# Patient Record
Sex: Female | Born: 1945
Health system: Southern US, Community
[De-identification: ages and names within clinical notes are randomized; demographics above are authoritative.]

## PROBLEM LIST (undated history)

## (undated) DIAGNOSIS — M4800 Spinal stenosis, site unspecified: Secondary | ICD-10-CM

## (undated) DIAGNOSIS — J349 Unspecified disorder of nose and nasal sinuses: Secondary | ICD-10-CM

## (undated) DIAGNOSIS — F419 Anxiety disorder, unspecified: Secondary | ICD-10-CM

## (undated) DIAGNOSIS — E119 Type 2 diabetes mellitus without complications: Secondary | ICD-10-CM

## (undated) DIAGNOSIS — R079 Chest pain, unspecified: Secondary | ICD-10-CM

## (undated) DIAGNOSIS — M797 Fibromyalgia: Secondary | ICD-10-CM

## (undated) DIAGNOSIS — F32A Depression, unspecified: Secondary | ICD-10-CM

## (undated) DIAGNOSIS — J449 Chronic obstructive pulmonary disease, unspecified: Secondary | ICD-10-CM

## (undated) DIAGNOSIS — I509 Heart failure, unspecified: Secondary | ICD-10-CM

## (undated) DIAGNOSIS — E0500 Thyrotoxicosis with diffuse goiter without thyrotoxic crisis or storm: Secondary | ICD-10-CM

## (undated) DIAGNOSIS — I10 Essential (primary) hypertension: Secondary | ICD-10-CM

## (undated) DIAGNOSIS — F4310 Post-traumatic stress disorder, unspecified: Secondary | ICD-10-CM

## (undated) DIAGNOSIS — K589 Irritable bowel syndrome without diarrhea: Secondary | ICD-10-CM

## (undated) DIAGNOSIS — F329 Major depressive disorder, single episode, unspecified: Secondary | ICD-10-CM

## (undated) HISTORY — DX: Major depressive disorder, single episode, unspecified: F32.9

## (undated) HISTORY — DX: Chest pain, unspecified: R07.9

## (undated) HISTORY — DX: Essential (primary) hypertension: I10

## (undated) HISTORY — DX: Spinal stenosis, site unspecified: M48.00

## (undated) HISTORY — DX: Heart failure, unspecified: I50.9

## (undated) HISTORY — DX: Post-traumatic stress disorder, unspecified: F43.10

## (undated) HISTORY — PX: TONSILLECTOMY AND ADENOIDECTOMY: SHX28

## (undated) HISTORY — DX: Depression, unspecified: F32.A

## (undated) HISTORY — DX: Irritable bowel syndrome, unspecified: K58.9

## (undated) HISTORY — DX: Anxiety disorder, unspecified: F41.9

## (undated) HISTORY — PX: ABDOMINAL HYSTERECTOMY: SHX81

## (undated) HISTORY — PX: RECTAL SURGERY: SHX760

## (undated) HISTORY — DX: Type 2 diabetes mellitus without complications: E11.9

## (undated) HISTORY — DX: Fibromyalgia: M79.7

## (undated) HISTORY — DX: Chronic obstructive pulmonary disease, unspecified: J44.9

## (undated) HISTORY — DX: Thyrotoxicosis with diffuse goiter without thyrotoxic crisis or storm: E05.00

## (undated) HISTORY — DX: Unspecified disorder of nose and nasal sinuses: J34.9

## (undated) HISTORY — PX: OTHER SURGICAL HISTORY: SHX169

---

## 2004-11-04 ENCOUNTER — Ambulatory Visit: Payer: Self-pay

## 2004-12-16 ENCOUNTER — Ambulatory Visit: Payer: Self-pay

## 2004-12-19 ENCOUNTER — Ambulatory Visit: Payer: Self-pay | Admitting: Gastroenterology

## 2004-12-21 ENCOUNTER — Emergency Department: Payer: Self-pay | Admitting: Emergency Medicine

## 2005-02-15 ENCOUNTER — Ambulatory Visit: Payer: Self-pay | Admitting: Internal Medicine

## 2005-04-28 ENCOUNTER — Ambulatory Visit: Payer: Self-pay | Admitting: Pain Medicine

## 2005-05-01 ENCOUNTER — Ambulatory Visit: Payer: Self-pay | Admitting: Pain Medicine

## 2005-05-04 ENCOUNTER — Ambulatory Visit: Payer: Self-pay | Admitting: Pain Medicine

## 2005-05-06 ENCOUNTER — Ambulatory Visit: Payer: Self-pay | Admitting: Pain Medicine

## 2005-05-25 ENCOUNTER — Ambulatory Visit: Payer: Self-pay | Admitting: Pain Medicine

## 2005-06-09 ENCOUNTER — Ambulatory Visit: Payer: Self-pay | Admitting: Internal Medicine

## 2005-06-23 ENCOUNTER — Ambulatory Visit: Payer: Self-pay | Admitting: Pain Medicine

## 2005-07-06 ENCOUNTER — Ambulatory Visit: Payer: Self-pay | Admitting: Pain Medicine

## 2005-07-21 ENCOUNTER — Ambulatory Visit: Payer: Self-pay | Admitting: Pain Medicine

## 2005-07-22 ENCOUNTER — Ambulatory Visit: Payer: Self-pay | Admitting: Pain Medicine

## 2005-08-18 ENCOUNTER — Ambulatory Visit: Payer: Self-pay | Admitting: Pain Medicine

## 2005-08-26 ENCOUNTER — Ambulatory Visit: Payer: Self-pay | Admitting: Pain Medicine

## 2005-09-03 ENCOUNTER — Encounter: Payer: Self-pay | Admitting: Internal Medicine

## 2005-09-15 ENCOUNTER — Encounter: Payer: Self-pay | Admitting: Internal Medicine

## 2005-09-17 ENCOUNTER — Ambulatory Visit: Payer: Self-pay | Admitting: Pain Medicine

## 2005-09-23 ENCOUNTER — Ambulatory Visit: Payer: Self-pay | Admitting: Pain Medicine

## 2005-10-13 ENCOUNTER — Ambulatory Visit: Payer: Self-pay | Admitting: Pain Medicine

## 2005-10-16 ENCOUNTER — Encounter: Payer: Self-pay | Admitting: Internal Medicine

## 2005-10-21 ENCOUNTER — Ambulatory Visit: Payer: Self-pay | Admitting: Pain Medicine

## 2005-10-27 ENCOUNTER — Ambulatory Visit: Payer: Self-pay

## 2005-10-28 ENCOUNTER — Ambulatory Visit: Payer: Self-pay | Admitting: Internal Medicine

## 2005-11-04 ENCOUNTER — Ambulatory Visit: Payer: Self-pay | Admitting: Internal Medicine

## 2005-11-10 ENCOUNTER — Ambulatory Visit: Payer: Self-pay | Admitting: Pain Medicine

## 2005-11-25 ENCOUNTER — Ambulatory Visit: Payer: Self-pay | Admitting: Pain Medicine

## 2005-12-03 ENCOUNTER — Ambulatory Visit: Payer: Self-pay

## 2005-12-16 ENCOUNTER — Ambulatory Visit: Payer: Self-pay

## 2005-12-29 ENCOUNTER — Ambulatory Visit: Payer: Self-pay | Admitting: Pain Medicine

## 2005-12-31 ENCOUNTER — Ambulatory Visit: Payer: Self-pay | Admitting: Internal Medicine

## 2006-01-11 ENCOUNTER — Ambulatory Visit: Payer: Self-pay | Admitting: Pain Medicine

## 2006-01-26 ENCOUNTER — Ambulatory Visit: Payer: Self-pay | Admitting: Pain Medicine

## 2006-02-03 ENCOUNTER — Ambulatory Visit: Payer: Self-pay | Admitting: Pain Medicine

## 2006-02-25 ENCOUNTER — Ambulatory Visit: Payer: Self-pay | Admitting: Pain Medicine

## 2006-03-08 ENCOUNTER — Ambulatory Visit: Payer: Self-pay | Admitting: Pain Medicine

## 2006-03-30 ENCOUNTER — Ambulatory Visit: Payer: Self-pay | Admitting: Pain Medicine

## 2006-04-12 ENCOUNTER — Ambulatory Visit: Payer: Self-pay | Admitting: Pain Medicine

## 2006-04-13 ENCOUNTER — Ambulatory Visit: Payer: Self-pay | Admitting: Gastroenterology

## 2006-04-27 ENCOUNTER — Ambulatory Visit: Payer: Self-pay | Admitting: Pain Medicine

## 2006-05-05 ENCOUNTER — Ambulatory Visit: Payer: Self-pay | Admitting: Pain Medicine

## 2006-05-27 ENCOUNTER — Ambulatory Visit: Payer: Self-pay | Admitting: Pain Medicine

## 2006-06-02 ENCOUNTER — Ambulatory Visit: Payer: Self-pay | Admitting: Pain Medicine

## 2006-06-24 ENCOUNTER — Ambulatory Visit: Payer: Self-pay | Admitting: Pain Medicine

## 2006-07-05 ENCOUNTER — Ambulatory Visit: Payer: Self-pay | Admitting: Pain Medicine

## 2006-07-13 ENCOUNTER — Ambulatory Visit: Payer: Self-pay | Admitting: Internal Medicine

## 2006-07-22 ENCOUNTER — Ambulatory Visit: Payer: Self-pay | Admitting: Pain Medicine

## 2006-08-02 ENCOUNTER — Ambulatory Visit: Payer: Self-pay | Admitting: Pain Medicine

## 2006-08-26 ENCOUNTER — Ambulatory Visit: Payer: Self-pay | Admitting: Pain Medicine

## 2006-09-01 ENCOUNTER — Ambulatory Visit: Payer: Self-pay | Admitting: Pain Medicine

## 2006-09-23 ENCOUNTER — Ambulatory Visit: Payer: Self-pay | Admitting: Pain Medicine

## 2006-10-06 ENCOUNTER — Emergency Department: Payer: Self-pay | Admitting: Emergency Medicine

## 2006-10-06 ENCOUNTER — Ambulatory Visit: Payer: Self-pay | Admitting: Pain Medicine

## 2006-10-06 ENCOUNTER — Other Ambulatory Visit: Payer: Self-pay

## 2006-10-20 ENCOUNTER — Ambulatory Visit: Payer: Self-pay | Admitting: Pain Medicine

## 2006-10-28 ENCOUNTER — Inpatient Hospital Stay: Payer: Self-pay | Admitting: Internal Medicine

## 2006-11-18 ENCOUNTER — Ambulatory Visit: Payer: Self-pay | Admitting: Pain Medicine

## 2006-11-22 ENCOUNTER — Ambulatory Visit: Payer: Self-pay | Admitting: Pain Medicine

## 2006-11-25 ENCOUNTER — Other Ambulatory Visit: Payer: Self-pay

## 2006-11-25 ENCOUNTER — Inpatient Hospital Stay: Payer: Self-pay | Admitting: Internal Medicine

## 2006-12-21 ENCOUNTER — Ambulatory Visit: Payer: Self-pay | Admitting: Pain Medicine

## 2006-12-29 ENCOUNTER — Ambulatory Visit: Payer: Self-pay | Admitting: Pain Medicine

## 2007-01-19 ENCOUNTER — Ambulatory Visit: Payer: Self-pay | Admitting: Pain Medicine

## 2007-02-14 ENCOUNTER — Ambulatory Visit: Payer: Self-pay | Admitting: Pain Medicine

## 2007-06-09 ENCOUNTER — Ambulatory Visit: Payer: Self-pay | Admitting: Pain Medicine

## 2007-06-20 ENCOUNTER — Ambulatory Visit: Payer: Self-pay | Admitting: Pain Medicine

## 2007-07-26 ENCOUNTER — Ambulatory Visit: Payer: Self-pay | Admitting: Pain Medicine

## 2007-08-01 ENCOUNTER — Ambulatory Visit: Payer: Self-pay | Admitting: Pain Medicine

## 2007-08-24 ENCOUNTER — Other Ambulatory Visit: Payer: Self-pay

## 2007-08-24 ENCOUNTER — Inpatient Hospital Stay: Payer: Self-pay | Admitting: Internal Medicine

## 2007-10-04 ENCOUNTER — Ambulatory Visit: Payer: Self-pay | Admitting: Pain Medicine

## 2007-10-17 ENCOUNTER — Ambulatory Visit: Payer: Self-pay | Admitting: Pain Medicine

## 2007-10-26 ENCOUNTER — Ambulatory Visit: Payer: Self-pay | Admitting: Pain Medicine

## 2007-11-02 ENCOUNTER — Ambulatory Visit: Payer: Self-pay | Admitting: Pain Medicine

## 2007-12-06 ENCOUNTER — Ambulatory Visit: Payer: Self-pay | Admitting: Pain Medicine

## 2007-12-29 ENCOUNTER — Ambulatory Visit: Payer: Self-pay | Admitting: Pain Medicine

## 2008-01-04 ENCOUNTER — Ambulatory Visit: Payer: Self-pay | Admitting: Pain Medicine

## 2008-01-31 ENCOUNTER — Ambulatory Visit: Payer: Self-pay | Admitting: Pain Medicine

## 2008-02-08 ENCOUNTER — Ambulatory Visit: Payer: Self-pay | Admitting: Pain Medicine

## 2008-03-01 ENCOUNTER — Ambulatory Visit: Payer: Self-pay | Admitting: Pain Medicine

## 2008-03-21 ENCOUNTER — Ambulatory Visit: Payer: Self-pay | Admitting: Pain Medicine

## 2008-04-03 ENCOUNTER — Emergency Department: Payer: Self-pay | Admitting: Emergency Medicine

## 2008-04-19 ENCOUNTER — Ambulatory Visit: Payer: Self-pay | Admitting: Pain Medicine

## 2008-04-25 ENCOUNTER — Ambulatory Visit: Payer: Self-pay | Admitting: Pain Medicine

## 2008-05-24 ENCOUNTER — Ambulatory Visit: Payer: Self-pay | Admitting: Pain Medicine

## 2008-05-30 ENCOUNTER — Ambulatory Visit: Payer: Self-pay | Admitting: Pain Medicine

## 2008-06-19 ENCOUNTER — Ambulatory Visit: Payer: Self-pay | Admitting: Pain Medicine

## 2008-07-04 ENCOUNTER — Ambulatory Visit: Payer: Self-pay | Admitting: Pain Medicine

## 2008-07-16 ENCOUNTER — Ambulatory Visit: Payer: Self-pay | Admitting: Pain Medicine

## 2008-08-22 ENCOUNTER — Ambulatory Visit: Payer: Self-pay | Admitting: Pain Medicine

## 2008-09-18 ENCOUNTER — Ambulatory Visit: Payer: Self-pay | Admitting: Pain Medicine

## 2008-09-24 ENCOUNTER — Ambulatory Visit: Payer: Self-pay | Admitting: Pain Medicine

## 2008-10-16 ENCOUNTER — Ambulatory Visit: Payer: Self-pay | Admitting: Pain Medicine

## 2008-11-15 ENCOUNTER — Ambulatory Visit: Payer: Self-pay | Admitting: Pain Medicine

## 2008-11-23 ENCOUNTER — Ambulatory Visit: Payer: Self-pay | Admitting: Internal Medicine

## 2008-12-11 ENCOUNTER — Ambulatory Visit: Payer: Self-pay | Admitting: Pain Medicine

## 2009-01-10 ENCOUNTER — Ambulatory Visit: Payer: Self-pay | Admitting: Pain Medicine

## 2009-01-14 ENCOUNTER — Ambulatory Visit: Payer: Self-pay | Admitting: Pain Medicine

## 2009-02-05 ENCOUNTER — Ambulatory Visit: Payer: Self-pay | Admitting: Pain Medicine

## 2009-02-08 ENCOUNTER — Emergency Department: Payer: Self-pay | Admitting: Emergency Medicine

## 2009-03-18 ENCOUNTER — Ambulatory Visit: Payer: Self-pay | Admitting: Pain Medicine

## 2009-03-27 ENCOUNTER — Ambulatory Visit: Payer: Self-pay | Admitting: Pain Medicine

## 2009-04-18 ENCOUNTER — Ambulatory Visit: Payer: Self-pay | Admitting: Pain Medicine

## 2009-05-16 ENCOUNTER — Ambulatory Visit: Payer: Self-pay | Admitting: Pain Medicine

## 2009-06-05 ENCOUNTER — Ambulatory Visit: Payer: Self-pay | Admitting: Pain Medicine

## 2009-06-17 ENCOUNTER — Ambulatory Visit: Payer: Self-pay | Admitting: Pain Medicine

## 2009-07-18 ENCOUNTER — Ambulatory Visit: Payer: Self-pay | Admitting: Pain Medicine

## 2009-08-15 ENCOUNTER — Ambulatory Visit: Payer: Self-pay | Admitting: Pain Medicine

## 2009-08-26 ENCOUNTER — Ambulatory Visit: Payer: Self-pay | Admitting: Pain Medicine

## 2009-09-12 ENCOUNTER — Ambulatory Visit: Payer: Self-pay | Admitting: Pain Medicine

## 2009-09-18 ENCOUNTER — Ambulatory Visit: Payer: Self-pay | Admitting: Pain Medicine

## 2009-10-10 ENCOUNTER — Ambulatory Visit: Payer: Self-pay | Admitting: Pain Medicine

## 2009-11-12 ENCOUNTER — Ambulatory Visit: Payer: Self-pay | Admitting: Pain Medicine

## 2009-11-25 ENCOUNTER — Ambulatory Visit: Payer: Self-pay | Admitting: Pain Medicine

## 2009-12-10 ENCOUNTER — Ambulatory Visit: Payer: Self-pay | Admitting: Pain Medicine

## 2010-01-09 ENCOUNTER — Ambulatory Visit: Payer: Self-pay | Admitting: Pain Medicine

## 2010-02-06 ENCOUNTER — Ambulatory Visit: Payer: Self-pay | Admitting: Pain Medicine

## 2010-02-12 ENCOUNTER — Ambulatory Visit: Payer: Self-pay | Admitting: Pain Medicine

## 2010-02-17 ENCOUNTER — Ambulatory Visit: Payer: Self-pay | Admitting: Pain Medicine

## 2010-03-06 ENCOUNTER — Ambulatory Visit: Payer: Self-pay | Admitting: Pain Medicine

## 2010-03-12 ENCOUNTER — Ambulatory Visit: Payer: Self-pay | Admitting: Pain Medicine

## 2010-04-08 ENCOUNTER — Ambulatory Visit: Payer: Self-pay | Admitting: Pain Medicine

## 2010-05-06 ENCOUNTER — Ambulatory Visit: Payer: Self-pay | Admitting: Pain Medicine

## 2010-06-05 ENCOUNTER — Ambulatory Visit: Payer: Self-pay | Admitting: Pain Medicine

## 2010-07-08 ENCOUNTER — Ambulatory Visit: Payer: Self-pay | Admitting: Pain Medicine

## 2010-08-05 ENCOUNTER — Ambulatory Visit: Payer: Self-pay | Admitting: Pain Medicine

## 2010-08-27 ENCOUNTER — Ambulatory Visit: Payer: Self-pay | Admitting: Pain Medicine

## 2010-09-04 ENCOUNTER — Ambulatory Visit: Payer: Self-pay | Admitting: Pain Medicine

## 2010-09-19 ENCOUNTER — Ambulatory Visit: Payer: Self-pay | Admitting: Internal Medicine

## 2010-10-06 ENCOUNTER — Ambulatory Visit: Payer: Self-pay | Admitting: Pain Medicine

## 2010-11-04 ENCOUNTER — Ambulatory Visit: Payer: Self-pay | Admitting: Pain Medicine

## 2010-12-03 ENCOUNTER — Ambulatory Visit: Payer: Self-pay | Admitting: Pain Medicine

## 2011-01-01 ENCOUNTER — Ambulatory Visit: Payer: Self-pay | Admitting: Pain Medicine

## 2011-02-03 ENCOUNTER — Ambulatory Visit: Payer: Self-pay | Admitting: Pain Medicine

## 2011-03-05 ENCOUNTER — Ambulatory Visit: Payer: Self-pay | Admitting: Pain Medicine

## 2011-03-16 ENCOUNTER — Ambulatory Visit: Payer: Self-pay | Admitting: Pain Medicine

## 2011-04-02 ENCOUNTER — Ambulatory Visit: Payer: Self-pay | Admitting: Pain Medicine

## 2011-04-23 ENCOUNTER — Ambulatory Visit: Payer: Self-pay | Admitting: Pain Medicine

## 2011-06-02 ENCOUNTER — Ambulatory Visit: Payer: Self-pay | Admitting: Pain Medicine

## 2011-06-08 ENCOUNTER — Ambulatory Visit: Payer: Self-pay | Admitting: Pain Medicine

## 2011-06-30 ENCOUNTER — Ambulatory Visit: Payer: Self-pay | Admitting: Pain Medicine

## 2011-07-30 ENCOUNTER — Ambulatory Visit: Payer: Self-pay | Admitting: Pain Medicine

## 2011-09-01 ENCOUNTER — Ambulatory Visit: Payer: Self-pay | Admitting: Pain Medicine

## 2011-09-02 ENCOUNTER — Ambulatory Visit: Payer: Self-pay | Admitting: Internal Medicine

## 2011-10-01 ENCOUNTER — Ambulatory Visit: Payer: Self-pay | Admitting: Pain Medicine

## 2011-10-14 ENCOUNTER — Ambulatory Visit: Payer: Self-pay | Admitting: Pain Medicine

## 2011-11-03 ENCOUNTER — Ambulatory Visit: Payer: Self-pay | Admitting: Pain Medicine

## 2011-12-03 ENCOUNTER — Ambulatory Visit: Payer: Self-pay | Admitting: Pain Medicine

## 2011-12-31 ENCOUNTER — Ambulatory Visit: Payer: Self-pay | Admitting: Pain Medicine

## 2012-01-28 ENCOUNTER — Ambulatory Visit: Payer: Self-pay | Admitting: Pain Medicine

## 2012-02-17 ENCOUNTER — Ambulatory Visit: Payer: Self-pay | Admitting: Pain Medicine

## 2012-03-01 ENCOUNTER — Ambulatory Visit: Payer: Self-pay | Admitting: Pain Medicine

## 2012-03-14 ENCOUNTER — Ambulatory Visit: Payer: Self-pay | Admitting: Pain Medicine

## 2012-03-31 ENCOUNTER — Ambulatory Visit: Payer: Self-pay | Admitting: Pain Medicine

## 2012-05-03 ENCOUNTER — Ambulatory Visit: Payer: Self-pay | Admitting: Pain Medicine

## 2012-05-12 LAB — COMPREHENSIVE METABOLIC PANEL
Albumin: 3.5 g/dL (ref 3.4–5.0)
Anion Gap: 5 — ABNORMAL LOW (ref 7–16)
Bilirubin,Total: 0.1 mg/dL — ABNORMAL LOW (ref 0.2–1.0)
Calcium, Total: 8.4 mg/dL — ABNORMAL LOW (ref 8.5–10.1)
Creatinine: 0.91 mg/dL (ref 0.60–1.30)
EGFR (African American): >60
Glucose: 94 mg/dL (ref 65–99)
Osmolality: 276 (ref 275–301)
SGOT(AST): 25 U/L (ref 15–37)

## 2012-05-12 LAB — CBC
MCH: 31.6 pg (ref 26.0–34.0)
MCV: 96 fL (ref 80–100)
Platelet: 119 10*3/uL — ABNORMAL LOW (ref 150–440)
RDW: 14 % (ref 11.5–14.5)
WBC: 4.7 10*3/uL (ref 3.6–11.0)

## 2012-05-13 ENCOUNTER — Inpatient Hospital Stay: Payer: Self-pay | Admitting: Internal Medicine

## 2012-05-13 LAB — URINALYSIS, COMPLETE
Bilirubin,UR: NEGATIVE
Nitrite: NEGATIVE
Ph: 5 (ref 4.5–8.0)
Protein: NEGATIVE
RBC,UR: 1 /HPF (ref 0–5)
Specific Gravity: 1.024 (ref 1.003–1.030)

## 2012-05-14 LAB — CBC WITH DIFFERENTIAL/PLATELET
Basophil %: 0.1 %
Eosinophil %: 0 %
Lymphocyte #: 0.6 10*3/uL — ABNORMAL LOW (ref 1.0–3.6)
MCH: 34.1 pg — ABNORMAL HIGH (ref 26.0–34.0)
MCV: 94 fL (ref 80–100)
Monocyte #: 0.3 x10 3/mm (ref 0.2–0.9)
Monocyte %: 6 %
Neutrophil #: 3.7 10*3/uL (ref 1.4–6.5)
Platelet: 119 10*3/uL — ABNORMAL LOW (ref 150–440)
WBC: 4.6 10*3/uL (ref 3.6–11.0)

## 2012-05-14 LAB — BASIC METABOLIC PANEL
Anion Gap: 8 (ref 7–16)
Calcium, Total: 9 mg/dL (ref 8.5–10.1)
Chloride: 104 mmol/L (ref 98–107)
Co2: 25 mmol/L (ref 21–32)
EGFR (African American): >60

## 2012-05-17 LAB — BASIC METABOLIC PANEL
Calcium, Total: 8.9 mg/dL (ref 8.5–10.1)
Chloride: 104 mmol/L (ref 98–107)
Co2: 25 mmol/L (ref 21–32)
Creatinine: 0.98 mg/dL (ref 0.60–1.30)
Osmolality: 279 (ref 275–301)
Potassium: 3.5 mmol/L (ref 3.5–5.1)
Sodium: 137 mmol/L (ref 136–145)

## 2012-05-18 LAB — PLATELET COUNT: Platelet: 182 10*3/uL (ref 150–440)

## 2012-06-07 ENCOUNTER — Ambulatory Visit: Payer: Self-pay | Admitting: Pain Medicine

## 2012-07-07 ENCOUNTER — Ambulatory Visit: Payer: Self-pay | Admitting: Pain Medicine

## 2012-07-20 ENCOUNTER — Ambulatory Visit: Payer: Self-pay | Admitting: Pain Medicine

## 2012-08-04 ENCOUNTER — Ambulatory Visit: Payer: Self-pay | Admitting: Pain Medicine

## 2012-08-30 ENCOUNTER — Ambulatory Visit: Payer: Self-pay | Admitting: Pain Medicine

## 2012-09-29 ENCOUNTER — Ambulatory Visit: Payer: Self-pay | Admitting: Pain Medicine

## 2012-11-01 ENCOUNTER — Ambulatory Visit: Payer: Self-pay | Admitting: Pain Medicine

## 2012-11-29 ENCOUNTER — Ambulatory Visit: Payer: Self-pay | Admitting: Pain Medicine

## 2012-12-27 ENCOUNTER — Ambulatory Visit: Payer: Self-pay | Admitting: Pain Medicine

## 2013-01-31 ENCOUNTER — Ambulatory Visit: Payer: Self-pay | Admitting: Pain Medicine

## 2013-03-02 ENCOUNTER — Ambulatory Visit: Payer: Self-pay | Admitting: Pain Medicine

## 2013-03-30 ENCOUNTER — Ambulatory Visit: Payer: Self-pay | Admitting: Pain Medicine

## 2013-04-26 ENCOUNTER — Ambulatory Visit: Payer: Self-pay | Admitting: Pain Medicine

## 2013-04-26 ENCOUNTER — Ambulatory Visit: Payer: Self-pay | Admitting: Podiatry

## 2013-04-27 ENCOUNTER — Encounter: Payer: Self-pay | Admitting: Podiatry

## 2013-05-01 ENCOUNTER — Encounter: Payer: Self-pay | Admitting: Podiatry

## 2013-05-01 ENCOUNTER — Ambulatory Visit: Payer: Medicare Other | Admitting: Podiatry

## 2013-05-01 ENCOUNTER — Ambulatory Visit (INDEPENDENT_AMBULATORY_CARE_PROVIDER_SITE_OTHER): Payer: Medicare Other

## 2013-05-01 VITALS — BP 120/77 | HR 65 | Resp 16 | Ht 66.0 in | Wt 136.0 lb

## 2013-05-01 DIAGNOSIS — M79609 Pain in unspecified limb: Secondary | ICD-10-CM

## 2013-05-01 DIAGNOSIS — M722 Plantar fascial fibromatosis: Secondary | ICD-10-CM

## 2013-05-01 DIAGNOSIS — M79672 Pain in left foot: Secondary | ICD-10-CM

## 2013-05-01 NOTE — Progress Notes (Signed)
   Subjective:    Patient ID: [name], female    DOB: 12/24/1945, 67 y.o.   MRN: [id number]  HPI Comments: Took a fall two weeks ago and mainly its the left plantar heel   Foot Injury       Review of Systems     Objective:   Physical Exam: Pulses are palpable. Graphic evaluation does not demonstrate any type of osseous abnormalities. It does demonstrate a soft tissue increase in density at the plantar fascial calcaneal insertion site indicative of plantar fasciitis. She has pain on palpation medial continued tubercle right heel.        Assessment & Plan:  Assessment: Plantar fasciitis right.  Plan: Injection point of maximal tenderness today with Kenalog and local anesthetic to the right heel. Followup with her as needed.

## 2013-05-30 ENCOUNTER — Ambulatory Visit: Payer: Self-pay | Admitting: Pain Medicine

## 2013-06-07 ENCOUNTER — Ambulatory Visit: Payer: Self-pay | Admitting: Pain Medicine

## 2013-06-29 ENCOUNTER — Ambulatory Visit: Payer: Self-pay | Admitting: Pain Medicine

## 2013-07-27 ENCOUNTER — Ambulatory Visit: Payer: Self-pay | Admitting: Pain Medicine

## 2013-08-24 ENCOUNTER — Ambulatory Visit: Payer: Self-pay | Admitting: Pain Medicine

## 2013-09-21 ENCOUNTER — Ambulatory Visit: Payer: Self-pay | Admitting: Pain Medicine

## 2013-10-25 ENCOUNTER — Ambulatory Visit: Payer: Self-pay | Admitting: Pain Medicine

## 2013-11-15 ENCOUNTER — Ambulatory Visit: Payer: Self-pay | Admitting: Pain Medicine

## 2013-11-23 ENCOUNTER — Ambulatory Visit: Payer: Self-pay | Admitting: Pain Medicine

## 2013-12-07 ENCOUNTER — Ambulatory Visit: Payer: Self-pay | Admitting: Gastroenterology

## 2013-12-08 LAB — PATHOLOGY REPORT

## 2013-12-13 ENCOUNTER — Ambulatory Visit: Payer: Self-pay | Admitting: Pain Medicine

## 2013-12-26 ENCOUNTER — Ambulatory Visit: Payer: Self-pay | Admitting: Pain Medicine

## 2013-12-27 ENCOUNTER — Emergency Department: Payer: Self-pay | Admitting: Emergency Medicine

## 2013-12-27 LAB — COMPREHENSIVE METABOLIC PANEL
ALK PHOS: 96 U/L
AST: 11 U/L — AB (ref 15–37)
Albumin: 3.4 g/dL (ref 3.4–5.0)
Anion Gap: 11 (ref 7–16)
BUN: 12 mg/dL (ref 7–18)
Bilirubin,Total: 0.1 mg/dL — ABNORMAL LOW (ref 0.2–1.0)
Calcium, Total: 8.8 mg/dL (ref 8.5–10.1)
Chloride: 103 mmol/L (ref 98–107)
Co2: 24 mmol/L (ref 21–32)
Creatinine: 0.93 mg/dL (ref 0.60–1.30)
Glucose: 109 mg/dL — ABNORMAL HIGH (ref 65–99)
Osmolality: 276 (ref 275–301)
Potassium: 3.5 mmol/L (ref 3.5–5.1)
SGPT (ALT): 10 U/L — ABNORMAL LOW
SODIUM: 138 mmol/L (ref 136–145)
TOTAL PROTEIN: 6.5 g/dL (ref 6.4–8.2)

## 2013-12-27 LAB — URINALYSIS, COMPLETE
BLOOD: NEGATIVE
Bacteria: NONE SEEN
Bilirubin,UR: NEGATIVE
Glucose,UR: NEGATIVE mg/dL (ref 0–75)
Ketone: NEGATIVE
Nitrite: NEGATIVE
PROTEIN: NEGATIVE
Ph: 5 (ref 4.5–8.0)
RBC,UR: <1 /HPF (ref 0–5)
SPECIFIC GRAVITY: 1.009 (ref 1.003–1.030)
Squamous Epithelial: 11
WBC UR: 4 /HPF (ref 0–5)

## 2013-12-27 LAB — CBC
HCT: 40.5 % (ref 35.0–47.0)
HGB: 13 g/dL (ref 12.0–16.0)
MCH: 32.6 pg (ref 26.0–34.0)
MCHC: 32.1 g/dL (ref 32.0–36.0)
MCV: 102 fL — ABNORMAL HIGH (ref 80–100)
Platelet: 239 10*3/uL (ref 150–440)
RBC: 3.98 10*6/uL (ref 3.80–5.20)
RDW: 14 % (ref 11.5–14.5)
WBC: 11.2 10*3/uL — AB (ref 3.6–11.0)

## 2014-01-25 ENCOUNTER — Ambulatory Visit: Payer: Self-pay | Admitting: Pain Medicine

## 2014-02-12 ENCOUNTER — Ambulatory Visit: Payer: Self-pay | Admitting: Pain Medicine

## 2014-02-22 ENCOUNTER — Ambulatory Visit: Payer: Self-pay | Admitting: Pain Medicine

## 2014-03-22 ENCOUNTER — Ambulatory Visit: Payer: Self-pay | Admitting: Pain Medicine

## 2014-04-24 ENCOUNTER — Ambulatory Visit: Payer: Self-pay | Admitting: Pain Medicine

## 2014-05-22 ENCOUNTER — Ambulatory Visit: Payer: Self-pay | Admitting: Pain Medicine

## 2014-06-13 ENCOUNTER — Ambulatory Visit: Payer: Self-pay | Admitting: Pain Medicine

## 2014-07-12 ENCOUNTER — Ambulatory Visit: Payer: Self-pay | Admitting: Pain Medicine

## 2014-08-14 ENCOUNTER — Ambulatory Visit: Admit: 2014-08-14 | Disposition: A | Discharge status: Home / Self Care | Payer: Self-pay | Admitting: Pain Medicine

## 2014-08-21 DIAGNOSIS — F4310 Post-traumatic stress disorder, unspecified: Secondary | ICD-10-CM | POA: Insufficient documentation

## 2014-08-21 DIAGNOSIS — M4800 Spinal stenosis, site unspecified: Secondary | ICD-10-CM | POA: Insufficient documentation

## 2014-09-04 NOTE — H&P (Signed)
PATIENT NAME:  [name], [name] MR#:  [id number] DATE OF BIRTH:  1945-11-08  DATE OF ADMISSION:  05/13/2012  PRIMARY CARE PHYSICIAN: Corky Downs, MD  REFERRING PHYSICIAN: Bayard Males, MD  CHIEF COMPLAINT: Shortness of breath and wheezing.   HISTORY OF PRESENT ILLNESS: This is a 69 year old female who presents with complaint of shortness of breath, cough, nonproductive sputum and wheezing. The patient is known to have history of COPD, on home oxygen 2 liters nasal cannula. The patient reports over the last two days she has been complaining of significant shortness of breath and wheezing and feels like she is having cough, but it is nonproductive.  She feels she is congested, but cannot produce any sputum. Chest x-ray does not show any infiltrate. The patient had significant wheezing upon presentation that did not improve with Solu-Medrol or multiple nebulizer treatments so the hospitalist service was requested to admit the patient for further management of her COPD. The patient complains of fever and feeling febrile at home, but she was afebrile here in the ED. Currently she is requiring 4 liters nasal cannula. She denies any chest pain, palpitations, syncope, nausea, vomiting, coffee-ground emesis, diarrhea, constipation or abdominal pain.   PAST MEDICAL HISTORY: 1. COPD and chronic respiratory failure, on 2 liters nasal cannula at home.  2. Hypertension.  3. Diabetes mellitus.  4. Hyperlipidemia.  5. Graves' disease status post radiation, currently hypothyroid.  6. Hypothyroidism.  7. Diabetes mellitus.  8.   history of  chronic back pain.   FAMILY HISTORY: No family history of early onset cardiovascular disease.   SOCIAL HISTORY: Denies any alcohol. Did quit smoking before 4 months.   ALLERGIES: No known drug allergies.   HOME MEDICATIONS: 1. Topamax 25 mg 3 to 5 tablets daily.  2. Synthroid 100 mcg oral daily. 3. Phenergan 1 tablet oral daily as needed.  4. Omega-3 one capsule  daily.  5. Sublingual nitroglycerin 0.4 mg as needed for chest pain.  6. Metformin 500 mg oral daily.  7. Lidoderm 0.5% patch apply 1 to 2 patches to effected area, 12 hours on and 12 hours off. 8. Potassium 20 milliequivalents oral daily. 9. Cymbalta 120 mg oral daily. 10. Crestor 10 mg at bedtime. 11. Clonazepam 1 mg 3 times a day. 12. Atenolol 50 mg 2 times a day. 13. Aspirin 81 mg daily.  14. Antivert 25 mg as needed. 15. Ambien 10 mg at night. 16. Advair 500/50 two puffs 2 times a day. 17. Tylenol with oxycodone 325/7.5 mg 3 to 5 tablets daily as needed.   REVIEW OF SYSTEMS:  CONSTITUTIONAL: The patient complains of fever and chills. As well complains of fatigue and weakness.   EYES: Denies blurry vision, double vision or pain.   ENT: Denies tinnitus, ear pain or hearing loss.   RESPIRATORY: Complains of cough, wheezing and dyspnea. Denies any hemoptysis or asthma.   CARDIOVASCULAR: Denies chest pain, orthopnea, edema, arrhythmia, palpitations or syncope.   GASTROINTESTINAL: Denies nausea, vomiting, diarrhea, abdominal pain, hematemesis or melena.   GENITOURINARY: Denies dysuria, hematuria or renal colic.   ENDOCRINE: Denies polyuria, polydipsia or heat or cold intolerance. Had initially Grave's disease status post radiation, currently hypothyroid.   HEMATOLOGY: Denies any anemia, easy bruising or bleeding diathesis.   INTEGUMENT: Denies acne, rash or skin lesions.   MUSCULOSKELETAL: Complains of chronic lower back pain. Denies any cramps, gout or swelling.   NEUROLOGICAL: Denies dysarthria, epilepsy, tremors, vertigo or ataxia.   PSYCHIATRIC: Has complaints of insomnia, anxiety, nervousness and  depression. No history of substance or alcohol abuse.   PHYSICAL EXAMINATION:  VITAL SIGNS: Temperature 98.9, pulse 84, respiratory rate 18, blood pressure 149/91 and saturating 98% on 4 liters nasal cannula.   GENERAL: Elderly female who looks comfortable and in no  apparent distress.   HEENT: Head atraumatic, normocephalic. Pupils equal and reactive to light. Pink conjunctivae. Anicteric sclerae. Moist oral mucosa.   NECK: Supple. No thyromegaly. No JVD.   CHEST: Decreased air entry bilaterally with diffuse wheezing and rales.   CARDIOVASCULAR: S1 and S2 heard. No rubs, murmurs or gallops.   ABDOMEN: Soft, nontender and nondistended. Bowel sounds present.   EXTREMITIES: No edema. No clubbing. No cyanosis.   PSYCHIATRIC: Appropriate affect. Awake and alert x3. Intact judgment and insight.  NEUROLOGIC: Cranial nerves II through XII grossly intact. Motor 5 out of 5.   SKIN: Normal skin turgor. Warm and dry.   PERTINENT LABS: Glucose 94, BUN 10, creatinine 0.91, sodium 139, potassium 3, chloride 105 and CO2 29. White blood cells 4.7, hemoglobin 13.1, hematocrit 39.8 and platelets 119.  Urinalysis is negative.   RADIOLOGIC STUDIES: Chest x-ray does not show any infiltrate.   ASSESSMENT AND PLAN: 1. COPD examination. The patient is known to have history of COPD and is on 2 liters nasal cannula currently requiring 4 liters nasal cannula and complaining of cough, nonproductive. The patient will be started on IV Solu-Medrol 80 every 8 hours. We will give PPI and large dose of steroids. As well we will give DuoNebs every 4 hours and albuterol every 2 hours as needed. We will continue her on her home Advair two puffs 2 times a day and we will taper her Solu-Medrol gradually as tolerated. Given the fact she is feeling congested with cough, we will start her on IV Levaquin as well.  2. Hypokalemia. We will replace.  3. Hypothyroidism. We will continue the patient on Synthroid.  4. Diabetes mellitus. We will hold metformin. We will continue insulin sliding scale.  5. Hypertension. Acceptable. We will continue home medication. 6. Hyperlipidemia. We will continue with Crestor. 7. Chronic back pain. We will resume home medication.  8. DVT prophylaxis.  Subcutaneous heparin.  9. GI prophylaxis. Protonix.  CODE STATUS: FULL CODE.   TOTAL TIME SPENT ON ADMISSION AND PATIENT CARE: 55 minutes.  ____________________________ Starleen Armsawood S. Doriana Mazurkiewicz, MD dse:sb D: 05/13/2012 06:53:01 ET     T: 05/13/2012 08:24:25 ET       JOB#: [id number] cc: Starleen Armsawood S. Antoine Vandermeulen, MD, <Dictator> Crystalee Ventress Teena IraniS Charde Macfarlane MD ELECTRONICALLY SIGNED 05/14/2012 0:41

## 2014-09-07 NOTE — Discharge Summary (Signed)
PATIENT NAME:  [name], [name]#:  [id number] DATE OF BIRTH:  July 21, 1945  DATE OF ADMISSION:  05/13/2012 DATE OF DISCHARGE:  05/19/2012  PRESENTING COMPLAINT: Shortness of breath.   DISCHARGE DIAGNOSES:  1. Acute on chronic obstructive pulmonary disease exacerbation. Respiratory failure due to chronic obstructive pulmonary disease exacerbation.  2. Chronic oxygen use.  3. Fibromyalgia.  4. Hypothyroidism.   CODE STATUS: Full code.   DISCHARGE MEDICATIONS: 1. Aspirin 81 mg daily.  2. Atenolol 50 mg b.i.d.  3. Crestor 10 mg daily.  4. Metformin 500 mg every other day.  5. Omega-3 fatty acid 1 tablet p.o. daily.  6. Advair 500/50, two puffs b.i.d.  7. Phenergan 25 mg as needed.  8. Nitroglycerin 0.4 mg sublingual p.r.n.  9. Cymbalta 60 mg extended release 2 tablets daily.  10 phenergan 25 mg 1 tablet daily as needed.  11. Synthroid 100 mcg p.o. daily.  12. Ambien 10 mg at bedtime.  13. Acetaminophen/oxycodone 325/7.5 mg one to two tablets q.4 to 6 p.r.n.  14. Lidoderm patch 5% patch, apply to skin for 12 hours and remove for 12 hours as needed.  15. Clonazepam 1 mg 3 times a day.  16. Prednisone 10 mg, start at 50 mg taper x 10 until complete.  17. Levaquin 500 mg p.o. daily  18. Topamax 100 mg p.o. daily.  19. Spiriva 18 mcg inhalation once a day.  20. Home oxygen 2 liters nasal cannula as before.   FOLLOWUP:  1. Follow up with Dr. Juel BurrowMasoud  in 1 to 2 weeks.  2. Home health R.N.   LABORATORY DATA: At discharge, glucose is 169. The rest of the chemistry within normal limits. Echo Doppler: There is moderate asymmetric left ventricular hypertrophy, diastolic dysfunction is noted, right ventricular dysfunction, right ventricular systolic function is normal. Right ventricular wall thickness is normal. The mitral valve leaflets appear thickened, but open well. White count is 4.6, H and H are 12.8 and 35.6. Platelet count is 182. Urine negative for UTI. Chest x-ray: No acute  cardiopulmonary abnormality. Findings consistent with COPD.  HISTORY OF PRESENT ILLNESS: The patient is a 69 year old Caucasian female with history of chronic COPD on home oxygen, comes in with:  1. Acute COPD exacerbation with acute bronchitis. The patient was started on IV Solu-Medrol, had a slow recovery, changed to p.o. prednisone. Her DuoNebs and Levaquin were continued. The patient was also on Advair and Spiriva. She was continued on her oxygen as before. A chest x-ray did not show any acute changes other than changes consistent with COPD. Echo was essentially normal.  2. Hypokalemia: Replaced p.o. potassium.  3. Hypothyroidism: After treatment for Graves' disease in the past, the patient was on Synthroid, this discontinued.  4. Chronic pain with fibromyalgia: Continued Cymbalta, Lidoderm patch, and Norco p.r.n.  5. History of migraines: The patient is on Topamax.  6. Type 2 diabetes: Sliding scale and metformin were continued.  7. Generalized weakness: The patient walked with PT yesterday, recommended outpatient vestibular services given symptoms of dizziness.   Hospital stay otherwise remained stable. The patient remained a full code.   TIME SPENT: 40 minutes.     ____________________________ Wylie HailSona A. Allena KatzPatel, MD sap:es D: 05/20/2012 07:07:49 ET T: 05/20/2012 08:25:09 ET JOB#: [id number]  cc: Cecila Satcher A. Allena KatzPatel, MD, <Dictator> Corky DownsJaved Masoud, MD Willow OraSONA A Dorie Ohms MD ELECTRONICALLY SIGNED 05/22/2012 11:37

## 2014-09-18 ENCOUNTER — Encounter: Payer: Self-pay | Admitting: Pain Medicine

## 2014-10-08 DIAGNOSIS — F4310 Post-traumatic stress disorder, unspecified: Secondary | ICD-10-CM | POA: Insufficient documentation

## 2014-10-18 ENCOUNTER — Encounter: Payer: Self-pay | Admitting: Pain Medicine

## 2014-10-18 ENCOUNTER — Ambulatory Visit: Payer: Medicare HMO | Attending: Pain Medicine | Admitting: Pain Medicine

## 2014-10-18 ENCOUNTER — Encounter (INDEPENDENT_AMBULATORY_CARE_PROVIDER_SITE_OTHER): Payer: Self-pay

## 2014-10-18 VITALS — BP 110/71 | HR 61 | Temp 98.4°F | Resp 18 | Ht 66.0 in | Wt 133.0 lb

## 2014-10-18 DIAGNOSIS — G43109 Migraine with aura, not intractable, without status migrainosus: Secondary | ICD-10-CM

## 2014-10-18 DIAGNOSIS — M546 Pain in thoracic spine: Secondary | ICD-10-CM | POA: Diagnosis present

## 2014-10-18 DIAGNOSIS — M51369 Other intervertebral disc degeneration, lumbar region without mention of lumbar back pain or lower extremity pain: Secondary | ICD-10-CM

## 2014-10-18 DIAGNOSIS — M47816 Spondylosis without myelopathy or radiculopathy, lumbar region: Secondary | ICD-10-CM

## 2014-10-18 DIAGNOSIS — M5136 Other intervertebral disc degeneration, lumbar region: Secondary | ICD-10-CM | POA: Insufficient documentation

## 2014-10-18 DIAGNOSIS — M5481 Occipital neuralgia: Secondary | ICD-10-CM

## 2014-10-18 DIAGNOSIS — M542 Cervicalgia: Secondary | ICD-10-CM | POA: Diagnosis present

## 2014-10-18 DIAGNOSIS — M7060 Trochanteric bursitis, unspecified hip: Secondary | ICD-10-CM | POA: Insufficient documentation

## 2014-10-18 DIAGNOSIS — M533 Sacrococcygeal disorders, not elsewhere classified: Secondary | ICD-10-CM

## 2014-10-18 DIAGNOSIS — G43909 Migraine, unspecified, not intractable, without status migrainosus: Secondary | ICD-10-CM | POA: Diagnosis not present

## 2014-10-18 DIAGNOSIS — M47812 Spondylosis without myelopathy or radiculopathy, cervical region: Secondary | ICD-10-CM | POA: Insufficient documentation

## 2014-10-18 MED ORDER — TOPIRAMATE 25 MG PO TABS: ORAL_TABLET | ORAL | Status: DC

## 2014-10-18 MED ORDER — OXYCODONE-ACETAMINOPHEN 7.5-325 MG PO TABS: ORAL_TABLET | ORAL | Status: DC

## 2014-10-18 NOTE — Progress Notes (Signed)
   Subjective:    Patient ID: [name], female    DOB: [birth year], 69 y.o.   MRN: [id number]  HPI  Patient is 69 year old female [facility] for further evaluation and treatment of pain involving the neck and entire back upper and lower extremity regions. Patient also with headaches. Patient denies any change in events of daily living the cause change in symptomatology. Patient attributes her symptoms to the damp weather. We will continue medications and avoid interventional treatment at this time. Patient to call [facility] to consider interventional treatment and other modification of treatment regimen should there be change in condition prior to scheduled return appointment. The patient is understanding and agrees with suggested treatment plan.    Review of Systems     Objective:   Physical Exam  There was tenderness of the splenius capitis and occipitalis musculature region of moderate degree.. There were no newly  performed lesions of the head and neck noted. There was unremarkable Spurling's maneuver. Minimal tennis to palpation was noted of the acromioclavicular and glenohumeral joint regions. Appeared to be with bilaterally equal grip strength and Tinel and Phalen's maneuver were without increased pain of any significant degree. Visual thoracic facet thoracic paraspinal musculature region was associated with moderate discomfort to moderate muscle spasms with spasms of the cervical region as well of moderate degree. Palpation over the lumbar paraspinal musculature region lumbar facet region associated with moderate tends to palpation with extension and palpation of the lumbar facets reproducing moderate discomfort. Raising limited to approximately 30 without increase of pain with dorsiflexion noted. There was negative clonus negative Homans. DTRs appear to be trace at knees. No sensory deficit of dermatomal discrete detected. Negative clonus negative  Homans. Abdomen nontender. No costovertebral tenderness noted.      Assessment & Plan:    Degenerative disc disease lumbar spine Degenerative changes lumbar spine L4-L5 with L3-4 level involvement predominantly  Lumbar facet syndrome  Sacroiliac joint dysfunction  Bilateral greater occipital neuralgia  Migraine headaches  Greater trochanteric bursitis    Plan   Continue present medications.  F/U PCP for evaliation of  BP and general medical  condition.  F/U surgical evaluation.  F/U neurological evaluation.  May consider radiofrequency rhizolysis or intraspinal procedures pending response to present treatment and F/U evaluation.  Patient to call [facility] should patient have concerns prior to scheduled return appointment.

## 2014-10-18 NOTE — Progress Notes (Signed)
Patient given scripts as ordered.teachback 3 done. Discharged to home ambulatory.

## 2014-10-18 NOTE — Patient Instructions (Addendum)
Continue present medications. Hydrocodone acetaminophen and Topamax   F/U PCP for evaliation of  BP and general medical  condition.  F/U surgical evaluation.  F/U neurological evaluation.  May consider radiofrequency rhizolysis or intraspinal procedures pending response to present treatment and F/U evaluation.  Patient to call [facility] should patient have concerns prior to scheduled return appointment.Pain Management Discharge Instructions  General Discharge Instructions :  If you need to reach your doctor call: Monday-Friday 8:00 am - 4:00 pm at [phone] or toll free [phone].  After clinic hours [phone] to have operator reach doctor.  Bring all of your medication bottles to all your appointments in the pain clinic.  To cancel or reschedule your appointment with Pain Management please remember to call 24 hours in advance to avoid a fee.  Refer to the educational materials which you have been given on: General Risks, I had my Procedure. Discharge Instructions, Post Sedation.  Post Procedure Instructions:  The drugs you were given will stay in your system until tomorrow, so for the next 24 hours you should not drive, make any legal decisions or drink any alcoholic beverages.  You may eat anything you prefer, but it is better to start with liquids then soups and crackers, and gradually work up to solid foods.  Please notify your doctor immediately if you have any unusual bleeding, trouble breathing or pain that is not related to your normal pain.  Depending on the type of procedure that was done, some parts of your body may feel week and/or numb.  This usually clears up by tonight or the next day.  Walk with the use of an assistive device or accompanied by an adult for the 24 hours.  You may use ice on the affected area for the first 24 hours.  Put ice in a Ziploc bag and cover with a towel and place against area 15 minutes on 15 minutes off.  You may  switch to heat after 24 hours.

## 2014-10-24 ENCOUNTER — Other Ambulatory Visit: Payer: Self-pay | Admitting: Pain Medicine

## 2014-10-25 ENCOUNTER — Other Ambulatory Visit: Payer: Self-pay

## 2014-10-25 DIAGNOSIS — F322 Major depressive disorder, single episode, severe without psychotic features: Secondary | ICD-10-CM

## 2014-10-25 NOTE — Telephone Encounter (Signed)
pt also needs clonazepam 1 mg  take one tablet 3 times daily as needed

## 2014-10-25 NOTE — Telephone Encounter (Signed)
rx refill request sent on pt for zolpidem tartrate 10mg  take one tablet at bedtime.  pt was last seen on 07-24-14 no up coming appt scheduled.

## 2014-10-26 MED ORDER — ZOLPIDEM TARTRATE 10 MG PO TABS: 10.0000 mg | ORAL_TABLET | Freq: Every day | ORAL | Status: DC

## 2014-10-26 MED ORDER — CLONAZEPAM 1 MG PO TABS: 1.0000 mg | ORAL_TABLET | Freq: Three times a day (TID) | ORAL | Status: DC

## 2014-11-07 ENCOUNTER — Other Ambulatory Visit: Payer: Self-pay | Admitting: Nurse Practitioner

## 2014-11-07 DIAGNOSIS — R1310 Dysphagia, unspecified: Secondary | ICD-10-CM

## 2014-11-20 ENCOUNTER — Ambulatory Visit: Payer: Medicare HMO | Attending: Pain Medicine | Admitting: Pain Medicine

## 2014-11-20 ENCOUNTER — Encounter: Payer: Self-pay | Admitting: Pain Medicine

## 2014-11-20 VITALS — BP 145/89 | HR 70 | Temp 96.4°F | Resp 18 | Ht 66.0 in | Wt 132.0 lb

## 2014-11-20 DIAGNOSIS — M5136 Other intervertebral disc degeneration, lumbar region: Secondary | ICD-10-CM | POA: Insufficient documentation

## 2014-11-20 DIAGNOSIS — R51 Headache: Secondary | ICD-10-CM | POA: Diagnosis present

## 2014-11-20 DIAGNOSIS — M5030 Other cervical disc degeneration, unspecified cervical region: Secondary | ICD-10-CM | POA: Insufficient documentation

## 2014-11-20 DIAGNOSIS — M533 Sacrococcygeal disorders, not elsewhere classified: Secondary | ICD-10-CM | POA: Insufficient documentation

## 2014-11-20 DIAGNOSIS — M5481 Occipital neuralgia: Secondary | ICD-10-CM | POA: Insufficient documentation

## 2014-11-20 DIAGNOSIS — G43109 Migraine with aura, not intractable, without status migrainosus: Secondary | ICD-10-CM

## 2014-11-20 DIAGNOSIS — M47812 Spondylosis without myelopathy or radiculopathy, cervical region: Secondary | ICD-10-CM

## 2014-11-20 DIAGNOSIS — M542 Cervicalgia: Secondary | ICD-10-CM | POA: Diagnosis present

## 2014-11-20 DIAGNOSIS — M47816 Spondylosis without myelopathy or radiculopathy, lumbar region: Secondary | ICD-10-CM | POA: Diagnosis not present

## 2014-11-20 DIAGNOSIS — M48061 Spinal stenosis, lumbar region without neurogenic claudication: Secondary | ICD-10-CM

## 2014-11-20 DIAGNOSIS — F4310 Post-traumatic stress disorder, unspecified: Secondary | ICD-10-CM

## 2014-11-20 MED ORDER — OXYCODONE-ACETAMINOPHEN 7.5-325 MG PO TABS: ORAL_TABLET | ORAL | Status: DC

## 2014-11-20 MED ORDER — TOPIRAMATE 25 MG PO TABS: ORAL_TABLET | ORAL | Status: DC

## 2014-11-20 NOTE — Progress Notes (Signed)
   Subjective:    Patient ID: [name], female    DOB: [birth year], 69 y.o.   MRN: [id number]  HPI  Patient is 69 year old female returns to [facility] for follow-up evaluation and treatment of pain involving pain of the neck with headaches as well as pain of the upper mid and lower back and lower extremity regions. At the present time patient will undergo further evaluation of her general medical condition as discussed and we will avoid interventional treatment. Patient is noted some exacerbation of her pain occurring in the cervical region reproducing significant headaches. We will proceed with interventional treatment for pain of consisting of pain in the cervical region and headaches pending further evaluation of patient's general medical condition as discussed on today's visit. The patient agrees with suggested treatment plan.     Review of Systems     Objective:   Physical Exam  Moderate tends to palpation of the splenius capitis Cipro talus region on the right with severe tenderness to palpation on the left was noted. There was unremarkable Spurling's maneuver. There was tenderness to palpation of the acromioclavicular glenohumeral joint region a mild degree. Patient appeared to be with bilaterally equal grip strength. Tinel and Phalen's maneuver were without increase of pain of significant degree. Palpation of the thoracic facet thoracic paraspinal musculature region was a tends to palpation of moderate degree left greater than the right. No crepitus of the thoracic region was noted. Palpation over the lumbar paraspinal muscles lumbar facet region was with moderate tends to palpation with moderate to moderately severe tends to palpation of the PSIS and PII S region. There was increased pain with pressure prior to the ilium with patient in lateral decubitus position. Straight leg raising tolerated possibly 20 without increased pain with dorsiflexion noted. There was negative  clonus negative Homans. No sensory deficit of dermatomal distribution detected. Abdomen was nontender and no costovertebral tenderness was noted.      Assessment & Plan:  Degenerative disc disease lumbar spine L4-5 L3-4 degenerative changes predominantly  Degenerative disc disease of the cervical spine  Cervical facet syndrome  Bilateral occipital neuralgia  Sacroiliac joint dysfunction   Plan  Continue present medications Topamax and oxycodone  F/U PCP for evaliation of  BP and general medical  condition.  F/U surgical evaluation  F/U neurological evaluation  May consider radiofrequency rhizolysis or intraspinal procedures pending response to present treatment and F/U evaluation.  Patient to call [facility] should patient have concerns prior to scheduled return appointment.

## 2014-11-20 NOTE — Progress Notes (Signed)
Safety precautions to be maintained throughout the outpatient stay will include: orient to surroundings, keep bed in low position, maintain call bell within reach at all times, provide assistance with transfer out of bed and ambulation.  

## 2014-11-20 NOTE — Patient Instructions (Addendum)
Continue present medications Topamax and oxycodone  F/U PCP  Tejan-Sie  for evaliation of  BP and general medical  condition.  F/U surgical evaluation  F/U neurological evaluation  May consider radiofrequency rhizolysis or intraspinal procedures pending response to present treatment and F/U evaluation.  Patient to call [facility] should patient have concerns prior to scheduled return appointment.  You were given a script for Percocet A script for  Topamax was sent to your pharmacy

## 2014-11-22 ENCOUNTER — Ambulatory Visit
Admission: RE | Admit: 2014-11-22 | Discharge: 2014-11-22 | Disposition: A | Discharge status: Home / Self Care | Payer: Medicare HMO | Source: Ambulatory Visit | Attending: Nurse Practitioner | Admitting: Nurse Practitioner

## 2014-11-22 DIAGNOSIS — K589 Irritable bowel syndrome without diarrhea: Secondary | ICD-10-CM | POA: Insufficient documentation

## 2014-11-22 DIAGNOSIS — R1310 Dysphagia, unspecified: Secondary | ICD-10-CM | POA: Diagnosis present

## 2014-11-22 DIAGNOSIS — K219 Gastro-esophageal reflux disease without esophagitis: Secondary | ICD-10-CM | POA: Diagnosis not present

## 2014-11-27 ENCOUNTER — Other Ambulatory Visit: Payer: Self-pay

## 2014-11-27 ENCOUNTER — Ambulatory Visit: Payer: Medicare HMO | Admitting: Psychiatry

## 2014-12-17 ENCOUNTER — Encounter: Payer: Self-pay | Admitting: Psychiatry

## 2014-12-17 ENCOUNTER — Ambulatory Visit (INDEPENDENT_AMBULATORY_CARE_PROVIDER_SITE_OTHER): Payer: Medicare HMO | Admitting: Psychiatry

## 2014-12-17 VITALS — BP 124/68 | HR 122 | Temp 98.7°F | Ht 66.0 in | Wt 140.6 lb

## 2014-12-17 DIAGNOSIS — F4310 Post-traumatic stress disorder, unspecified: Secondary | ICD-10-CM | POA: Diagnosis not present

## 2014-12-17 DIAGNOSIS — F322 Major depressive disorder, single episode, severe without psychotic features: Secondary | ICD-10-CM

## 2014-12-17 DIAGNOSIS — F3181 Bipolar II disorder: Secondary | ICD-10-CM | POA: Diagnosis not present

## 2014-12-17 MED ORDER — FLUOXETINE HCL 20 MG PO CAPS: 60.0000 mg | ORAL_CAPSULE | Freq: Every day | ORAL | Status: DC

## 2014-12-17 MED ORDER — ZOLPIDEM TARTRATE 10 MG PO TABS: 10.0000 mg | ORAL_TABLET | Freq: Every day | ORAL | Status: DC

## 2014-12-17 MED ORDER — CLONAZEPAM 1 MG PO TABS: 1.0000 mg | ORAL_TABLET | Freq: Three times a day (TID) | ORAL | Status: DC

## 2014-12-17 NOTE — Progress Notes (Signed)
[facility] MD Progress Note  12/17/2014 6:00 PM [name]  MRN:  [id number] Subjective:  Follow-up for this 69 year old woman with a history of anxiety and mood instability maintained on medication. Patient has no new complaint Principal Problem: @PPROB @ Diagnosis:  PTSD Patient Active Problem List   Diagnosis Date Noted  . Degenerative disc disease, lumbar [M51.36] 10/18/2014  . Sacroiliac joint dysfunction [M53.3] 10/18/2014  . Facet syndrome, lumbar [M54.5] 10/18/2014  . Cervical facet joint syndrome [M53.82] 10/18/2014  . Bilateral occipital neuralgia [M54.81] 10/18/2014  . Migraine [G43.909] 10/18/2014  . Neurosis, posttraumatic [F43.10] 10/08/2014  . Spinal stenosis [M48.00]   . PTSD (post-traumatic stress disorder) [F43.10]    Total Time spent with patient: 30 minutes   Past Medical History:  Past Medical History  Diagnosis Date  . Sinus problem   . Fibromyalgia   . Chest pain   . CHF (congestive heart failure)   . IBS (irritable bowel syndrome)   . Depression   . Anxiety   . Diabetes mellitus without complication   . Spinal stenosis   . PTSD (post-traumatic stress disorder)   . Hypertension   . Graves disease     Past Surgical History  Procedure Laterality Date  . Tonsillectomy and adenoidectomy    . Toenail removal Bilateral     great toes  . Abdominal hysterectomy    . Rectal surgery     Family History:  Family History  Problem Relation Age of Onset  . Alcohol abuse Mother   . Depression Mother   . Varicose Veins Mother   . Alcohol abuse Father   . Depression Father   . Alcohol abuse Sister   . Asthma Sister   . COPD Sister   . Depression Sister   . Heart disease Sister   . Hypertension Sister   . Depression Maternal Grandmother   . Diabetes Paternal Grandmother   . Stroke Paternal Grandmother    Social History:  History  Alcohol Use No     History  Drug Use No    History   Social History  . Marital Status: Widowed    Spouse Name: N/A   . Number of Children: N/A  . Years of Education: N/A   Social History Main Topics  . Smoking status: Former Smoker -- 25 years    Types: Cigarettes    Quit date: 12/17/2011  . Smokeless tobacco: Never Used  . Alcohol Use: No  . Drug Use: No  . Sexual Activity: No   Other Topics Concern  . None   Social History Narrative   Additional History:    Sleep: Good  Appetite:  Fair   Assessment: Functioning very well. Enjoys time with her grandchildren and great-grandchildren. No new health problems. Tolerating medicine well. Feels that the increased dose of Prozac helped control her anxiety. She is not complaining of any symptoms of mania.  Musculoskeletal: Strength & Muscle Tone: within normal limits Gait & Station: normal Patient leans: N/A   Psychiatric Specialty Exam: Physical Exam  Constitutional: She appears well-developed and well-nourished.  HENT:  Head: Normocephalic and atraumatic.  Eyes: Conjunctivae are normal. Pupils are equal, round, and reactive to light.  Neck: Normal range of motion.  Cardiovascular: Normal heart sounds.   Respiratory: Effort normal.  GI: Soft.  Musculoskeletal: Normal range of motion.  Neurological: She is alert.  Skin: Skin is warm and dry.  Psychiatric: She has a normal mood and affect. Her behavior is normal. Judgment and thought content normal.  Review of Systems  Constitutional: Negative.   HENT: Negative.   Eyes: Negative.   Respiratory: Negative.   Cardiovascular: Negative.   Gastrointestinal: Negative.   Musculoskeletal: Negative.   Skin: Negative.   Neurological: Negative.   Psychiatric/Behavioral: Negative.     Blood pressure 124/68, pulse 122, temperature 98.7 F (37.1 C), temperature source Tympanic, height  (1.676 m), weight 140 lb 9.6 oz (63.776 kg), SpO2 94 %.Body mass index is 22.7 kg/(m^2).  General Appearance: Casual  Eye Contact::  Good  Speech:  Clear and Coherent  Volume:  Normal  Mood:  Euthymic   Affect:  Congruent  Thought Process:  Goal Directed  Orientation:  Full (Time, Place, and Person)  Thought Content:  Negative  Suicidal Thoughts:  No  Homicidal Thoughts:  No  Memory:  Immediate;   Good Recent;   Good Remote;   Good  Judgement:  Fair  Insight:  Fair  Psychomotor Activity:  Normal  Concentration:  Good  Recall:  Good  Fund of Knowledge:Good  Language: Good  Akathisia:  No  Handed:  Right  AIMS (if indicated):     Assets:  Communication Skills  ADL's:  Intact  Cognition: WNL  Sleep:        Current Medications: Current Outpatient Prescriptions  Medication Sig Dispense Refill  . ADVAIR DISKUS 250-50 MCG/DOSE AEPB     . atenolol (TENORMIN) 50 MG tablet Take 50 mg by mouth 2 (two) times daily.    . cetirizine (ZYRTEC) 10 MG tablet Take 10 mg by mouth daily.    . clonazePAM (KLONOPIN) 1 MG tablet Take 1 tablet (1 mg total) by mouth 3 (three) times daily. Take 1 tablet 3 times daily as needed 90 tablet 5  . CRESTOR 10 MG tablet     . FLUoxetine (PROZAC) 20 MG capsule Take 3 capsules (60 mg total) by mouth daily. 90 capsule 6  . hydrocortisone (ANUSOL-HC) 25 MG suppository Place 25 mg rectally. prn    . levothyroxine (SYNTHROID, LEVOTHROID) 125 MCG tablet     . lidocaine (LIDODERM) 5 % Remove & Discard patch within 12 hours or as directed by MD    . loperamide (IMODIUM) 2 MG capsule Take by mouth as needed for diarrhea or loose stools. prn    . lubiprostone (AMITIZA) 8 MCG capsule Take 8 mcg by mouth 2 (two) times daily with a meal.    . metFORMIN (GLUCOPHAGE) 500 MG tablet Take by mouth every other day.    . metoprolol tartrate (LOPRESSOR) 25 MG tablet Take by mouth.    . oxyCODONE-acetaminophen (PERCOCET) 7.5-325 MG per tablet Limit 3-5 tabs po/day if tolerated. 150 tablet 0  . polyethylene glycol powder (GLYCOLAX/MIRALAX) powder     . topiramate (TOPAMAX) 25 MG tablet Limit 1-2 tabs by mouth twice a day if tolerated 110 tablet 0  . zolpidem (AMBIEN) 10 MG  tablet Take 1 tablet (10 mg total) by mouth at bedtime. 30 tablet 5  . FLUoxetine (PROZAC) 40 MG capsule Take by mouth daily.      No current facility-administered medications for this visit.    Lab Results: No results found for this or any previous visit (from the past 48 hour(s)).  Physical Findings: AIMS:  , ,  ,  ,    CIWA:    COWS:     Treatment Plan Summary: Medication management and Plan Renewed all medications including the fluoxetine, clonazepam and when necessary Ambien. Reviewed side effects. Reviewed the importance of trying to  keep on a regular sleep schedule. Supportive counseling and review of her social stresses. Patient seems to be doing quite well. We will plan for a follow-up in 6 months.   Medical Decision Making:  Established Problem, Stable/Improving (1), Review of Psycho-Social Stressors (1), Review and summation of old records (2) and Review of Medication Regimen & Side Effects (2)     Elior Robinette 12/17/2014, 6:00 PM

## 2014-12-20 ENCOUNTER — Ambulatory Visit: Payer: Medicare HMO | Attending: Pain Medicine | Admitting: Pain Medicine

## 2014-12-20 VITALS — BP 119/80 | HR 69 | Temp 98.3°F | Resp 16 | Ht 66.0 in | Wt 134.0 lb

## 2014-12-20 DIAGNOSIS — R51 Headache: Secondary | ICD-10-CM | POA: Diagnosis present

## 2014-12-20 DIAGNOSIS — M533 Sacrococcygeal disorders, not elsewhere classified: Secondary | ICD-10-CM | POA: Diagnosis not present

## 2014-12-20 DIAGNOSIS — M51369 Other intervertebral disc degeneration, lumbar region without mention of lumbar back pain or lower extremity pain: Secondary | ICD-10-CM

## 2014-12-20 DIAGNOSIS — M542 Cervicalgia: Secondary | ICD-10-CM | POA: Diagnosis present

## 2014-12-20 DIAGNOSIS — M546 Pain in thoracic spine: Secondary | ICD-10-CM | POA: Diagnosis present

## 2014-12-20 DIAGNOSIS — G43109 Migraine with aura, not intractable, without status migrainosus: Secondary | ICD-10-CM

## 2014-12-20 DIAGNOSIS — M47816 Spondylosis without myelopathy or radiculopathy, lumbar region: Secondary | ICD-10-CM | POA: Diagnosis not present

## 2014-12-20 DIAGNOSIS — M4804 Spinal stenosis, thoracic region: Secondary | ICD-10-CM

## 2014-12-20 DIAGNOSIS — M5030 Other cervical disc degeneration, unspecified cervical region: Secondary | ICD-10-CM | POA: Insufficient documentation

## 2014-12-20 DIAGNOSIS — M47812 Spondylosis without myelopathy or radiculopathy, cervical region: Secondary | ICD-10-CM

## 2014-12-20 DIAGNOSIS — M5136 Other intervertebral disc degeneration, lumbar region: Secondary | ICD-10-CM

## 2014-12-20 DIAGNOSIS — M5481 Occipital neuralgia: Secondary | ICD-10-CM | POA: Diagnosis not present

## 2014-12-20 DIAGNOSIS — F4310 Post-traumatic stress disorder, unspecified: Secondary | ICD-10-CM

## 2014-12-20 MED ORDER — TOPIRAMATE 25 MG PO TABS: ORAL_TABLET | ORAL | Status: DC

## 2014-12-20 MED ORDER — OXYCODONE-ACETAMINOPHEN 7.5-325 MG PO TABS: ORAL_TABLET | ORAL | Status: DC

## 2014-12-20 NOTE — Patient Instructions (Signed)
Continue present medication Topamax and oxycodone acetaminophen  F/U PCP Dr. Ellsworth Lennox for evaliation of  BP and general medical  condition  F/U surgical evaluation  F/U neurological evaluation  May consider radiofrequency rhizolysis or intraspinal procedures pending response to present treatment and F/U evaluation   [facility] to call [facility] should patient have concerns prior to scheduled return appointmen.

## 2014-12-20 NOTE — Progress Notes (Signed)
   Subjective:    Patient ID: [name], female    DOB: 21-May-1945, 69 y.o.   MRN: [id number]  HPI  Patient is 69 year old female [facility] for further evaluation and treatment of pain involving the region of the neck and entire back upper and lower extremity region and headaches. At the present time we will avoid interventional treatment palpation undergoes further evaluation and treatment of her general medical condition. Patient is with multiple arthralgias and myalgias and may benefit from interventional treatment. We will continue Topamax and oxycodone acetaminophen at this time and await patient undergo further evaluation of her general medical condition and will proceed with interventional treatment once patient's other issues have been addressed. The patient was understanding and in agreement status treatment plan.  Review of Systems     Objective:   Physical Exam  There was mild tenderness of the knees And occipitalis musculature region. Palpation over the cervical facet cervical paraspinal muscles reproduced moderate discomfort. There appeared to be unremarkable Spurling's maneuver with questionable decreased grip strength. Tinel and Phalen's maneuver were without increase of pain of significant degree. Palpation over the thoracic facet thoracic paraspinal muscles region was with moderate tends to palpation. No crepitus of the thoracic region was noted. There was moderate to moderately severe tenderness of the lumbar paraspinal musculature region lumbar facet region. Straight leg raising limited to 20 without increased pain dorsiflexion noted with negative clonus negative Homans. DTRs difficult to elicit patient had difficulty relaxing. Mild to moderate tenderness of the greater trochanteric region iliotibial band region. There appeared to be severe tenderness to palpation over the PSIS and PII S regions and lumbar facet regions. There was negative clonus negative  Homans Abdomen was nontender with no costovertebral angle tenderness noted.      Assessment & Plan:    Degenerative disc disease lumbar spine L4-5 L3-4 degenerative changes predominantly  Degenerative disc disease of the cervical spine  Cervical facet syndrome  Bilateral occipital neuralgia  Sacroiliac joint dysfunction   Plan   Continue present medication Topamax and oxycodone  F/U PCP  [facility]  for evaliation of  BP and general medical  condition  F/U surgical evaluation  F/U neurological evaluation  May consider radiofrequency rhizolysis or intraspinal procedures pending response to present treatment and F/U evaluation   Patient to call [facility] should patient have concerns prior to scheduled return appointmen.

## 2014-12-20 NOTE — Progress Notes (Signed)
Safety precautions to be maintained throughout the outpatient stay will include: orient to surroundings, keep bed in low position, maintain call bell within reach at all times, provide assistance with transfer out of bed and ambulation.  

## 2015-01-09 ENCOUNTER — Encounter: Payer: Self-pay | Admitting: *Deleted

## 2015-01-10 ENCOUNTER — Ambulatory Visit
Admission: RE | Admit: 2015-01-10 | Discharge: 2015-01-10 | Disposition: A | Discharge status: Home / Self Care | Payer: Medicare HMO | Source: Ambulatory Visit | Attending: Gastroenterology | Admitting: Gastroenterology

## 2015-01-10 ENCOUNTER — Encounter
Admission: RE | Disposition: A | Discharge status: Home / Self Care | Payer: Self-pay | Source: Ambulatory Visit | Attending: Gastroenterology

## 2015-01-10 DIAGNOSIS — Z888 Allergy status to other drugs, medicaments and biological substances status: Secondary | ICD-10-CM | POA: Diagnosis not present

## 2015-01-10 DIAGNOSIS — K222 Esophageal obstruction: Secondary | ICD-10-CM | POA: Diagnosis not present

## 2015-01-10 DIAGNOSIS — E118 Type 2 diabetes mellitus with unspecified complications: Secondary | ICD-10-CM | POA: Diagnosis not present

## 2015-01-10 DIAGNOSIS — F4310 Post-traumatic stress disorder, unspecified: Secondary | ICD-10-CM | POA: Diagnosis not present

## 2015-01-10 DIAGNOSIS — Z7982 Long term (current) use of aspirin: Secondary | ICD-10-CM | POA: Insufficient documentation

## 2015-01-10 DIAGNOSIS — F419 Anxiety disorder, unspecified: Secondary | ICD-10-CM | POA: Diagnosis not present

## 2015-01-10 DIAGNOSIS — M797 Fibromyalgia: Secondary | ICD-10-CM | POA: Insufficient documentation

## 2015-01-10 DIAGNOSIS — K589 Irritable bowel syndrome without diarrhea: Secondary | ICD-10-CM | POA: Insufficient documentation

## 2015-01-10 DIAGNOSIS — Z79899 Other long term (current) drug therapy: Secondary | ICD-10-CM | POA: Diagnosis not present

## 2015-01-10 DIAGNOSIS — Z87891 Personal history of nicotine dependence: Secondary | ICD-10-CM | POA: Diagnosis not present

## 2015-01-10 DIAGNOSIS — I509 Heart failure, unspecified: Secondary | ICD-10-CM | POA: Insufficient documentation

## 2015-01-10 DIAGNOSIS — I10 Essential (primary) hypertension: Secondary | ICD-10-CM | POA: Insufficient documentation

## 2015-01-10 DIAGNOSIS — F329 Major depressive disorder, single episode, unspecified: Secondary | ICD-10-CM | POA: Diagnosis not present

## 2015-01-10 DIAGNOSIS — E0500 Thyrotoxicosis with diffuse goiter without thyrotoxic crisis or storm: Secondary | ICD-10-CM | POA: Diagnosis not present

## 2015-01-10 DIAGNOSIS — R1310 Dysphagia, unspecified: Secondary | ICD-10-CM | POA: Diagnosis present

## 2015-01-10 HISTORY — PX: ESOPHAGOGASTRODUODENOSCOPY (EGD) WITH PROPOFOL: SHX5813

## 2015-01-10 HISTORY — PX: SAVORY DILATION: SHX5439

## 2015-01-10 LAB — GLUCOSE, CAPILLARY: Glucose-Capillary: 94 mg/dL (ref 65–99)

## 2015-01-10 SURGERY — ESOPHAGOGASTRODUODENOSCOPY (EGD) WITH PROPOFOL
Anesthesia: General

## 2015-01-10 MED ORDER — MIDAZOLAM HCL 5 MG/5ML IJ SOLN
INTRAMUSCULAR | Status: AC
  Filled 2015-01-10: qty 5

## 2015-01-10 MED ORDER — BUTAMBEN-TETRACAINE-BENZOCAINE 2-2-14 % EX AERO
INHALATION_SPRAY | CUTANEOUS | Status: DC | PRN
  Administered 2015-01-10: 1 via TOPICAL

## 2015-01-10 MED ORDER — SODIUM CHLORIDE 0.9 % IV SOLN: INTRAVENOUS | Status: DC

## 2015-01-10 MED ORDER — FENTANYL CITRATE (PF) 100 MCG/2ML IJ SOLN
INTRAMUSCULAR | Status: AC
  Filled 2015-01-10: qty 2

## 2015-01-10 MED ORDER — FENTANYL CITRATE (PF) 100 MCG/2ML IJ SOLN
INTRAMUSCULAR | Status: DC | PRN
  Administered 2015-01-10 (×2): 50 ug via INTRAVENOUS

## 2015-01-10 MED ORDER — MIDAZOLAM HCL 5 MG/5ML IJ SOLN
INTRAMUSCULAR | Status: DC | PRN
  Administered 2015-01-10 (×3): 2 mg via INTRAVENOUS

## 2015-01-10 MED ORDER — SODIUM CHLORIDE 0.9 % IV SOLN
INTRAVENOUS | Status: DC
Start: 2015-01-10 — End: 2015-01-10
  Administered 2015-01-10: 1000 mL via INTRAVENOUS

## 2015-01-10 NOTE — Op Note (Signed)
[facility] Patient Name: [name] Procedure Date: 01/10/2015 8:32 AM MRN: [id number] Account #: [id number] Date of Birth: [birth year] Admit Type: Outpatient Age: 69 Room: [facility] ENDO ROOM 4 Gender: Female Note Status: Finalized Procedure:         Upper GI endoscopy Indications:       Dysphagia Providers:         Ezzard Standing. Bluford Kaufmann, MD Referring MD:      No Local Md, MD (Referring MD) Medicines:         Midazolam 6 mg IV, Fentanyl 100 micrograms IV, Cetacaine                     spray Complications:     No immediate complications. Procedure:         Pre-Anesthesia Assessment:                    - Prior to the procedure, a History and Physical was                     performed, and patient medications, allergies and                     sensitivities were reviewed. The patient's tolerance of                     previous anesthesia was reviewed.                    - The risks and benefits of the procedure and the sedation                     options and risks were discussed with the patient. All                     questions were answered and informed consent was obtained.                    - After reviewing the risks and benefits, the patient was                     deemed in satisfactory condition to undergo the procedure.                    After obtaining informed consent, the endoscope was passed                     under direct vision. Throughout the procedure, the                     patient's blood pressure, pulse, and oxygen saturations                     were monitored continuously. The Endoscope was introduced                     through the mouth, and advanced to the second part of                     duodenum. The upper GI endoscopy was accomplished without                     difficulty. The patient tolerated the procedure well. Findings:      A benign-appearing, intrinsic mild stenosis was found at the  cricopharyngeus. The scope was  withdrawn. Dilation was performed with a       Maloney dilator with mild resistance at 54 Fr.      The exam was otherwise without abnormality.      The entire examined stomach was normal.      The examined duodenum was normal. Impression:        - Benign-appearing esophageal stricture. Dilated.                    - The examination was otherwise normal.                    - Normal stomach.                    - Normal examined duodenum.                    - No specimens collected. Recommendation:    - Discharge patient to home.                    - Observe patient's clinical course.                    - The findings and recommendations were discussed with the                     patient. Procedure Code(s): --- Professional ---                    [phone], Esophagogastroduodenoscopy, flexible, transoral;                     diagnostic, including collection of specimen(s) by                     brushing or washing, when performed (separate procedure)                    43450, Dilation of esophagus, by unguided sound or bougie,                     single or multiple passes Diagnosis Code(s): --- Professional ---                    K22.2, Esophageal obstruction                    R13.10, Dysphagia, unspecified CPT copyright 2014 American Medical Association. All rights reserved. The codes documented in this report are preliminary and upon coder review may  be revised to meet current compliance requirements. Wallace Cullens, MD 01/10/2015 9:14:19 AM This report has been signed electronically. Number of Addenda: 0 Note Initiated On: 01/10/2015 8:32 AM      [facility]

## 2015-01-10 NOTE — H&P (Signed)
Primary Care Physician:  Luna Fuse, MD Primary Gastroenterologist:  Dr. Bluford Kaufmann  Pre-Procedure History & Physical: HPI:  [name] is a 69 y.o. female is here for an EGD with possible dysphagia  Past Medical History  Diagnosis Date  . Sinus problem   . Fibromyalgia   . Chest pain   . CHF (congestive heart failure)   . IBS (irritable bowel syndrome)   . Depression   . Anxiety   . Diabetes mellitus without complication   . Spinal stenosis   . PTSD (post-traumatic stress disorder)   . Hypertension   . Graves disease     Past Surgical History  Procedure Laterality Date  . Tonsillectomy and adenoidectomy    . Toenail removal Bilateral     great toes  . Abdominal hysterectomy    . Rectal surgery      Prior to Admission medications   Medication Sig Start Date End Date Taking? Authorizing Provider  ADVAIR DISKUS 250-50 MCG/DOSE AEPB  10/03/14  Yes Historical Provider, MD  aspirin 81 MG tablet Take 81 mg by mouth daily.   Yes Historical Provider, MD  atenolol (TENORMIN) 50 MG tablet Take 50 mg by mouth 2 (two) times daily.   Yes Historical Provider, MD  cetirizine (ZYRTEC) 10 MG tablet Take 10 mg by mouth daily.   Yes Historical Provider, MD  clonazePAM (KLONOPIN) 1 MG tablet Take 1 tablet (1 mg total) by mouth 3 (three) times daily. Take 1 tablet 3 times daily as needed 12/17/14  Yes Audery Amel, MD  CRESTOR 10 MG tablet  11/08/14  Yes Historical Provider, MD  FLUoxetine (PROZAC) 20 MG capsule Take 3 capsules (60 mg total) by mouth daily. 12/17/14  Yes Audery Amel, MD  FLUoxetine (PROZAC) 40 MG capsule Take by mouth daily.    Yes Historical Provider, MD  hydrocortisone (ANUSOL-HC) 25 MG suppository Place 25 mg rectally. prn   Yes Historical Provider, MD  levothyroxine (SYNTHROID, LEVOTHROID) 125 MCG tablet  10/03/14  Yes Historical Provider, MD  lidocaine (LIDODERM) 5 % Remove & Discard patch within 12 hours or as directed by MD   Yes Historical Provider, MD   loperamide (IMODIUM) 2 MG capsule Take by mouth as needed for diarrhea or loose stools. prn   Yes Historical Provider, MD  lubiprostone (AMITIZA) 8 MCG capsule Take 8 mcg by mouth 2 (two) times daily with a meal.   Yes Historical Provider, MD  metFORMIN (GLUCOPHAGE) 500 MG tablet Take by mouth every other day.   Yes Historical Provider, MD  metoprolol tartrate (LOPRESSOR) 25 MG tablet Take by mouth.   Yes Historical Provider, MD  oxyCODONE-acetaminophen (PERCOCET) 7.5-325 MG per tablet Limit 3-5 tabs po/day if tolerated. 12/20/14  Yes Ewing Schlein, MD  topiramate (TOPAMAX) 25 MG tablet Limit 1-2 tabs by mouth twice a day if tolerated 12/20/14  Yes Ewing Schlein, MD  zolpidem (AMBIEN) 10 MG tablet Take 1 tablet (10 mg total) by mouth at bedtime. 12/17/14  Yes Audery Amel, MD  polyethylene glycol powder (GLYCOLAX/MIRALAX) powder  10/08/14   Historical Provider, MD    Allergies as of 12/11/2014 - Review Complete 11/20/2014  Allergen Reaction Noted  . Celecoxib  08/21/2014  . Pregabalin  08/21/2014    Family History  Problem Relation Age of Onset  . Alcohol abuse Mother   . Depression Mother   . Varicose Veins Mother   . Alcohol abuse Father   . Depression Father   . Alcohol  abuse Sister   . Asthma Sister   . COPD Sister   . Depression Sister   . Heart disease Sister   . Hypertension Sister   . Depression Maternal Grandmother   . Diabetes Paternal Grandmother   . Stroke Paternal Grandmother     Social History   Social History  . Marital Status: Widowed    Spouse Name: N/A  . Number of Children: N/A  . Years of Education: N/A   Occupational History  . Not on file.   Social History Main Topics  . Smoking status: Former Smoker -- 25 years    Types: Cigarettes    Quit date: 12/17/2011  . Smokeless tobacco: Never Used  . Alcohol Use: No  . Drug Use: No  . Sexual Activity: No   Other Topics Concern  . Not on file   Social History Narrative    Review of Systems: See  HPI, otherwise negative ROS  Physical Exam: BP 116/58 mmHg  Pulse 54  Temp(Src) 98 F (36.7 C) (Tympanic)  Resp 16  Ht  (1.676 m)  Wt 64.864 kg (143 lb)  BMI 23.09 kg/m2  SpO2 96% General:   Alert,  pleasant and cooperative in NAD Head:  Normocephalic and atraumatic. Neck:  Supple; no masses or thyromegaly. Lungs:  Clear throughout to auscultation.    Heart:  Regular rate and rhythm. Abdomen:  Soft, nontender and nondistended. Normal bowel sounds, without guarding, and without rebound.   Neurologic:  Alert and  oriented x4;  grossly normal neurologically.  Impression/Plan: [name] is here for an EGD to be performed for dysphagia.  Risks, benefits, limitations, and alternatives regarding  EGD have been reviewed with the patient.  Questions have been answered.  All parties agreeable.   Montavis Schubring, Ezzard Standing, MD  01/10/2015, 8:29 AM

## 2015-01-10 NOTE — Progress Notes (Signed)
One hour post cetacaine spray administration.  NO swallowing difficulties/coughing while drinking.

## 2015-01-11 ENCOUNTER — Encounter: Payer: Self-pay | Admitting: Gastroenterology

## 2015-01-22 ENCOUNTER — Ambulatory Visit: Payer: Medicare HMO | Attending: Pain Medicine | Admitting: Pain Medicine

## 2015-01-22 ENCOUNTER — Encounter: Payer: Self-pay | Admitting: Pain Medicine

## 2015-01-22 VITALS — BP 123/61 | HR 68 | Temp 98.3°F | Resp 18 | Ht 66.0 in | Wt 136.0 lb

## 2015-01-22 DIAGNOSIS — M5136 Other intervertebral disc degeneration, lumbar region: Secondary | ICD-10-CM | POA: Insufficient documentation

## 2015-01-22 DIAGNOSIS — M5030 Other cervical disc degeneration, unspecified cervical region: Secondary | ICD-10-CM | POA: Diagnosis not present

## 2015-01-22 DIAGNOSIS — M47816 Spondylosis without myelopathy or radiculopathy, lumbar region: Secondary | ICD-10-CM | POA: Diagnosis not present

## 2015-01-22 DIAGNOSIS — M533 Sacrococcygeal disorders, not elsewhere classified: Secondary | ICD-10-CM | POA: Diagnosis not present

## 2015-01-22 DIAGNOSIS — M542 Cervicalgia: Secondary | ICD-10-CM | POA: Diagnosis present

## 2015-01-22 DIAGNOSIS — M47812 Spondylosis without myelopathy or radiculopathy, cervical region: Secondary | ICD-10-CM

## 2015-01-22 DIAGNOSIS — G43109 Migraine with aura, not intractable, without status migrainosus: Secondary | ICD-10-CM

## 2015-01-22 DIAGNOSIS — M5481 Occipital neuralgia: Secondary | ICD-10-CM | POA: Insufficient documentation

## 2015-01-22 DIAGNOSIS — R51 Headache: Secondary | ICD-10-CM | POA: Diagnosis present

## 2015-01-22 MED ORDER — OXYCODONE-ACETAMINOPHEN 7.5-325 MG PO TABS: ORAL_TABLET | ORAL | Status: DC

## 2015-01-22 MED ORDER — TOPIRAMATE 25 MG PO TABS: ORAL_TABLET | ORAL | Status: DC

## 2015-01-22 NOTE — Patient Instructions (Addendum)
PLAN   Continue present medication Topamax and oxycodone acetaminophen   Greater occipital nerve block to be performed at time of return appointment   F/U PCP Dr.  Ellsworth Lennox for evaliation of  BP and general medical  condition  F/U surgical evaluation. May consider pending follow-up evaluations  F/U neurological evaluation. May consider pending follow-up evaluations  May consider radiofrequency rhizolysis or intraspinal procedures pending response to present treatment and F/U evaluation   Patient to call [facility] should patient have concerns prior to scheduled return appointment. GENERAL RISKS AND COMPLICATIONS  What are the risk, side effects and possible complications? Generally speaking, most procedures are safe.  However, with any procedure there are risks, side effects, and the possibility of complications.  The risks and complications are dependent upon the sites that are lesioned, or the type of nerve block to be performed.  The closer the procedure is to the spine, the more serious the risks are.  Great care is taken when placing the radio frequency needles, block needles or lesioning probes, but sometimes complications can occur. 1. Infection: Any time there is an injection through the skin, there is a risk of infection.  This is why sterile conditions are used for these blocks.  There are four possible types of infection. 1. Localized skin infection. 2. Central Nervous System Infection-This can be in the form of Meningitis, which can be deadly. 3. Epidural Infections-This can be in the form of an epidural abscess, which can cause pressure inside of the spine, causing compression of the spinal cord with subsequent paralysis. This would require an emergency surgery to decompress, and there are no guarantees that the patient would recover from the paralysis. 4. Discitis-This is an infection of the intervertebral discs.  It occurs in about 1% of discography procedures.  It  is difficult to treat and it may lead to surgery.        2. Pain: the needles have to go through skin and soft tissues, will cause soreness.       3. Damage to internal structures:  The nerves to be lesioned may be near blood vessels or    other nerves which can be potentially damaged.       4. Bleeding: Bleeding is more common if the patient is taking blood thinners such as  aspirin, Coumadin, Ticiid, Plavix, etc., or if he/she have some genetic predisposition  such as hemophilia. Bleeding into the spinal canal can cause compression of the spinal  cord with subsequent paralysis.  This would require an emergency surgery to  decompress and there are no guarantees that the patient would recover from the  paralysis.       5. Pneumothorax:  Puncturing of a lung is a possibility, every time a needle is introduced in  the area of the chest or upper back.  Pneumothorax refers to free air around the  collapsed lung(s), inside of the thoracic cavity (chest cavity).  Another two possible  complications related to a similar event would include: Hemothorax and Chylothorax.   These are variations of the Pneumothorax, where instead of air around the collapsed  lung(s), you may have blood or chyle, respectively.       6. Spinal headaches: They may occur with any procedures in the area of the spine.       7. Persistent CSF (Cerebro-Spinal Fluid) leakage: This is a rare problem, but may occur  with prolonged intrathecal or epidural catheters either due to the formation of a  fistulous  track or a dural tear.       8. Nerve damage: By working so close to the spinal cord, there is always a possibility of  nerve damage, which could be as serious as a permanent spinal cord injury with  paralysis.       9. Death:  Although rare, severe deadly allergic reactions known as "Anaphylactic  reaction" can occur to any of the medications used.      10. Worsening of the symptoms:  We can always make thing worse.  What are the chances  of something like this happening? Chances of any of this occuring are extremely low.  By statistics, you have more of a chance of getting killed in a motor vehicle accident: while driving to the hospital than any of the above occurring .  Nevertheless, you should be aware that they are possibilities.  In general, it is similar to taking a shower.  Everybody knows that you can slip, hit your head and get killed.  Does that mean that you should not shower again?  Nevertheless always keep in mind that statistics do not mean anything if you happen to be on the wrong side of them.  Even if a procedure has a 1 (one) in a 1,000,000 (million) chance of going wrong, it you happen to be that one..Also, keep in mind that by statistics, you have more of a chance of having something go wrong when taking medications.  Who should not have this procedure? If you are on a blood thinning medication (e.g. Coumadin, Plavix, see list of "Blood Thinners"), or if you have an active infection going on, you should not have the procedure.  If you are taking any blood thinners, please inform your physician.  How should I prepare for this procedure?  Do not eat or drink anything at least six hours prior to the procedure.  Bring a driver with you .  It cannot be a taxi.  Come accompanied by an adult that can drive you back, and that is strong enough to help you if your legs get weak or numb from the local anesthetic.  Take all of your medicines the morning of the procedure with just enough water to swallow them.  If you have diabetes, make sure that you are scheduled to have your procedure done first thing in the morning, whenever possible.  If you have diabetes, take only half of your insulin dose and notify our nurse that you have done so as soon as you arrive at the clinic.  If you are diabetic, but only take blood sugar pills (oral hypoglycemic), then do not take them on the morning of your procedure.  You may take them  after you have had the procedure.  Do not take aspirin or any aspirin-containing medications, at least eleven (11) days prior to the procedure.  They may prolong bleeding.  Wear loose fitting clothing that may be easy to take off and that you would not mind if it got stained with Betadine or blood.  Do not wear any jewelry or perfume  Remove any nail coloring.  It will interfere with some of our monitoring equipment.  NOTE: Remember that this is not meant to be interpreted as a complete list of all possible complications.  Unforeseen problems may occur.  BLOOD THINNERS The following drugs contain aspirin or other products, which can cause increased bleeding during surgery and should not be taken for 2 weeks prior to and 1 week  after surgery.  If you should need take something for relief of minor pain, you may take acetaminophen which is found in Tylenol,m Datril, Anacin-3 and Panadol. It is not blood thinner. The products listed below are.  Do not take any of the products listed below in addition to any listed on your instruction sheet.  A.P.C or A.P.C with Codeine Codeine Phosphate Capsules #3 Ibuprofen Ridaura  ABC compound Congesprin Imuran rimadil  Advil Cope Indocin Robaxisal  Alka-Seltzer Effervescent Pain Reliever and Antacid Coricidin or Coricidin-D  Indomethacin Rufen  Alka-Seltzer plus Cold Medicine Cosprin Ketoprofen S-A-C Tablets  Anacin Analgesic Tablets or Capsules Coumadin Korlgesic Salflex  Anacin Extra Strength Analgesic tablets or capsules CP-2 Tablets Lanoril Salicylate  Anaprox Cuprimine Capsules Levenox Salocol  Anexsia-D Dalteparin Magan Salsalate  Anodynos Darvon compound Magnesium Salicylate Sine-off  Ansaid Dasin Capsules Magsal Sodium Salicylate  Anturane Depen Capsules Marnal Soma  APF Arthritis pain formula Dewitt's Pills Measurin Stanback  Argesic Dia-Gesic Meclofenamic Sulfinpyrazone  Arthritis Bayer Timed Release Aspirin Diclofenac Meclomen Sulindac   Arthritis pain formula Anacin Dicumarol Medipren Supac  Analgesic (Safety coated) Arthralgen Diffunasal Mefanamic Suprofen  Arthritis Strength Bufferin Dihydrocodeine Mepro Compound Suprol  Arthropan liquid Dopirydamole Methcarbomol with Aspirin Synalgos  ASA tablets/Enseals Disalcid Micrainin Tagament  Ascriptin Doan's Midol Talwin  Ascriptin A/D Dolene Mobidin Tanderil  Ascriptin Extra Strength Dolobid Moblgesic Ticlid  Ascriptin with Codeine Doloprin or Doloprin with Codeine Momentum Tolectin  Asperbuf Duoprin Mono-gesic Trendar  Aspergum Duradyne Motrin or Motrin IB Triminicin  Aspirin plain, buffered or enteric coated Durasal Myochrisine Trigesic  Aspirin Suppositories Easprin Nalfon Trillsate  Aspirin with Codeine Ecotrin Regular or Extra Strength Naprosyn Uracel  Atromid-S Efficin Naproxen Ursinus  Auranofin Capsules Elmiron Neocylate Vanquish  Axotal Emagrin Norgesic Verin  Azathioprine Empirin or Empirin with Codeine Normiflo Vitamin E  Azolid Emprazil Nuprin Voltaren  Bayer Aspirin plain, buffered or children's or timed BC Tablets or powders Encaprin Orgaran Warfarin Sodium  Buff-a-Comp Enoxaparin Orudis Zorpin  Buff-a-Comp with Codeine Equegesic Os-Cal-Gesic   Buffaprin Excedrin plain, buffered or Extra Strength Oxalid   Bufferin Arthritis Strength Feldene Oxphenbutazone   Bufferin plain or Extra Strength Feldene Capsules Oxycodone with Aspirin   Bufferin with Codeine Fenoprofen Fenoprofen Pabalate or Pabalate-SF   Buffets II Flogesic Panagesic   Buffinol plain or Extra Strength Florinal or Florinal with Codeine Panwarfarin   Buf-Tabs Flurbiprofen Penicillamine   Butalbital Compound Four-way cold tablets Penicillin   Butazolidin Fragmin Pepto-Bismol   Carbenicillin Geminisyn Percodan   Carna Arthritis Reliever Geopen Persantine   Carprofen Gold's salt Persistin   Chloramphenicol Goody's Phenylbutazone   Chloromycetin Haltrain Piroxlcam   Clmetidine heparin Plaquenil    Cllnoril Hyco-pap Ponstel   Clofibrate Hydroxy chloroquine Propoxyphen         Before stopping any of these medications, be sure to consult the physician who ordered them.  Some, such as Coumadin (Warfarin) are ordered to prevent or treat serious conditions such as "deep thrombosis", "pumonary embolisms", and other heart problems.  The amount of time that you may need off of the medication may also vary with the medication and the reason for which you were taking it.  If you are taking any of these medications, please make sure you notify your pain physician before you undergo any procedures.         Occipital Nerve Block Patient Information  Description: The occipital nerves originate in the cervical (neck) spinal cord and travel upward through muscle and tissue to supply sensation to the back of  the head and top of the scalp.  In addition, the nerves control some of the muscles of the scalp.  Occipital neuralgia is an irritation of these nerves which can cause headaches, numbness of the scalp, and neck discomfort.     The occipital nerve block will interrupt nerve transmission through these nerves and can relieve pain and spasm.  The block consists of insertion of a small needle under the skin in the back of the head to deposit local anesthetic (numbing medicine) and/or steroids around the nerve.  The entire block usually lasts less than 5 minutes.  Conditions which may be treated by occipital blocks:   Muscular pain and spasm of the scalp  Nerve irritation, back of the head  Headaches  Upper neck pain  Preparation for the injection:  12. Do not eat any solid food or dairy products within 6 hours of your appointment. 13. You may drink clear liquids up to 2 hours before appointment.  Clear liquids include water, black coffee, juice or soda.  No milk or cream please. 14. You may take your regular medication, including pain medications, with a sip of water before you appointment.   Diabetics should hold regular insulin (if taken separately) and take 1/2 normal NPH dose the morning of the procedure.  Carry some sugar containing items with you to your appointment. 15. A driver must accompany you and be prepared to drive you home after your procedure. 16. Bring all your current medications with you. 17. An IV may be inserted and sedation may be given at the discretion of the physician. 18. A blood pressure cuff, EKG, and other monitors will often be applied during the procedure.  Some patients may need to have extra oxygen administered for a short period. 19. You will be asked to provide medical information, including your allergies and medications, prior to the procedure.  We must know immediately if you are taking blood thinners (like Coumadin/Warfarin) or if you are allergic to IV iodine contrast (dye).  We must know if you could possible be pregnant.  20. Do not wear a high collared shirt or turtleneck.  Tie long hair up in the back if possible.  Possible side-effects:   Bleeding from needle site  Infection (rare, may require surgery)  Nerve injury (rare)  Hair on back of neck can be tinged with iodine scrub (this will wash out)  Light-headedness (temporary)  Pain at injection site (several days)  Decreased blood pressure (rare, temporary)  Seizure (very rare)  Call if you experience:   Hives or difficulty breathing ( go to the emergency room)  Inflammation or drainage at the injection site(s)  Please note:  Although the local anesthetic injected can often make your painful muscles or headache feel good for several hours after the injection, the pain may return.  It takes 3-7 days for steroids to work.  You may not notice any pain relief for at least one week.  If effective, we will often do a series of injections spaced 3-6 weeks apart to maximally decrease your pain.  If you have any questions, please call [phone] [facility]

## 2015-01-22 NOTE — Progress Notes (Signed)
Safety precautions to be maintained throughout the outpatient stay will include: orient to surroundings, keep bed in low position, maintain call bell within reach at all times, provide assistance with transfer out of bed and ambulation.  

## 2015-01-22 NOTE — Progress Notes (Signed)
   Subjective:    Patient ID: [name], female    DOB: 06/21/1945, 69 y.o.   MRN: [id number]  HPI Patient is 69 year old female [facility] for further evaluation and treatment of pain involving the neck with headaches as well as pain involving the upper mid and lower back and lower extremity regions. Patient states that she is no significant spasms occurring in the upper back region neck and has had increase reconsider headaches. Patient wishes to proceed interventional treatment for treatment of pain involving the neck upper back and headaches. We discussed patient's condition and proceed with greater occipital nerve block at time return appointment in attempt to decrease severity of patient's symptoms, minimize progression of patient's symptoms, and avoid need for more involved treatment. Patient will continue Topamax and oxycodone acetaminophen as prescribed at this time. The patient is in agreement with suggested treatment plan. Patient denies trauma change in events of daily living the cause any change in symptomatology.     Review of Systems     Objective:   Physical Exam there was moderate to moderately severe tenderness to palpation of the splenius capitis and occipitalis musculature region with severe muscle spasms noted in the splenius capitis occipitalis musculature region. There was severe muscle spasms of the trapezius levator scapula and rhomboid musculature regions as well. There appeared to be unremarkable Spurling's maneuver. No masses of the head and neck were noted. Patient appeared to be with slightly decreased grip strength. Tinel and Phalen's maneuver without increased pain of significant degree. There was tenderness over the thoracic facet thoracic paraspinal muscles region as well as the lumbar paraspinal muscle lumbar facet region. Lateral bending and rotation associated increased pain tinged palpation over the lumbar facets lumbar paraspinal muscles  region of moderate degree. There was negative clonus negative Homans there was mild tenderness along the greater trochanteric region iliotibial band region. Reevaluation reproduced moderate discomfort of the greater trochanteric region and iliotibial band region. No definite sensory deficit of dermatomal distribution of the lower extremities were noted. There was moderate tenderness of the PSIS and PII S region noted. Abdomen nontender and no costovertebral tenderness noted.      Assessment & Plan:    Degenerative disc disease lumbar spine L4-5 L3-4 degenerative changes predominantly  Degenerative disc disease of the cervical spine  Cervical facet syndrome  Bilateral occipital neuralgia  Sacroiliac joint dysfunction    PLAN   Continue present medication Topamax and oxycodone acetaminophen   Greater occipital nerve block to be performed at time return appointment  F/U PCP Dr.[email or web address]-[email or web address]  for evaliation of  BP and general medical  condition  F/U surgical evaluation. May consider pending follow-up evaluations  F/U neurological evaluation. May consider pending follow-up evaluations  May consider radiofrequency rhizolysis or intraspinal procedures pending response to present treatment and F/U evaluation   Patient to call [facility] should patient have concerns prior to scheduled return appointment.

## 2015-01-30 ENCOUNTER — Ambulatory Visit: Payer: Medicare HMO | Admitting: Pain Medicine

## 2015-02-19 ENCOUNTER — Ambulatory Visit: Payer: Medicare Other | Attending: Pain Medicine | Admitting: Pain Medicine

## 2015-02-19 ENCOUNTER — Encounter: Payer: Self-pay | Admitting: Pain Medicine

## 2015-02-19 VITALS — BP 104/71 | HR 54 | Temp 97.6°F | Resp 16 | Ht 66.0 in | Wt 132.0 lb

## 2015-02-19 DIAGNOSIS — M5136 Other intervertebral disc degeneration, lumbar region: Secondary | ICD-10-CM

## 2015-02-19 DIAGNOSIS — M533 Sacrococcygeal disorders, not elsewhere classified: Secondary | ICD-10-CM | POA: Diagnosis not present

## 2015-02-19 DIAGNOSIS — G43109 Migraine with aura, not intractable, without status migrainosus: Secondary | ICD-10-CM

## 2015-02-19 DIAGNOSIS — M47812 Spondylosis without myelopathy or radiculopathy, cervical region: Secondary | ICD-10-CM

## 2015-02-19 DIAGNOSIS — M48061 Spinal stenosis, lumbar region without neurogenic claudication: Secondary | ICD-10-CM

## 2015-02-19 DIAGNOSIS — M5030 Other cervical disc degeneration, unspecified cervical region: Secondary | ICD-10-CM | POA: Insufficient documentation

## 2015-02-19 DIAGNOSIS — M47816 Spondylosis without myelopathy or radiculopathy, lumbar region: Secondary | ICD-10-CM | POA: Diagnosis not present

## 2015-02-19 DIAGNOSIS — M4804 Spinal stenosis, thoracic region: Secondary | ICD-10-CM

## 2015-02-19 DIAGNOSIS — M5481 Occipital neuralgia: Secondary | ICD-10-CM | POA: Diagnosis not present

## 2015-02-19 DIAGNOSIS — F4310 Post-traumatic stress disorder, unspecified: Secondary | ICD-10-CM

## 2015-02-19 DIAGNOSIS — M542 Cervicalgia: Secondary | ICD-10-CM | POA: Diagnosis present

## 2015-02-19 DIAGNOSIS — R51 Headache: Secondary | ICD-10-CM | POA: Diagnosis present

## 2015-02-19 MED ORDER — TOPIRAMATE 25 MG PO TABS: ORAL_TABLET | ORAL | Status: DC

## 2015-02-19 MED ORDER — OXYCODONE-ACETAMINOPHEN 7.5-325 MG PO TABS: ORAL_TABLET | ORAL | Status: DC

## 2015-02-19 NOTE — Progress Notes (Signed)
   Subjective:    Patient ID: [name], female    DOB: [birth year], 69 y.o.   MRN: [id number]  HPI Patient 69 year old female returns to [facility] for further evaluation and treatment of pain involving the region of the neck headaches as well as the mid and lower back lower extremity regions patient is with severe pain occurring across the back radiating to the buttocks on the left and right lower extremity regions patient denies any trauma change in events of daily living the call significant change in symptomatology. Patient states that the pain radiates to the buttocks becomes more intense with standing and walking. Patient also has difficulty climbing stairs. Patient didn't states that the pain becomes more intense as the day progresses. We will proceed with interventional treatment at time return appointment consisting of block of nerves to the sacroiliac joint and consider for additional treatment pending response to treatment and follow-up evaluation. The patient was understanding and in agreement with suggested treatment plan   Review of Systems     Objective:   Physical Exam  There was tends to palpation of the splenius capitis and occipitalis musculature regions. Palpation of these regions reproduced pain of mild to moderate degree. There was mild to moderate tenderness of the cervical facet cervical paraspinal musculature region and the thoracic facet thoracic paraspinal musculature region. There was unremarkable Spurling's maneuver. Tinel and Phalen's maneuver were without increased pain of significant degree. There was minimal tenderness of the acromioclavicular and glenohumeral joint regions. Palpation over the lumbar paraspinal muscles region lumbar facet region associated with moderate discomfort. There was moderate to moderately severe tends to palpation of the PSIS and PII S region as well as the gluteal and piriformis musculature regions. There was moderate increased  pain to moderately severe increased pain with pressure applied to the ilium with patient in lateral decubitus position. There was mild tinnitus of the greater trochanteric region and iliotibial band region. Straight leg raising tolerates 30 without increased pain with dorsiflexion noted DTRs appear to be trace at the knees. There was negative clonus negative Homans. No sensory deficit of dermatomal distribution detected. There was no abdominal tends to palpation and no costovertebral angle tenderness noted.      Assessment & Plan:  Sacroiliac joint dysfunction  Degenerative disc disease lumbar spine L4-5 L3-4 degenerative changes predominantly  Degenerative disc disease of the cervical spine  Cervical facet syndrome  Bilateral occipital neuralgia     PLAN   Continue present medication Topamax oxycodone acetaminophen  Block of nerves to sacroiliac joint to be performed at time return appointment  F/U PCP for evaliation of  BP and general medical  condition  F/U surgical evaluation. May consider pending follow-up evaluations  F/U neurological evaluation. May consider pending follow-up evaluations  May consider radiofrequency rhizolysis or intraspinal procedures pending response to present treatment and F/U evaluation   Patient to call [facility] should patient have concerns prior to scheduled return appointment.

## 2015-02-19 NOTE — Progress Notes (Signed)
Safety precautions to be maintained throughout the outpatient stay will include: orient to surroundings, keep bed in low position, maintain call bell within reach at all times, provide assistance with transfer out of bed and ambulation.  

## 2015-02-19 NOTE — Patient Instructions (Addendum)
PLAN   Continue present medication Topamax and oxycodone acetaminophen  Block of nerves to the sacroiliac joint to be performed at time of return appointment  F/U PCP  Tejan-Sie for evaliation of  BP and general medical  condition  F/U surgical evaluation. May consider pending follow-up evaluations  F/U neurological evaluation. May consider pending follow-up evaluations  May consider radiofrequency rhizolysis or intraspinal procedures pending response to present treatment and F/U evaluation   Patient to call [facility] should patient have concerns prior to scheduled return appointment. Sacroiliac (SI) Joint Injection Patient Information  Description: The sacroiliac joint connects the scrum (very low back and tailbone) to the ilium (a pelvic bone which also forms half of the hip joint).  Normally this joint experiences very little motion.  When this joint becomes inflamed or unstable low back and or hip and pelvis pain may result.  Injection of this joint with local anesthetics (numbing medicines) and steroids can provide diagnostic information and reduce pain.  This injection is performed with the aid of x-ray guidance into the tailbone area while you are lying on your stomach.   You may experience an electrical sensation down the leg while this is being done.  You may also experience numbness.  We also may ask if we are reproducing your normal pain during the injection.  Conditions which may be treated SI injection:   Low back, buttock, hip or leg pain  Preparation for the Injection:  1. Do not eat any solid food or dairy products within 6 hours of your appointment.  2. You may drink clear liquids up to 2 hours before appointment.  Clear liquids include water, black coffee, juice or soda.  No milk or cream please. 3. You may take your regular medications, including pain medications with a sip of water before your appointment.  Diabetics should hold regular insulin (if take  separately) and take 1/2 normal NPH dose the morning of the procedure.  Carry some sugar containing items with you to your appointment. 4. A driver must accompany you and be prepared to drive you home after your procedure. 5. Bring all of your current medications with you. 6. An IV may be inserted and sedation may be given at the discretion of the physician. 7. A blood pressure cuff, EKG and other monitors will often be applied during the procedure.  Some patients may need to have extra oxygen administered for a short period.  8. You will be asked to provide medical information, including your allergies, prior to the procedure.  We must know immediately if you are taking blood thinners (like Coumadin/Warfarin) or if you are allergic to IV iodine contrast (dye).  We must know if you could possible be pregnant.  Possible side effects:   Bleeding from needle site  Infection (rare, may require surgery)  Nerve injury (rare)  Numbness & tingling (temporary)  A brief convulsion or seizure  Light-headedness (temporary)  Pain at injection site (several days)  Decreased blood pressure (temporary)  Weakness in the leg (temporary)   Call if you experience:   New onset weakness or numbness of an extremity below the injection site that last more than 8 hours.  Hives or difficulty breathing ( go to the emergency room)  Inflammation or drainage at the injection site  Any new symptoms which are concerning to you  Please note:  Although the local anesthetic injected can often make your back/ hip/ buttock/ leg feel good for several hours after the injections,  the pain will likely return.  It takes 3-7 days for steroids to work in the sacroiliac area.  You may not notice any pain relief for at least that one week.  If effective, we will often do a series of three injections spaced 3-6 weeks apart to maximally decrease your pain.  After the initial series, we generally will wait some months  before a repeat injection of the same type.  If you have any questions, please call [phone] [facility]  GENERAL RISKS AND COMPLICATIONS  What are the risk, side effects and possible complications? Generally speaking, most procedures are safe.  However, with any procedure there are risks, side effects, and the possibility of complications.  The risks and complications are dependent upon the sites that are lesioned, or the type of nerve block to be performed.  The closer the procedure is to the spine, the more serious the risks are.  Great care is taken when placing the radio frequency needles, block needles or lesioning probes, but sometimes complications can occur. 1. Infection: Any time there is an injection through the skin, there is a risk of infection.  This is why sterile conditions are used for these blocks.  There are four possible types of infection. 1. Localized skin infection. 2. Central Nervous System Infection-This can be in the form of Meningitis, which can be deadly. 3. Epidural Infections-This can be in the form of an epidural abscess, which can cause pressure inside of the spine, causing compression of the spinal cord with subsequent paralysis. This would require an emergency surgery to decompress, and there are no guarantees that the patient would recover from the paralysis. 4. Discitis-This is an infection of the intervertebral discs.  It occurs in about 1% of discography procedures.  It is difficult to treat and it may lead to surgery.        2. Pain: the needles have to go through skin and soft tissues, will cause soreness.       3. Damage to internal structures:  The nerves to be lesioned may be near blood vessels or    other nerves which can be potentially damaged.       4. Bleeding: Bleeding is more common if the patient is taking blood thinners such as  aspirin, Coumadin, Ticiid, Plavix, etc., or if he/she have some genetic  predisposition  such as hemophilia. Bleeding into the spinal canal can cause compression of the spinal  cord with subsequent paralysis.  This would require an emergency surgery to  decompress and there are no guarantees that the patient would recover from the  paralysis.       5. Pneumothorax:  Puncturing of a lung is a possibility, every time a needle is introduced in  the area of the chest or upper back.  Pneumothorax refers to free air around the  collapsed lung(s), inside of the thoracic cavity (chest cavity).  Another two possible  complications related to a similar event would include: Hemothorax and Chylothorax.   These are variations of the Pneumothorax, where instead of air around the collapsed  lung(s), you may have blood or chyle, respectively.       6. Spinal headaches: They may occur with any procedures in the area of the spine.       7. Persistent CSF (Cerebro-Spinal Fluid) leakage: This is a rare problem, but may occur  with prolonged intrathecal or epidural catheters either due to the formation of a fistulous  track or a dural tear.       8. Nerve damage: By working so close to the spinal cord, there is always a possibility of  nerve damage, which could be as serious as a permanent spinal cord injury with  paralysis.       9. Death:  Although rare, severe deadly allergic reactions known as "Anaphylactic  reaction" can occur to any of the medications used.      10. Worsening of the symptoms:  We can always make thing worse.  What are the chances of something like this happening? Chances of any of this occuring are extremely low.  By statistics, you have more of a chance of getting killed in a motor vehicle accident: while driving to the hospital than any of the above occurring .  Nevertheless, you should be aware that they are possibilities.  In general, it is similar to taking a shower.  Everybody knows that you can slip, hit your head and get killed.  Does that mean that you should not  shower again?  Nevertheless always keep in mind that statistics do not mean anything if you happen to be on the wrong side of them.  Even if a procedure has a 1 (one) in a 1,000,000 (million) chance of going wrong, it you happen to be that one..Also, keep in mind that by statistics, you have more of a chance of having something go wrong when taking medications.  Who should not have this procedure? If you are on a blood thinning medication (e.g. Coumadin, Plavix, see list of "Blood Thinners"), or if you have an active infection going on, you should not have the procedure.  If you are taking any blood thinners, please inform your physician.  How should I prepare for this procedure?  Do not eat or drink anything at least six hours prior to the procedure.  Bring a driver with you .  It cannot be a taxi.  Come accompanied by an adult that can drive you back, and that is strong enough to help you if your legs get weak or numb from the local anesthetic.  Take all of your medicines the morning of the procedure with just enough water to swallow them.  If you have diabetes, make sure that you are scheduled to have your procedure done first thing in the morning, whenever possible.  If you have diabetes, take only half of your insulin dose and notify our nurse that you have done so as soon as you arrive at the clinic.  If you are diabetic, but only take blood sugar pills (oral hypoglycemic), then do not take them on the morning of your procedure.  You may take them after you have had the procedure.  Do not take aspirin or any aspirin-containing medications, at least eleven (11) days prior to the procedure.  They may prolong bleeding.  Wear loose fitting clothing that may be easy to take off and that you would not mind if it got stained with Betadine or blood.  Do not wear any jewelry or perfume  Remove any nail coloring.  It will interfere with some of our monitoring equipment.  NOTE: Remember that  this is not meant to be interpreted as a complete list of all possible complications.  Unforeseen problems may occur.  BLOOD THINNERS The following drugs contain aspirin or other products, which can cause increased bleeding during surgery and should not be taken for 2 weeks prior to and 1 week after surgery.  If you should need take something for relief of minor pain, you may take acetaminophen which is found in Tylenol,m Datril, Anacin-3 and Panadol. It is not blood thinner. The products listed below are.  Do not take any of the products listed below in addition to any listed on your instruction sheet.  A.P.C or A.P.C with Codeine Codeine Phosphate Capsules #3 Ibuprofen Ridaura  ABC compound Congesprin Imuran rimadil  Advil Cope Indocin Robaxisal  Alka-Seltzer Effervescent Pain Reliever and Antacid Coricidin or Coricidin-D  Indomethacin Rufen  Alka-Seltzer plus Cold Medicine Cosprin Ketoprofen S-A-C Tablets  Anacin Analgesic Tablets or Capsules Coumadin Korlgesic Salflex  Anacin Extra Strength Analgesic tablets or capsules CP-2 Tablets Lanoril Salicylate  Anaprox Cuprimine Capsules Levenox Salocol  Anexsia-D Dalteparin Magan Salsalate  Anodynos Darvon compound Magnesium Salicylate Sine-off  Ansaid Dasin Capsules Magsal Sodium Salicylate  Anturane Depen Capsules Marnal Soma  APF Arthritis pain formula Dewitt's Pills Measurin Stanback  Argesic Dia-Gesic Meclofenamic Sulfinpyrazone  Arthritis Bayer Timed Release Aspirin Diclofenac Meclomen Sulindac  Arthritis pain formula Anacin Dicumarol Medipren Supac  Analgesic (Safety coated) Arthralgen Diffunasal Mefanamic Suprofen  Arthritis Strength Bufferin Dihydrocodeine Mepro Compound Suprol  Arthropan liquid Dopirydamole Methcarbomol with Aspirin Synalgos  ASA tablets/Enseals Disalcid Micrainin Tagament  Ascriptin Doan's Midol Talwin  Ascriptin A/D Dolene Mobidin Tanderil  Ascriptin Extra Strength Dolobid Moblgesic Ticlid  Ascriptin with Codeine  Doloprin or Doloprin with Codeine Momentum Tolectin  Asperbuf Duoprin Mono-gesic Trendar  Aspergum Duradyne Motrin or Motrin IB Triminicin  Aspirin plain, buffered or enteric coated Durasal Myochrisine Trigesic  Aspirin Suppositories Easprin Nalfon Trillsate  Aspirin with Codeine Ecotrin Regular or Extra Strength Naprosyn Uracel  Atromid-S Efficin Naproxen Ursinus  Auranofin Capsules Elmiron Neocylate Vanquish  Axotal Emagrin Norgesic Verin  Azathioprine Empirin or Empirin with Codeine Normiflo Vitamin E  Azolid Emprazil Nuprin Voltaren  Bayer Aspirin plain, buffered or children's or timed BC Tablets or powders Encaprin Orgaran Warfarin Sodium  Buff-a-Comp Enoxaparin Orudis Zorpin  Buff-a-Comp with Codeine Equegesic Os-Cal-Gesic   Buffaprin Excedrin plain, buffered or Extra Strength Oxalid   Bufferin Arthritis Strength Feldene Oxphenbutazone   Bufferin plain or Extra Strength Feldene Capsules Oxycodone with Aspirin   Bufferin with Codeine Fenoprofen Fenoprofen Pabalate or Pabalate-SF   Buffets II Flogesic Panagesic   Buffinol plain or Extra Strength Florinal or Florinal with Codeine Panwarfarin   Buf-Tabs Flurbiprofen Penicillamine   Butalbital Compound Four-way cold tablets Penicillin   Butazolidin Fragmin Pepto-Bismol   Carbenicillin Geminisyn Percodan   Carna Arthritis Reliever Geopen Persantine   Carprofen Gold's salt Persistin   Chloramphenicol Goody's Phenylbutazone   Chloromycetin Haltrain Piroxlcam   Clmetidine heparin Plaquenil   Cllnoril Hyco-pap Ponstel   Clofibrate Hydroxy chloroquine Propoxyphen         Before stopping any of these medications, be sure to consult the physician who ordered them.  Some, such as Coumadin (Warfarin) are ordered to prevent or treat serious conditions such as "deep thrombosis", "pumonary embolisms", and other heart problems.  The amount of time that you may need off of the medication may also vary with the medication and the reason for which  you were taking it.  If you are taking any of these medications, please make sure you notify your pain physician before you undergo any procedures.

## 2015-03-06 ENCOUNTER — Encounter: Payer: Self-pay | Admitting: Pain Medicine

## 2015-03-06 ENCOUNTER — Ambulatory Visit: Payer: Medicare Other | Attending: Pain Medicine | Admitting: Pain Medicine

## 2015-03-06 VITALS — BP 123/73 | HR 70 | Temp 98.0°F | Resp 14

## 2015-03-06 DIAGNOSIS — G43109 Migraine with aura, not intractable, without status migrainosus: Secondary | ICD-10-CM

## 2015-03-06 DIAGNOSIS — M79604 Pain in right leg: Secondary | ICD-10-CM | POA: Diagnosis present

## 2015-03-06 DIAGNOSIS — M47816 Spondylosis without myelopathy or radiculopathy, lumbar region: Secondary | ICD-10-CM | POA: Diagnosis not present

## 2015-03-06 DIAGNOSIS — M5136 Other intervertebral disc degeneration, lumbar region: Secondary | ICD-10-CM | POA: Insufficient documentation

## 2015-03-06 DIAGNOSIS — M48061 Spinal stenosis, lumbar region without neurogenic claudication: Secondary | ICD-10-CM

## 2015-03-06 DIAGNOSIS — M4804 Spinal stenosis, thoracic region: Secondary | ICD-10-CM

## 2015-03-06 DIAGNOSIS — M533 Sacrococcygeal disorders, not elsewhere classified: Secondary | ICD-10-CM

## 2015-03-06 DIAGNOSIS — M47812 Spondylosis without myelopathy or radiculopathy, cervical region: Secondary | ICD-10-CM

## 2015-03-06 DIAGNOSIS — M79605 Pain in left leg: Secondary | ICD-10-CM | POA: Diagnosis present

## 2015-03-06 DIAGNOSIS — M5481 Occipital neuralgia: Secondary | ICD-10-CM

## 2015-03-06 DIAGNOSIS — M545 Low back pain: Secondary | ICD-10-CM | POA: Diagnosis present

## 2015-03-06 MED ORDER — ORPHENADRINE CITRATE 30 MG/ML IJ SOLN
INTRAMUSCULAR | Status: AC
  Filled 2015-03-06: qty 2

## 2015-03-06 MED ORDER — BUPIVACAINE HCL (PF) 0.25 % IJ SOLN
30.0000 mL | Freq: Once | INTRAMUSCULAR | Status: AC
  Administered 2015-03-06: 14:00:00

## 2015-03-06 MED ORDER — TRIAMCINOLONE ACETONIDE 40 MG/ML IJ SUSP
40.0000 mg | Freq: Once | INTRAMUSCULAR | Status: AC
  Administered 2015-03-06: 14:00:00

## 2015-03-06 MED ORDER — BUPIVACAINE HCL (PF) 0.25 % IJ SOLN
INTRAMUSCULAR | Status: AC
  Filled 2015-03-06: qty 30

## 2015-03-06 MED ORDER — MIDAZOLAM HCL 5 MG/5ML IJ SOLN
5.0000 mg | Freq: Once | INTRAMUSCULAR | Status: AC
  Administered 2015-03-06: 5 mg via INTRAVENOUS

## 2015-03-06 MED ORDER — LIDOCAINE HCL (PF) 1 % IJ SOLN: 10.0000 mL | Freq: Once | INTRAMUSCULAR | Status: DC

## 2015-03-06 MED ORDER — FENTANYL CITRATE (PF) 100 MCG/2ML IJ SOLN
INTRAMUSCULAR | Status: AC
  Administered 2015-03-06: 100 ug via INTRAVENOUS
  Filled 2015-03-06: qty 2

## 2015-03-06 MED ORDER — LACTATED RINGERS IV SOLN: 1000.0000 mL | INTRAVENOUS | Status: DC

## 2015-03-06 MED ORDER — TRIAMCINOLONE ACETONIDE 40 MG/ML IJ SUSP
INTRAMUSCULAR | Status: AC
  Filled 2015-03-06: qty 1

## 2015-03-06 MED ORDER — MIDAZOLAM HCL 5 MG/5ML IJ SOLN
INTRAMUSCULAR | Status: AC
  Administered 2015-03-06: 5 mg via INTRAVENOUS
  Filled 2015-03-06: qty 5

## 2015-03-06 MED ORDER — FENTANYL CITRATE (PF) 100 MCG/2ML IJ SOLN
100.0000 ug | Freq: Once | INTRAMUSCULAR | Status: AC
  Administered 2015-03-06: 100 ug via INTRAVENOUS

## 2015-03-06 MED ORDER — ORPHENADRINE CITRATE 30 MG/ML IJ SOLN
60.0000 mg | Freq: Once | INTRAMUSCULAR | Status: AC
  Administered 2015-03-06: 14:00:00 via INTRAMUSCULAR

## 2015-03-06 NOTE — Patient Instructions (Addendum)
PLAN   Continue present medication Topamax and oxycodone acetaminophen  F/U PCP Dr.Tejan-Sie  for evaliation of  BP and general medical  condition  F/U surgical evaluation. May consider pending follow-up evaluations  F/U neurological evaluation. May consider pending follow-up evaluations  May consider radiofrequency rhizolysis or intraspinal procedures pending response to present treatment and F/U evaluation   Patient to call Pain Management Center should patient have concerns prior to scheduled return appointment.  Pain Management Discharge Instructions  General Discharge Instructions :  If you need to reach your doctor call: Monday-Friday 8:00 am - 4:00 pm at [phone] or toll free [phone].  After clinic hours [phone] to have operator reach doctor.  Bring all of your medication bottles to all your appointments in the pain clinic.  To cancel or reschedule your appointment with Pain Management please remember to call 24 hours in advance to avoid a fee.  Refer to the educational materials which you have been given on: General Risks, I had my Procedure. Discharge Instructions, Post Sedation.  Post Procedure Instructions:  The drugs you were given will stay in your system until tomorrow, so for the next 24 hours you should not drive, make any legal decisions or drink any alcoholic beverages.  You may eat anything you prefer, but it is better to start with liquids then soups and crackers, and gradually work up to solid foods.  Please notify your doctor immediately if you have any unusual bleeding, trouble breathing or pain that is not related to your normal pain.  Depending on the type of procedure that was done, some parts of your body may feel week and/or numb.  This usually clears up by tonight or the next day.  Walk with the use of an assistive device or accompanied by an adult for the 24 hours.  You may use ice on the affected area for the first 24 hours.  Put  ice in a Ziploc bag and cover with a towel and place against area 15 minutes on 15 minutes off.  You may switch to heat after 24 hours.

## 2015-03-06 NOTE — Progress Notes (Signed)
Subjective:    Patient ID: [name], female    DOB: [birth year], 69 y.o.   MRN: [id number]  HPI  PROCEDURE:  Block of nerves to the sacroiliac joint.   NOTE:  The patient is a 69 y.o. female who returns to the [facility] Management Center for further evaluation and treatment of pain involving the lower back and lower extremity region with pain in the region of the buttocks as well. Prior MRI studies reveal degenerative disc disease lumbar spine with degenerative changes lumbar spine L4-L5 with L3-4 level involvement predominantly. . The patient is reproduction of severe pain with palpation over the PSIS and PII S regions and is with positive Patrick's maneuver as well  There is concern regarding a significant component of the patient's pain being due to sacroiliac joint dysfunction The risks, benefits, expectations of the procedure have been discussed and explained to the patient who is understanding and willing to proceed with interventional treatment in attempt to decrease severity of patient's symptoms, minimize the risk of medication escalation and  hopefully retard the progression of the patient's symptoms. We will proceed with what is felt to be a medically necessary procedure, block of nerves to the sacroiliac joint.   DESCRIPTION OF PROCEDURE:  Block of nerves to the sacroiliac joint.   The patient was taken to the fluoroscopy suite. With the patient in the prone position with EKG, blood pressure, pulse and pulse oximetry monitoring, IV Versed, IV fentanyl conscious sedation, Betadine prep of proposed entry site was performed.   Block of nerves at the L5 vertebral body level.   With the patient in prone position, under fluoroscopic guidance, a 22 -gauge needle was inserted at the L5 vertebral body level on the left side. With 15 degrees oblique orientation a 22 -gauge needle was inserted in the region known as Burton's eye or eye of the Scotty dog. Following documentation of needle placement  in the area of Burton's eye or eye of the Scotty dog under fluoroscopic guidance, needle placement was then accomplished at the sacral ala level on the left side.   Needle placement at the sacral ala.   With the patient in prone position under fluoroscopic guidance with AP view of the lumbosacral spine, a 22 -gauge needle was inserted in the region known as the sacral ala on the left side. Following documentation of needle placement on the left side under fluoroscopic guidance needle placement was then accomplished at the S1 foramen level.   Needle placement at the S1 foramen level.   With the patient in prone position under fluoroscopic guidance with AP view of the lumbosacral spine and cephalad orientation, a 22 -gauge needle was inserted at the superior and lateral border of the S1 foramen on the left side. Following documentation of needle placement at the S1 foramen level on the left side, needle placement was then accomplished at the S2 foramen level on the left side.   Needle placement at the S2 foramen level.   With the patient in prone position with AP view of the lumbosacral spine with cephalad orientation, a 22 - gauge needle was inserted at the superior and lateral border of the S2 foramen under fluoroscopic guidance on the left side. Following needle placement at the L5 vertebral body level, sacral ala, S1 foramen and S2 foramen on the left side, needle placement was verified on lateral view under fluoroscopic guidance.  Following needle placement documentation on lateral view, each needle was injected with 1 mL of  0.25% bupivacaine and Kenalog.   BLOCK OF THE NERVES TO SACROILIAC JOINT ON THE RIGHT SIDE The procedure was performed on the right side at the same levels as was performed on the left side and utilizing the same technique as on the left side and was performed under fluoroscopic guidance as on the left side  Myoneural block injections of the gluteal musculature  region Following Betadine prep of proposed entry site a 22-gauge needle was inserted in the gluteal musculature region and following negative aspiration 2 cc of 0.25% bupivacaine with Norflex was injected for myoneural block injection 4 in the gluteal musculature region   A total of 10mg  of Kenalog was utilized for the procedure.   PLAN:  1. Medications: The patient will continue presently prescribed medications Topamax and oxycodone  2. The patient will be considered for modification of treatment regimen pending response to the procedure performed on today's visit.  3. The patient is to follow-up with primary care physician Dr.Tejan-Sie  for evaluation of blood pressure and general medical condition following the procedure performed on today's visit.  4. Surgical evaluation as discussed. Patient prefers to avoid surgical evaluation at this time. Patient may be considered for surgical evaluation pending response to treatment and follow-up evaluation  5. Neurological evaluation as discussed.May consider [facility] EMG studies and other studies pending response to treatment and follow-up evaluation  6. The patient may be a candidate for radiofrequency procedures, implantation devices and other treatment pending response to treatment performed on today's visit and follow-up evaluation.  7. The patient has been advised to adhere to proper body mechanics and to avoid activities which may exacerbate the patient's symptoms.   Return appointment to Pain Management Center as scheduled.    Review of Systems     Objective:   Physical Exam        Assessment & Plan:

## 2015-03-06 NOTE — Progress Notes (Signed)
Safety precautions to be maintained throughout the outpatient stay will include: orient to surroundings, keep bed in low position, maintain call bell within reach at all times, provide assistance with transfer out of bed and ambulation.  

## 2015-03-07 ENCOUNTER — Telehealth: Payer: Self-pay | Admitting: *Deleted

## 2015-03-07 ENCOUNTER — Other Ambulatory Visit: Payer: Self-pay | Admitting: Pain Medicine

## 2015-03-07 NOTE — Telephone Encounter (Signed)
No problems post procedure. 

## 2015-03-20 ENCOUNTER — Ambulatory Visit: Payer: Medicare Other | Attending: Pain Medicine | Admitting: Pain Medicine

## 2015-03-20 ENCOUNTER — Encounter: Payer: Self-pay | Admitting: Pain Medicine

## 2015-03-20 VITALS — BP 108/68 | HR 66 | Temp 98.3°F | Resp 16 | Ht 66.0 in | Wt 132.0 lb

## 2015-03-20 DIAGNOSIS — F4310 Post-traumatic stress disorder, unspecified: Secondary | ICD-10-CM

## 2015-03-20 DIAGNOSIS — M47812 Spondylosis without myelopathy or radiculopathy, cervical region: Secondary | ICD-10-CM

## 2015-03-20 DIAGNOSIS — M5136 Other intervertebral disc degeneration, lumbar region: Secondary | ICD-10-CM | POA: Insufficient documentation

## 2015-03-20 DIAGNOSIS — M5481 Occipital neuralgia: Secondary | ICD-10-CM | POA: Insufficient documentation

## 2015-03-20 DIAGNOSIS — G43109 Migraine with aura, not intractable, without status migrainosus: Secondary | ICD-10-CM

## 2015-03-20 DIAGNOSIS — M48061 Spinal stenosis, lumbar region without neurogenic claudication: Secondary | ICD-10-CM

## 2015-03-20 DIAGNOSIS — M47816 Spondylosis without myelopathy or radiculopathy, lumbar region: Secondary | ICD-10-CM | POA: Diagnosis not present

## 2015-03-20 DIAGNOSIS — M542 Cervicalgia: Secondary | ICD-10-CM | POA: Diagnosis present

## 2015-03-20 DIAGNOSIS — M533 Sacrococcygeal disorders, not elsewhere classified: Secondary | ICD-10-CM | POA: Diagnosis not present

## 2015-03-20 DIAGNOSIS — M546 Pain in thoracic spine: Secondary | ICD-10-CM | POA: Diagnosis present

## 2015-03-20 DIAGNOSIS — R51 Headache: Secondary | ICD-10-CM | POA: Diagnosis present

## 2015-03-20 DIAGNOSIS — M4804 Spinal stenosis, thoracic region: Secondary | ICD-10-CM

## 2015-03-20 MED ORDER — TOPIRAMATE 25 MG PO TABS: ORAL_TABLET | ORAL | Status: DC

## 2015-03-20 MED ORDER — OXYCODONE-ACETAMINOPHEN 7.5-325 MG PO TABS: ORAL_TABLET | ORAL | Status: DC

## 2015-03-20 NOTE — Progress Notes (Signed)
.    Subjective:    Patient ID: [name], female    DOB: [birth year], 69 y.o.   MRN: [id number]  HPI  The patient is a 69 year old female who [facility] for further evaluation and treatment of pain involving the neck headaches upper mid and lower back and lower extremity regions. Patient says significant improvement of her pain following block of nerves to the sacroiliac joint. Patient states she is doing remarkably well at this time and is without any significant pain interfering with all activities of daily living. We will continue Topamax oxycodone at this time and will remain available to consider modification of treatment regimen should such felt to be necessary. The patient states she is able to sleep without significant pain interfering with ability to obtain restful sleep and she is able to perform activities of daily living without significant pain interfering for activities of daily living. We will continue presently prescribed medications and consider patient for additional modification of treatment pending response to treatment and follow-up evaluation. The patient was understanding and in agreement with suggested treatment plan       Review of Systems     Objective:   Physical Exam  There was tenderness of the splenius capitis and occipitalis musculature regions of minimal degree. There was minimal tenderness of the cervical facet cervical paraspinal musculature region and the thoracic facet and thoracic paraspinal musculature region and crepitus of the thoracic region was noted. Tinel and Phalen's maneuver without increased pain of significant degree and patient appeared to be with slightly decreased grip strength. Patient appeared to be with unremarkable Spurling's maneuver. There was minimal tenderness of the acromioclavicular and glenohumeral joint regions. Palpation over the lumbar paraspinal musculature region lumbar facet region was a tends to palpation  of mild degree. Lateral bending rotation was without increased pain of significant degree. There was mild tends of the PSIS and PII S regions as well as the greater trochanteric region and iliotibial band region. No sensory deficit of dermatomal distribution was detected. There was negative clonus negative Homans. There was minimal tenderness to palpation of the gluteal and piriformis musculature regions. Abdomen was nontender with no costovertebral angle tenderness noted.           Assessment & Plan:     Sacroiliac joint dysfunction  Degenerative disc disease lumbar spine L4-5 L3-4 degenerative changes predominantly  Degenerative disc disease of the cervical spine  Cervical facet syndrome  Bilateral occipital neuralgia     PLAN    Continue present medication Topamax oxycodone acetaminophen  F/U PCP for evaliation of  BP and general medical  condition  F/U surgical evaluation. May consider pending follow-up evaluations  F/U neurological evaluation. May consider pending follow-up evaluations  May consider radiofrequency rhizolysis or intraspinal procedures pending response to present treatment and F/U evaluation   Patient to call [facility] should patient have concerns prior to scheduled return appointment.

## 2015-03-20 NOTE — Patient Instructions (Addendum)
  PLAN   Continue present medication Topamax and oxycodone acetaminophen  F/U PCP  [facility] for evaliation of  BP and general medical  condition and for evaluation as discussed  F/U surgical evaluation. May consider pending follow-up evaluations. We will avoid at this time   F/U neurological evaluation. May consider pending follow-up evaluations  May consider radiofrequency rhizolysis or intraspinal procedures pending response to present treatment and F/U evaluation   Patient to call [facility] should patient have concerns prior to scheduled return appointment.

## 2015-04-18 ENCOUNTER — Encounter: Payer: Self-pay | Admitting: Pain Medicine

## 2015-04-18 ENCOUNTER — Ambulatory Visit: Payer: Medicare Other | Attending: Pain Medicine | Admitting: Pain Medicine

## 2015-04-18 VITALS — BP 95/71 | HR 65 | Temp 97.8°F | Resp 16 | Ht 66.0 in | Wt 133.0 lb

## 2015-04-18 DIAGNOSIS — G43109 Migraine with aura, not intractable, without status migrainosus: Secondary | ICD-10-CM

## 2015-04-18 DIAGNOSIS — R51 Headache: Secondary | ICD-10-CM | POA: Diagnosis present

## 2015-04-18 DIAGNOSIS — M5481 Occipital neuralgia: Secondary | ICD-10-CM | POA: Insufficient documentation

## 2015-04-18 DIAGNOSIS — M5136 Other intervertebral disc degeneration, lumbar region: Secondary | ICD-10-CM | POA: Diagnosis not present

## 2015-04-18 DIAGNOSIS — M545 Low back pain: Secondary | ICD-10-CM | POA: Diagnosis present

## 2015-04-18 DIAGNOSIS — M4804 Spinal stenosis, thoracic region: Secondary | ICD-10-CM

## 2015-04-18 DIAGNOSIS — M533 Sacrococcygeal disorders, not elsewhere classified: Secondary | ICD-10-CM | POA: Insufficient documentation

## 2015-04-18 DIAGNOSIS — M47812 Spondylosis without myelopathy or radiculopathy, cervical region: Secondary | ICD-10-CM

## 2015-04-18 DIAGNOSIS — M542 Cervicalgia: Secondary | ICD-10-CM | POA: Diagnosis present

## 2015-04-18 DIAGNOSIS — M47816 Spondylosis without myelopathy or radiculopathy, lumbar region: Secondary | ICD-10-CM | POA: Diagnosis not present

## 2015-04-18 DIAGNOSIS — M48061 Spinal stenosis, lumbar region without neurogenic claudication: Secondary | ICD-10-CM

## 2015-04-18 MED ORDER — OXYCODONE-ACETAMINOPHEN 7.5-325 MG PO TABS: ORAL_TABLET | ORAL | Status: DC

## 2015-04-18 MED ORDER — TOPIRAMATE 25 MG PO TABS: ORAL_TABLET | ORAL | Status: DC

## 2015-04-18 NOTE — Progress Notes (Signed)
Safety precautions to be maintained throughout the outpatient stay will include: orient to surroundings, keep bed in low position, maintain call bell within reach at all times, provide assistance with transfer out of bed and ambulation.   Patient here for medication refill 

## 2015-04-18 NOTE — Progress Notes (Signed)
   Subjective:    Patient ID: [name], female    DOB: [birth year], 69 y.o.   MRN: [id number]  HPI  Patient is a 69 year old female who returns to [facility] for further evaluation and treatment of pain involving the neck associated with headaches as well as pain involving the mid lower back and lower extremity region. The patient states that she is doing remarkably well at this time. The patient denies any trauma change in events of daily living the call significant change in symptomatology. The patient continues to benefit from block of nerves to the sacroiliac joint. The patient also is exercising and states that her pain is well-controlled since she underwent interventional treatments block of nerves to the sacroiliac joint. The patient will continue Topamax and oxycodone acetaminophen and we will remain available to consider modification treat should they be significant change of patient's condition. The patient was understanding and agreement suggested treatment plan      Review of Systems     Objective:   Physical Exam  There was tends to palpation of paraspinal muscular region cervical and cervical facet region palpation which reproduces minimal discomfort. There was minimal tenderness to palpation of the splenius capitis and occipitalis musculature regions. No masses of the head and neck were noted. There was no excessive tends to palpation over the acromioclavicular and glenohumeral joint regions. There appeared to be unremarkable Spurling's maneuver. Patient appeared to be with bilaterally equal grip strength. Tinel and Phalen's maneuver were without increased pain of significant degree. There was no crepitus of the thoracic region with palpation over the thoracic paraspinal must reason thoracic facet region reproducing minimal discomfort. Palpation over the lumbar paraspinal musculature region lumbar facet region was associated with minimal discomfort. Lateral bending  rotation extension and palpation of the lumbar facets reproduced minimal discomfort. There was tends to palpation of the PSIS and PII S region of minimal degree. Straight leg raising was tolerates approximately 30 without increased pain with dorsiflexion noted. DTRs appeared to be trace at the knees. There was negative clonus negative Homans. There was minimal tense to palpation of greater trochanteric region iliotibial band region. Abdomen nontender with no costovertebral tenderness noted.        Assessment & Plan:    Sacroiliac joint dysfunction  Degenerative disc disease lumbar spine L4-5 L3-4 degenerative changes predominantly  Degenerative disc disease of the cervical spine  Cervical facet syndrome  Bilateral occipital neuralgia     PLAN    Continue present medication Topamax and oxycodone acetaminophen  F/U PCP  [facility] for evaliation of  BP and general medical  condition and for evaluation as discussed  F/U surgical evaluation. May consider pending follow-up evaluations. We will avoid at this time   F/U neurological evaluation. May consider pending follow-up evaluations  May consider radiofrequency rhizolysis or intraspinal procedures pending response to present treatment and F/U evaluation   Patient to call [facility] should patient have concerns prior to scheduled return appointment

## 2015-05-15 ENCOUNTER — Encounter: Payer: Medicare Other | Admitting: Pain Medicine

## 2015-05-16 ENCOUNTER — Encounter: Payer: Self-pay | Admitting: Pain Medicine

## 2015-05-16 ENCOUNTER — Ambulatory Visit: Payer: Medicare Other | Attending: Pain Medicine | Admitting: Pain Medicine

## 2015-05-16 VITALS — BP 154/45 | HR 91 | Temp 98.4°F | Resp 16 | Ht 66.0 in | Wt 133.0 lb

## 2015-05-16 DIAGNOSIS — M48061 Spinal stenosis, lumbar region without neurogenic claudication: Secondary | ICD-10-CM

## 2015-05-16 DIAGNOSIS — M4804 Spinal stenosis, thoracic region: Secondary | ICD-10-CM

## 2015-05-16 DIAGNOSIS — M5030 Other cervical disc degeneration, unspecified cervical region: Secondary | ICD-10-CM | POA: Insufficient documentation

## 2015-05-16 DIAGNOSIS — M47816 Spondylosis without myelopathy or radiculopathy, lumbar region: Secondary | ICD-10-CM | POA: Diagnosis not present

## 2015-05-16 DIAGNOSIS — M5481 Occipital neuralgia: Secondary | ICD-10-CM | POA: Diagnosis not present

## 2015-05-16 DIAGNOSIS — R51 Headache: Secondary | ICD-10-CM | POA: Diagnosis present

## 2015-05-16 DIAGNOSIS — M5136 Other intervertebral disc degeneration, lumbar region: Secondary | ICD-10-CM | POA: Insufficient documentation

## 2015-05-16 DIAGNOSIS — M533 Sacrococcygeal disorders, not elsewhere classified: Secondary | ICD-10-CM | POA: Diagnosis not present

## 2015-05-16 DIAGNOSIS — M47812 Spondylosis without myelopathy or radiculopathy, cervical region: Secondary | ICD-10-CM

## 2015-05-16 DIAGNOSIS — M542 Cervicalgia: Secondary | ICD-10-CM | POA: Diagnosis present

## 2015-05-16 MED ORDER — OXYCODONE-ACETAMINOPHEN 7.5-325 MG PO TABS: ORAL_TABLET | ORAL | Status: DC

## 2015-05-16 MED ORDER — TOPIRAMATE 25 MG PO TABS: ORAL_TABLET | ORAL | Status: DC

## 2015-05-16 NOTE — Progress Notes (Signed)
   Subjective:    Patient ID: [name], female    DOB: February 26, 1946, [age] y.o.   MRN: [id number]  HPI The patient is a 69 year old female who returns to pain management for further evaluation and treatment of pain involving headaches with pain radiating from the neck to the back of the head. Patient also is with pain involving the mid and lower back regions which patient states are of a lesser intensity. The patient is with history of migraine headache with greater occipital neuralgia myofascial pain related headaches as well. We discussed patient's condition and will consider patient for greater occipital nerve blocks at time return appointment in attempt to decrease severity of symptoms, minimize progression of symptoms, and avoid need for more involved treatment. The patient was in agreement suggested treatment plan. The patient will continue Topamax and oxycodone acetaminophen as prescribed at this time. All were in agreement with suggested treatment plan.     Review of Systems     Objective:   Physical Exam  There was tenderness to palpation of the splenius capitis and occipitalis musculature regions a moderate to moderately severe degree. There was tenderness over the cervical facet cervical paraspinal musculature region and thoracic facet thoracic paraspinal musculature region with no crepitus of the thoracic region noted. Palpation of the acromioclavicular and glenohumeral joint regions reproduces minimal discomfort and there appeared to be unremarkable Spurling's maneuver. Patient was with minimal increased pain with Tinel and Phalen's maneuver and appeared to be with slightly decreased grip strength. There was tenderness over the thoracic facet thoracic paraspinal musculature region with moderate tenderness to palpation of the lower thoracic paraspinal musculature region. Palpation over the lumbar paraspinal musculatures and lumbar facet region was with moderate tenderness to palpation  extension and palpation of the lumbar facets reproduced moderate discomfort. Straight leg raise was tolerates approximately 20 without increased pain with dorsiflexion noted. There appeared to be negative clonus negative Homans. No sensory deficit or dermatomal distribution detected. Abdomen was nontender with no costovertebral angle tenderness noted.. Palpation of the splenius capitis and occipitalis musculature regions reproduces predominant portion of patient's pain precipitating headaches    Assessment & Plan:   Sacroiliac joint dysfunction  Degenerative disc disease lumbar spine L4-5 L3-4 degenerative changes predominantly  Degenerative disc disease of the cervical spine  Cervical facet syndrome  Bilateral occipital neuralgia     appointment.       PLAN   Continue present medication Topamax and oxycodone acetaminophen  Greater occipital nerve block to be performed at time return appointment  F/U PCP  [facility] for evaliation of  BP and general medical  condition and for evaluation as discussed  F/U surgical evaluation. May consider pending follow-up evaluations. We will avoid at this time   F/U neurological evaluation. May consider pending follow-up evaluations  May consider radiofrequency rhizolysis or intraspinal procedures pending response to present treatment and F/U evaluation . We will avoid considering these procedures at this time  Patient to call [facility] should patient have concerns prior to scheduled return appointment

## 2015-05-16 NOTE — Patient Instructions (Addendum)
PLAN   Continue present medication Topamax and oxycodone acetaminophen  Greater occipital nerve block to be performed at time return appointment  F/U PCP  [facility] for evaliation of  BP and general medical  condition and for evaluation as discussed  F/U surgical evaluation. May consider pending follow-up evaluations. We will avoid at this time   F/U neurological evaluation. May consider pending follow-up evaluations  May consider radiofrequency rhizolysis or intraspinal procedures pending response to present treatment and F/U evaluation . We will avoid considering these procedures at this time  Patient to call [facility] should patient have concerns prior to scheduled return appointment

## 2015-05-16 NOTE — Progress Notes (Signed)
Safety precautions to be maintained throughout the outpatient stay will include: orient to surroundings, keep bed in low position, maintain call bell within reach at all times, provide assistance with transfer out of bed and ambulation.   Patient here for medication refills.

## 2015-05-27 ENCOUNTER — Encounter: Payer: Medicare Other | Admitting: Pain Medicine

## 2015-05-29 ENCOUNTER — Encounter: Payer: Medicare Other | Admitting: Pain Medicine

## 2015-06-13 ENCOUNTER — Inpatient Hospital Stay
Admission: EM | Admit: 2015-06-13 | Discharge: 2015-06-14 | DRG: 690 | Disposition: A | Discharge status: Home Health | Payer: Medicare Other | Attending: Internal Medicine | Admitting: Internal Medicine

## 2015-06-13 ENCOUNTER — Other Ambulatory Visit: Payer: Self-pay

## 2015-06-13 ENCOUNTER — Emergency Department: Payer: Medicare Other

## 2015-06-13 ENCOUNTER — Ambulatory Visit: Payer: Medicare Other | Admitting: Pain Medicine

## 2015-06-13 ENCOUNTER — Encounter: Payer: Self-pay | Admitting: Pain Medicine

## 2015-06-13 ENCOUNTER — Encounter: Payer: Self-pay | Admitting: *Deleted

## 2015-06-13 VITALS — BP 140/87 | HR 125 | Temp 98.2°F | Resp 16 | Ht 66.0 in | Wt 132.0 lb

## 2015-06-13 DIAGNOSIS — M797 Fibromyalgia: Secondary | ICD-10-CM | POA: Diagnosis present

## 2015-06-13 DIAGNOSIS — M47816 Spondylosis without myelopathy or radiculopathy, lumbar region: Secondary | ICD-10-CM

## 2015-06-13 DIAGNOSIS — M4804 Spinal stenosis, thoracic region: Secondary | ICD-10-CM

## 2015-06-13 DIAGNOSIS — E876 Hypokalemia: Secondary | ICD-10-CM | POA: Diagnosis present

## 2015-06-13 DIAGNOSIS — Z79891 Long term (current) use of opiate analgesic: Secondary | ICD-10-CM

## 2015-06-13 DIAGNOSIS — M533 Sacrococcygeal disorders, not elsewhere classified: Secondary | ICD-10-CM

## 2015-06-13 DIAGNOSIS — M5481 Occipital neuralgia: Secondary | ICD-10-CM

## 2015-06-13 DIAGNOSIS — R000 Tachycardia, unspecified: Secondary | ICD-10-CM | POA: Diagnosis not present

## 2015-06-13 DIAGNOSIS — G43109 Migraine with aura, not intractable, without status migrainosus: Secondary | ICD-10-CM

## 2015-06-13 DIAGNOSIS — M47812 Spondylosis without myelopathy or radiculopathy, cervical region: Secondary | ICD-10-CM

## 2015-06-13 DIAGNOSIS — F4310 Post-traumatic stress disorder, unspecified: Secondary | ICD-10-CM | POA: Diagnosis present

## 2015-06-13 DIAGNOSIS — I200 Unstable angina: Secondary | ICD-10-CM | POA: Diagnosis present

## 2015-06-13 DIAGNOSIS — Z7951 Long term (current) use of inhaled steroids: Secondary | ICD-10-CM

## 2015-06-13 DIAGNOSIS — Z8249 Family history of ischemic heart disease and other diseases of the circulatory system: Secondary | ICD-10-CM

## 2015-06-13 DIAGNOSIS — M4806 Spinal stenosis, lumbar region: Secondary | ICD-10-CM | POA: Diagnosis present

## 2015-06-13 DIAGNOSIS — Z87891 Personal history of nicotine dependence: Secondary | ICD-10-CM | POA: Diagnosis not present

## 2015-06-13 DIAGNOSIS — K589 Irritable bowel syndrome without diarrhea: Secondary | ICD-10-CM | POA: Diagnosis present

## 2015-06-13 DIAGNOSIS — J209 Acute bronchitis, unspecified: Secondary | ICD-10-CM | POA: Diagnosis present

## 2015-06-13 DIAGNOSIS — M5136 Other intervertebral disc degeneration, lumbar region: Secondary | ICD-10-CM | POA: Diagnosis present

## 2015-06-13 DIAGNOSIS — F322 Major depressive disorder, single episode, severe without psychotic features: Secondary | ICD-10-CM | POA: Diagnosis present

## 2015-06-13 DIAGNOSIS — N390 Urinary tract infection, site not specified: Secondary | ICD-10-CM

## 2015-06-13 DIAGNOSIS — Z833 Family history of diabetes mellitus: Secondary | ICD-10-CM

## 2015-06-13 DIAGNOSIS — I10 Essential (primary) hypertension: Secondary | ICD-10-CM | POA: Diagnosis present

## 2015-06-13 DIAGNOSIS — N3001 Acute cystitis with hematuria: Secondary | ICD-10-CM | POA: Diagnosis not present

## 2015-06-13 DIAGNOSIS — I209 Angina pectoris, unspecified: Secondary | ICD-10-CM | POA: Diagnosis present

## 2015-06-13 DIAGNOSIS — M5382 Other specified dorsopathies, cervical region: Secondary | ICD-10-CM | POA: Diagnosis present

## 2015-06-13 DIAGNOSIS — Z888 Allergy status to other drugs, medicaments and biological substances status: Secondary | ICD-10-CM

## 2015-06-13 DIAGNOSIS — E119 Type 2 diabetes mellitus without complications: Secondary | ICD-10-CM | POA: Diagnosis present

## 2015-06-13 DIAGNOSIS — Z7982 Long term (current) use of aspirin: Secondary | ICD-10-CM

## 2015-06-13 DIAGNOSIS — M48061 Spinal stenosis, lumbar region without neurogenic claudication: Secondary | ICD-10-CM

## 2015-06-13 DIAGNOSIS — Z818 Family history of other mental and behavioral disorders: Secondary | ICD-10-CM

## 2015-06-13 DIAGNOSIS — E039 Hypothyroidism, unspecified: Secondary | ICD-10-CM | POA: Diagnosis present

## 2015-06-13 DIAGNOSIS — M51369 Other intervertebral disc degeneration, lumbar region without mention of lumbar back pain or lower extremity pain: Secondary | ICD-10-CM

## 2015-06-13 DIAGNOSIS — R42 Dizziness and giddiness: Secondary | ICD-10-CM

## 2015-06-13 DIAGNOSIS — A419 Sepsis, unspecified organism: Secondary | ICD-10-CM

## 2015-06-13 DIAGNOSIS — Z7984 Long term (current) use of oral hypoglycemic drugs: Secondary | ICD-10-CM

## 2015-06-13 LAB — CBC
HCT: 43.9 % (ref 35.0–47.0)
Hemoglobin: 14.2 g/dL (ref 12.0–16.0)
MCH: 31 pg (ref 26.0–34.0)
MCHC: 32.4 g/dL (ref 32.0–36.0)
MCV: 95.6 fL (ref 80.0–100.0)
PLATELETS: 418 10*3/uL (ref 150–440)
RBC: 4.59 MIL/uL (ref 3.80–5.20)
RDW: 13.8 % (ref 11.5–14.5)
WBC: 12.3 10*3/uL — ABNORMAL HIGH (ref 3.6–11.0)

## 2015-06-13 LAB — BASIC METABOLIC PANEL
Anion gap: 10 (ref 5–15)
BUN: 8 mg/dL (ref 6–20)
CALCIUM: 9.3 mg/dL (ref 8.9–10.3)
CO2: 33 mmol/L — AB (ref 22–32)
CREATININE: 0.76 mg/dL (ref 0.44–1.00)
Chloride: 91 mmol/L — ABNORMAL LOW (ref 101–111)
GFR calc Af Amer: >60 mL/min (ref >60)
GFR calc non Af Amer: >60 mL/min (ref >60)
GLUCOSE: 185 mg/dL — AB (ref 65–99)
Potassium: 2.9 mmol/L — CL (ref 3.5–5.1)
Sodium: 134 mmol/L — ABNORMAL LOW (ref 135–145)

## 2015-06-13 LAB — URINALYSIS COMPLETE WITH MICROSCOPIC (ARMC ONLY)
BILIRUBIN URINE: NEGATIVE
Glucose, UA: NEGATIVE mg/dL
Ketones, ur: NEGATIVE mg/dL
Nitrite: NEGATIVE
PH: 6 (ref 5.0–8.0)
Protein, ur: NEGATIVE mg/dL
Specific Gravity, Urine: 1.008 (ref 1.005–1.030)

## 2015-06-13 LAB — GLUCOSE, CAPILLARY: Glucose-Capillary: 134 mg/dL — ABNORMAL HIGH (ref 65–99)

## 2015-06-13 LAB — T4, FREE: Free T4: 1.85 ng/dL — ABNORMAL HIGH (ref 0.61–1.12)

## 2015-06-13 LAB — MAGNESIUM: Magnesium: 1.7 mg/dL (ref 1.7–2.4)

## 2015-06-13 LAB — TSH: TSH: 0.118 u[IU]/mL — ABNORMAL LOW (ref 0.350–4.500)

## 2015-06-13 LAB — TROPONIN I

## 2015-06-13 MED ORDER — OXYCODONE-ACETAMINOPHEN 7.5-325 MG PO TABS: ORAL_TABLET | ORAL | Status: DC

## 2015-06-13 MED ORDER — LORATADINE 10 MG PO TABS
10.0000 mg | ORAL_TABLET | Freq: Every day | ORAL | Status: DC
  Administered 2015-06-14: 10 mg via ORAL
  Filled 2015-06-13: qty 1

## 2015-06-13 MED ORDER — METOPROLOL TARTRATE 25 MG PO TABS
25.0000 mg | ORAL_TABLET | Freq: Once | ORAL | Status: DC
  Filled 2015-06-13: qty 1

## 2015-06-13 MED ORDER — MAGNESIUM SULFATE 2 GM/50ML IV SOLN
2.0000 g | Freq: Once | INTRAVENOUS | Status: AC
  Administered 2015-06-13: 2 g via INTRAVENOUS
  Filled 2015-06-13: qty 50

## 2015-06-13 MED ORDER — POTASSIUM CHLORIDE 10 MEQ/100ML IV SOLN
10.0000 meq | INTRAVENOUS | Status: AC
  Administered 2015-06-13 (×4): 10 meq via INTRAVENOUS
  Filled 2015-06-13 (×5): qty 100

## 2015-06-13 MED ORDER — ACETAMINOPHEN 325 MG PO TABS: 650.0000 mg | ORAL_TABLET | Freq: Four times a day (QID) | ORAL | Status: DC | PRN

## 2015-06-13 MED ORDER — ALUM & MAG HYDROXIDE-SIMETH 200-200-20 MG/5ML PO SUSP: 30.0000 mL | Freq: Four times a day (QID) | ORAL | Status: DC | PRN

## 2015-06-13 MED ORDER — ONDANSETRON HCL 4 MG PO TABS: 4.0000 mg | ORAL_TABLET | Freq: Four times a day (QID) | ORAL | Status: DC | PRN

## 2015-06-13 MED ORDER — HYDROCODONE-ACETAMINOPHEN 5-325 MG PO TABS
1.0000 | ORAL_TABLET | ORAL | Status: DC | PRN
  Administered 2015-06-13: 22:00:00 1 via ORAL
  Administered 2015-06-14: 15:00:00 2 via ORAL
  Administered 2015-06-14: 1 via ORAL
  Filled 2015-06-13: qty 2
  Filled 2015-06-13 (×2): qty 1

## 2015-06-13 MED ORDER — FLUOXETINE HCL 20 MG PO CAPS
60.0000 mg | ORAL_CAPSULE | Freq: Every day | ORAL | Status: DC
  Administered 2015-06-13: 60 mg via ORAL
  Filled 2015-06-13: qty 3

## 2015-06-13 MED ORDER — TOPIRAMATE 25 MG PO TABS: ORAL_TABLET | ORAL | Status: DC

## 2015-06-13 MED ORDER — ENOXAPARIN SODIUM 40 MG/0.4ML ~~LOC~~ SOLN: 40.0000 mg | SUBCUTANEOUS | Status: DC

## 2015-06-13 MED ORDER — HYDROCORTISONE 2.5 % RE CREA
1.0000 "application " | TOPICAL_CREAM | Freq: Three times a day (TID) | RECTAL | Status: DC | PRN
  Filled 2015-06-13: qty 28.35

## 2015-06-13 MED ORDER — NITROGLYCERIN 0.4 MG SL SUBL: 0.4000 mg | SUBLINGUAL_TABLET | SUBLINGUAL | Status: DC | PRN

## 2015-06-13 MED ORDER — ACETAMINOPHEN 650 MG RE SUPP: 650.0000 mg | Freq: Four times a day (QID) | RECTAL | Status: DC | PRN

## 2015-06-13 MED ORDER — MOMETASONE FURO-FORMOTEROL FUM 100-5 MCG/ACT IN AERO
2.0000 | INHALATION_SPRAY | Freq: Two times a day (BID) | RESPIRATORY_TRACT | Status: DC
  Administered 2015-06-13 – 2015-06-14 (×2): 2 via RESPIRATORY_TRACT
  Filled 2015-06-13: qty 8.8

## 2015-06-13 MED ORDER — SENNOSIDES-DOCUSATE SODIUM 8.6-50 MG PO TABS: 1.0000 | ORAL_TABLET | Freq: Every evening | ORAL | Status: DC | PRN

## 2015-06-13 MED ORDER — TOPIRAMATE 25 MG PO TABS
25.0000 mg | ORAL_TABLET | Freq: Every day | ORAL | Status: DC
  Filled 2015-06-13: qty 1

## 2015-06-13 MED ORDER — POTASSIUM CHLORIDE CRYS ER 20 MEQ PO TBCR
40.0000 meq | EXTENDED_RELEASE_TABLET | Freq: Once | ORAL | Status: AC
  Administered 2015-06-13: 40 meq via ORAL
  Filled 2015-06-13: qty 2

## 2015-06-13 MED ORDER — SODIUM CHLORIDE 0.9 % IV BOLUS (SEPSIS)
500.0000 mL | Freq: Once | INTRAVENOUS | Status: AC
  Administered 2015-06-13: 500 mL via INTRAVENOUS

## 2015-06-13 MED ORDER — ONDANSETRON HCL 4 MG/2ML IJ SOLN: 4.0000 mg | Freq: Four times a day (QID) | INTRAMUSCULAR | Status: DC | PRN

## 2015-06-13 MED ORDER — INSULIN ASPART 100 UNIT/ML ~~LOC~~ SOLN
0.0000 [IU] | Freq: Three times a day (TID) | SUBCUTANEOUS | Status: DC
  Administered 2015-06-14: 12:00:00 1 [IU] via SUBCUTANEOUS
  Filled 2015-06-13: qty 1

## 2015-06-13 MED ORDER — DEXTROSE 5 % IV SOLN
1.0000 g | INTRAVENOUS | Status: DC
  Filled 2015-06-13: qty 10

## 2015-06-13 MED ORDER — METOPROLOL TARTRATE 25 MG PO TABS
25.0000 mg | ORAL_TABLET | Freq: Two times a day (BID) | ORAL | Status: DC
  Administered 2015-06-13 – 2015-06-14 (×2): 25 mg via ORAL
  Filled 2015-06-13 (×2): qty 1

## 2015-06-13 MED ORDER — LEVOTHYROXINE SODIUM 125 MCG PO TABS
125.0000 ug | ORAL_TABLET | Freq: Every day | ORAL | Status: DC
  Administered 2015-06-14: 125 ug via ORAL
  Filled 2015-06-13: qty 1

## 2015-06-13 MED ORDER — TIOTROPIUM BROMIDE MONOHYDRATE 18 MCG IN CAPS
18.0000 ug | ORAL_CAPSULE | Freq: Every day | RESPIRATORY_TRACT | Status: DC
  Administered 2015-06-14: 09:00:00 18 ug via RESPIRATORY_TRACT
  Filled 2015-06-13: qty 5

## 2015-06-13 MED ORDER — INSULIN ASPART 100 UNIT/ML ~~LOC~~ SOLN: 0.0000 [IU] | Freq: Every day | SUBCUTANEOUS | Status: DC

## 2015-06-13 MED ORDER — CLONAZEPAM 1 MG PO TABS
1.0000 mg | ORAL_TABLET | Freq: Three times a day (TID) | ORAL | Status: DC
  Administered 2015-06-13 – 2015-06-14 (×2): 1 mg via ORAL
  Filled 2015-06-13 (×2): qty 1

## 2015-06-13 MED ORDER — SODIUM CHLORIDE 0.9% FLUSH
3.0000 mL | Freq: Two times a day (BID) | INTRAVENOUS | Status: DC
  Administered 2015-06-13 – 2015-06-14 (×2): 3 mL via INTRAVENOUS

## 2015-06-13 MED ORDER — CEFTRIAXONE SODIUM 1 G IJ SOLR
1.0000 g | Freq: Once | INTRAMUSCULAR | Status: AC
  Administered 2015-06-13: 1 g via INTRAVENOUS
  Filled 2015-06-13: qty 10

## 2015-06-13 NOTE — ED Notes (Signed)
Pt arrives from PCP, was at a regualr office visit and was sent to ER for tachycardia and abnormal blood pressure, upon arrival pt deneis any CP or SOB, really unable to state why she was sent to ER besides "my Blood pressure", pt HR 130 upon arrival, awake and alert in no distress

## 2015-06-13 NOTE — H&P (Signed)
[facility] [place] at [facility]   PATIENT NAME: [name]    MR#:  [id number]  DATE OF BIRTH:  01-20-1946  DATE OF ADMISSION:  06/13/2015  PRIMARY CARE PHYSICIAN: Luna Fuse, MD   REQUESTING/REFERRING PHYSICIAN:   CHIEF COMPLAINT:  Tachycardia syndrome from PCP office HISTORY OF PRESENT ILLNESS:  [name]  is a 70 y.o. female with a known history of diabetes and irritable bowel syndrome who presented to her primary care physician's office for follow-up and was found to have tachycardia and UTI symptoms. Due to her tachycardia she was asked to come to the emergency room for further evaluation. Emergency room her heart rate was up to 130s. She is found to have a urinary tract infection. She does state that she has dysuria, frequency and urgency.  Of note patient's daughter on the side told me she is very concerned about her mother's lack of involvement in her medical condition and noncompliance with medications. PAST MEDICAL HISTORY:   Past Medical History  Diagnosis Date  . Sinus problem   . Fibromyalgia   . Chest pain   . CHF (congestive heart failure) (HCC)   . IBS (irritable bowel syndrome)   . Depression   . Anxiety   . Diabetes mellitus without complication (HCC)   . Spinal stenosis   . PTSD (post-traumatic stress disorder)   . Hypertension   . Graves disease     PAST SURGICAL HISTORY:   Past Surgical History  Procedure Laterality Date  . Tonsillectomy and adenoidectomy    . Toenail removal Bilateral     great toes  . Abdominal hysterectomy    . Rectal surgery    . Esophagogastroduodenoscopy (egd) with propofol N/A 01/10/2015    Procedure: ESOPHAGOGASTRODUODENOSCOPY (EGD) WITH PROPOFOL;  Surgeon: Wallace Cullens, MD;  Location: [facility] ENDOSCOPY;  Service: Gastroenterology;  Laterality: N/A;  . Savory dilation N/A 01/10/2015    Procedure: SAVORY DILATION;  Surgeon: Wallace Cullens, MD;  Location: [facility] ENDOSCOPY;  Service:  Gastroenterology;  Laterality: N/A;    SOCIAL HISTORY:   Social History  Substance Use Topics  . Smoking status: Former Smoker -- 25 years    Types: Cigarettes    Quit date: 12/17/2011  . Smokeless tobacco: Never Used  . Alcohol Use: No    FAMILY HISTORY:   Family History  Problem Relation Age of Onset  . Alcohol abuse Mother   . Depression Mother   . Varicose Veins Mother   . Alcohol abuse Father   . Depression Father   . Alcohol abuse Sister   . Asthma Sister   . COPD Sister   . Depression Sister   . Heart disease Sister   . Hypertension Sister   . Depression Maternal Grandmother   . Diabetes Paternal Grandmother   . Stroke Paternal Grandmother     DRUG ALLERGIES:   Allergies  Allergen Reactions  . Celecoxib     Other reaction(s): Other (qualifier value) Patient is allergic to [facility] and found that it caused heart failure. CHF  . Pregabalin     Other reaction(s): Localized superficial swelling of skin     REVIEW OF SYSTEMS:  CONSTITUTIONAL: No fever, fatigue ++ generalized weakness.  EYES: No blurred or double vision.  EARS, NOSE, AND THROAT: No tinnitus or ear pain.  RESPIRATORY: No cough, shortness of breath, wheezing or hemoptysis.  CARDIOVASCULAR: No chest pain, orthopnea, edema.  GASTROINTESTINAL: No nausea, vomiting, diarrhea or abdominal pain.  GENITOURINARY: Positive  for frequency, urgency and dysuria, no hematuria.  ENDOCRINE: No polyuria, nocturia,  HEMATOLOGY: No anemia, easy bruising or bleeding SKIN: No rash or lesion. MUSCULOSKELETAL: No joint pain or arthritis.   NEUROLOGIC: No tingling, numbness, weakness.  PSYCHIATRY: No anxiety or depression.   MEDICATIONS AT HOME:   Prior to Admission medications   Medication Sig Start Date End Date Taking? Authorizing Provider  ADVAIR DISKUS 250-50 MCG/DOSE AEPB Inhale 1 puff into the lungs 2 (two) times daily.  10/03/14  Yes Historical Provider, MD  aspirin 81 MG tablet Take 81 mg by mouth  daily.   Yes Historical Provider, MD  cetirizine (ZYRTEC) 10 MG tablet Take 10 mg by mouth daily.   Yes Historical Provider, MD  clonazePAM (KLONOPIN) 1 MG tablet Take 1 tablet (1 mg total) by mouth 3 (three) times daily. Take 1 tablet 3 times daily as needed 12/17/14  Yes Audery Amel, MD  CRESTOR 10 MG tablet Take 10 mg by mouth daily.  11/08/14  Yes Historical Provider, MD  FLUoxetine (PROZAC) 20 MG capsule Take 3 capsules (60 mg total) by mouth daily. 12/17/14  Yes Audery Amel, MD  fluticasone (FLONASE) 50 MCG/ACT nasal spray Place into both nostrils daily.   Yes Historical Provider, MD  hydrocortisone (ANUSOL-HC) 2.5 % rectal cream Place 1 application rectally 3 (three) times daily as needed for hemorrhoids or itching.   Yes Historical Provider, MD  levothyroxine (SYNTHROID, LEVOTHROID) 125 MCG tablet Take 125 mcg by mouth daily before breakfast.  10/03/14  Yes Historical Provider, MD  loperamide (IMODIUM) 2 MG capsule Take by mouth as needed for diarrhea or loose stools. prn   Yes Historical Provider, MD  metFORMIN (GLUCOPHAGE) 500 MG tablet Take 500 mg by mouth daily with breakfast.    Yes Historical Provider, MD  metoprolol tartrate (LOPRESSOR) 25 MG tablet Take 25 mg by mouth 2 (two) times daily.    Yes Historical Provider, MD  oxyCODONE-acetaminophen (PERCOCET) 7.5-325 MG tablet Limit 3-5 tabs po/day if tolerated. 06/13/15  Yes Ewing Schlein, MD  polyethylene glycol powder [facility]) powder as needed.  10/08/14  Yes Historical Provider, MD  tiotropium (SPIRIVA) 18 MCG inhalation capsule Place 18 mcg into inhaler and inhale daily.   Yes Historical Provider, MD  topiramate (TOPAMAX) 25 MG tablet Limit 1-2 tabs by mouth twice a day if tolerated 06/13/15  Yes Ewing Schlein, MD  zolpidem (AMBIEN) 10 MG tablet Take 1 tablet (10 mg total) by mouth at bedtime. 12/17/14  Yes Audery Amel, MD      VITAL SIGNS:  Blood pressure 139/75, pulse 80, temperature 98.3 F (36.8 C), temperature source  Oral, resp. rate 19, height  (1.676 m), weight 55.792 kg (123 lb), SpO2 94 %.  PHYSICAL EXAMINATION:  GENERAL:  70 y.o.-year-old patient lying in the bed with no acute distress.  EYES: Pupils equal, round, reactive to light and accommodation. No scleral icterus. Extraocular muscles intact.  HEENT: Head atraumatic, normocephalic. Oropharynx and nasopharynx clear.  NECK:  Supple, no jugular venous distention. No thyroid enlargement, no tenderness.  LUNGS: Normal breath sounds bilaterally, no wheezing, rales,rhonchi or crepitation. No use of accessory muscles of respiration.  CARDIOVASCULAR: S1, S2 normal. No murmurs, rubs, or gallops.  ABDOMEN: Soft, nontender, nondistended. Bowel sounds present. No organomegaly or mass.  EXTREMITIES: No pedal edema, cyanosis, or clubbing.  NEUROLOGIC: Cranial nerves II through XII are grossly intact. No focal deficits. PSYCHIATRIC: The patient is alert and oriented x 3.  SKIN: No obvious rash, lesion,  or ulcer.   LABORATORY PANEL:   CBC  Recent Labs Lab 06/13/15 1448  WBC 12.3*  HGB 14.2  HCT 43.9  PLT 418   ------------------------------------------------------------------------------------------------------------------  Chemistries   Recent Labs Lab 06/13/15 1448  NA 134*  K 2.9*  CL 91*  CO2 33*  GLUCOSE 185*  BUN 8  CREATININE 0.76  CALCIUM 9.3  MG 1.7   ------------------------------------------------------------------------------------------------------------------  Cardiac Enzymes  Recent Labs Lab 06/13/15 1448  TROPONINI <0.03   ------------------------------------------------------------------------------------------------------------------  RADIOLOGY:  Dg Chest 2 View  06/13/2015  CLINICAL DATA:  Six week history of cough. One week history of mid chest pain and nausea. Current history of COPD, hypertension, diabetes and CHF. Current smoker. EXAM: CHEST  2 VIEW COMPARISON:  12/27/2013 and earlier. FINDINGS:  Cardiac silhouette normal in size, unchanged. Thoracic aorta mildly atherosclerotic, unchanged. Hilar and mediastinal contours otherwise unremarkable. Hyperinflation and emphysematous changes throughout both lungs, unchanged. Mildly prominent bronchovascular markings and mild central peribronchial thickening, unchanged. Lungs otherwise clear. No localized airspace consolidation. No pleural effusions. No pneumothorax. Normal pulmonary vascularity. Generalized osseous demineralization. IMPRESSION: COPD/emphysema. Stable mild chronic bronchitis and/or asthma. No acute cardiopulmonary disease. Electronically Signed   By: Hulan Saas M.D.   On: 06/13/2015 15:14    EKG:  Normal sinus rhythm no ST elevation or depression  IMPRESSION AND PLAN:    70 year old female who presented with sinus tachycardia and was complaining of UTI and upper respiratory infection symptoms.  1. Sinus tachycardia: I suspect this is due to urinary tract infection. Her heart rate has improved with IV fluids. I will continue IV fluids and patient will be admitted with telemetry.  2. Acute cystitis with mild hematuria on urine analysis: Continue Rocephin and follow up on blood and urine culture.  3. Hypokalemia: Potassium will be repleted and magnesium will be checked. BMP for a.m.  4 Diabetes: Continue outpatient medications and sliding scale insulin with ADA diet.  5. Hypothyroid: Continue Synthroid and check TSH.  6. Acute bronchitis: Continue Rocephin.  Case management physical therapy consultation will be requested.    All the records are reviewed and case discussed with ED provider. Management plans discussed with the patient and she is in agreement. Discussed with daughter at bedside CODE STATUS: FULL  TOTAL TIME TAKING CARE OF THIS PATIENT: 50 minutes.    Kourtnee Lahey M.D on 06/13/2015 at 7:01 PM  Between 7am to 6pm - Pager - [phone] After 6pm go to [email or web address] - password [email or web address] [facility]  [place]  [facility]  Office  [phone]  CC: Primary care physician; Luna Fuse, MD

## 2015-06-13 NOTE — ED Provider Notes (Signed)
[facility] Emergency Department Provider Note  ____________________________________________  Time seen: Approximately 3:06 PM  I have reviewed the triage vital signs and the nursing notes.   HISTORY  Chief Complaint Tachycardia    HPI [name] is a 70 y.o. female with history of Graves, CHF, IBS, diabetes, hypertension, fibromyalgia who presents for evaluation of lightheadedness and tachycardia, gradual onset today, constant, no modifying factors, currently mild. The patient reports that she awoke earlier this morning in her usual state of health and went to see her pain specialist for a routine follow-up appointment. During that appointment, her heart rate was noted to be 130 bpm she was sent to the ER for further evaluation. She feels mildly lightheaded but denies any chest pain or difficulty breathing. She did not take her metoprolol this morning because she was running late for her appointment and forgot to take her medicines. She denies any fevers or chills, no vomiting or diarrhea, no dysuria. She has chronic diarrhea which is nonbloody and related to her irritable bowel syndrome.   Past Medical History  Diagnosis Date  . Sinus problem   . Fibromyalgia   . Chest pain   . CHF (congestive heart failure) (HCC)   . IBS (irritable bowel syndrome)   . Depression   . Anxiety   . Diabetes mellitus without complication (HCC)   . Spinal stenosis   . PTSD (post-traumatic stress disorder)   . Hypertension   . Graves disease     Patient Active Problem List   Diagnosis Date Noted  . Degenerative disc disease, lumbar 10/18/2014  . Sacroiliac joint dysfunction 10/18/2014  . Facet syndrome, lumbar 10/18/2014  . Cervical facet joint syndrome 10/18/2014  . Bilateral occipital neuralgia 10/18/2014  . Migraine 10/18/2014  . Neurosis, posttraumatic 10/08/2014  . Spinal stenosis   . PTSD (post-traumatic stress disorder)     Past Surgical History  Procedure  Laterality Date  . Tonsillectomy and adenoidectomy    . Toenail removal Bilateral     great toes  . Abdominal hysterectomy    . Rectal surgery    . Esophagogastroduodenoscopy (egd) with propofol N/A 01/10/2015    Procedure: ESOPHAGOGASTRODUODENOSCOPY (EGD) WITH PROPOFOL;  Surgeon: Wallace Cullens, MD;  Location: [facility] ENDOSCOPY;  Service: Gastroenterology;  Laterality: N/A;  . Savory dilation N/A 01/10/2015    Procedure: SAVORY DILATION;  Surgeon: Wallace Cullens, MD;  Location: [facility] ENDOSCOPY;  Service: Gastroenterology;  Laterality: N/A;    Current Outpatient Rx  Name  Route  Sig  Dispense  Refill  . ADVAIR DISKUS 250-50 MCG/DOSE AEPB   Inhalation   Inhale 1 puff into the lungs 2 (two) times daily.            Dispense as written.   Marland Kitchen aspirin 81 MG tablet   Oral   Take 81 mg by mouth daily.         . cetirizine (ZYRTEC) 10 MG tablet   Oral   Take 10 mg by mouth daily.         . clonazePAM (KLONOPIN) 1 MG tablet   Oral   Take 1 tablet (1 mg total) by mouth 3 (three) times daily. Take 1 tablet 3 times daily as needed   90 tablet   5   . CRESTOR 10 MG tablet   Oral   Take 10 mg by mouth daily.            Dispense as written.   Marland Kitchen FLUoxetine (PROZAC) 20  MG capsule   Oral   Take 3 capsules (60 mg total) by mouth daily.   90 capsule   6   . fluticasone (FLONASE) 50 MCG/ACT nasal spray   Each Nare   Place into both nostrils daily.         . hydrocortisone (ANUSOL-HC) 2.5 % rectal cream   Rectal   Place 1 application rectally 3 (three) times daily as needed for hemorrhoids or itching.         . levothyroxine (SYNTHROID, LEVOTHROID) 125 MCG tablet   Oral   Take 125 mcg by mouth daily before breakfast.          . loperamide (IMODIUM) 2 MG capsule   Oral   Take by mouth as needed for diarrhea or loose stools. prn         . metFORMIN (GLUCOPHAGE) 500 MG tablet   Oral   Take 500 mg by mouth daily with breakfast.          . metoprolol tartrate (LOPRESSOR) 25 MG  tablet   Oral   Take 25 mg by mouth 2 (two) times daily.          Marland Kitchen oxyCODONE-acetaminophen (PERCOCET) 7.5-325 MG tablet      Limit 3-5 tabs po/day if tolerated.   150 tablet   0   . polyethylene glycol powder (GLYCOLAX/MIRALAX) powder      as needed.          . tiotropium (SPIRIVA) 18 MCG inhalation capsule   Inhalation   Place 18 mcg into inhaler and inhale daily.         Marland Kitchen topiramate (TOPAMAX) 25 MG tablet      Limit 1-2 tabs by mouth twice a day if tolerated   110 tablet   0   . zolpidem (AMBIEN) 10 MG tablet   Oral   Take 1 tablet (10 mg total) by mouth at bedtime.   30 tablet   5     Allergies Celecoxib and Pregabalin  Family History  Problem Relation Age of Onset  . Alcohol abuse Mother   . Depression Mother   . Varicose Veins Mother   . Alcohol abuse Father   . Depression Father   . Alcohol abuse Sister   . Asthma Sister   . COPD Sister   . Depression Sister   . Heart disease Sister   . Hypertension Sister   . Depression Maternal Grandmother   . Diabetes Paternal Grandmother   . Stroke Paternal Grandmother     Social History Social History  Substance Use Topics  . Smoking status: Former Smoker -- 25 years    Types: Cigarettes    Quit date: 12/17/2011  . Smokeless tobacco: Never Used  . Alcohol Use: No    Review of Systems Constitutional: No fever/chills Eyes: No visual changes. ENT: No sore throat. Cardiovascular: Denies chest pain. Respiratory: Denies shortness of breath. Gastrointestinal: No abdominal pain.  No nausea, no vomiting.  No diarrhea.  No constipation. Genitourinary: Negative for dysuria. Musculoskeletal: Negative for back pain. Skin: Negative for rash. Neurological: Negative for headaches, focal weakness or numbness.  10-point ROS otherwise negative.  ____________________________________________   PHYSICAL EXAM:  VITAL SIGNS: ED Triage Vitals  Enc Vitals Group     BP 06/13/15 1441 118/63 mmHg     Pulse  Rate 06/13/15 1441 130     Resp 06/13/15 1441 18     Temp 06/13/15 1441 98.3 F (36.8 C)     Temp  Source 06/13/15 1441 Oral     SpO2 06/13/15 1441 93 %     Weight 06/13/15 1441 123 lb (55.792 kg)     Height 06/13/15 1441 5\' 6"  (1.676 m)     Head Cir --      Peak Flow --      Pain Score 06/13/15 1442 6     Pain Loc --      Pain Edu? --      Excl. in GC? --     Constitutional: Alert and oriented. Well appearing and in no acute distress. Talkative, smiling, pleasant. Eyes: Conjunctivae are normal. PERRL. EOMI. Head: Atraumatic. Nose: No congestion/rhinnorhea. Mouth/Throat: Mucous membranes are moist.  Oropharynx non-erythematous. Neck: No stridor.   Cardiovascular: tachycardic rate, regular rhythm. Grossly normal heart sounds.  Good peripheral circulation. Respiratory: Normal respiratory effort.  No retractions. Lungs CTAB. Gastrointestinal: Soft and nontender. No distention.  No CVA tenderness. Genitourinary: deferred Musculoskeletal: No lower extremity tenderness nor edema.  No joint effusions. Neurologic:  Normal speech and language. No gross focal neurologic deficits are appreciated.  Skin:  Skin is warm, dry and intact. No rash noted. Psychiatric: Mood and affect are normal. Speech and behavior are normal.  ____________________________________________   LABS (all labs ordered are listed, but only abnormal results are displayed)  Labs Reviewed  BASIC METABOLIC PANEL - Abnormal; Notable for the following:    Sodium 134 (*)    Potassium 2.9 (*)    Chloride 91 (*)    CO2 33 (*)    Glucose, Bld 185 (*)    All other components within normal limits  CBC - Abnormal; Notable for the following:    WBC 12.3 (*)    All other components within normal limits  TSH - Abnormal; Notable for the following:    TSH 0.118 (*)    All other components within normal limits  T4, FREE - Abnormal; Notable for the following:    Free T4 1.85 (*)    All other components within normal limits   URINALYSIS COMPLETEWITH MICROSCOPIC ([facility] ONLY) - Abnormal; Notable for the following:    Color, Urine AMBER (*)    APPearance CLOUDY (*)    Hgb urine dipstick 1+ (*)    Leukocytes, UA 3+ (*)    Bacteria, UA RARE (*)    Squamous Epithelial / LPF 6-30 (*)    All other components within normal limits  CULTURE, BLOOD (ROUTINE X 2)  CULTURE, BLOOD (ROUTINE X 2)  URINE CULTURE  TROPONIN I  MAGNESIUM   ____________________________________________  EKG  ED ECG REPORT I, Gayla Doss, the attending physician, personally viewed and interpreted this ECG.   Date: 06/13/2015  EKG Time: 14:37  Rate: 131  Rhythm: sinus tachycardia  Axis: normal  Intervals: QTC is prolonged at 569 ms.  ST&T Change: No acute ST elevation.  ____________________________________________  RADIOLOGY  CXR IMPRESSION: COPD/emphysema. Stable mild chronic bronchitis and/or asthma. No acute cardiopulmonary disease. ____________________________________________   PROCEDURES  Procedure(s) performed: None  Critical Care performed: Yes. Total clinical time spent 30 minutes.  ____________________________________________   INITIAL IMPRESSION / ASSESSMENT AND PLAN / ED COURSE  Pertinent labs & imaging results that were available during my care of the patient were reviewed by me and considered in my medical decision making (see chart for details).  AURORE REDINGER is a 70 y.o. female with history of raised disease, CHF, IBS, diabetes, hypertension, fibromyalgia who presents for evaluation of lightheadedness and tachycardia. On exam, she is well-appearing  and in no acute distress. She is tachycardic with heart rate of 120 however this appears to be sinus tachycardia on her EKG. The remainder of her vital signs are stable, she is afebrile. She has a benign physical examination. EKG shows QTC prolongation at 569 ms, will check magnesium level and give supplemental magnesium. We'll check basic labs as well as  thyroid studies, will give light IV fluids as well as her home dose of metoprolol since she did not take that this morning. Reassess for disposition.  ----------------------------------------- 6:31 PM on 06/13/2015 ----------------------------------------- HR imprpved to 108 bpm. Labs reviewed and are notable for leukocytosis with white blood cell count 12.3. BMP is notable for hypokalemia. We'll replete. Negative troponin. Urinalysis is concerning for urinary tract infection and the patient reports that her urine has had a foul smell today. She is meeting 2 out of 4 Sirs criteria and given urinary tract infection, concern is for possibly early sepsis secondary to urinary tract infection. IV fluids given however will not give full 30/kg bolus of the setting of known CHF. IV ceftriaxone ordered. Case discussed with hospitalist, Dr. Juliene Pina, for admission.  ____________________________________________   FINAL CLINICAL IMPRESSION(S) / ED DIAGNOSES  Final diagnoses:  Tachycardia  Lightheadedness  UTI (lower urinary tract infection)  Sepsis secondary to UTI (HCC)      Gayla Doss, MD 06/13/15 [id number]

## 2015-06-13 NOTE — ED Notes (Signed)
Patient transported to x-ray. ?

## 2015-06-13 NOTE — Progress Notes (Signed)
MEDICATION RELATED CONSULT NOTE - INITIAL   Pharmacy Consult for Electrolyte replacement  Indication: hypokalemia  Allergies  Allergen Reactions  . Celecoxib     Other reaction(s): Other (qualifier value) Patient is allergic to [facility] and found that it caused heart failure. CHF  . Pregabalin     Other reaction(s): Localized superficial swelling of skin    Patient Measurements: Height:  (167.6 cm) Weight: 123 lb (55.792 kg) IBW/kg (Calculated) : 59.3  Vital Signs: Temp: 98.3 F (36.8 C) (01/26 1441) Temp Source: Oral (01/26 1441) BP: 125/83 mmHg (01/26 1904) Pulse Rate: 103 (01/26 1904) Intake/Output from previous day:   Intake/Output from this shift:    Labs:  Recent Labs  06/13/15 1448  WBC 12.3*  HGB 14.2  HCT 43.9  PLT 418  CREATININE 0.76  MG 1.7   Estimated Creatinine Clearance: 57.6 mL/min (by C-G formula based on Cr of 0.76).   Microbiology: No results found for this or any previous visit (from the past 720 hour(s)).  Medical History: Past Medical History  Diagnosis Date  . Sinus problem   . Fibromyalgia   . Chest pain   . CHF (congestive heart failure) (HCC)   . IBS (irritable bowel syndrome)   . Depression   . Anxiety   . Diabetes mellitus without complication (HCC)   . Spinal stenosis   . PTSD (post-traumatic stress disorder)   . Hypertension   . Graves disease     Assessment: 70 yo female here with sinus tachycardia here with hypokalemia s/p KCl 40 mEq  PO x1 in the ED at 1607.  Mg 1.7 s/p 2 g IV mag in the ED  Goal of Therapy:  K 3.5-5.0 Mag ~2  Plan:  Will order KCl 10 mEq IV x4 runs (total 40 mEq) BMET at 0200 Mg level in AM (ordered by MD)  Pharmacy will continue to follow.    Marty Heck 06/13/2015,7:34 PM

## 2015-06-13 NOTE — ED Notes (Signed)
Lab called and notified of add on labs. States they will run them. 

## 2015-06-13 NOTE — Progress Notes (Signed)
Safety precautions to be maintained throughout the outpatient stay will include: orient to surroundings, keep bed in low position, maintain call bell within reach at all times, provide assistance with transfer out of bed and ambulation.  

## 2015-06-13 NOTE — Progress Notes (Signed)
Subjective:    Patient ID: [name], female    DOB: September 14, 1945, 70 y.o.   MRN: [id number]  HPI  The patient is a 70 year old female who returns to pain management for further evaluation and treatment of pain involving the neck headaches as well as the entire back upper and lower extremity regions. On today's visit the patient was with complaint of generalized fatigue. Patient admits to some URI symptoms as well. The patient was noted to be with tachycardia which greater resolved. The patient admitted to feeling tired with some weakness. We discussed patient's condition and decision was made to have patient undergo evaluation by primary care physician Dr. Ellsworth Lennox . Pending follow-up evaluation of patient we will consider modifications of treatment regimen. The patient denied any trauma change in events of daily living the call significant change in symptomatology. The patient who is willing to follow-up with her primary care physician as recommended and we we'll remain available to consider modifications of treatment pending follow-up evaluation. All agreed with suggested treatment plan. At the present time patient stated that the pain is fairly well controlled. We informed patient to call pain management prior to scheduled return appointment to discuss her condition as well as to update [place] regarding her evaluation by Dr. Ellsworth Lennox The patient was with understanding and agreement suggested treatment plan  Review of Systems     Objective:   Physical Exam  There was tenderness to palpation of paraspinal misreading the cervical region cervical facet region of mild to moderate degree. No new masses of the head and neck were noted. There was no excessive tends to palpation of the temporal regions and no bounding pulsations of the temporal region. Palpation of the splenius capitis and occipitalis musculature regions were attends to palpation of mild to moderate degree. Palpation of the thoracic facet  thoracic paraspinal must reason was attends to palpation of mild to moderate degree. Palpation of the acromioclavicular and glenohumeral joint regions reproduces minimal discomfort. There was tenderness to palpation of the of the thoracic facet thoracic paraspinal musculature region of moderate degree without crepitus of the thoracic region noted. Palpation over the lumbar paraspinal must reason lumbar facet region was with moderate tenderness to palpation with lateral bending rotation extension and palpation of the lumbar facets reproducing moderate discomfort. There was tenderness of the PSIS and PII S region a moderate degree with moderate tenderness of the greater trochanteric region and iliotibial band region. No sensory deficit or dermatomal distribution detected. There was negative clonus negative Homans. DTRs were difficult to this patient had difficulty relaxing. No definite sensory deficit or dermatomal distribution detected. EHL strength appeared to be slightly decreased. Knees were attends to palpation with crepitus of the knees with negative anterior and posterior drawer signs. Straight leg raising was tolerated to 30 without increased pain with dorsiflexion noted. There was negative clonus negative Homans. Abdomen nontender with no costovertebral tenderness noted.      Assessment & Plan:    Sacroiliac joint dysfunction  Degenerative disc disease lumbar spine L4-5 L3-4 degenerative changes predominantly  Degenerative disc disease of the cervical spine  Cervical facet syndrome  Bilateral occipital neuralgia  Fibromyalgia      PLAN   Continue present medication Topamax and oxycodone acetaminophen  F/U PCP  [facility] for evaliation of  BP , tachycardia, URI symptoms, malaise, orthostatic changes, and general medical condition today or by tomorrow 06/14/2015   F/U surgical evaluation. May consider pending follow-up evaluations. We will avoid at this time  Consider cardiac  evaluation as discussed and pending evaluation by Dr. Ellsworth Lennox   F/U neurological evaluation. May consider pending follow-up evaluations  May consider radiofrequency rhizolysis or intraspinal procedures pending response to present treatment and F/U evaluation . We will avoid considering these procedures at this time  Patient to call Pain Management Center should patient have concerns prior to scheduled return appointment

## 2015-06-13 NOTE — Patient Instructions (Addendum)
PLAN   Continue present medication Topamax and oxycodone acetaminophen  F/U PCP  [facility] for evaliation of  BP , tachycardia, URI symptoms, malaise, orthostatic changes, and general medical condition today or by tomorrow 06/14/2015   F/U surgical evaluation. May consider pending follow-up evaluations. We will avoid at this time  Consider cardiac evaluation as discussed and pending evaluation by Dr. Ellsworth Lennox   F/U neurological evaluation. May consider pending follow-up evaluations  May consider radiofrequency rhizolysis or intraspinal procedures pending response to present treatment and F/U evaluation . We will avoid considering these procedures at this time  Patient to call Pain Management Center should patient have concerns prior to scheduled return appointment

## 2015-06-13 NOTE — ED Notes (Addendum)
Pharmacy called and notified of need of potassium chloride. States they will send it. 

## 2015-06-14 DIAGNOSIS — R000 Tachycardia, unspecified: Secondary | ICD-10-CM | POA: Diagnosis not present

## 2015-06-14 DIAGNOSIS — N3001 Acute cystitis with hematuria: Secondary | ICD-10-CM | POA: Diagnosis not present

## 2015-06-14 LAB — BASIC METABOLIC PANEL
Anion gap: 5 (ref 5–15)
BUN: 7 mg/dL (ref 6–20)
CALCIUM: 8.3 mg/dL — AB (ref 8.9–10.3)
CHLORIDE: 96 mmol/L — AB (ref 101–111)
CO2: 31 mmol/L (ref 22–32)
CREATININE: 0.66 mg/dL (ref 0.44–1.00)
GFR calc Af Amer: >60 mL/min (ref >60)
GFR calc non Af Amer: >60 mL/min (ref >60)
Glucose, Bld: 112 mg/dL — ABNORMAL HIGH (ref 65–99)
Potassium: 3.7 mmol/L (ref 3.5–5.1)
SODIUM: 132 mmol/L — AB (ref 135–145)

## 2015-06-14 LAB — GLUCOSE, CAPILLARY
Glucose-Capillary: 113 mg/dL — ABNORMAL HIGH (ref 65–99)
Glucose-Capillary: 131 mg/dL — ABNORMAL HIGH (ref 65–99)
Glucose-Capillary: 139 mg/dL — ABNORMAL HIGH (ref 65–99)

## 2015-06-14 LAB — TSH: TSH: 0.125 u[IU]/mL — ABNORMAL LOW (ref 0.350–4.500)

## 2015-06-14 LAB — MAGNESIUM: Magnesium: 1.9 mg/dL (ref 1.7–2.4)

## 2015-06-14 MED ORDER — LEVOTHYROXINE SODIUM 100 MCG PO TABS: 100.0000 ug | ORAL_TABLET | Freq: Every day | ORAL | Status: DC

## 2015-06-14 MED ORDER — CEFUROXIME AXETIL 500 MG PO TABS: 500.0000 mg | ORAL_TABLET | Freq: Two times a day (BID) | ORAL | Status: DC

## 2015-06-14 NOTE — Progress Notes (Signed)
Several attempts to visit patient but patient resting in sleep. Follow up will take place.

## 2015-06-14 NOTE — Progress Notes (Signed)
   06/14/15 1030  Clinical Encounter Type  Visited With Patient  Visit Type Initial  Referral From Chaplain  Consult/Referral To Chaplain  Spiritual Encounters  Spiritual Needs Prayer  Stress Factors  Patient Stress Factors Health changes  Met w/patient. Provided pastoral care & prayer. Chap. Seana Underwood G. [place]

## 2015-06-14 NOTE — Evaluation (Signed)
Physical Therapy Evaluation Patient Details Name: [name] MRN: [id number] DOB: 03/10/1946 Today's Date: 06/14/2015   History of Present Illness  [name] is a 70 y.o. female with a known history of diabetes and irritable bowel syndrome who presented to her primary care physician's office for follow-up and was found to have tachycardia and UTI symptoms. Due to her tachycardia she was asked to come to the emergency room for further evaluation. Emergency room her heart rate was up to 130s. She is found to have a urinary tract infection. She does state that she has dysuria, frequency and urgency. Pt denies falls in the last 12 months. AOx4 at time of evaluation.  Clinical Impression  Pt demonstrates good speed and stability with transfers and ambulation. No evidence for instability throughout session with the exception of high level balance deficits in tandem and single leg stance. Positive Rhomberg test but pt denies LE numbness/tingling. Pt could benefit from OP PT to work on her balance and decrease her risk for falls however she refuses at this time. She can obtain order from PCP if she desires balance therapy at some time in the future. Pt instructed in some limited balance exercises during evaluation that she can perform at home. Pt agrees to receive PT services while admitted to [facility]. She will be safe to discharge home when medically appropriate. Pt will benefit from skilled PT services to address deficits in strength, balance, and mobility in order to return to full function at home.     Follow Up Recommendations Outpatient PT (For balance, pt refuses)    Equipment Recommendations  None recommended by PT    Recommendations for Other Services       Precautions / Restrictions Precautions Precautions: Fall Restrictions Weight Bearing Restrictions: No      Mobility  Bed Mobility Overal bed mobility: Independent             General bed mobility comments: Good  speed/sequencing  Transfers Overall transfer level: Independent Equipment used: None             General transfer comment: Good LE strength and stability noted. No abnormalities noted. 5TSTS: 8.8 seconds  Ambulation/Gait Ambulation/Gait assistance: Supervision Ambulation Distance (Feet): 80 Feet Assistive device: None Gait Pattern/deviations: WFL(Within Functional Limits)   Gait velocity interpretation: at or above normal speed for age/gender General Gait Details: Good speed and step length noted. Able to perform horizontal and vertical head turns without lateral gait deviations. 180 degree turns in <3 seconds. No evidence for instability noted during ambulation  Stairs            Wheelchair Mobility    Modified Rankin (Stroke Patients Only)       Balance Overall balance assessment: Needs assistance Sitting-balance support: No upper extremity supported Sitting balance-Leahy Scale: Normal Sitting balance - Comments: Able to tolerate challenge by external perturbations   Standing balance support: No upper extremity supported Standing balance-Leahy Scale: Good Standing balance comment: Positive Rhomberg within 5 seconds. Single leg balance 3 seconds/leg. Cannot perform tandem stance Single Leg Stance - Right Leg: 3 Single Leg Stance - Left Leg: 3     Rhomberg - Eyes Opened: 30 Rhomberg - Eyes Closed: 5                 Pertinent Vitals/Pain Pain Assessment: 0-10 Pain Score: 7  Pain Location: bilateral shoudler pain, chronic Pain Intervention(s): Monitored during session    Home Living Family/patient expects to be discharged to:: Private residence Living  Arrangements: Alone Available Help at Discharge: Family Type of Home: House Home Access: Stairs to enter Entrance Stairs-Rails: Can reach both Entrance Stairs-Number of Steps: 3 Home Layout: One level Home Equipment: Shower seat (no rolling walker or cane)      Prior Function Level of  Independence: Independent         Comments: Independent with ADLs, assist with IADLs     Hand Dominance   Dominant Hand: Right    Extremity/Trunk Assessment   Upper Extremity Assessment: Overall WFL for tasks assessed           Lower Extremity Assessment: Overall WFL for tasks assessed         Communication   Communication: No difficulties  Cognition Arousal/Alertness: Awake/alert Behavior During Therapy: WFL for tasks assessed/performed Overall Cognitive Status: Within Functional Limits for tasks assessed                      General Comments      Exercises        Assessment/Plan    PT Assessment Patient needs continued PT services  PT Diagnosis Difficulty walking   PT Problem List Decreased balance;Decreased mobility;Decreased safety awareness  PT Treatment Interventions Gait training;Stair training;Therapeutic exercise;Therapeutic activities;Balance training;Neuromuscular re-education   PT Goals (Current goals can be found in the Care Plan section) Acute Rehab PT Goals Patient Stated Goal: I want to stay strong while I'm here PT Goal Formulation: With patient Time For Goal Achievement: 06/28/15 Potential to Achieve Goals: Good    Frequency Min 2X/week   Barriers to discharge Decreased caregiver support Lives alone    Co-evaluation               End of Session Equipment Utilized During Treatment: Gait belt Activity Tolerance: Patient tolerated treatment well Patient left: in bed;with call bell/phone within reach;with bed alarm set Nurse Communication: Other (comment) (Cleared by RN prior to PT session)    Functional Assessment Tool Used: clinical judgement, SLS, 5TSTS Functional Limitation: Mobility: Walking and moving around Mobility: Walking and Moving Around Current Status (Z6109): At least 1 percent but less than 20 percent impaired, limited or restricted Mobility: Walking and Moving Around Goal Status [phone]): At least 1  percent but less than 20 percent impaired, limited or restricted    Time: 1022-1039 PT Time Calculation (min) (ACUTE ONLY): 17 min   Charges:   PT Evaluation $PT Eval Low Complexity: 1 Procedure     PT G Codes:   PT G-Codes **NOT FOR INPATIENT CLASS** Functional Assessment Tool Used: clinical judgement, SLS, 5TSTS Functional Limitation: Mobility: Walking and moving around Mobility: Walking and Moving Around Current Status (U9811): At least 1 percent but less than 20 percent impaired, limited or restricted Mobility: Walking and Moving Around Goal Status [phone]): At least 1 percent but less than 20 percent impaired, limited or restricted   Lynnea Maizes PT, DPT   Huprich,Jason 06/14/2015, 10:40 AM

## 2015-06-14 NOTE — Care Management Obs Status (Signed)
MEDICARE OBSERVATION STATUS NOTIFICATION   Patient Details  Name: [name] MRN: [id number] Date of Birth: 05/11/1946   Medicare Observation Status Notification Given:  Yes    Gwenette Greet, RN 06/14/2015, 9:46 AM

## 2015-06-14 NOTE — Care Management (Addendum)
Admitted to [facility] with the diagnosis of unstable angina. Lives alone. Daughter is Marcelino Duster and son-in-law is Bridget Hartshorn [phone]). Last seen Dr. Kinnie Feil about a month ago. Seen Dr. Metta Clines for pain management yesterday. Home Health about a year ago. Doesn't remember name of agency. No skilled facility. No home oxygen. Uses no aids for ambulation. Takes care of all basic activities of daily living herself, doesn't drive. Friends/family helps with errands. No falls. Usually a good appetite.  Friends/family will transport. Gwenette Greet RN MSN CCM Care Management [phone]

## 2015-06-14 NOTE — Care Management (Signed)
Spoke at lengths with daughter, Elon Jester. States that she and her husband have left their home and moved to another home. Mr. Poss is alone in their home. "States the house is a mess. She doesn't get up much during the day." Discussed assisted living facilities. States mother probably wouldn't go to assisted living.  Discussed that her mother has not been deemed incompentent, she can make her decisions.  Recommended talking to her mother's psychiatrist. (With mother's permission). Physical therapy evaluation completed. Recommending outpatient therapy. Will not able to sent Social Worker into the home. Will encourage daughter to call the Department of Social Services, if needed. Gwenette Greet RN MSN CCM Care Management [phone]

## 2015-06-14 NOTE — Discharge Summary (Signed)
[facility] [place] [facility]   PATIENT NAME: [name]    MR#:  [id number]  DATE OF BIRTH:  May 02, 1946  DATE OF ADMISSION:  06/13/2015 ADMITTING PHYSICIAN: Adrian Saran, MD  DATE OF DISCHARGE: 06/14/2015  PRIMARY CARE PHYSICIAN: Luna Fuse, MD    ADMISSION DIAGNOSIS:  Spinal stenosis of thoracic region [M48.04] Lightheadedness [R42] Tachycardia [R00.0] UTI (lower urinary tract infection) [N39.0] Sacroiliac joint dysfunction [M53.3] Spinal stenosis of lumbar region [M48.06] Cervical facet joint syndrome [M53.82] Migraine with aura and without status migrainosus, not intractable [G43.109] Facet syndrome, lumbar [M54.5] Degenerative disc disease, lumbar [M51.36] Sepsis secondary to UTI (HCC) [A41.9, N39.0] Bilateral occipital neuralgia [M54.81]  DISCHARGE DIAGNOSIS:  Active Problems:   Unstable angina (HCC)   UTI (lower urinary tract infection)   SECONDARY DIAGNOSIS:   Past Medical History  Diagnosis Date  . Sinus problem   . Fibromyalgia   . Chest pain   . CHF (congestive heart failure) (HCC)   . IBS (irritable bowel syndrome)   . Depression   . Anxiety   . Diabetes mellitus without complication (HCC)   . Spinal stenosis   . PTSD (post-traumatic stress disorder)   . Hypertension   . Graves disease     HOSPITAL COURSE:  70 year old female who presented with sinus tachycardia and was complaining of UTI and upper respiratory infection symptoms.  1. Sinus tachycardia: I suspect this is due to urinary tract infection. Her heart rate has improved with IV fluids. I will continue IV fluids and patient will be admitted with telemetry.  2. Acute cystitis with mild hematuria on urine analysis: Continue Rocephin and follow up on blood and urine culture.  3. Hypokalemia: Potassium will be repleted and magnesium will be checked. BMP for a.m.  4 Diabetes: Continue outpatient medications and sliding scale insulin with ADA  diet.  5. Hypothyroid: Continue Synthroid and check TSH.  6. Acute bronchitis: Continue Rocephin.  Case management physical therapy consultation will be requested   DISCHARGE CONDITIONS AND DIET:   Patient is stable for discharge on a heart healthy diet     CONSULTS OBTAINED:     DRUG ALLERGIES:   Allergies  Allergen Reactions  . Celecoxib     Other reaction(s): Other (qualifier value) Patient is allergic to [facility] and found that it caused heart failure. CHF  . Pregabalin     Other reaction(s): Localized superficial swelling of skin    DISCHARGE MEDICATIONS:   Current Discharge Medication List    START taking these medications   Details  cefUROXime (CEFTIN) 500 MG tablet Take 1 tablet (500 mg total) by mouth 2 (two) times daily with a meal. Qty: 12 tablet, Refills: 0      CONTINUE these medications which have CHANGED   Details  levothyroxine (SYNTHROID, LEVOTHROID) 100 MCG tablet Take 1 tablet (100 mcg total) by mouth daily before breakfast. Qty: 30 tablet, Refills: 0      CONTINUE these medications which have NOT CHANGED   Details  ADVAIR DISKUS 250-50 MCG/DOSE AEPB Inhale 1 puff into the lungs 2 (two) times daily.     aspirin 81 MG tablet Take 81 mg by mouth daily.    cetirizine (ZYRTEC) 10 MG tablet Take 10 mg by mouth daily.    clonazePAM (KLONOPIN) 1 MG tablet Take 1 tablet (1 mg total) by mouth 3 (three) times daily. Take 1 tablet 3 times daily as needed Qty: 90 tablet, Refills: 5   Associated Diagnoses: Major depressive disorder,  severe (HCC)    CRESTOR 10 MG tablet Take 10 mg by mouth daily.     FLUoxetine (PROZAC) 20 MG capsule Take 3 capsules (60 mg total) by mouth daily. Qty: 90 capsule, Refills: 6    fluticasone (FLONASE) 50 MCG/ACT nasal spray Place into both nostrils daily.    hydrocortisone (ANUSOL-HC) 2.5 % rectal cream Place 1 application rectally 3 (three) times daily as needed for hemorrhoids or itching.    loperamide (IMODIUM) 2  MG capsule Take by mouth as needed for diarrhea or loose stools. prn    metFORMIN (GLUCOPHAGE) 500 MG tablet Take 500 mg by mouth daily with breakfast.     metoprolol tartrate (LOPRESSOR) 25 MG tablet Take 25 mg by mouth 2 (two) times daily.     polyethylene glycol powder (GLYCOLAX/MIRALAX) powder as needed.     tiotropium (SPIRIVA) 18 MCG inhalation capsule Place 18 mcg into inhaler and inhale daily.    topiramate (TOPAMAX) 25 MG tablet Limit 1-2 tabs by mouth twice a day if tolerated Qty: 110 tablet, Refills: 0    zolpidem (AMBIEN) 10 MG tablet Take 1 tablet (10 mg total) by mouth at bedtime. Qty: 30 tablet, Refills: 5   Associated Diagnoses: Major depressive disorder, severe (HCC)      STOP taking these medications     oxyCODONE-acetaminophen (PERCOCET) 7.5-325 MG tablet               Today   CHIEF COMPLAINT:  Patient is doing well this point. Patient denies palpitations or shortness of breath   VITAL SIGNS:  Blood pressure 133/60, pulse 73, temperature 98.3 F (36.8 C), temperature source Oral, resp. rate 17, height  (1.676 m), weight 60.419 kg (133 lb 3.2 oz), SpO2 94 %.   REVIEW OF SYSTEMS:  Review of Systems  Constitutional: Negative for fever, chills and malaise/fatigue.  HENT: Negative for sore throat.   Eyes: Negative for blurred vision.  Respiratory: Negative for cough, hemoptysis, shortness of breath and wheezing.   Cardiovascular: Negative for chest pain, palpitations and leg swelling.  Gastrointestinal: Negative for nausea, vomiting, abdominal pain, diarrhea and blood in stool.  Genitourinary: Negative for dysuria.  Musculoskeletal: Negative for back pain.  Neurological: Negative for dizziness, tremors and headaches.  Endo/Heme/Allergies: Does not bruise/bleed easily.     PHYSICAL EXAMINATION:  GENERAL:  70 y.o.-year-old patient lying in the bed with no acute distress.  NECK:  Supple, no jugular venous distention. No thyroid enlargement,  no tenderness.  LUNGS: Normal breath sounds bilaterally, no wheezing, rales,rhonchi  No use of accessory muscles of respiration.  CARDIOVASCULAR: S1, S2 normal. No murmurs, rubs, or gallops.  ABDOMEN: Soft, non-tender, non-distended. Bowel sounds present. No organomegaly or mass.  EXTREMITIES: No pedal edema, cyanosis, or clubbing.  PSYCHIATRIC: The patient is alert and oriented x 3.  SKIN: No obvious rash, lesion, or ulcer.   DATA REVIEW:   CBC  Recent Labs Lab 06/13/15 1448  WBC 12.3*  HGB 14.2  HCT 43.9  PLT 418    Chemistries   Recent Labs Lab 06/14/15 0500  NA 132*  K 3.7  CL 96*  CO2 31  GLUCOSE 112*  BUN 7  CREATININE 0.66  CALCIUM 8.3*  MG 1.9    Cardiac Enzymes  Recent Labs Lab 06/13/15 1448  TROPONINI <0.03    Microbiology Results  @  RADIOLOGY:  Dg Chest 2 View  06/13/2015  CLINICAL DATA:  Six week history of cough. One week history of mid chest pain and  nausea. Current history of COPD, hypertension, diabetes and CHF. Current smoker. EXAM: CHEST  2 VIEW COMPARISON:  12/27/2013 and earlier. FINDINGS: Cardiac silhouette normal in size, unchanged. Thoracic aorta mildly atherosclerotic, unchanged. Hilar and mediastinal contours otherwise unremarkable. Hyperinflation and emphysematous changes throughout both lungs, unchanged. Mildly prominent bronchovascular markings and mild central peribronchial thickening, unchanged. Lungs otherwise clear. No localized airspace consolidation. No pleural effusions. No pneumothorax. Normal pulmonary vascularity. Generalized osseous demineralization. IMPRESSION: COPD/emphysema. Stable mild chronic bronchitis and/or asthma. No acute cardiopulmonary disease. Electronically Signed   By: Hulan Saas M.D.   On: 06/13/2015 15:14      Management plans discussed with the patient and she is in agreement. Stable for discharge home  Patient should follow up with PCP 1 week  CODE STATUS:     Code Status Orders         Start     Ordered   06/13/15 2129  Full code   Continuous     06/13/15 2128    Code Status History    Date Active Date Inactive Code Status Order ID Comments User Context   This patient has a current code status but no historical code status.    Advance Directive Documentation        Most Recent Value   Type of Advance Directive  Healthcare Power of Attorney, Living will   Pre-existing out of facility DNR order (yellow form or pink MOST form)     "MOST" Form in Place?        TOTAL TIME TAKING CARE OF THIS PATIENT: 35 minutes.    Note: This dictation was prepared with Dragon dictation along with smaller phrase technology. Any transcriptional errors that result from this process are unintentional.  Diondre Pulis M.D on 06/14/2015 at 11:23 AM  Between 7am to 6pm - Pager - [phone] After 6pm go to [email or web address] - password [email or web address] [facility]  [facility]  Office  [phone]  CC: Primary care physician; Luna Fuse, MD

## 2015-06-14 NOTE — Progress Notes (Signed)
Pt unable to tolerate IV Potassium running at ordered time. Called lab to re-schedule BMP scheduled for 0200AM with rest of labs at 0500AM since potassium is still running.

## 2015-06-14 NOTE — Progress Notes (Signed)
MEDICATION RELATED CONSULT NOTE - INITIAL   Pharmacy Consult for Electrolyte replacement  Indication: hypokalemia  Allergies  Allergen Reactions  . Celecoxib     Other reaction(s): Other (qualifier value) Patient is allergic to [facility] and found that it caused heart failure. CHF  . Pregabalin     Other reaction(s): Localized superficial swelling of skin    Patient Measurements: Height:  (167.6 cm) Weight: 133 lb 3.2 oz (60.419 kg) IBW/kg (Calculated) : 59.3  Vital Signs: Temp: 97.8 F (36.6 C) (01/27 0500) Temp Source: Oral (01/27 0500) BP: 101/53 mmHg (01/27 0500) Pulse Rate: 64 (01/27 0500) Intake/Output from previous day: 01/26 0701 - 01/27 0700 In: -  Out: 251 [Urine:251] Intake/Output from this shift: Total I/O In: -  Out: 251 [Urine:251]  Labs:  Recent Labs  06/13/15 1448 06/14/15 0500  WBC 12.3*  --   HGB 14.2  --   HCT 43.9  --   PLT 418  --   CREATININE 0.76 0.66  MG 1.7 1.9   Estimated Creatinine Clearance: 61.3 mL/min (by C-G formula based on Cr of 0.66).   Microbiology: No results found for this or any previous visit (from the past 720 hour(s)).  Medical History: Past Medical History  Diagnosis Date  . Sinus problem   . Fibromyalgia   . Chest pain   . CHF (congestive heart failure) (HCC)   . IBS (irritable bowel syndrome)   . Depression   . Anxiety   . Diabetes mellitus without complication (HCC)   . Spinal stenosis   . PTSD (post-traumatic stress disorder)   . Hypertension   . Graves disease     Assessment: 70 yo female here with sinus tachycardia here with hypokalemia s/p KCl 40 mEq  PO x1 in the ED at 1607.  Mg 1.7 s/p 2 g IV mag in the ED  Goal of Therapy:  K 3.5-5.0 Mag ~2  Plan:  Will order KCl 10 mEq IV x4 runs (total 40 mEq) BMET at 0200 Mg level in AM (ordered by MD)  Pharmacy will continue to follow.   1/27 AM electrolyte panel. No replacement indicated. F/u BMP in AM.   Laparis Durrett  S 06/14/2015,6:27 AM

## 2015-06-14 NOTE — Progress Notes (Signed)
MD making rounds. Discharge orders received. Appointment Scheduled. Telemetry Removed. IV removed. Prescriptions E-Prescribed to Pharmacy. Discharge paperwork provided, explained, signed and witnessed. Education handouts provided to patient. No unanswered questions. Escorted via wheelchair by nursing staff. All belongings sent with patient and family.    

## 2015-06-18 LAB — CULTURE, BLOOD (ROUTINE X 2)
Culture: NO GROWTH
Culture: NO GROWTH

## 2015-06-20 ENCOUNTER — Encounter: Payer: Self-pay | Admitting: Psychiatry

## 2015-06-20 ENCOUNTER — Ambulatory Visit (INDEPENDENT_AMBULATORY_CARE_PROVIDER_SITE_OTHER): Payer: Medicare Other | Admitting: Psychiatry

## 2015-06-20 VITALS — BP 122/70 | HR 77 | Temp 97.0°F | Ht 66.0 in | Wt 134.4 lb

## 2015-06-20 DIAGNOSIS — F322 Major depressive disorder, single episode, severe without psychotic features: Secondary | ICD-10-CM

## 2015-06-20 DIAGNOSIS — F3181 Bipolar II disorder: Secondary | ICD-10-CM | POA: Diagnosis not present

## 2015-06-20 MED ORDER — FLUOXETINE HCL 20 MG PO CAPS: 60.0000 mg | ORAL_CAPSULE | Freq: Every day | ORAL | Status: DC

## 2015-06-20 MED ORDER — CLONAZEPAM 1 MG PO TABS: 1.0000 mg | ORAL_TABLET | Freq: Three times a day (TID) | ORAL | Status: DC

## 2015-06-20 NOTE — Progress Notes (Signed)
[facility] MD Progress Note  06/20/2015 7:46 PM [name]  MRN:  [id number] Subjective:  Follow-up for this 70 year old woman with a history of bipolar 2. Patient comes in today along with her daughter. The 2 of them want to discuss her future plans. The patient has no new complaints. Says her mood is been good. Denies depression denies major mood swings. Daughter is concerned about wanting the patient to probably move into an assisted living facility. Patient states she is agreeable to this. I am willing to sign the [place] to form. No change to her medicine. We spent some time going over the risks of continued benzodiazepines along with narcotics and detailing the crucial importance of being very cautious with this. Principal Problem: @ Diagnosis:   Patient Active Problem List   Diagnosis Date Noted  . Unstable angina (HCC) [I20.0] 06/13/2015  . UTI (lower urinary tract infection) [N39.0] 06/13/2015  . Degenerative disc disease, lumbar [M51.36] 10/18/2014  . Sacroiliac joint dysfunction [M53.3] 10/18/2014  . Facet syndrome, lumbar [M54.5] 10/18/2014  . Cervical facet joint syndrome [M53.82] 10/18/2014  . Bilateral occipital neuralgia [M54.81] 10/18/2014  . Migraine [G43.909] 10/18/2014  . Neurosis, posttraumatic [F43.10] 10/08/2014  . Spinal stenosis [M48.00]   . PTSD (post-traumatic stress disorder) [F43.10]    Total Time spent with patient: 20 minutes  Past Psychiatric History: Patient has a history of chronic mood instability and moodiness with good control on medication over the last few years.  Past Medical History:  Past Medical History  Diagnosis Date  . Sinus problem   . Fibromyalgia   . Chest pain   . CHF (congestive heart failure) (HCC)   . IBS (irritable bowel syndrome)   . Depression   . Anxiety   . Diabetes mellitus without complication (HCC)   . Spinal stenosis   . PTSD (post-traumatic stress disorder)   . Hypertension   . Graves disease     Past Surgical History   Procedure Laterality Date  . Tonsillectomy and adenoidectomy    . Toenail removal Bilateral     great toes  . Abdominal hysterectomy    . Rectal surgery    . Esophagogastroduodenoscopy (egd) with propofol N/A 01/10/2015    Procedure: ESOPHAGOGASTRODUODENOSCOPY (EGD) WITH PROPOFOL;  Surgeon: Wallace Cullens, MD;  Location: [facility] ENDOSCOPY;  Service: Gastroenterology;  Laterality: N/A;  . Savory dilation N/A 01/10/2015    Procedure: SAVORY DILATION;  Surgeon: Wallace Cullens, MD;  Location: [facility] ENDOSCOPY;  Service: Gastroenterology;  Laterality: N/A;   Family History:  Family History  Problem Relation Age of Onset  . Alcohol abuse Mother   . Depression Mother   . Varicose Veins Mother   . Alcohol abuse Father   . Depression Father   . Alcohol abuse Sister   . Asthma Sister   . COPD Sister   . Depression Sister   . Heart disease Sister   . Hypertension Sister   . Depression Maternal Grandmother   . Diabetes Paternal Grandmother   . Stroke Paternal Grandmother    Family Psychiatric  History: Family history positive for depression and anxiety Social History:  History  Alcohol Use No     History  Drug Use No    Social History   Social History  . Marital Status: Widowed    Spouse Name: N/A  . Number of Children: N/A  . Years of Education: N/A   Social History Main Topics  . Smoking status: Former Smoker -- 25 years  Types: Cigarettes    Quit date: 12/17/2011  . Smokeless tobacco: Never Used  . Alcohol Use: No  . Drug Use: No  . Sexual Activity: No   Other Topics Concern  . None   Social History Narrative   Additional Social History:                         Sleep: Continues to complain of poor sleep but did not find trazodone helpful and prefers to simply go without any medicine. Ambien also counterproductive.  Appetite:  Good  Current Medications: Current Outpatient Prescriptions  Medication Sig Dispense Refill  . ADVAIR DISKUS 250-50 MCG/DOSE AEPB Inhale  1 puff into the lungs 2 (two) times daily.     Marland Kitchen aspirin 81 MG tablet Take 81 mg by mouth daily.    . cefUROXime (CEFTIN) 500 MG tablet Take 1 tablet (500 mg total) by mouth 2 (two) times daily with a meal. 12 tablet 0  . cetirizine (ZYRTEC) 10 MG tablet Take 10 mg by mouth daily.    . clonazePAM (KLONOPIN) 1 MG tablet Take 1 tablet (1 mg total) by mouth 3 (three) times daily. Take 1 tablet 3 times daily as needed 90 tablet 5  . CRESTOR 10 MG tablet Take 10 mg by mouth daily.     . cyclobenzaprine (FLEXERIL) 10 MG tablet     . FLUoxetine (PROZAC) 20 MG capsule Take 3 capsules (60 mg total) by mouth daily. 90 capsule 6  . fluticasone (FLONASE) 50 MCG/ACT nasal spray Place into both nostrils daily.    . hydrocortisone (ANUSOL-HC) 2.5 % rectal cream Place 1 application rectally 3 (three) times daily as needed for hemorrhoids or itching.    . hyoscyamine (LEVSIN SL) 0.125 MG SL tablet TAKE ONE TABLET BY MOUTH EVERY 4 HOURS AS NEEDED FOR CRAMPING    . levothyroxine (SYNTHROID, LEVOTHROID) 125 MCG tablet     . loperamide (IMODIUM) 2 MG capsule Take by mouth as needed for diarrhea or loose stools. prn    . metFORMIN (GLUCOPHAGE) 500 MG tablet Take 500 mg by mouth daily with breakfast.     . metoprolol tartrate (LOPRESSOR) 25 MG tablet Take 25 mg by mouth 2 (two) times daily.     Marland Kitchen oxyCODONE-acetaminophen (PERCOCET) 7.5-325 MG tablet     . pantoprazole (PROTONIX) 40 MG tablet Take by mouth.    . polyethylene glycol powder (GLYCOLAX/MIRALAX) powder as needed.     . tiotropium (SPIRIVA) 18 MCG inhalation capsule Place 18 mcg into inhaler and inhale daily.    Marland Kitchen topiramate (TOPAMAX) 25 MG tablet Limit 1-2 tabs by mouth twice a day if tolerated 110 tablet 0   Current Facility-Administered Medications  Medication Dose Route Frequency Provider Last Rate Last Dose  . lidocaine (PF) (XYLOCAINE) 1 % injection 10 mL  10 mL Subcutaneous Once Ewing Schlein, MD        Lab Results: No results found for this or  any previous visit (from the past 48 hour(s)).  Physical Findings: AIMS:  , ,  ,  ,    CIWA:    COWS:     Musculoskeletal: Strength & Muscle Tone: within normal limits Gait & Station: normal Patient leans: N/A  Psychiatric Specialty Exam: ROS  Blood pressure 122/70, pulse 77, temperature 97 F (36.1 C), temperature source Tympanic, height  (1.676 m), weight 134 lb 6.4 oz (60.963 kg), SpO2 96 %.Body mass index is 21.7 kg/(m^2).  General Appearance: Well  Groomed  Patent attorney::  Good  Speech:  Clear and Coherent  Volume:  Normal  Mood:  Euthymic  Affect:  Congruent  Thought Process:  Goal Directed  Orientation:  Full (Time, Place, and Person)  Thought Content:  Negative  Suicidal Thoughts:  No  Homicidal Thoughts:  No  Memory:  Immediate;   Good  Judgement:  Good  Insight:  Good  Psychomotor Activity:  Normal  Concentration:  Good  Recall:  Fair  Fund of Knowledge:Fair  Language: Fair  Akathisia:  No  Handed:  Right  AIMS (if indicated):     Assets:  Communication Skills Desire for Improvement Financial Resources/Insurance Social Support  ADL's:  Intact  Cognition: WNL  Sleep:      Treatment Plan Summary: Medication management and Plan No change to medication management. Continue Prozac which she finds very helpful for her mood as well as the standing Klonopin. As mentioned above I did a lot of education with her about the risks of continued use of narcotics along with benzodiazepines including the potential risk of death. Patient was educated about the importance of managing her medications in the most careful way possible and educated about the use of pill boxes. Follow-up in 6 months or sooner if needed. I will talk with the daughter when the form is to be filled out about placement.  Mordecai Rasmussen, MD 06/20/2015, 7:46 PM

## 2015-07-02 ENCOUNTER — Telehealth: Payer: Self-pay

## 2015-07-02 NOTE — Telephone Encounter (Signed)
I had not recently been prescribing Ambien to [name] and I am not comfortable restarting it with her already using narcotics and sedatives. I am not going to restart that at this time.

## 2015-07-02 NOTE — Telephone Encounter (Signed)
received a fax requesting a refill on zollpidem tartrate  take one tablet at bedtime #30  pt was last seen on  06-20-15

## 2015-07-03 NOTE — Telephone Encounter (Signed)
pharmacy notified.

## 2015-07-10 ENCOUNTER — Ambulatory Visit: Payer: Medicare Other | Attending: Pain Medicine | Admitting: Pain Medicine

## 2015-07-10 ENCOUNTER — Encounter: Payer: Self-pay | Admitting: Pain Medicine

## 2015-07-10 VITALS — BP 127/76 | HR 62 | Temp 98.3°F | Resp 18 | Ht 66.0 in | Wt 129.0 lb

## 2015-07-10 DIAGNOSIS — M797 Fibromyalgia: Secondary | ICD-10-CM | POA: Insufficient documentation

## 2015-07-10 DIAGNOSIS — M5030 Other cervical disc degeneration, unspecified cervical region: Secondary | ICD-10-CM | POA: Insufficient documentation

## 2015-07-10 DIAGNOSIS — M47812 Spondylosis without myelopathy or radiculopathy, cervical region: Secondary | ICD-10-CM

## 2015-07-10 DIAGNOSIS — M542 Cervicalgia: Secondary | ICD-10-CM | POA: Diagnosis present

## 2015-07-10 DIAGNOSIS — M546 Pain in thoracic spine: Secondary | ICD-10-CM | POA: Diagnosis present

## 2015-07-10 DIAGNOSIS — M5481 Occipital neuralgia: Secondary | ICD-10-CM | POA: Diagnosis not present

## 2015-07-10 DIAGNOSIS — M5136 Other intervertebral disc degeneration, lumbar region: Secondary | ICD-10-CM | POA: Insufficient documentation

## 2015-07-10 DIAGNOSIS — M47816 Spondylosis without myelopathy or radiculopathy, lumbar region: Secondary | ICD-10-CM

## 2015-07-10 DIAGNOSIS — M533 Sacrococcygeal disorders, not elsewhere classified: Secondary | ICD-10-CM

## 2015-07-10 DIAGNOSIS — M51369 Other intervertebral disc degeneration, lumbar region without mention of lumbar back pain or lower extremity pain: Secondary | ICD-10-CM

## 2015-07-10 DIAGNOSIS — M4804 Spinal stenosis, thoracic region: Secondary | ICD-10-CM

## 2015-07-10 DIAGNOSIS — M48061 Spinal stenosis, lumbar region without neurogenic claudication: Secondary | ICD-10-CM

## 2015-07-10 DIAGNOSIS — R51 Headache: Secondary | ICD-10-CM | POA: Diagnosis present

## 2015-07-10 DIAGNOSIS — G43109 Migraine with aura, not intractable, without status migrainosus: Secondary | ICD-10-CM

## 2015-07-10 MED ORDER — TOPIRAMATE 25 MG PO TABS: ORAL_TABLET | ORAL | Status: DC

## 2015-07-10 MED ORDER — OXYCODONE-ACETAMINOPHEN 7.5-325 MG PO TABS: ORAL_TABLET | ORAL | Status: DC

## 2015-07-10 NOTE — Patient Instructions (Addendum)
PLAN   Continue present medication Topamax and oxycodone acetaminophen  Greater occipital nerve block to be performed at time of return appointment  F/U PCP  Tejan-Sie for evaliation of  BP UTI and general medical condition  F/U surgical evaluation. May consider pending follow-up evaluations. We will avoid at this time  Consider cardiac evaluation as discussed  F/U neurological evaluation. May consider pending follow-up evaluations  May consider radiofrequency rhizolysis or intraspinal procedures pending response to present treatment and F/U evaluation . We will avoid considering these procedures at this time  Patient to call [facility] should patient have concerns prior to scheduled return appointmentOccipital Nerve Block Patient Information  Description: The occipital nerves originate in the cervical (neck) spinal cord and travel upward through muscle and tissue to supply sensation to the back of the head and top of the scalp.  In addition, the nerves control some of the muscles of the scalp.  Occipital neuralgia is an irritation of these nerves which can cause headaches, numbness of the scalp, and neck discomfort.     The occipital nerve block will interrupt nerve transmission through these nerves and can relieve pain and spasm.  The block consists of insertion of a small needle under the skin in the back of the head to deposit local anesthetic (numbing medicine) and/or steroids around the nerve.  The entire block usually lasts less than 5 minutes.  Conditions which may be treated by occipital blocks:   Muscular pain and spasm of the scalp  Nerve irritation, back of the head  Headaches  Upper neck pain  Preparation for the injection:  1. Do not eat any solid food or dairy products within 6 hours of your appointment. 2. You may drink clear liquids up to 2 hours before appointment.  Clear liquids include water, black coffee, juice or soda.  No milk or cream  please. 3. You may take your regular medication, including pain medications, with a sip of water before you appointment.  Diabetics should hold regular insulin (if taken separately) and take 1/2 normal NPH dose the morning of the procedure.  Carry some sugar containing items with you to your appointment. 4. A driver must accompany you and be prepared to drive you home after your procedure. 5. Bring all your current medications with you. 6. An IV may be inserted and sedation may be given at the discretion of the physician. 7. A blood pressure cuff, EKG, and other monitors will often be applied during the procedure.  Some patients may need to have extra oxygen administered for a short period. 8. You will be asked to provide medical information, including your allergies and medications, prior to the procedure.  We must know immediately if you are taking blood thinners (like Coumadin/Warfarin) or if you are allergic to IV iodine contrast (dye).  We must know if you could possible be pregnant.  9. Do not wear a high collared shirt or turtleneck.  Tie long hair up in the back if possible.  Possible side-effects:   Bleeding from needle site  Infection (rare, may require surgery)  Nerve injury (rare)  Hair on back of neck can be tinged with iodine scrub (this will wash out)  Light-headedness (temporary)  Pain at injection site (several days)  Decreased blood pressure (rare, temporary)  Seizure (very rare)  Call if you experience:   Hives or difficulty breathing ( go to the emergency room)  Inflammation or drainage at the injection site(s)  Please note:  Although  the local anesthetic injected can often make your painful muscles or headache feel good for several hours after the injection, the pain may return.  It takes 3-7 days for steroids to work.  You may not notice any pain relief for at least one week.  If effective, we will often do a series of injections spaced 3-6 weeks apart to  maximally decrease your pain.  If you have any questions, please call [phone] [facility]

## 2015-07-10 NOTE — Progress Notes (Signed)
Subjective:    Patient ID: [name], female    DOB: Sep 28, 1945, 70 y.o.   MRN: [id number]  HPI   The patient is a 70 year old female who returns to pain management for further evaluation and treatment of pain involving headaches as well as pain of the neck mid back lower back upper and lower extremity regions. The patient states that she has significant pain and spasms occurring in the upper back neck region with pain of the neck causing significant headaches. The patient denies any trauma or change in events of daily living the call significant change in symptomatology. We discussed patient's condition and will consider patient for interventional treatment consisting of greater occipital nerve blocks to be performed at time of return appointment. The patient states that the pain is present throughout the day and increases at times depending upon patient's activity. We will consider further evaluation including neurological evaluation pending response to treatment and follow-up evaluation. The patient continues under the care of her primary care physician Dr. Ellsworth Lennox and will be considered for further studies pending response to treatment and follow-up evaluation. The patient was with understanding and agreed with suggested treatment plan. We will proceed with greater occipital nerve block at time return appointment in attempt to decrease severity of symptoms, minimize progression of symptoms, and avoid the need for more involved treatment. The patient agreed to suggested treatment plan  Review of Systems     Objective:   Physical Exam  There was tenderness of the paraspinal muscular region cervical region cervical facet region palpation which reproduces moderately severe discomfort. There was moderate tenderness of the splenius capitis and occipitalis musculature regions. No masses of the head and neck were noted. No bounding pulsations of the temporal region were noted. There was tenderness  of the cervical facet as well as thoracic facet region with no crepitus of the thoracic region noted. Palpation of the trapezius levator scapula and rhomboid musculature regions reproduce moderate to moderately severe discomfort. Palpation over the lumbar paraspinal musculature region lumbar facet region was attends to palpation or bending and extension and palpation of the lumbar facets reproducing moderate discomfort. There was moderate tenderness over the PSIS PII S region as well as the gluteal and piriformis musculature regions. Straight leg raising was tolerates approximately 30 without increased pain with dorsiflexion noted. There was negative clonus negative Homans. No definite sensory deficit or dermatomal distribution was detected. EHL strength appeared to be slightly decreased. The abdomen was nontender with no costovertebral tenderness noted there was tenderness along the greater trochanteric region and iliotibial band region on the left as well as on the right. The predominant portion of patient's pain was reproduced with palpation of the splenius capitis and occipitalis musculature regions in the paraspinal musculatures of the cervical region      Assessment & Plan:   Bilateral occipital neuralgia   Sacroiliac joint dysfunction  Degenerative disc disease lumbar spine L4-5 L3-4 degenerative changes predominantly  Degenerative disc disease of the cervical spine  Cervical facet syndrome  Fibromyalgia      PLAN   Continue present medication Topamax and oxycodone acetaminophen  Greater occipital nerve block to be performed at time of return appointment  F/U PCP  Tejan-Sie for evaliation of  BP UTI and general medical condition  F/U surgical evaluation. May consider pending follow-up evaluations. We will avoid at this time  Consider cardiac evaluation as discussed  F/U neurological evaluation. May consider pending follow-up evaluations  May consider radiofrequency  rhizolysis  or intraspinal procedures pending response to present treatment and F/U evaluation

## 2015-07-22 ENCOUNTER — Ambulatory Visit: Payer: Medicare Other | Attending: Pain Medicine | Admitting: Pain Medicine

## 2015-07-22 ENCOUNTER — Encounter: Payer: Self-pay | Admitting: Pain Medicine

## 2015-07-22 VITALS — BP 133/84 | HR 72 | Temp 97.8°F | Resp 14 | Ht 66.0 in | Wt 131.0 lb

## 2015-07-22 DIAGNOSIS — R51 Headache: Secondary | ICD-10-CM | POA: Insufficient documentation

## 2015-07-22 DIAGNOSIS — M4804 Spinal stenosis, thoracic region: Secondary | ICD-10-CM

## 2015-07-22 DIAGNOSIS — M533 Sacrococcygeal disorders, not elsewhere classified: Secondary | ICD-10-CM

## 2015-07-22 DIAGNOSIS — M5481 Occipital neuralgia: Secondary | ICD-10-CM

## 2015-07-22 DIAGNOSIS — G43109 Migraine with aura, not intractable, without status migrainosus: Secondary | ICD-10-CM

## 2015-07-22 DIAGNOSIS — M5136 Other intervertebral disc degeneration, lumbar region: Secondary | ICD-10-CM

## 2015-07-22 DIAGNOSIS — M47816 Spondylosis without myelopathy or radiculopathy, lumbar region: Secondary | ICD-10-CM

## 2015-07-22 DIAGNOSIS — M48061 Spinal stenosis, lumbar region without neurogenic claudication: Secondary | ICD-10-CM

## 2015-07-22 DIAGNOSIS — M542 Cervicalgia: Secondary | ICD-10-CM | POA: Insufficient documentation

## 2015-07-22 DIAGNOSIS — M47812 Spondylosis without myelopathy or radiculopathy, cervical region: Secondary | ICD-10-CM

## 2015-07-22 MED ORDER — BUPIVACAINE HCL (PF) 0.25 % IJ SOLN: 30.0000 mL | Freq: Once | INTRAMUSCULAR | Status: DC

## 2015-07-22 MED ORDER — FENTANYL CITRATE (PF) 100 MCG/2ML IJ SOLN: 100.0000 ug | Freq: Once | INTRAMUSCULAR | Status: DC

## 2015-07-22 MED ORDER — TOPIRAMATE 25 MG PO TABS: ORAL_TABLET | ORAL | Status: DC

## 2015-07-22 MED ORDER — ORPHENADRINE CITRATE 30 MG/ML IJ SOLN: 60.0000 mg | Freq: Once | INTRAMUSCULAR | Status: DC

## 2015-07-22 MED ORDER — OXYCODONE-ACETAMINOPHEN 7.5-325 MG PO TABS: ORAL_TABLET | ORAL | Status: DC

## 2015-07-22 MED ORDER — BUPIVACAINE HCL (PF) 0.25 % IJ SOLN
INTRAMUSCULAR | Status: AC
  Administered 2015-07-22: 12:00:00
  Filled 2015-07-22: qty 30

## 2015-07-22 MED ORDER — FENTANYL CITRATE (PF) 100 MCG/2ML IJ SOLN
INTRAMUSCULAR | Status: AC
  Administered 2015-07-22: 100 ug via INTRAVENOUS
  Filled 2015-07-22: qty 2

## 2015-07-22 MED ORDER — TRIAMCINOLONE ACETONIDE 40 MG/ML IJ SUSP
INTRAMUSCULAR | Status: AC
  Administered 2015-07-22: 12:00:00
  Filled 2015-07-22: qty 1

## 2015-07-22 MED ORDER — TRIAMCINOLONE ACETONIDE 40 MG/ML IJ SUSP: 40.0000 mg | Freq: Once | INTRAMUSCULAR | Status: DC

## 2015-07-22 MED ORDER — ORPHENADRINE CITRATE 30 MG/ML IJ SOLN
INTRAMUSCULAR | Status: AC
  Administered 2015-07-22: 12:00:00
  Filled 2015-07-22: qty 2

## 2015-07-22 MED ORDER — MIDAZOLAM HCL 5 MG/5ML IJ SOLN
5.0000 mg | Freq: Once | INTRAMUSCULAR | Status: DC
Start: 2015-07-22 — End: 2017-01-04

## 2015-07-22 MED ORDER — LACTATED RINGERS IV SOLN: 1000.0000 mL | INTRAVENOUS | Status: DC

## 2015-07-22 MED ORDER — MIDAZOLAM HCL 5 MG/5ML IJ SOLN
INTRAMUSCULAR | Status: AC
  Administered 2015-07-22: 4 mg via INTRAVENOUS
  Filled 2015-07-22: qty 5

## 2015-07-22 NOTE — Progress Notes (Signed)
   Subjective:    Patient ID: [name], female    DOB: [birth year], 70 y.o.   MRN: [id number]  HPI  NOTE: The patient is a 70 y.o.-year-old female who [facility] the [facility] for further evaluation and treatment of pain consisting of pain involving the region of the neck and headache.  Patient is with.pain which radiates from the cervical region toward the occipitalis region. The patient states that the pain causes headaches the back of the head radiating toward the front of the head and behind the eyes. There is concern regarding patient's headaches been due to greater occipital neuralgia.   The risks, benefits, and expectations of the procedure have been discussed and explained to patient, who is understanding and wishes to proceed with interventional treatment as discussed and as explained to patient.  Will proceed with greater occipital nerve blocks with myoneural block injections at this time as discussed and as explained to patient.  All are understanding and in agreement with suggested treatment plan.    PROCEDURE:  Greater occipital nerve block on the  left side with IV Versed, IV Fentanyl, conscious sedation, EKG, blood pressure, pulse, pulse oximetry monitoring.  Procedure performed with patient in prone position.  Greater occipital nerve block on the left side.   With patient in prone position, Betadine prep of proposed entry site accomplished.  Following identification of the nuchal ridge, 22 -gauge needle was inserted at the level of the nuchal ridge medial to the occipital artery.  Following negative aspiration, 4cc 0.25% bupivacaine with Kenalog injected for left greater occipital nerve block.  Needle was removed.  Patient tolerated injection well.   Greater occipital nerve block on the rightt side. The greater occipital nerve block on the right side was performed exactly as the left greater occipital nerve block was performed and utilizing the same  technique.  Myoneural block injection of the cervical paraspinal musculature region. Following Betadine prep of proposed entry site a 22-gauge needle was inserted in the cervical musculature region and following negative aspiration 2 cc of 0.25% bupivacaine with Norflex was injected for myoneural block injection of the cervical region 4.  The patient tolerated the procedure well   A total of 10 mg Kenalog was utilized for the entire procedure.  PLAN:    1. Medications: Will continue presently prescribed medications Topamax and oxycodone  at this time. 2. Patient to follow up with primary care physician   Dr. Ellsworth Lennoxejan-Sie for evaluation of blood pressure and general medical condition status post procedure performed on today's visit. 3. Neurological evaluation for further assessment of headaches for further studies as discussed. 4. Surgical evaluation as discussed.  5. Patient may be candidate for Botox injections, radiofrequency procedures, as well as implantation type procedures pending response to treatment rendered on today's visit and pending follow-up evaluation. 6. Patient has been advised to adhere to proper body mechanics and to avoid activities which appear to aggravate condition.cations:  Will continue presently prescribed medications at this time. 7. The patient is understanding and in agreement with the suggested treatment plan.   Review of Systems     Objective:   Physical Exam        Assessment & Plan:

## 2015-07-22 NOTE — Patient Instructions (Addendum)
PLAN   Continue present medication Topamax and oxycodone acetaminophen  F/U PCP  Tejan-Sie for evaliation of BP and general medical condition  F/U surgical evaluation. May consider pending follow-up evaluations. We will avoid at this time  F/U neurological evaluation. May consider pending follow-up evaluations  May consider radiofrequency rhizolysis or intraspinal procedures pending response to present treatment and F/U evaluation . We will avoid considering these procedures at this time  Patient to call Pain Management Center should patient have concerns prior to scheduled return appointmentPain Management Discharge Instructions  General Discharge Instructions :  If you need to reach your doctor call: Monday-Friday 8:00 am - 4:00 pm at [phone] or toll free [phone].  After clinic hours [phone] to have operator reach doctor.  Bring all of your medication bottles to all your appointments in the pain clinic.  To cancel or reschedule your appointment with Pain Management please remember to call 24 hours in advance to avoid a fee.  Refer to the educational materials which you have been given on: General Risks, I had my Procedure. Discharge Instructions, Post Sedation.  Post Procedure Instructions:  The drugs you were given will stay in your system until tomorrow, so for the next 24 hours you should not drive, make any legal decisions or drink any alcoholic beverages.  You may eat anything you prefer, but it is better to start with liquids then soups and crackers, and gradually work up to solid foods.  Please notify your doctor immediately if you have any unusual bleeding, trouble breathing or pain that is not related to your normal pain.  Depending on the type of procedure that was done, some parts of your body may feel week and/or numb.  This usually clears up by tonight or the next day.  Walk with the use of an assistive device or accompanied by an adult for the 24  hours.  You may use ice on the affected area for the first 24 hours.  Put ice in a Ziploc bag and cover with a towel and place against area 15 minutes on 15 minutes off.  You may switch to heat after 24 hours.GENERAL RISKS AND COMPLICATIONS  What are the risk, side effects and possible complications? Generally speaking, most procedures are safe.  However, with any procedure there are risks, side effects, and the possibility of complications.  The risks and complications are dependent upon the sites that are lesioned, or the type of nerve block to be performed.  The closer the procedure is to the spine, the more serious the risks are.  Great care is taken when placing the radio frequency needles, block needles or lesioning probes, but sometimes complications can occur. 1. Infection: Any time there is an injection through the skin, there is a risk of infection.  This is why sterile conditions are used for these blocks.  There are four possible types of infection. 1. Localized skin infection. 2. Central Nervous System Infection-This can be in the form of Meningitis, which can be deadly. 3. Epidural Infections-This can be in the form of an epidural abscess, which can cause pressure inside of the spine, causing compression of the spinal cord with subsequent paralysis. This would require an emergency surgery to decompress, and there are no guarantees that the patient would recover from the paralysis. 4. Discitis-This is an infection of the intervertebral discs.  It occurs in about 1% of discography procedures.  It is difficult to treat and it may lead to surgery.  2. Pain: the needles have to go through skin and soft tissues, will cause soreness.       3. Damage to internal structures:  The nerves to be lesioned may be near blood vessels or    other nerves which can be potentially damaged.       4. Bleeding: Bleeding is more common if the patient is taking blood thinners such as  aspirin, Coumadin,  Ticiid, Plavix, etc., or if he/she have some genetic predisposition  such as hemophilia. Bleeding into the spinal canal can cause compression of the spinal  cord with subsequent paralysis.  This would require an emergency surgery to  decompress and there are no guarantees that the patient would recover from the  paralysis.       5. Pneumothorax:  Puncturing of a lung is a possibility, every time a needle is introduced in  the area of the chest or upper back.  Pneumothorax refers to free air around the  collapsed lung(s), inside of the thoracic cavity (chest cavity).  Another two possible  complications related to a similar event would include: Hemothorax and Chylothorax.   These are variations of the Pneumothorax, where instead of air around the collapsed  lung(s), you may have blood or chyle, respectively.       6. Spinal headaches: They may occur with any procedures in the area of the spine.       7. Persistent CSF (Cerebro-Spinal Fluid) leakage: This is a rare problem, but may occur  with prolonged intrathecal or epidural catheters either due to the formation of a fistulous  track or a dural tear.       8. Nerve damage: By working so close to the spinal cord, there is always a possibility of  nerve damage, which could be as serious as a permanent spinal cord injury with  paralysis.       9. Death:  Although rare, severe deadly allergic reactions known as "Anaphylactic  reaction" can occur to any of the medications used.      10. Worsening of the symptoms:  We can always make thing worse.  What are the chances of something like this happening? Chances of any of this occuring are extremely low.  By statistics, you have more of a chance of getting killed in a motor vehicle accident: while driving to the hospital than any of the above occurring .  Nevertheless, you should be aware that they are possibilities.  In general, it is similar to taking a shower.  Everybody knows that you can slip, hit your head and  get killed.  Does that mean that you should not shower again?  Nevertheless always keep in mind that statistics do not mean anything if you happen to be on the wrong side of them.  Even if a procedure has a 1 (one) in a 1,000,000 (million) chance of going wrong, it you happen to be that one..Also, keep in mind that by statistics, you have more of a chance of having something go wrong when taking medications.  Who should not have this procedure? If you are on a blood thinning medication (e.g. Coumadin, Plavix, see list of "Blood Thinners"), or if you have an active infection going on, you should not have the procedure.  If you are taking any blood thinners, please inform your physician.  How should I prepare for this procedure?  Do not eat or drink anything at least six hours prior to the procedure.  Bring a driver  with you .  It cannot be a taxi.  Come accompanied by an adult that can drive you back, and that is strong enough to help you if your legs get weak or numb from the local anesthetic.  Take all of your medicines the morning of the procedure with just enough water to swallow them.  If you have diabetes, make sure that you are scheduled to have your procedure done first thing in the morning, whenever possible.  If you have diabetes, take only half of your insulin dose and notify our nurse that you have done so as soon as you arrive at the clinic.  If you are diabetic, but only take blood sugar pills (oral hypoglycemic), then do not take them on the morning of your procedure.  You may take them after you have had the procedure.  Do not take aspirin or any aspirin-containing medications, at least eleven (11) days prior to the procedure.  They may prolong bleeding.  Wear loose fitting clothing that may be easy to take off and that you would not mind if it got stained with Betadine or blood.  Do not wear any jewelry or perfume  Remove any nail coloring.  It will interfere with some of  our monitoring equipment.  NOTE: Remember that this is not meant to be interpreted as a complete list of all possible complications.  Unforeseen problems may occur.  BLOOD THINNERS The following drugs contain aspirin or other products, which can cause increased bleeding during surgery and should not be taken for 2 weeks prior to and 1 week after surgery.  If you should need take something for relief of minor pain, you may take acetaminophen which is found in Tylenol,m Datril, Anacin-3 and Panadol. It is not blood thinner. The products listed below are.  Do not take any of the products listed below in addition to any listed on your instruction sheet.  A.P.C or A.P.C with Codeine Codeine Phosphate Capsules #3 Ibuprofen Ridaura  ABC compound Congesprin Imuran rimadil  Advil Cope Indocin Robaxisal  Alka-Seltzer Effervescent Pain Reliever and Antacid Coricidin or Coricidin-D  Indomethacin Rufen  Alka-Seltzer plus Cold Medicine Cosprin Ketoprofen S-A-C Tablets  Anacin Analgesic Tablets or Capsules Coumadin Korlgesic Salflex  Anacin Extra Strength Analgesic tablets or capsules CP-2 Tablets Lanoril Salicylate  Anaprox Cuprimine Capsules Levenox Salocol  Anexsia-D Dalteparin Magan Salsalate  Anodynos Darvon compound Magnesium Salicylate Sine-off  Ansaid Dasin Capsules Magsal Sodium Salicylate  Anturane Depen Capsules Marnal Soma  APF Arthritis pain formula Dewitt's Pills Measurin Stanback  Argesic Dia-Gesic Meclofenamic Sulfinpyrazone  Arthritis Bayer Timed Release Aspirin Diclofenac Meclomen Sulindac  Arthritis pain formula Anacin Dicumarol Medipren Supac  Analgesic (Safety coated) Arthralgen Diffunasal Mefanamic Suprofen  Arthritis Strength Bufferin Dihydrocodeine Mepro Compound Suprol  Arthropan liquid Dopirydamole Methcarbomol with Aspirin Synalgos  ASA tablets/Enseals Disalcid Micrainin Tagament  Ascriptin Doan's Midol Talwin  Ascriptin A/D Dolene Mobidin Tanderil  Ascriptin Extra Strength  Dolobid Moblgesic Ticlid  Ascriptin with Codeine Doloprin or Doloprin with Codeine Momentum Tolectin  Asperbuf Duoprin Mono-gesic Trendar  Aspergum Duradyne Motrin or Motrin IB Triminicin  Aspirin plain, buffered or enteric coated Durasal Myochrisine Trigesic  Aspirin Suppositories Easprin Nalfon Trillsate  Aspirin with Codeine Ecotrin Regular or Extra Strength Naprosyn Uracel  Atromid-S Efficin Naproxen Ursinus  Auranofin Capsules Elmiron Neocylate Vanquish  Axotal Emagrin Norgesic Verin  Azathioprine Empirin or Empirin with Codeine Normiflo Vitamin E  Azolid Emprazil Nuprin Voltaren  Bayer Aspirin plain, buffered or children's or timed BC Tablets or powders  Encaprin Orgaran Warfarin Sodium  Buff-a-Comp Enoxaparin Orudis Zorpin  Buff-a-Comp with Codeine Equegesic Os-Cal-Gesic   Buffaprin Excedrin plain, buffered or Extra Strength Oxalid   Bufferin Arthritis Strength Feldene Oxphenbutazone   Bufferin plain or Extra Strength Feldene Capsules Oxycodone with Aspirin   Bufferin with Codeine Fenoprofen Fenoprofen Pabalate or Pabalate-SF   Buffets II Flogesic Panagesic   Buffinol plain or Extra Strength Florinal or Florinal with Codeine Panwarfarin   Buf-Tabs Flurbiprofen Penicillamine   Butalbital Compound Four-way cold tablets Penicillin   Butazolidin Fragmin Pepto-Bismol   Carbenicillin Geminisyn Percodan   Carna Arthritis Reliever Geopen Persantine   Carprofen Gold's salt Persistin   Chloramphenicol Goody's Phenylbutazone   Chloromycetin Haltrain Piroxlcam   Clmetidine heparin Plaquenil   Cllnoril Hyco-pap Ponstel   Clofibrate Hydroxy chloroquine Propoxyphen         Before stopping any of these medications, be sure to consult the physician who ordered them.  Some, such as Coumadin (Warfarin) are ordered to prevent or treat serious conditions such as "deep thrombosis", "pumonary embolisms", and other heart problems.  The amount of time that you may need off of the medication may also  vary with the medication and the reason for which you were taking it.  If you are taking any of these medications, please make sure you notify your pain physician before you undergo any procedures.  Oxycodone prescription given

## 2015-07-23 ENCOUNTER — Telehealth: Payer: Self-pay | Admitting: *Deleted

## 2015-07-23 NOTE — Telephone Encounter (Signed)
Message left

## 2015-08-07 ENCOUNTER — Ambulatory Visit: Payer: Medicare Other | Attending: Pain Medicine | Admitting: Pain Medicine

## 2015-08-07 ENCOUNTER — Encounter: Payer: Self-pay | Admitting: Pain Medicine

## 2015-08-07 VITALS — BP 110/82 | HR 62 | Temp 97.8°F | Resp 16 | Ht 66.0 in | Wt 130.0 lb

## 2015-08-07 DIAGNOSIS — M5030 Other cervical disc degeneration, unspecified cervical region: Secondary | ICD-10-CM | POA: Diagnosis not present

## 2015-08-07 DIAGNOSIS — M542 Cervicalgia: Secondary | ICD-10-CM | POA: Diagnosis present

## 2015-08-07 DIAGNOSIS — M797 Fibromyalgia: Secondary | ICD-10-CM | POA: Diagnosis not present

## 2015-08-07 DIAGNOSIS — G43909 Migraine, unspecified, not intractable, without status migrainosus: Secondary | ICD-10-CM | POA: Diagnosis not present

## 2015-08-07 DIAGNOSIS — M47816 Spondylosis without myelopathy or radiculopathy, lumbar region: Secondary | ICD-10-CM | POA: Diagnosis not present

## 2015-08-07 DIAGNOSIS — M5136 Other intervertebral disc degeneration, lumbar region: Secondary | ICD-10-CM | POA: Diagnosis not present

## 2015-08-07 DIAGNOSIS — M546 Pain in thoracic spine: Secondary | ICD-10-CM | POA: Diagnosis present

## 2015-08-07 DIAGNOSIS — R51 Headache: Secondary | ICD-10-CM | POA: Diagnosis present

## 2015-08-07 DIAGNOSIS — M4804 Spinal stenosis, thoracic region: Secondary | ICD-10-CM

## 2015-08-07 DIAGNOSIS — M48061 Spinal stenosis, lumbar region without neurogenic claudication: Secondary | ICD-10-CM

## 2015-08-07 DIAGNOSIS — M47812 Spondylosis without myelopathy or radiculopathy, cervical region: Secondary | ICD-10-CM

## 2015-08-07 DIAGNOSIS — M533 Sacrococcygeal disorders, not elsewhere classified: Secondary | ICD-10-CM | POA: Diagnosis not present

## 2015-08-07 DIAGNOSIS — G43109 Migraine with aura, not intractable, without status migrainosus: Secondary | ICD-10-CM

## 2015-08-07 DIAGNOSIS — M5481 Occipital neuralgia: Secondary | ICD-10-CM | POA: Insufficient documentation

## 2015-08-07 MED ORDER — TOPIRAMATE 25 MG PO TABS: ORAL_TABLET | ORAL | Status: DC

## 2015-08-07 MED ORDER — OXYCODONE-ACETAMINOPHEN 7.5-325 MG PO TABS: ORAL_TABLET | ORAL | Status: DC

## 2015-08-07 NOTE — Progress Notes (Signed)
Safety precautions to be maintained throughout the outpatient stay will include: orient to surroundings, keep bed in low position, maintain call bell within reach at all times, provide assistance with transfer out of bed and ambulation.  

## 2015-08-07 NOTE — Progress Notes (Signed)
Subjective:    Patient ID: [name], female    DOB: 1945-10-26, 70 y.o.   MRN: [id number]  HPI  The patient is a 70 year old female who returns to pain management for further evaluation and treatment of pain involving the neck associated with headaches as well as pain involving the upper mid lower back and lower extremity regions. The patient states that her headaches have been eliminated. The patient denies any significant headache since undergoing greater occipital nerve block. The patient has undergone follow-up evaluation with primary care physician Dr. Ellsworth Lennoxejan-Sie stated the patient's weight loss may be related to patient's use of Topamax. The patient also is taking thyroid replacement medicine which also may be contributing to patient's weight loss. She has been made to decrease patient's Topamax as well as thyroid medication. We will remain available to consider additional modifications of patient treatment regimen as discussed. The patient denies any trauma change in events of daily living the call significant change in symptomatology. The patient admits to some mild but back pain and pain radiating to the buttocks and states that overall she is doing quite well at this time and decision was made to observe response to the decrease in Topamax in terms of affect of patient's pain. All agreed to suggested treatment plan  Review of Systems     Objective:   Physical Exam  There was tenderness of the splenius capitis and occipitalis musculature regions of minimal degree. There was minimal tenderness of the cervical facet cervical paraspinal musculature region. There were no masses of the head and neck noted and no bounding pulsations of the temporal region were noted. Palpation over the region of the trapezius levator scapula and rhomboid musculature regions were without increase of pain of any significant degree. The patient appeared to be with bilaterally equal grip strength and Tinel and  Phalen's maneuver were without increase of pain of significant degree. Palpation over the lumbar paraspinal muscular treat and lumbar facet region was attends to palpation of mild to moderate degree with lateral bending rotation extension and palpation of the lumbar facets reproducing mild to moderate discomfort. Straight leg raising was tolerates approximately 30 without increased pain with dorsiflexion noted. There was tends to palpation over the region of the PSIS and PII S region a mild to moderate degree. There was mild to moderate tenderness of the greater trochanteric region and iliotibial band region. No sensory deficit or dermatomal distribution detected. There was negative clonus negative Homans. Abdomen was nontender with no costovertebral tenderness noted.      Assessment & Plan:      Bilateral occipital neuralgia   Migraine headaches  Sacroiliac joint dysfunction  Degenerative disc disease lumbar spine L4-5 L3-4 degenerative changes predominantly  Degenerative disc disease of the cervical spine  Cervical facet syndrome  Fibromyalgia      PLAN   Continue present medication Topamax and oxycodone acetaminophen as discussed and decrease Topamax to one half pill by mouth twice per day. Patient with decreased thyroid medication as per Dr. Ellsworth Lennoxejan-Sie and we will observe her response in terms of weight as discussed  F/U PCP  Tejan-Sie for evaliation of  BP cardiac condition weight and general medical condition  F/U surgical evaluation. May consider pending follow-up evaluations. We will avoid at this time  F/U neurological evaluation. May consider further evaluation of headaches and other studies  May consider radiofrequency rhizolysis or intraspinal procedures pending response to present treatment and F/U evaluation . We will avoid considering these procedures  at this time  Patient to call Pain Management Center should patient have concerns prior to scheduled return  appointment

## 2015-08-07 NOTE — Patient Instructions (Addendum)
PLAN   Continue present medication Topamax and oxycodone acetaminophen as discussed and decrease Topamax to one half pill by mouth twice per day  F/U PCP  Tejan-Sie for evaliation of  BP cardiac condition weight and general medical condition  F/U surgical evaluation. May consider pending follow-up evaluations. We will avoid at this time  F/U neurological evaluation. May consider further evaluation of headaches and other studies  May consider radiofrequency rhizolysis or intraspinal procedures pending response to present treatment and F/U evaluation . We will avoid considering these procedures at this time  Patient to call Pain Management Center should patient have concerns prior to scheduled return appointmentPain Management Discharge Instructions  General Discharge Instructions :  If you need to reach your doctor call: Monday-Friday 8:00 am - 4:00 pm at [phone] or toll free [phone].  After clinic hours [phone] to have operator reach doctor.  Bring all of your medication bottles to all your appointments in the pain clinic.  To cancel or reschedule your appointment with Pain Management please remember to call 24 hours in advance to avoid a fee.  Refer to the educational materials which you have been given on: General Risks, I had my Procedure. Discharge Instructions, Post Sedation.  Post Procedure Instructions:  The drugs you were given will stay in your system until tomorrow, so for the next 24 hours you should not drive, make any legal decisions or drink any alcoholic beverages.  You may eat anything you prefer, but it is better to start with liquids then soups and crackers, and gradually work up to solid foods.  Please notify your doctor immediately if you have any unusual bleeding, trouble breathing or pain that is not related to your normal pain.  Depending on the type of procedure that was done, some parts of your body may feel week and/or numb.  This usually  clears up by tonight or the next day.  Walk with the use of an assistive device or accompanied by an adult for the 24 hours.  You may use ice on the affected area for the first 24 hours.  Put ice in a Ziploc bag and cover with a towel and place against area 15 minutes on 15 minutes off.  You may switch to heat after 24 hours.

## 2015-08-15 ENCOUNTER — Other Ambulatory Visit: Payer: Self-pay | Admitting: Pain Medicine

## 2015-09-05 ENCOUNTER — Ambulatory Visit: Payer: Medicare Other | Attending: Pain Medicine | Admitting: Pain Medicine

## 2015-09-05 ENCOUNTER — Encounter: Payer: Self-pay | Admitting: Pain Medicine

## 2015-09-05 VITALS — BP 107/57 | HR 50 | Temp 98.4°F | Resp 16 | Ht 66.0 in | Wt 128.0 lb

## 2015-09-05 DIAGNOSIS — R51 Headache: Secondary | ICD-10-CM | POA: Diagnosis present

## 2015-09-05 DIAGNOSIS — M797 Fibromyalgia: Secondary | ICD-10-CM | POA: Insufficient documentation

## 2015-09-05 DIAGNOSIS — G43109 Migraine with aura, not intractable, without status migrainosus: Secondary | ICD-10-CM

## 2015-09-05 DIAGNOSIS — G43909 Migraine, unspecified, not intractable, without status migrainosus: Secondary | ICD-10-CM | POA: Diagnosis not present

## 2015-09-05 DIAGNOSIS — M5481 Occipital neuralgia: Secondary | ICD-10-CM | POA: Insufficient documentation

## 2015-09-05 DIAGNOSIS — M47812 Spondylosis without myelopathy or radiculopathy, cervical region: Secondary | ICD-10-CM

## 2015-09-05 DIAGNOSIS — M533 Sacrococcygeal disorders, not elsewhere classified: Secondary | ICD-10-CM | POA: Insufficient documentation

## 2015-09-05 DIAGNOSIS — M5136 Other intervertebral disc degeneration, lumbar region: Secondary | ICD-10-CM | POA: Diagnosis not present

## 2015-09-05 DIAGNOSIS — M545 Low back pain: Secondary | ICD-10-CM | POA: Diagnosis present

## 2015-09-05 DIAGNOSIS — M47816 Spondylosis without myelopathy or radiculopathy, lumbar region: Secondary | ICD-10-CM | POA: Insufficient documentation

## 2015-09-05 DIAGNOSIS — M48061 Spinal stenosis, lumbar region without neurogenic claudication: Secondary | ICD-10-CM

## 2015-09-05 DIAGNOSIS — M5030 Other cervical disc degeneration, unspecified cervical region: Secondary | ICD-10-CM | POA: Diagnosis not present

## 2015-09-05 DIAGNOSIS — F4310 Post-traumatic stress disorder, unspecified: Secondary | ICD-10-CM

## 2015-09-05 DIAGNOSIS — M4804 Spinal stenosis, thoracic region: Secondary | ICD-10-CM

## 2015-09-05 DIAGNOSIS — M542 Cervicalgia: Secondary | ICD-10-CM | POA: Diagnosis present

## 2015-09-05 MED ORDER — OXYCODONE-ACETAMINOPHEN 7.5-325 MG PO TABS: ORAL_TABLET | ORAL | Status: DC

## 2015-09-05 MED ORDER — TOPIRAMATE 25 MG PO TABS: ORAL_TABLET | ORAL | Status: DC

## 2015-09-05 NOTE — Patient Instructions (Addendum)
PLAN   Continue present medication Topamax and oxycodone acetaminophen as discussed   F/U PCP  Tejan-Sie for evaliation of  BP cardiac condition weight and general medical condition  F/U surgical evaluation. May consider pending follow-up evaluations. We will avoid at this time  F/U neurological evaluation. May consider further evaluation of headaches and other studies as previously discussed  May consider radiofrequency rhizolysis or intraspinal procedures pending response to present treatment and F/U evaluation . We will avoid considering these procedures at this time  Patient to call Pain Management Center should patient have concerns prior to scheduled return appointmentPain Management Discharge Instructions  General Discharge Instructions :  If you need to reach your doctor call: Monday-Friday 8:00 am - 4:00 pm at [phone] or toll free [phone].  After clinic hours [phone] to have operator reach doctor.  Bring all of your medication bottles to all your appointments in the pain clinic.  To cancel or reschedule your appointment with Pain Management please remember to call 24 hours in advance to avoid a fee.  Refer to the educational materials which you have been given on: General Risks, I had my Procedure. Discharge Instructions, Post Sedation.  Post Procedure Instructions:  The drugs you were given will stay in your system until tomorrow, so for the next 24 hours you should not drive, make any legal decisions or drink any alcoholic beverages.  You may eat anything you prefer, but it is better to start with liquids then soups and crackers, and gradually work up to solid foods.  Please notify your doctor immediately if you have any unusual bleeding, trouble breathing or pain that is not related to your normal pain.  Depending on the type of procedure that was done, some parts of your body may feel week and/or numb.  This usually clears up by tonight or the next  day.  Walk with the use of an assistive device or accompanied by an adult for the 24 hours.  You may use ice on the affected area for the first 24 hours.  Put ice in a Ziploc bag and cover with a towel and place against area 15 minutes on 15 minutes off.  You may switch to heat after 24 hours.

## 2015-09-05 NOTE — Progress Notes (Signed)
Safety precautions to be maintained throughout the outpatient stay will include: orient to surroundings, keep bed in low position, maintain call bell within reach at all times, provide assistance with transfer out of bed and ambulation.  

## 2015-09-05 NOTE — Progress Notes (Signed)
   Subjective:    Patient ID: [name], female    DOB: [birth year], 70 y.o.   MRN: [id number]  HPI  The patient is a 70 year old female who returns to pain management for further evaluation and treatment of pain involving the region of the neck associated with headaches as well as pain involving the mid lower back lower extremity region. Patient states that she has some exacerbation of her pain which she attributes to moving from one residence to another. The patient denies any other trauma change in events of daily living the call significant change in symptomatology. The patient continues Topamax and oxycodone at this time. We will remain available to consider patient for interventional treatment as well as modify medications pending follow-up evaluation. The patient agreed to suggested treatment plan     Review of Systems     Objective:   Physical Exam  There was tends to palpation of paraspinal misreading the cervical region cervical facet region a mild degree with mild tenderness over the splenius capitis and occipitalis musculature regions. Palpation over the thoracic facet thoracic paraspinal musculature region was attends to palpation of mild to moderate degree in no crepitus of the thoracic region noted. The patient appeared to be with bilaterally equal grip strength and Tinel and Phalen's maneuver were without increased pain of significant degree. There was minimal tenderness of the acromioclavicular and glenohumeral joint region and patient was at unremarkable Spurling's maneuver. Palpation over the lumbar paraspinal musculatures and lumbar facet region was attends to palpation of mild to moderate degree with lateral bending rotation extension and palpation of lumbar facets reproducing moderate discomfort. There was mild to moderate tenderness of the PSIS and PII S region as well as the gluteal and piriformis musculature region. There was moderate increase of pain with pressure applied  to the ileum with patient in lateral decubitus position with palpation of the greater trochanteric region iliotibial band region reproducing moderate discomfort as well. Straight leg raising was tolerated to 30 without increased pain with dorsiflexion noted. No definite sensory deficit or dermatomal dystrophy detected. DTRs appeared to be trace at the knees. EHL strength was decreased the knees were tenderness to palpation with crepitus of the knees with negative anterior and posterior drawer signs without ballottement of the patella. No increased warmth and erythema in the region of the knees were noted. The abdomen was nontender with no costovertebral tenderness noted    Assessment & Plan:      Bilateral occipital neuralgia   Migraine headaches  Sacroiliac joint dysfunction  Degenerative disc disease lumbar spine L4-5 L3-4 degenerative changes predominantly  Degenerative disc disease of the cervical spine  Cervical facet syndrome  Fibromyalgia     PLAN   Continue present medication Topamax and oxycodone acetaminophen as discussed   F/U PCP  [facility] for evaliation of  BP cardiac condition weight and general medical condition  F/U surgical evaluation. May consider pending follow-up evaluations. We will avoid at this time  F/U neurological evaluation. May consider further evaluation of headaches and other studies as previously discussed  May consider radiofrequency rhizolysis or intraspinal procedures pending response to present treatment and F/U evaluation . We will avoid considering these procedures at this time  Patient to call [facility] should patient have concerns prior to scheduled return appointment

## 2015-10-01 ENCOUNTER — Ambulatory Visit: Payer: Medicare Other | Attending: Pain Medicine | Admitting: Pain Medicine

## 2015-10-01 ENCOUNTER — Encounter: Payer: Self-pay | Admitting: Pain Medicine

## 2015-10-01 VITALS — BP 108/59 | HR 59 | Temp 98.6°F | Resp 16 | Ht 66.0 in | Wt 126.0 lb

## 2015-10-01 DIAGNOSIS — M47812 Spondylosis without myelopathy or radiculopathy, cervical region: Secondary | ICD-10-CM

## 2015-10-01 DIAGNOSIS — M5481 Occipital neuralgia: Secondary | ICD-10-CM

## 2015-10-01 DIAGNOSIS — M5136 Other intervertebral disc degeneration, lumbar region: Secondary | ICD-10-CM

## 2015-10-01 DIAGNOSIS — M48061 Spinal stenosis, lumbar region without neurogenic claudication: Secondary | ICD-10-CM

## 2015-10-01 DIAGNOSIS — M47816 Spondylosis without myelopathy or radiculopathy, lumbar region: Secondary | ICD-10-CM

## 2015-10-01 DIAGNOSIS — M4804 Spinal stenosis, thoracic region: Secondary | ICD-10-CM

## 2015-10-01 DIAGNOSIS — M533 Sacrococcygeal disorders, not elsewhere classified: Secondary | ICD-10-CM

## 2015-10-01 DIAGNOSIS — G43109 Migraine with aura, not intractable, without status migrainosus: Secondary | ICD-10-CM

## 2015-10-01 MED ORDER — TOPIRAMATE 25 MG PO TABS: ORAL_TABLET | ORAL | Status: DC

## 2015-10-01 MED ORDER — OXYCODONE-ACETAMINOPHEN 7.5-325 MG PO TABS: ORAL_TABLET | ORAL | Status: DC

## 2015-10-01 NOTE — Progress Notes (Signed)
   Subjective:    Patient ID: [name], female    DOB: 11-24-1945, 70 y.o.   MRN: [id number]  HPI  The patient is a 70 year old female who returns to pain management for further evaluation and treatment of pain involving the neck associated with headaches as well as pain involving the entire back and lower extremity regions. At the present time patient states the pain is well controlled. Is tolerating Topamax and oxycodone without any undesirable side effects. We will continue present treatment regimen and patient will call pain management should there be significant change in condition prior to scheduled return appointment and we will consider modification of treatment regimen as discussed and as explained to patient on today's visit. The patient denies any trauma change in events of daily living the call significant change in symptomatology.  Review of Systems     Objective:   Physical Exam  There was mild tenderness of the splenius capitis and occipitalis musculature region with mild tenderness over the cervical facet cervical paraspinal must reason as well as the thoracic facet thoracic paraspinal musculature region. Palpation of the acromioclavicular and glenohumeral joint regions reproduces minimal discomfort and patient appeared to be with bilaterally equal grip strength with Tinel and Phalen's maneuver reproducing minimal discomfort as well. No crepitus of the thoracic region was noted. Palpation over the lumbar paraspinal musculature region lumbar facet region was attends to palpation of mild to moderate degree with lateral bending rotation extension and palpation over the lumbar facets reproducing mild to moderate discomfort. Straight leg raise was tolerates approximately 30 without increased pain with dorsiflexion noted. There was moderate tenderness to palpation of the greater trochanteric region and iliotibial band region. There was moderate increase of pain with pressure applied to  the ileum with patient in lateral decubitus position. There was no sensory deficit or dermatomal dystrophy detected. DTRs appeared to be trace at the knees. There was negative clonus negative Homans. Abdomen nontender with no costovertebral tenderness noted.      Assessment & Plan:     Bilateral occipital neuralgia   Migraine headaches  Sacroiliac joint dysfunction  Degenerative disc disease lumbar spine L4-5 L3-4 degenerative changes predominantly  Degenerative disc disease of the cervical spine  Cervical facet syndrome  Fibromyalgia       PLAN   Continue present medication Topamax and oxycodone acetaminophen as discussed   F/U PCP  [facility] for evaliation of  BP cardiac condition weight and general medical condition  F/U surgical evaluation. May consider pending follow-up evaluations. We will avoid at this time  F/U neurological evaluation. May consider further evaluation of headaches and other studies as previously discussed  May consider radiofrequency rhizolysis or intraspinal procedures pending response to present treatment and F/U evaluation . We will avoid considering these procedures at this time  Patient to call [facility] Center should patient have concerns prior to scheduled return appointment

## 2015-10-01 NOTE — Patient Instructions (Signed)
PLAN   Continue present medication Topamax and oxycodone acetaminophen as discussed   F/U PCP  [facility] for evaliation of  BP cardiac condition weight and general medical condition  F/U surgical evaluation. May consider pending follow-up evaluations. We will avoid at this time  F/U neurological evaluation. May consider further evaluation of headaches and other studies as previously discussed  May consider radiofrequency rhizolysis or intraspinal procedures pending response to present treatment and F/U evaluation . We will avoid considering these procedures at this time  Patient to call [facility] Center should patient have concerns prior to scheduled return appointment

## 2015-10-31 ENCOUNTER — Encounter: Payer: Medicare Other | Admitting: Pain Medicine

## 2015-11-04 ENCOUNTER — Telehealth: Payer: Self-pay | Admitting: *Deleted

## 2015-11-04 NOTE — Telephone Encounter (Signed)
The nurse call from [phone] stating that the pt oxycodone is out and she has 1 pill left. Please Amber she will be in the office until 5pm...thanks

## 2015-11-05 NOTE — Telephone Encounter (Signed)
Spoke with nurse at assisted living, patient is out of meds. Informed her that patient has appt tomorrow. States will wait until tomorrow for appt

## 2015-11-06 ENCOUNTER — Ambulatory Visit: Payer: Medicare Other | Attending: Pain Medicine | Admitting: Pain Medicine

## 2015-11-06 ENCOUNTER — Encounter: Payer: Self-pay | Admitting: Pain Medicine

## 2015-11-06 VITALS — BP 133/76 | HR 54 | Temp 98.5°F | Resp 16 | Ht 66.0 in | Wt 130.0 lb

## 2015-11-06 DIAGNOSIS — M545 Low back pain: Secondary | ICD-10-CM | POA: Diagnosis present

## 2015-11-06 DIAGNOSIS — M5481 Occipital neuralgia: Secondary | ICD-10-CM | POA: Diagnosis not present

## 2015-11-06 DIAGNOSIS — M47816 Spondylosis without myelopathy or radiculopathy, lumbar region: Secondary | ICD-10-CM | POA: Insufficient documentation

## 2015-11-06 DIAGNOSIS — R51 Headache: Secondary | ICD-10-CM | POA: Diagnosis present

## 2015-11-06 DIAGNOSIS — M797 Fibromyalgia: Secondary | ICD-10-CM | POA: Insufficient documentation

## 2015-11-06 DIAGNOSIS — G43909 Migraine, unspecified, not intractable, without status migrainosus: Secondary | ICD-10-CM | POA: Diagnosis not present

## 2015-11-06 DIAGNOSIS — M5030 Other cervical disc degeneration, unspecified cervical region: Secondary | ICD-10-CM | POA: Diagnosis not present

## 2015-11-06 DIAGNOSIS — M5136 Other intervertebral disc degeneration, lumbar region: Secondary | ICD-10-CM | POA: Insufficient documentation

## 2015-11-06 DIAGNOSIS — M48061 Spinal stenosis, lumbar region without neurogenic claudication: Secondary | ICD-10-CM

## 2015-11-06 DIAGNOSIS — M79606 Pain in leg, unspecified: Secondary | ICD-10-CM | POA: Diagnosis present

## 2015-11-06 DIAGNOSIS — M533 Sacrococcygeal disorders, not elsewhere classified: Secondary | ICD-10-CM | POA: Insufficient documentation

## 2015-11-06 DIAGNOSIS — G43109 Migraine with aura, not intractable, without status migrainosus: Secondary | ICD-10-CM

## 2015-11-06 DIAGNOSIS — M47812 Spondylosis without myelopathy or radiculopathy, cervical region: Secondary | ICD-10-CM

## 2015-11-06 DIAGNOSIS — M4804 Spinal stenosis, thoracic region: Secondary | ICD-10-CM

## 2015-11-06 MED ORDER — TOPIRAMATE 25 MG PO TABS: ORAL_TABLET | ORAL | Status: DC

## 2015-11-06 MED ORDER — OXYCODONE-ACETAMINOPHEN 7.5-325 MG PO TABS: ORAL_TABLET | ORAL | Status: DC

## 2015-11-06 NOTE — Progress Notes (Signed)
Safety precautions to be maintained throughout the outpatient stay will include: orient to surroundings, keep bed in low position, maintain call bell within reach at all times, provide assistance with transfer out of bed and ambulation.  

## 2015-11-06 NOTE — Patient Instructions (Addendum)
PLAN   Continue present medication Topamax and oxycodone acetaminophen as discussed   Block of nerves to the sacroiliac joint to be performed at time of return appointment  F/U PCP  [facility] for evaliation of  BP,cardiac condition, weigh,t and general medical condition  F/U surgical evaluation. May consider pending follow-up evaluations. We will avoid at this time  F/U neurological evaluation. May consider PNCV/EMG studies pending further evaluation as previously discussed  May consider radiofrequency rhizolysis or intraspinal procedures pending response to present treatment and F/U evaluation . We will avoid considering these procedures at this time  Patient to call [facility] Management Center should patient have concerns prior to scheduled return appointmentSacroiliac (SI) Joint Injection Patient Information  Description: The sacroiliac joint connects the scrum (very low back and tailbone) to the ilium (a pelvic bone which also forms half of the hip joint).  Normally this joint experiences very little motion.  When this joint becomes inflamed or unstable low back and or hip and pelvis pain may result.  Injection of this joint with local anesthetics (numbing medicines) and steroids can provide diagnostic information and reduce pain.  This injection is performed with the aid of x-ray guidance into the tailbone area while you are lying on your stomach.   You may experience an electrical sensation down the leg while this is being done.  You may also experience numbness.  We also may ask if we are reproducing your normal pain during the injection.  Conditions which may be treated SI injection:   Low back, buttock, hip or leg pain  Preparation for the Injection:  1. Do not eat any solid food or dairy products within 8 hours of your appointment.  2. You may drink clear liquids up to 3 hours before appointment.  Clear liquids include water, black coffee, juice or soda.  No milk or cream  please. 3. You may take your regular medications, including pain medications with a sip of water before your appointment.  Diabetics should hold regular insulin (if take separately) and take 1/2 normal NPH dose the morning of the procedure.  Carry some sugar containing items with you to your appointment. 4. A driver must accompany you and be prepared to drive you home after your procedure. 5. Bring all of your current medications with you. 6. An IV may be inserted and sedation may be given at the discretion of the physician. 7. A blood pressure cuff, EKG and other monitors will often be applied during the procedure.  Some patients may need to have extra oxygen administered for a short period.  8. You will be asked to provide medical information, including your allergies, prior to the procedure.  We must know immediately if you are taking blood thinners (like Coumadin/Warfarin) or if you are allergic to IV iodine contrast (dye).  We must know if you could possible be pregnant.  Possible side effects:   Bleeding from needle site  Infection (rare, may require surgery)  Nerve injury (rare)  Numbness & tingling (temporary)  A brief convulsion or seizure  Light-headedness (temporary)  Pain at injection site (several days)  Decreased blood pressure (temporary)  Weakness in the leg (temporary)   Call if you experience:   New onset weakness or numbness of an extremity below the injection site that last more than 8 hours.  Hives or difficulty breathing ( go to the emergency room)  Inflammation or drainage at the injection site  Any new symptoms which are concerning to you  Please  note:  Although the local anesthetic injected can often make your back/ hip/ buttock/ leg feel good for several hours after the injections, the pain will likely return.  It takes 3-7 days for steroids to work in the sacroiliac area.  You may not notice any pain relief for at least that one week.  If  effective, we will often do a series of three injections spaced 3-6 weeks apart to maximally decrease your pain.  After the initial series, we generally will wait some months before a repeat injection of the same type.  If you have any questions, please call [phone] [facility]

## 2015-11-06 NOTE — Progress Notes (Signed)
   Subjective:    Patient ID: [name], female    DOB: 04/23/1946, 70 y.o.   MRN: [id number]  HPI  The patient is a 70 year old female who returns to pain management for further evaluation and treatment of pain involving the neck associated with headaches as well as pain involving the lower back and lower extremity regions. At the present time patient states that she has return of significant lower back and lower extremity pain and is in hopes of being able to undergo interventional treatment to decrease severity of symptoms. We discussed patient's condition and patient stated that the pain is aggravated by standing walking and becomes more intense as the day progresses with pain radiating from the lumbar region toward the buttocks on the left as well as on the right. We will proceed with block of nerves to the sacroiliac joint at time return appointment in attempt to decrease severity of symptoms, minimize progression of symptoms, and avoid the need for more involved treatment. The patient agreed to suggested treatment plan. The patient will continue Topamax and hydrocodone acetaminophen as well as this time. All agreed to suggested treatment plan  Review of Systems     Objective:   Physical Exam  There was tends to palpation of the paraspinal muscular treat the cervical region cervical facet region a mild degree with mild tenderness of the splenius capitis and occipitalis region with palpation over the region of the thoracic region thoracic facet region reproducing mild discomfort. No crepitus of the thoracic region was noted. The patient appeared to be with bilaterally equal grip strength with Tinel and Phalen's maneuver reproducing minimal discomfort. There was tenderness over the PSIS and PII S region of severe degree with lateral bending rotation and palpation of the lumbar facets reproducing moderately severe discomfort. Straight leg raising was tolerated to 30 without increased pain with  dorsiflexion noted. EHL strength appeared to be equal and no sensory deficit of dermatomal distribution was detected. There was mild tenderness along the greater trochanteric region iliotibial band region. Abdomen was nontender with no costovertebral angle tenderness noted      Assessment & Plan:    Sacroiliac joint dysfunction  Degenerative disc disease lumbar spine L4-5 L3-4 degenerative changes predominantly  Degenerative disc disease of the cervical spine  Cervical facet syndrome  Fibromyalgia  Bilateral occipital neuralgia   Migraine headaches     PLAN   Continue present medication Topamax and oxycodone acetaminophen as discussed   Block of nerves to the sacroiliac joint to be performed at time of return appointment  F/U PCP  [facility]-Sie for evaliation of  BP,cardiac condition, weight, and general medical condition  F/U surgical evaluation. May consider pending follow-up evaluations. We will avoid at this time  F/U neurological evaluation. May consider PNCV/EMG studies pending further evaluation as previously discussed  May consider radiofrequency rhizolysis or intraspinal procedures pending response to present treatment and F/U evaluation . We will avoid considering these procedures at this time  Patient to call [facility] Center should patient have concerns prior to scheduled return appointment

## 2015-11-21 ENCOUNTER — Emergency Department: Payer: Medicare Other

## 2015-11-21 ENCOUNTER — Emergency Department
Admission: EM | Admit: 2015-11-21 | Discharge: 2015-11-21 | Disposition: A | Discharge status: Home / Self Care | Payer: Medicare Other | Attending: Emergency Medicine | Admitting: Emergency Medicine

## 2015-11-21 ENCOUNTER — Encounter: Payer: Self-pay | Admitting: Emergency Medicine

## 2015-11-21 DIAGNOSIS — Z87891 Personal history of nicotine dependence: Secondary | ICD-10-CM | POA: Diagnosis not present

## 2015-11-21 DIAGNOSIS — R0602 Shortness of breath: Secondary | ICD-10-CM | POA: Diagnosis not present

## 2015-11-21 DIAGNOSIS — I509 Heart failure, unspecified: Secondary | ICD-10-CM | POA: Diagnosis not present

## 2015-11-21 DIAGNOSIS — Z7982 Long term (current) use of aspirin: Secondary | ICD-10-CM | POA: Insufficient documentation

## 2015-11-21 DIAGNOSIS — R0789 Other chest pain: Secondary | ICD-10-CM | POA: Diagnosis present

## 2015-11-21 DIAGNOSIS — F329 Major depressive disorder, single episode, unspecified: Secondary | ICD-10-CM | POA: Diagnosis not present

## 2015-11-21 DIAGNOSIS — E119 Type 2 diabetes mellitus without complications: Secondary | ICD-10-CM | POA: Diagnosis not present

## 2015-11-21 DIAGNOSIS — Z7984 Long term (current) use of oral hypoglycemic drugs: Secondary | ICD-10-CM | POA: Insufficient documentation

## 2015-11-21 DIAGNOSIS — J441 Chronic obstructive pulmonary disease with (acute) exacerbation: Secondary | ICD-10-CM | POA: Insufficient documentation

## 2015-11-21 DIAGNOSIS — I110 Hypertensive heart disease with heart failure: Secondary | ICD-10-CM | POA: Insufficient documentation

## 2015-11-21 DIAGNOSIS — Z79899 Other long term (current) drug therapy: Secondary | ICD-10-CM | POA: Diagnosis not present

## 2015-11-21 LAB — CBC WITH DIFFERENTIAL/PLATELET
BASOS ABS: 0 10*3/uL (ref 0–0.1)
BASOS PCT: 1 %
Eosinophils Absolute: 0.3 10*3/uL (ref 0–0.7)
Eosinophils Relative: 5 %
HEMATOCRIT: 37 % (ref 35.0–47.0)
Hemoglobin: 12.5 g/dL (ref 12.0–16.0)
Lymphocytes Relative: 40 %
Lymphs Abs: 2 10*3/uL (ref 1.0–3.6)
MCH: 31.9 pg (ref 26.0–34.0)
MCHC: 33.8 g/dL (ref 32.0–36.0)
MCV: 94.3 fL (ref 80.0–100.0)
MONO ABS: 0.6 10*3/uL (ref 0.2–0.9)
Monocytes Relative: 12 %
NEUTROS ABS: 2.1 10*3/uL (ref 1.4–6.5)
NEUTROS PCT: 42 %
Platelets: 144 10*3/uL — ABNORMAL LOW (ref 150–440)
RBC: 3.92 MIL/uL (ref 3.80–5.20)
RDW: 14.8 % — AB (ref 11.5–14.5)
WBC: 4.9 10*3/uL (ref 3.6–11.0)

## 2015-11-21 LAB — COMPREHENSIVE METABOLIC PANEL
ALBUMIN: 3.5 g/dL (ref 3.5–5.0)
ALT: 8 U/L — ABNORMAL LOW (ref 14–54)
AST: 16 U/L (ref 15–41)
Alkaline Phosphatase: 51 U/L (ref 38–126)
Anion gap: 4 — ABNORMAL LOW (ref 5–15)
BILIRUBIN TOTAL: 0.4 mg/dL (ref 0.3–1.2)
BUN: 14 mg/dL (ref 6–20)
CHLORIDE: 110 mmol/L (ref 101–111)
CO2: 24 mmol/L (ref 22–32)
Calcium: 8.7 mg/dL — ABNORMAL LOW (ref 8.9–10.3)
Creatinine, Ser: 0.8 mg/dL (ref 0.44–1.00)
GFR calc Af Amer: >60 mL/min (ref >60)
GFR calc non Af Amer: >60 mL/min (ref >60)
GLUCOSE: 85 mg/dL (ref 65–99)
POTASSIUM: 4 mmol/L (ref 3.5–5.1)
SODIUM: 138 mmol/L (ref 135–145)
TOTAL PROTEIN: 5.9 g/dL — AB (ref 6.5–8.1)

## 2015-11-21 LAB — TROPONIN I: Troponin I: <0.03 ng/mL (ref <0.03)

## 2015-11-21 LAB — BRAIN NATRIURETIC PEPTIDE: B Natriuretic Peptide: 286 pg/mL — ABNORMAL HIGH (ref 0.0–100.0)

## 2015-11-21 LAB — TSH: TSH: 0.01 u[IU]/mL — ABNORMAL LOW (ref 0.350–4.500)

## 2015-11-21 MED ORDER — ASPIRIN 81 MG PO CHEW
CHEWABLE_TABLET | ORAL | Status: AC
  Administered 2015-11-21: 243 mg
  Filled 2015-11-21: qty 3

## 2015-11-21 MED ORDER — AZITHROMYCIN 500 MG PO TABS
500.0000 mg | ORAL_TABLET | Freq: Once | ORAL | Status: AC
  Administered 2015-11-21: 500 mg via ORAL
  Filled 2015-11-21: qty 1

## 2015-11-21 MED ORDER — AZITHROMYCIN 250 MG PO TABS: ORAL_TABLET | ORAL | Status: AC

## 2015-11-21 MED ORDER — ALBUTEROL SULFATE (2.5 MG/3ML) 0.083% IN NEBU
2.5000 mg | INHALATION_SOLUTION | Freq: Once | RESPIRATORY_TRACT | Status: AC
  Administered 2015-11-21: 2.5 mg via RESPIRATORY_TRACT
  Filled 2015-11-21: qty 3

## 2015-11-21 MED ORDER — PREDNISONE 20 MG PO TABS: 40.0000 mg | ORAL_TABLET | Freq: Every day | ORAL | Status: AC

## 2015-11-21 MED ORDER — PREDNISONE 20 MG PO TABS
40.0000 mg | ORAL_TABLET | Freq: Every day | ORAL | Status: DC
  Administered 2015-11-21: 40 mg via ORAL

## 2015-11-21 MED ORDER — IPRATROPIUM-ALBUTEROL 0.5-2.5 (3) MG/3ML IN SOLN
3.0000 mL | Freq: Once | RESPIRATORY_TRACT | Status: AC
  Administered 2015-11-21: 3 mL via RESPIRATORY_TRACT
  Filled 2015-11-21: qty 3

## 2015-11-21 MED ORDER — ASPIRIN 325 MG PO TABS
325.0000 mg | ORAL_TABLET | Freq: Once | ORAL | Status: AC
  Administered 2015-11-21: 325 mg via ORAL
  Filled 2015-11-21: qty 1

## 2015-11-21 MED ORDER — PREDNISONE 20 MG PO TABS
ORAL_TABLET | ORAL | Status: AC
  Administered 2015-11-21: 40 mg via ORAL
  Filled 2015-11-21: qty 2

## 2015-11-21 NOTE — ED Provider Notes (Signed)
[facility] Provider Note  ____________________________________________  Time seen: Approximately 10:27 AM  I have reviewed the triage vital signs and the nursing notes.   HISTORY  Chief Complaint Chest Pain   HPI [name] is a 70 y.o. female for history of unstable angina, CHF, diabetes, hypertension, Graves' disease, COPD who presents for evaluation of chest pain. Patient reports she's been having intermittent episodes of chest pain over the course of 2 weeks. She reports that these episodes happen a few times a week. She describes the pain and substernal, pressure, non radiating, associated with SOB, wheezing, N/V, and dizziness. Chest pain resolves within a few minutes with no intervention. She is unable to determine if the pain happens at rest or with exertion. She reports she had a more severe episode earlier today which prompted her visit. She also endorses a cough that she's had for a week and has been intermittently productive of yellow sputum. She denies fever. Currently she endorses that her chest pain is very mild. She denies any numbness or paresthesias or weaknesses of her extremities or pain radiating to her back. Patient reports that she's never tried nitroglycerin for this pain before. She does endorse having a catheterization a few years back however I'm unable to find these results. Pain is nonpleuritic in nature. She denies palpitations. She also denies any personal or fh history of blood clots, swelling or pain of her lower extremities, or recent travel or immobilization.  Past Medical History  Diagnosis Date  . Sinus problem   . Fibromyalgia   . Chest pain   . CHF (congestive heart failure) (HCC)   . IBS (irritable bowel syndrome)   . Depression   . Anxiety   . Diabetes mellitus without complication (HCC)   . Spinal stenosis   . PTSD (post-traumatic stress disorder)   . Hypertension   . Graves disease      Patient Active Problem List   Diagnosis Date Noted  . Unstable angina (HCC) 06/13/2015  . UTI (lower urinary tract infection) 06/13/2015  . Degenerative disc disease, lumbar 10/18/2014  . Sacroiliac joint dysfunction 10/18/2014  . Facet syndrome, lumbar 10/18/2014  . Cervical facet joint syndrome 10/18/2014  . Bilateral occipital neuralgia 10/18/2014  . Migraine 10/18/2014  . Neurosis, posttraumatic 10/08/2014  . Spinal stenosis   . PTSD (post-traumatic stress disorder)     Past Surgical History  Procedure Laterality Date  . Tonsillectomy and adenoidectomy    . Toenail removal Bilateral     great toes  . Abdominal hysterectomy    . Rectal surgery    . Esophagogastroduodenoscopy (egd) with propofol N/A 01/10/2015    Procedure: ESOPHAGOGASTRODUODENOSCOPY (EGD) WITH PROPOFOL;  Surgeon: Wallace Cullens, MD;  Location: [facility] ENDOSCOPY;  Service: Gastroenterology;  Laterality: N/A;  . Savory dilation N/A 01/10/2015    Procedure: SAVORY DILATION;  Surgeon: Wallace Cullens, MD;  Location: [facility] ENDOSCOPY;  Service: Gastroenterology;  Laterality: N/A;    Current Outpatient Rx  Name  Route  Sig  Dispense  Refill  . ADVAIR DISKUS 250-50 MCG/DOSE AEPB   Inhalation   Inhale 1 puff into the lungs 2 (two) times daily. Reported on 08/07/2015           Dispense as written.   Marland Kitchen aspirin 81 MG tablet   Oral   Take 81 mg by mouth daily.         . cetirizine (ZYRTEC) 10 MG tablet   Oral   Take  10 mg by mouth daily.         . clonazePAM (KLONOPIN) 1 MG tablet   Oral   Take 1 tablet (1 mg total) by mouth 3 (three) times daily. Take 1 tablet 3 times daily as needed   90 tablet   5   . CRESTOR 10 MG tablet   Oral   Take 10 mg by mouth daily.            Dispense as written.   Marland Kitchen FLUoxetine (PROZAC) 20 MG capsule   Oral   Take 3 capsules (60 mg total) by mouth daily.   90 capsule   6   . fluticasone (FLONASE) 50 MCG/ACT nasal spray   Each Nare   Place into both nostrils daily.          . hydrocortisone (ANUSOL-HC) 2.5 % rectal cream   Rectal   Place 1 application rectally 3 (three) times daily as needed for hemorrhoids or itching.         . hyoscyamine (LEVSIN SL) 0.125 MG SL tablet      TAKE ONE TABLET BY MOUTH EVERY 4 HOURS AS NEEDED FOR CRAMPING         . levothyroxine (SYNTHROID, LEVOTHROID) 125 MCG tablet   Oral   Take 125 mcg by mouth daily before breakfast.         . loperamide (IMODIUM) 2 MG capsule   Oral   Take by mouth as needed for diarrhea or loose stools. prn         . metFORMIN (GLUCOPHAGE) 500 MG tablet   Oral   Take 500 mg by mouth daily with breakfast.          . metoprolol tartrate (LOPRESSOR) 25 MG tablet   Oral   Take 25 mg by mouth 2 (two) times daily.          Marland Kitchen oxyCODONE-acetaminophen (PERCOCET) 7.5-325 MG tablet      Limit 1 tablet by mouth 4 times per day if tolerated   120 tablet   0   . pantoprazole (PROTONIX) 40 MG tablet   Oral   Take 40 mg by mouth daily.          . polyethylene glycol powder (GLYCOLAX/MIRALAX) powder      as needed. Reported on 11/06/2015         . tiotropium (SPIRIVA) 18 MCG inhalation capsule   Inhalation   Place 18 mcg into inhaler and inhale daily. Reported on 08/07/2015         . topiramate (TOPAMAX) 25 MG tablet      Limit 1 tab  by mouth per day if tolerated   30 tablet   0   . azithromycin (ZITHROMAX Z-PAK) 250 MG tablet      1 tab a day for 4 days   4 each   0   . predniSONE (DELTASONE) 20 MG tablet   Oral   Take 2 tablets (40 mg total) by mouth daily.   8 tablet   0     Allergies Celecoxib and Pregabalin  Family History  Problem Relation Age of Onset  . Alcohol abuse Mother   . Depression Mother   . Varicose Veins Mother   . Alcohol abuse Father   . Depression Father   . Alcohol abuse Sister   . Asthma Sister   . COPD Sister   . Depression Sister   . Heart disease Sister   . Hypertension Sister   .  Depression Maternal Grandmother   .  Diabetes Paternal Grandmother   . Stroke Paternal Grandmother     Social History Social History  Substance Use Topics  . Smoking status: Former Smoker -- 25 years    Types: Cigarettes    Quit date: 12/17/2011  . Smokeless tobacco: Never Used  . Alcohol Use: No    Review of Systems  Constitutional: Negative for fever. Eyes: Negative for visual changes. ENT: Negative for sore throat. Cardiovascular: + chest pain. Respiratory: + shortness of breath, cough, wheezing Gastrointestinal: Negative for abdominal pain, vomiting or diarrhea. Genitourinary: Negative for dysuria. Musculoskeletal: Negative for back pain. Skin: Negative for rash. Neurological: Negative for headaches, weakness or numbness. + Dizziness  ____________________________________________   PHYSICAL EXAM:  VITAL SIGNS: ED Triage Vitals  Enc Vitals Group     BP 11/21/15 1024 155/80 mmHg     Pulse Rate 11/21/15 1024 53     Resp 11/21/15 1024 21     Temp 11/21/15 1024 98 F (36.7 C)     Temp Source 11/21/15 1024 Oral     SpO2 11/21/15 1024 95 %     Weight 11/21/15 1024 118 lb (53.524 kg)     Height 11/21/15 1024 5\' 6"  (1.676 m)     Head Cir --      Peak Flow --      Pain Score 11/21/15 1025 0     Pain Loc --      Pain Edu? --      Excl. in GC? --     Constitutional: Alert and oriented. Well appearing and in no apparent distress. HEENT:      Head: Normocephalic and atraumatic.         Eyes: Conjunctivae are normal. Sclera is non-icteric. EOMI. PERRL      Mouth/Throat: Mucous membranes are moist.       Neck: Supple with no signs of meningismus. Cardiovascular: Regular rate and rhythm. No murmurs, gallops, or rubs. 2+ symmetrical distal pulses are present in all extremities. No JVD. Respiratory: Mildly tachypneic but speaking in full sentences. Lungs are clear to auscultation bilaterally with diminished air movement. Patient has expiratory wheezes on bilateral lungs but no crackles.  Gastrointestinal:  Soft, non tender, and non distended with positive bowel sounds. No rebound or guarding. Musculoskeletal: Nontender with normal range of motion in all extremities. No edema, cyanosis, or erythema of extremities. Neurologic: Normal speech and language. Face is symmetric. Moving all extremities. No gross focal neurologic deficits are appreciated. Skin: Skin is warm, dry and intact. No rash noted. Psychiatric: Mood and affect are normal. Speech and behavior are normal.  ____________________________________________   LABS (all labs ordered are listed, but only abnormal results are displayed)  Labs Reviewed  CBC WITH DIFFERENTIAL/PLATELET - Abnormal; Notable for the following:    RDW 14.8 (*)    Platelets 144 (*)    All other components within normal limits  COMPREHENSIVE METABOLIC PANEL - Abnormal; Notable for the following:    Calcium 8.7 (*)    Total Protein 5.9 (*)    ALT 8 (*)    Anion gap 4 (*)    All other components within normal limits  BRAIN NATRIURETIC PEPTIDE - Abnormal; Notable for the following:    B Natriuretic Peptide 286.0 (*)    All other components within normal limits  TSH - Abnormal; Notable for the following:    TSH 0.010 (*)    All other components within normal limits  TROPONIN I  TROPONIN  I   ____________________________________________  EKG  Sinus rhythm, rate of 54, normal intervals, normal axis, no ST elevations or depressions, T-wave flattening in aVL. Unchanged from prior ____________________________________________  RADIOLOGY  CXR: Hyperinflated lungs, no evidence of CHF or pneumonia.   ____________________________________________   PROCEDURES  Procedure(s) performed: None Critical Care performed:  None ____________________________________________   INITIAL IMPRESSION / ASSESSMENT AND PLAN / ED COURSE  70 y.o. female for history of unstable angina, CHF, diabetes, hypertension, Graves' disease, COPD who presents for evaluation of  intermittent episodes of chest pain over the course of the last 2 weeks with a more severe one today. Episodes  Are associated with coughing, wheezing, nausea, and dizziness. On exam she is well appearing, in no distress, vital signs are within normal limits, patient has diffuse expiratory wheezes throughout with decreased air movement and mildly increased WOB, remainder physical exam is unremarkable. Differential diagnoses including COPD exacerbation versus ACS vs pneumonia. Plan for EKG, labs, chest x-ray, will watch patient on telemetry. We'll give patient aspirin.  ----------------------------------------- ED course: -----------------------------------------  Chest x-ray concerning for COPD exacerbation, troponin is negative, EKG nonischemic. Patient reports resolution of her chest pain after one DuoNeb treatment. Her wheezing has resolved although she still not moving great air. She received a second duoneb treatment and responded really well. Patient remained stable with no further CP or wheezing. She was started on prednisone burst and azithromycin. She was discharged home in stable conditions.  Pertinent labs & imaging results that were available during my care of the patient were reviewed by me and considered in my medical decision making (see chart for details).  _________________________ 6:26 PM on 11/21/2015 -----------------------------------------  Patient left ED without her prescriptions. I spoke to her nurse Annabelle Harmanana at her SNF and prescriptions were sent to patient's pharmacy. Annabelle HarmanDana confirmed that the pharmacy will deliver these for her tonight at the SNF.  ____________________________________________   FINAL CLINICAL IMPRESSION(S) / ED DIAGNOSES  Final diagnoses:  COPD exacerbation (HCC)      NEW MEDICATIONS STARTED DURING THIS VISIT:  Discharge Medication List as of 11/21/2015  3:43 PM       Note:  This document was prepared using Dragon voice recognition software and  may include unintentional dictation errors.    Nita Sicklearolina Shawndell Schillaci, MD 11/21/15 [phone]

## 2015-11-21 NOTE — ED Notes (Signed)
Patient transported to X-ray 

## 2015-11-21 NOTE — Discharge Instructions (Signed)

## 2015-11-21 NOTE — ED Notes (Signed)
EMS states pt complains of left sided chest pain that has been intermittent over last two weeks. Pt complains of nausea and dizziness with pain. Pt has productive cough noted during triage.

## 2015-11-21 NOTE — ED Notes (Signed)
Called the [facility] to find out about transportation for the patient.  Their transportation is occupied and they have asked the patient to be sent back by EMS.  Bishop is there person of contact for [employer] [facility] [employer] [place]. Secretary notified and EMS called for transportation.

## 2015-11-25 ENCOUNTER — Ambulatory Visit: Payer: Medicare Other | Admitting: Pain Medicine

## 2015-11-25 ENCOUNTER — Telehealth: Payer: Self-pay | Admitting: Pain Medicine

## 2015-11-25 NOTE — Telephone Encounter (Signed)
Patient's transportation could not bring her today and she has to reschedule. Her new appt is 12-02-15

## 2015-11-25 NOTE — Telephone Encounter (Signed)
Thank you very much 

## 2015-12-02 ENCOUNTER — Ambulatory Visit: Payer: Medicare Other | Attending: Pain Medicine | Admitting: Pain Medicine

## 2015-12-02 ENCOUNTER — Encounter: Payer: Self-pay | Admitting: Pain Medicine

## 2015-12-02 VITALS — BP 116/58 | HR 55 | Temp 98.6°F | Resp 15 | Ht 66.0 in | Wt 118.0 lb

## 2015-12-02 DIAGNOSIS — M5481 Occipital neuralgia: Secondary | ICD-10-CM

## 2015-12-02 DIAGNOSIS — M5136 Other intervertebral disc degeneration, lumbar region: Secondary | ICD-10-CM | POA: Insufficient documentation

## 2015-12-02 DIAGNOSIS — M79606 Pain in leg, unspecified: Secondary | ICD-10-CM | POA: Diagnosis present

## 2015-12-02 DIAGNOSIS — M791 Myalgia: Secondary | ICD-10-CM | POA: Diagnosis present

## 2015-12-02 DIAGNOSIS — M47816 Spondylosis without myelopathy or radiculopathy, lumbar region: Secondary | ICD-10-CM | POA: Diagnosis not present

## 2015-12-02 DIAGNOSIS — F79 Unspecified intellectual disabilities: Secondary | ICD-10-CM

## 2015-12-02 DIAGNOSIS — M48061 Spinal stenosis, lumbar region without neurogenic claudication: Secondary | ICD-10-CM

## 2015-12-02 DIAGNOSIS — G43109 Migraine with aura, not intractable, without status migrainosus: Secondary | ICD-10-CM

## 2015-12-02 DIAGNOSIS — M545 Low back pain: Secondary | ICD-10-CM | POA: Diagnosis present

## 2015-12-02 DIAGNOSIS — M47812 Spondylosis without myelopathy or radiculopathy, cervical region: Secondary | ICD-10-CM

## 2015-12-02 DIAGNOSIS — M4804 Spinal stenosis, thoracic region: Secondary | ICD-10-CM

## 2015-12-02 DIAGNOSIS — M533 Sacrococcygeal disorders, not elsewhere classified: Secondary | ICD-10-CM

## 2015-12-02 MED ORDER — FENTANYL CITRATE (PF) 100 MCG/2ML IJ SOLN
100.0000 ug | Freq: Once | INTRAMUSCULAR | Status: AC
  Administered 2015-12-02: 100 ug via INTRAVENOUS
  Filled 2015-12-02: qty 2

## 2015-12-02 MED ORDER — TRIAMCINOLONE ACETONIDE 40 MG/ML IJ SUSP
40.0000 mg | Freq: Once | INTRAMUSCULAR | Status: AC
  Administered 2015-12-02: 40 mg
  Filled 2015-12-02: qty 1

## 2015-12-02 MED ORDER — BUPIVACAINE HCL (PF) 0.25 % IJ SOLN
30.0000 mL | Freq: Once | INTRAMUSCULAR | Status: AC
  Administered 2015-12-02: 30 mL
  Filled 2015-12-02: qty 30

## 2015-12-02 MED ORDER — LACTATED RINGERS IV SOLN
1000.0000 mL | INTRAVENOUS | Status: DC
  Administered 2015-12-02: 1000 mL via INTRAVENOUS

## 2015-12-02 MED ORDER — ORPHENADRINE CITRATE 30 MG/ML IJ SOLN
60.0000 mg | Freq: Once | INTRAMUSCULAR | Status: AC
  Administered 2015-12-02: 60 mg via INTRAMUSCULAR
  Filled 2015-12-02: qty 2

## 2015-12-02 MED ORDER — MIDAZOLAM HCL 5 MG/5ML IJ SOLN
5.0000 mg | Freq: Once | INTRAMUSCULAR | Status: AC
  Administered 2015-12-02: 2 mg via INTRAVENOUS
  Filled 2015-12-02: qty 5

## 2015-12-02 MED ORDER — OXYCODONE-ACETAMINOPHEN 7.5-325 MG PO TABS: ORAL_TABLET | ORAL | Status: DC

## 2015-12-02 MED ORDER — TOPIRAMATE 25 MG PO TABS: ORAL_TABLET | ORAL | Status: DC

## 2015-12-02 NOTE — Progress Notes (Signed)
Subjective:    Patient ID: [name], female    DOB: 05-Dec-1945, 70 y.o.   MRN: [id number]  HPI  PROCEDURE:  Block of nerves to the sacroiliac joint.   NOTE:  The patient is a 70 y.o. female who returns to the [facility] for further evaluation and treatment of pain involving the lower back and lower extremity region with pain in the region of the buttocks as well. Prior MRI studies reveal degenerative disc disease lumbar spine L4-5 L3-4 degenerative changes predominantly.   the patient is with reproduction of severe pain with palpation over the PSIS and PII S regions. There is concern regarding a significant component of the patient's pain being due to sacroiliac joint dysfunction The risks, benefits, expectations of the procedure have been discussed and explained to the patient who is understanding and willing to proceed with interventional treatment in attempt to decrease severity of patient's symptoms, minimize the risk of medication escalation and  hopefully retard the progression of the patient's symptoms. We will proceed with what is felt to be a medically necessary procedure, block of nerves to the sacroiliac joint.   DESCRIPTION OF PROCEDURE:  Block of nerves to the sacroiliac joint.   The patient was taken to the fluoroscopy suite. With the patient in the prone position with EKG, blood pressure, pulse, capnography, and pulse oximetry monitoring, IV Versed, IV fentanyl conscious sedation, Betadine prep of proposed entry site was performed.   Block of nerves at the L5 vertebral body level.   With the patient in prone position, under fluoroscopic guidance, a 22 -gauge needle was inserted at the L5 vertebral body level on the Left side. With 15 degrees oblique orientation a 22 -gauge needle was inserted in the region known as Burton's eye or eye of the Scotty dog. Following documentation of needle placement in the area of Burton's eye or eye of the Scotty dog under  fluoroscopic guidance, needle placement was then accomplished at the sacral ala level on the left side.   Needle placement at the sacral ala.   With the patient in prone position under fluoroscopic guidance with AP view of the lumbosacral spine, a 22 -gauge needle was inserted in the region known as the sacral ala on the left side. Following documentation of needle placement on the left side under fluoroscopic guidance needle placement was then accomplished at the S1 foramen level.   Needle placement at the S1 foramen level.   With the patient in prone position under fluoroscopic guidance with AP view of the lumbosacral spine and cephalad orientation, a 22 -gauge needle was inserted at the superior and lateral border of the S1 foramen on the left side. Following documentation of needle placement at the S1 foramen level on the left side, needle placement was then accomplished at the S2 foramen level on the left side.   Needle placement at the S2 foramen level.   With the patient in prone position with AP view of the lumbosacral spine with cephalad orientation, a 22 - gauge needle was inserted at the superior and lateral border of the S2 foramen under fluoroscopic guidance on the left side. Following needle placement at the L5 vertebral body level, sacral ala, S1 foramen and S2 foramen on the left  side, needle placement was verified on lateral view under fluoroscopic guidance.  Following needle placement documentation on lateral view, each needle was injected with 1 mL of 0.25% bupivacaine and Kenalog.   BLOCK OF THE NERVES TO  SACROILIAC JOINT ON THE RIGHT SIDE The procedure was performed on the right side at the same levels as was performed on the left side and utilizing the same technique as on the left side and was performed under fluoroscopic guidance as on the left side   A total of 10mg  of Kenalog was utilized for the procedure.   PLAN:  1. Medications: The patient will continue presently  prescribed medications Topamax and oxycodone  2. The patient will be considered for modification of treatment regimen pending response to the procedure performed on today's visit.  3. The patient is to follow-up with primary care physician  Ellsworth Lennoxejan - Sie for evaluation of blood pressure and general medical condition following the procedure performed on today's visit.  4. Surgical evaluation as discussed.  5. Neurological evaluation as discussed.  6. Appointment with Dr.Clapacs for psych evaluation as discussed  7. The patient may be a candidate for radiofrequency procedures, implantation devices and other treatment pending response to treatment performed on today's visit and follow-up evaluation.  8. The patient has been advised to adhere to proper body mechanics and to avoid activities which may exacerbate the patient's symptoms.   Return appointment to [facility] Management Center as scheduled.    Review of Systems     Objective:   Physical Exam        Assessment & Plan:

## 2015-12-02 NOTE — Patient Instructions (Addendum)
PLAN   Continue present medication Topamax and oxycodone acetaminophen as discussed   F/U PCP  Tejan-Sie for evaliation of  BP,cardiac condition, weight and general medical condition  F/U surgical evaluation. May consider pending follow-up evaluations. We will avoid at this time  F/U Dr Toni Amendlapacs for evaluation  F/U neurological evaluation. May consider PNCV/EMG studies pending further evaluation as previously discussed  May consider radiofrequency rhizolysis or intraspinal procedures pending response to present treatment and F/U evaluation . We will avoid considering these procedures at this time  Patient to call Pain Management Center should patient have concerns prior to scheduled return appointmentPain Management Discharge Instructions  General Discharge Instructions :  If you need to reach your doctor call: Monday-Friday 8:00 am - 4:00 pm at [phone] or toll free [phone].  After clinic hours [phone] to have operator reach doctor.  Bring all of your medication bottles to all your appointments in the pain clinic.  To cancel or reschedule your appointment with Pain Management please remember to call 24 hours in advance to avoid a fee.  Refer to the educational materials which you have been given on: General Risks, I had my Procedure. Discharge Instructions, Post Sedation.  Post Procedure Instructions:  The drugs you were given will stay in your system until tomorrow, so for the next 24 hours you should not drive, make any legal decisions or drink any alcoholic beverages.  You may eat anything you prefer, but it is better to start with liquids then soups and crackers, and gradually work up to solid foods.  Please notify your doctor immediately if you have any unusual bleeding, trouble breathing or pain that is not related to your normal pain.  Depending on the type of procedure that was done, some parts of your body may feel week and/or numb.  This usually clears up  by tonight or the next day.  Walk with the use of an assistive device or accompanied by an adult for the 24 hours.  You may use ice on the affected area for the first 24 hours.  Put ice in a Ziploc bag and cover with a towel and place against area 15 minutes on 15 minutes off.  You may switch to heat after 24 hours.

## 2015-12-02 NOTE — Progress Notes (Signed)
Patient here for procedure d/t lower back pain.  Patient is dressed in her night gown and housecoat and bedroom slippers.  Patient having difficulty verbalizing her thoughts today.  Safety precautions to be maintained throughout the outpatient stay will include: orient to surroundings, keep bed in low position, maintain call bell within reach at all times, provide assistance with transfer out of bed and ambulation.

## 2015-12-03 ENCOUNTER — Telehealth: Payer: Self-pay | Admitting: *Deleted

## 2015-12-03 NOTE — Telephone Encounter (Signed)
Voicemail left with patient to call our office if she has questions or concerns re; procedure on yesterday.  

## 2015-12-05 ENCOUNTER — Other Ambulatory Visit: Payer: Self-pay | Admitting: Pain Medicine

## 2015-12-05 DIAGNOSIS — M5136 Other intervertebral disc degeneration, lumbar region: Secondary | ICD-10-CM

## 2015-12-05 DIAGNOSIS — F4310 Post-traumatic stress disorder, unspecified: Secondary | ICD-10-CM

## 2015-12-05 DIAGNOSIS — G43109 Migraine with aura, not intractable, without status migrainosus: Secondary | ICD-10-CM

## 2015-12-05 DIAGNOSIS — M533 Sacrococcygeal disorders, not elsewhere classified: Secondary | ICD-10-CM

## 2015-12-05 DIAGNOSIS — M5481 Occipital neuralgia: Secondary | ICD-10-CM

## 2015-12-05 DIAGNOSIS — M48061 Spinal stenosis, lumbar region without neurogenic claudication: Secondary | ICD-10-CM

## 2015-12-05 DIAGNOSIS — M4804 Spinal stenosis, thoracic region: Secondary | ICD-10-CM

## 2015-12-05 DIAGNOSIS — M47816 Spondylosis without myelopathy or radiculopathy, lumbar region: Secondary | ICD-10-CM

## 2015-12-05 DIAGNOSIS — F79 Unspecified intellectual disabilities: Secondary | ICD-10-CM

## 2015-12-05 DIAGNOSIS — M47812 Spondylosis without myelopathy or radiculopathy, cervical region: Secondary | ICD-10-CM

## 2015-12-19 ENCOUNTER — Ambulatory Visit (INDEPENDENT_AMBULATORY_CARE_PROVIDER_SITE_OTHER): Payer: Medicare Other | Admitting: Psychiatry

## 2015-12-19 DIAGNOSIS — F322 Major depressive disorder, single episode, severe without psychotic features: Secondary | ICD-10-CM

## 2015-12-19 DIAGNOSIS — F4323 Adjustment disorder with mixed anxiety and depressed mood: Secondary | ICD-10-CM

## 2015-12-19 DIAGNOSIS — F3181 Bipolar II disorder: Secondary | ICD-10-CM | POA: Diagnosis not present

## 2015-12-19 MED ORDER — FLUOXETINE HCL 20 MG PO CAPS: 60.0000 mg | ORAL_CAPSULE | Freq: Every day | ORAL | 6 refills | Status: DC

## 2015-12-19 MED ORDER — ARIPIPRAZOLE 5 MG PO TABS: 5.0000 mg | ORAL_TABLET | Freq: Every day | ORAL | 3 refills | Status: DC

## 2015-12-19 MED ORDER — CLONAZEPAM 1 MG PO TABS: 1.0000 mg | ORAL_TABLET | Freq: Every day | ORAL | 5 refills | Status: DC

## 2015-12-19 NOTE — Progress Notes (Signed)
Follow-up for 70 year old woman with history of depression and mood instability and anxiety. She is having a little harder time adjusting to her new living situation. Over the last month she has been more withdrawn, spending more time in bed, feeling more negative about herself. Has had some passive suicidal thoughts but absolutely denies any intent or plan to harm herself. Does not appear to be having any psychotic symptoms. In response to this it looks like the doctor at the [facility] has made multiple medicine adjustments as of 4 days ago. Her antidepressants were dramatically changed, Klonopin dose was decreased, pain medicine dose was decreased, muscle relaxer dose decreased.  Patient is neatly dressed alert and oriented. Affect flat. Less talkative than before. Denies suicidal intent or plan although feels hopeless and negative about herself. Doesn't report any obvious psychotic symptoms. Patient is showing reasonably good insight and judgment.  We discussed what a difficult time it is for her to transition away from living with family. Strongly support her efforts to get up out of bed stay active do things during the day. I don't think it's a good idea to switch her antidepressant as dramatically as was done. I'm going to discontinue the Zoloft and have her restart Prozac at 60 mg a day. Additionally I would like to add Abilify to boost the antidepressant effect. 2.5 mg a day for 1 week followed by 5 mg a day thereafter. Patient is to return to see me in about a month. She and daughter agree to plan.

## 2015-12-31 ENCOUNTER — Telehealth: Payer: Self-pay | Admitting: Pain Medicine

## 2015-12-31 ENCOUNTER — Ambulatory Visit: Payer: Medicare Other | Admitting: Pain Medicine

## 2015-12-31 NOTE — Telephone Encounter (Signed)
Called patient about missed appt. She is in assisted living and they did not come to take her to appt and she forgot about it. I resched her to Tue 01-07-16 at 10:30 Patient stated she was going to assisted living about it when we got off phone

## 2015-12-31 NOTE — Telephone Encounter (Signed)
Thank you :)

## 2016-01-07 ENCOUNTER — Ambulatory Visit: Payer: Medicare Other | Attending: Pain Medicine | Admitting: Pain Medicine

## 2016-01-07 ENCOUNTER — Encounter: Payer: Self-pay | Admitting: Pain Medicine

## 2016-01-07 VITALS — BP 123/52 | HR 73 | Temp 98.6°F | Resp 15 | Ht 66.0 in | Wt 133.0 lb

## 2016-01-07 DIAGNOSIS — M5136 Other intervertebral disc degeneration, lumbar region: Secondary | ICD-10-CM | POA: Diagnosis not present

## 2016-01-07 DIAGNOSIS — M797 Fibromyalgia: Secondary | ICD-10-CM | POA: Insufficient documentation

## 2016-01-07 DIAGNOSIS — G43909 Migraine, unspecified, not intractable, without status migrainosus: Secondary | ICD-10-CM | POA: Diagnosis not present

## 2016-01-07 DIAGNOSIS — M542 Cervicalgia: Secondary | ICD-10-CM | POA: Diagnosis present

## 2016-01-07 DIAGNOSIS — M47812 Spondylosis without myelopathy or radiculopathy, cervical region: Secondary | ICD-10-CM

## 2016-01-07 DIAGNOSIS — M47816 Spondylosis without myelopathy or radiculopathy, lumbar region: Secondary | ICD-10-CM | POA: Insufficient documentation

## 2016-01-07 DIAGNOSIS — M5030 Other cervical disc degeneration, unspecified cervical region: Secondary | ICD-10-CM | POA: Insufficient documentation

## 2016-01-07 DIAGNOSIS — M533 Sacrococcygeal disorders, not elsewhere classified: Secondary | ICD-10-CM | POA: Diagnosis not present

## 2016-01-07 DIAGNOSIS — M62830 Muscle spasm of back: Secondary | ICD-10-CM | POA: Diagnosis not present

## 2016-01-07 DIAGNOSIS — G43009 Migraine without aura, not intractable, without status migrainosus: Secondary | ICD-10-CM

## 2016-01-07 DIAGNOSIS — M5481 Occipital neuralgia: Secondary | ICD-10-CM | POA: Insufficient documentation

## 2016-01-07 DIAGNOSIS — R51 Headache: Secondary | ICD-10-CM | POA: Diagnosis present

## 2016-01-07 MED ORDER — OXYCODONE-ACETAMINOPHEN 7.5-325 MG PO TABS: ORAL_TABLET | ORAL | 0 refills | Status: DC

## 2016-01-07 MED ORDER — TOPIRAMATE 25 MG PO TABS: ORAL_TABLET | ORAL | 0 refills | Status: DC

## 2016-01-07 NOTE — Patient Instructions (Signed)
PLAN   Continue present medication Topamax and oxycodone acetaminophen as discussed   F/U PCP Dr.Tejan-Sie for evaluation of  BP,cardiac condition, weight, and general medical condition  F/U surgical evaluation. May consider pending follow-up evaluations. We will avoid at this time  F/U neurological evaluation. May consider PNCV/EMG studies pending further evaluation as previously discussed  May consider radiofrequency rhizolysis or intraspinal procedures pending response to present treatment and F/U evaluation . We will avoid considering these procedures at this time  Patient to call [facility] Management Center should patient have concerns prior to scheduled return appointment

## 2016-01-07 NOTE — Progress Notes (Signed)
Pt's feet are swollen

## 2016-01-07 NOTE — Progress Notes (Signed)
Safety precautions to be maintained throughout the outpatient stay will include: orient to surroundings, keep bed in low position, maintain call bell within reach at all times, provide assistance with transfer out of bed and ambulation.  

## 2016-01-07 NOTE — Progress Notes (Signed)
     The patient is a 70 year old female who returns to pain management for further evaluation and treatment of pain involving the neck associated with headaches as well as pain involving the lower back and lower extremity regions. The patient is undergone prior interventional treatment consisting of block of nerves to the sacroiliac joint and is with continued relief of pain. The patient denies any significant headaches as well. The patient is tolerating medication Topamax and oxycodone acetaminophen without undesirable side effects. We discussed patient's condition on today's visit and we will continue presently prescribed medications. Patient is able to perform most activities of daily living without expansion in severely disabling pain. The patient denies any recent trauma change in events of daily living the call significant change in symptomatology. All were understanding and in agreement with suggested treatment plan.      Physical examination  There was tenderness to palpation of the paraspinal muscular treat the cervical region cervical facet region palpation which reproduces pain of minimal degree there was minimal tenderness of the splenius capitis and occipitalis region. Palpation of the thoracic region thoracic facet region was with evidence of mild tenderness to palpation with mild muscle spasms noted with palpation of the trapezius levator scapula and rhomboid musculature regions reproducing mild discomfort with mild muscle spasm. There appeared to be unremarkable Spurling's maneuver and patient was able to perform drop test with minimal difficulty. There was bilateral equal grip strength without significant increase of pain with Tinel and Phalen's maneuver. Palpation over the lumbar paraspinal muscular treat and lumbar facet region was with mild discomfort. Lateral bending rotation extension and palpation of the lumbar facets reproduce mild stump. There was mild tenderness of the PSIS and  PII S region as well as as mild tenderness over the greater trochanteric region iliotibial band region. There was no significant increase of pain with pressure applied to the ileum with patient in lateral decubitus position. A leg raising was tolerated to possibly 30 without increased pain with dorsiflexion noted. DTRs appeared to be trace at the knees. There was mild tenderness to palpation of the knees with mild crepitus of the knees with negative anterior and posterior drawer signs. There was negative clonus negative Homans Abdomen was nontender with no costovertebral tenderness noted.     Assessment   Sacroiliac joint dysfunction  Degenerative disc disease lumbar spine L4-5 L3-4 degenerative changes predominantly  Degenerative disc disease of the cervical spine  Cervical facet syndrome  Fibromyalgia  Bilateral occipital neuralgia   Migraine headaches       PLAN   Continue present medication Topamax and oxycodone acetaminophen as discussed   F/U PCP Dr.Tejan-Sie for evaluation of  BP,cardiac condition, weight, and general medical condition  F/U surgical evaluation. May consider pending follow-up evaluations. We will avoid at this time  F/U neurological evaluation. May consider PNCV/EMG studies pending further evaluation as previously discussed  May consider radiofrequency rhizolysis or intraspinal procedures pending response to present treatment and F/U evaluation . We will avoid considering these procedures at this time  Patient to call [facility] should patient have concerns prior to scheduled return appointment

## 2016-01-16 ENCOUNTER — Encounter: Payer: Self-pay | Admitting: Psychiatry

## 2016-01-16 ENCOUNTER — Ambulatory Visit (INDEPENDENT_AMBULATORY_CARE_PROVIDER_SITE_OTHER): Payer: Medicare Other | Admitting: Psychiatry

## 2016-01-16 VITALS — BP 125/76 | HR 89 | Temp 97.6°F | Ht 66.0 in | Wt 133.4 lb

## 2016-01-16 DIAGNOSIS — F4323 Adjustment disorder with mixed anxiety and depressed mood: Secondary | ICD-10-CM

## 2016-01-16 DIAGNOSIS — F322 Major depressive disorder, single episode, severe without psychotic features: Secondary | ICD-10-CM

## 2016-01-16 DIAGNOSIS — F3181 Bipolar II disorder: Secondary | ICD-10-CM | POA: Diagnosis not present

## 2016-01-16 MED ORDER — SERTRALINE HCL 100 MG PO TABS: 100.0000 mg | ORAL_TABLET | Freq: Every day | ORAL | 5 refills | Status: DC

## 2016-01-16 NOTE — Progress Notes (Signed)
Follow-up 70 year old woman with a history of anxiety and depression. She is starting to get adjusted to her nursing home care. Affect is good. Mood is reported as better. Sleeping is okay. She does have some medical problems and has been getting appropriate care for those. She is frustrated that they have resisted making the changes to medicine I have suggested. She is not getting the Prozac but is back on Zoloft.  Neatly dressed and groomed woman. Calm affect. Good eye contact. Thoughts lucid. No suicidal thinking. No evidence of psychosis.  Patient would like to try increasing her antidepressant. Increase Zoloft to 100 mg. Continue Klonopin at night only. Continue low-dose Abilify 5 mg follow-up 6 weeks.

## 2016-01-17 ENCOUNTER — Emergency Department
Admission: EM | Admit: 2016-01-17 | Discharge: 2016-01-17 | Disposition: A | Discharge status: Home / Self Care | Payer: Medicare Other | Attending: Emergency Medicine | Admitting: Emergency Medicine

## 2016-01-17 ENCOUNTER — Encounter: Payer: Self-pay | Admitting: Emergency Medicine

## 2016-01-17 DIAGNOSIS — Z87891 Personal history of nicotine dependence: Secondary | ICD-10-CM | POA: Insufficient documentation

## 2016-01-17 DIAGNOSIS — I110 Hypertensive heart disease with heart failure: Secondary | ICD-10-CM | POA: Diagnosis not present

## 2016-01-17 DIAGNOSIS — R609 Edema, unspecified: Secondary | ICD-10-CM

## 2016-01-17 DIAGNOSIS — R600 Localized edema: Secondary | ICD-10-CM | POA: Diagnosis not present

## 2016-01-17 DIAGNOSIS — Z79899 Other long term (current) drug therapy: Secondary | ICD-10-CM | POA: Diagnosis not present

## 2016-01-17 DIAGNOSIS — Z7984 Long term (current) use of oral hypoglycemic drugs: Secondary | ICD-10-CM | POA: Diagnosis not present

## 2016-01-17 DIAGNOSIS — E1165 Type 2 diabetes mellitus with hyperglycemia: Secondary | ICD-10-CM | POA: Diagnosis present

## 2016-01-17 DIAGNOSIS — I509 Heart failure, unspecified: Secondary | ICD-10-CM | POA: Insufficient documentation

## 2016-01-17 DIAGNOSIS — Z7982 Long term (current) use of aspirin: Secondary | ICD-10-CM | POA: Insufficient documentation

## 2016-01-17 LAB — URINALYSIS COMPLETE WITH MICROSCOPIC (ARMC ONLY)
BILIRUBIN URINE: NEGATIVE
Glucose, UA: NEGATIVE mg/dL
Ketones, ur: NEGATIVE mg/dL
Leukocytes, UA: NEGATIVE
Nitrite: POSITIVE — AB
PH: 6 (ref 5.0–8.0)
PROTEIN: NEGATIVE mg/dL
Specific Gravity, Urine: 1.013 (ref 1.005–1.030)

## 2016-01-17 LAB — BASIC METABOLIC PANEL
ANION GAP: 5 (ref 5–15)
BUN: 15 mg/dL (ref 6–20)
CALCIUM: 8.9 mg/dL (ref 8.9–10.3)
CO2: 26 mmol/L (ref 22–32)
Chloride: 109 mmol/L (ref 101–111)
Creatinine, Ser: 0.8 mg/dL (ref 0.44–1.00)
GFR calc Af Amer: >60 mL/min (ref >60)
GFR calc non Af Amer: >60 mL/min (ref >60)
GLUCOSE: 81 mg/dL (ref 65–99)
Potassium: 3.9 mmol/L (ref 3.5–5.1)
Sodium: 140 mmol/L (ref 135–145)

## 2016-01-17 LAB — GLUCOSE, CAPILLARY: GLUCOSE-CAPILLARY: 95 mg/dL (ref 65–99)

## 2016-01-17 LAB — CBC
HCT: 36 % (ref 35.0–47.0)
HEMOGLOBIN: 12 g/dL (ref 12.0–16.0)
MCH: 31.2 pg (ref 26.0–34.0)
MCHC: 33.3 g/dL (ref 32.0–36.0)
MCV: 93.6 fL (ref 80.0–100.0)
Platelets: 150 10*3/uL (ref 150–440)
RBC: 3.84 MIL/uL (ref 3.80–5.20)
RDW: 13 % (ref 11.5–14.5)
WBC: 5.7 10*3/uL (ref 3.6–11.0)

## 2016-01-17 LAB — TROPONIN I

## 2016-01-17 MED ORDER — FUROSEMIDE 20 MG PO TABS: 20.0000 mg | ORAL_TABLET | Freq: Every day | ORAL | 0 refills | Status: DC

## 2016-01-17 MED ORDER — OXYCODONE-ACETAMINOPHEN 5-325 MG PO TABS
2.0000 | ORAL_TABLET | Freq: Once | ORAL | Status: AC
  Administered 2016-01-17: 2 via ORAL
  Filled 2016-01-17: qty 2

## 2016-01-17 NOTE — ED Provider Notes (Signed)
[facility] Emergency Department Provider Note  Time seen: 7:24 PM  I have reviewed the triage vital signs and the nursing notes.   HISTORY  Chief Complaint Hyperglycemia    HPI [name] is a 70 y.o. female with a past medical history of anxiety, CHF, diabetes, depression, fibromyalgia, hypertension, who presents the emergency department for lower extremity swelling. According to the patient she called EMS today because she is having some mild discomfort and moderate swelling in her legs. States this is been ongoing for the past several days, she took a fluid pill today when she does not normally take but states the legs have not gone down so she came to the emergency department for evaluation. EMS states upon arrival to get a blood glucose of 545, and the patient states she is not diabetic. Upon arrival to the emergency department repeat blood glucose is 95. Patient denies any urinary frequency. Denies any chest pain or shortness of breath. Patient states she was diagnosed with congestive heart failure last time she had leg swelling but only takes a fluid pill when needed.  Past Medical History:  Diagnosis Date  . Anxiety   . Chest pain   . CHF (congestive heart failure) (HCC)   . Depression   . Diabetes mellitus without complication (HCC)   . Fibromyalgia   . Graves disease   . Hypertension   . IBS (irritable bowel syndrome)   . PTSD (post-traumatic stress disorder)   . Sinus problem   . Spinal stenosis     Patient Active Problem List   Diagnosis Date Noted  . Unstable angina (HCC) 06/13/2015  . UTI (lower urinary tract infection) 06/13/2015  . Degenerative disc disease, lumbar 10/18/2014  . Sacroiliac joint dysfunction 10/18/2014  . Facet syndrome, lumbar 10/18/2014  . Cervical facet joint syndrome 10/18/2014  . Bilateral occipital neuralgia 10/18/2014  . Migraine 10/18/2014  . Neurosis, posttraumatic 10/08/2014  . Spinal stenosis   . PTSD  (post-traumatic stress disorder)     Past Surgical History:  Procedure Laterality Date  . ABDOMINAL HYSTERECTOMY    . ESOPHAGOGASTRODUODENOSCOPY (EGD) WITH PROPOFOL N/A 01/10/2015   Procedure: ESOPHAGOGASTRODUODENOSCOPY (EGD) WITH PROPOFOL;  Surgeon: Wallace Cullens, MD;  Location: [facility] ENDOSCOPY;  Service: Gastroenterology;  Laterality: N/A;  . RECTAL SURGERY    . SAVORY DILATION N/A 01/10/2015   Procedure: SAVORY DILATION;  Surgeon: Wallace Cullens, MD;  Location: [facility] ENDOSCOPY;  Service: Gastroenterology;  Laterality: N/A;  . toenail removal Bilateral    great toes  . TONSILLECTOMY AND ADENOIDECTOMY      Prior to Admission medications   Medication Sig Start Date End Date Taking? Authorizing Provider  ADVAIR DISKUS 250-50 MCG/DOSE AEPB Inhale 1 puff into the lungs 2 (two) times daily. Reported on 08/07/2015 10/03/14   Historical Provider, MD  ARIPiprazole (ABILIFY) 5 MG tablet Take 1 tablet (5 mg total) by mouth daily. Take one half pill per day for the first week, then increase to one pill per day 12/19/15   Audery Amel, MD  aspirin 81 MG tablet Take 81 mg by mouth daily.    Historical Provider, MD  cetirizine (ZYRTEC) 10 MG tablet Take 10 mg by mouth daily.    Historical Provider, MD  clonazePAM (KLONOPIN) 1 MG tablet Take 1 tablet (1 mg total) by mouth at bedtime. Take 1 tablet 3 times daily as needed 12/19/15   Audery Amel, MD  CRESTOR 10 MG tablet Take 10 mg by mouth daily.  Reported on 12/02/2015 11/08/14   Historical Provider, MD  cyclobenzaprine (FLEXERIL) 10 MG tablet Take 10 mg by mouth 2 (two) times daily.    Historical Provider, MD  fluticasone (FLONASE) 50 MCG/ACT nasal spray Place into both nostrils daily.    Historical Provider, MD  hydrocortisone (ANUSOL-HC) 2.5 % rectal cream Place 1 application rectally 3 (three) times daily as needed for hemorrhoids or itching. Reported on 12/02/2015    Historical Provider, MD  hyoscyamine (LEVSIN SL) 0.125 MG SL tablet TAKE ONE TABLET BY MOUTH EVERY 4  HOURS AS NEEDED FOR CRAMPING 02/22/15   Historical Provider, MD  levothyroxine (SYNTHROID, LEVOTHROID) 125 MCG tablet Take 125 mcg by mouth daily before breakfast.    Historical Provider, MD  loperamide (IMODIUM) 2 MG capsule Take by mouth as needed for diarrhea or loose stools. prn    Historical Provider, MD  metFORMIN (GLUCOPHAGE) 500 MG tablet Take 500 mg by mouth daily with breakfast.     Historical Provider, MD  metoprolol tartrate (LOPRESSOR) 25 MG tablet Take 25 mg by mouth 2 (two) times daily.     Historical Provider, MD  oxyCODONE-acetaminophen (PERCOCET) 7.5-325 MG tablet Limit 1 tablet by mouth 4 times per day if tolerated 01/07/16   Ewing SchleinGregory Crisp, MD  pantoprazole (PROTONIX) 40 MG tablet Take 40 mg by mouth daily.  02/01/15 02/01/16  Historical Provider, MD  polyethylene glycol powder (GLYCOLAX/MIRALAX) powder as needed. Reported on 12/02/2015 10/08/14   Historical Provider, MD  sertraline (ZOLOFT) 100 MG tablet Take 1 tablet (100 mg total) by mouth daily. 01/16/16   Audery AmelJohn T Clapacs, MD  tiotropium (SPIRIVA) 18 MCG inhalation capsule Place 18 mcg into inhaler and inhale daily. Reported on 08/07/2015    Historical Provider, MD  topiramate (TOPAMAX) 25 MG tablet Limit 1 tab  by mouth per day if tolerated 01/07/16   Ewing SchleinGregory Crisp, MD    Allergies  Allergen Reactions  . Celecoxib     Other reaction(s): Other (qualifier value) Patient is allergic to [facility] and found that it caused heart failure. CHF  . Pregabalin     Other reaction(s): Localized superficial swelling of skin    Family History  Problem Relation Age of Onset  . Alcohol abuse Mother   . Depression Mother   . Varicose Veins Mother   . Alcohol abuse Father   . Depression Father   . Alcohol abuse Sister   . Asthma Sister   . COPD Sister   . Depression Sister   . Heart disease Sister   . Hypertension Sister   . Depression Maternal Grandmother   . Diabetes Paternal Grandmother   . Stroke Paternal Grandmother     Social  History Social History  Substance Use Topics  . Smoking status: Former Smoker    Years: 25.00    Types: Cigarettes    Quit date: 12/17/2011  . Smokeless tobacco: Never Used  . Alcohol use No    Review of Systems Constitutional: Negative for fever. Cardiovascular: Negative for chest pain. Respiratory: Negative for shortness of breath. Gastrointestinal: Negative for abdominal pain Musculoskeletal: Negative for back pain.Lower extremity edema with mild discomfort. Neurological: Negative for headache 10-point ROS otherwise negative.  ____________________________________________   PHYSICAL EXAM:  VITAL SIGNS: ED Triage Vitals  Enc Vitals Group     BP 01/17/16 1855 128/72     Pulse Rate 01/17/16 1855 63     Resp --      Temp 01/17/16 1855 98 F (36.7 C)     Temp  Source 01/17/16 1855 Oral     SpO2 01/17/16 1855 99 %     Weight 01/17/16 1856 137 lb 12.8 oz (62.5 kg)     Height 01/17/16 1856 5\' 6"  (1.676 m)     Head Circumference --      Peak Flow --      Pain Score 01/17/16 1856 7     Pain Loc --      Pain Edu? --      Excl. in GC? --     Constitutional: Alert and oriented. Well appearing and in no distress. Eyes: Normal exam ENT   Head: Normocephalic and atraumatic.   Mouth/Throat: Mucous membranes are moist. Cardiovascular: Normal rate, regular rhythm. No murmur Respiratory: Normal respiratory effort without tachypnea nor retractions. Breath sounds are clear  Gastrointestinal: Soft and nontender. No distention.  Musculoskeletal: Mild tenderness palpation with mild to moderate lower extremity edema bilaterally. Neurologic:  Normal speech and language. No gross focal neurologic deficits  Skin:  Skin is warm, dry and intact.  Psychiatric: Mood and affect are normal. Speech and behavior are normal.   ____________________________________________    EKG  EKG reviewed and interpreted by myself shows normal sinus rhythm at 61 bpm, narrow QRS, normal axis, normal  intervals, no ST changes. Overall normal EKG  ____________________________________________   INITIAL IMPRESSION / ASSESSMENT AND PLAN / ED COURSE  Pertinent labs & imaging results that were available during my care of the patient were reviewed by me and considered in my medical decision making (see chart for details).  The patient presents the emergency department lower extremity edema, initially with hyperglycemia by EMS, but appears to be euglycemic in the emergency department. Patient overall appears very well, no distress, no recent chest pain or shortness of breath. Patient's only concern is swelling of her lower extremities. We will check labs including troponin, and closely monitor in the emergency department.  Labs are largely within normal limits. We'll have the patient follow up with her cardiologist Dr. Welton Flakes.  We will start the patient on Lasix 20 mg for the next 5 days. Patient states she did normal echocardiogram 3 weeks ago.  ____________________________________________   FINAL CLINICAL IMPRESSION(S) / ED DIAGNOSES  Lower extremity edema    Minna Antis, MD 01/17/16 2137

## 2016-01-17 NOTE — ED Notes (Signed)
Gave pt graham crackers and peanut butter 

## 2016-01-17 NOTE — ED Triage Notes (Signed)
[facility] and foot pain. Per EMS when they checked patients glucose her CGB CBG was 554. Patient does not appear to be in any distress at this time.

## 2016-01-22 ENCOUNTER — Telehealth: Payer: Self-pay | Admitting: *Deleted

## 2016-02-06 ENCOUNTER — Ambulatory Visit: Payer: Medicare Other | Admitting: Pain Medicine

## 2016-03-15 ENCOUNTER — Other Ambulatory Visit: Payer: Self-pay | Admitting: Pain Medicine

## 2016-04-09 ENCOUNTER — Emergency Department: Payer: Medicare Other

## 2016-04-09 ENCOUNTER — Emergency Department
Admission: EM | Admit: 2016-04-09 | Discharge: 2016-04-09 | Disposition: A | Discharge status: Home / Self Care | Payer: Medicare Other | Attending: Emergency Medicine | Admitting: Emergency Medicine

## 2016-04-09 ENCOUNTER — Encounter: Payer: Self-pay | Admitting: Emergency Medicine

## 2016-04-09 DIAGNOSIS — Z79899 Other long term (current) drug therapy: Secondary | ICD-10-CM | POA: Insufficient documentation

## 2016-04-09 DIAGNOSIS — R4182 Altered mental status, unspecified: Secondary | ICD-10-CM | POA: Diagnosis present

## 2016-04-09 DIAGNOSIS — F17210 Nicotine dependence, cigarettes, uncomplicated: Secondary | ICD-10-CM | POA: Diagnosis not present

## 2016-04-09 DIAGNOSIS — I110 Hypertensive heart disease with heart failure: Secondary | ICD-10-CM | POA: Insufficient documentation

## 2016-04-09 DIAGNOSIS — I509 Heart failure, unspecified: Secondary | ICD-10-CM | POA: Diagnosis not present

## 2016-04-09 DIAGNOSIS — Z7984 Long term (current) use of oral hypoglycemic drugs: Secondary | ICD-10-CM | POA: Diagnosis not present

## 2016-04-09 DIAGNOSIS — R404 Transient alteration of awareness: Secondary | ICD-10-CM

## 2016-04-09 DIAGNOSIS — Z7982 Long term (current) use of aspirin: Secondary | ICD-10-CM | POA: Diagnosis not present

## 2016-04-09 LAB — CBC WITH DIFFERENTIAL/PLATELET
BASOS ABS: 0.1 10*3/uL (ref 0–0.1)
BASOS PCT: 1 %
Eosinophils Absolute: 0.2 10*3/uL (ref 0–0.7)
Eosinophils Relative: 2 %
HEMATOCRIT: 37.3 % (ref 35.0–47.0)
HEMOGLOBIN: 12.3 g/dL (ref 12.0–16.0)
LYMPHS PCT: 23 %
Lymphs Abs: 2.5 10*3/uL (ref 1.0–3.6)
MCH: 30.4 pg (ref 26.0–34.0)
MCHC: 33.1 g/dL (ref 32.0–36.0)
MCV: 92.1 fL (ref 80.0–100.0)
Monocytes Absolute: 1.1 10*3/uL — ABNORMAL HIGH (ref 0.2–0.9)
Monocytes Relative: 10 %
NEUTROS ABS: 6.9 10*3/uL — AB (ref 1.4–6.5)
NEUTROS PCT: 64 %
Platelets: 303 10*3/uL (ref 150–440)
RBC: 4.05 MIL/uL (ref 3.80–5.20)
RDW: 12.7 % (ref 11.5–14.5)
WBC: 10.9 10*3/uL (ref 3.6–11.0)

## 2016-04-09 LAB — COMPREHENSIVE METABOLIC PANEL
ALBUMIN: 3.5 g/dL (ref 3.5–5.0)
ALT: 10 U/L — AB (ref 14–54)
AST: 19 U/L (ref 15–41)
Alkaline Phosphatase: 73 U/L (ref 38–126)
Anion gap: 7 (ref 5–15)
BUN: 8 mg/dL (ref 6–20)
CHLORIDE: 102 mmol/L (ref 101–111)
CO2: 27 mmol/L (ref 22–32)
CREATININE: 0.85 mg/dL (ref 0.44–1.00)
Calcium: 9.1 mg/dL (ref 8.9–10.3)
GFR calc non Af Amer: >60 mL/min (ref >60)
GLUCOSE: 113 mg/dL — AB (ref 65–99)
Potassium: 3.6 mmol/L (ref 3.5–5.1)
SODIUM: 136 mmol/L (ref 135–145)
Total Bilirubin: 0.2 mg/dL — ABNORMAL LOW (ref 0.3–1.2)
Total Protein: 7.7 g/dL (ref 6.5–8.1)

## 2016-04-09 LAB — URINALYSIS COMPLETE WITH MICROSCOPIC (ARMC ONLY)
BACTERIA UA: NONE SEEN
Bilirubin Urine: NEGATIVE
Glucose, UA: NEGATIVE mg/dL
Ketones, ur: NEGATIVE mg/dL
Leukocytes, UA: NEGATIVE
NITRITE: NEGATIVE
PROTEIN: NEGATIVE mg/dL
SPECIFIC GRAVITY, URINE: 1.01 (ref 1.005–1.030)
pH: 6 (ref 5.0–8.0)

## 2016-04-09 LAB — AMMONIA: Ammonia: 23 umol/L (ref 9–35)

## 2016-04-09 LAB — TROPONIN I: TROPONIN I: 0.04 ng/mL — AB (ref <0.03)

## 2016-04-09 NOTE — ED Notes (Signed)
Pt up to the bathroom at this time. Urine collected and sent to the lab. NAD noted. Will continue to monitor for further patient needs.

## 2016-04-09 NOTE — ED Notes (Signed)
Pt visualized in NAD. Pt sitting in bed eating at this time. Will continue to monitor for further patient needs.

## 2016-04-09 NOTE — ED Notes (Signed)
Pt sitting in bed at this time with a food tray. Pt previously assisted to the bathroom by Selena BattenKim, RN. Pt is visualized in NAD. Pt given a phone to speak with her friend "Sissie" at this time. Will continue to monitor for further patient needs at this time.

## 2016-04-09 NOTE — ED Provider Notes (Addendum)
[facility] Emergency Department Provider Note  ____________________________________________   I have reviewed the triage vital signs and the nursing notes.   HISTORY  Chief Complaint Altered Mental Status    HPI [name] is a 70 y.o. female who presents today complaining of nothing at this time. Apparently she states she felt somewhat confused for approximately 15-20 minutes earlier today and then went back to her baseline. She has also complains remembers the episode. She states she felt somewhat confused. She had no focal numbness or weakness no headache no shortness of breath no chest pain, she does have a chronic cough however she has had no fevers and no productive cough. She denies  any other complaint. At this time she feels completely back to baseline.      Past Medical History:  Diagnosis Date  . Anxiety   . Chest pain   . CHF (congestive heart failure) (HCC)   . Depression   . Diabetes mellitus without complication (HCC)   . Fibromyalgia   . Graves disease   . Hypertension   . IBS (irritable bowel syndrome)   . PTSD (post-traumatic stress disorder)   . Sinus problem   . Spinal stenosis     Patient Active Problem List   Diagnosis Date Noted  . Unstable angina (HCC) 06/13/2015  . UTI (lower urinary tract infection) 06/13/2015  . Degenerative disc disease, lumbar 10/18/2014  . Sacroiliac joint dysfunction 10/18/2014  . Facet syndrome, lumbar 10/18/2014  . Cervical facet joint syndrome 10/18/2014  . Bilateral occipital neuralgia 10/18/2014  . Migraine 10/18/2014  . Neurosis, posttraumatic 10/08/2014  . Spinal stenosis   . PTSD (post-traumatic stress disorder)     Past Surgical History:  Procedure Laterality Date  . ABDOMINAL HYSTERECTOMY    . ESOPHAGOGASTRODUODENOSCOPY (EGD) WITH PROPOFOL N/A 01/10/2015   Procedure: ESOPHAGOGASTRODUODENOSCOPY (EGD) WITH PROPOFOL;  Surgeon: Wallace CullensPaul Y Oh, MD;  [facility];  Service:  Gastroenterology;  Laterality: N/A;  . RECTAL SURGERY    . SAVORY DILATION N/A 01/10/2015   Procedure: SAVORY DILATION;  Surgeon: Wallace CullensPaul Y Oh, MD;  [facility];  Service: Gastroenterology;  Laterality: N/A;  . toenail removal Bilateral    great toes  . TONSILLECTOMY AND ADENOIDECTOMY      Prior to Admission medications   Medication Sig Start Date End Date Taking? Authorizing Provider  ADVAIR DISKUS 250-50 MCG/DOSE AEPB Inhale 1 puff into the lungs 2 (two) times daily. Reported on 08/07/2015 10/03/14   Historical Provider, MD  ARIPiprazole (ABILIFY) 5 MG tablet Take 1 tablet (5 mg total) by mouth daily. Take one half pill per day for the first week, then increase to one pill per day 12/19/15   Audery AmelJohn T Clapacs, MD  aspirin 81 MG tablet Take 81 mg by mouth daily.    Historical Provider, MD  cetirizine (ZYRTEC) 10 MG tablet Take 10 mg by mouth daily.    Historical Provider, MD  clonazePAM (KLONOPIN) 1 MG tablet Take 1 tablet (1 mg total) by mouth at bedtime. Take 1 tablet 3 times daily as needed 12/19/15   Audery AmelJohn T Clapacs, MD  CRESTOR 10 MG tablet Take 10 mg by mouth daily. Reported on 12/02/2015 11/08/14   Historical Provider, MD  cyclobenzaprine (FLEXERIL) 10 MG tablet Take 10 mg by mouth 2 (two) times daily.    Historical Provider, MD  fluticasone (FLONASE) 50 MCG/ACT nasal spray Place into both nostrils daily.    Historical Provider, MD  furosemide (LASIX) 20 MG tablet  Take 1 tablet (20 mg total) by mouth daily. 01/17/16 01/16/17  Minna Antis, MD  hydrocortisone (ANUSOL-HC) 2.5 % rectal cream Place 1 application rectally 3 (three) times daily as needed for hemorrhoids or itching. Reported on 12/02/2015    Historical Provider, MD  hyoscyamine (LEVSIN SL) 0.125 MG SL tablet TAKE ONE TABLET BY MOUTH EVERY 4 HOURS AS NEEDED FOR CRAMPING 02/22/15   Historical Provider, MD  levothyroxine (SYNTHROID, LEVOTHROID) 125 MCG tablet Take 125 mcg by mouth daily before breakfast.    Historical Provider, MD   loperamide (IMODIUM) 2 MG capsule Take by mouth as needed for diarrhea or loose stools. prn    Historical Provider, MD  metFORMIN (GLUCOPHAGE) 500 MG tablet Take 500 mg by mouth daily with breakfast.     Historical Provider, MD  metoprolol tartrate (LOPRESSOR) 25 MG tablet Take 25 mg by mouth 2 (two) times daily.     Historical Provider, MD  oxyCODONE-acetaminophen (PERCOCET) 7.5-325 MG tablet Limit 1 tablet by mouth 4 times per day if tolerated 01/07/16   Ewing Schlein, MD  pantoprazole (PROTONIX) 40 MG tablet Take 40 mg by mouth daily.  02/01/15 02/01/16  Historical Provider, MD  polyethylene glycol powder (GLYCOLAX/MIRALAX) powder as needed. Reported on 12/02/2015 10/08/14   Historical Provider, MD  sertraline (ZOLOFT) 100 MG tablet Take 1 tablet (100 mg total) by mouth daily. 01/16/16   Audery Amel, MD  tiotropium (SPIRIVA) 18 MCG inhalation capsule Place 18 mcg into inhaler and inhale daily. Reported on 08/07/2015    Historical Provider, MD  topiramate (TOPAMAX) 25 MG tablet Limit 1 tab  by mouth per day if tolerated 01/07/16   Ewing Schlein, MD    Allergies Celecoxib and Pregabalin  Family History  Problem Relation Age of Onset  . Alcohol abuse Mother   . Depression Mother   . Varicose Veins Mother   . Alcohol abuse Father   . Depression Father   . Alcohol abuse Sister   . Asthma Sister   . COPD Sister   . Depression Sister   . Heart disease Sister   . Hypertension Sister   . Depression Maternal Grandmother   . Diabetes Paternal Grandmother   . Stroke Paternal Grandmother     Social History Social History  Substance Use Topics  . Smoking status: Current Some Day Smoker    Years: 25.00    Types: Cigarettes  . Smokeless tobacco: Never Used  . Alcohol use No    Review of Systems Constitutional: No fever/chills Eyes: No visual changes. ENT: No sore throat. No stiff neck no neck pain Cardiovascular: Denies chest pain. Respiratory: Denies shortness of  breath. Gastrointestinal:   no vomiting.  No diarrhea.  No constipation. Genitourinary: Negative for dysuria. Musculoskeletal: Negative lower extremity swelling Skin: Negative for rash. Neurological: Negative for severe headaches, focal weakness or numbness. 10-point ROS otherwise negative.  ____________________________________________   PHYSICAL EXAM:  VITAL SIGNS: ED Triage Vitals  Enc Vitals Group     BP 04/09/16 1458 (!) 118/55     Pulse Rate 04/09/16 1458 85     Resp --      Temp 04/09/16 1458 98.1 F (36.7 C)     Temp Source 04/09/16 1458 Oral     SpO2 04/09/16 1458 96 %     Weight 04/09/16 1459 137 lb (62.1 kg)     Height 04/09/16 1459 5\' 6"  (1.676 m)     Head Circumference --      Peak Flow --  Pain Score --      Pain Loc --      Pain Edu? --      Excl. in GC? --     Constitutional: Alert and oriented. Well appearing and in no acute distress. Eyes: Conjunctivae are normal. PERRL. EOMI. Head: Atraumatic. Nose: No congestion/rhinnorhea. Mouth/Throat: Mucous membranes are moist.  Oropharynx non-erythematous. Neck: No stridor.   Nontender with no meningismus Cardiovascular: Normal rate, regular rhythm. Grossly normal heart sounds.  Good peripheral circulation. Respiratory: Normal respiratory effort.  No retractions. Lungs CTAB. Abdominal: Soft and nontender. No distention. No guarding no rebound Back:  There is no focal tenderness or step off.  there is no midline tenderness there are no lesions noted. there is no CVA tenderness Musculoskeletal: No lower extremity tenderness, no upper extremity tenderness. No joint effusions, no DVT signs strong distal pulses no edema Neurologic:  Normal speech and language. No gross focal neurologic deficits are appreciated.  Skin:  Skin is warm, dry and intact. No rash noted. Psychiatric: Mood and affect are normal. Speech and behavior are normal.  ____________________________________________   LABS (all labs ordered are  listed, but only abnormal results are displayed)  Labs Reviewed  CBC WITH DIFFERENTIAL/PLATELET - Abnormal; Notable for the following:       Result Value   Neutro Abs 6.9 (*)    Monocytes Absolute 1.1 (*)    All other components within normal limits  TROPONIN I - Abnormal; Notable for the following:    Troponin I 0.04 (*)    All other components within normal limits  COMPREHENSIVE METABOLIC PANEL - Abnormal; Notable for the following:    Glucose, Bld 113 (*)    ALT 10 (*)    Total Bilirubin 0.2 (*)    All other components within normal limits  URINE CULTURE  AMMONIA  URINALYSIS COMPLETEWITH MICROSCOPIC ([facility] ONLY)   ____________________________________________  EKG  I personally interpreted any EKGs ordered by me or triage Sinus rhythm rate 87 bpm no acute ST elevation or acute ST depression no acute ischemic noted ____________________________________________  RADIOLOGY  I reviewed any imaging ordered by me or triage that were performed during my shift and, if possible, patient and/or family made aware of any abnormal findings. ____________________________________________   PROCEDURES  Procedure(s) performed: None  Procedures  Critical Care performed: None  ____________________________________________   INITIAL IMPRESSION / ASSESSMENT AND PLAN / ED COURSE  Pertinent labs & imaging results that were available during my care of the patient were reviewed by me and considered in my medical decision making (see chart for details).  Patient apparently had a transitory area of confusion today. Blood work and vital signs reassuring exam is reassuring neurologic exam is reassuring on evidence of ongoing stroke. No evidence of dysrhythmia, patient has no complaints. CT of the head and chest x-ray are reassuring. Oxygen saturation 97%. She does have a slight cough occasionally. Unclear what that was. She is a patient who is a DO NOT RESUSCITATE status. She may not wish to be  admitted to the hospital we will discuss with her what her thoughts are on this after we get a urinalysis which is currently pending.  ----------------------------------------- 7:57 PM on 04/09/2016 -----------------------------------------  GCS remains 15 NIH stroke scale remains 0 patient is requesting discharge. Her lungs are clear. She is titrated have some slightly increased restaurant rate but when I counted out is 19. She is in no acute distress. She has no evidence of respiratory issue at this time although  she has had an occasional cough. Low work and vitals are reassuring. I've offered her admission to the hospital but she states she very much would prefer to go home. It is Thanksgiving. She has called her daughter for a ride and she is eagerly waiting discharge. We will discharge her at her request with return precautions given and understood.   ----------------------------------------- 8:20 PM on 04/09/2016 -----------------------------------------  Daughter at bedside agrees complete with management, states that the patient is at her baseline, now they wish her to be admitted and we will discharge her with close outpatient follow-up. Trivial elevation of troponin noted, patient had no chest pain or shortness of breath and do not wish to stay for repeat troponin, and given our lab this is actually a normal finding. Clinical Course    ____________________________________________   FINAL CLINICAL IMPRESSION(S) / ED DIAGNOSES  Final diagnoses:  None      This chart was dictated using voice recognition software.  Despite best efforts to proofread,  errors can occur which can change meaning.      Jeanmarie Plant, MD 04/09/16 1758    Jeanmarie Plant, MD 04/09/16 1958    Jeanmarie Plant, MD 04/09/16 8119    Jeanmarie Plant, MD 04/09/16 2021

## 2016-04-09 NOTE — ED Notes (Signed)
Report given to Vanessa, RN.

## 2016-04-09 NOTE — ED Notes (Signed)
Pt taken to CT at this time.

## 2016-04-09 NOTE — ED Triage Notes (Signed)
[facility]. Pt had 20min episode of confusion, difficultly talking, hard to arouse. Symptoms lasted approx 20 min and resolved. Pt states she is at baseline now. Ems gave solumedrol 125mg  iv and combuvent for crackles right upper lobe.

## 2016-04-09 NOTE — Discharge Instructions (Signed)
Return to the emergency room for any new or worrisome symptoms include insurance of breath, cough, confusion, headache, stiff neck, numbness, weakness, trouble speaking, or anything else of concern. Follow up closely with your doctor. You  would prefer not to be admitted to the hospital at this time, this is not unreasonable, however, it does limit our ability to further evaluate you. We are, however, reassured by what we see thus far.

## 2016-04-11 LAB — URINE CULTURE

## 2016-06-08 ENCOUNTER — Other Ambulatory Visit: Payer: Self-pay | Admitting: Pain Medicine

## 2016-11-24 ENCOUNTER — Emergency Department: Payer: No Typology Code available for payment source

## 2016-11-24 ENCOUNTER — Emergency Department
Admission: EM | Admit: 2016-11-24 | Discharge: 2016-11-24 | Disposition: A | Discharge status: Home / Self Care | Payer: No Typology Code available for payment source | Attending: Emergency Medicine | Admitting: Emergency Medicine

## 2016-11-24 ENCOUNTER — Encounter: Payer: Self-pay | Admitting: Emergency Medicine

## 2016-11-24 DIAGNOSIS — S5001XA Contusion of right elbow, initial encounter: Secondary | ICD-10-CM | POA: Diagnosis not present

## 2016-11-24 DIAGNOSIS — Z79891 Long term (current) use of opiate analgesic: Secondary | ICD-10-CM | POA: Diagnosis not present

## 2016-11-24 DIAGNOSIS — Z7982 Long term (current) use of aspirin: Secondary | ICD-10-CM | POA: Insufficient documentation

## 2016-11-24 DIAGNOSIS — Y999 Unspecified external cause status: Secondary | ICD-10-CM | POA: Diagnosis not present

## 2016-11-24 DIAGNOSIS — Z7984 Long term (current) use of oral hypoglycemic drugs: Secondary | ICD-10-CM | POA: Diagnosis not present

## 2016-11-24 DIAGNOSIS — S39012A Strain of muscle, fascia and tendon of lower back, initial encounter: Secondary | ICD-10-CM

## 2016-11-24 DIAGNOSIS — Z79899 Other long term (current) drug therapy: Secondary | ICD-10-CM | POA: Diagnosis not present

## 2016-11-24 DIAGNOSIS — I110 Hypertensive heart disease with heart failure: Secondary | ICD-10-CM | POA: Diagnosis not present

## 2016-11-24 DIAGNOSIS — Z7951 Long term (current) use of inhaled steroids: Secondary | ICD-10-CM | POA: Insufficient documentation

## 2016-11-24 DIAGNOSIS — I509 Heart failure, unspecified: Secondary | ICD-10-CM | POA: Insufficient documentation

## 2016-11-24 DIAGNOSIS — Y939 Activity, unspecified: Secondary | ICD-10-CM | POA: Insufficient documentation

## 2016-11-24 DIAGNOSIS — I10 Essential (primary) hypertension: Secondary | ICD-10-CM | POA: Insufficient documentation

## 2016-11-24 DIAGNOSIS — Y92410 Unspecified street and highway as the place of occurrence of the external cause: Secondary | ICD-10-CM | POA: Diagnosis not present

## 2016-11-24 DIAGNOSIS — F17210 Nicotine dependence, cigarettes, uncomplicated: Secondary | ICD-10-CM | POA: Diagnosis not present

## 2016-11-24 DIAGNOSIS — E119 Type 2 diabetes mellitus without complications: Secondary | ICD-10-CM | POA: Insufficient documentation

## 2016-11-24 DIAGNOSIS — S161XXA Strain of muscle, fascia and tendon at neck level, initial encounter: Secondary | ICD-10-CM | POA: Diagnosis not present

## 2016-11-24 DIAGNOSIS — S199XXA Unspecified injury of neck, initial encounter: Secondary | ICD-10-CM | POA: Diagnosis present
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
## 2016-11-24 NOTE — ED Notes (Signed)
Verbal order from Dr. Scotty CourtStafford to remove c collar at this time.

## 2016-12-10 ENCOUNTER — Ambulatory Visit: Payer: Medicare Other | Admitting: Student in an Organized Health Care Education/Training Program

## 2017-01-03 ENCOUNTER — Emergency Department: Payer: Medicare Other

## 2017-01-03 ENCOUNTER — Encounter: Payer: Self-pay | Admitting: Emergency Medicine

## 2017-01-03 ENCOUNTER — Observation Stay
Admission: EM | Admit: 2017-01-03 | Discharge: 2017-01-04 | Disposition: A | Discharge status: Home / Self Care | Payer: Medicare Other | Attending: Internal Medicine | Admitting: Internal Medicine

## 2017-01-03 DIAGNOSIS — F329 Major depressive disorder, single episode, unspecified: Secondary | ICD-10-CM | POA: Diagnosis not present

## 2017-01-03 DIAGNOSIS — Z79899 Other long term (current) drug therapy: Secondary | ICD-10-CM | POA: Diagnosis not present

## 2017-01-03 DIAGNOSIS — F4310 Post-traumatic stress disorder, unspecified: Secondary | ICD-10-CM | POA: Insufficient documentation

## 2017-01-03 DIAGNOSIS — K219 Gastro-esophageal reflux disease without esophagitis: Secondary | ICD-10-CM | POA: Diagnosis not present

## 2017-01-03 DIAGNOSIS — R911 Solitary pulmonary nodule: Secondary | ICD-10-CM | POA: Insufficient documentation

## 2017-01-03 DIAGNOSIS — E039 Hypothyroidism, unspecified: Secondary | ICD-10-CM | POA: Diagnosis not present

## 2017-01-03 DIAGNOSIS — I951 Orthostatic hypotension: Secondary | ICD-10-CM | POA: Insufficient documentation

## 2017-01-03 DIAGNOSIS — J40 Bronchitis, not specified as acute or chronic: Secondary | ICD-10-CM | POA: Diagnosis present

## 2017-01-03 DIAGNOSIS — R42 Dizziness and giddiness: Secondary | ICD-10-CM

## 2017-01-03 DIAGNOSIS — J449 Chronic obstructive pulmonary disease, unspecified: Secondary | ICD-10-CM | POA: Insufficient documentation

## 2017-01-03 DIAGNOSIS — E119 Type 2 diabetes mellitus without complications: Secondary | ICD-10-CM | POA: Insufficient documentation

## 2017-01-03 DIAGNOSIS — J209 Acute bronchitis, unspecified: Secondary | ICD-10-CM | POA: Diagnosis not present

## 2017-01-03 DIAGNOSIS — R0600 Dyspnea, unspecified: Principal | ICD-10-CM | POA: Insufficient documentation

## 2017-01-03 DIAGNOSIS — I110 Hypertensive heart disease with heart failure: Secondary | ICD-10-CM | POA: Insufficient documentation

## 2017-01-03 DIAGNOSIS — Z7982 Long term (current) use of aspirin: Secondary | ICD-10-CM | POA: Diagnosis not present

## 2017-01-03 DIAGNOSIS — I509 Heart failure, unspecified: Secondary | ICD-10-CM | POA: Insufficient documentation

## 2017-01-03 DIAGNOSIS — F17210 Nicotine dependence, cigarettes, uncomplicated: Secondary | ICD-10-CM | POA: Diagnosis not present

## 2017-01-03 DIAGNOSIS — K589 Irritable bowel syndrome without diarrhea: Secondary | ICD-10-CM | POA: Insufficient documentation

## 2017-01-03 DIAGNOSIS — E876 Hypokalemia: Secondary | ICD-10-CM | POA: Diagnosis not present

## 2017-01-03 DIAGNOSIS — Z7984 Long term (current) use of oral hypoglycemic drugs: Secondary | ICD-10-CM | POA: Diagnosis not present

## 2017-01-03 DIAGNOSIS — R52 Pain, unspecified: Secondary | ICD-10-CM | POA: Diagnosis present

## 2017-01-03 DIAGNOSIS — E785 Hyperlipidemia, unspecified: Secondary | ICD-10-CM | POA: Diagnosis not present

## 2017-01-03 DIAGNOSIS — M797 Fibromyalgia: Secondary | ICD-10-CM | POA: Insufficient documentation

## 2017-01-03 LAB — URINALYSIS, COMPLETE (UACMP) WITH MICROSCOPIC
BILIRUBIN URINE: NEGATIVE
Glucose, UA: NEGATIVE mg/dL
Hgb urine dipstick: NEGATIVE
Ketones, ur: NEGATIVE mg/dL
LEUKOCYTES UA: NEGATIVE
Nitrite: NEGATIVE
PH: 6 (ref 5.0–8.0)
Protein, ur: NEGATIVE mg/dL
SPECIFIC GRAVITY, URINE: 1.01 (ref 1.005–1.030)

## 2017-01-03 LAB — COMPREHENSIVE METABOLIC PANEL
ALT: 10 U/L — AB (ref 14–54)
AST: 21 U/L (ref 15–41)
Albumin: 4.1 g/dL (ref 3.5–5.0)
Alkaline Phosphatase: 78 U/L (ref 38–126)
Anion gap: 9 (ref 5–15)
BILIRUBIN TOTAL: 0.5 mg/dL (ref 0.3–1.2)
BUN: 15 mg/dL (ref 6–20)
CHLORIDE: 100 mmol/L — AB (ref 101–111)
CO2: 27 mmol/L (ref 22–32)
CREATININE: 0.76 mg/dL (ref 0.44–1.00)
Calcium: 9.4 mg/dL (ref 8.9–10.3)
GFR calc Af Amer: >60 mL/min (ref >60)
GLUCOSE: 138 mg/dL — AB (ref 65–99)
Potassium: 3.4 mmol/L — ABNORMAL LOW (ref 3.5–5.1)
Sodium: 136 mmol/L (ref 135–145)
TOTAL PROTEIN: 7.1 g/dL (ref 6.5–8.1)

## 2017-01-03 LAB — TROPONIN I

## 2017-01-03 LAB — CBC WITH DIFFERENTIAL/PLATELET
BASOS ABS: 0 10*3/uL (ref 0–0.1)
Basophils Relative: 1 %
EOS PCT: 2 %
Eosinophils Absolute: 0.1 10*3/uL (ref 0–0.7)
HCT: 44 % (ref 35.0–47.0)
Hemoglobin: 14.9 g/dL (ref 12.0–16.0)
LYMPHS PCT: 24 %
Lymphs Abs: 1.6 10*3/uL (ref 1.0–3.6)
MCH: 30.1 pg (ref 26.0–34.0)
MCHC: 33.8 g/dL (ref 32.0–36.0)
MCV: 89.2 fL (ref 80.0–100.0)
MONO ABS: 0.6 10*3/uL (ref 0.2–0.9)
MONOS PCT: 10 %
Neutro Abs: 4.2 10*3/uL (ref 1.4–6.5)
Neutrophils Relative %: 63 %
PLATELETS: 245 10*3/uL (ref 150–440)
RBC: 4.94 MIL/uL (ref 3.80–5.20)
RDW: 13.7 % (ref 11.5–14.5)
WBC: 6.6 10*3/uL (ref 3.6–11.0)

## 2017-01-03 LAB — BRAIN NATRIURETIC PEPTIDE: B NATRIURETIC PEPTIDE 5: 44 pg/mL (ref 0.0–100.0)

## 2017-01-03 MED ORDER — SODIUM CHLORIDE 0.9 % IV BOLUS (SEPSIS)
500.0000 mL | Freq: Once | INTRAVENOUS | Status: AC
  Administered 2017-01-03: 500 mL via INTRAVENOUS

## 2017-01-03 MED ORDER — PREDNISONE 20 MG PO TABS
60.0000 mg | ORAL_TABLET | Freq: Once | ORAL | Status: AC
  Administered 2017-01-04: 60 mg via ORAL
  Filled 2017-01-03: qty 3

## 2017-01-03 MED ORDER — MECLIZINE HCL 25 MG PO TABS
25.0000 mg | ORAL_TABLET | Freq: Once | ORAL | Status: AC
  Administered 2017-01-03: 25 mg via ORAL
  Filled 2017-01-03: qty 1

## 2017-01-03 MED ORDER — POTASSIUM CHLORIDE CRYS ER 20 MEQ PO TBCR
20.0000 meq | EXTENDED_RELEASE_TABLET | Freq: Once | ORAL | Status: DC
  Filled 2017-01-03: qty 1

## 2017-01-03 MED ORDER — IOPAMIDOL (ISOVUE-370) INJECTION 76%
75.0000 mL | Freq: Once | INTRAVENOUS | Status: AC | PRN
Start: 2017-01-03 — End: 2017-01-03
  Administered 2017-01-03: 75 mL via INTRAVENOUS

## 2017-01-03 MED ORDER — IPRATROPIUM-ALBUTEROL 0.5-2.5 (3) MG/3ML IN SOLN
3.0000 mL | Freq: Once | RESPIRATORY_TRACT | Status: AC
  Administered 2017-01-04: 3 mL via RESPIRATORY_TRACT
  Filled 2017-01-03: qty 3

## 2017-01-03 NOTE — ED Triage Notes (Signed)
Patient from home via [facility]. Patient reports increasing pain and general malaise x 3 weeks. Patient reports history of fibromyalgia and states that the weather has been making her feel worse. Patient also complaining of shortness of breath. Patient with even, unlabored respirations noted. Patient denies cough or fever at home. Patient alert and oriented x4.
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
## 2017-01-04 ENCOUNTER — Observation Stay
Admit: 2017-01-04 | Discharge: 2017-01-04 | Disposition: A | Discharge status: Home / Self Care | Payer: Medicare Other | Attending: Family Medicine | Admitting: Family Medicine

## 2017-01-04 ENCOUNTER — Observation Stay: Payer: Medicare Other

## 2017-01-04 DIAGNOSIS — R0600 Dyspnea, unspecified: Secondary | ICD-10-CM | POA: Diagnosis not present

## 2017-01-04 DIAGNOSIS — J40 Bronchitis, not specified as acute or chronic: Secondary | ICD-10-CM | POA: Diagnosis present

## 2017-01-04 LAB — CBC
HCT: 42.8 % (ref 35.0–47.0)
Hemoglobin: 14.5 g/dL (ref 12.0–16.0)
MCH: 30.7 pg (ref 26.0–34.0)
MCHC: 33.8 g/dL (ref 32.0–36.0)
MCV: 90.6 fL (ref 80.0–100.0)
PLATELETS: 217 10*3/uL (ref 150–440)
RBC: 4.73 MIL/uL (ref 3.80–5.20)
RDW: 13.6 % (ref 11.5–14.5)
WBC: 6.2 10*3/uL (ref 3.6–11.0)

## 2017-01-04 LAB — BASIC METABOLIC PANEL
Anion gap: 7 (ref 5–15)
BUN: 15 mg/dL (ref 6–20)
CALCIUM: 9.4 mg/dL (ref 8.9–10.3)
CO2: 26 mmol/L (ref 22–32)
CREATININE: 0.68 mg/dL (ref 0.44–1.00)
Chloride: 103 mmol/L (ref 101–111)
GFR calc non Af Amer: >60 mL/min (ref >60)
Glucose, Bld: 169 mg/dL — ABNORMAL HIGH (ref 65–99)
Potassium: 3.5 mmol/L (ref 3.5–5.1)
Sodium: 136 mmol/L (ref 135–145)

## 2017-01-04 LAB — GLUCOSE, CAPILLARY
GLUCOSE-CAPILLARY: 228 mg/dL — AB (ref 65–99)
GLUCOSE-CAPILLARY: 142 mg/dL — AB (ref 65–99)
Glucose-Capillary: 189 mg/dL — ABNORMAL HIGH (ref 65–99)

## 2017-01-04 LAB — HEMOGLOBIN A1C
HEMOGLOBIN A1C: 6.1 % — AB (ref 4.8–5.6)
MEAN PLASMA GLUCOSE: 128.37 mg/dL

## 2017-01-04 LAB — ECHOCARDIOGRAM COMPLETE
Height: 66 in
WEIGHTICAEL: 2144 [oz_av]

## 2017-01-04 MED ORDER — ACETAMINOPHEN 325 MG PO TABS
650.0000 mg | ORAL_TABLET | Freq: Four times a day (QID) | ORAL | Status: DC | PRN
  Administered 2017-01-04: 650 mg via ORAL
  Filled 2017-01-04: qty 2

## 2017-01-04 MED ORDER — SODIUM CHLORIDE 0.9% FLUSH: 3.0000 mL | INTRAVENOUS | Status: DC | PRN

## 2017-01-04 MED ORDER — DEXTROSE 5 % IV SOLN
500.0000 mg | INTRAVENOUS | Status: DC
  Administered 2017-01-04: 500 mg via INTRAVENOUS
  Filled 2017-01-04 (×2): qty 500

## 2017-01-04 MED ORDER — ALBUTEROL SULFATE (2.5 MG/3ML) 0.083% IN NEBU: 2.5000 mg | INHALATION_SOLUTION | Freq: Four times a day (QID) | RESPIRATORY_TRACT | Status: DC | PRN

## 2017-01-04 MED ORDER — FLUOXETINE HCL 20 MG PO CAPS
40.0000 mg | ORAL_CAPSULE | Freq: Every day | ORAL | Status: DC
  Administered 2017-01-04: 40 mg via ORAL
  Filled 2017-01-04: qty 2

## 2017-01-04 MED ORDER — LORAZEPAM 0.5 MG PO TABS: 0.5000 mg | ORAL_TABLET | Freq: Every day | ORAL | Status: DC

## 2017-01-04 MED ORDER — INSULIN ASPART 100 UNIT/ML ~~LOC~~ SOLN
0.0000 [IU] | Freq: Three times a day (TID) | SUBCUTANEOUS | Status: DC
  Administered 2017-01-04: 2 [IU] via SUBCUTANEOUS
  Administered 2017-01-04: 3 [IU] via SUBCUTANEOUS
  Administered 2017-01-04: 1 [IU] via SUBCUTANEOUS
  Filled 2017-01-04 (×3): qty 1

## 2017-01-04 MED ORDER — BISACODYL 5 MG PO TBEC: 5.0000 mg | DELAYED_RELEASE_TABLET | Freq: Every day | ORAL | Status: DC | PRN

## 2017-01-04 MED ORDER — MELATONIN 5 MG PO TABS
5.0000 mg | ORAL_TABLET | Freq: Every day | ORAL | Status: DC
  Filled 2017-01-04: qty 1

## 2017-01-04 MED ORDER — TIOTROPIUM BROMIDE MONOHYDRATE 18 MCG IN CAPS
18.0000 ug | ORAL_CAPSULE | Freq: Every day | RESPIRATORY_TRACT | Status: DC
  Administered 2017-01-04: 18 ug via RESPIRATORY_TRACT
  Filled 2017-01-04: qty 5

## 2017-01-04 MED ORDER — LORAZEPAM 0.5 MG PO TABS
0.5000 mg | ORAL_TABLET | Freq: Every day | ORAL | Status: DC
  Administered 2017-01-04: 0.5 mg via ORAL
  Filled 2017-01-04: qty 1

## 2017-01-04 MED ORDER — MECLIZINE HCL 12.5 MG PO TABS
12.5000 mg | ORAL_TABLET | Freq: Three times a day (TID) | ORAL | Status: DC | PRN
  Administered 2017-01-04: 12.5 mg via ORAL
  Filled 2017-01-04 (×2): qty 1

## 2017-01-04 MED ORDER — OXYCODONE HCL 5 MG PO TABS: 5.0000 mg | ORAL_TABLET | ORAL | Status: DC | PRN

## 2017-01-04 MED ORDER — LEVOTHYROXINE SODIUM 88 MCG PO TABS
88.0000 ug | ORAL_TABLET | Freq: Every day | ORAL | Status: DC
  Administered 2017-01-04: 88 ug via ORAL
  Filled 2017-01-04: qty 1

## 2017-01-04 MED ORDER — METHYLPREDNISOLONE SODIUM SUCC 125 MG IJ SOLR
60.0000 mg | Freq: Four times a day (QID) | INTRAMUSCULAR | Status: DC
  Administered 2017-01-04 (×3): 60 mg via INTRAVENOUS
  Filled 2017-01-04 (×3): qty 2

## 2017-01-04 MED ORDER — POTASSIUM CHLORIDE CRYS ER 10 MEQ PO TBCR
10.0000 meq | EXTENDED_RELEASE_TABLET | Freq: Every day | ORAL | Status: DC
  Administered 2017-01-04: 10 meq via ORAL
  Filled 2017-01-04: qty 1

## 2017-01-04 MED ORDER — INSULIN ASPART 100 UNIT/ML ~~LOC~~ SOLN: 0.0000 [IU] | Freq: Every day | SUBCUTANEOUS | Status: DC

## 2017-01-04 MED ORDER — CYCLOBENZAPRINE HCL 10 MG PO TABS
10.0000 mg | ORAL_TABLET | Freq: Two times a day (BID) | ORAL | Status: DC
  Administered 2017-01-04: 10 mg via ORAL
  Filled 2017-01-04: qty 1

## 2017-01-04 MED ORDER — ONDANSETRON HCL 4 MG PO TABS: 4.0000 mg | ORAL_TABLET | Freq: Four times a day (QID) | ORAL | Status: DC | PRN

## 2017-01-04 MED ORDER — PREDNISONE 10 MG (21) PO TBPK: ORAL_TABLET | ORAL | 0 refills | Status: DC

## 2017-01-04 MED ORDER — MAGNESIUM CITRATE PO SOLN
1.0000 | Freq: Once | ORAL | Status: DC | PRN
  Filled 2017-01-04: qty 296

## 2017-01-04 MED ORDER — LUBIPROSTONE 8 MCG PO CAPS
8.0000 ug | ORAL_CAPSULE | Freq: Two times a day (BID) | ORAL | Status: DC
  Administered 2017-01-04 (×2): 8 ug via ORAL
  Filled 2017-01-04 (×2): qty 1

## 2017-01-04 MED ORDER — SIMVASTATIN 20 MG PO TABS
20.0000 mg | ORAL_TABLET | Freq: Every day | ORAL | Status: DC
  Administered 2017-01-04: 20 mg via ORAL
  Filled 2017-01-04: qty 1

## 2017-01-04 MED ORDER — PANTOPRAZOLE SODIUM 40 MG PO TBEC
40.0000 mg | DELAYED_RELEASE_TABLET | Freq: Every day | ORAL | Status: DC
  Administered 2017-01-04: 40 mg via ORAL
  Filled 2017-01-04: qty 1

## 2017-01-04 MED ORDER — ENOXAPARIN SODIUM 40 MG/0.4ML ~~LOC~~ SOLN
40.0000 mg | SUBCUTANEOUS | Status: DC
  Administered 2017-01-04: 40 mg via SUBCUTANEOUS
  Filled 2017-01-04: qty 0.4

## 2017-01-04 MED ORDER — ONDANSETRON HCL 4 MG/2ML IJ SOLN: 4.0000 mg | Freq: Four times a day (QID) | INTRAMUSCULAR | Status: DC | PRN

## 2017-01-04 MED ORDER — POTASSIUM 99 MG PO TABS: 1.0000 | ORAL_TABLET | Freq: Every day | ORAL | Status: DC

## 2017-01-04 MED ORDER — ALBUTEROL SULFATE HFA 108 (90 BASE) MCG/ACT IN AERS: 2.0000 | INHALATION_SPRAY | Freq: Four times a day (QID) | RESPIRATORY_TRACT | 2 refills | Status: DC | PRN

## 2017-01-04 MED ORDER — BUSPIRONE HCL 5 MG PO TABS
7.5000 mg | ORAL_TABLET | Freq: Two times a day (BID) | ORAL | Status: DC
Start: 2017-01-04 — End: 2017-01-04
  Administered 2017-01-04: 7.5 mg via ORAL
  Filled 2017-01-04 (×3): qty 1

## 2017-01-04 MED ORDER — ASPIRIN EC 81 MG PO TBEC
81.0000 mg | DELAYED_RELEASE_TABLET | Freq: Every day | ORAL | Status: DC
  Administered 2017-01-04: 81 mg via ORAL
  Filled 2017-01-04: qty 1

## 2017-01-04 MED ORDER — IPRATROPIUM BROMIDE 0.02 % IN SOLN: 0.5000 mg | Freq: Four times a day (QID) | RESPIRATORY_TRACT | Status: DC | PRN

## 2017-01-04 MED ORDER — SODIUM CHLORIDE 0.9 % IV SOLN
250.0000 mL | INTRAVENOUS | Status: DC | PRN
  Administered 2017-01-04: 250 mL via INTRAVENOUS

## 2017-01-04 MED ORDER — SODIUM CHLORIDE 0.9% FLUSH
3.0000 mL | Freq: Two times a day (BID) | INTRAVENOUS | Status: DC
  Administered 2017-01-04 (×2): 3 mL via INTRAVENOUS

## 2017-01-04 MED ORDER — METOPROLOL TARTRATE 25 MG PO TABS
25.0000 mg | ORAL_TABLET | Freq: Two times a day (BID) | ORAL | Status: DC
  Administered 2017-01-04: 25 mg via ORAL
  Filled 2017-01-04: qty 1

## 2017-01-04 MED ORDER — SENNOSIDES-DOCUSATE SODIUM 8.6-50 MG PO TABS
1.0000 | ORAL_TABLET | Freq: Every evening | ORAL | Status: DC | PRN
  Administered 2017-01-04: 1 via ORAL
  Filled 2017-01-04: qty 1

## 2017-01-04 MED ORDER — ACETAMINOPHEN 650 MG RE SUPP: 650.0000 mg | Freq: Four times a day (QID) | RECTAL | Status: DC | PRN

## 2017-01-04 NOTE — Progress Notes (Signed)
*  PRELIMINARY RESULTS* Echocardiogram 2D Echocardiogram has been performed.  Cristela Blue 01/04/2017, 1:17 PM
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
## 2017-01-04 NOTE — ED Notes (Addendum)
Pt transported to  203

## 2017-01-04 NOTE — Progress Notes (Signed)
Pt resting comfortably in new bed. No signs of SOB at this time. Lungs clear. Orthostatic vitals are positive. Dizzy upon standing.   01/04/17 0220 01/04/17 0223 01/04/17 0224  Vitals  Temp 98.5 F (36.9 C) --  --   Temp Source Oral --  --   BP (!) 146/58 112/64 (!) 74/49  MAP (mmHg) 80 75 (!) 55  BP Location Left Arm Right Arm Right Arm  BP Method Automatic Automatic Automatic  Patient Position (if appropriate) Lying Sitting Standing  Pulse Rate 86 96 (!) 110  Pulse Rate Source Monitor --  --   Resp 18 --  --

## 2017-01-04 NOTE — Progress Notes (Addendum)
Patient is admitted this morning for acute bronchitis, dizziness. She says she feels better, less shortness of breath. No cough. Still has dizziness mainly with movement. And dizziness is going on for almost 1-2 weeks.  Physical examination: Alert, awake, oriented. Cardiovascular: S1, S2 regular. Lungs: Clear to auscultation, no wheeze, no rales. Not using accessory muscles of respiration. Neurologically: Alert, awake, oriented. No focal neurological deficit is observed. Assessment and plan: #1 acute bronchitis without evidence of pneumonia. Patient has no wheezing today. Likely discharge later today with prednisone, bronchodilators. Patient has no inhalers at home so I will prescribe albuterol inhaler. Patient still smokes and advised her to quit. Ct angio  chest showed no PE but significant emphysema. #2/ abnormal chest x-ray with left upper lobe nodule, CT chest is ordered because of her history of smoking. If the CT chest is negative for malignancy likely discharge home today. #3 dizziness secondary to orthostatic hypotension. Echocardiogram, ultrasound is ordered follow results. Patient is on lot of antipsychotics that can cause orthostatic hypotension and same is explained to her. D/c furosemide at discharge Discharge disposition pending the echo, carotid ultrasound,   Discussed with patient, patient's registered nurse. Time spent 25 minutes.

## 2017-01-04 NOTE — Care Management (Signed)
Placed in observation for shortness of breath most likely due to bronchitis.  Currently is not requiring supplemental oxygen.  Found to be significantly orthostatic with drop of 72 points systolic from lying to standing. Current with pcp.  No issues accessing medical care or obtaining medications

## 2017-01-04 NOTE — Discharge Planning (Signed)
Patient IV and pulse ox DCd.  RN assessment and VS revealed stability for DC to home. Discharge papers given, explained and educated.  Patient informed of suggested FU appts and appts made.  Scripts e-scribed to [employer].  Ince ready, patient will be walked to front and driven home.
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
## 2017-01-12 ENCOUNTER — Encounter: Payer: Self-pay | Admitting: Emergency Medicine

## 2017-01-12 ENCOUNTER — Observation Stay
Admission: EM | Admit: 2017-01-12 | Discharge: 2017-01-13 | Disposition: A | Discharge status: Home / Self Care | Payer: Medicare Other | Attending: Internal Medicine | Admitting: Internal Medicine

## 2017-01-12 ENCOUNTER — Emergency Department: Payer: Medicare Other

## 2017-01-12 DIAGNOSIS — Z7984 Long term (current) use of oral hypoglycemic drugs: Secondary | ICD-10-CM | POA: Insufficient documentation

## 2017-01-12 DIAGNOSIS — J449 Chronic obstructive pulmonary disease, unspecified: Secondary | ICD-10-CM | POA: Diagnosis not present

## 2017-01-12 DIAGNOSIS — Z79899 Other long term (current) drug therapy: Secondary | ICD-10-CM | POA: Insufficient documentation

## 2017-01-12 DIAGNOSIS — E039 Hypothyroidism, unspecified: Secondary | ICD-10-CM | POA: Diagnosis not present

## 2017-01-12 DIAGNOSIS — I951 Orthostatic hypotension: Secondary | ICD-10-CM | POA: Diagnosis not present

## 2017-01-12 DIAGNOSIS — I959 Hypotension, unspecified: Secondary | ICD-10-CM | POA: Diagnosis present

## 2017-01-12 DIAGNOSIS — E119 Type 2 diabetes mellitus without complications: Secondary | ICD-10-CM | POA: Diagnosis not present

## 2017-01-12 DIAGNOSIS — E860 Dehydration: Secondary | ICD-10-CM | POA: Insufficient documentation

## 2017-01-12 DIAGNOSIS — F418 Other specified anxiety disorders: Secondary | ICD-10-CM | POA: Insufficient documentation

## 2017-01-12 DIAGNOSIS — R000 Tachycardia, unspecified: Secondary | ICD-10-CM | POA: Insufficient documentation

## 2017-01-12 DIAGNOSIS — Z7982 Long term (current) use of aspirin: Secondary | ICD-10-CM | POA: Diagnosis not present

## 2017-01-12 DIAGNOSIS — F17210 Nicotine dependence, cigarettes, uncomplicated: Secondary | ICD-10-CM | POA: Insufficient documentation

## 2017-01-12 DIAGNOSIS — N3000 Acute cystitis without hematuria: Secondary | ICD-10-CM

## 2017-01-12 LAB — BASIC METABOLIC PANEL
Anion gap: 6 (ref 5–15)
BUN: 16 mg/dL (ref 6–20)
CHLORIDE: 99 mmol/L — AB (ref 101–111)
CO2: 28 mmol/L (ref 22–32)
Calcium: 8.8 mg/dL — ABNORMAL LOW (ref 8.9–10.3)
Creatinine, Ser: 0.6 mg/dL (ref 0.44–1.00)
GFR calc Af Amer: >60 mL/min (ref >60)
GFR calc non Af Amer: >60 mL/min (ref >60)
Glucose, Bld: 117 mg/dL — ABNORMAL HIGH (ref 65–99)
POTASSIUM: 3.7 mmol/L (ref 3.5–5.1)
SODIUM: 133 mmol/L — AB (ref 135–145)

## 2017-01-12 LAB — URINALYSIS, COMPLETE (UACMP) WITH MICROSCOPIC
BILIRUBIN URINE: NEGATIVE
Glucose, UA: NEGATIVE mg/dL
Hgb urine dipstick: NEGATIVE
KETONES UR: NEGATIVE mg/dL
Nitrite: POSITIVE — AB
PROTEIN: NEGATIVE mg/dL
Specific Gravity, Urine: 1.011 (ref 1.005–1.030)
pH: 7 (ref 5.0–8.0)

## 2017-01-12 LAB — CBC
HCT: 44.3 % (ref 35.0–47.0)
Hemoglobin: 14.7 g/dL (ref 12.0–16.0)
MCH: 30 pg (ref 26.0–34.0)
MCHC: 33.1 g/dL (ref 32.0–36.0)
MCV: 90.6 fL (ref 80.0–100.0)
Platelets: 257 10*3/uL (ref 150–440)
RBC: 4.89 MIL/uL (ref 3.80–5.20)
RDW: 13.9 % (ref 11.5–14.5)
WBC: 12.1 10*3/uL — ABNORMAL HIGH (ref 3.6–11.0)

## 2017-01-12 LAB — TROPONIN I
Troponin I: <0.03 ng/mL (ref <0.03)
Troponin I: <0.03 ng/mL (ref <0.03)

## 2017-01-12 MED ORDER — MIDODRINE HCL 5 MG PO TABS
5.0000 mg | ORAL_TABLET | Freq: Three times a day (TID) | ORAL | Status: DC
  Administered 2017-01-13: 09:00:00 5 mg via ORAL
  Filled 2017-01-12 (×2): qty 1

## 2017-01-12 MED ORDER — CEFTRIAXONE SODIUM 1 G IJ SOLR
1.0000 g | INTRAMUSCULAR | Status: DC
  Filled 2017-01-12: qty 10

## 2017-01-12 MED ORDER — ENOXAPARIN SODIUM 40 MG/0.4ML ~~LOC~~ SOLN
40.0000 mg | SUBCUTANEOUS | Status: DC
Start: 2017-01-12 — End: 2017-01-13
  Administered 2017-01-12: 23:00:00 40 mg via SUBCUTANEOUS
  Filled 2017-01-12: qty 0.4

## 2017-01-12 MED ORDER — SODIUM CHLORIDE 0.9 % IV BOLUS (SEPSIS)
1000.0000 mL | Freq: Once | INTRAVENOUS | Status: AC
  Administered 2017-01-12: 1000 mL via INTRAVENOUS

## 2017-01-12 MED ORDER — TIOTROPIUM BROMIDE MONOHYDRATE 18 MCG IN CAPS
18.0000 ug | ORAL_CAPSULE | Freq: Every day | RESPIRATORY_TRACT | Status: DC
  Administered 2017-01-13: 09:00:00 18 ug via RESPIRATORY_TRACT
  Filled 2017-01-12: qty 5

## 2017-01-12 MED ORDER — LUBIPROSTONE 8 MCG PO CAPS
8.0000 ug | ORAL_CAPSULE | Freq: Two times a day (BID) | ORAL | Status: DC
  Administered 2017-01-13: 8 ug via ORAL
  Filled 2017-01-12 (×2): qty 1

## 2017-01-12 MED ORDER — BUSPIRONE HCL 5 MG PO TABS
7.5000 mg | ORAL_TABLET | Freq: Two times a day (BID) | ORAL | Status: DC
  Administered 2017-01-12 – 2017-01-13 (×2): 7.5 mg via ORAL
  Filled 2017-01-12 (×3): qty 1.5

## 2017-01-12 MED ORDER — HYDROCORTISONE 2.5 % RE CREA
1.0000 "application " | TOPICAL_CREAM | Freq: Three times a day (TID) | RECTAL | Status: DC | PRN
  Filled 2017-01-12: qty 28.35

## 2017-01-12 MED ORDER — LEVOTHYROXINE SODIUM 88 MCG PO TABS
88.0000 ug | ORAL_TABLET | Freq: Every day | ORAL | Status: DC
  Administered 2017-01-13: 88 ug via ORAL
  Filled 2017-01-12: qty 1

## 2017-01-12 MED ORDER — ACETAMINOPHEN 325 MG PO TABS
650.0000 mg | ORAL_TABLET | Freq: Four times a day (QID) | ORAL | Status: DC | PRN
  Administered 2017-01-13: 10:00:00 650 mg via ORAL
  Filled 2017-01-12: qty 2

## 2017-01-12 MED ORDER — METFORMIN HCL 500 MG PO TABS
500.0000 mg | ORAL_TABLET | Freq: Every day | ORAL | Status: DC
  Administered 2017-01-13: 500 mg via ORAL
  Filled 2017-01-12: qty 1

## 2017-01-12 MED ORDER — LORAZEPAM 0.5 MG PO TABS
0.5000 mg | ORAL_TABLET | Freq: Every day | ORAL | Status: DC
  Administered 2017-01-12: 23:00:00 0.5 mg via ORAL
  Filled 2017-01-12: qty 1

## 2017-01-12 MED ORDER — BUDESONIDE 0.5 MG/2ML IN SUSP
0.5000 mg | Freq: Two times a day (BID) | RESPIRATORY_TRACT | Status: DC
  Administered 2017-01-12 – 2017-01-13 (×2): 0.5 mg via RESPIRATORY_TRACT
  Filled 2017-01-12 (×2): qty 2

## 2017-01-12 MED ORDER — IPRATROPIUM-ALBUTEROL 0.5-2.5 (3) MG/3ML IN SOLN
3.0000 mL | Freq: Four times a day (QID) | RESPIRATORY_TRACT | Status: DC
  Administered 2017-01-12 – 2017-01-13 (×4): 3 mL via RESPIRATORY_TRACT
  Filled 2017-01-12 (×4): qty 3

## 2017-01-12 MED ORDER — FLUOXETINE HCL 20 MG PO CAPS
40.0000 mg | ORAL_CAPSULE | Freq: Every day | ORAL | Status: DC
  Administered 2017-01-13: 40 mg via ORAL
  Filled 2017-01-12: qty 2

## 2017-01-12 MED ORDER — ACETAMINOPHEN 650 MG RE SUPP: 650.0000 mg | Freq: Four times a day (QID) | RECTAL | Status: DC | PRN

## 2017-01-12 MED ORDER — SIMVASTATIN 10 MG PO TABS: 20.0000 mg | ORAL_TABLET | Freq: Every day | ORAL | Status: DC

## 2017-01-12 MED ORDER — MELATONIN 5 MG PO TABS
3.0000 mg | ORAL_TABLET | Freq: Every day | ORAL | Status: DC
  Administered 2017-01-12: 2.5 mg via ORAL
  Filled 2017-01-12 (×2): qty 0.5

## 2017-01-12 MED ORDER — ASPIRIN EC 81 MG PO TBEC
81.0000 mg | DELAYED_RELEASE_TABLET | Freq: Every day | ORAL | Status: DC
  Administered 2017-01-13: 81 mg via ORAL
  Filled 2017-01-12: qty 1

## 2017-01-12 MED ORDER — METOPROLOL TARTRATE 25 MG PO TABS
25.0000 mg | ORAL_TABLET | Freq: Two times a day (BID) | ORAL | Status: DC
Start: 2017-01-12 — End: 2017-01-12

## 2017-01-12 MED ORDER — DEXTROSE 5 % IV SOLN
1.0000 g | Freq: Once | INTRAVENOUS | Status: AC
  Administered 2017-01-12: 1 g via INTRAVENOUS
  Filled 2017-01-12: qty 10

## 2017-01-12 MED ORDER — POTASSIUM CHLORIDE CRYS ER 10 MEQ PO TBCR
10.0000 meq | EXTENDED_RELEASE_TABLET | Freq: Every day | ORAL | Status: DC
  Administered 2017-01-13: 10 meq via ORAL
  Filled 2017-01-12: qty 1

## 2017-01-12 MED ORDER — SIMVASTATIN 20 MG PO TABS
20.0000 mg | ORAL_TABLET | Freq: Every day | ORAL | Status: DC
  Administered 2017-01-13: 01:00:00 20 mg via ORAL
  Filled 2017-01-12: qty 1
  Filled 2017-01-12: qty 2

## 2017-01-12 MED ORDER — SODIUM CHLORIDE 0.9 % IV SOLN
INTRAVENOUS | Status: DC
  Administered 2017-01-12: 21:00:00 via INTRAVENOUS

## 2017-01-12 MED ORDER — COSYNTROPIN 0.25 MG IJ SOLR: 0.2500 mg | Freq: Once | INTRAMUSCULAR | Status: DC

## 2017-01-12 NOTE — ED Notes (Signed)
Helped pt to bathroom. 

## 2017-01-12 NOTE — ED Notes (Signed)
Completed orthostatic VS

## 2017-01-12 NOTE — ED Provider Notes (Addendum)
[facility] Emergency Department Provider Note  ____________________________________________  Time seen: Approximately 12:47 PM  I have reviewed the triage vital signs and the nursing notes.   HISTORY  Chief Complaint Hypotension    HPI [name] is a 71 y.o. female with a history of DM, HTN, fibromyalgia, and recent hospitalization for orthostatic hypotension presenting with dizziness with standing. The patient was discharged 8/20 after diagnosis of acute bronchitis and orthostatic hypotension. Initially, she felt well until the last 2 days when she has become dizzy every time she stands up.she describes an associated "bandlike" pain in her head which resolves. She states that she has been eating and drinking normally. She had 1 day of diarrhea several days ago which completely resolved. She denies any urinary . No chest pain, shortness of breath, nausea or vomiting, fever or chills or palpitations.  On arrival to her home, EMS notes that she had a blood pressure in the 110s which dropped to the 80s over 50s with standing. Her pulse was normal and her blood sugar was also normal.   Past Medical History:  Diagnosis Date  . Anxiety   . Chest pain   . CHF (congestive heart failure) (HCC)   . Depression   . Diabetes mellitus without complication (HCC)   . Fibromyalgia   . Graves disease   . Hypertension   . IBS (irritable bowel syndrome)   . PTSD (post-traumatic stress disorder)   . Sinus problem   . Spinal stenosis     Patient Active Problem List   Diagnosis Date Noted  . Bronchitis 01/04/2017  . Unstable angina (HCC) 06/13/2015  . UTI (lower urinary tract infection) 06/13/2015  . Degenerative disc disease, lumbar 10/18/2014  . Sacroiliac joint dysfunction 10/18/2014  . Facet syndrome, lumbar (HCC) 10/18/2014  . Cervical facet joint syndrome (HCC) 10/18/2014  . Bilateral occipital neuralgia 10/18/2014  . Migraine 10/18/2014  . Neurosis,  posttraumatic 10/08/2014  . Spinal stenosis   . PTSD (post-traumatic stress disorder)     Past Surgical History:  Procedure Laterality Date  . ABDOMINAL HYSTERECTOMY    . ESOPHAGOGASTRODUODENOSCOPY (EGD) WITH PROPOFOL N/A 01/10/2015   Procedure: ESOPHAGOGASTRODUODENOSCOPY (EGD) WITH PROPOFOL;  Surgeon: Wallace Cullens, MD;  Location: [facility] ENDOSCOPY;  Service: Gastroenterology;  Laterality: N/A;  . RECTAL SURGERY    . SAVORY DILATION N/A 01/10/2015   Procedure: SAVORY DILATION;  Surgeon: Wallace Cullens, MD;  Location: [facility] ENDOSCOPY;  Service: Gastroenterology;  Laterality: N/A;  . toenail removal Bilateral    great toes  . TONSILLECTOMY AND ADENOIDECTOMY      Current Outpatient Rx  . Order #: [id number] Class: Normal  . Order #: [id number] Class: Historical Med  . Order #: [id number] Class: Historical Med  . Order #: [id number] Class: Historical Med  . Order #: [id number] Class: Historical Med  . Order #: [id number] Class: Historical Med  . Order #: [id number] Class: Historical Med  . Order #: [id number] Class: Historical Med  . Order #: [id number] Class: Historical Med  . Order #: [id number] Class: Historical Med  . Order #: [id number] Class: Historical Med  . Order #: [id number] Class: Historical Med  . Order #: [id number] Class: Historical Med  . Order #: [id number] Class: Historical Med  . Order #: [id number] Class: Historical Med  . Order #: [id number] Class: Normal  . Order #: [id number] Class: Normal  . Order #: [id number] Class: Historical Med  . Order #: [id number] Class: Print    Allergies Celecoxib and Pregabalin  Family History  Problem Relation Age of Onset  . Alcohol abuse Mother   .  Depression Mother   . Varicose Veins Mother   . Alcohol abuse Father   . Depression Father   . Alcohol abuse Sister   . Asthma Sister   . COPD Sister   . Depression Sister   . Heart disease Sister   . Hypertension Sister   . Depression Maternal Grandmother   . Diabetes Paternal Grandmother   . Stroke Paternal  Grandmother     Social History Social History  Substance Use Topics  . Smoking status: Current Some Day Smoker    Years: 25.00    Types: Cigarettes  . Smokeless tobacco: Never Used  . Alcohol use No    Review of Systems Constitutional: No fever/chills.positive lightheadedness with standing. No syncope. No trauma. Eyes: No visual changes.no blurred or double vision. ENT: No sore throat. No congestion or rhinorrhea. Cardiovascular: Denies chest pain. Denies palpitations. Respiratory: Denies shortness of breath.  No cough. Gastrointestinal: No abdominal pain.  No nausea, no vomiting.  1 day of diarrhea, now resolved.  No constipation.no dark or bloody stools. Genitourinary: Negative for dysuria.no hematuria. Musculoskeletal: Negative for back pain. Skin: Negative for rash. Neurological: Negative for headaches. No focal numbness, tingling or weakness. No gait instability. No changes in vision or speech. Denies confusion.    ____________________________________________   PHYSICAL EXAM:  VITAL SIGNS: ED Triage Vitals  Enc Vitals Group     BP 01/12/17 1239 127/73     Pulse Rate 01/12/17 1239 77     Resp 01/12/17 1239 12     Temp 01/12/17 1239 97.8 F (36.6 C)     Temp Source 01/12/17 1239 Oral     SpO2 01/12/17 1239 98 %     Weight 01/12/17 1243 134 lb (60.8 kg)     Height 01/12/17 1243 5\' 6"  (1.676 m)     Head Circumference --      Peak Flow --      Pain Score 01/12/17 1239 7     Pain Loc --      Pain Edu? --      Excl. in GC? --     Constitutional: Alert and oriented. Well appearing and in no acute distress. Answers questions appropriately.the patient is sitting comfortably in the stretcher, smiling and moving around without any difficulty. Eyes: Conjunctivae are normal.  EOMI. PERRLA.No scleral icterus. Head: Atraumatic. Nose: No congestion/rhinnorhea. Mouth/Throat: Mucous membranes are moist.  Neck: No stridor.  Supple.  No JVD. No meningismus. Cardiovascular:  Normal rate, regular rhythm. No murmurs, rubs or gallops.  Respiratory: Normal respiratory effort.  No accessory muscle use or retractions. Lungs CTAB.  No wheezes, rales or ronchi. Gastrointestinal: Soft, nontender and nondistended.  No guarding or rebound.  No peritoneal signs. Musculoskeletal: No LE edema. No ttp in the calves or palpable cords.  Negative Homan's sign. Neurologic:  A&Ox3.  Speech is clear.  Face and smile are symmetric.  EOMI.  PERRLA. No horizontal or vertical nystagmus.Moves all extremities well. Skin:  Skin is warm, dry and intact. No rash noted. Psychiatric: Mood and affect are normal. Speech and behavior are normal.  Normal judgement.  ____________________________________________   LABS (all labs ordered are listed, but only abnormal results are displayed)  Labs Reviewed  CBC - Abnormal; Notable for the following:       Result Value   WBC 12.1 (*)    All other components within normal limits  BASIC METABOLIC PANEL - Abnormal; Notable for the following:    Sodium 133 (*)    Chloride  99 (*)    Glucose, Bld 117 (*)    Calcium 8.8 (*)    All other components within normal limits  URINALYSIS, COMPLETE (UACMP) WITH MICROSCOPIC - Abnormal; Notable for the following:    Color, Urine YELLOW (*)    APPearance HAZY (*)    Nitrite POSITIVE (*)    Leukocytes, UA SMALL (*)    Bacteria, UA RARE (*)    Squamous Epithelial / LPF 6-30 (*)    All other components within normal limits  URINE CULTURE  TROPONIN I   ____________________________________________  EKG  ED ECG REPORT I, Rockne Menghini, the attending physician, personally viewed and interpreted this ECG.   Date: 01/12/2017  EKG Time: 1245  Rate: 77  Rhythm: normal sinus rhythm  Axis: normal  Intervals:none  ST&T Change: Nonspecific twave inversions in V1 and V2. No STEMI.  ____________________________________________  RADIOLOGY  Dg Chest 2 View  Result Date: 01/12/2017 CLINICAL DATA:  Ortho  static hypotension, some chest pain EXAM: CHEST  2 VIEW COMPARISON:  CT chest of 01/03/2017 and chest x-ray of 01/03/2017 FINDINGS: The lungs remain clear but hyperaerated consistent with emphysematous change. Mediastinal and hilar contours are unremarkable. The heart is within normal limits in size. No acute bony abnormality is seen. IMPRESSION: No active cardiopulmonary disease. Stable hyper aeration consistent with emphysema. Electronically Signed   By: Dwyane Dee M.D.   On: 01/12/2017 13:19    ____________________________________________   PROCEDURES  Procedure(s) performed: None  Procedures  Critical Care performed: No ____________________________________________   INITIAL IMPRESSION / ASSESSMENT AND PLAN / ED COURSE  Pertinent labs & imaging results that were available during my care of the patient were reviewed by me and considered in my medical decision making (see chart for details).  71 y.o. Female with a recent admission for orthostatic hypotension presenting with dizziness with standing and evidence of orthostatic hypotension by EMS. At discharge, the patient has not changed her eating and drinking, and it may be that she is mildly dehydrated. She did undergo ultrasound of the carotids which showed moderate disease bilaterallybut no severe disease, which may be contributing to her orthostasis.there would be no intervention for this at this time. The patient additionally has already had a CT of the head, as well as a CT of the chest to rule out PE, both of which were reassuring studies. There is no indication in terms of new or more severe symptoms to repeat this imaging. We will check for urinary tract infection, arrhythmia, abnormal electrolytes, decreased kidney function, or any other causes for her symptoms today. Plan reevaluation for final disposition.  ----------------------------------------- 5:36 PM on 01/12/2017 -----------------------------------------  After 2 L of  fluid, the patient continues to be markedly orthostatic. She has received her first dose of antibiotics for the UTI. At this time, we'll plan to admit the patient for further evaluation and treatment.  ____________________________________________  FINAL CLINICAL IMPRESSION(S) / ED DIAGNOSES  Final diagnoses:  Orthostatic hypotension  Acute cystitis without hematuria         NEW MEDICATIONS STARTED DURING THIS VISIT:  New Prescriptions   No medications on file      Rockne Menghini, MD 01/12/17 1254    Rockne Menghini, MD 01/12/17 1736

## 2017-01-12 NOTE — ED Triage Notes (Signed)
Pt to ED by EMS with c/o hypotension. Pt recently dx with hypotension. Upon arrival EMS states BP 130/52 and upon rising 80/50. Pt also has c/o of chest pain that she states has been recurrent for 10 years and sudden sharp and shooting.

## 2017-01-12 NOTE — H&P (Signed)
[facility] [place] [facility]   PATIENT NAME: [name]    MR#:  [id number]  DATE OF BIRTH:  21-Nov-1945  DATE OF ADMISSION:  01/12/2017  PRIMARY CARE PHYSICIAN: Sherron Monday, MD   REQUESTING/REFERRING PHYSICIAN: Dr. Sharma Covert  CHIEF COMPLAINT:   Chief Complaint  Patient presents with  . Hypotension    HISTORY OF PRESENT ILLNESS:  [name]  is a 71 y.o. female presents to the hospital with dizziness with standing up.she was recently in the hospital and found to be orthostatic at that time. She was sent home and did okay at home. She finished her prednisone taper. Yesterday when she got up she got dizzy. Today she had to get out of her apartment while they were doing some work. She went over to her friend's house. When it was time to go back to her apartment again she stood up and got dizzy. She states that she's been eating and drinking okay. She does have some shortness of breath and fatigue and occasional chest pain.   PAST MEDICAL HISTORY:   Past Medical History:  Diagnosis Date  . Anxiety   . Chest pain   . CHF (congestive heart failure) (HCC)   . Depression   . Diabetes mellitus without complication (HCC)   . Fibromyalgia   . Graves disease   . Hypertension   . IBS (irritable bowel syndrome)   . PTSD (post-traumatic stress disorder)   . Sinus problem   . Spinal stenosis     PAST SURGICAL HISTORY:   Past Surgical History:  Procedure Laterality Date  . ABDOMINAL HYSTERECTOMY    . ESOPHAGOGASTRODUODENOSCOPY (EGD) WITH PROPOFOL N/A 01/10/2015   Procedure: ESOPHAGOGASTRODUODENOSCOPY (EGD) WITH PROPOFOL;  Surgeon: Wallace Cullens, MD;  Location: [facility] ENDOSCOPY;  Service: Gastroenterology;  Laterality: N/A;  . RECTAL SURGERY    . SAVORY DILATION N/A 01/10/2015   Procedure: SAVORY DILATION;  Surgeon: Wallace Cullens, MD;  Location: [facility] ENDOSCOPY;  Service: Gastroenterology;  Laterality: N/A;  . toenail removal Bilateral    great toes  .  TONSILLECTOMY AND ADENOIDECTOMY      SOCIAL HISTORY:   Social History  Substance Use Topics  . Smoking status: Current Some Day Smoker    Years: 25.00    Types: Cigarettes  . Smokeless tobacco: Never Used  . Alcohol use No    FAMILY HISTORY:   Family History  Problem Relation Age of Onset  . Alcohol abuse Mother   . Depression Mother   . Varicose Veins Mother   . Alcohol abuse Father   . Depression Father   . Alcohol abuse Sister   . Asthma Sister   . COPD Sister   . Depression Sister   . Heart disease Sister   . Hypertension Sister   . Depression Maternal Grandmother   . Diabetes Paternal Grandmother   . Stroke Paternal Grandmother     DRUG ALLERGIES:   Allergies  Allergen Reactions  . Celecoxib     Other reaction(s): Other (qualifier value) Patient is allergic to [facility] and found that it caused heart failure. CHF  . Pregabalin     Other reaction(s): Localized superficial swelling of skin    REVIEW OF SYSTEMS:  CONSTITUTIONAL: No fever. Positive for chills and sweats. Positive for weight loss. Positive for fatigue.  EYES: No blurred or double vision. Wears glasses. EARS, NOSE, AND THROAT: No tinnitus or ear pain. No sore throat RESPIRATORY: No cough, some shortness of breath, nowheezing or  hemoptysis.  CARDIOVASCULAR: occasional chest pain, noorthopnea, edema.  GASTROINTESTINAL: No nausea, vomiting,  or abdominal pain. No blood in bowel movements. Occasional diarrhea GENITOURINARY: No dysuria, hematuria.  ENDOCRINE: No polyuria, nocturia, positive for hypothyroidism HEMATOLOGY: No anemia, easy bruising or bleeding SKIN: No rash or lesion. MUSCULOSKELETAL: No joint pain or arthritis.   NEUROLOGIC: No tingling, numbness, weakness.  PSYCHIATRY: No anxiety or depression.   MEDICATIONS AT HOME:   Prior to Admission medications   Medication Sig Start Date End Date Taking? Authorizing Provider  albuterol (PROVENTIL HFA;VENTOLIN HFA) 108 (90 Base) MCG/ACT  inhaler Inhale 2 puffs into the lungs every 6 (six) hours as needed for wheezing or shortness of breath. 01/04/17  Yes Katha Hamming, MD  aspirin 81 MG tablet Take 81 mg by mouth daily.   Yes [provider]  busPIRone (BUSPAR) 7.5 MG tablet Take 7.5 mg by mouth 2 (two) times daily.   Yes [provider]  cyclobenzaprine (FLEXERIL) 10 MG tablet Take 10 mg by mouth 2 (two) times daily.   Yes [provider]  FLUoxetine (PROZAC) 20 MG capsule Take 40 mg by mouth daily.   Yes [provider]  hydrocortisone (ANUSOL-HC) 2.5 % rectal cream Place 1 application rectally 3 (three) times daily as needed for hemorrhoids or itching. Reported on 12/02/2015   Yes [provider]  levothyroxine (SYNTHROID, LEVOTHROID) 88 MCG tablet Take 88 mcg by mouth daily before breakfast.    Yes [provider]  loperamide (IMODIUM) 2 MG capsule Take by mouth as needed for diarrhea or loose stools. prn   Yes [provider]  LORazepam (ATIVAN) 0.5 MG tablet Take 0.5 mg by mouth at bedtime.   Yes [provider]  lubiprostone (AMITIZA) 8 MCG capsule Take 8 mcg by mouth 2 (two) times daily with a meal.   Yes [provider]  Melatonin 3 MG TABS Take 3 mg by mouth at bedtime.   Yes [provider]  metFORMIN (GLUCOPHAGE) 500 MG tablet Take 500 mg by mouth daily with breakfast.    Yes [provider]  metoprolol tartrate (LOPRESSOR) 25 MG tablet Take 25 mg by mouth 2 (two) times daily.    Yes [provider]  Potassium 99 MG TABS Take 1 tablet by mouth daily.   Yes [provider]  simvastatin (ZOCOR) 20 MG tablet Take 20 mg by mouth daily at 6 PM.   Yes [provider]  tiotropium (SPIRIVA) 18 MCG inhalation capsule Place 18 mcg into inhaler and inhale daily. Reported on 08/07/2015    [provider]      VITAL SIGNS:  Blood pressure 117/67, pulse 90, temperature 97.8 F (36.6 C),  temperature source Oral, resp. rate (!) 23, height 5\' 6"  (1.676 m), weight 60.8 kg (134 lb), SpO2 97 %.  PHYSICAL EXAMINATION:  GENERAL:  71 y.o.-year-old patient lying in the bed with no acute distress.  EYES: Pupils equal, round, reactive to light and accommodation. No scleral icterus. Extraocular muscles intact.  HEENT: Head atraumatic, normocephalic. Oropharynx and nasopharynx clear.  NECK:  Supple, no jugular venous distention. No thyroid enlargement, no tenderness.  LUNGS: Normal breath sounds bilaterally, slight expiratory wheezing. norales,rhonchi or crepitation. No use of accessory muscles of respiration.  CARDIOVASCULAR: S1, S2 normal. No murmurs, rubs, or gallops.  ABDOMEN: Soft, nontender, nondistended. Bowel sounds present. No organomegaly or mass.  EXTREMITIES: No pedal edema, cyanosis, or clubbing.  NEUROLOGIC: Cranial nerves II through XII are intact. Muscle strength 5/5 in all  extremities. Sensation intact. Gait not checked.  PSYCHIATRIC: The patient is alert and oriented x 3.  SKIN: No rash, lesion, or ulcer.   LABORATORY PANEL:   CBC  Recent Labs Lab 01/12/17 1350  WBC 12.1*  HGB 14.7  HCT 44.3  PLT 257   ------------------------------------------------------------------------------------------------------------------  Chemistries   Recent Labs Lab 01/12/17 1350  NA 133*  K 3.7  CL 99*  CO2 28  GLUCOSE 117*  BUN 16  CREATININE 0.60  CALCIUM 8.8*   ------------------------------------------------------------------------------------------------------------------  Cardiac Enzymes  Recent Labs Lab 01/12/17 1350  TROPONINI <0.03   ------------------------------------------------------------------------------------------------------------------  RADIOLOGY:  Dg Chest 2 View  Result Date: 01/12/2017 CLINICAL DATA:  Ortho static hypotension, some chest pain EXAM: CHEST  2 VIEW COMPARISON:  CT chest of 01/03/2017 and chest x-ray of 01/03/2017  FINDINGS: The lungs remain clear but hyperaerated consistent with emphysematous change. Mediastinal and hilar contours are unremarkable. The heart is within normal limits in size. No acute bony abnormality is seen. IMPRESSION: No active cardiopulmonary disease. Stable hyper aeration consistent with emphysema. Electronically Signed   By: Dwyane Dee M.D.   On: 01/12/2017 13:19    EKG:   Normal sinus rhythm 77 bpm  IMPRESSION AND PLAN:   1. Orthostatic hypotension. Patient received 2 L of IV fluids in the ER and still orthostatic. Gentle IV fluid hydration overnight. Hold metoprolol. Start midodrine. Unable to do a cosyntropin test secondary to the patient recently having steroids. Physical therapy evaluation.TED hose ordered. Patient advised that she must stay hydrated 2. Type 2 diabetes mellitus. On Glucophage. Put on sliding scale. 3. Hypothyroidism unspecified on levothyroxin 4. COPD with slight expiratory wheeze put on nebulizer treatments and nebulizer steroids. 5. Occasional smoker. Advised she must stop smoking 6. ER physician felt that there was a urinary tract infection started Rocephin. I'm not quite sure what 0-5 white blood cells in the urine. 7. Anxiety depression continue psychiatric medications  All the records are reviewed and case discussed with ED provider. Management plans discussed with the patient, permission to speak in front friend at the bedside and they are in agreement.  CODE STATUS: full code  TOTAL TIME TAKING CARE OF THIS PATIENT: 50 minutes.    Alford Highland M.D on 01/12/2017 at 6:47 PM  Between 7am to 6pm - Pager - [phone]  After 6pm call admission pager [phone]  [facility] Office  [phone]  CC: Primary care physician; Sherron Monday, MD

## 2017-01-13 DIAGNOSIS — I951 Orthostatic hypotension: Secondary | ICD-10-CM | POA: Diagnosis not present

## 2017-01-13 LAB — BASIC METABOLIC PANEL
Anion gap: 5 (ref 5–15)
BUN: 12 mg/dL (ref 6–20)
CALCIUM: 8.2 mg/dL — AB (ref 8.9–10.3)
CO2: 25 mmol/L (ref 22–32)
CREATININE: 0.66 mg/dL (ref 0.44–1.00)
Chloride: 107 mmol/L (ref 101–111)
Glucose, Bld: 176 mg/dL — ABNORMAL HIGH (ref 65–99)
Potassium: 3.4 mmol/L — ABNORMAL LOW (ref 3.5–5.1)
SODIUM: 137 mmol/L (ref 135–145)

## 2017-01-13 LAB — CBC
HCT: 39.7 % (ref 35.0–47.0)
Hemoglobin: 13.4 g/dL (ref 12.0–16.0)
MCH: 30.8 pg (ref 26.0–34.0)
MCHC: 33.7 g/dL (ref 32.0–36.0)
MCV: 91.3 fL (ref 80.0–100.0)
PLATELETS: 219 10*3/uL (ref 150–440)
RBC: 4.35 MIL/uL (ref 3.80–5.20)
RDW: 13.6 % (ref 11.5–14.5)
WBC: 8.6 10*3/uL (ref 3.6–11.0)

## 2017-01-13 LAB — URINE CULTURE: Culture: NO GROWTH

## 2017-01-13 LAB — CORTISOL: Cortisol, Plasma: 10.2 ug/dL

## 2017-01-13 LAB — TROPONIN I

## 2017-01-13 MED ORDER — MIDODRINE HCL 5 MG PO TABS
5.0000 mg | ORAL_TABLET | Freq: Two times a day (BID) | ORAL | Status: DC
  Filled 2017-01-13: qty 1

## 2017-01-13 MED ORDER — SODIUM CHLORIDE 0.9 % IV BOLUS (SEPSIS)
500.0000 mL | Freq: Once | INTRAVENOUS | Status: AC
  Administered 2017-01-13: 500 mL via INTRAVENOUS

## 2017-01-13 MED ORDER — METOPROLOL TARTRATE 25 MG PO TABS
12.5000 mg | ORAL_TABLET | Freq: Two times a day (BID) | ORAL | 0 refills | Status: DC
Start: 2017-01-13 — End: 2019-03-14

## 2017-01-13 NOTE — Discharge Instructions (Signed)
Keep yourself well hydrated.  Take time from laying to sitting and then standing to prevent dizziness

## 2017-01-13 NOTE — Progress Notes (Signed)
Discussed discharge instructions and medications with patient. IV removed. All questions addressed. Patient transported home via car by her friend.  Orson Ape, RN

## 2017-01-13 NOTE — Care Management CC44 (Signed)
Condition Code 44 Documentation Completed  Patient Details  Name: Marja KaysBetty C Heppler MRN: [id number] Date of Birth: 12-30-1945   Condition Code 44 given:  Yes Patient signature on Condition Code 44 notice:  Yes Documentation of 2 MD's agreement:  Yes Code 44 added to claim:  Yes    Gwenette GreetBrenda S Kimi Bordeau, RN 01/13/2017, 1:31 PM

## 2017-01-14 NOTE — Discharge Summary (Signed)
[facility] [place] [facility]   PATIENT NAME: Danielle GianottiBetty Zundel    MR#:  [id number]  DATE OF BIRTH:  [birth year]  DATE OF ADMISSION:  01/12/2017 ADMITTING PHYSICIAN: Alford Highlandichard Wieting, MD  DATE OF DISCHARGE: 01/13/2017  3:10 PM  PRIMARY CARE PHYSICIAN: Sherron Mondayejan-Sie, S Ahmed, MD   ADMISSION DIAGNOSIS:  Orthostatic hypotension [I95.1] Acute cystitis without hematuria [N30.00]  DISCHARGE DIAGNOSIS:  Active Problems:   Orthostatic hypotension   SECONDARY DIAGNOSIS:   Past Medical History:  Diagnosis Date  . Anxiety   . Chest pain   . CHF (congestive heart failure) (HCC)   . Depression   . Diabetes mellitus without complication (HCC)   . Fibromyalgia   . Graves disease   . Hypertension   . IBS (irritable bowel syndrome)   . PTSD (post-traumatic stress disorder)   . Sinus problem   . Spinal stenosis      ADMITTING HISTORY  HISTORY OF PRESENT ILLNESS:  [name]  is a 71 y.o. female presents to the hospital with dizziness with standing up.she was recently in the hospital and found to be orthostatic at that time. She was sent home and did okay at home. She finished her prednisone taper. Yesterday when she got up she got dizzy. Today she had to get out of her apartment while they were doing some work. She went over to her friend's house. When it was time to go back to her apartment again she stood up and got dizzy. She states that she's been eating and drinking okay. She does have some shortness of breath and fatigue and occasional chest pain.    HOSPITAL COURSE:   * Orthostatic hypotension due to dehydration. Significant drop in blood pressure from laying to standing in the emergency room. With fluids this has improved. Today patient had orthostatics done prior to discharge and blood pressure has not dropped more than 20 mm systolic. She did get tachycardic. Advised to keep herself well-hydrated. Take time from laying to sitting and sitting to standing to avoid any  dizziness or syncopal episodes.  2. Type 2 diabetes mellitus. On Glucophage.  3. Hypothyroidism unspecified on levothyroxin TSH level normal  4. COPD with slight expiratory wheeze put on nebulizer treatments and nebulizer steroids.  7. Anxiety depression continue psychiatric medications  Patient's symptoms have resolved. She is being discharged home. Metoprolol dose has been cut in half.  CONSULTS OBTAINED:    DRUG ALLERGIES:   Allergies  Allergen Reactions  . Celecoxib     Other reaction(s): Other (qualifier value) Patient is allergic to [facility] and found that it caused heart failure. CHF  . Pregabalin     Other reaction(s): Localized superficial swelling of skin    DISCHARGE MEDICATIONS:   Discharge Medication List as of 01/13/2017  2:06 PM    CONTINUE these medications which have CHANGED   Details  metoprolol tartrate (LOPRESSOR) 25 MG tablet Take 0.5 tablets (12.5 mg total) by mouth 2 (two) times daily., Starting Wed 01/13/2017, Normal      CONTINUE these medications which have NOT CHANGED   Details  albuterol (PROVENTIL HFA;VENTOLIN HFA) 108 (90 Base) MCG/ACT inhaler Inhale 2 puffs into the lungs every 6 (six) hours as needed for wheezing or shortness of breath., Starting Mon 01/04/2017, Normal    aspirin 81 MG tablet Take 81 mg by mouth daily., Historical Med    busPIRone (BUSPAR) 7.5 MG tablet Take 7.5 mg by mouth 2 (two) times daily., Historical Med    cyclobenzaprine (  FLEXERIL) 10 MG tablet Take 10 mg by mouth 2 (two) times daily., Historical Med    FLUoxetine (PROZAC) 20 MG capsule Take 40 mg by mouth daily., Historical Med    hydrocortisone (ANUSOL-HC) 2.5 % rectal cream Place 1 application rectally 3 (three) times daily as needed for hemorrhoids or itching. Reported on 12/02/2015, Historical Med    levothyroxine (SYNTHROID, LEVOTHROID) 88 MCG tablet Take 88 mcg by mouth daily before breakfast. , Historical Med    loperamide (IMODIUM) 2 MG capsule Take by  mouth as needed for diarrhea or loose stools. prn, Historical Med    LORazepam (ATIVAN) 0.5 MG tablet Take 0.5 mg by mouth at bedtime., Historical Med    lubiprostone (AMITIZA) 8 MCG capsule Take 8 mcg by mouth 2 (two) times daily with a meal., Historical Med    Melatonin 3 MG TABS Take 3 mg by mouth at bedtime., Historical Med    metFORMIN (GLUCOPHAGE) 500 MG tablet Take 500 mg by mouth daily with breakfast. , Historical Med    Potassium 99 MG TABS Take 1 tablet by mouth daily., Historical Med    simvastatin (ZOCOR) 20 MG tablet Take 20 mg by mouth daily at 6 PM., Historical Med    tiotropium (SPIRIVA) 18 MCG inhalation capsule Place 18 mcg into inhaler and inhale daily. Reported on 08/07/2015, Historical Med        Today   VITAL SIGNS:  Blood pressure 137/60, pulse 85, temperature 99.1 F (37.3 C), temperature source Oral, resp. rate 16, height 5\' 6"  (1.676 m), weight 62.8 kg (138 lb 6.4 oz), SpO2 97 %.  I/O:  No intake or output data in the 24 hours ending 01/14/17 1333  PHYSICAL EXAMINATION:  Physical Exam  GENERAL:  71 y.o.-year-old patient lying in the bed with no acute distress.  LUNGS: Normal breath sounds bilaterally, no wheezing, rales,rhonchi or crepitation. No use of accessory muscles of respiration.  CARDIOVASCULAR: S1, S2 normal. No murmurs, rubs, or gallops.  ABDOMEN: Soft, non-tender, non-distended. Bowel sounds present. No organomegaly or mass.  NEUROLOGIC: Moves all 4 extremities. PSYCHIATRIC: The patient is alert and oriented x 3.  SKIN: No obvious rash, lesion, or ulcer.   DATA REVIEW:   CBC  Recent Labs Lab 01/13/17 0103  WBC 8.6  HGB 13.4  HCT 39.7  PLT 219    Chemistries   Recent Labs Lab 01/13/17 0103  NA 137  K 3.4*  CL 107  CO2 25  GLUCOSE 176*  BUN 12  CREATININE 0.66  CALCIUM 8.2*    Cardiac Enzymes  Recent Labs Lab 01/13/17 0103  TROPONINI <0.03    Microbiology Results  Results for orders placed or performed  during the hospital encounter of 01/12/17  Urine culture     Status: None   Collection Time: 01/12/17  1:50 PM  Result Value Ref Range Status   Specimen Description URINE, CLEAN CATCH  Final   Special Requests NONE  Final   Culture   Final    NO GROWTH Performed at [facility], [address], [place], [place] [address]    Report Status 01/13/2017 FINAL  Final    RADIOLOGY:  No results found.  Follow up with PCP in 1 week.  Management plans discussed with the patient, family and they are in agreement.  CODE STATUS:  Code Status History    Date Active Date Inactive Code Status Order ID Comments User Context   01/12/2017  6:43 PM 01/13/2017  6:19 PM Full Code  [id number]  Alford Highland, MD ED   01/04/2017  2:15 AM 01/04/2017 10:32 PM Full Code [id number]  Tonye Royalty, DO Inpatient   06/13/2015  9:28 PM 06/14/2015  7:50 PM Full Code [id number]  Adrian Saran, MD Inpatient      TOTAL TIME TAKING CARE OF THIS PATIENT ON DAY OF DISCHARGE: more than 30 minutes.   Milagros Loll R M.D on 01/14/2017 at 1:33 PM  Between 7am to 6pm - Pager - [phone]  After 6pm go to [email or web address] - password [email or web address] [facility]  [facility] [place] [facility]  Office  [phone]  CC: Primary care physician; Sherron Monday, MD  Note: This dictation was prepared with Dragon dictation along with smaller phrase technology. Any transcriptional errors that result from this process are unintentional.

## 2017-03-17 DIAGNOSIS — E782 Mixed hyperlipidemia: Secondary | ICD-10-CM | POA: Insufficient documentation

## 2017-03-17 DIAGNOSIS — I6523 Occlusion and stenosis of bilateral carotid arteries: Secondary | ICD-10-CM | POA: Insufficient documentation

## 2017-04-05 DIAGNOSIS — R0602 Shortness of breath: Secondary | ICD-10-CM | POA: Insufficient documentation

## 2017-05-21 DIAGNOSIS — J449 Chronic obstructive pulmonary disease, unspecified: Secondary | ICD-10-CM

## 2017-05-21 DIAGNOSIS — E119 Type 2 diabetes mellitus without complications: Secondary | ICD-10-CM | POA: Insufficient documentation

## 2017-09-13 IMAGING — CT CT ANGIO CHEST
2 of 6 series · 18 of 46 positions shown · IV contrast (APPLIED)
Comparison: Chest radiograph January 03, 2017 at 5304 hours

CLINICAL DATA: Increasing pain and general malaise for 3 weeks.
Shortness of breath. History of CHF, hypertension, grave's disease.

EXAM:
CT ANGIOGRAPHY CHEST WITH CONTRAST
TECHNIQUE: Multidetector CT imaging of the chest was performed using the
standard protocol during bolus administration of intravenous
contrast. Multiplanar CT image reconstructions and MIPs were
obtained to evaluate the vascular anatomy.
CONTRAST:  75 cc Isovue 370

[Series 5: thins · axial · 0.60mm/px · z∈[-278,-2]mm · 16 of 305 slices shown]
[im 14/305  lung]
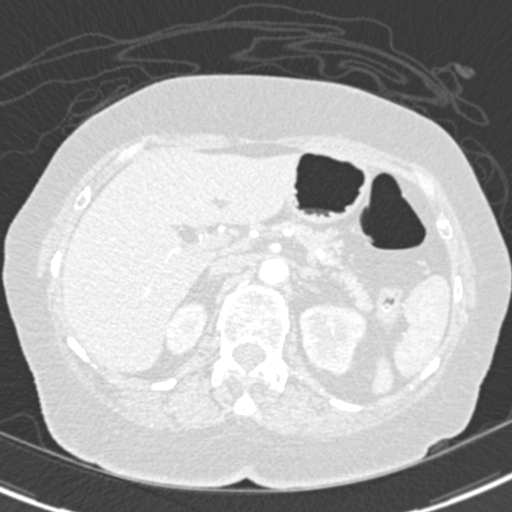
[im 40/305  soft-tissue]
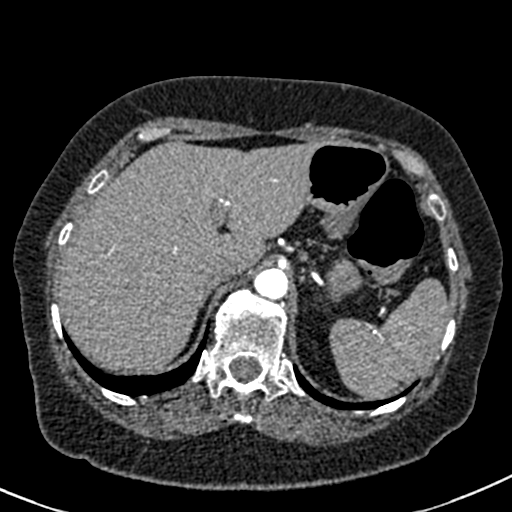
[im 53/305  lung]
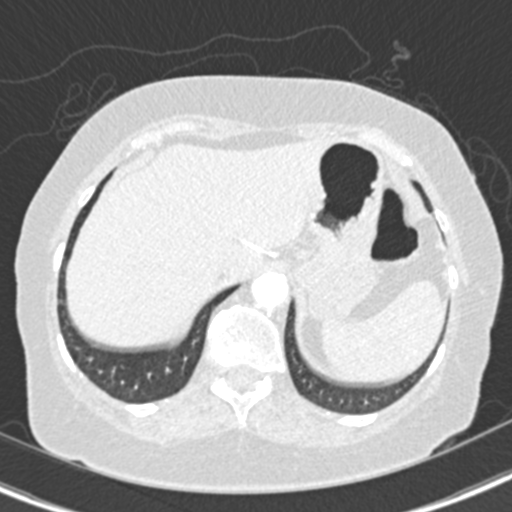
[im 67/305  soft-tissue]
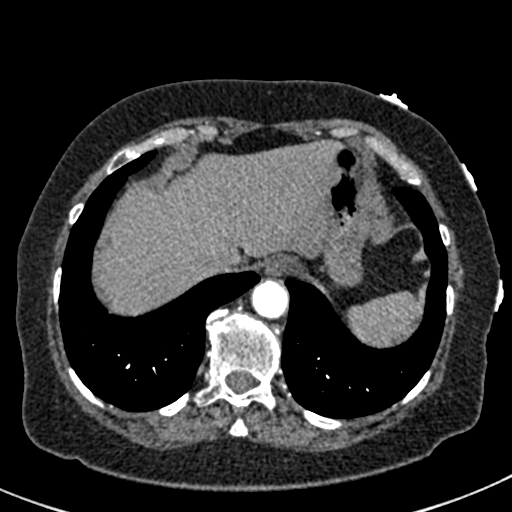
[im 93/305  lung]
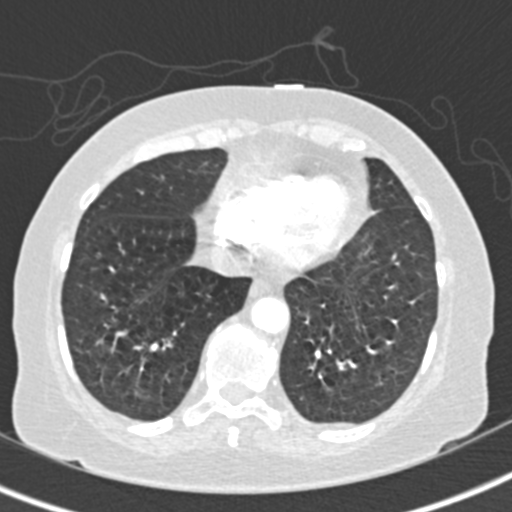
[im 106/305  soft-tissue]
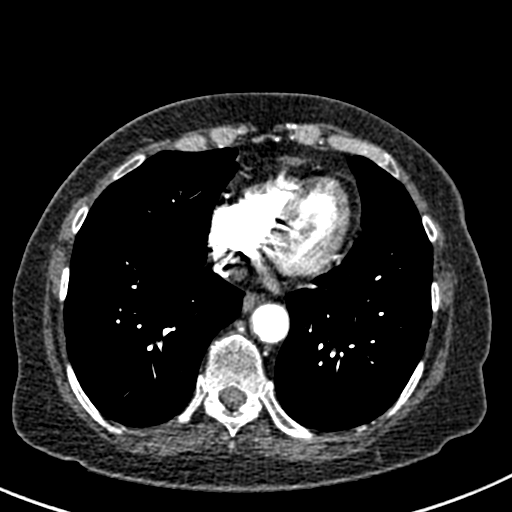
[im 119/305  lung]
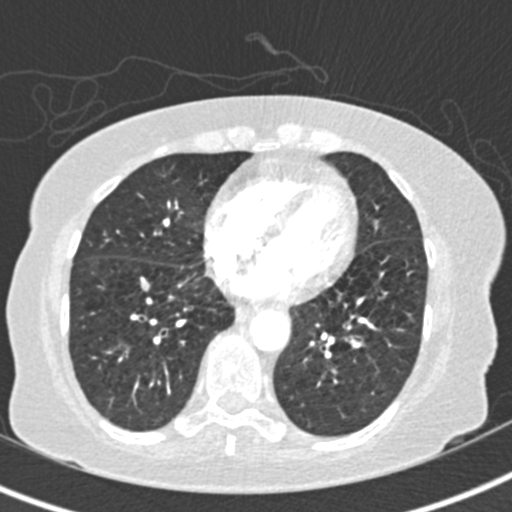
[im 146/305  soft-tissue]
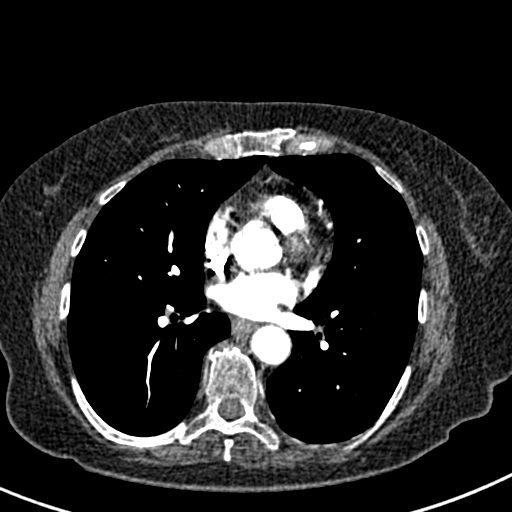
[im 159/305  lung]
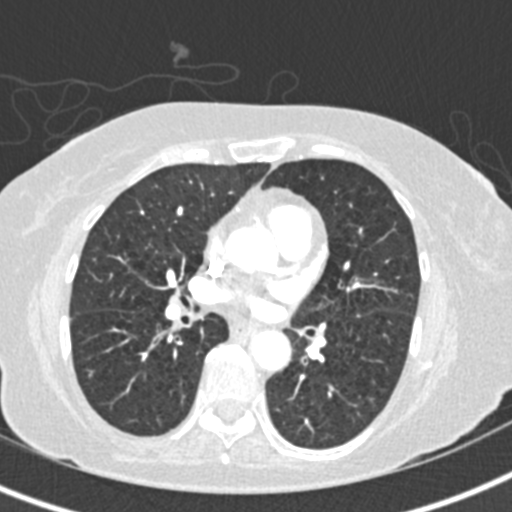
[im 186/305  soft-tissue]
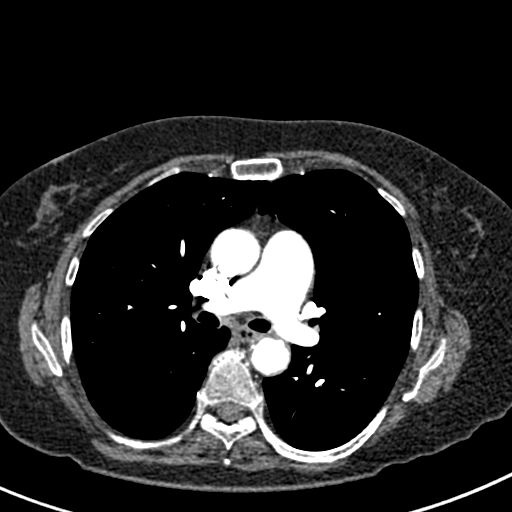
[im 199/305  lung]
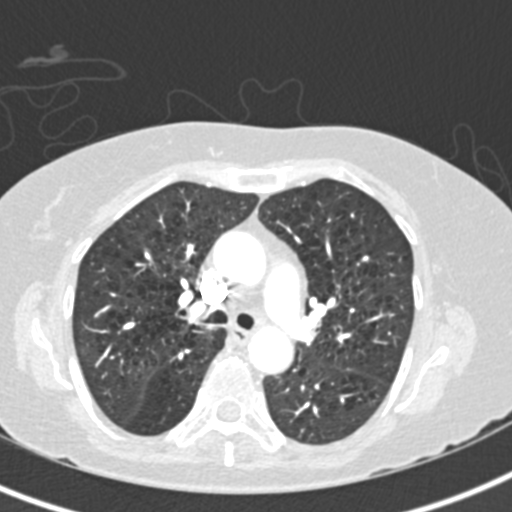
[im 212/305  soft-tissue]
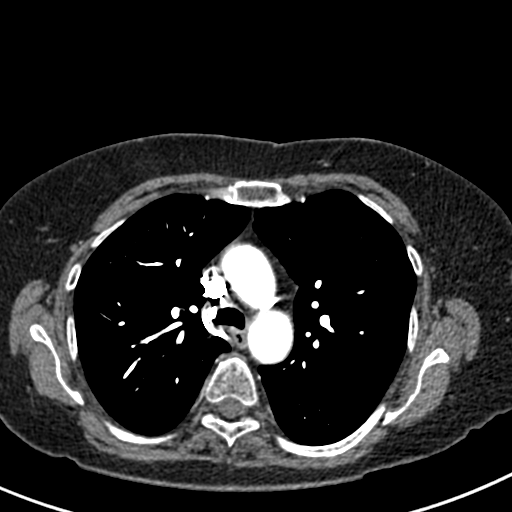
[im 238/305  lung]
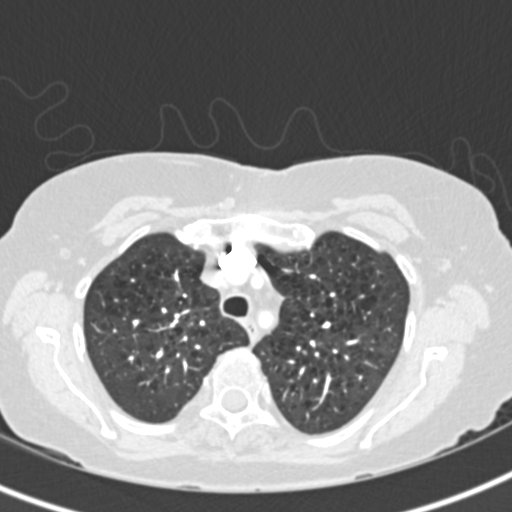
[im 252/305  soft-tissue]
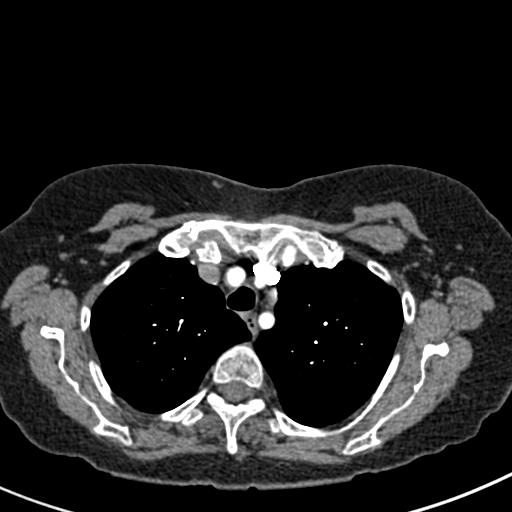
[im 265/305  lung]
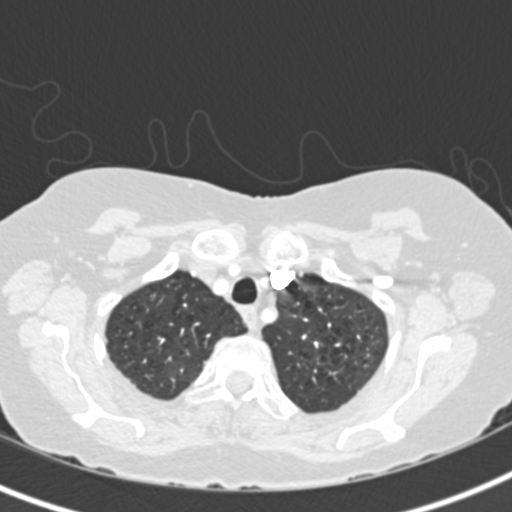
[im 291/305  soft-tissue]
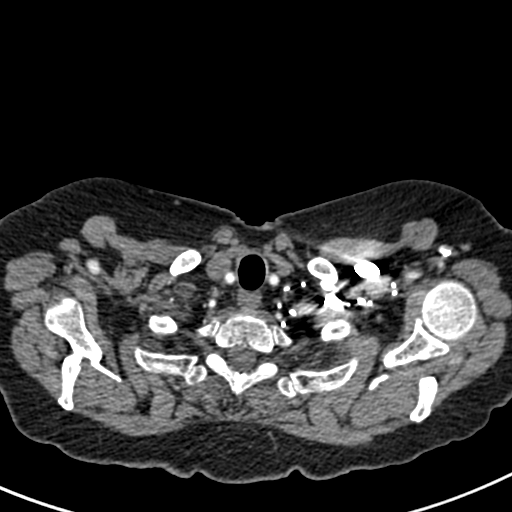

[Series 7: coronal mpr · coronal · 0.56mm/px · 2 of 77 slices shown]
[im 26/77  soft-tissue]
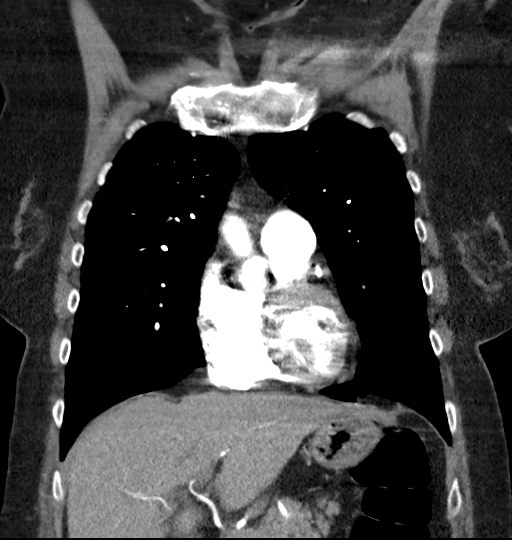
[im 51/77  soft-tissue]
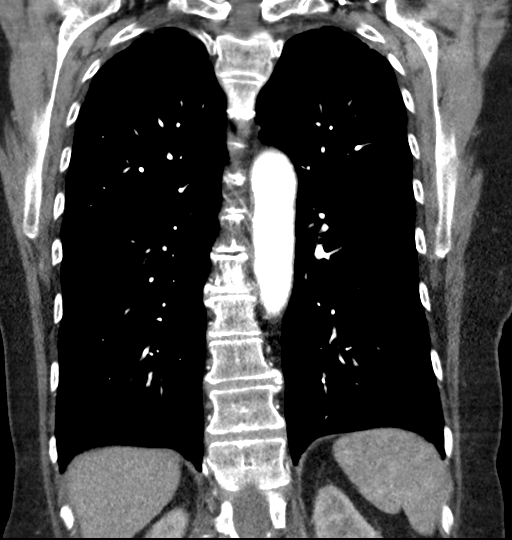

[18 of 46 positions shown; findings below may reference images not displayed]

FINDINGS: CARDIOVASCULAR: Adequate contrast opacification of the pulmonary
artery's. Main pulmonary artery is not enlarged. No pulmonary
arterial filling defects to the level of the subsegmental branches.
Heart size is normal, no right heart strain. Mild coronary artery
calcification No pericardial effusion. Thoracic aorta is normal
course and caliber, moderate calcific atherosclerosis.
Atherosclerosis resulting in at least moderate stenosis LEFT
vertebral artery origin.

MEDIASTINUM/NODES: No lymphadenopathy by CT size criteria.

LUNGS/PLEURA: Tracheobronchial tree is patent, no pneumothorax. Mild
bronchial wall thickening. Moderate to severe centrilobular
emphysema. Increased lung volumes. No pleural effusion or focal
consolidation. No pulmonary nodule or mass. Leads on anterior chest
correspond to suspected nodule on prior radiograph.

UPPER ABDOMEN: Included view of the abdomen is unremarkable.

MUSCULOSKELETAL: Visualized soft tissues and included osseous
structures are nonacute. Coarse calcification included RIGHT thyroid
bed with diminutive versus resected thyroid. Punctate calcifications
LEFT breast.

Review of the MIP images confirms the above findings.
IMPRESSION: 1. No acute pulmonary embolism.
2. Moderate to severe centrilobular emphysema.
3. Mild bronchial wall thickening seen with bronchitis and reactive
airway disease. No focal consolidation.

Aortic Atherosclerosis (QJH3A-CON.N) and Emphysema (QJH3A-EVH.I).

## 2017-09-15 DIAGNOSIS — M810 Age-related osteoporosis without current pathological fracture: Secondary | ICD-10-CM | POA: Insufficient documentation

## 2017-10-05 DIAGNOSIS — I471 Supraventricular tachycardia: Secondary | ICD-10-CM | POA: Insufficient documentation

## 2017-12-29 ENCOUNTER — Other Ambulatory Visit: Payer: Self-pay | Admitting: Family Medicine

## 2017-12-29 DIAGNOSIS — Z1231 Encounter for screening mammogram for malignant neoplasm of breast: Secondary | ICD-10-CM

## 2018-02-01 ENCOUNTER — Encounter: Payer: Medicare Other | Attending: Specialist

## 2018-02-01 VITALS — Ht 65.0 in | Wt 140.6 lb

## 2018-02-01 DIAGNOSIS — F329 Major depressive disorder, single episode, unspecified: Secondary | ICD-10-CM | POA: Insufficient documentation

## 2018-02-01 DIAGNOSIS — K589 Irritable bowel syndrome without diarrhea: Secondary | ICD-10-CM | POA: Insufficient documentation

## 2018-02-01 DIAGNOSIS — Z7984 Long term (current) use of oral hypoglycemic drugs: Secondary | ICD-10-CM | POA: Insufficient documentation

## 2018-02-01 DIAGNOSIS — Z79899 Other long term (current) drug therapy: Secondary | ICD-10-CM | POA: Insufficient documentation

## 2018-02-01 DIAGNOSIS — M797 Fibromyalgia: Secondary | ICD-10-CM | POA: Insufficient documentation

## 2018-02-01 DIAGNOSIS — F4310 Post-traumatic stress disorder, unspecified: Secondary | ICD-10-CM | POA: Insufficient documentation

## 2018-02-01 DIAGNOSIS — Z7982 Long term (current) use of aspirin: Secondary | ICD-10-CM | POA: Insufficient documentation

## 2018-02-01 DIAGNOSIS — I110 Hypertensive heart disease with heart failure: Secondary | ICD-10-CM | POA: Diagnosis not present

## 2018-02-01 DIAGNOSIS — Z79890 Hormone replacement therapy (postmenopausal): Secondary | ICD-10-CM | POA: Insufficient documentation

## 2018-02-01 DIAGNOSIS — E119 Type 2 diabetes mellitus without complications: Secondary | ICD-10-CM | POA: Diagnosis not present

## 2018-02-01 DIAGNOSIS — Z87891 Personal history of nicotine dependence: Secondary | ICD-10-CM | POA: Insufficient documentation

## 2018-02-01 DIAGNOSIS — F419 Anxiety disorder, unspecified: Secondary | ICD-10-CM | POA: Diagnosis not present

## 2018-02-01 DIAGNOSIS — I509 Heart failure, unspecified: Secondary | ICD-10-CM | POA: Insufficient documentation

## 2018-02-01 DIAGNOSIS — J449 Chronic obstructive pulmonary disease, unspecified: Secondary | ICD-10-CM | POA: Diagnosis not present

## 2018-02-01 NOTE — Patient Instructions (Signed)
Patient Instructions  Patient Details  Name: [name] MRN: [id number] Date of Birth: 05/07/1946 Referring Provider:  Mertie MooresFleming, Herbon E, MD  Below are your personal goals for exercise, nutrition, and risk factors. Our goal is to help you stay on track towards obtaining and maintaining these goals. We will be discussing your progress on these goals with you throughout the program.  Initial Exercise Prescription: Initial Exercise Prescription - 02/01/18 1500      Date of Initial Exercise RX and Referring Provider   Date  02/01/18    Referring Provider  Brett CanalesFleming, [place] MD      Treadmill   MPH  1.5    Grade  0.5    Minutes  15   may need rest breaks depending on oxygen saturations   METs  2.25      NuStep   Level  2    SPM  80    Minutes  15    METs  2      Arm Ergometer   Level  2    Watts  34    RPM  25    Minutes  15    METs  2      Prescription Details   Frequency (times per week)  3    Duration  Progress to 45 minutes of aerobic exercise without signs/symptoms of physical distress      Intensity   THRR 40-80% of Max Heartrate  124-139    Ratings of Perceived Exertion  11-13    Perceived Dyspnea  0-4      Progression   Progression  Continue to progress workloads to maintain intensity without signs/symptoms of physical distress.      Resistance Training   Training Prescription  Yes    Weight  3 lbs    Reps  10-15       Exercise Goals: Frequency: Be able to perform aerobic exercise two to three times per week in program working toward 2-5 days per week of home exercise.  Intensity: Work with a perceived exertion of 11 (fairly light) - 15 (hard) while following your exercise prescription.  We will make changes to your prescription with you as you progress through the program.   Duration: Be able to do 30 to 45 minutes of continuous aerobic exercise in addition to a 5 minute warm-up and a 5 minute cool-down routine.   Nutrition Goals: Your personal  nutrition goals will be established when you do your nutrition analysis with the dietician.  The following are general nutrition guidelines to follow: Cholesterol < 200mg /day Sodium < 1500mg /day Fiber: Women over 50 yrs - 21 grams per day  Personal Goals: Personal Goals and Risk Factors at Admission - 02/01/18 1509      Core Components/Risk Factors/Patient Goals on Admission    Weight Management  Yes    Intervention  Weight Management: Develop a combined nutrition and exercise program designed to reach desired caloric intake, while maintaining appropriate intake of nutrient and fiber, sodium and fats, and appropriate energy expenditure required for the weight goal.;Weight Management: Provide education and appropriate resources to help participant work on and attain dietary goals.;Weight Management/Obesity: Establish reasonable short term and long term weight goals.    Admit Weight  140 lb 9.6 oz (63.8 kg)    Goal Weight: Short Term  135 lb (61.2 kg)    Goal Weight: Long Term  135 lb (61.2 kg)    Expected Outcomes  Short Term: Continue to assess and  modify interventions until short term weight is achieved;Long Term: Adherence to nutrition and physical activity/exercise program aimed toward attainment of established weight goal;Weight Maintenance: Understanding of the daily nutrition guidelines, which includes 25-35% calories from fat, 7% or less cal from saturated fats, less than 200mg  cholesterol, less than 1.5gm of sodium, & 5 or more servings of fruits and vegetables daily;Understanding recommendations for meals to include 15-35% energy as protein, 25-35% energy from fat, 35-60% energy from carbohydrates, less than 200mg  of dietary cholesterol, 20-35 gm of total fiber daily;Understanding of distribution of calorie intake throughout the day with the consumption of 4-5 meals/snacks;Weight Loss: Understanding of general recommendations for a balanced deficit meal plan, which promotes 1-2 lb weight  loss per week and includes a negative energy balance of [phone] kcal/d    Improve shortness of breath with ADL's  Yes    Intervention  Provide education, individualized exercise plan and daily activity instruction to help decrease symptoms of SOB with activities of daily living.    Expected Outcomes  Long Term: Be able to perform more ADLs without symptoms or delay the onset of symptoms;Short Term: Improve cardiorespiratory fitness to achieve a reduction of symptoms when performing ADLs    Diabetes  Yes    Intervention  Provide education about signs/symptoms and action to take for hypo/hyperglycemia.;Provide education about proper nutrition, including hydration, and aerobic/resistive exercise prescription along with prescribed medications to achieve blood glucose in normal ranges: Fasting glucose 65-99 mg/dL    Expected Outcomes  Short Term: Participant verbalizes understanding of the signs/symptoms and immediate care of hyper/hypoglycemia, proper foot care and importance of medication, aerobic/resistive exercise and nutrition plan for blood glucose control.;Long Term: Attainment of HbA1C < 7%.    Lipids  Yes    Intervention  Provide education and support for participant on nutrition & aerobic/resistive exercise along with prescribed medications to achieve LDL 70mg , HDL >40mg .    Expected Outcomes  Long Term: Cholesterol controlled with medications as prescribed, with individualized exercise RX and with personalized nutrition plan. Value goals: LDL < 70mg , HDL > 40 mg.;Short Term: Participant states understanding of desired cholesterol values and is compliant with medications prescribed. Participant is following exercise prescription and nutrition guidelines.       Tobacco Use Initial Evaluation: Social History   Tobacco Use  Smoking Status Former Smoker  . Packs/day: 0.50  . Years: 25.00  . Pack years: 12.50  . Types: Cigarettes  . Last attempt to quit: 06/03/2016  . Years since quitting:  1.6  Smokeless Tobacco Never Used    Exercise Goals and Review:   Copy of goals given to participant.

## 2018-02-01 NOTE — Progress Notes (Signed)
Daily Session Note  Patient Details  Name: [name] MRN: [id number] Date of Birth: [birth year] Referring Provider:     Pulmonary Rehab from 02/01/2018 in [facility]  Referring Provider  Ancil Linsey MD      Encounter Date: 02/01/2018  Check In: Session Check In - 02/01/18 1428      Check-In   Supervising physician immediately available to respond to emergencies  [facility] immediately available ER MD    Physician(s)  Dr. Marcene Brawn and Jimmye Norman    Location  [facility]-Cardiac & Pulmonary Rehab    Staff Present  Alberteen Sam, MA, RCEP, CCRP, Exercise Physiologist;Romulus Hanrahan Tessie Fass RCP,RRT,BSRT    Medication changes reported      No    Fall or balance concerns reported     No    Warm-up and Cool-down  Not performed (comment)   medical evaluation   Resistance Training Performed  No    VAD Patient?  No      Pain Assessment   Currently in Pain?  No/denies        Exercise Prescription Changes - 02/01/18 1500      Response to Exercise   Blood Pressure (Admit)  134/80    Blood Pressure (Exercise)  186/102    Blood Pressure (Exit)  184/96   rck 134/72   Heart Rate (Admit)  108 bpm    Heart Rate (Exercise)  121 bpm    Heart Rate (Exit)  112 bpm    Oxygen Saturation (Admit)  92 %    Oxygen Saturation (Exercise)  86 %    Oxygen Saturation (Exit)  94 %    Rating of Perceived Exertion (Exercise)  13    Perceived Dyspnea (Exercise)  3    Symptoms  SOB, dizziheaded    Comments  walk test results       Social History   Tobacco Use  Smoking Status Former Smoker  . Packs/day: 0.50  . Years: 25.00  . Pack years: 12.50  . Types: Cigarettes  . Last attempt to quit: 06/03/2016  . Years since quitting: 1.6  Smokeless Tobacco Never Used    Goals Met:  Exercise tolerated well Personal goals reviewed Queuing for purse lip breathing No report of cardiac concerns or symptoms Strength training completed today  Goals Unmet:  Not Applicable  Comments:  6 Minute  Walk    Row Name 02/01/18 1509         6 Minute Walk   Phase  Initial     Distance  1006 feet     Walk Time  6 minutes     # of Rest Breaks  0     MPH  1.91     METS  3.11     RPE  13     Perceived Dyspnea   3     VO2 Peak  10.88     Symptoms  Yes (comment)     Comments  SOB, dizziness (all day)     Resting HR  108 bpm     Resting BP  134/80     Resting Oxygen Saturation   92 %     Exercise Oxygen Saturation  during 6 min walk  86 %     Max Ex. HR  121 bpm     Max Ex. BP  186/102     2 Minute Post BP  184/96 rck 134/72       Interval HR   1 Minute HR  113     2 Minute HR  119     3 Minute HR  118     4 Minute HR  120     5 Minute HR  120     6 Minute HR  121     2 Minute Post HR  112     Interval Heart Rate?  Yes       Interval Oxygen   Interval Oxygen?  Yes     Baseline Oxygen Saturation %  92 %     1 Minute Oxygen Saturation %  91 %     1 Minute Liters of Oxygen  0 L Room Air     2 Minute Oxygen Saturation %  88 %     2 Minute Liters of Oxygen  0 L     3 Minute Oxygen Saturation %  86 %     3 Minute Liters of Oxygen  0 L     4 Minute Oxygen Saturation %  86 %     4 Minute Liters of Oxygen  0 L     5 Minute Oxygen Saturation %  86 %     5 Minute Liters of Oxygen  0 L     6 Minute Oxygen Saturation %  86 %     6 Minute Liters of Oxygen  0 L     2 Minute Post Oxygen Saturation %  94 %     2 Minute Post Liters of Oxygen  0 L      Service time 1430-1530   Dr. Emily Filbert is Medical Director for [facility] and [facility].

## 2018-02-01 NOTE — Progress Notes (Signed)
Pulmonary Individual Treatment Plan  Patient Details  Name: [name] MRN: [id number] Date of Birth: February 12, 1946 Referring Provider:     Pulmonary Rehab from 02/01/2018 in [facility] Cardiac and Pulmonary Rehab  Referring Provider  Ancil Linsey MD      Initial Encounter Date:    Pulmonary Rehab from 02/01/2018 in [facility] Cardiac and Pulmonary Rehab  Date  02/01/18      Visit Diagnosis: Chronic obstructive pulmonary disease, unspecified COPD type ([place])  Patient's Home Medications on Admission:  Current Outpatient Medications:  .  albuterol (PROVENTIL HFA;VENTOLIN HFA) 108 (90 Base) MCG/ACT inhaler, Inhale 2 puffs into the lungs every 6 (six) hours as needed for wheezing or shortness of breath., Disp: 1 Inhaler, Rfl: 2 .  aspirin 81 MG tablet, Take 81 mg by mouth daily., Disp: , Rfl:  .  busPIRone (BUSPAR) 7.5 MG tablet, Take 7.5 mg by mouth 2 (two) times daily., Disp: , Rfl:  .  cyclobenzaprine (FLEXERIL) 10 MG tablet, Take 10 mg by mouth 2 (two) times daily., Disp: , Rfl:  .  FLUoxetine (PROZAC) 20 MG capsule, Take 40 mg by mouth daily., Disp: , Rfl:  .  hydrocortisone (ANUSOL-HC) 2.5 % rectal cream, Place 1 application rectally 3 (three) times daily as needed for hemorrhoids or itching. Reported on 12/02/2015, Disp: , Rfl:  .  levothyroxine (SYNTHROID, LEVOTHROID) 88 MCG tablet, Take 88 mcg by mouth daily before breakfast. , Disp: , Rfl:  .  loperamide (IMODIUM) 2 MG capsule, Take by mouth as needed for diarrhea or loose stools. prn, Disp: , Rfl:  .  LORazepam (ATIVAN) 0.5 MG tablet, Take 0.5 mg by mouth at bedtime., Disp: , Rfl:  .  lubiprostone (AMITIZA) 8 MCG capsule, Take 8 mcg by mouth 2 (two) times daily with a meal., Disp: , Rfl:  .  Melatonin 3 MG TABS, Take 3 mg by mouth at bedtime., Disp: , Rfl:  .  metFORMIN (GLUCOPHAGE) 500 MG tablet, Take 500 mg by mouth daily with breakfast. , Disp: , Rfl:  .  metoprolol tartrate (LOPRESSOR) 25 MG tablet, Take 0.5 tablets (12.5 mg total) by  mouth 2 (two) times daily., Disp: 60 tablet, Rfl: 0 .  Potassium 99 MG TABS, Take 1 tablet by mouth daily., Disp: , Rfl:  .  simvastatin (ZOCOR) 20 MG tablet, Take 20 mg by mouth daily at 6 PM., Disp: , Rfl:  .  tiotropium (SPIRIVA) 18 MCG inhalation capsule, Place 18 mcg into inhaler and inhale daily. Reported on 08/07/2015, Disp: , Rfl:   Past Medical History: Past Medical History:  Diagnosis Date  . Anxiety   . Chest pain   . CHF (congestive heart failure) ([place])   . Depression   . Diabetes mellitus without complication ([place])   . Fibromyalgia   . Graves disease   . Hypertension   . IBS (irritable bowel syndrome)   . PTSD (post-traumatic stress disorder)   . Sinus problem   . Spinal stenosis     Tobacco Use: Social History   Tobacco Use  Smoking Status Former Smoker  . Packs/day: 0.50  . Years: 25.00  . Pack years: 12.50  . Types: Cigarettes  . Last attempt to quit: 06/03/2016  . Years since quitting: 1.6  Smokeless Tobacco Never Used    Labs: Recent Review Scientist, physiological    Labs for ITP Cardiac and Pulmonary Rehab Latest Ref Rng & Units 01/04/2017   Hemoglobin A1c 4.8 - 5.6 % 6.1(H)  Pulmonary Assessment Scores: Pulmonary Assessment Scores    Row Name 02/01/18 1440         ADL UCSD   ADL Phase  Entry     SOB Score total  82     Rest  1     Walk  3     Stairs  4     Bath  4     Dress  4     Shop  5       CAT Score   CAT Score  28       mMRC Score   mMRC Score  3        Pulmonary Function Assessment: Pulmonary Function Assessment - 02/01/18 1502      Breath   Bilateral Breath Sounds  Clear    Shortness of Breath  Yes;Limiting activity       Exercise Target Goals: Exercise Program Goal: Individual exercise prescription set using results from initial 6 min walk test and THRR while considering  patient's activity barriers and safety.   Exercise Prescription Goal: Initial exercise prescription builds to 30-45 minutes a day of aerobic  activity, 2-3 days per week.  Home exercise guidelines will be given to patient during program as part of exercise prescription that the participant will acknowledge.  Activity Barriers & Risk Stratification: Activity Barriers & Cardiac Risk Stratification - 02/01/18 1511      Activity Barriers & Cardiac Risk Stratification   Activity Barriers  Shortness of Breath;Balance Concerns;Deconditioning;Muscular Weakness;Neck/Spine Problems;Back Problems;Other (comment)    Comments  Low back spinal stenosis, Frequently dizzyheaded       6 Minute Walk: 6 Minute Walk    Row Name 02/01/18 1509         6 Minute Walk   Phase  Initial     Distance  1006 feet     Walk Time  6 minutes     # of Rest Breaks  0     MPH  1.91     METS  3.11     RPE  13     Perceived Dyspnea   3     VO2 Peak  10.88     Symptoms  Yes (comment)     Comments  SOB, dizziness (all day)     Resting HR  108 bpm     Resting BP  134/80     Resting Oxygen Saturation   92 %     Exercise Oxygen Saturation  during 6 min walk  86 %     Max Ex. HR  121 bpm     Max Ex. BP  186/102     2 Minute Post BP  184/96 rck 134/72       Interval HR   1 Minute HR  113     2 Minute HR  119     3 Minute HR  118     4 Minute HR  120     5 Minute HR  120     6 Minute HR  121     2 Minute Post HR  112     Interval Heart Rate?  Yes       Interval Oxygen   Interval Oxygen?  Yes     Baseline Oxygen Saturation %  92 %     1 Minute Oxygen Saturation %  91 %     1 Minute Liters of Oxygen  0 L Room Air     2  Minute Oxygen Saturation %  88 %     2 Minute Liters of Oxygen  0 L     3 Minute Oxygen Saturation %  86 %     3 Minute Liters of Oxygen  0 L     4 Minute Oxygen Saturation %  86 %     4 Minute Liters of Oxygen  0 L     5 Minute Oxygen Saturation %  86 %     5 Minute Liters of Oxygen  0 L     6 Minute Oxygen Saturation %  86 %     6 Minute Liters of Oxygen  0 L     2 Minute Post Oxygen Saturation %  94 %     2 Minute Post  Liters of Oxygen  0 L       Oxygen Initial Assessment: Oxygen Initial Assessment - 02/01/18 1502      Home Oxygen   Home Oxygen Device  None    Sleep Oxygen Prescription  None    Home Exercise Oxygen Prescription  None    Home at Rest Exercise Oxygen Prescription  None      Initial 6 min Walk   Oxygen Used  None      Program Oxygen Prescription   Program Oxygen Prescription  None      Intervention   Short Term Goals  To learn and demonstrate proper use of respiratory medications;To learn and demonstrate proper pursed lip breathing techniques or other breathing techniques.;To learn and understand importance of maintaining oxygen saturations>88%;To learn and understand importance of monitoring SPO2 with pulse oximeter and demonstrate accurate use of the pulse oximeter.    Long  Term Goals  Verbalizes importance of monitoring SPO2 with pulse oximeter and return demonstration;Maintenance of O2 saturations>88%;Exhibits proper breathing techniques, such as pursed lip breathing or other method taught during program session;Compliance with respiratory medication;Demonstrates proper use of MDI's       Oxygen Re-Evaluation:   Oxygen Discharge (Final Oxygen Re-Evaluation):   Initial Exercise Prescription: Initial Exercise Prescription - 02/01/18 1500      Date of Initial Exercise RX and Referring Provider   Date  02/01/18    Referring Provider  Ancil Linsey MD      Treadmill   MPH  1.5    Grade  0.5    Minutes  15   may need rest breaks depending on oxygen saturations   METs  2.25      NuStep   Level  2    SPM  80    Minutes  15    METs  2      Arm Ergometer   Level  2    Watts  34    RPM  25    Minutes  15    METs  2      Prescription Details   Frequency (times per week)  3    Duration  Progress to 45 minutes of aerobic exercise without signs/symptoms of physical distress      Intensity   THRR 40-80% of Max Heartrate  124-139    Ratings of Perceived Exertion   11-13    Perceived Dyspnea  0-4      Progression   Progression  Continue to progress workloads to maintain intensity without signs/symptoms of physical distress.      Resistance Training   Training Prescription  Yes    Weight  3 lbs    Reps  10-15  Perform Capillary Blood Glucose checks as needed.  Exercise Prescription Changes: Exercise Prescription Changes    Row Name 02/01/18 1500             Response to Exercise   Blood Pressure (Admit)  134/80       Blood Pressure (Exercise)  186/102       Blood Pressure (Exit)  184/96 rck 134/72       Heart Rate (Admit)  108 bpm       Heart Rate (Exercise)  121 bpm       Heart Rate (Exit)  112 bpm       Oxygen Saturation (Admit)  92 %       Oxygen Saturation (Exercise)  86 %       Oxygen Saturation (Exit)  94 %       Rating of Perceived Exertion (Exercise)  13       Perceived Dyspnea (Exercise)  3       Symptoms  SOB, dizziheaded       Comments  walk test results          Exercise Comments:   Exercise Goals and Review:   Exercise Goals Re-Evaluation :   Discharge Exercise Prescription (Final Exercise Prescription Changes): Exercise Prescription Changes - 02/01/18 1500      Response to Exercise   Blood Pressure (Admit)  134/80    Blood Pressure (Exercise)  186/102    Blood Pressure (Exit)  184/96   rck 134/72   Heart Rate (Admit)  108 bpm    Heart Rate (Exercise)  121 bpm    Heart Rate (Exit)  112 bpm    Oxygen Saturation (Admit)  92 %    Oxygen Saturation (Exercise)  86 %    Oxygen Saturation (Exit)  94 %    Rating of Perceived Exertion (Exercise)  13    Perceived Dyspnea (Exercise)  3    Symptoms  SOB, dizziheaded    Comments  walk test results       Nutrition:  Target Goals: Understanding of nutrition guidelines, daily intake of sodium <1584m, cholesterol <2055m calories 30% from fat and 7% or less from saturated fats, daily to have 5 or more servings of fruits and vegetables.  Biometrics: Pre  Biometrics - 02/01/18 1515      Pre Biometrics   Height  '5\' 5"'  (1.651 m)    Weight  140 lb 9.6 oz (63.8 kg)    Waist Circumference  32 inches    Hip Circumference  36 inches    Waist to Hip Ratio  0.89 %    BMI (Calculated)  23.4    Single Leg Stand  9.4 seconds        Nutrition Therapy Plan and Nutrition Goals: Nutrition Therapy & Goals - 02/01/18 1505      Personal Nutrition Goals   Nutrition Goal  try to give up red meat.    Comments  She wants to learn how to eat healthier with her disease.      Intervention Plan   Intervention  Prescribe, educate and counsel regarding individualized specific dietary modifications aiming towards targeted core components such as weight, hypertension, lipid management, diabetes, heart failure and other comorbidities.;Nutrition handout(s) given to patient.    Expected Outcomes  Short Term Goal: Understand basic principles of dietary content, such as calories, fat, sodium, cholesterol and nutrients.;Long Term Goal: Adherence to prescribed nutrition plan.       Nutrition Assessments: Nutrition Assessments - 02/01/18  1441      MEDFICTS Scores   Pre Score  20       Nutrition Goals Re-Evaluation:   Nutrition Goals Discharge (Final Nutrition Goals Re-Evaluation):   Psychosocial: Target Goals: Acknowledge presence or absence of significant depression and/or stress, maximize coping skills, provide positive support system. Participant is able to verbalize types and ability to use techniques and skills needed for reducing stress and depression.   Initial Review & Psychosocial Screening: Initial Psych Review & Screening - 02/01/18 1503      Initial Review   Current issues with  Current Psychotropic Meds;Current Depression;Current Sleep Concerns;Current Stress Concerns   on prozac   Source of Stress Concerns  Chronic Illness    Comments  Her health keeps going down steadily. She wants to halt her disease.      Family Dynamics   Good Support  System?  Yes    Comments  She can look to her daugter Sharyn Lull, minister and her grandson.      Barriers   Psychosocial barriers to participate in program  The patient should benefit from training in stress management and relaxation.      Screening Interventions   Interventions  Encouraged to exercise;Program counselor consult;Provide feedback about the scores to participant;To provide support and resources with identified psychosocial needs    Expected Outcomes  Short Term goal: Utilizing psychosocial counselor, staff and physician to assist with identification of specific Stressors or current issues interfering with healing process. Setting desired goal for each stressor or current issue identified.;Long Term Goal: Stressors or current issues are controlled or eliminated.;Short Term goal: Identification and review with participant of any Quality of Life or Depression concerns found by scoring the questionnaire.;Long Term goal: The participant improves quality of Life and PHQ9 Scores as seen by post scores and/or verbalization of changes       Quality of Life Scores:  Scores of 19 and below usually indicate a poorer quality of life in these areas.  A difference of  2-3 points is a clinically meaningful difference.  A difference of 2-3 points in the total score of the Quality of Life Index has been associated with significant improvement in overall quality of life, self-image, physical symptoms, and general health in studies assessing change in quality of life.  PHQ-9: Recent Review Flowsheet Data    Depression screen [facility] 2/9 02/01/2018 01/07/2016 12/02/2015 11/06/2015 10/01/2015   Decreased Interest 0 0 0 0 0   Down, Depressed, Hopeless 1 0 0 0 1   PHQ - 2 Score 1 0 0 0 1   Altered sleeping 3 - - - -   Change in appetite 1 - - - -   Feeling bad or failure about yourself  1 - - - -   Trouble concentrating 0 - - - -   Moving slowly or fidgety/restless 0 - - - -   Suicidal thoughts 0 - - - -    PHQ-9 Score 6 - - - -   Difficult doing work/chores Somewhat difficult - - - -     Interpretation of Total Score  Total Score Depression Severity:  1-4 = Minimal depression, 5-9 = Mild depression, 10-14 = Moderate depression, 15-19 = Moderately severe depression, 20-27 = Severe depression   Psychosocial Evaluation and Intervention:   Psychosocial Re-Evaluation:   Psychosocial Discharge (Final Psychosocial Re-Evaluation):   Education: Education Goals: Education classes will be provided on a weekly basis, covering required topics. Participant will state understanding/return demonstration of topics  presented.  Learning Barriers/Preferences: Learning Barriers/Preferences - 02/01/18 1506      Learning Barriers/Preferences   Learning Barriers  Sight   wears glasses   Learning Preferences  Written Material       Education Topics:  Initial Evaluation Education: - Verbal, written and demonstration of respiratory meds, oximetry and breathing techniques. Instruction on use of nebulizers and MDIs and importance of monitoring MDI activations.   Pulmonary Rehab from 02/01/2018 in [facility] Cardiac and Pulmonary Rehab  Date  02/01/18  Educator  [facility]  Instruction Review Code  1- Verbalizes Understanding      General Nutrition Guidelines/Fats and Fiber: -Group instruction provided by verbal, written material, models and posters to present the general guidelines for heart healthy nutrition. Gives an explanation and review of dietary fats and fiber.   Controlling Sodium/Reading Food Labels: -Group verbal and written material supporting the discussion of sodium use in heart healthy nutrition. Review and explanation with models, verbal and written materials for utilization of the food label.   Exercise Physiology & General Exercise Guidelines: - Group verbal and written instruction with models to review the exercise physiology of the cardiovascular system and associated critical values. Provides  general exercise guidelines with specific guidelines to those with heart or lung disease.    Aerobic Exercise & Resistance Training: - Gives group verbal and written instruction on the various components of exercise. Focuses on aerobic and resistive training programs and the benefits of this training and how to safely progress through these programs.   Flexibility, Balance, Mind/Body Relaxation: Provides group verbal/written instruction on the benefits of flexibility and balance training, including mind/body exercise modes such as yoga, pilates and tai chi.  Demonstration and skill practice provided.   Stress and Anxiety: - Provides group verbal and written instruction about the health risks of elevated stress and causes of high stress.  Discuss the correlation between heart/lung disease and anxiety and treatment options. Review healthy ways to manage with stress and anxiety.   Depression: - Provides group verbal and written instruction on the correlation between heart/lung disease and depressed mood, treatment options, and the stigmas associated with seeking treatment.   Exercise & Equipment Safety: - Individual verbal instruction and demonstration of equipment use and safety with use of the equipment.   Pulmonary Rehab from 02/01/2018 in [facility] Cardiac and Pulmonary Rehab  Date  02/01/18  Educator  [facility]  Instruction Review Code  1- Verbalizes Understanding      Infection Prevention: - Provides verbal and written material to individual with discussion of infection control including proper hand washing and proper equipment cleaning during exercise session.   Pulmonary Rehab from 02/01/2018 in [facility] Cardiac and Pulmonary Rehab  Date  02/01/18  Educator  [facility]  Instruction Review Code  1- Verbalizes Understanding      Falls Prevention: - Provides verbal and written material to individual with discussion of falls prevention and safety.   Pulmonary Rehab from 02/01/2018 in [facility] Cardiac and  Pulmonary Rehab  Date  02/01/18  Educator  [facility]  Instruction Review Code  1- Verbalizes Understanding      Diabetes: - Individual verbal and written instruction to review signs/symptoms of diabetes, desired ranges of glucose level fasting, after meals and with exercise. Advice that pre and post exercise glucose checks will be done for 3 sessions at entry of program.   Chronic Lung Diseases: - Group verbal and written instruction to review updates, respiratory medications, advancements in procedures and treatments. Discuss use of supplemental oxygen including  available portable oxygen systems, continuous and intermittent flow rates, concentrators, personal use and safety guidelines. Review proper use of inhaler and spacers. Provide informative websites for self-education.    Energy Conservation: - Provide group verbal and written instruction for methods to conserve energy, plan and organize activities. Instruct on pacing techniques, use of adaptive equipment and posture/positioning to relieve shortness of breath.   Triggers and Exacerbations: - Group verbal and written instruction to review types of environmental triggers and ways to prevent exacerbations. Discuss weather changes, air quality and the benefits of nasal washing. Review warning signs and symptoms to help prevent infections. Discuss techniques for effective airway clearance, coughing, and vibrations.   AED/CPR: - Group verbal and written instruction with the use of models to demonstrate the basic use of the AED with the basic ABC's of resuscitation.   Anatomy and Physiology of the Lungs: - Group verbal and written instruction with the use of models to provide basic lung anatomy and physiology related to function, structure and complications of lung disease.   Anatomy & Physiology of the Heart: - Group verbal and written instruction and models provide basic cardiac anatomy and physiology, with the coronary electrical and  arterial systems. Review of Valvular disease and Heart Failure   Cardiac Medications: - Group verbal and written instruction to review commonly prescribed medications for heart disease. Reviews the medication, class of the drug, and side effects.   Know Your Numbers and Risk Factors: -Group verbal and written instruction about important numbers in your health.  Discussion of what are risk factors and how they play a role in the disease process.  Review of Cholesterol, Blood Pressure, Diabetes, and BMI and the role they play in your overall health.   Sleep Hygiene: -Provides group verbal and written instruction about how sleep can affect your health.  Define sleep hygiene, discuss sleep cycles and impact of sleep habits. Review good sleep hygiene tips.    Other: -Provides group and verbal instruction on various topics (see comments)    Knowledge Questionnaire Score: Knowledge Questionnaire Score - 02/01/18 1441      Knowledge Questionnaire Score   Pre Score  16/18   reviewed with patient       Core Components/Risk Factors/Patient Goals at Admission: Personal Goals and Risk Factors at Admission - 02/01/18 1509      Core Components/Risk Factors/Patient Goals on Admission    Weight Management  Yes    Intervention  Weight Management: Develop a combined nutrition and exercise program designed to reach desired caloric intake, while maintaining appropriate intake of nutrient and fiber, sodium and fats, and appropriate energy expenditure required for the weight goal.;Weight Management: Provide education and appropriate resources to help participant work on and attain dietary goals.;Weight Management/Obesity: Establish reasonable short term and long term weight goals.    Admit Weight  140 lb 9.6 oz (63.8 kg)    Goal Weight: Short Term  135 lb (61.2 kg)    Goal Weight: Long Term  135 lb (61.2 kg)    Expected Outcomes  Short Term: Continue to assess and modify interventions until short term  weight is achieved;Long Term: Adherence to nutrition and physical activity/exercise program aimed toward attainment of established weight goal;Weight Maintenance: Understanding of the daily nutrition guidelines, which includes 25-35% calories from fat, 7% or less cal from saturated fats, less than 268m cholesterol, less than 1.5gm of sodium, & 5 or more servings of fruits and vegetables daily;Understanding recommendations for meals to include 15-35% energy as protein,  25-35% energy from fat, 35-60% energy from carbohydrates, less than 2106m of dietary cholesterol, 20-35 gm of total fiber daily;Understanding of distribution of calorie intake throughout the day with the consumption of 4-5 meals/snacks;Weight Loss: Understanding of general recommendations for a balanced deficit meal plan, which promotes 1-2 lb weight loss per week and includes a negative energy balance of [phone] kcal/d    Improve shortness of breath with ADL's  Yes    Intervention  Provide education, individualized exercise plan and daily activity instruction to help decrease symptoms of SOB with activities of daily living.    Expected Outcomes  Long Term: Be able to perform more ADLs without symptoms or delay the onset of symptoms;Short Term: Improve cardiorespiratory fitness to achieve a reduction of symptoms when performing ADLs    Diabetes  Yes    Intervention  Provide education about signs/symptoms and action to take for hypo/hyperglycemia.;Provide education about proper nutrition, including hydration, and aerobic/resistive exercise prescription along with prescribed medications to achieve blood glucose in normal ranges: Fasting glucose 65-99 mg/dL    Expected Outcomes  Short Term: Participant verbalizes understanding of the signs/symptoms and immediate care of hyper/hypoglycemia, proper foot care and importance of medication, aerobic/resistive exercise and nutrition plan for blood glucose control.;Long Term: Attainment of HbA1C < 7%.     Lipids  Yes    Intervention  Provide education and support for participant on nutrition & aerobic/resistive exercise along with prescribed medications to achieve LDL <713m HDL >4044m   Expected Outcomes  Long Term: Cholesterol controlled with medications as prescribed, with individualized exercise RX and with personalized nutrition plan. Value goals: LDL < 74m80mDL > 40 mg.;Short Term: Participant states understanding of desired cholesterol values and is compliant with medications prescribed. Participant is following exercise prescription and nutrition guidelines.       Core Components/Risk Factors/Patient Goals Review:    Core Components/Risk Factors/Patient Goals at Discharge (Final Review):    ITP Comments: ITP Comments    Row Name 02/01/18 1429 02/01/18 1435         ITP Comments  Medical Evaluation completed. Chart sent for review and changes to Dr. MarkEmily Filbertector of [place]osis can be found in [facility] [place]  Met face to face with Dr. MarkEmily Filbertector [place] and Signed.         Comments: Initial ITP

## 2018-02-07 NOTE — Progress Notes (Signed)
Pulmonary Individual Treatment Plan  Patient Details  Name: [name] MRN: [id number] Date of Birth: [birth year] Referring Provider:     Pulmonary Rehab from 02/01/2018 in [facility] Cardiac and Pulmonary Rehab  Referring Provider  Ancil Linsey MD      Initial Encounter Date:    Pulmonary Rehab from 02/01/2018 in [facility] Cardiac and Pulmonary Rehab  Date  02/01/18      Visit Diagnosis: No diagnosis found.  Patient's Home Medications on Admission:  Current Outpatient Medications:  .  albuterol (PROVENTIL HFA;VENTOLIN HFA) 108 (90 Base) MCG/ACT inhaler, Inhale 2 puffs into the lungs every 6 (six) hours as needed for wheezing or shortness of breath., Disp: 1 Inhaler, Rfl: 2 .  aspirin 81 MG tablet, Take 81 mg by mouth daily., Disp: , Rfl:  .  busPIRone (BUSPAR) 7.5 MG tablet, Take 7.5 mg by mouth 2 (two) times daily., Disp: , Rfl:  .  cyclobenzaprine (FLEXERIL) 10 MG tablet, Take 10 mg by mouth 2 (two) times daily., Disp: , Rfl:  .  FLUoxetine (PROZAC) 20 MG capsule, Take 40 mg by mouth daily., Disp: , Rfl:  .  hydrocortisone (ANUSOL-HC) 2.5 % rectal cream, Place 1 application rectally 3 (three) times daily as needed for hemorrhoids or itching. Reported on 12/02/2015, Disp: , Rfl:  .  levothyroxine (SYNTHROID, LEVOTHROID) 88 MCG tablet, Take 88 mcg by mouth daily before breakfast. , Disp: , Rfl:  .  loperamide (IMODIUM) 2 MG capsule, Take by mouth as needed for diarrhea or loose stools. prn, Disp: , Rfl:  .  LORazepam (ATIVAN) 0.5 MG tablet, Take 0.5 mg by mouth at bedtime., Disp: , Rfl:  .  lubiprostone (AMITIZA) 8 MCG capsule, Take 8 mcg by mouth 2 (two) times daily with a meal., Disp: , Rfl:  .  Melatonin 3 MG TABS, Take 3 mg by mouth at bedtime., Disp: , Rfl:  .  metFORMIN (GLUCOPHAGE) 500 MG tablet, Take 500 mg by mouth daily with breakfast. , Disp: , Rfl:  .  metoprolol tartrate (LOPRESSOR) 25 MG tablet, Take 0.5 tablets (12.5 mg total) by mouth 2 (two) times daily., Disp: 60 tablet,  Rfl: 0 .  Potassium 99 MG TABS, Take 1 tablet by mouth daily., Disp: , Rfl:  .  simvastatin (ZOCOR) 20 MG tablet, Take 20 mg by mouth daily at 6 PM., Disp: , Rfl:  .  tiotropium (SPIRIVA) 18 MCG inhalation capsule, Place 18 mcg into inhaler and inhale daily. Reported on 08/07/2015, Disp: , Rfl:   Past Medical History: Past Medical History:  Diagnosis Date  . Anxiety   . Chest pain   . CHF (congestive heart failure) ([place])   . Depression   . Diabetes mellitus without complication ([place])   . Fibromyalgia   . Graves disease   . Hypertension   . IBS (irritable bowel syndrome)   . PTSD (post-traumatic stress disorder)   . Sinus problem   . Spinal stenosis     Tobacco Use: Social History   Tobacco Use  Smoking Status Former Smoker  . Packs/day: 0.50  . Years: 25.00  . Pack years: 12.50  . Types: Cigarettes  . Last attempt to quit: 06/03/2016  . Years since quitting: 1.6  Smokeless Tobacco Never Used    Labs: Recent Review Scientist, physiological    Labs for ITP Cardiac and Pulmonary Rehab Latest Ref Rng & Units 01/04/2017   Hemoglobin A1c 4.8 - 5.6 % 6.1(H)       Pulmonary Assessment Scores: Pulmonary Assessment  Scores    Row Name 02/01/18 1440         ADL UCSD   ADL Phase  Entry     SOB Score total  82     Rest  1     Walk  3     Stairs  4     Bath  4     Dress  4     Shop  5       CAT Score   CAT Score  28       mMRC Score   mMRC Score  3        Pulmonary Function Assessment: Pulmonary Function Assessment - 02/01/18 1502      Breath   Bilateral Breath Sounds  Clear    Shortness of Breath  Yes;Limiting activity       Exercise Target Goals: Exercise Program Goal: Individual exercise prescription set using results from initial 6 min walk test and THRR while considering  patient's activity barriers and safety.   Exercise Prescription Goal: Initial exercise prescription builds to 30-45 minutes a day of aerobic activity, 2-3 days per week.  Home exercise  guidelines will be given to patient during program as part of exercise prescription that the participant will acknowledge.  Activity Barriers & Risk Stratification: Activity Barriers & Cardiac Risk Stratification - 02/01/18 1511      Activity Barriers & Cardiac Risk Stratification   Activity Barriers  Shortness of Breath;Balance Concerns;Deconditioning;Muscular Weakness;Neck/Spine Problems;Back Problems;Other (comment)    Comments  Low back spinal stenosis, Frequently dizzyheaded       6 Minute Walk: 6 Minute Walk    Row Name 02/01/18 1509         6 Minute Walk   Phase  Initial     Distance  1006 feet     Walk Time  6 minutes     # of Rest Breaks  0     MPH  1.91     METS  3.11     RPE  13     Perceived Dyspnea   3     VO2 Peak  10.88     Symptoms  Yes (comment)     Comments  SOB, dizziness (all day)     Resting HR  108 bpm     Resting BP  134/80     Resting Oxygen Saturation   92 %     Exercise Oxygen Saturation  during 6 min walk  86 %     Max Ex. HR  121 bpm     Max Ex. BP  186/102     2 Minute Post BP  184/96 rck 134/72       Interval HR   1 Minute HR  113     2 Minute HR  119     3 Minute HR  118     4 Minute HR  120     5 Minute HR  120     6 Minute HR  121     2 Minute Post HR  112     Interval Heart Rate?  Yes       Interval Oxygen   Interval Oxygen?  Yes     Baseline Oxygen Saturation %  92 %     1 Minute Oxygen Saturation %  91 %     1 Minute Liters of Oxygen  0 L Room Air     2 Minute Oxygen Saturation %  88 %     2 Minute Liters of Oxygen  0 L     3 Minute Oxygen Saturation %  86 %     3 Minute Liters of Oxygen  0 L     4 Minute Oxygen Saturation %  86 %     4 Minute Liters of Oxygen  0 L     5 Minute Oxygen Saturation %  86 %     5 Minute Liters of Oxygen  0 L     6 Minute Oxygen Saturation %  86 %     6 Minute Liters of Oxygen  0 L     2 Minute Post Oxygen Saturation %  94 %     2 Minute Post Liters of Oxygen  0 L       Oxygen Initial  Assessment: Oxygen Initial Assessment - 02/01/18 1502      Home Oxygen   Home Oxygen Device  None    Sleep Oxygen Prescription  None    Home Exercise Oxygen Prescription  None    Home at Rest Exercise Oxygen Prescription  None      Initial 6 min Walk   Oxygen Used  None      Program Oxygen Prescription   Program Oxygen Prescription  None      Intervention   Short Term Goals  To learn and demonstrate proper use of respiratory medications;To learn and demonstrate proper pursed lip breathing techniques or other breathing techniques.;To learn and understand importance of maintaining oxygen saturations>88%;To learn and understand importance of monitoring SPO2 with pulse oximeter and demonstrate accurate use of the pulse oximeter.    Long  Term Goals  Verbalizes importance of monitoring SPO2 with pulse oximeter and return demonstration;Maintenance of O2 saturations>88%;Exhibits proper breathing techniques, such as pursed lip breathing or other method taught during program session;Compliance with respiratory medication;Demonstrates proper use of MDI's       Oxygen Re-Evaluation:   Oxygen Discharge (Final Oxygen Re-Evaluation):   Initial Exercise Prescription: Initial Exercise Prescription - 02/01/18 1500      Date of Initial Exercise RX and Referring Provider   Date  02/01/18    Referring Provider  Ancil Linsey MD      Treadmill   MPH  1.5    Grade  0.5    Minutes  15   may need rest breaks depending on oxygen saturations   METs  2.25      NuStep   Level  2    SPM  80    Minutes  15    METs  2      Arm Ergometer   Level  2    Watts  34    RPM  25    Minutes  15    METs  2      Prescription Details   Frequency (times per week)  3    Duration  Progress to 45 minutes of aerobic exercise without signs/symptoms of physical distress      Intensity   THRR 40-80% of Max Heartrate  124-139    Ratings of Perceived Exertion  11-13    Perceived Dyspnea  0-4       Progression   Progression  Continue to progress workloads to maintain intensity without signs/symptoms of physical distress.      Resistance Training   Training Prescription  Yes    Weight  3 lbs    Reps  10-15  Perform Capillary Blood Glucose checks as needed.  Exercise Prescription Changes: Exercise Prescription Changes    Row Name 02/01/18 1500             Response to Exercise   Blood Pressure (Admit)  134/80       Blood Pressure (Exercise)  186/102       Blood Pressure (Exit)  184/96 rck 134/72       Heart Rate (Admit)  108 bpm       Heart Rate (Exercise)  121 bpm       Heart Rate (Exit)  112 bpm       Oxygen Saturation (Admit)  92 %       Oxygen Saturation (Exercise)  86 %       Oxygen Saturation (Exit)  94 %       Rating of Perceived Exertion (Exercise)  13       Perceived Dyspnea (Exercise)  3       Symptoms  SOB, dizziheaded       Comments  walk test results          Exercise Comments:   Exercise Goals and Review:   Exercise Goals Re-Evaluation :   Discharge Exercise Prescription (Final Exercise Prescription Changes): Exercise Prescription Changes - 02/01/18 1500      Response to Exercise   Blood Pressure (Admit)  134/80    Blood Pressure (Exercise)  186/102    Blood Pressure (Exit)  184/96   rck 134/72   Heart Rate (Admit)  108 bpm    Heart Rate (Exercise)  121 bpm    Heart Rate (Exit)  112 bpm    Oxygen Saturation (Admit)  92 %    Oxygen Saturation (Exercise)  86 %    Oxygen Saturation (Exit)  94 %    Rating of Perceived Exertion (Exercise)  13    Perceived Dyspnea (Exercise)  3    Symptoms  SOB, dizziheaded    Comments  walk test results       Nutrition:  Target Goals: Understanding of nutrition guidelines, daily intake of sodium <1530m, cholesterol <2040m calories 30% from fat and 7% or less from saturated fats, daily to have 5 or more servings of fruits and vegetables.  Biometrics: Pre Biometrics - 02/01/18 1515      Pre  Biometrics   Height  '5\' 5"'  (1.651 m)    Weight  140 lb 9.6 oz (63.8 kg)    Waist Circumference  32 inches    Hip Circumference  36 inches    Waist to Hip Ratio  0.89 %    BMI (Calculated)  23.4    Single Leg Stand  9.4 seconds        Nutrition Therapy Plan and Nutrition Goals: Nutrition Therapy & Goals - 02/01/18 1505      Personal Nutrition Goals   Nutrition Goal  try to give up red meat.    Comments  She wants to learn how to eat healthier with her disease.      Intervention Plan   Intervention  Prescribe, educate and counsel regarding individualized specific dietary modifications aiming towards targeted core components such as weight, hypertension, lipid management, diabetes, heart failure and other comorbidities.;Nutrition handout(s) given to patient.    Expected Outcomes  Short Term Goal: Understand basic principles of dietary content, such as calories, fat, sodium, cholesterol and nutrients.;Long Term Goal: Adherence to prescribed nutrition plan.       Nutrition Assessments: Nutrition Assessments - 02/01/18  1441      MEDFICTS Scores   Pre Score  20       Nutrition Goals Re-Evaluation:   Nutrition Goals Discharge (Final Nutrition Goals Re-Evaluation):   Psychosocial: Target Goals: Acknowledge presence or absence of significant depression and/or stress, maximize coping skills, provide positive support system. Participant is able to verbalize types and ability to use techniques and skills needed for reducing stress and depression.   Initial Review & Psychosocial Screening: Initial Psych Review & Screening - 02/01/18 1503      Initial Review   Current issues with  Current Psychotropic Meds;Current Depression;Current Sleep Concerns;Current Stress Concerns   on prozac   Source of Stress Concerns  Chronic Illness    Comments  Her health keeps going down steadily. She wants to halt her disease.      Family Dynamics   Good Support System?  Yes    Comments  She can  look to her daugter Sharyn Lull, minister and her grandson.      Barriers   Psychosocial barriers to participate in program  The patient should benefit from training in stress management and relaxation.      Screening Interventions   Interventions  Encouraged to exercise;Program counselor consult;Provide feedback about the scores to participant;To provide support and resources with identified psychosocial needs    Expected Outcomes  Short Term goal: Utilizing psychosocial counselor, staff and physician to assist with identification of specific Stressors or current issues interfering with healing process. Setting desired goal for each stressor or current issue identified.;Long Term Goal: Stressors or current issues are controlled or eliminated.;Short Term goal: Identification and review with participant of any Quality of Life or Depression concerns found by scoring the questionnaire.;Long Term goal: The participant improves quality of Life and PHQ9 Scores as seen by post scores and/or verbalization of changes       Quality of Life Scores:  Scores of 19 and below usually indicate a poorer quality of life in these areas.  A difference of  2-3 points is a clinically meaningful difference.  A difference of 2-3 points in the total score of the Quality of Life Index has been associated with significant improvement in overall quality of life, self-image, physical symptoms, and general health in studies assessing change in quality of life.  PHQ-9: Recent Review Flowsheet Data    Depression screen [facility] 2/9 02/01/2018 01/07/2016 12/02/2015 11/06/2015 10/01/2015   Decreased Interest 0 0 0 0 0   Down, Depressed, Hopeless 1 0 0 0 1   PHQ - 2 Score 1 0 0 0 1   Altered sleeping 3 - - - -   Change in appetite 1 - - - -   Feeling bad or failure about yourself  1 - - - -   Trouble concentrating 0 - - - -   Moving slowly or fidgety/restless 0 - - - -   Suicidal thoughts 0 - - - -   PHQ-9 Score 6 - - - -   Difficult  doing work/chores Somewhat difficult - - - -     Interpretation of Total Score  Total Score Depression Severity:  1-4 = Minimal depression, 5-9 = Mild depression, 10-14 = Moderate depression, 15-19 = Moderately severe depression, 20-27 = Severe depression   Psychosocial Evaluation and Intervention:   Psychosocial Re-Evaluation:   Psychosocial Discharge (Final Psychosocial Re-Evaluation):   Education: Education Goals: Education classes will be provided on a weekly basis, covering required topics. Participant will state understanding/return demonstration of topics  presented.  Learning Barriers/Preferences: Learning Barriers/Preferences - 02/01/18 1506      Learning Barriers/Preferences   Learning Barriers  Sight   wears glasses   Learning Preferences  Written Material       Education Topics:  Initial Evaluation Education: - Verbal, written and demonstration of respiratory meds, oximetry and breathing techniques. Instruction on use of nebulizers and MDIs and importance of monitoring MDI activations.   Pulmonary Rehab from 02/01/2018 in [facility] Cardiac and Pulmonary Rehab  Date  02/01/18  Educator  [facility]  Instruction Review Code  1- Verbalizes Understanding      General Nutrition Guidelines/Fats and Fiber: -Group instruction provided by verbal, written material, models and posters to present the general guidelines for heart healthy nutrition. Gives an explanation and review of dietary fats and fiber.   Controlling Sodium/Reading Food Labels: -Group verbal and written material supporting the discussion of sodium use in heart healthy nutrition. Review and explanation with models, verbal and written materials for utilization of the food label.   Exercise Physiology & General Exercise Guidelines: - Group verbal and written instruction with models to review the exercise physiology of the cardiovascular system and associated critical values. Provides general exercise guidelines with  specific guidelines to those with heart or lung disease.    Aerobic Exercise & Resistance Training: - Gives group verbal and written instruction on the various components of exercise. Focuses on aerobic and resistive training programs and the benefits of this training and how to safely progress through these programs.   Flexibility, Balance, Mind/Body Relaxation: Provides group verbal/written instruction on the benefits of flexibility and balance training, including mind/body exercise modes such as yoga, pilates and tai chi.  Demonstration and skill practice provided.   Stress and Anxiety: - Provides group verbal and written instruction about the health risks of elevated stress and causes of high stress.  Discuss the correlation between heart/lung disease and anxiety and treatment options. Review healthy ways to manage with stress and anxiety.   Depression: - Provides group verbal and written instruction on the correlation between heart/lung disease and depressed mood, treatment options, and the stigmas associated with seeking treatment.   Exercise & Equipment Safety: - Individual verbal instruction and demonstration of equipment use and safety with use of the equipment.   Pulmonary Rehab from 02/01/2018 in [facility] Cardiac and Pulmonary Rehab  Date  02/01/18  Educator  [facility]  Instruction Review Code  1- Verbalizes Understanding      Infection Prevention: - Provides verbal and written material to individual with discussion of infection control including proper hand washing and proper equipment cleaning during exercise session.   Pulmonary Rehab from 02/01/2018 in [facility] Cardiac and Pulmonary Rehab  Date  02/01/18  Educator  [facility]  Instruction Review Code  1- Verbalizes Understanding      Falls Prevention: - Provides verbal and written material to individual with discussion of falls prevention and safety.   Pulmonary Rehab from 02/01/2018 in [facility] Cardiac and Pulmonary Rehab  Date  02/01/18   Educator  [facility]  Instruction Review Code  1- Verbalizes Understanding      Diabetes: - Individual verbal and written instruction to review signs/symptoms of diabetes, desired ranges of glucose level fasting, after meals and with exercise. Advice that pre and post exercise glucose checks will be done for 3 sessions at entry of program.   Chronic Lung Diseases: - Group verbal and written instruction to review updates, respiratory medications, advancements in procedures and treatments. Discuss use of supplemental oxygen including  available portable oxygen systems, continuous and intermittent flow rates, concentrators, personal use and safety guidelines. Review proper use of inhaler and spacers. Provide informative websites for self-education.    Energy Conservation: - Provide group verbal and written instruction for methods to conserve energy, plan and organize activities. Instruct on pacing techniques, use of adaptive equipment and posture/positioning to relieve shortness of breath.   Triggers and Exacerbations: - Group verbal and written instruction to review types of environmental triggers and ways to prevent exacerbations. Discuss weather changes, air quality and the benefits of nasal washing. Review warning signs and symptoms to help prevent infections. Discuss techniques for effective airway clearance, coughing, and vibrations.   AED/CPR: - Group verbal and written instruction with the use of models to demonstrate the basic use of the AED with the basic ABC's of resuscitation.   Anatomy and Physiology of the Lungs: - Group verbal and written instruction with the use of models to provide basic lung anatomy and physiology related to function, structure and complications of lung disease.   Anatomy & Physiology of the Heart: - Group verbal and written instruction and models provide basic cardiac anatomy and physiology, with the coronary electrical and arterial systems. Review of Valvular  disease and Heart Failure   Cardiac Medications: - Group verbal and written instruction to review commonly prescribed medications for heart disease. Reviews the medication, class of the drug, and side effects.   Know Your Numbers and Risk Factors: -Group verbal and written instruction about important numbers in your health.  Discussion of what are risk factors and how they play a role in the disease process.  Review of Cholesterol, Blood Pressure, Diabetes, and BMI and the role they play in your overall health.   Sleep Hygiene: -Provides group verbal and written instruction about how sleep can affect your health.  Define sleep hygiene, discuss sleep cycles and impact of sleep habits. Review good sleep hygiene tips.    Other: -Provides group and verbal instruction on various topics (see comments)    Knowledge Questionnaire Score: Knowledge Questionnaire Score - 02/01/18 1441      Knowledge Questionnaire Score   Pre Score  16/18   reviewed with patient       Core Components/Risk Factors/Patient Goals at Admission: Personal Goals and Risk Factors at Admission - 02/01/18 1509      Core Components/Risk Factors/Patient Goals on Admission    Weight Management  Yes    Intervention  Weight Management: Develop a combined nutrition and exercise program designed to reach desired caloric intake, while maintaining appropriate intake of nutrient and fiber, sodium and fats, and appropriate energy expenditure required for the weight goal.;Weight Management: Provide education and appropriate resources to help participant work on and attain dietary goals.;Weight Management/Obesity: Establish reasonable short term and long term weight goals.    Admit Weight  140 lb 9.6 oz (63.8 kg)    Goal Weight: Short Term  135 lb (61.2 kg)    Goal Weight: Long Term  135 lb (61.2 kg)    Expected Outcomes  Short Term: Continue to assess and modify interventions until short term weight is achieved;Long Term:  Adherence to nutrition and physical activity/exercise program aimed toward attainment of established weight goal;Weight Maintenance: Understanding of the daily nutrition guidelines, which includes 25-35% calories from fat, 7% or less cal from saturated fats, less than 257m cholesterol, less than 1.5gm of sodium, & 5 or more servings of fruits and vegetables daily;Understanding recommendations for meals to include 15-35% energy as protein,  25-35% energy from fat, 35-60% energy from carbohydrates, less than 277m of dietary cholesterol, 20-35 gm of total fiber daily;Understanding of distribution of calorie intake throughout the day with the consumption of 4-5 meals/snacks;Weight Loss: Understanding of general recommendations for a balanced deficit meal plan, which promotes 1-2 lb weight loss per week and includes a negative energy balance of [phone] kcal/d    Improve shortness of breath with ADL's  Yes    Intervention  Provide education, individualized exercise plan and daily activity instruction to help decrease symptoms of SOB with activities of daily living.    Expected Outcomes  Long Term: Be able to perform more ADLs without symptoms or delay the onset of symptoms;Short Term: Improve cardiorespiratory fitness to achieve a reduction of symptoms when performing ADLs    Diabetes  Yes    Intervention  Provide education about signs/symptoms and action to take for hypo/hyperglycemia.;Provide education about proper nutrition, including hydration, and aerobic/resistive exercise prescription along with prescribed medications to achieve blood glucose in normal ranges: Fasting glucose 65-99 mg/dL    Expected Outcomes  Short Term: Participant verbalizes understanding of the signs/symptoms and immediate care of hyper/hypoglycemia, proper foot care and importance of medication, aerobic/resistive exercise and nutrition plan for blood glucose control.;Long Term: Attainment of HbA1C < 7%.    Lipids  Yes    Intervention   Provide education and support for participant on nutrition & aerobic/resistive exercise along with prescribed medications to achieve LDL <751m HDL >4019m   Expected Outcomes  Long Term: Cholesterol controlled with medications as prescribed, with individualized exercise RX and with personalized nutrition plan. Value goals: LDL < 8m71mDL > 40 mg.;Short Term: Participant states understanding of desired cholesterol values and is compliant with medications prescribed. Participant is following exercise prescription and nutrition guidelines.       Core Components/Risk Factors/Patient Goals Review:    Core Components/Risk Factors/Patient Goals at Discharge (Final Review):    ITP Comments: ITP Comments    Row Name 02/01/18 1429 02/01/18 1435         ITP Comments  Medical Evaluation completed. Chart sent for review and changes to Dr. MarkEmily Filbertector of [facility]agnosis can be found [facility]  Met face to face with Dr. MarkEmily [facility]bertector [facility] reviewed and Signed.         Comments: 30 day review

## 2018-02-14 DIAGNOSIS — J449 Chronic obstructive pulmonary disease, unspecified: Secondary | ICD-10-CM | POA: Diagnosis not present

## 2018-02-14 LAB — GLUCOSE, CAPILLARY
GLUCOSE-CAPILLARY: 138 mg/dL — AB (ref 70–99)
Glucose-Capillary: 108 mg/dL — ABNORMAL HIGH (ref 70–99)

## 2018-02-14 NOTE — Progress Notes (Signed)
Daily Session Note  Patient Details  Name: [name] MRN: [id number] Date of Birth: Oct 13, 1945 Referring Provider:     Pulmonary Rehab from 02/01/2018 in [facility] Cardiac and Pulmonary Rehab  Referring Provider  Ancil Linsey MD      Encounter Date: 02/14/2018  Check In:      Social History   Tobacco Use  Smoking Status Former Smoker  . Packs/day: 0.50  . Years: 25.00  . Pack years: 12.50  . Types: Cigarettes  . Last attempt to quit: 06/03/2016  . Years since quitting: 1.7  Smokeless Tobacco Never Used    Goals Met:  Proper associated with RPD/PD & O2 Sat Exercise tolerated well Strength training completed today  Goals Unmet:  Not Applicable  Comments: First full day of exercise!  Patient was oriented to gym and equipment including functions, settings, policies, and procedures.  Patient's individual exercise prescription and treatment plan were reviewed.  All starting workloads were established based on the results of the 6 minute walk test done at initial orientation visit.  The plan for exercise progression was also introduced and progression will be customized based on patient's performance and goals.    Dr. Emily Filbert is Medical Director for [facility] and [facility].

## 2018-02-16 ENCOUNTER — Encounter: Payer: Medicare Other | Attending: Specialist | Admitting: *Deleted

## 2018-02-16 DIAGNOSIS — Z87891 Personal history of nicotine dependence: Secondary | ICD-10-CM | POA: Insufficient documentation

## 2018-02-16 DIAGNOSIS — E119 Type 2 diabetes mellitus without complications: Secondary | ICD-10-CM | POA: Insufficient documentation

## 2018-02-16 DIAGNOSIS — F4310 Post-traumatic stress disorder, unspecified: Secondary | ICD-10-CM | POA: Insufficient documentation

## 2018-02-16 DIAGNOSIS — Z79890 Hormone replacement therapy (postmenopausal): Secondary | ICD-10-CM | POA: Insufficient documentation

## 2018-02-16 DIAGNOSIS — F419 Anxiety disorder, unspecified: Secondary | ICD-10-CM | POA: Diagnosis not present

## 2018-02-16 DIAGNOSIS — Z79899 Other long term (current) drug therapy: Secondary | ICD-10-CM | POA: Diagnosis not present

## 2018-02-16 DIAGNOSIS — K589 Irritable bowel syndrome without diarrhea: Secondary | ICD-10-CM | POA: Insufficient documentation

## 2018-02-16 DIAGNOSIS — F329 Major depressive disorder, single episode, unspecified: Secondary | ICD-10-CM | POA: Insufficient documentation

## 2018-02-16 DIAGNOSIS — M797 Fibromyalgia: Secondary | ICD-10-CM | POA: Insufficient documentation

## 2018-02-16 DIAGNOSIS — J449 Chronic obstructive pulmonary disease, unspecified: Secondary | ICD-10-CM | POA: Insufficient documentation

## 2018-02-16 DIAGNOSIS — I509 Heart failure, unspecified: Secondary | ICD-10-CM | POA: Insufficient documentation

## 2018-02-16 DIAGNOSIS — Z7984 Long term (current) use of oral hypoglycemic drugs: Secondary | ICD-10-CM | POA: Insufficient documentation

## 2018-02-16 DIAGNOSIS — I110 Hypertensive heart disease with heart failure: Secondary | ICD-10-CM | POA: Insufficient documentation

## 2018-02-16 DIAGNOSIS — Z7982 Long term (current) use of aspirin: Secondary | ICD-10-CM | POA: Diagnosis not present

## 2018-02-16 LAB — GLUCOSE, CAPILLARY
GLUCOSE-CAPILLARY: 98 mg/dL (ref 70–99)
Glucose-Capillary: 112 mg/dL — ABNORMAL HIGH (ref 70–99)

## 2018-02-16 NOTE — Progress Notes (Signed)
Daily Session Note  Patient Details  Name: Santiaga C Mccorry MRN: [id number] Date of Birth: 09/13/1945 Referring Provider:     Pulmonary Rehab from 02/01/2018 in [facility] Cardiac and Pulmonary Rehab  Referring Provider  Fleming, He[place] MD      Encounter Date: 02/16/2018  Check In: Session Check In - 02/16/18 1130      Check-In   Supervising physician immediately available to respond to emergencies  [facility] immediately available ER MD    Physician(s)  Dr. Stafford and Williams    Location  [facility]-Cardiac & Pulmonary Rehab    Staff Present   , RN BSN;Jessica Hawkins, MA, RCEP, CCRP, Exercise Physiologist;Joseph Hood RCP,RRT,BSRT    Medication changes reported      No    Fall or balance concerns reported     No    Warm-up and Cool-down  Performed as group-led instruction    Resistance Training Performed  Yes    VAD Patient?  No    PAD/SET Patient?  No      Pain Assessment   Currently in Pain?  No/denies          Social History   Tobacco Use  Smoking Status Former Smoker  . Packs/day: 0.50  . Years: 25.00  . Pack years: 12.50  . Types: Cigarettes  . Last attempt to quit: 06/03/2016  . Years since quitting: 1.7  Smokeless Tobacco Never Used    Goals Met:  Proper associated with RPD/PD & O2 Sat Independence with exercise equipment Using PLB without cueing & demonstrates good technique Exercise tolerated well No report of cardiac concerns or symptoms Strength training completed today  Goals Unmet:  Not Applicable  Comments: Pt able to follow exercise prescription today without complaint.  Will continue to monitor for progression.    Dr. Mark Miller is Medical Director for [facility] and [facility]. 

## 2018-03-01 ENCOUNTER — Telehealth: Payer: Self-pay

## 2018-03-01 DIAGNOSIS — J449 Chronic obstructive pulmonary disease, unspecified: Secondary | ICD-10-CM

## 2018-03-01 NOTE — Progress Notes (Signed)
Pulmonary Individual Treatment Plan  Patient Details  Name: [name] MRN: [id number] Date of Birth: March 29, 1946 Referring Provider:     Pulmonary Rehab from 02/01/2018 in [facility] Cardiac and Pulmonary Rehab  Referring Provider  Ancil Linsey MD      Initial Encounter Date:    Pulmonary Rehab from 02/01/2018 in [facility] Cardiac and Pulmonary Rehab  Date  02/01/18      Visit Diagnosis: Chronic obstructive pulmonary disease, unspecified COPD type ([place])  Patient's Home Medications on Admission:  Current Outpatient Medications:  .  albuterol (PROVENTIL HFA;VENTOLIN HFA) 108 (90 Base) MCG/ACT inhaler, Inhale 2 puffs into the lungs every 6 (six) hours as needed for wheezing or shortness of breath., Disp: 1 Inhaler, Rfl: 2 .  aspirin 81 MG tablet, Take 81 mg by mouth daily., Disp: , Rfl:  .  busPIRone (BUSPAR) 7.5 MG tablet, Take 7.5 mg by mouth 2 (two) times daily., Disp: , Rfl:  .  cyclobenzaprine (FLEXERIL) 10 MG tablet, Take 10 mg by mouth 2 (two) times daily., Disp: , Rfl:  .  FLUoxetine (PROZAC) 20 MG capsule, Take 40 mg by mouth daily., Disp: , Rfl:  .  hydrocortisone (ANUSOL-HC) 2.5 % rectal cream, Place 1 application rectally 3 (three) times daily as needed for hemorrhoids or itching. Reported on 12/02/2015, Disp: , Rfl:  .  levothyroxine (SYNTHROID, LEVOTHROID) 88 MCG tablet, Take 88 mcg by mouth daily before breakfast. , Disp: , Rfl:  .  loperamide (IMODIUM) 2 MG capsule, Take by mouth as needed for diarrhea or loose stools. prn, Disp: , Rfl:  .  LORazepam (ATIVAN) 0.5 MG tablet, Take 0.5 mg by mouth at bedtime., Disp: , Rfl:  .  lubiprostone (AMITIZA) 8 MCG capsule, Take 8 mcg by mouth 2 (two) times daily with a meal., Disp: , Rfl:  .  Melatonin 3 MG TABS, Take 3 mg by mouth at bedtime., Disp: , Rfl:  .  metFORMIN (GLUCOPHAGE) 500 MG tablet, Take 500 mg by mouth daily with breakfast. , Disp: , Rfl:  .  metoprolol tartrate (LOPRESSOR) 25 MG tablet, Take 0.5 tablets (12.5 mg total) by  mouth 2 (two) times daily., Disp: 60 tablet, Rfl: 0 .  Potassium 99 MG TABS, Take 1 tablet by mouth daily., Disp: , Rfl:  .  simvastatin (ZOCOR) 20 MG tablet, Take 20 mg by mouth daily at 6 PM., Disp: , Rfl:  .  tiotropium (SPIRIVA) 18 MCG inhalation capsule, Place 18 mcg into inhaler and inhale daily. Reported on 08/07/2015, Disp: , Rfl:   Past Medical History: Past Medical History:  Diagnosis Date  . Anxiety   . Chest pain   . CHF (congestive heart failure) ([place])   . Depression   . Diabetes mellitus without complication ([place])   . Fibromyalgia   . Graves disease   . Hypertension   . IBS (irritable bowel syndrome)   . PTSD (post-traumatic stress disorder)   . Sinus problem   . Spinal stenosis     Tobacco Use: Social History   Tobacco Use  Smoking Status Former Smoker  . Packs/day: 0.50  . Years: 25.00  . Pack years: 12.50  . Types: Cigarettes  . Last attempt to quit: 06/03/2016  . Years since quitting: 1.7  Smokeless Tobacco Never Used    Labs: Recent Review Scientist, physiological    Labs for ITP Cardiac and Pulmonary Rehab Latest Ref Rng & Units 01/04/2017   Hemoglobin A1c 4.8 - 5.6 % 6.1(H)  Pulmonary Assessment Scores: Pulmonary Assessment Scores    Row Name 02/01/18 1440         ADL UCSD   ADL Phase  Entry     SOB Score total  82     Rest  1     Walk  3     Stairs  4     Bath  4     Dress  4     Shop  5       CAT Score   CAT Score  28       mMRC Score   mMRC Score  3        Pulmonary Function Assessment: Pulmonary Function Assessment - 02/01/18 1502      Breath   Bilateral Breath Sounds  Clear    Shortness of Breath  Yes;Limiting activity       Exercise Target Goals: Exercise Program Goal: Individual exercise prescription set using results from initial 6 min walk test and THRR while considering  patient's activity barriers and safety.   Exercise Prescription Goal: Initial exercise prescription builds to 30-45 minutes a day of aerobic  activity, 2-3 days per week.  Home exercise guidelines will be given to patient during program as part of exercise prescription that the participant will acknowledge.  Activity Barriers & Risk Stratification: Activity Barriers & Cardiac Risk Stratification - 02/01/18 1511      Activity Barriers & Cardiac Risk Stratification   Activity Barriers  Shortness of Breath;Balance Concerns;Deconditioning;Muscular Weakness;Neck/Spine Problems;Back Problems;Other (comment)    Comments  Low back spinal stenosis, Frequently dizzyheaded       6 Minute Walk: 6 Minute Walk    Row Name 02/01/18 1509         6 Minute Walk   Phase  Initial     Distance  1006 feet     Walk Time  6 minutes     # of Rest Breaks  0     MPH  1.91     METS  3.11     RPE  13     Perceived Dyspnea   3     VO2 Peak  10.88     Symptoms  Yes (comment)     Comments  SOB, dizziness (all day)     Resting HR  108 bpm     Resting BP  134/80     Resting Oxygen Saturation   92 %     Exercise Oxygen Saturation  during 6 min walk  86 %     Max Ex. HR  121 bpm     Max Ex. BP  186/102     2 Minute Post BP  184/96 rck 134/72       Interval HR   1 Minute HR  113     2 Minute HR  119     3 Minute HR  118     4 Minute HR  120     5 Minute HR  120     6 Minute HR  121     2 Minute Post HR  112     Interval Heart Rate?  Yes       Interval Oxygen   Interval Oxygen?  Yes     Baseline Oxygen Saturation %  92 %     1 Minute Oxygen Saturation %  91 %     1 Minute Liters of Oxygen  0 L Room Air     2  Minute Oxygen Saturation %  88 %     2 Minute Liters of Oxygen  0 L     3 Minute Oxygen Saturation %  86 %     3 Minute Liters of Oxygen  0 L     4 Minute Oxygen Saturation %  86 %     4 Minute Liters of Oxygen  0 L     5 Minute Oxygen Saturation %  86 %     5 Minute Liters of Oxygen  0 L     6 Minute Oxygen Saturation %  86 %     6 Minute Liters of Oxygen  0 L     2 Minute Post Oxygen Saturation %  94 %     2 Minute Post  Liters of Oxygen  0 L       Oxygen Initial Assessment: Oxygen Initial Assessment - 02/01/18 1502      Home Oxygen   Home Oxygen Device  None    Sleep Oxygen Prescription  None    Home Exercise Oxygen Prescription  None    Home at Rest Exercise Oxygen Prescription  None      Initial 6 min Walk   Oxygen Used  None      Program Oxygen Prescription   Program Oxygen Prescription  None      Intervention   Short Term Goals  To learn and demonstrate proper use of respiratory medications;To learn and demonstrate proper pursed lip breathing techniques or other breathing techniques.;To learn and understand importance of maintaining oxygen saturations>88%;To learn and understand importance of monitoring SPO2 with pulse oximeter and demonstrate accurate use of the pulse oximeter.    Long  Term Goals  Verbalizes importance of monitoring SPO2 with pulse oximeter and return demonstration;Maintenance of O2 saturations>88%;Exhibits proper breathing techniques, such as pursed lip breathing or other method taught during program session;Compliance with respiratory medication;Demonstrates proper use of MDI's       Oxygen Re-Evaluation:   Oxygen Discharge (Final Oxygen Re-Evaluation):   Initial Exercise Prescription: Initial Exercise Prescription - 02/01/18 1500      Date of Initial Exercise RX and Referring Provider   Date  02/01/18    Referring Provider  Ancil Linsey MD      Treadmill   MPH  1.5    Grade  0.5    Minutes  15   may need rest breaks depending on oxygen saturations   METs  2.25      NuStep   Level  2    SPM  80    Minutes  15    METs  2      Arm Ergometer   Level  2    Watts  34    RPM  25    Minutes  15    METs  2      Prescription Details   Frequency (times per week)  3    Duration  Progress to 45 minutes of aerobic exercise without signs/symptoms of physical distress      Intensity   THRR 40-80% of Max Heartrate  124-139    Ratings of Perceived Exertion   11-13    Perceived Dyspnea  0-4      Progression   Progression  Continue to progress workloads to maintain intensity without signs/symptoms of physical distress.      Resistance Training   Training Prescription  Yes    Weight  3 lbs    Reps  10-15  Perform Capillary Blood Glucose checks as needed.  Exercise Prescription Changes: Exercise Prescription Changes    Row Name 02/01/18 1500 02/21/18 1300           Response to Exercise   Blood Pressure (Admit)  134/80  132/72      Blood Pressure (Exercise)  186/102  136/64      Blood Pressure (Exit)  184/96 rck 134/72  108/60      Heart Rate (Admit)  108 bpm  104 bpm      Heart Rate (Exercise)  121 bpm  105 bpm      Heart Rate (Exit)  112 bpm  89 bpm      Oxygen Saturation (Admit)  92 %  95 %      Oxygen Saturation (Exercise)  86 %  91 %      Oxygen Saturation (Exit)  94 %  97 %      Rating of Perceived Exertion (Exercise)  13  13      Perceived Dyspnea (Exercise)  3  3      Symptoms  SOB, dizziheaded  SOB      Comments  walk test results  second full day of exercise      Duration  -  Progress to 45 minutes of aerobic exercise without signs/symptoms of physical distress      Intensity  -  THRR unchanged        Progression   Progression  -  Continue to progress workloads to maintain intensity without signs/symptoms of physical distress.      Average METs  -  2.08        Resistance Training   Training Prescription  -  Yes      Weight  -  3 lbs      Reps  -  10-15        Interval Training   Interval Training  -  No        Treadmill   MPH  -  1.5      Grade  -  0.5      Minutes  -  15      METs  -  2.25        NuStep   Level  -  2      Minutes  -  15      METs  -  2.2        Arm Ergometer   Level  -  2      Minutes  -  15      METs  -  1.8         Exercise Comments: Exercise Comments    Row Name 02/14/18 1212           Exercise Comments   First full day of exercise!  Patient was oriented to gym and  equipment including functions, settings, policies, and procedures.  Patient's individual exercise prescription and treatment plan were reviewed.  All starting workloads were established based on the results of the 6 minute walk test done at initial orientation visit.  The plan for exercise progression was also introduced and progression will be customized based on patient's performance and goals.          Exercise Goals and Review:   Exercise Goals Re-Evaluation : Exercise Goals Re-Evaluation    Row Name 02/14/18 1212 02/21/18 1356           Exercise Goal Re-Evaluation   Exercise Goals  Review  Increase Physical Activity;Able to understand and use rate of perceived exertion (RPE) scale;Knowledge and understanding of Target Heart Rate Range (THRR);Increase Strength and Stamina;Able to understand and use Dyspnea scale  Increase Physical Activity;Increase Strength and Stamina;Understanding of Exercise Prescription      Comments  Reviewed RPE scale, THR and program prescription with pt today.  Pt voiced understanding and was given a copy of goals to take home.   [name] is off to a good start in rehab.  She has completed two full days of exercise.  We will continue to encourage her to have good attendance.  We continue to monitor her progress.       Expected Outcomes  Short: Use RPE daily to regulate intensity. Long: Follow program prescription in THR.  Short: Attend classes regularly.  Long: Continue to follow program prescription.          Discharge Exercise Prescription (Final Exercise Prescription Changes): Exercise Prescription Changes - 02/21/18 1300      Response to Exercise   Blood Pressure (Admit)  132/72    Blood Pressure (Exercise)  136/64    Blood Pressure (Exit)  108/60    Heart Rate (Admit)  104 bpm    Heart Rate (Exercise)  105 bpm    Heart Rate (Exit)  89 bpm    Oxygen Saturation (Admit)  95 %    Oxygen Saturation (Exercise)  91 %    Oxygen Saturation (Exit)  97 %    Rating  of Perceived Exertion (Exercise)  13    Perceived Dyspnea (Exercise)  3    Symptoms  SOB    Comments  second full day of exercise    Duration  Progress to 45 minutes of aerobic exercise without signs/symptoms of physical distress    Intensity  THRR unchanged      Progression   Progression  Continue to progress workloads to maintain intensity without signs/symptoms of physical distress.    Average METs  2.08      Resistance Training   Training Prescription  Yes    Weight  3 lbs    Reps  10-15      Interval Training   Interval Training  No      Treadmill   MPH  1.5    Grade  0.5    Minutes  15    METs  2.25      NuStep   Level  2    Minutes  15    METs  2.2      Arm Ergometer   Level  2    Minutes  15    METs  1.8       Nutrition:  Target Goals: Understanding of nutrition guidelines, daily intake of sodium <1560m, cholesterol <2050m calories 30% from fat and 7% or less from saturated fats, daily to have 5 or more servings of fruits and vegetables.  Biometrics: Pre Biometrics - 02/01/18 1515      Pre Biometrics   Height  '5\' 5"'  (1.651 m)    Weight  140 lb 9.6 oz (63.8 kg)    Waist Circumference  32 inches    Hip Circumference  36 inches    Waist to Hip Ratio  0.89 %    BMI (Calculated)  23.4    Single Leg Stand  9.4 seconds        Nutrition Therapy Plan and Nutrition Goals: Nutrition Therapy & Goals - 02/16/18 1348      Nutrition Therapy  Diet  TLC     Protein (specify units)  9oz    Fiber  20 grams    Whole Grain Foods  3 servings    Saturated Fats  12 max. grams    Fruits and Vegetables  6 servings/day   8 ideal; eats small portions but eats fruits and vegetables daily   Sodium  1500 grams      Personal Nutrition Goals   Nutrition Goal  Eat 1-2 additional servings of fruit per week    Personal Goal #2  Continue to work on reducing the amount of red meat you eat, great job for starting this goal!    Comments  She eats smaller, more frequent meals  that are non-starchy vegetable and protein-focused. She is working on eating less red meat and pork. Eats chicken, fish, beans, dairy, legumes, peanut butter as her main protien sources. Her BG typically ranges between 97-100. She uses Splenda to sweeten items. Prefers dried fruits. Drinks mostly water, Dr. Malachi Bonds rarely      Intervention Plan   Intervention  Prescribe, educate and counsel regarding individualized specific dietary modifications aiming towards targeted core components such as weight, hypertension, lipid management, diabetes, heart failure and other comorbidities.    Expected Outcomes  Short Term Goal: Understand basic principles of dietary content, such as calories, fat, sodium, cholesterol and nutrients.;Long Term Goal: Adherence to prescribed nutrition plan.;Short Term Goal: A plan has been developed with personal nutrition goals set during dietitian appointment.       Nutrition Assessments: Nutrition Assessments - 02/01/18 1441      MEDFICTS Scores   Pre Score  20       Nutrition Goals Re-Evaluation: Nutrition Goals Re-Evaluation    Row Name 02/16/18 1404             Goals   Nutrition Goal  Eat 1-2 additional servings of fruit per week       Comment  She eats as banana every other day and may have dried fruit, apples or peaches as well, but not consistently       Expected Outcome  She will eat at least 1 additional serving of fruit each week, working up to eating 2-4 servings of fruit per day         Personal Goal #2 Re-Evaluation   Personal Goal #2  Continue to work on reducing the amount of red meat you eat, great job for starting this goal!          Nutrition Goals Discharge (Final Nutrition Goals Re-Evaluation): Nutrition Goals Re-Evaluation - 02/16/18 1404      Goals   Nutrition Goal  Eat 1-2 additional servings of fruit per week    Comment  She eats as banana every other day and may have dried fruit, apples or peaches as well, but not consistently     Expected Outcome  She will eat at least 1 additional serving of fruit each week, working up to eating 2-4 servings of fruit per day      Personal Goal #2 Re-Evaluation   Personal Goal #2  Continue to work on reducing the amount of red meat you eat, great job for starting this goal!       Psychosocial: Target Goals: Acknowledge presence or absence of significant depression and/or stress, maximize coping skills, provide positive support system. Participant is able to verbalize types and ability to use techniques and skills needed for reducing stress and depression.   Initial Review & Psychosocial Screening:  Initial Psych Review & Screening - 02/01/18 1503      Initial Review   Current issues with  Current Psychotropic Meds;Current Depression;Current Sleep Concerns;Current Stress Concerns   on prozac   Source of Stress Concerns  Chronic Illness    Comments  Her health keeps going down steadily. She wants to halt her disease.      Family Dynamics   Good Support System?  Yes    Comments  She can look to her daugter [name], minister and her grandson.      Barriers   Psychosocial barriers to participate in program  The patient should benefit from training in stress management and relaxation.      Screening Interventions   Interventions  Encouraged to exercise;Program counselor consult;Provide feedback about the scores to participant;To provide support and resources with identified psychosocial needs    Expected Outcomes  Short Term goal: Utilizing psychosocial counselor, staff and physician to assist with identification of specific Stressors or current issues interfering with healing process. Setting desired goal for each stressor or current issue identified.;Long Term Goal: Stressors or current issues are controlled or eliminated.;Short Term goal: Identification and review with participant of any Quality of Life or Depression concerns found by scoring the questionnaire.;Long Term goal: The  participant improves quality of Life and PHQ9 Scores as seen by post scores and/or verbalization of changes       Quality of Life Scores:  Scores of 19 and below usually indicate a poorer quality of life in these areas.  A difference of  2-3 points is a clinically meaningful difference.  A difference of 2-3 points in the total score of the Quality of Life Index has been associated with significant improvement in overall quality of life, self-image, physical symptoms, and general health in studies assessing change in quality of life.  PHQ-9: Recent Review Flowsheet Data    Depression screen [facility] 2/9 02/01/2018 01/07/2016 12/02/2015 11/06/2015 10/01/2015   Decreased Interest 0 0 0 0 0   Down, Depressed, Hopeless 1 0 0 0 1   PHQ - 2 Score 1 0 0 0 1   Altered sleeping 3 - - - -   Change in appetite 1 - - - -   Feeling bad or failure about yourself  1 - - - -   Trouble concentrating 0 - - - -   Moving slowly or fidgety/restless 0 - - - -   Suicidal thoughts 0 - - - -   PHQ-9 Score 6 - - - -   Difficult doing work/chores Somewhat difficult - - - -     Interpretation of Total Score  Total Score Depression Severity:  1-4 = Minimal depression, 5-9 = Mild depression, 10-14 = Moderate depression, 15-19 = Moderately severe depression, 20-27 = Severe depression   Psychosocial Evaluation and Intervention:   Psychosocial Re-Evaluation:   Psychosocial Discharge (Final Psychosocial Re-Evaluation):   Education: Education Goals: Education classes will be provided on a weekly basis, covering required topics. Participant will state understanding/return demonstration of topics presented.  Learning Barriers/Preferences: Learning Barriers/Preferences - 02/01/18 1506      Learning Barriers/Preferences   Learning Barriers  Sight   wears glasses   Learning Preferences  Written Material       Education Topics:  Initial Evaluation Education: - Verbal, written and demonstration of respiratory meds,  oximetry and breathing techniques. Instruction on use of nebulizers and MDIs and importance of monitoring MDI activations.   Pulmonary Rehab from 02/16/2018 in [facility] Cardiac  and Pulmonary Rehab  Date  02/01/18  Educator  [facility]  Instruction Review Code  1- Verbalizes Understanding      General Nutrition Guidelines/Fats and Fiber: -Group instruction provided by verbal, written material, models and posters to present the general guidelines for heart healthy nutrition. Gives an explanation and review of dietary fats and fiber.   Controlling Sodium/Reading Food Labels: -Group verbal and written material supporting the discussion of sodium use in heart healthy nutrition. Review and explanation with models, verbal and written materials for utilization of the food label.   Exercise Physiology & General Exercise Guidelines: - Group verbal and written instruction with models to review the exercise physiology of the cardiovascular system and associated critical values. Provides general exercise guidelines with specific guidelines to those with heart or lung disease.    Aerobic Exercise & Resistance Training: - Gives group verbal and written instruction on the various components of exercise. Focuses on aerobic and resistive training programs and the benefits of this training and how to safely progress through these programs.   Flexibility, Balance, Mind/Body Relaxation: Provides group verbal/written instruction on the benefits of flexibility and balance training, including mind/body exercise modes such as yoga, pilates and tai chi.  Demonstration and skill practice provided.   Stress and Anxiety: - Provides group verbal and written instruction about the health risks of elevated stress and causes of high stress.  Discuss the correlation between heart/lung disease and anxiety and treatment options. Review healthy ways to manage with stress and anxiety.   Depression: - Provides group verbal and written  instruction on the correlation between heart/lung disease and depressed mood, treatment options, and the stigmas associated with seeking treatment.   Exercise & Equipment Safety: - Individual verbal instruction and demonstration of equipment use and safety with use of the equipment.   Pulmonary Rehab from 02/16/2018 in [facility] Cardiac and Pulmonary Rehab  Date  02/01/18  Educator  [facility]  Instruction Review Code  1- Verbalizes Understanding      Infection Prevention: - Provides verbal and written material to individual with discussion of infection control including proper hand washing and proper equipment cleaning during exercise session.   Pulmonary Rehab from 02/16/2018 in [facility] Cardiac and Pulmonary Rehab  Date  02/01/18  Educator  [facility]  Instruction Review Code  1- Verbalizes Understanding      Falls Prevention: - Provides verbal and written material to individual with discussion of falls prevention and safety.   Pulmonary Rehab from 02/16/2018 in [facility] Cardiac and Pulmonary Rehab  Date  02/01/18  Educator  [facility]  Instruction Review Code  1- Verbalizes Understanding      Diabetes: - Individual verbal and written instruction to review signs/symptoms of diabetes, desired ranges of glucose level fasting, after meals and with exercise. Advice that pre and post exercise glucose checks will be done for 3 sessions at entry of program.   Chronic Lung Diseases: - Group verbal and written instruction to review updates, respiratory medications, advancements in procedures and treatments. Discuss use of supplemental oxygen including available portable oxygen systems, continuous and intermittent flow rates, concentrators, personal use and safety guidelines. Review proper use of inhaler and spacers. Provide informative websites for self-education.    Energy Conservation: - Provide group verbal and written instruction for methods to conserve energy, plan and organize activities. Instruct on pacing  techniques, use of adaptive equipment and posture/positioning to relieve shortness of breath.   Triggers and Exacerbations: - Group verbal and written instruction to review types of environmental  triggers and ways to prevent exacerbations. Discuss weather changes, air quality and the benefits of nasal washing. Review warning signs and symptoms to help prevent infections. Discuss techniques for effective airway clearance, coughing, and vibrations.   Pulmonary Rehab from 02/16/2018 in [facility] Cardiac and Pulmonary Rehab  Date  02/16/18  Educator  [facility]  Instruction Review Code  1- Verbalizes Understanding      AED/CPR: - Group verbal and written instruction with the use of models to demonstrate the basic use of the AED with the basic ABC's of resuscitation.   Anatomy and Physiology of the Lungs: - Group verbal and written instruction with the use of models to provide basic lung anatomy and physiology related to function, structure and complications of lung disease.   Anatomy & Physiology of the Heart: - Group verbal and written instruction and models provide basic cardiac anatomy and physiology, with the coronary electrical and arterial systems. Review of Valvular disease and Heart Failure   Cardiac Medications: - Group verbal and written instruction to review commonly prescribed medications for heart disease. Reviews the medication, class of the drug, and side effects.   Know Your Numbers and Risk Factors: -Group verbal and written instruction about important numbers in your health.  Discussion of what are risk factors and how they play a role in the disease process.  Review of Cholesterol, Blood Pressure, Diabetes, and BMI and the role they play in your overall health.   Sleep Hygiene: -Provides group verbal and written instruction about how sleep can affect your health.  Define sleep hygiene, discuss sleep cycles and impact of sleep habits. Review good sleep hygiene tips.     Other: -Provides group and verbal instruction on various topics (see comments)    Knowledge Questionnaire Score: Knowledge Questionnaire Score - 02/01/18 1441      Knowledge Questionnaire Score   Pre Score  16/18   reviewed with patient       Core Components/Risk Factors/Patient Goals at Admission: Personal Goals and Risk Factors at Admission - 02/01/18 1509      Core Components/Risk Factors/Patient Goals on Admission    Weight Management  Yes    Intervention  Weight Management: Develop a combined nutrition and exercise program designed to reach desired caloric intake, while maintaining appropriate intake of nutrient and fiber, sodium and fats, and appropriate energy expenditure required for the weight goal.;Weight Management: Provide education and appropriate resources to help participant work on and attain dietary goals.;Weight Management/Obesity: Establish reasonable short term and long term weight goals.    Admit Weight  140 lb 9.6 oz (63.8 kg)    Goal Weight: Short Term  135 lb (61.2 kg)    Goal Weight: Long Term  135 lb (61.2 kg)    Expected Outcomes  Short Term: Continue to assess and modify interventions until short term weight is achieved;Long Term: Adherence to nutrition and physical activity/exercise program aimed toward attainment of established weight goal;Weight Maintenance: Understanding of the daily nutrition guidelines, which includes 25-35% calories from fat, 7% or less cal from saturated fats, less than 249m cholesterol, less than 1.5gm of sodium, & 5 or more servings of fruits and vegetables daily;Understanding recommendations for meals to include 15-35% energy as protein, 25-35% energy from fat, 35-60% energy from carbohydrates, less than 2052mof dietary cholesterol, 20-35 gm of total fiber daily;Understanding of distribution of calorie intake throughout the day with the consumption of 4-5 meals/snacks;Weight Loss: Understanding of general recommendations for a  balanced deficit meal plan, which promotes 1-2  lb weight loss per week and includes a negative energy balance of [phone] kcal/d    Improve shortness of breath with ADL's  Yes    Intervention  Provide education, individualized exercise plan and daily activity instruction to help decrease symptoms of SOB with activities of daily living.    Expected Outcomes  Long Term: Be able to perform more ADLs without symptoms or delay the onset of symptoms;Short Term: Improve cardiorespiratory fitness to achieve a reduction of symptoms when performing ADLs    Diabetes  Yes    Intervention  Provide education about signs/symptoms and action to take for hypo/hyperglycemia.;Provide education about proper nutrition, including hydration, and aerobic/resistive exercise prescription along with prescribed medications to achieve blood glucose in normal ranges: Fasting glucose 65-99 mg/dL    Expected Outcomes  Short Term: Participant verbalizes understanding of the signs/symptoms and immediate care of hyper/hypoglycemia, proper foot care and importance of medication, aerobic/resistive exercise and nutrition plan for blood glucose control.;Long Term: Attainment of HbA1C < 7%.    Lipids  Yes    Intervention  Provide education and support for participant on nutrition & aerobic/resistive exercise along with prescribed medications to achieve LDL <54m, HDL >481m    Expected Outcomes  Long Term: Cholesterol controlled with medications as prescribed, with individualized exercise RX and with personalized nutrition plan. Value goals: LDL < 7035mHDL > 40 mg.;Short Term: Participant states understanding of desired cholesterol values and is compliant with medications prescribed. Participant is following exercise prescription and nutrition guidelines.       Core Components/Risk Factors/Patient Goals Review:    Core Components/Risk Factors/Patient Goals at Discharge (Final Review):    ITP Comments: ITP Comments    Row Name  02/01/18 1429 02/01/18 1435 03/01/18 1550       ITP Comments  Medical Evaluation completed. Chart sent for review and changes to Dr. MarEmily [facility]  Met face to face with Dr. MarEmily Filbertrector of [facility] reviewed and Signed.  Patient states that she has been sick and has a doctors appointment with her Pulmonologist at the end of the month. Informed patient if she wants to return she would need a new referral. Patient verbalizes understanding. Patient discharged.        Comments: Discharge ITP

## 2018-03-01 NOTE — Progress Notes (Signed)
Discharge Progress Report  Patient Details  Name: [name] MRN: [id number] Date of Birth: 1946/04/22 Referring Provider:     Pulmonary Rehab from 02/01/2018 in [facility]  Referring Provider  Brett Canales MD       Number of Visits: 3/36  Reason for Discharge:  Early Exit:  Personal and health issues  Smoking History:  Social History   Tobacco Use  Smoking Status Former Smoker  . Packs/day: 0.50  . Years: 25.00  . Pack years: 12.50  . Types: Cigarettes  . Last attempt to quit: 06/03/2016  . Years since quitting: 1.7  Smokeless Tobacco Never Used    Diagnosis:  Chronic obstructive pulmonary disease, unspecified COPD type (HCC)  ADL UCSD: Pulmonary Assessment Scores    Row Name 02/01/18 1440         ADL UCSD   ADL Phase  Entry     SOB Score total  82     Rest  1     Walk  3     Stairs  4     Bath  4     Dress  4     Shop  5       CAT Score   CAT Score  28       mMRC Score   mMRC Score  3        Initial Exercise Prescription: Initial Exercise Prescription - 02/01/18 1500      Date of Initial Exercise RX and Referring Provider   Date  02/01/18    Referring Provider  Brett Canales MD      Treadmill   MPH  1.5    Grade  0.5    Minutes  15   may need rest breaks depending on oxygen saturations   METs  2.25      NuStep   Level  2    SPM  80    Minutes  15    METs  2      Arm Ergometer   Level  2    Watts  34    RPM  25    Minutes  15    METs  2      Prescription Details   Frequency (times per week)  3    Duration  Progress to 45 minutes of aerobic exercise without signs/symptoms of physical distress      Intensity   THRR 40-80% of Max Heartrate  124-139    Ratings of Perceived Exertion  11-13    Perceived Dyspnea  0-4      Progression   Progression  Continue to progress workloads to maintain intensity without signs/symptoms of physical distress.      Resistance Training   Training Prescription  Yes     Weight  3 lbs    Reps  10-15       Discharge Exercise Prescription (Final Exercise Prescription Changes): Exercise Prescription Changes - 02/21/18 1300      Response to Exercise   Blood Pressure (Admit)  132/72    Blood Pressure (Exercise)  136/64    Blood Pressure (Exit)  108/60    Heart Rate (Admit)  104 bpm    Heart Rate (Exercise)  105 bpm    Heart Rate (Exit)  89 bpm    Oxygen Saturation (Admit)  95 %    Oxygen Saturation (Exercise)  91 %    Oxygen Saturation (Exit)  97 %    Rating of  Perceived Exertion (Exercise)  13    Perceived Dyspnea (Exercise)  3    Symptoms  SOB    Comments  second full day of exercise    Duration  Progress to 45 minutes of aerobic exercise without signs/symptoms of physical distress    Intensity  THRR unchanged      Progression   Progression  Continue to progress workloads to maintain intensity without signs/symptoms of physical distress.    Average METs  2.08      Resistance Training   Training Prescription  Yes    Weight  3 lbs    Reps  10-15      Interval Training   Interval Training  No      Treadmill   MPH  1.5    Grade  0.5    Minutes  15    METs  2.25      NuStep   Level  2    Minutes  15    METs  2.2      Arm Ergometer   Level  2    Minutes  15    METs  1.8       Functional Capacity: 6 Minute Walk    Row Name 02/01/18 1509         6 Minute Walk   Phase  Initial     Distance  1006 feet     Walk Time  6 minutes     # of Rest Breaks  0     MPH  1.91     METS  3.11     RPE  13     Perceived Dyspnea   3     VO2 Peak  10.88     Symptoms  Yes (comment)     Comments  SOB, dizziness (all day)     Resting HR  108 bpm     Resting BP  134/80     Resting Oxygen Saturation   92 %     Exercise Oxygen Saturation  during 6 min walk  86 %     Max Ex. HR  121 bpm     Max Ex. BP  186/102     2 Minute Post BP  184/96 rck 134/72       Interval HR   1 Minute HR  113     2 Minute HR  119     3 Minute HR  118     4  Minute HR  120     5 Minute HR  120     6 Minute HR  121     2 Minute Post HR  112     Interval Heart Rate?  Yes       Interval Oxygen   Interval Oxygen?  Yes     Baseline Oxygen Saturation %  92 %     1 Minute Oxygen Saturation %  91 %     1 Minute Liters of Oxygen  0 L Room Air     2 Minute Oxygen Saturation %  88 %     2 Minute Liters of Oxygen  0 L     3 Minute Oxygen Saturation %  86 %     3 Minute Liters of Oxygen  0 L     4 Minute Oxygen Saturation %  86 %     4 Minute Liters of Oxygen  0 L     5 Minute Oxygen Saturation %  86 %     5 Minute Liters of Oxygen  0 L     6 Minute Oxygen Saturation %  86 %     6 Minute Liters of Oxygen  0 L     2 Minute Post Oxygen Saturation %  94 %     2 Minute Post Liters of Oxygen  0 L        Psychological, QOL, Others - Outcomes: PHQ 2/9: Depression screen [facility] 2/9 02/01/2018 01/07/2016 12/02/2015 11/06/2015 10/01/2015  Decreased Interest 0 0 0 0 0  Down, Depressed, Hopeless 1 0 0 0 1  PHQ - 2 Score 1 0 0 0 1  Altered sleeping 3 - - - -  Change in appetite 1 - - - -  Feeling bad or failure about yourself  1 - - - -  Trouble concentrating 0 - - - -  Moving slowly or fidgety/restless 0 - - - -  Suicidal thoughts 0 - - - -  PHQ-9 Score 6 - - - -  Difficult doing work/chores Somewhat difficult - - - -    Quality of Life:   Personal Goals: Goals established at orientation with interventions provided to work toward goal. Personal Goals and Risk Factors at Admission - 02/01/18 1509      Core Components/Risk Factors/Patient Goals on Admission    Weight Management  Yes    Intervention  Weight Management: Develop a combined nutrition and exercise program designed to reach desired caloric intake, while maintaining appropriate intake of nutrient and fiber, sodium and fats, and appropriate energy expenditure required for the weight goal.;Weight Management: Provide education and appropriate resources to help participant work on and attain  dietary goals.;Weight Management/Obesity: Establish reasonable short term and long term weight goals.    Admit Weight  140 lb 9.6 oz (63.8 kg)    Goal Weight: Short Term  135 lb (61.2 kg)    Goal Weight: Long Term  135 lb (61.2 kg)    Expected Outcomes  Short Term: Continue to assess and modify interventions until short term weight is achieved;Long Term: Adherence to nutrition and physical activity/exercise program aimed toward attainment of established weight goal;Weight Maintenance: Understanding of the daily nutrition guidelines, which includes 25-35% calories from fat, 7% or less cal from saturated fats, less than 200mg  cholesterol, less than 1.5gm of sodium, & 5 or more servings of fruits and vegetables daily;Understanding recommendations for meals to include 15-35% energy as protein, 25-35% energy from fat, 35-60% energy from carbohydrates, less than 200mg  of dietary cholesterol, 20-35 gm of total fiber daily;Understanding of distribution of calorie intake throughout the day with the consumption of 4-5 meals/snacks;Weight Loss: Understanding of general recommendations for a balanced deficit meal plan, which promotes 1-2 lb weight loss per week and includes a negative energy balance of [phone] kcal/d    Improve shortness of breath with ADL's  Yes    Intervention  Provide education, individualized exercise plan and daily activity instruction to help decrease symptoms of SOB with activities of daily living.    Expected Outcomes  Long Term: Be able to perform more ADLs without symptoms or delay the onset of symptoms;Short Term: Improve cardiorespiratory fitness to achieve a reduction of symptoms when performing ADLs    Diabetes  Yes    Intervention  Provide education about signs/symptoms and action to take for hypo/hyperglycemia.;Provide education about proper nutrition, including hydration, and aerobic/resistive exercise prescription along with prescribed medications to achieve blood glucose in normal  ranges: Fasting glucose  65-99 mg/dL    Expected Outcomes  Short Term: Participant verbalizes understanding of the signs/symptoms and immediate care of hyper/hypoglycemia, proper foot care and importance of medication, aerobic/resistive exercise and nutrition plan for blood glucose control.;Long Term: Attainment of HbA1C < 7%.    Lipids  Yes    Intervention  Provide education and support for participant on nutrition & aerobic/resistive exercise along with prescribed medications to achieve LDL 70mg , HDL >40mg .    Expected Outcomes  Long Term: Cholesterol controlled with medications as prescribed, with individualized exercise RX and with personalized nutrition plan. Value goals: LDL < 70mg , HDL > 40 mg.;Short Term: Participant states understanding of desired cholesterol values and is compliant with medications prescribed. Participant is following exercise prescription and nutrition guidelines.        Personal Goals Discharge:   Exercise Goals and Review:   Nutrition & Weight - Outcomes: Pre Biometrics - 02/01/18 1515      Pre Biometrics   Height  5\' 5"  (1.651 m)    Weight  140 lb 9.6 oz (63.8 kg)    Waist Circumference  32 inches    Hip Circumference  36 inches    Waist to Hip Ratio  0.89 %    BMI (Calculated)  23.4    Single Leg Stand  9.4 seconds        Nutrition: Nutrition Therapy & Goals - 02/16/18 1348      Nutrition Therapy   Diet  TLC     Protein (specify units)  9oz    Fiber  20 grams    Whole Grain Foods  3 servings    Saturated Fats  12 max. grams    Fruits and Vegetables  6 servings/day   8 ideal; eats small portions but eats fruits and vegetables daily   Sodium  1500 grams      Personal Nutrition Goals   Nutrition Goal  Eat 1-2 additional servings of fruit per week    Personal Goal #2  Continue to work on reducing the amount of red meat you eat, great job for starting this goal!    Comments  She eats smaller, more frequent meals that are non-starchy vegetable  and protein-focused. She is working on eating less red meat and pork. Eats chicken, fish, beans, dairy, legumes, peanut butter as her main protien sources. Her BG typically ranges between 97-100. She uses Splenda to sweeten items. Prefers dried fruits. Drinks mostly water, Dr. Reino Kent rarely      Intervention Plan   Intervention  Prescribe, educate and counsel regarding individualized specific dietary modifications aiming towards targeted core components such as weight, hypertension, lipid management, diabetes, heart failure and other comorbidities.    Expected Outcomes  Short Term Goal: Understand basic principles of dietary content, such as calories, fat, sodium, cholesterol and nutrients.;Long Term Goal: Adherence to prescribed nutrition plan.;Short Term Goal: A plan has been developed with personal nutrition goals set during dietitian appointment.       Nutrition Discharge: Nutrition Assessments - 02/01/18 1441      MEDFICTS Scores   Pre Score  20       Education Questionnaire Score: Knowledge Questionnaire Score - 02/01/18 1441      Knowledge Questionnaire Score   Pre Score  16/18   reviewed with patient      Goals reviewed with patient; copy given to patient.

## 2018-03-01 NOTE — Telephone Encounter (Signed)
Patient states that she has been sick and has a doctors appointment with her Pulmonologist at the end of the month. Informed patient if she wants to return she would need a new referral. Patient verbalizes understanding.

## 2018-05-18 DIAGNOSIS — M4800 Spinal stenosis, site unspecified: Secondary | ICD-10-CM | POA: Diagnosis not present

## 2018-05-19 DIAGNOSIS — M4800 Spinal stenosis, site unspecified: Secondary | ICD-10-CM | POA: Diagnosis not present

## 2018-05-20 DIAGNOSIS — M4800 Spinal stenosis, site unspecified: Secondary | ICD-10-CM | POA: Diagnosis not present

## 2018-05-21 DIAGNOSIS — M4800 Spinal stenosis, site unspecified: Secondary | ICD-10-CM | POA: Diagnosis not present

## 2018-05-22 DIAGNOSIS — M4800 Spinal stenosis, site unspecified: Secondary | ICD-10-CM | POA: Diagnosis not present

## 2018-05-23 DIAGNOSIS — M4800 Spinal stenosis, site unspecified: Secondary | ICD-10-CM | POA: Diagnosis not present

## 2018-05-24 DIAGNOSIS — M4800 Spinal stenosis, site unspecified: Secondary | ICD-10-CM | POA: Diagnosis not present

## 2018-05-25 DIAGNOSIS — M4800 Spinal stenosis, site unspecified: Secondary | ICD-10-CM | POA: Diagnosis not present

## 2018-05-26 DIAGNOSIS — M4800 Spinal stenosis, site unspecified: Secondary | ICD-10-CM | POA: Diagnosis not present

## 2018-05-27 DIAGNOSIS — M4800 Spinal stenosis, site unspecified: Secondary | ICD-10-CM | POA: Diagnosis not present

## 2018-05-28 DIAGNOSIS — M4800 Spinal stenosis, site unspecified: Secondary | ICD-10-CM | POA: Diagnosis not present

## 2018-05-29 DIAGNOSIS — M4800 Spinal stenosis, site unspecified: Secondary | ICD-10-CM | POA: Diagnosis not present

## 2018-05-30 DIAGNOSIS — M4800 Spinal stenosis, site unspecified: Secondary | ICD-10-CM | POA: Diagnosis not present

## 2018-05-31 DIAGNOSIS — M4800 Spinal stenosis, site unspecified: Secondary | ICD-10-CM | POA: Diagnosis not present

## 2018-06-01 DIAGNOSIS — M4800 Spinal stenosis, site unspecified: Secondary | ICD-10-CM | POA: Diagnosis not present

## 2018-06-02 DIAGNOSIS — M4800 Spinal stenosis, site unspecified: Secondary | ICD-10-CM | POA: Diagnosis not present

## 2018-06-03 DIAGNOSIS — M4800 Spinal stenosis, site unspecified: Secondary | ICD-10-CM | POA: Diagnosis not present

## 2018-06-04 DIAGNOSIS — M4800 Spinal stenosis, site unspecified: Secondary | ICD-10-CM | POA: Diagnosis not present

## 2018-06-05 DIAGNOSIS — M4800 Spinal stenosis, site unspecified: Secondary | ICD-10-CM | POA: Diagnosis not present

## 2018-06-06 DIAGNOSIS — M4800 Spinal stenosis, site unspecified: Secondary | ICD-10-CM | POA: Diagnosis not present

## 2018-06-07 DIAGNOSIS — M4800 Spinal stenosis, site unspecified: Secondary | ICD-10-CM | POA: Diagnosis not present

## 2018-06-08 DIAGNOSIS — M4800 Spinal stenosis, site unspecified: Secondary | ICD-10-CM | POA: Diagnosis not present

## 2018-06-09 DIAGNOSIS — M4800 Spinal stenosis, site unspecified: Secondary | ICD-10-CM | POA: Diagnosis not present

## 2018-06-10 DIAGNOSIS — M4800 Spinal stenosis, site unspecified: Secondary | ICD-10-CM | POA: Diagnosis not present

## 2018-06-11 DIAGNOSIS — M4800 Spinal stenosis, site unspecified: Secondary | ICD-10-CM | POA: Diagnosis not present

## 2018-06-12 DIAGNOSIS — M4800 Spinal stenosis, site unspecified: Secondary | ICD-10-CM | POA: Diagnosis not present

## 2018-06-13 DIAGNOSIS — Z0000 Encounter for general adult medical examination without abnormal findings: Secondary | ICD-10-CM | POA: Diagnosis not present

## 2018-06-13 DIAGNOSIS — J449 Chronic obstructive pulmonary disease, unspecified: Secondary | ICD-10-CM | POA: Diagnosis not present

## 2018-06-13 DIAGNOSIS — I6523 Occlusion and stenosis of bilateral carotid arteries: Secondary | ICD-10-CM | POA: Diagnosis not present

## 2018-06-13 DIAGNOSIS — I471 Supraventricular tachycardia: Secondary | ICD-10-CM | POA: Diagnosis not present

## 2018-06-20 DIAGNOSIS — M4800 Spinal stenosis, site unspecified: Secondary | ICD-10-CM | POA: Diagnosis not present

## 2018-06-21 DIAGNOSIS — M4800 Spinal stenosis, site unspecified: Secondary | ICD-10-CM | POA: Diagnosis not present

## 2018-06-22 DIAGNOSIS — M4800 Spinal stenosis, site unspecified: Secondary | ICD-10-CM | POA: Diagnosis not present

## 2018-06-23 DIAGNOSIS — M4800 Spinal stenosis, site unspecified: Secondary | ICD-10-CM | POA: Diagnosis not present

## 2018-06-24 DIAGNOSIS — M4800 Spinal stenosis, site unspecified: Secondary | ICD-10-CM | POA: Diagnosis not present

## 2018-06-27 DIAGNOSIS — M4800 Spinal stenosis, site unspecified: Secondary | ICD-10-CM | POA: Diagnosis not present

## 2018-06-28 DIAGNOSIS — M4800 Spinal stenosis, site unspecified: Secondary | ICD-10-CM | POA: Diagnosis not present

## 2018-06-29 DIAGNOSIS — H2513 Age-related nuclear cataract, bilateral: Secondary | ICD-10-CM | POA: Diagnosis not present

## 2018-06-29 DIAGNOSIS — Z0100 Encounter for examination of eyes and vision without abnormal findings: Secondary | ICD-10-CM | POA: Diagnosis not present

## 2018-06-29 DIAGNOSIS — M4800 Spinal stenosis, site unspecified: Secondary | ICD-10-CM | POA: Diagnosis not present

## 2018-06-29 DIAGNOSIS — E119 Type 2 diabetes mellitus without complications: Secondary | ICD-10-CM | POA: Diagnosis not present

## 2018-06-30 DIAGNOSIS — M4800 Spinal stenosis, site unspecified: Secondary | ICD-10-CM | POA: Diagnosis not present

## 2018-07-01 DIAGNOSIS — M4800 Spinal stenosis, site unspecified: Secondary | ICD-10-CM | POA: Diagnosis not present

## 2018-07-04 DIAGNOSIS — M4800 Spinal stenosis, site unspecified: Secondary | ICD-10-CM | POA: Diagnosis not present

## 2018-07-05 DIAGNOSIS — M4800 Spinal stenosis, site unspecified: Secondary | ICD-10-CM | POA: Diagnosis not present

## 2018-07-06 DIAGNOSIS — M4800 Spinal stenosis, site unspecified: Secondary | ICD-10-CM | POA: Diagnosis not present

## 2018-07-07 DIAGNOSIS — M4800 Spinal stenosis, site unspecified: Secondary | ICD-10-CM | POA: Diagnosis not present

## 2018-07-08 DIAGNOSIS — M4800 Spinal stenosis, site unspecified: Secondary | ICD-10-CM | POA: Diagnosis not present

## 2018-07-11 DIAGNOSIS — M4800 Spinal stenosis, site unspecified: Secondary | ICD-10-CM | POA: Diagnosis not present

## 2018-07-12 DIAGNOSIS — M4800 Spinal stenosis, site unspecified: Secondary | ICD-10-CM | POA: Diagnosis not present

## 2018-07-13 DIAGNOSIS — M4800 Spinal stenosis, site unspecified: Secondary | ICD-10-CM | POA: Diagnosis not present

## 2018-07-14 DIAGNOSIS — M4800 Spinal stenosis, site unspecified: Secondary | ICD-10-CM | POA: Diagnosis not present

## 2018-07-15 DIAGNOSIS — M4800 Spinal stenosis, site unspecified: Secondary | ICD-10-CM | POA: Diagnosis not present

## 2018-07-15 IMAGING — US US CAROTID DUPLEX BILAT
1 series · 13 of 24 positions shown · non-contrast
Comparison: CT 01/03/2017.  CT 11/24/2016.

CLINICAL DATA: Dizziness.

EXAM:
BILATERAL CAROTID DUPLEX ULTRASOUND
TECHNIQUE: Gray scale imaging, color Doppler and duplex ultrasound were
performed of bilateral carotid and vertebral arteries in the neck.

[Series 1: us carotid duplex bilat · 13 of 49 slices shown]
[im 1/49]
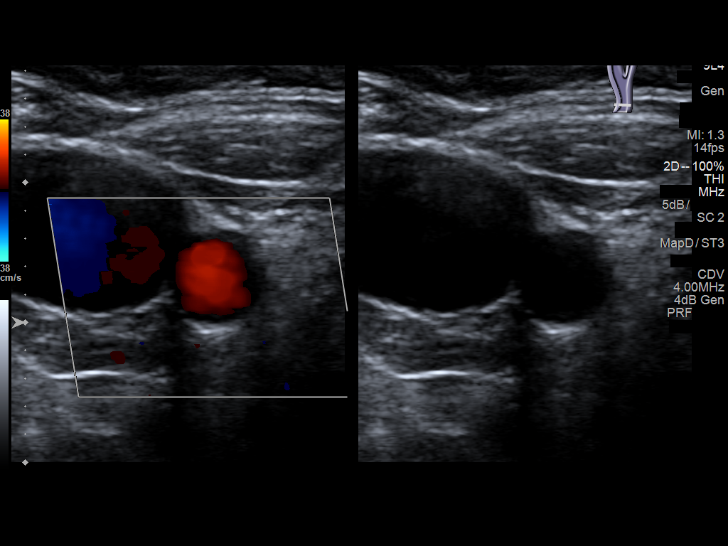
[im 5/49]
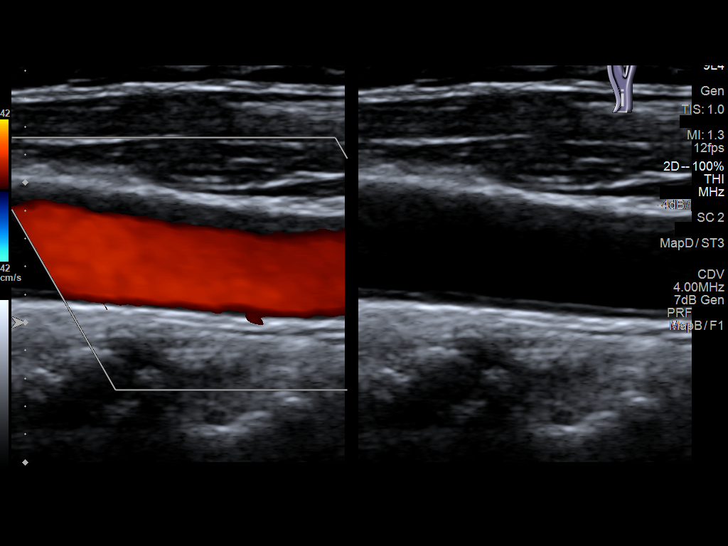
[im 9/49]
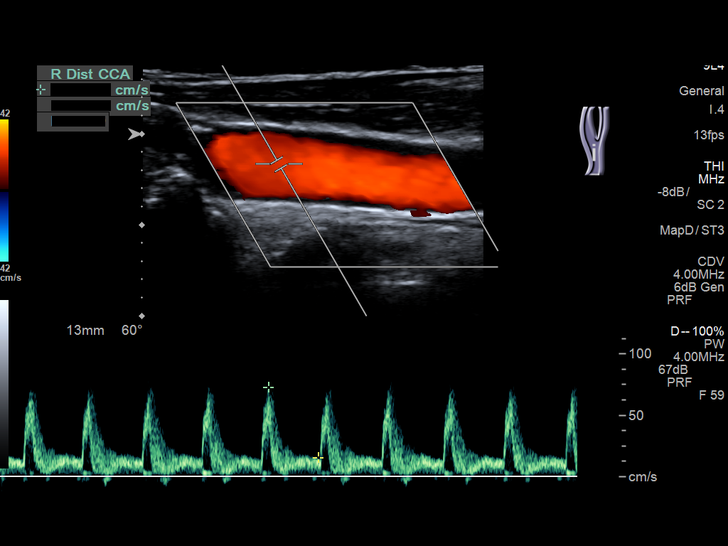
[im 13/49]
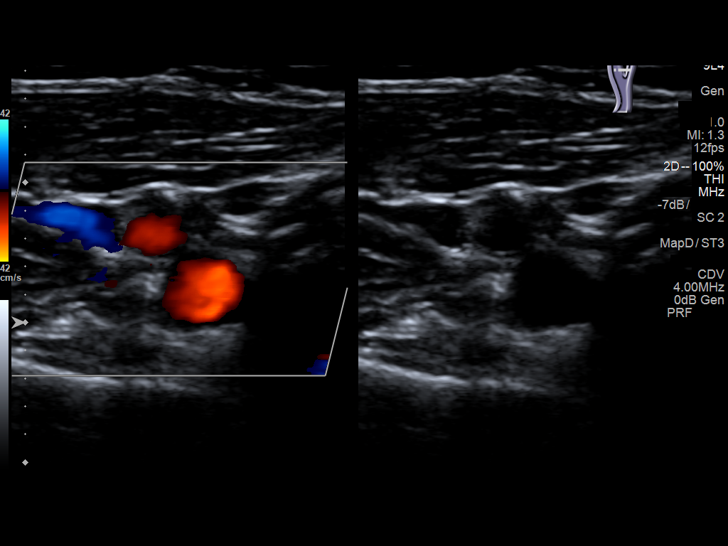
[im 17/49]
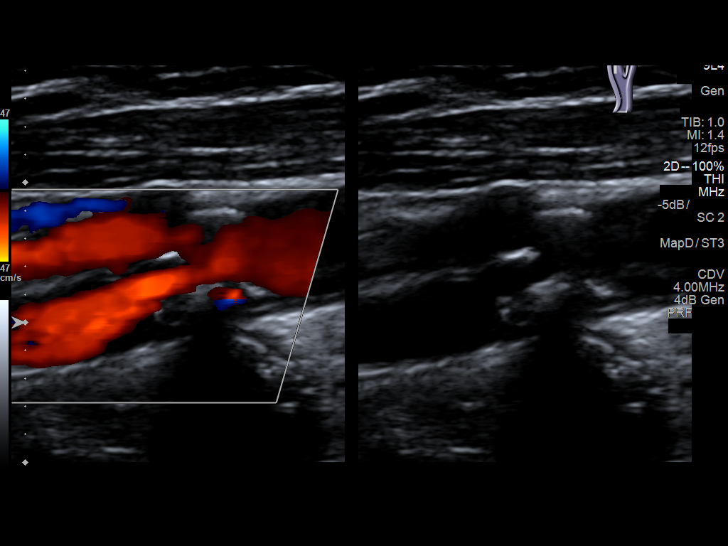
[im 21/49]
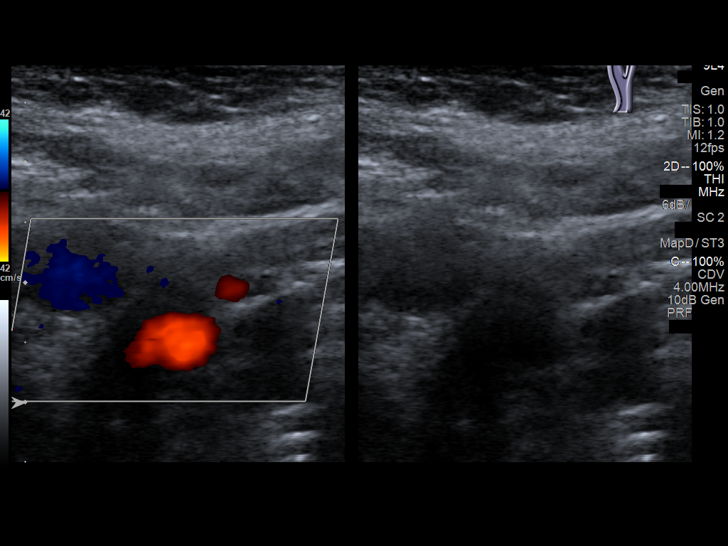
[im 26/49]
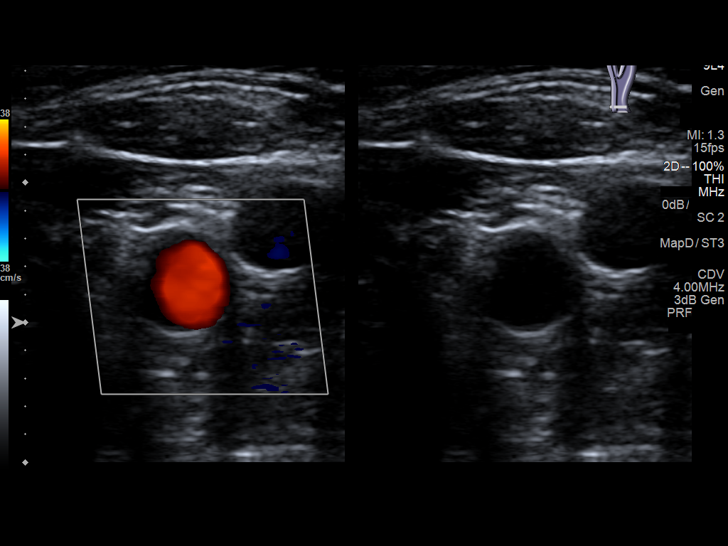
[im 28/49]
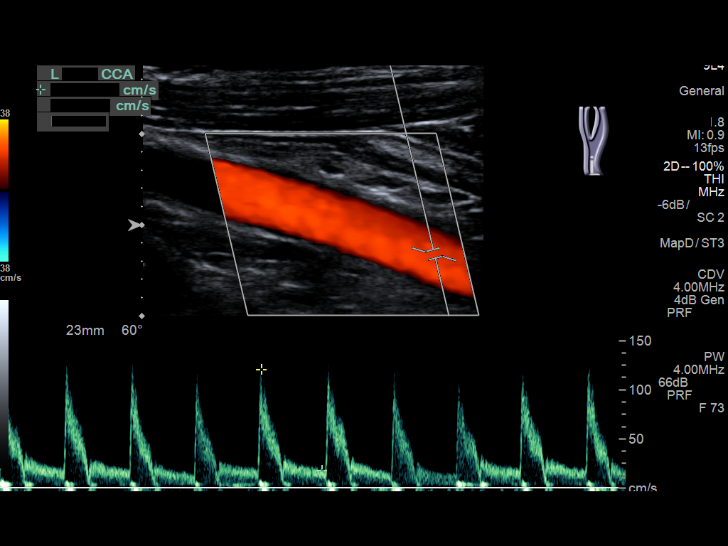
[im 32/49]
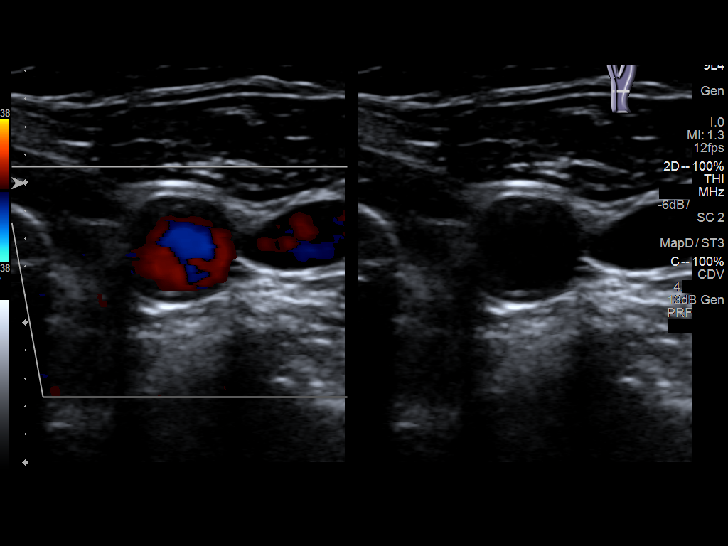
[im 36/49]
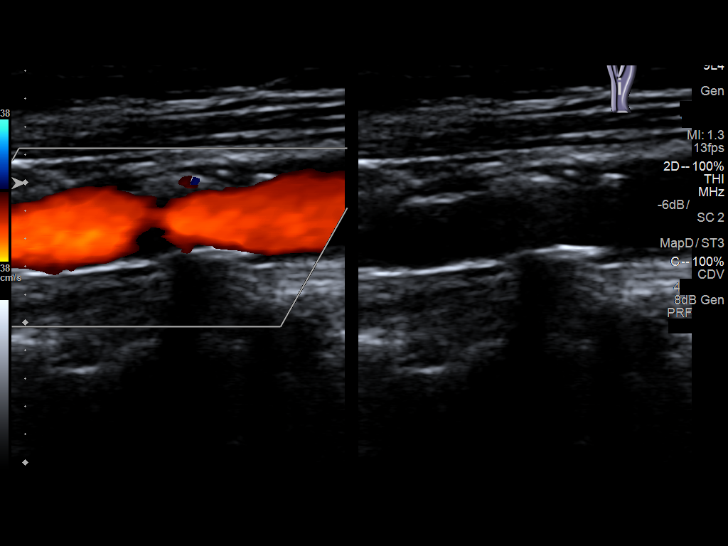
[im 40/49]
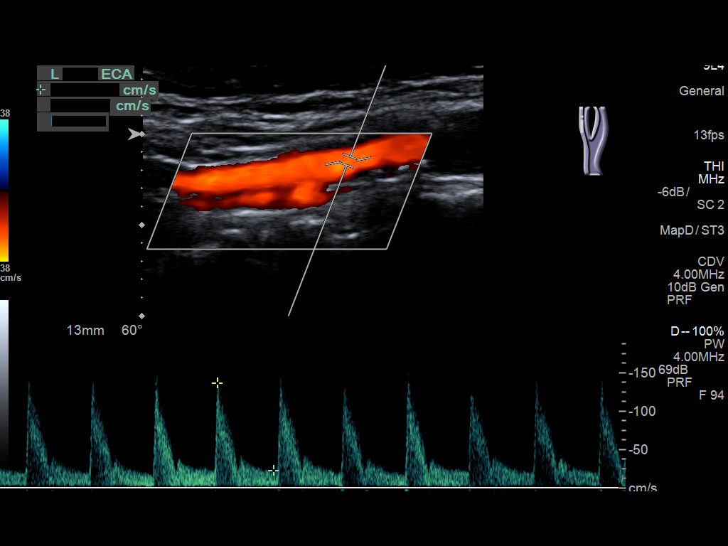
[im 44/49]
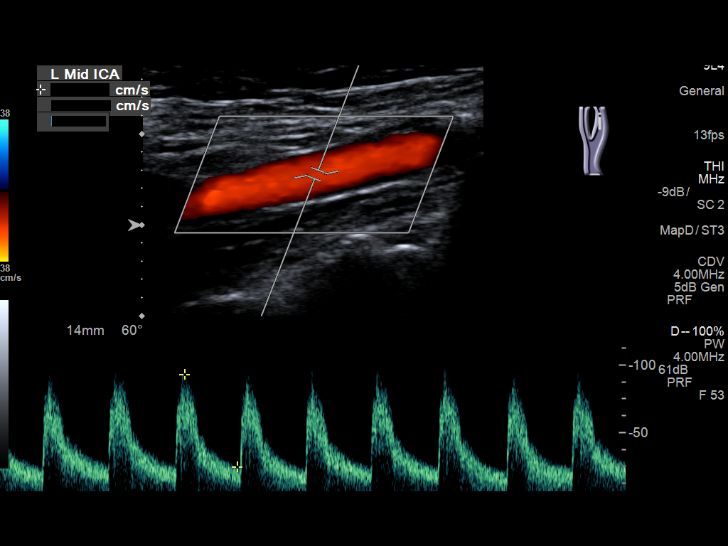
[im 49/49]
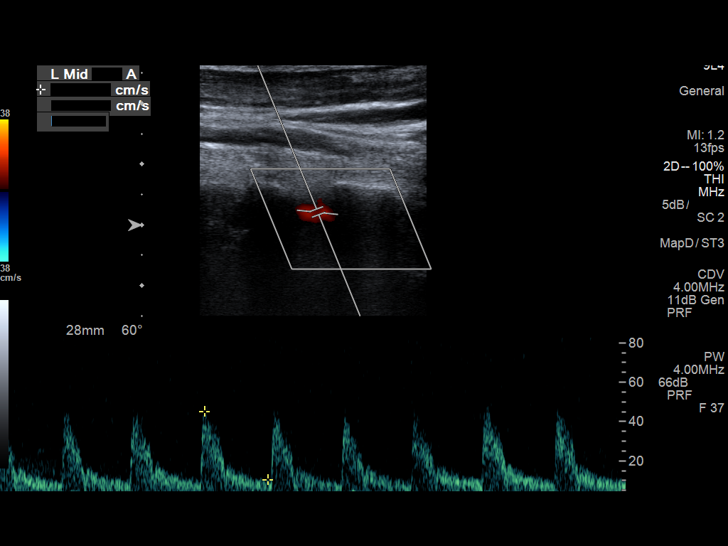

[13 of 24 positions shown; findings below may reference images not displayed]

FINDINGS: Criteria: Quantification of carotid stenosis is based on velocity
parameters that correlate the residual internal carotid diameter
with NASCET-based stenosis levels, using the diameter of the distal
internal carotid lumen as the denominator for stenosis measurement.

The following velocity measurements were obtained:

RIGHT

ICA:  162/35 cm/sec

CCA:  75/19 cm/sec

SYSTOLIC ICA/CCA RATIO:

DIASTOLIC ICA/CCA RATIO:

ECA:  102 cm/sec

LEFT

ICA:  99/32 cm/sec

CCA:  84/20 cm/sec

SYSTOLIC ICA/CCA RATIO:

DIASTOLIC ICA/CCA RATIO:

ECA:  137 cm/sec

RIGHT CAROTID ARTERY: Moderate right carotid bifurcation calcified
atherosclerotic vascular plaque. Degree of stenosis 50- 69%.

RIGHT VERTEBRAL ARTERY:  Patent with antegrade flow.

LEFT CAROTID ARTERY: Mild left carotid bifurcation calcified
atherosclerotic vascular plaque. Degree of stenosis less than 50%.

LEFT VERTEBRAL ARTERY:  Patent with antegrade flow.
IMPRESSION: 1. Moderate right carotid bifurcation calcified atherosclerotic
vascular plaque. Degree of stenosis 50-69%.

2. Mild left carotid bifurcation calcified atherosclerotic vascular
plaque. Degree of stenosis less than 50%.

3. Vertebrals are patent with antegrade flow.

## 2018-07-18 DIAGNOSIS — M4800 Spinal stenosis, site unspecified: Secondary | ICD-10-CM | POA: Diagnosis not present

## 2018-07-19 DIAGNOSIS — M4800 Spinal stenosis, site unspecified: Secondary | ICD-10-CM | POA: Diagnosis not present

## 2018-07-20 DIAGNOSIS — M4800 Spinal stenosis, site unspecified: Secondary | ICD-10-CM | POA: Diagnosis not present

## 2018-07-21 DIAGNOSIS — M4800 Spinal stenosis, site unspecified: Secondary | ICD-10-CM | POA: Diagnosis not present

## 2018-07-22 DIAGNOSIS — M4800 Spinal stenosis, site unspecified: Secondary | ICD-10-CM | POA: Diagnosis not present

## 2018-07-23 DIAGNOSIS — M4800 Spinal stenosis, site unspecified: Secondary | ICD-10-CM | POA: Diagnosis not present

## 2018-07-24 DIAGNOSIS — M4800 Spinal stenosis, site unspecified: Secondary | ICD-10-CM | POA: Diagnosis not present

## 2018-07-25 DIAGNOSIS — M4800 Spinal stenosis, site unspecified: Secondary | ICD-10-CM | POA: Diagnosis not present

## 2018-07-26 DIAGNOSIS — M4800 Spinal stenosis, site unspecified: Secondary | ICD-10-CM | POA: Diagnosis not present

## 2018-07-27 DIAGNOSIS — E039 Hypothyroidism, unspecified: Secondary | ICD-10-CM | POA: Diagnosis not present

## 2018-07-27 DIAGNOSIS — J309 Allergic rhinitis, unspecified: Secondary | ICD-10-CM | POA: Diagnosis not present

## 2018-07-27 DIAGNOSIS — J449 Chronic obstructive pulmonary disease, unspecified: Secondary | ICD-10-CM | POA: Diagnosis not present

## 2018-07-27 DIAGNOSIS — K589 Irritable bowel syndrome without diarrhea: Secondary | ICD-10-CM | POA: Diagnosis not present

## 2018-07-27 DIAGNOSIS — R69 Illness, unspecified: Secondary | ICD-10-CM | POA: Diagnosis not present

## 2018-07-27 DIAGNOSIS — E119 Type 2 diabetes mellitus without complications: Secondary | ICD-10-CM | POA: Diagnosis not present

## 2018-07-27 DIAGNOSIS — M810 Age-related osteoporosis without current pathological fracture: Secondary | ICD-10-CM | POA: Diagnosis not present

## 2018-07-27 DIAGNOSIS — M4800 Spinal stenosis, site unspecified: Secondary | ICD-10-CM | POA: Diagnosis not present

## 2018-07-27 DIAGNOSIS — M1990 Unspecified osteoarthritis, unspecified site: Secondary | ICD-10-CM | POA: Diagnosis not present

## 2018-07-27 DIAGNOSIS — E785 Hyperlipidemia, unspecified: Secondary | ICD-10-CM | POA: Diagnosis not present

## 2018-07-28 DIAGNOSIS — M4800 Spinal stenosis, site unspecified: Secondary | ICD-10-CM | POA: Diagnosis not present

## 2018-07-29 DIAGNOSIS — M4800 Spinal stenosis, site unspecified: Secondary | ICD-10-CM | POA: Diagnosis not present

## 2018-08-01 DIAGNOSIS — M4800 Spinal stenosis, site unspecified: Secondary | ICD-10-CM | POA: Diagnosis not present

## 2018-08-02 DIAGNOSIS — M4800 Spinal stenosis, site unspecified: Secondary | ICD-10-CM | POA: Diagnosis not present

## 2018-08-03 DIAGNOSIS — M4800 Spinal stenosis, site unspecified: Secondary | ICD-10-CM | POA: Diagnosis not present

## 2018-08-04 DIAGNOSIS — M4800 Spinal stenosis, site unspecified: Secondary | ICD-10-CM | POA: Diagnosis not present

## 2018-08-05 DIAGNOSIS — M4800 Spinal stenosis, site unspecified: Secondary | ICD-10-CM | POA: Diagnosis not present

## 2018-08-06 DIAGNOSIS — M4800 Spinal stenosis, site unspecified: Secondary | ICD-10-CM | POA: Diagnosis not present

## 2018-08-07 DIAGNOSIS — M4800 Spinal stenosis, site unspecified: Secondary | ICD-10-CM | POA: Diagnosis not present

## 2018-08-08 DIAGNOSIS — M4800 Spinal stenosis, site unspecified: Secondary | ICD-10-CM | POA: Diagnosis not present

## 2018-08-09 DIAGNOSIS — M4800 Spinal stenosis, site unspecified: Secondary | ICD-10-CM | POA: Diagnosis not present

## 2018-08-10 DIAGNOSIS — M4800 Spinal stenosis, site unspecified: Secondary | ICD-10-CM | POA: Diagnosis not present

## 2018-08-11 DIAGNOSIS — M4800 Spinal stenosis, site unspecified: Secondary | ICD-10-CM | POA: Diagnosis not present

## 2018-08-12 DIAGNOSIS — M4800 Spinal stenosis, site unspecified: Secondary | ICD-10-CM | POA: Diagnosis not present

## 2018-08-13 DIAGNOSIS — M4800 Spinal stenosis, site unspecified: Secondary | ICD-10-CM | POA: Diagnosis not present

## 2018-08-14 DIAGNOSIS — M4800 Spinal stenosis, site unspecified: Secondary | ICD-10-CM | POA: Diagnosis not present

## 2018-08-15 DIAGNOSIS — M4800 Spinal stenosis, site unspecified: Secondary | ICD-10-CM | POA: Diagnosis not present

## 2018-08-16 DIAGNOSIS — M4800 Spinal stenosis, site unspecified: Secondary | ICD-10-CM | POA: Diagnosis not present

## 2018-08-17 DIAGNOSIS — M4800 Spinal stenosis, site unspecified: Secondary | ICD-10-CM | POA: Diagnosis not present

## 2018-08-18 DIAGNOSIS — M4800 Spinal stenosis, site unspecified: Secondary | ICD-10-CM | POA: Diagnosis not present

## 2018-08-19 DIAGNOSIS — M4800 Spinal stenosis, site unspecified: Secondary | ICD-10-CM | POA: Diagnosis not present

## 2018-08-20 DIAGNOSIS — M4800 Spinal stenosis, site unspecified: Secondary | ICD-10-CM | POA: Diagnosis not present

## 2018-08-21 DIAGNOSIS — M4800 Spinal stenosis, site unspecified: Secondary | ICD-10-CM | POA: Diagnosis not present

## 2018-08-22 DIAGNOSIS — M4800 Spinal stenosis, site unspecified: Secondary | ICD-10-CM | POA: Diagnosis not present

## 2018-08-23 DIAGNOSIS — M4800 Spinal stenosis, site unspecified: Secondary | ICD-10-CM | POA: Diagnosis not present

## 2018-08-24 DIAGNOSIS — M4800 Spinal stenosis, site unspecified: Secondary | ICD-10-CM | POA: Diagnosis not present

## 2018-08-25 DIAGNOSIS — M4800 Spinal stenosis, site unspecified: Secondary | ICD-10-CM | POA: Diagnosis not present

## 2018-08-26 DIAGNOSIS — M4800 Spinal stenosis, site unspecified: Secondary | ICD-10-CM | POA: Diagnosis not present

## 2018-08-27 DIAGNOSIS — M4800 Spinal stenosis, site unspecified: Secondary | ICD-10-CM | POA: Diagnosis not present

## 2018-08-28 DIAGNOSIS — M4800 Spinal stenosis, site unspecified: Secondary | ICD-10-CM | POA: Diagnosis not present

## 2018-08-29 DIAGNOSIS — M4800 Spinal stenosis, site unspecified: Secondary | ICD-10-CM | POA: Diagnosis not present

## 2018-08-30 DIAGNOSIS — M4800 Spinal stenosis, site unspecified: Secondary | ICD-10-CM | POA: Diagnosis not present

## 2018-08-31 DIAGNOSIS — M4800 Spinal stenosis, site unspecified: Secondary | ICD-10-CM | POA: Diagnosis not present

## 2018-09-01 DIAGNOSIS — M4800 Spinal stenosis, site unspecified: Secondary | ICD-10-CM | POA: Diagnosis not present

## 2018-09-02 DIAGNOSIS — M4800 Spinal stenosis, site unspecified: Secondary | ICD-10-CM | POA: Diagnosis not present

## 2018-09-03 DIAGNOSIS — M4800 Spinal stenosis, site unspecified: Secondary | ICD-10-CM | POA: Diagnosis not present

## 2018-09-04 DIAGNOSIS — M4800 Spinal stenosis, site unspecified: Secondary | ICD-10-CM | POA: Diagnosis not present

## 2018-09-05 DIAGNOSIS — M4800 Spinal stenosis, site unspecified: Secondary | ICD-10-CM | POA: Diagnosis not present

## 2018-09-06 DIAGNOSIS — M4800 Spinal stenosis, site unspecified: Secondary | ICD-10-CM | POA: Diagnosis not present

## 2018-09-07 DIAGNOSIS — M4800 Spinal stenosis, site unspecified: Secondary | ICD-10-CM | POA: Diagnosis not present

## 2018-09-08 DIAGNOSIS — M4800 Spinal stenosis, site unspecified: Secondary | ICD-10-CM | POA: Diagnosis not present

## 2018-09-09 DIAGNOSIS — M4800 Spinal stenosis, site unspecified: Secondary | ICD-10-CM | POA: Diagnosis not present

## 2018-09-10 DIAGNOSIS — M4800 Spinal stenosis, site unspecified: Secondary | ICD-10-CM | POA: Diagnosis not present

## 2018-09-11 DIAGNOSIS — M4800 Spinal stenosis, site unspecified: Secondary | ICD-10-CM | POA: Diagnosis not present

## 2018-09-12 DIAGNOSIS — M4800 Spinal stenosis, site unspecified: Secondary | ICD-10-CM | POA: Diagnosis not present

## 2018-09-13 DIAGNOSIS — M4800 Spinal stenosis, site unspecified: Secondary | ICD-10-CM | POA: Diagnosis not present

## 2018-09-14 DIAGNOSIS — M4800 Spinal stenosis, site unspecified: Secondary | ICD-10-CM | POA: Diagnosis not present

## 2018-09-15 DIAGNOSIS — M4800 Spinal stenosis, site unspecified: Secondary | ICD-10-CM | POA: Diagnosis not present

## 2018-09-16 DIAGNOSIS — M4800 Spinal stenosis, site unspecified: Secondary | ICD-10-CM | POA: Diagnosis not present

## 2018-09-17 DIAGNOSIS — M4800 Spinal stenosis, site unspecified: Secondary | ICD-10-CM | POA: Diagnosis not present

## 2018-09-18 DIAGNOSIS — M4800 Spinal stenosis, site unspecified: Secondary | ICD-10-CM | POA: Diagnosis not present

## 2018-09-19 DIAGNOSIS — M4800 Spinal stenosis, site unspecified: Secondary | ICD-10-CM | POA: Diagnosis not present

## 2018-09-20 DIAGNOSIS — M4800 Spinal stenosis, site unspecified: Secondary | ICD-10-CM | POA: Diagnosis not present

## 2018-09-21 DIAGNOSIS — M4800 Spinal stenosis, site unspecified: Secondary | ICD-10-CM | POA: Diagnosis not present

## 2018-09-22 DIAGNOSIS — M4800 Spinal stenosis, site unspecified: Secondary | ICD-10-CM | POA: Diagnosis not present

## 2018-09-23 DIAGNOSIS — M4800 Spinal stenosis, site unspecified: Secondary | ICD-10-CM | POA: Diagnosis not present

## 2018-09-24 DIAGNOSIS — M4800 Spinal stenosis, site unspecified: Secondary | ICD-10-CM | POA: Diagnosis not present

## 2018-09-25 DIAGNOSIS — M4800 Spinal stenosis, site unspecified: Secondary | ICD-10-CM | POA: Diagnosis not present

## 2018-09-26 DIAGNOSIS — M4800 Spinal stenosis, site unspecified: Secondary | ICD-10-CM | POA: Diagnosis not present

## 2018-09-27 DIAGNOSIS — M4800 Spinal stenosis, site unspecified: Secondary | ICD-10-CM | POA: Diagnosis not present

## 2018-09-28 DIAGNOSIS — M4800 Spinal stenosis, site unspecified: Secondary | ICD-10-CM | POA: Diagnosis not present

## 2018-09-29 DIAGNOSIS — M4800 Spinal stenosis, site unspecified: Secondary | ICD-10-CM | POA: Diagnosis not present

## 2018-09-30 DIAGNOSIS — M4800 Spinal stenosis, site unspecified: Secondary | ICD-10-CM | POA: Diagnosis not present

## 2018-10-01 DIAGNOSIS — M4800 Spinal stenosis, site unspecified: Secondary | ICD-10-CM | POA: Diagnosis not present

## 2018-10-02 DIAGNOSIS — M4800 Spinal stenosis, site unspecified: Secondary | ICD-10-CM | POA: Diagnosis not present

## 2018-10-03 DIAGNOSIS — M4800 Spinal stenosis, site unspecified: Secondary | ICD-10-CM | POA: Diagnosis not present

## 2018-10-04 DIAGNOSIS — M4800 Spinal stenosis, site unspecified: Secondary | ICD-10-CM | POA: Diagnosis not present

## 2018-10-05 DIAGNOSIS — M4800 Spinal stenosis, site unspecified: Secondary | ICD-10-CM | POA: Diagnosis not present

## 2018-10-06 DIAGNOSIS — M4800 Spinal stenosis, site unspecified: Secondary | ICD-10-CM | POA: Diagnosis not present

## 2018-10-07 DIAGNOSIS — M4800 Spinal stenosis, site unspecified: Secondary | ICD-10-CM | POA: Diagnosis not present

## 2018-10-08 DIAGNOSIS — M4800 Spinal stenosis, site unspecified: Secondary | ICD-10-CM | POA: Diagnosis not present

## 2018-10-09 DIAGNOSIS — M4800 Spinal stenosis, site unspecified: Secondary | ICD-10-CM | POA: Diagnosis not present

## 2018-10-10 DIAGNOSIS — M4800 Spinal stenosis, site unspecified: Secondary | ICD-10-CM | POA: Diagnosis not present

## 2018-10-11 DIAGNOSIS — M4800 Spinal stenosis, site unspecified: Secondary | ICD-10-CM | POA: Diagnosis not present

## 2018-10-12 DIAGNOSIS — M4800 Spinal stenosis, site unspecified: Secondary | ICD-10-CM | POA: Diagnosis not present

## 2018-10-12 DIAGNOSIS — F331 Major depressive disorder, recurrent, moderate: Secondary | ICD-10-CM | POA: Diagnosis not present

## 2018-10-12 DIAGNOSIS — R69 Illness, unspecified: Secondary | ICD-10-CM | POA: Diagnosis not present

## 2018-10-13 DIAGNOSIS — M4800 Spinal stenosis, site unspecified: Secondary | ICD-10-CM | POA: Diagnosis not present

## 2018-10-14 DIAGNOSIS — M4800 Spinal stenosis, site unspecified: Secondary | ICD-10-CM | POA: Diagnosis not present

## 2018-10-15 DIAGNOSIS — M4800 Spinal stenosis, site unspecified: Secondary | ICD-10-CM | POA: Diagnosis not present

## 2018-10-16 DIAGNOSIS — M4800 Spinal stenosis, site unspecified: Secondary | ICD-10-CM | POA: Diagnosis not present

## 2018-10-17 DIAGNOSIS — M4800 Spinal stenosis, site unspecified: Secondary | ICD-10-CM | POA: Diagnosis not present

## 2018-10-18 DIAGNOSIS — M4800 Spinal stenosis, site unspecified: Secondary | ICD-10-CM | POA: Diagnosis not present

## 2018-10-19 DIAGNOSIS — M4800 Spinal stenosis, site unspecified: Secondary | ICD-10-CM | POA: Diagnosis not present

## 2018-10-20 DIAGNOSIS — M4800 Spinal stenosis, site unspecified: Secondary | ICD-10-CM | POA: Diagnosis not present

## 2018-10-21 DIAGNOSIS — M4800 Spinal stenosis, site unspecified: Secondary | ICD-10-CM | POA: Diagnosis not present

## 2018-10-22 DIAGNOSIS — M4800 Spinal stenosis, site unspecified: Secondary | ICD-10-CM | POA: Diagnosis not present

## 2018-10-23 DIAGNOSIS — M4800 Spinal stenosis, site unspecified: Secondary | ICD-10-CM | POA: Diagnosis not present

## 2018-10-24 DIAGNOSIS — M4800 Spinal stenosis, site unspecified: Secondary | ICD-10-CM | POA: Diagnosis not present

## 2018-10-25 DIAGNOSIS — M4800 Spinal stenosis, site unspecified: Secondary | ICD-10-CM | POA: Diagnosis not present

## 2018-10-26 DIAGNOSIS — M4800 Spinal stenosis, site unspecified: Secondary | ICD-10-CM | POA: Diagnosis not present

## 2018-10-27 DIAGNOSIS — Z0000 Encounter for general adult medical examination without abnormal findings: Secondary | ICD-10-CM | POA: Diagnosis not present

## 2018-10-27 DIAGNOSIS — I6523 Occlusion and stenosis of bilateral carotid arteries: Secondary | ICD-10-CM | POA: Diagnosis not present

## 2018-10-27 DIAGNOSIS — E119 Type 2 diabetes mellitus without complications: Secondary | ICD-10-CM | POA: Diagnosis not present

## 2018-10-27 DIAGNOSIS — M4800 Spinal stenosis, site unspecified: Secondary | ICD-10-CM | POA: Diagnosis not present

## 2018-10-27 DIAGNOSIS — E782 Mixed hyperlipidemia: Secondary | ICD-10-CM | POA: Diagnosis not present

## 2018-10-28 DIAGNOSIS — M4800 Spinal stenosis, site unspecified: Secondary | ICD-10-CM | POA: Diagnosis not present

## 2018-10-29 DIAGNOSIS — M4800 Spinal stenosis, site unspecified: Secondary | ICD-10-CM | POA: Diagnosis not present

## 2018-10-30 DIAGNOSIS — M4800 Spinal stenosis, site unspecified: Secondary | ICD-10-CM | POA: Diagnosis not present

## 2018-10-31 DIAGNOSIS — M4800 Spinal stenosis, site unspecified: Secondary | ICD-10-CM | POA: Diagnosis not present

## 2018-11-01 DIAGNOSIS — M4800 Spinal stenosis, site unspecified: Secondary | ICD-10-CM | POA: Diagnosis not present

## 2018-11-02 DIAGNOSIS — M4800 Spinal stenosis, site unspecified: Secondary | ICD-10-CM | POA: Diagnosis not present

## 2018-11-03 DIAGNOSIS — J449 Chronic obstructive pulmonary disease, unspecified: Secondary | ICD-10-CM | POA: Diagnosis not present

## 2018-11-03 DIAGNOSIS — M4800 Spinal stenosis, site unspecified: Secondary | ICD-10-CM | POA: Diagnosis not present

## 2018-11-03 DIAGNOSIS — G479 Sleep disorder, unspecified: Secondary | ICD-10-CM | POA: Diagnosis not present

## 2018-11-04 DIAGNOSIS — M4800 Spinal stenosis, site unspecified: Secondary | ICD-10-CM | POA: Diagnosis not present

## 2018-11-05 DIAGNOSIS — M4800 Spinal stenosis, site unspecified: Secondary | ICD-10-CM | POA: Diagnosis not present

## 2018-11-06 DIAGNOSIS — M4800 Spinal stenosis, site unspecified: Secondary | ICD-10-CM | POA: Diagnosis not present

## 2018-11-07 DIAGNOSIS — M4800 Spinal stenosis, site unspecified: Secondary | ICD-10-CM | POA: Diagnosis not present

## 2018-11-08 DIAGNOSIS — M4800 Spinal stenosis, site unspecified: Secondary | ICD-10-CM | POA: Diagnosis not present

## 2018-11-09 DIAGNOSIS — M4800 Spinal stenosis, site unspecified: Secondary | ICD-10-CM | POA: Diagnosis not present

## 2018-11-10 DIAGNOSIS — E039 Hypothyroidism, unspecified: Secondary | ICD-10-CM | POA: Diagnosis not present

## 2018-11-10 DIAGNOSIS — M810 Age-related osteoporosis without current pathological fracture: Secondary | ICD-10-CM | POA: Diagnosis not present

## 2018-11-10 DIAGNOSIS — J449 Chronic obstructive pulmonary disease, unspecified: Secondary | ICD-10-CM | POA: Diagnosis not present

## 2018-11-10 DIAGNOSIS — M4800 Spinal stenosis, site unspecified: Secondary | ICD-10-CM | POA: Diagnosis not present

## 2018-11-10 DIAGNOSIS — R5383 Other fatigue: Secondary | ICD-10-CM | POA: Diagnosis not present

## 2018-11-10 DIAGNOSIS — E119 Type 2 diabetes mellitus without complications: Secondary | ICD-10-CM | POA: Diagnosis not present

## 2018-11-10 DIAGNOSIS — E875 Hyperkalemia: Secondary | ICD-10-CM | POA: Diagnosis not present

## 2018-11-10 DIAGNOSIS — L989 Disorder of the skin and subcutaneous tissue, unspecified: Secondary | ICD-10-CM | POA: Diagnosis not present

## 2018-11-10 DIAGNOSIS — E782 Mixed hyperlipidemia: Secondary | ICD-10-CM | POA: Diagnosis not present

## 2018-11-11 DIAGNOSIS — M4800 Spinal stenosis, site unspecified: Secondary | ICD-10-CM | POA: Diagnosis not present

## 2018-11-12 DIAGNOSIS — M4800 Spinal stenosis, site unspecified: Secondary | ICD-10-CM | POA: Diagnosis not present

## 2018-11-13 DIAGNOSIS — M4800 Spinal stenosis, site unspecified: Secondary | ICD-10-CM | POA: Diagnosis not present

## 2018-11-14 DIAGNOSIS — M4800 Spinal stenosis, site unspecified: Secondary | ICD-10-CM | POA: Diagnosis not present

## 2018-11-15 DIAGNOSIS — M4800 Spinal stenosis, site unspecified: Secondary | ICD-10-CM | POA: Diagnosis not present

## 2018-11-16 DIAGNOSIS — M4800 Spinal stenosis, site unspecified: Secondary | ICD-10-CM | POA: Diagnosis not present

## 2018-11-17 DIAGNOSIS — M4800 Spinal stenosis, site unspecified: Secondary | ICD-10-CM | POA: Diagnosis not present

## 2018-11-18 DIAGNOSIS — M4800 Spinal stenosis, site unspecified: Secondary | ICD-10-CM | POA: Diagnosis not present

## 2018-11-19 DIAGNOSIS — M4800 Spinal stenosis, site unspecified: Secondary | ICD-10-CM | POA: Diagnosis not present

## 2018-11-20 DIAGNOSIS — M4800 Spinal stenosis, site unspecified: Secondary | ICD-10-CM | POA: Diagnosis not present

## 2018-11-21 DIAGNOSIS — M4800 Spinal stenosis, site unspecified: Secondary | ICD-10-CM | POA: Diagnosis not present

## 2018-11-22 DIAGNOSIS — M4800 Spinal stenosis, site unspecified: Secondary | ICD-10-CM | POA: Diagnosis not present

## 2018-11-23 DIAGNOSIS — M4800 Spinal stenosis, site unspecified: Secondary | ICD-10-CM | POA: Diagnosis not present

## 2018-11-24 DIAGNOSIS — M4800 Spinal stenosis, site unspecified: Secondary | ICD-10-CM | POA: Diagnosis not present

## 2018-11-25 DIAGNOSIS — M4800 Spinal stenosis, site unspecified: Secondary | ICD-10-CM | POA: Diagnosis not present

## 2018-11-26 DIAGNOSIS — M4800 Spinal stenosis, site unspecified: Secondary | ICD-10-CM | POA: Diagnosis not present

## 2018-11-27 DIAGNOSIS — M4800 Spinal stenosis, site unspecified: Secondary | ICD-10-CM | POA: Diagnosis not present

## 2018-11-28 DIAGNOSIS — M4800 Spinal stenosis, site unspecified: Secondary | ICD-10-CM | POA: Diagnosis not present

## 2018-11-29 DIAGNOSIS — E875 Hyperkalemia: Secondary | ICD-10-CM | POA: Diagnosis not present

## 2018-11-29 DIAGNOSIS — M810 Age-related osteoporosis without current pathological fracture: Secondary | ICD-10-CM | POA: Diagnosis not present

## 2018-11-29 DIAGNOSIS — E538 Deficiency of other specified B group vitamins: Secondary | ICD-10-CM | POA: Diagnosis not present

## 2018-11-29 DIAGNOSIS — E039 Hypothyroidism, unspecified: Secondary | ICD-10-CM | POA: Diagnosis not present

## 2018-11-29 DIAGNOSIS — M4800 Spinal stenosis, site unspecified: Secondary | ICD-10-CM | POA: Diagnosis not present

## 2018-11-29 DIAGNOSIS — R5383 Other fatigue: Secondary | ICD-10-CM | POA: Diagnosis not present

## 2018-11-30 DIAGNOSIS — M4800 Spinal stenosis, site unspecified: Secondary | ICD-10-CM | POA: Diagnosis not present

## 2018-12-01 DIAGNOSIS — M4800 Spinal stenosis, site unspecified: Secondary | ICD-10-CM | POA: Diagnosis not present

## 2018-12-02 DIAGNOSIS — M4800 Spinal stenosis, site unspecified: Secondary | ICD-10-CM | POA: Diagnosis not present

## 2018-12-03 DIAGNOSIS — M4800 Spinal stenosis, site unspecified: Secondary | ICD-10-CM | POA: Diagnosis not present

## 2018-12-04 DIAGNOSIS — M4800 Spinal stenosis, site unspecified: Secondary | ICD-10-CM | POA: Diagnosis not present

## 2018-12-05 DIAGNOSIS — M4800 Spinal stenosis, site unspecified: Secondary | ICD-10-CM | POA: Diagnosis not present

## 2018-12-06 DIAGNOSIS — M4800 Spinal stenosis, site unspecified: Secondary | ICD-10-CM | POA: Diagnosis not present

## 2018-12-07 DIAGNOSIS — M4800 Spinal stenosis, site unspecified: Secondary | ICD-10-CM | POA: Diagnosis not present

## 2018-12-08 DIAGNOSIS — M4800 Spinal stenosis, site unspecified: Secondary | ICD-10-CM | POA: Diagnosis not present

## 2018-12-09 DIAGNOSIS — M4800 Spinal stenosis, site unspecified: Secondary | ICD-10-CM | POA: Diagnosis not present

## 2018-12-12 DIAGNOSIS — M4800 Spinal stenosis, site unspecified: Secondary | ICD-10-CM | POA: Diagnosis not present

## 2018-12-13 DIAGNOSIS — M4800 Spinal stenosis, site unspecified: Secondary | ICD-10-CM | POA: Diagnosis not present

## 2018-12-14 DIAGNOSIS — M4800 Spinal stenosis, site unspecified: Secondary | ICD-10-CM | POA: Diagnosis not present

## 2018-12-15 DIAGNOSIS — M4800 Spinal stenosis, site unspecified: Secondary | ICD-10-CM | POA: Diagnosis not present

## 2018-12-16 DIAGNOSIS — M4800 Spinal stenosis, site unspecified: Secondary | ICD-10-CM | POA: Diagnosis not present

## 2018-12-17 DIAGNOSIS — M4800 Spinal stenosis, site unspecified: Secondary | ICD-10-CM | POA: Diagnosis not present

## 2018-12-18 DIAGNOSIS — M4800 Spinal stenosis, site unspecified: Secondary | ICD-10-CM | POA: Diagnosis not present

## 2018-12-19 DIAGNOSIS — M4800 Spinal stenosis, site unspecified: Secondary | ICD-10-CM | POA: Diagnosis not present

## 2018-12-20 DIAGNOSIS — M4800 Spinal stenosis, site unspecified: Secondary | ICD-10-CM | POA: Diagnosis not present

## 2018-12-21 DIAGNOSIS — M4800 Spinal stenosis, site unspecified: Secondary | ICD-10-CM | POA: Diagnosis not present

## 2018-12-22 DIAGNOSIS — M4800 Spinal stenosis, site unspecified: Secondary | ICD-10-CM | POA: Diagnosis not present

## 2018-12-23 DIAGNOSIS — M4800 Spinal stenosis, site unspecified: Secondary | ICD-10-CM | POA: Diagnosis not present

## 2018-12-24 DIAGNOSIS — M4800 Spinal stenosis, site unspecified: Secondary | ICD-10-CM | POA: Diagnosis not present

## 2018-12-25 DIAGNOSIS — M4800 Spinal stenosis, site unspecified: Secondary | ICD-10-CM | POA: Diagnosis not present

## 2018-12-26 DIAGNOSIS — M4800 Spinal stenosis, site unspecified: Secondary | ICD-10-CM | POA: Diagnosis not present

## 2018-12-27 DIAGNOSIS — M4800 Spinal stenosis, site unspecified: Secondary | ICD-10-CM | POA: Diagnosis not present

## 2018-12-28 DIAGNOSIS — M4800 Spinal stenosis, site unspecified: Secondary | ICD-10-CM | POA: Diagnosis not present

## 2018-12-29 DIAGNOSIS — M4800 Spinal stenosis, site unspecified: Secondary | ICD-10-CM | POA: Diagnosis not present

## 2018-12-30 DIAGNOSIS — M4800 Spinal stenosis, site unspecified: Secondary | ICD-10-CM | POA: Diagnosis not present

## 2018-12-31 DIAGNOSIS — M4800 Spinal stenosis, site unspecified: Secondary | ICD-10-CM | POA: Diagnosis not present

## 2019-01-01 DIAGNOSIS — M4800 Spinal stenosis, site unspecified: Secondary | ICD-10-CM | POA: Diagnosis not present

## 2019-01-04 DIAGNOSIS — J449 Chronic obstructive pulmonary disease, unspecified: Secondary | ICD-10-CM | POA: Diagnosis not present

## 2019-01-04 DIAGNOSIS — H8110 Benign paroxysmal vertigo, unspecified ear: Secondary | ICD-10-CM | POA: Diagnosis not present

## 2019-01-09 DIAGNOSIS — M4800 Spinal stenosis, site unspecified: Secondary | ICD-10-CM | POA: Diagnosis not present

## 2019-01-10 DIAGNOSIS — M4800 Spinal stenosis, site unspecified: Secondary | ICD-10-CM | POA: Diagnosis not present

## 2019-01-11 DIAGNOSIS — M4800 Spinal stenosis, site unspecified: Secondary | ICD-10-CM | POA: Diagnosis not present

## 2019-01-11 DIAGNOSIS — R69 Illness, unspecified: Secondary | ICD-10-CM | POA: Diagnosis not present

## 2019-01-12 DIAGNOSIS — M4800 Spinal stenosis, site unspecified: Secondary | ICD-10-CM | POA: Diagnosis not present

## 2019-01-13 DIAGNOSIS — M4800 Spinal stenosis, site unspecified: Secondary | ICD-10-CM | POA: Diagnosis not present

## 2019-01-16 DIAGNOSIS — M4800 Spinal stenosis, site unspecified: Secondary | ICD-10-CM | POA: Diagnosis not present

## 2019-01-17 DIAGNOSIS — M4800 Spinal stenosis, site unspecified: Secondary | ICD-10-CM | POA: Diagnosis not present

## 2019-01-18 DIAGNOSIS — M4800 Spinal stenosis, site unspecified: Secondary | ICD-10-CM | POA: Diagnosis not present

## 2019-01-19 DIAGNOSIS — M4800 Spinal stenosis, site unspecified: Secondary | ICD-10-CM | POA: Diagnosis not present

## 2019-01-20 DIAGNOSIS — M4800 Spinal stenosis, site unspecified: Secondary | ICD-10-CM | POA: Diagnosis not present

## 2019-01-23 DIAGNOSIS — M4800 Spinal stenosis, site unspecified: Secondary | ICD-10-CM | POA: Diagnosis not present

## 2019-01-24 DIAGNOSIS — M4800 Spinal stenosis, site unspecified: Secondary | ICD-10-CM | POA: Diagnosis not present

## 2019-01-25 DIAGNOSIS — M4800 Spinal stenosis, site unspecified: Secondary | ICD-10-CM | POA: Diagnosis not present

## 2019-01-26 DIAGNOSIS — M4800 Spinal stenosis, site unspecified: Secondary | ICD-10-CM | POA: Diagnosis not present

## 2019-01-27 DIAGNOSIS — M4800 Spinal stenosis, site unspecified: Secondary | ICD-10-CM | POA: Diagnosis not present

## 2019-01-30 DIAGNOSIS — M4800 Spinal stenosis, site unspecified: Secondary | ICD-10-CM | POA: Diagnosis not present

## 2019-01-31 DIAGNOSIS — M4800 Spinal stenosis, site unspecified: Secondary | ICD-10-CM | POA: Diagnosis not present

## 2019-02-01 DIAGNOSIS — M4800 Spinal stenosis, site unspecified: Secondary | ICD-10-CM | POA: Diagnosis not present

## 2019-02-02 DIAGNOSIS — M4800 Spinal stenosis, site unspecified: Secondary | ICD-10-CM | POA: Diagnosis not present

## 2019-02-03 DIAGNOSIS — M4800 Spinal stenosis, site unspecified: Secondary | ICD-10-CM | POA: Diagnosis not present

## 2019-02-04 DIAGNOSIS — M4800 Spinal stenosis, site unspecified: Secondary | ICD-10-CM | POA: Diagnosis not present

## 2019-02-05 DIAGNOSIS — M4800 Spinal stenosis, site unspecified: Secondary | ICD-10-CM | POA: Diagnosis not present

## 2019-02-06 DIAGNOSIS — M4800 Spinal stenosis, site unspecified: Secondary | ICD-10-CM | POA: Diagnosis not present

## 2019-02-07 DIAGNOSIS — M4800 Spinal stenosis, site unspecified: Secondary | ICD-10-CM | POA: Diagnosis not present

## 2019-02-08 DIAGNOSIS — M4800 Spinal stenosis, site unspecified: Secondary | ICD-10-CM | POA: Diagnosis not present

## 2019-02-09 DIAGNOSIS — M4800 Spinal stenosis, site unspecified: Secondary | ICD-10-CM | POA: Diagnosis not present

## 2019-02-10 DIAGNOSIS — M4800 Spinal stenosis, site unspecified: Secondary | ICD-10-CM | POA: Diagnosis not present

## 2019-02-11 DIAGNOSIS — M4800 Spinal stenosis, site unspecified: Secondary | ICD-10-CM | POA: Diagnosis not present

## 2019-02-12 DIAGNOSIS — M4800 Spinal stenosis, site unspecified: Secondary | ICD-10-CM | POA: Diagnosis not present

## 2019-02-13 DIAGNOSIS — M4800 Spinal stenosis, site unspecified: Secondary | ICD-10-CM | POA: Diagnosis not present

## 2019-02-14 DIAGNOSIS — M4800 Spinal stenosis, site unspecified: Secondary | ICD-10-CM | POA: Diagnosis not present

## 2019-02-15 DIAGNOSIS — M4800 Spinal stenosis, site unspecified: Secondary | ICD-10-CM | POA: Diagnosis not present

## 2019-02-16 DIAGNOSIS — M4800 Spinal stenosis, site unspecified: Secondary | ICD-10-CM | POA: Diagnosis not present

## 2019-02-17 DIAGNOSIS — M4800 Spinal stenosis, site unspecified: Secondary | ICD-10-CM | POA: Diagnosis not present

## 2019-02-18 DIAGNOSIS — M4800 Spinal stenosis, site unspecified: Secondary | ICD-10-CM | POA: Diagnosis not present

## 2019-02-19 DIAGNOSIS — M4800 Spinal stenosis, site unspecified: Secondary | ICD-10-CM | POA: Diagnosis not present

## 2019-02-20 DIAGNOSIS — M4800 Spinal stenosis, site unspecified: Secondary | ICD-10-CM | POA: Diagnosis not present

## 2019-02-21 DIAGNOSIS — M4800 Spinal stenosis, site unspecified: Secondary | ICD-10-CM | POA: Diagnosis not present

## 2019-02-22 ENCOUNTER — Other Ambulatory Visit: Payer: Self-pay

## 2019-02-22 DIAGNOSIS — M4800 Spinal stenosis, site unspecified: Secondary | ICD-10-CM | POA: Diagnosis not present

## 2019-02-23 DIAGNOSIS — M4800 Spinal stenosis, site unspecified: Secondary | ICD-10-CM | POA: Diagnosis not present

## 2019-02-24 DIAGNOSIS — M4800 Spinal stenosis, site unspecified: Secondary | ICD-10-CM | POA: Diagnosis not present

## 2019-02-25 DIAGNOSIS — M4800 Spinal stenosis, site unspecified: Secondary | ICD-10-CM | POA: Diagnosis not present

## 2019-02-26 DIAGNOSIS — M4800 Spinal stenosis, site unspecified: Secondary | ICD-10-CM | POA: Diagnosis not present

## 2019-02-27 DIAGNOSIS — M4800 Spinal stenosis, site unspecified: Secondary | ICD-10-CM | POA: Diagnosis not present

## 2019-02-28 DIAGNOSIS — M4800 Spinal stenosis, site unspecified: Secondary | ICD-10-CM | POA: Diagnosis not present

## 2019-03-01 DIAGNOSIS — M4800 Spinal stenosis, site unspecified: Secondary | ICD-10-CM | POA: Diagnosis not present

## 2019-03-02 DIAGNOSIS — M4800 Spinal stenosis, site unspecified: Secondary | ICD-10-CM | POA: Diagnosis not present

## 2019-03-03 DIAGNOSIS — M4800 Spinal stenosis, site unspecified: Secondary | ICD-10-CM | POA: Diagnosis not present

## 2019-03-04 DIAGNOSIS — M4800 Spinal stenosis, site unspecified: Secondary | ICD-10-CM | POA: Diagnosis not present

## 2019-03-05 DIAGNOSIS — M4800 Spinal stenosis, site unspecified: Secondary | ICD-10-CM | POA: Diagnosis not present

## 2019-03-06 DIAGNOSIS — M4800 Spinal stenosis, site unspecified: Secondary | ICD-10-CM | POA: Diagnosis not present

## 2019-03-07 DIAGNOSIS — M4800 Spinal stenosis, site unspecified: Secondary | ICD-10-CM | POA: Diagnosis not present

## 2019-03-08 DIAGNOSIS — M4800 Spinal stenosis, site unspecified: Secondary | ICD-10-CM | POA: Diagnosis not present

## 2019-03-09 ENCOUNTER — Other Ambulatory Visit: Payer: Self-pay

## 2019-03-09 ENCOUNTER — Encounter: Payer: Self-pay | Admitting: Emergency Medicine

## 2019-03-09 ENCOUNTER — Emergency Department
Admission: EM | Admit: 2019-03-09 | Discharge: 2019-03-09 | Disposition: A | Discharge status: Home / Self Care | Payer: Medicare HMO | Source: Home / Self Care | Attending: Emergency Medicine | Admitting: Emergency Medicine

## 2019-03-09 ENCOUNTER — Emergency Department: Payer: Medicare HMO

## 2019-03-09 DIAGNOSIS — R0602 Shortness of breath: Secondary | ICD-10-CM | POA: Diagnosis not present

## 2019-03-09 DIAGNOSIS — R42 Dizziness and giddiness: Secondary | ICD-10-CM | POA: Diagnosis not present

## 2019-03-09 DIAGNOSIS — I10 Essential (primary) hypertension: Secondary | ICD-10-CM | POA: Diagnosis not present

## 2019-03-09 DIAGNOSIS — R062 Wheezing: Secondary | ICD-10-CM | POA: Diagnosis not present

## 2019-03-09 DIAGNOSIS — R634 Abnormal weight loss: Secondary | ICD-10-CM | POA: Insufficient documentation

## 2019-03-09 DIAGNOSIS — R110 Nausea: Secondary | ICD-10-CM

## 2019-03-09 DIAGNOSIS — I509 Heart failure, unspecified: Secondary | ICD-10-CM | POA: Insufficient documentation

## 2019-03-09 DIAGNOSIS — Z87891 Personal history of nicotine dependence: Secondary | ICD-10-CM | POA: Insufficient documentation

## 2019-03-09 DIAGNOSIS — R410 Disorientation, unspecified: Secondary | ICD-10-CM | POA: Diagnosis not present

## 2019-03-09 DIAGNOSIS — G9349 Other encephalopathy: Secondary | ICD-10-CM | POA: Diagnosis not present

## 2019-03-09 DIAGNOSIS — I110 Hypertensive heart disease with heart failure: Secondary | ICD-10-CM | POA: Insufficient documentation

## 2019-03-09 DIAGNOSIS — E162 Hypoglycemia, unspecified: Secondary | ICD-10-CM | POA: Diagnosis not present

## 2019-03-09 DIAGNOSIS — M4800 Spinal stenosis, site unspecified: Secondary | ICD-10-CM | POA: Diagnosis not present

## 2019-03-09 DIAGNOSIS — E161 Other hypoglycemia: Secondary | ICD-10-CM | POA: Diagnosis not present

## 2019-03-09 LAB — TROPONIN I (HIGH SENSITIVITY): Troponin I (High Sensitivity): <2 ng/L (ref <18)

## 2019-03-09 LAB — URINALYSIS, COMPLETE (UACMP) WITH MICROSCOPIC
Bilirubin Urine: NEGATIVE
Glucose, UA: NEGATIVE mg/dL
Hgb urine dipstick: NEGATIVE
Ketones, ur: 20 mg/dL — AB
Nitrite: NEGATIVE
Protein, ur: NEGATIVE mg/dL
Specific Gravity, Urine: 1.013 (ref 1.005–1.030)
pH: 6 (ref 5.0–8.0)

## 2019-03-09 LAB — COMPREHENSIVE METABOLIC PANEL
ALT: 10 U/L (ref 0–44)
AST: 23 U/L (ref 15–41)
Albumin: 4.7 g/dL (ref 3.5–5.0)
Alkaline Phosphatase: 49 U/L (ref 38–126)
Anion gap: 14 (ref 5–15)
BUN: 12 mg/dL (ref 8–23)
CO2: 23 mmol/L (ref 22–32)
Calcium: 9.6 mg/dL (ref 8.9–10.3)
Chloride: 94 mmol/L — ABNORMAL LOW (ref 98–111)
Creatinine, Ser: 0.65 mg/dL (ref 0.44–1.00)
GFR calc Af Amer: >60 mL/min (ref >60)
GFR calc non Af Amer: >60 mL/min (ref >60)
Glucose, Bld: 108 mg/dL — ABNORMAL HIGH (ref 70–99)
Potassium: 4.3 mmol/L (ref 3.5–5.1)
Sodium: 131 mmol/L — ABNORMAL LOW (ref 135–145)
Total Bilirubin: 0.9 mg/dL (ref 0.3–1.2)
Total Protein: 7.5 g/dL (ref 6.5–8.1)

## 2019-03-09 LAB — CBC WITH DIFFERENTIAL/PLATELET
Abs Immature Granulocytes: 0.04 10*3/uL (ref 0.00–0.07)
Basophils Absolute: 0 10*3/uL (ref 0.0–0.1)
Basophils Relative: 1 %
Eosinophils Absolute: 0.1 10*3/uL (ref 0.0–0.5)
Eosinophils Relative: 2 %
HCT: 45.2 % (ref 36.0–46.0)
Hemoglobin: 15 g/dL (ref 12.0–15.0)
Immature Granulocytes: 1 %
Lymphocytes Relative: 20 %
Lymphs Abs: 1.3 10*3/uL (ref 0.7–4.0)
MCH: 31.3 pg (ref 26.0–34.0)
MCHC: 33.2 g/dL (ref 30.0–36.0)
MCV: 94.4 fL (ref 80.0–100.0)
Monocytes Absolute: 0.6 10*3/uL (ref 0.1–1.0)
Monocytes Relative: 9 %
Neutro Abs: 4.5 10*3/uL (ref 1.7–7.7)
Neutrophils Relative %: 67 %
Platelets: 257 10*3/uL (ref 150–400)
RBC: 4.79 MIL/uL (ref 3.87–5.11)
RDW: 13.2 % (ref 11.5–15.5)
WBC: 6.7 10*3/uL (ref 4.0–10.5)
nRBC: 0 % (ref 0.0–0.2)

## 2019-03-09 MED ORDER — ONDANSETRON 4 MG PO TBDP: 4.0000 mg | ORAL_TABLET | Freq: Three times a day (TID) | ORAL | 0 refills | Status: DC | PRN

## 2019-03-09 MED ORDER — IPRATROPIUM-ALBUTEROL 0.5-2.5 (3) MG/3ML IN SOLN
3.0000 mL | Freq: Once | RESPIRATORY_TRACT | Status: AC
  Administered 2019-03-09: 3 mL via RESPIRATORY_TRACT
  Filled 2019-03-09: qty 3

## 2019-03-09 MED ORDER — SULFAMETHOXAZOLE-TRIMETHOPRIM 800-160 MG PO TABS: 1.0000 | ORAL_TABLET | Freq: Two times a day (BID) | ORAL | 0 refills | Status: DC

## 2019-03-09 MED ORDER — LORAZEPAM 2 MG/ML IJ SOLN
0.5000 mg | Freq: Once | INTRAMUSCULAR | Status: AC
  Administered 2019-03-09: 0.5 mg via INTRAVENOUS
  Filled 2019-03-09: qty 1

## 2019-03-09 MED ORDER — SODIUM CHLORIDE 0.9 % IV SOLN
Freq: Once | INTRAVENOUS | Status: AC
Start: 2019-03-09 — End: 2019-03-09
  Administered 2019-03-09: 11:00:00 via INTRAVENOUS

## 2019-03-09 MED ORDER — ONDANSETRON HCL 4 MG/2ML IJ SOLN
4.0000 mg | Freq: Once | INTRAMUSCULAR | Status: AC
  Administered 2019-03-09: 4 mg via INTRAVENOUS
  Filled 2019-03-09: qty 2

## 2019-03-09 NOTE — ED Notes (Signed)
Pt assisted to bathroom at this time.  Pt used minimal assist with steady gait.

## 2019-03-09 NOTE — ED Notes (Signed)
Waiting on ride. Remains in room until ride arrives.

## 2019-03-09 NOTE — ED Notes (Signed)
Patient transported to CT 

## 2019-03-09 NOTE — ED Triage Notes (Signed)
Pt here for dizziness and nausea since dx with OM 6 weeks ago. Has tried meclizine from pcp but no improvement.  No fevers per pt.

## 2019-03-09 NOTE — ED Provider Notes (Signed)
[facility] Emergency Department Provider Note       Time seen: ----------------------------------------- 10:47 AM on 03/09/2019 -----------------------------------------   I have reviewed the triage vital signs and the nursing notes.  HISTORY   Chief Complaint Dizziness    HPI [name] is a 73 y.o. female with a history of anxiety, chest pain, CHF, diabetes, fibromyalgia, hypertension, IBS, PTSD who presents to the ED for dizziness and nausea.  Patient has been treated for an inner ear infection for weeks now without much improvement.  Patient states she is still taking meclizine.  She also feels like she is dehydrated, she has had nausea and weight loss.  Past Medical History:  Diagnosis Date  . Anxiety   . Chest pain   . CHF (congestive heart failure) (HCC)   . Depression   . Diabetes mellitus without complication (HCC)   . Fibromyalgia   . Graves disease   . Hypertension   . IBS (irritable bowel syndrome)   . PTSD (post-traumatic stress disorder)   . Sinus problem   . Spinal stenosis     Patient Active Problem List   Diagnosis Date Noted  . Orthostatic hypotension 01/12/2017  . Bronchitis 01/04/2017  . Unstable angina (HCC) 06/13/2015  . UTI (lower urinary tract infection) 06/13/2015  . Degenerative disc disease, lumbar 10/18/2014  . Sacroiliac joint dysfunction 10/18/2014  . Facet syndrome, lumbar 10/18/2014  . Cervical facet joint syndrome 10/18/2014  . Bilateral occipital neuralgia 10/18/2014  . Migraine 10/18/2014  . Neurosis, posttraumatic 10/08/2014  . Spinal stenosis   . PTSD (post-traumatic stress disorder)     Past Surgical History:  Procedure Laterality Date  . ABDOMINAL HYSTERECTOMY    . ESOPHAGOGASTRODUODENOSCOPY (EGD) WITH PROPOFOL N/A 01/10/2015   Procedure: ESOPHAGOGASTRODUODENOSCOPY (EGD) WITH PROPOFOL;  Surgeon: Wallace Cullens, MD;  Location: [facility] ENDOSCOPY;  Service: Gastroenterology;  Laterality: N/A;  . RECTAL  SURGERY    . SAVORY DILATION N/A 01/10/2015   Procedure: SAVORY DILATION;  Surgeon: Wallace Cullens, MD;  Location: [facility] ENDOSCOPY;  Service: Gastroenterology;  Laterality: N/A;  . toenail removal Bilateral    great toes  . TONSILLECTOMY AND ADENOIDECTOMY      Allergies Celecoxib and Pregabalin  Social History Social History   Tobacco Use  . Smoking status: Former Smoker    Packs/day: 0.50    Years: 25.00    Pack years: 12.50    Types: Cigarettes    Quit date: 06/03/2016    Years since quitting: 2.7  . Smokeless tobacco: Never Used  Substance Use Topics  . Alcohol use: No    Alcohol/week: 0.0 standard drinks  . Drug use: No   Review of Systems Constitutional: Negative for fever. Cardiovascular: Negative for chest pain. Respiratory: Negative for shortness of breath. Gastrointestinal: Negative for abdominal pain, positive for nausea Musculoskeletal: Negative for back pain. Skin: Negative for rash. Neurological: Negative for headaches, focal weakness or numbness.  Positive for dizziness  All systems negative/normal/unremarkable except as stated in the HPI  ____________________________________________   PHYSICAL EXAM:  VITAL SIGNS: ED Triage Vitals  Enc Vitals Group     BP      Pulse      Resp      Temp      Temp src      SpO2      Weight      Height      Head Circumference      Peak Flow      Pain  Score      Pain Loc      Pain Edu?      Excl. in [place]?    Constitutional: Alert and oriented.  Anxious, mild distress Eyes: Conjunctivae are normal. Normal extraocular movements. ENT      Head: Normocephalic and atraumatic.      Nose: No congestion/rhinnorhea.      Mouth/Throat: Mucous membranes are moist.      Neck: No stridor. Cardiovascular: Normal rate, regular rhythm. No murmurs, rubs, or gallops. Respiratory: Normal respiratory effort without tachypnea nor retractions. Breath sounds are clear and equal bilaterally. No wheezes/rales/rhonchi. Gastrointestinal:  Soft and nontender. Normal bowel sounds Musculoskeletal: Nontender with normal range of motion in extremities. No lower extremity tenderness nor edema. Neurologic:  Normal speech and language. No gross focal neurologic deficits are appreciated.  Skin:  Skin is warm, dry and intact. No rash noted. Psychiatric: Mood and affect are normal. Speech and behavior are normal.  ____________________________________________  EKG: Interpreted by me.  Sinus rhythm with rate of 90 bpm, low voltage, long QT, normal axis  ____________________________________________  ED COURSE:  As part of my medical decision making, I reviewed the following data within the [place] History obtained from family if available, nursing notes, old chart and ekg, as well as notes from prior ED visits. Patient presented for dizziness and nausea, we will assess with labs and imaging as indicated at this time.   Procedures  [name] was evaluated in Emergency Department on 03/09/2019 for the symptoms described in the history of present illness. She was evaluated in the context of the global COVID-19 pandemic, which necessitated consideration that the patient might be at risk for infection with the SARS-CoV-2 virus that causes COVID-19. Institutional protocols and algorithms that pertain to the evaluation of patients at risk for COVID-19 are in a state of rapid change based on information released by regulatory bodies including the CDC and federal and state organizations. These policies and algorithms were followed during the patient's care in the ED.  ____________________________________________   LABS (pertinent positives/negatives)  Labs Reviewed  COMPREHENSIVE METABOLIC PANEL - Abnormal; Notable for the following components:      Result Value   Sodium 131 (*)    Chloride 94 (*)    Glucose, Bld 108 (*)    All other components within normal limits  URINALYSIS, COMPLETE (UACMP) WITH MICROSCOPIC -  Abnormal; Notable for the following components:   Color, Urine YELLOW (*)    APPearance CLOUDY (*)    Ketones, ur 20 (*)    Leukocytes,Ua MODERATE (*)    Bacteria, UA RARE (*)    All other components within normal limits  CBC WITH DIFFERENTIAL/PLATELET  CBG MONITORING, ED  TROPONIN I (HIGH SENSITIVITY)    RADIOLOGY Images were viewed by me  CT head  IMPRESSION:  Brain parenchyma appears unremarkable. No evident acute infarct. No  mass or hemorrhage.   There are foci of arterial vascular calcification. There is mild  mucosal thickening in several ethmoid air cells.  ____________________________________________   DIFFERENTIAL DIAGNOSIS   Labyrinthitis, CVA, anxiety, depression, dehydration, electrolyte abnormality  FINAL ASSESSMENT AND PLAN  Dizziness, nausea   Plan: The patient had presented for persistent dizziness with nausea and concerns for dehydration. Patient's labs did reveal possible UTI. Patient's imaging did not reveal any acute process.  Overall she was given fluids and Zofran and feels better.  She will be discharged with Zofran with a short course of antibiotics.  She  is cleared for outpatient follow-up.   Ulice DashJohnathan E Williams, MD    Note: This note was generated in part or whole with voice recognition software. Voice recognition is usually quite accurate but there are transcription errors that can and very often do occur. I apologize for any typographical errors that were not detected and corrected.     Emily FilbertWilliams, Jonathan E, MD 03/09/19 1240

## 2019-03-10 DIAGNOSIS — M4800 Spinal stenosis, site unspecified: Secondary | ICD-10-CM | POA: Diagnosis not present

## 2019-03-11 ENCOUNTER — Inpatient Hospital Stay
Admission: EM | Admit: 2019-03-11 | Discharge: 2019-03-14 | DRG: 071 | Disposition: A | Discharge status: Home Health | Payer: Medicare HMO | Attending: Internal Medicine | Admitting: Internal Medicine

## 2019-03-11 ENCOUNTER — Encounter: Payer: Self-pay | Admitting: Emergency Medicine

## 2019-03-11 ENCOUNTER — Other Ambulatory Visit: Payer: Self-pay

## 2019-03-11 ENCOUNTER — Observation Stay: Payer: Medicare HMO

## 2019-03-11 DIAGNOSIS — F3340 Major depressive disorder, recurrent, in remission, unspecified: Secondary | ICD-10-CM | POA: Diagnosis not present

## 2019-03-11 DIAGNOSIS — F4310 Post-traumatic stress disorder, unspecified: Secondary | ICD-10-CM | POA: Diagnosis present

## 2019-03-11 DIAGNOSIS — F411 Generalized anxiety disorder: Secondary | ICD-10-CM | POA: Diagnosis not present

## 2019-03-11 DIAGNOSIS — E871 Hypo-osmolality and hyponatremia: Secondary | ICD-10-CM | POA: Diagnosis present

## 2019-03-11 DIAGNOSIS — Z20828 Contact with and (suspected) exposure to other viral communicable diseases: Secondary | ICD-10-CM | POA: Diagnosis present

## 2019-03-11 DIAGNOSIS — G9340 Encephalopathy, unspecified: Secondary | ICD-10-CM | POA: Diagnosis not present

## 2019-03-11 DIAGNOSIS — I10 Essential (primary) hypertension: Secondary | ICD-10-CM | POA: Diagnosis present

## 2019-03-11 DIAGNOSIS — J441 Chronic obstructive pulmonary disease with (acute) exacerbation: Secondary | ICD-10-CM | POA: Diagnosis present

## 2019-03-11 DIAGNOSIS — R4182 Altered mental status, unspecified: Secondary | ICD-10-CM | POA: Diagnosis not present

## 2019-03-11 DIAGNOSIS — Z90710 Acquired absence of both cervix and uterus: Secondary | ICD-10-CM

## 2019-03-11 DIAGNOSIS — R8271 Bacteriuria: Secondary | ICD-10-CM | POA: Diagnosis present

## 2019-03-11 DIAGNOSIS — N390 Urinary tract infection, site not specified: Secondary | ICD-10-CM | POA: Diagnosis not present

## 2019-03-11 DIAGNOSIS — Z7984 Long term (current) use of oral hypoglycemic drugs: Secondary | ICD-10-CM

## 2019-03-11 DIAGNOSIS — I160 Hypertensive urgency: Secondary | ICD-10-CM | POA: Diagnosis not present

## 2019-03-11 DIAGNOSIS — R410 Disorientation, unspecified: Secondary | ICD-10-CM | POA: Diagnosis not present

## 2019-03-11 DIAGNOSIS — K589 Irritable bowel syndrome without diarrhea: Secondary | ICD-10-CM | POA: Diagnosis present

## 2019-03-11 DIAGNOSIS — G9349 Other encephalopathy: Secondary | ICD-10-CM | POA: Diagnosis not present

## 2019-03-11 DIAGNOSIS — Z888 Allergy status to other drugs, medicaments and biological substances status: Secondary | ICD-10-CM

## 2019-03-11 DIAGNOSIS — Z87891 Personal history of nicotine dependence: Secondary | ICD-10-CM

## 2019-03-11 DIAGNOSIS — Z79890 Hormone replacement therapy (postmenopausal): Secondary | ICD-10-CM

## 2019-03-11 DIAGNOSIS — F418 Other specified anxiety disorders: Secondary | ICD-10-CM | POA: Diagnosis present

## 2019-03-11 DIAGNOSIS — R946 Abnormal results of thyroid function studies: Secondary | ICD-10-CM | POA: Diagnosis present

## 2019-03-11 DIAGNOSIS — E039 Hypothyroidism, unspecified: Secondary | ICD-10-CM | POA: Diagnosis present

## 2019-03-11 DIAGNOSIS — R400 Somnolence: Secondary | ICD-10-CM | POA: Diagnosis not present

## 2019-03-11 DIAGNOSIS — Z7982 Long term (current) use of aspirin: Secondary | ICD-10-CM

## 2019-03-11 DIAGNOSIS — E119 Type 2 diabetes mellitus without complications: Secondary | ICD-10-CM | POA: Diagnosis present

## 2019-03-11 DIAGNOSIS — Z833 Family history of diabetes mellitus: Secondary | ICD-10-CM

## 2019-03-11 DIAGNOSIS — M797 Fibromyalgia: Secondary | ICD-10-CM | POA: Diagnosis present

## 2019-03-11 DIAGNOSIS — E860 Dehydration: Secondary | ICD-10-CM | POA: Diagnosis present

## 2019-03-11 DIAGNOSIS — G4700 Insomnia, unspecified: Secondary | ICD-10-CM | POA: Diagnosis not present

## 2019-03-11 DIAGNOSIS — Z8249 Family history of ischemic heart disease and other diseases of the circulatory system: Secondary | ICD-10-CM

## 2019-03-11 DIAGNOSIS — Z79899 Other long term (current) drug therapy: Secondary | ICD-10-CM

## 2019-03-11 DIAGNOSIS — Z23 Encounter for immunization: Secondary | ICD-10-CM

## 2019-03-11 DIAGNOSIS — Z818 Family history of other mental and behavioral disorders: Secondary | ICD-10-CM

## 2019-03-11 DIAGNOSIS — Z03818 Encounter for observation for suspected exposure to other biological agents ruled out: Secondary | ICD-10-CM | POA: Diagnosis not present

## 2019-03-11 LAB — BASIC METABOLIC PANEL
Anion gap: 17 — ABNORMAL HIGH (ref 5–15)
BUN: 11 mg/dL (ref 8–23)
CO2: 19 mmol/L — ABNORMAL LOW (ref 22–32)
Calcium: 9.5 mg/dL (ref 8.9–10.3)
Chloride: 90 mmol/L — ABNORMAL LOW (ref 98–111)
Creatinine, Ser: 0.56 mg/dL (ref 0.44–1.00)
GFR calc Af Amer: >60 mL/min (ref >60)
GFR calc non Af Amer: >60 mL/min (ref >60)
Glucose, Bld: 156 mg/dL — ABNORMAL HIGH (ref 70–99)
Potassium: 4.3 mmol/L (ref 3.5–5.1)
Sodium: 126 mmol/L — ABNORMAL LOW (ref 135–145)

## 2019-03-11 LAB — CBC
HCT: 43.8 % (ref 36.0–46.0)
Hemoglobin: 14.8 g/dL (ref 12.0–15.0)
MCH: 31.8 pg (ref 26.0–34.0)
MCHC: 33.8 g/dL (ref 30.0–36.0)
MCV: 94 fL (ref 80.0–100.0)
Platelets: 283 10*3/uL (ref 150–400)
RBC: 4.66 MIL/uL (ref 3.87–5.11)
RDW: 13.2 % (ref 11.5–15.5)
WBC: 7.7 10*3/uL (ref 4.0–10.5)
nRBC: 0 % (ref 0.0–0.2)

## 2019-03-11 LAB — GLUCOSE, CAPILLARY
Glucose-Capillary: 149 mg/dL — ABNORMAL HIGH (ref 70–99)
Glucose-Capillary: 119 mg/dL — ABNORMAL HIGH (ref 70–99)
Glucose-Capillary: 107 mg/dL — ABNORMAL HIGH (ref 70–99)

## 2019-03-11 LAB — HEMOGLOBIN A1C
Hgb A1c MFr Bld: 5.8 % — ABNORMAL HIGH (ref 4.8–5.6)
Mean Plasma Glucose: 119.76 mg/dL

## 2019-03-11 LAB — SARS CORONAVIRUS 2 (TAT 6-24 HRS): SARS Coronavirus 2: NEGATIVE

## 2019-03-11 LAB — T4, FREE: Free T4: 0.57 ng/dL — ABNORMAL LOW (ref 0.61–1.12)

## 2019-03-11 LAB — TSH: TSH: 12.472 u[IU]/mL — ABNORMAL HIGH (ref 0.350–4.500)

## 2019-03-11 MED ORDER — SODIUM CHLORIDE 0.9 % IV SOLN
INTRAVENOUS | Status: DC
  Administered 2019-03-11: 13:00:00 via INTRAVENOUS

## 2019-03-11 MED ORDER — FLUOXETINE HCL 20 MG PO CAPS
40.0000 mg | ORAL_CAPSULE | Freq: Every day | ORAL | Status: DC
  Administered 2019-03-11: 40 mg via ORAL
  Filled 2019-03-11: qty 2

## 2019-03-11 MED ORDER — ONDANSETRON HCL 4 MG PO TABS: 4.0000 mg | ORAL_TABLET | Freq: Four times a day (QID) | ORAL | Status: DC | PRN

## 2019-03-11 MED ORDER — ACETAMINOPHEN 650 MG RE SUPP: 650.0000 mg | Freq: Four times a day (QID) | RECTAL | Status: DC | PRN

## 2019-03-11 MED ORDER — ENOXAPARIN SODIUM 40 MG/0.4ML ~~LOC~~ SOLN
40.0000 mg | SUBCUTANEOUS | Status: DC
  Administered 2019-03-11 – 2019-03-14 (×3): 40 mg via SUBCUTANEOUS
  Filled 2019-03-11 (×4): qty 0.4

## 2019-03-11 MED ORDER — LUBIPROSTONE 8 MCG PO CAPS
8.0000 ug | ORAL_CAPSULE | Freq: Two times a day (BID) | ORAL | Status: DC
  Administered 2019-03-13 – 2019-03-14 (×2): 8 ug via ORAL
  Filled 2019-03-11 (×8): qty 1

## 2019-03-11 MED ORDER — IPRATROPIUM-ALBUTEROL 0.5-2.5 (3) MG/3ML IN SOLN: 3.0000 mL | Freq: Four times a day (QID) | RESPIRATORY_TRACT | Status: DC

## 2019-03-11 MED ORDER — POTASSIUM CHLORIDE CRYS ER 20 MEQ PO TBCR
10.0000 meq | EXTENDED_RELEASE_TABLET | Freq: Every day | ORAL | Status: DC
  Administered 2019-03-12 – 2019-03-14 (×3): 10 meq via ORAL
  Filled 2019-03-11 (×4): qty 1

## 2019-03-11 MED ORDER — ONDANSETRON 4 MG PO TBDP: 4.0000 mg | ORAL_TABLET | Freq: Three times a day (TID) | ORAL | Status: DC | PRN

## 2019-03-11 MED ORDER — METOCLOPRAMIDE HCL 5 MG/ML IJ SOLN
5.0000 mg | Freq: Four times a day (QID) | INTRAMUSCULAR | Status: DC | PRN
  Administered 2019-03-11 – 2019-03-12 (×2): 5 mg via INTRAVENOUS
  Filled 2019-03-11 (×2): qty 2

## 2019-03-11 MED ORDER — ACETAMINOPHEN 325 MG PO TABS
650.0000 mg | ORAL_TABLET | Freq: Four times a day (QID) | ORAL | Status: DC | PRN
  Administered 2019-03-11 – 2019-03-13 (×3): 650 mg via ORAL
  Filled 2019-03-11 (×3): qty 2

## 2019-03-11 MED ORDER — CYCLOBENZAPRINE HCL 10 MG PO TABS: 10.0000 mg | ORAL_TABLET | Freq: Two times a day (BID) | ORAL | Status: DC

## 2019-03-11 MED ORDER — LORAZEPAM 0.5 MG PO TABS
0.5000 mg | ORAL_TABLET | Freq: Four times a day (QID) | ORAL | Status: DC | PRN
  Administered 2019-03-11 – 2019-03-14 (×4): 0.5 mg via ORAL
  Filled 2019-03-11 (×4): qty 1

## 2019-03-11 MED ORDER — BUSPIRONE HCL 15 MG PO TABS
7.5000 mg | ORAL_TABLET | Freq: Two times a day (BID) | ORAL | Status: DC
  Administered 2019-03-12 – 2019-03-14 (×5): 7.5 mg via ORAL
  Filled 2019-03-11 (×8): qty 1

## 2019-03-11 MED ORDER — INSULIN ASPART 100 UNIT/ML ~~LOC~~ SOLN
0.0000 [IU] | Freq: Three times a day (TID) | SUBCUTANEOUS | Status: DC
  Administered 2019-03-11 – 2019-03-13 (×2): 1 [IU] via SUBCUTANEOUS
  Administered 2019-03-14: 2 [IU] via SUBCUTANEOUS
  Filled 2019-03-11 (×3): qty 1

## 2019-03-11 MED ORDER — ALBUTEROL SULFATE (2.5 MG/3ML) 0.083% IN NEBU: 2.5000 mg | INHALATION_SOLUTION | Freq: Four times a day (QID) | RESPIRATORY_TRACT | Status: DC | PRN

## 2019-03-11 MED ORDER — SULFAMETHOXAZOLE-TRIMETHOPRIM 800-160 MG PO TABS
1.0000 | ORAL_TABLET | Freq: Two times a day (BID) | ORAL | Status: DC
  Administered 2019-03-11 – 2019-03-13 (×4): 1 via ORAL
  Filled 2019-03-11 (×7): qty 1

## 2019-03-11 MED ORDER — MELATONIN 5 MG PO TABS
5.0000 mg | ORAL_TABLET | Freq: Every day | ORAL | Status: DC
  Administered 2019-03-11 – 2019-03-13 (×3): 5 mg via ORAL
  Filled 2019-03-11 (×4): qty 1

## 2019-03-11 MED ORDER — ASPIRIN EC 81 MG PO TBEC
81.0000 mg | DELAYED_RELEASE_TABLET | Freq: Every day | ORAL | Status: DC
  Administered 2019-03-12 – 2019-03-14 (×3): 81 mg via ORAL
  Filled 2019-03-11 (×4): qty 1

## 2019-03-11 MED ORDER — TRAZODONE HCL 50 MG PO TABS: 25.0000 mg | ORAL_TABLET | Freq: Every evening | ORAL | Status: DC | PRN

## 2019-03-11 MED ORDER — MAGNESIUM HYDROXIDE 400 MG/5ML PO SUSP
30.0000 mL | Freq: Every day | ORAL | Status: DC | PRN
  Filled 2019-03-11: qty 30

## 2019-03-11 MED ORDER — BUSPIRONE HCL 5 MG PO TABS: 7.5000 mg | ORAL_TABLET | Freq: Two times a day (BID) | ORAL | Status: DC

## 2019-03-11 MED ORDER — TIOTROPIUM BROMIDE MONOHYDRATE 18 MCG IN CAPS
18.0000 ug | ORAL_CAPSULE | Freq: Every day | RESPIRATORY_TRACT | Status: DC
  Administered 2019-03-11 – 2019-03-14 (×4): 18 ug via RESPIRATORY_TRACT
  Filled 2019-03-11: qty 5

## 2019-03-11 MED ORDER — METOPROLOL TARTRATE 25 MG PO TABS
12.5000 mg | ORAL_TABLET | Freq: Two times a day (BID) | ORAL | Status: DC
  Administered 2019-03-11 – 2019-03-13 (×6): 12.5 mg via ORAL
  Filled 2019-03-11 (×7): qty 1

## 2019-03-11 MED ORDER — LABETALOL HCL 5 MG/ML IV SOLN: 20.0000 mg | INTRAVENOUS | Status: DC | PRN

## 2019-03-11 MED ORDER — SIMVASTATIN 20 MG PO TABS
20.0000 mg | ORAL_TABLET | Freq: Every day | ORAL | Status: DC
  Administered 2019-03-12 – 2019-03-13 (×2): 20 mg via ORAL
  Filled 2019-03-11 (×2): qty 1

## 2019-03-11 MED ORDER — LORAZEPAM 0.5 MG PO TABS: 0.5000 mg | ORAL_TABLET | Freq: Every day | ORAL | Status: DC

## 2019-03-11 MED ORDER — IPRATROPIUM-ALBUTEROL 0.5-2.5 (3) MG/3ML IN SOLN
3.0000 mL | RESPIRATORY_TRACT | Status: DC | PRN
  Administered 2019-03-12: 11:00:00 3 mL via RESPIRATORY_TRACT
  Filled 2019-03-11: qty 3

## 2019-03-11 MED ORDER — CEPHALEXIN 500 MG PO CAPS
500.0000 mg | ORAL_CAPSULE | Freq: Two times a day (BID) | ORAL | Status: DC
  Administered 2019-03-11: 500 mg via ORAL
  Filled 2019-03-11 (×2): qty 1

## 2019-03-11 MED ORDER — ONDANSETRON HCL 4 MG/2ML IJ SOLN
4.0000 mg | Freq: Four times a day (QID) | INTRAMUSCULAR | Status: DC | PRN
  Administered 2019-03-11 (×2): 4 mg via INTRAVENOUS
  Filled 2019-03-11 (×2): qty 2

## 2019-03-11 MED ORDER — LEVOTHYROXINE SODIUM 88 MCG PO TABS
88.0000 ug | ORAL_TABLET | Freq: Every day | ORAL | Status: DC
  Administered 2019-03-11 – 2019-03-13 (×3): 88 ug via ORAL
  Filled 2019-03-11 (×5): qty 1

## 2019-03-11 MED ORDER — FLUOXETINE HCL 20 MG PO CAPS
20.0000 mg | ORAL_CAPSULE | Freq: Every day | ORAL | Status: DC
  Administered 2019-03-11 – 2019-03-12 (×2): 20 mg via ORAL
  Filled 2019-03-11 (×3): qty 1

## 2019-03-11 MED ORDER — QUETIAPINE FUMARATE 25 MG PO TABS
25.0000 mg | ORAL_TABLET | Freq: Every day | ORAL | Status: DC
  Administered 2019-03-12: 22:00:00 25 mg via ORAL
  Filled 2019-03-11 (×2): qty 1

## 2019-03-11 NOTE — ED Notes (Signed)
Report to jane and kelly, rn.  

## 2019-03-11 NOTE — ED Notes (Addendum)
Pt given phone to talk to pastor.

## 2019-03-11 NOTE — ED Notes (Signed)
Pt repeatedly hitting call bell. When this RN responds pt stated she couldn't see the clock and didn't believe that it was 0752. Pt alert and oriented to self and place. Pt in view from nurses station. Pt very restless in bed and constantly leaning over and holding her leg up. Pt given warm blanket and tv turned on. Will continue to monitor.

## 2019-03-11 NOTE — ED Notes (Signed)
This RN introduced self to pt. Pt appears anxious and figidity. Pt states she feels like she needs oxygen at home. Pt states she wants CPAP because her sister has CPAP and said it works really well. Pt states she has been anxious since she found out she needed oxygen.

## 2019-03-11 NOTE — ED Notes (Signed)
Pt ringing call bell approx every 2-5 minutes to discuss which room she was in yesterday.

## 2019-03-11 NOTE — ED Notes (Signed)
Assessment: pt is currently alert and oriented x4, but states she feels confused. Pt states she may have taken too much prozac or xanax, she is not sure. Pt with rapid speech, appears anxious. Pt is having difficulty staying on topic during conversation. Pt states she believes she has something wrong with her ears but she cannot elaborate on what is wrong with ears. No skin tenting noted. Pt states she has had nausea, no vomiting or diarrhea. Pt states "I should have been tested for covid", but then denies symptoms other than nausea.

## 2019-03-11 NOTE — Consult Note (Addendum)
[facility] Face-to-Face Psychiatry Consult   Reason for Consult:  Confusion  Referring Physician:  Dr Darvin Neighbours Patient Identification: [name] MRN:  [id number] Principal Diagnosis: AMS related to underlying medical issues Diagnosis:  Active Problems:   Hypothyroidism   Altered mental status, unspecified   Acute encephalopathy  Total Time spent with patient: 1 hour  Subjective:   [name] is a 73 y.o. female patient admitted with AMS.  "I'm still confused."  When asked about hallucinations, she states "I do not have that."  Patient seen and evaluated in person by this provider.  She was admitted for confusion and found to have a UTI and elevated TSH.  She realizes she is confused and is reorienting herself during the assessment.  She is aware of her caregivers and being in quarantine since February.  Reports the correct psychiatric medications along with her outpatient provider, [facility].  Denies suicidal/homicidal ideations, hallucinations, and substance abuse.  HPI on admission per MD:  [name]  is a 73 y.o. Caucasian female with a known history of multiple medical problems that will be mentioned below, including CHF, depression, type diabetes mellitus, fibromyalgia, Graves' disease, hypertension and posttraumatic stress disorder as well as IBS, who presented to the emergency room with acute onset of altered mental status with confusion.  She apparently called her minister last night and was rambling over the phone and acting strange and confused.  They called EMS.  She has been taking meclizine for dizziness but she was prescribed yesterday when she was seen in the ER but is not sure how much.  She has been taking Prozac as well as lorazepam.  She was fairly confused in the ER and has been experiencing flight of ideas jumping from topic to topic during discussion with the ER physician as well as during my discussion.  She lives alone.  She admitted to mild left upper quadrant abdominal pain with  nausea.  She states she vomited last night.  She has been having chest pressure without radiation as well as wheezing without cough or dyspnea.  She denies any fever or chills.  No paresthesias or focal muscle weakness.  Upon presentation to the emergency room, her blood pressure was elevated 195/100 with otherwise normal vital signs.  Her UA a couple of days ago showed 11-20 WBCs with 20 ketones for which she was given Bactrim DS.  Her BMP showed hyponatremia 126 with hypochloremia of 90 anion gap was 17 CBC was within normal.  TSH was elevated at 12.4.  She had a head CT scan without contrast on 10/22 that showed no acute intracranial normalities.  EKG then showed normal sinus rhythm with a rate of 90  Past Psychiatric History: depression, anxiety  Risk to Self:  none Risk to Others:  none Prior Inpatient Therapy:  none Prior Outpatient Therapy:  [facility]  Past Medical History:  Past Medical History:  Diagnosis Date  . Anxiety   . Chest pain   . CHF (congestive heart failure) ([place])   . Depression   . Diabetes mellitus without complication ([place])   . Fibromyalgia   . Graves disease   . Hypertension   . IBS (irritable bowel syndrome)   . PTSD (post-traumatic stress disorder)   . Sinus problem   . Spinal stenosis     Past Surgical History:  Procedure Laterality Date  . ABDOMINAL HYSTERECTOMY    . ESOPHAGOGASTRODUODENOSCOPY (EGD) WITH PROPOFOL N/A 01/10/2015   Procedure: ESOPHAGOGASTRODUODENOSCOPY (EGD) WITH PROPOFOL;  Surgeon: Lupita Dawn  Bluford Kaufmann, MD;  Location: [facility] ENDOSCOPY;  Service: Gastroenterology;  Laterality: N/A;  . RECTAL SURGERY    . SAVORY DILATION N/A 01/10/2015   Procedure: SAVORY DILATION;  Surgeon: Wallace Cullens, MD;  Location: [facility] ENDOSCOPY;  Service: Gastroenterology;  Laterality: N/A;  . toenail removal Bilateral    great toes  . TONSILLECTOMY AND ADENOIDECTOMY     Family History:  Family History  Problem Relation Age of Onset  . Alcohol abuse Mother   . Depression Mother    . Varicose Veins Mother   . Alcohol abuse Father   . Depression Father   . Alcohol abuse Sister   . Asthma Sister   . COPD Sister   . Depression Sister   . Heart disease Sister   . Hypertension Sister   . Depression Maternal Grandmother   . Diabetes Paternal Grandmother   . Stroke Paternal Grandmother    Family Psychiatric  History: see above Social History:  Social History   Substance and Sexual Activity  Alcohol Use No  . Alcohol/week: 0.0 standard drinks     Social History   Substance and Sexual Activity  Drug Use No    Social History   Socioeconomic History  . Marital status: Widowed    Spouse name: Not on file  . Number of children: Not on file  . Years of education: Not on file  . Highest education level: Not on file  Occupational History  . Not on file  Social Needs  . Financial resource strain: Not on file  . Food insecurity    Worry: Not on file    Inability: Not on file  . Transportation needs    Medical: Not on file    Non-medical: Not on file  Tobacco Use  . Smoking status: Former Smoker    Packs/day: 0.50    Years: 25.00    Pack years: 12.50    Types: Cigarettes    Quit date: 06/03/2016    Years since quitting: 2.7  . Smokeless tobacco: Never Used  Substance and Sexual Activity  . Alcohol use: No    Alcohol/week: 0.0 standard drinks  . Drug use: No  . Sexual activity: Never  Lifestyle  . Physical activity    Days per week: Not on file    Minutes per session: Not on file  . Stress: Not on file  Relationships  . Social Musician on phone: Not on file    Gets together: Not on file    Attends religious service: Not on file    Active member of club or organization: Not on file    Attends meetings of clubs or organizations: Not on file    Relationship status: Not on file  Other Topics Concern  . Not on file  Social History Narrative  . Not on file   Additional Social History:    Allergies:   Allergies  Allergen  Reactions  . Celecoxib     Other reaction(s): Other (qualifier value) Patient is allergic to [facility] and found that it caused heart failure. CHF  . Pregabalin     Other reaction(s): Localized superficial swelling of skin    Labs:  Results for orders placed or performed during the hospital encounter of 03/11/19 (from the past 48 hour(s))  T4, free     Status: Abnormal   Collection Time: 03/11/19  6:09 AM  Result Value Ref Range   Free T4 0.57 (L) 0.61 - 1.12 ng/dL  Comment: (NOTE) Biotin ingestion may interfere with free T4 tests. If the results are inconsistent with the TSH level, previous test results, or the clinical presentation, then consider biotin interference. If needed, order repeat testing after stopping biotin. Performed at [facility], [address] [place], [place] [address]   TSH     Status: Abnormal   Collection Time: 03/11/19  6:09 AM  Result Value Ref Range   TSH 12.472 (H) 0.350 - 4.500 uIU/mL    Comment: Performed by a 3rd Generation assay with a functional sensitivity of <=0.01 uIU/mL. Performed at [facility], [address] [place], [place] [address]   CBC     Status: None   Collection Time: 03/11/19  6:09 AM  Result Value Ref Range   WBC 7.7 4.0 - 10.5 K/uL   RBC 4.66 3.87 - 5.11 MIL/uL   Hemoglobin 14.8 12.0 - 15.0 g/dL   HCT 09.843.8 11.936.0 - 14.746.0 %   MCV 94.0 80.0 - 100.0 fL   MCH 31.8 26.0 - 34.0 pg   MCHC 33.8 30.0 - 36.0 g/dL   RDW 82.913.2 56.211.5 - 13.015.5 %   Platelets 283 150 - 400 K/uL   nRBC 0.0 0.0 - 0.2 %    Comment: Performed at [facility], [address] [place], [place] [address]  Basic metabolic panel     Status: Abnormal   Collection Time: 03/11/19  6:09 AM  Result Value Ref Range   Sodium 126 (L) 135 - 145 mmol/L   Potassium 4.3 3.5 - 5.1 mmol/L   Chloride 90 (L) 98 - 111 mmol/L   CO2 19 (L) 22 - 32 mmol/L   Glucose, Bld 156 (H) 70 - 99 mg/dL   BUN 11 8 - 23 mg/dL   Creatinine, Ser 4.690.56 0.44 - 1.00  mg/dL   Calcium 9.5 8.9 - 62.910.3 mg/dL   GFR calc non Af Amer >60 >60 mL/min   GFR calc Af Amer >60 >60 mL/min   Anion gap 17 (H) 5 - 15    Comment: Performed at [facility], [address] [place], [place] [address]    Current Facility-Administered Medications  Medication Dose Route Frequency Provider Last Rate Last Dose  . 0.9 %  sodium chloride infusion   Intravenous Continuous Mansy, Jan A, MD      . acetaminophen (TYLENOL) tablet 650 mg  650 mg Oral Q6H PRN Mansy, Jan A, MD       Or  . acetaminophen (TYLENOL) suppository 650 mg  650 mg Rectal Q6H PRN Mansy, Jan A, MD      . albuterol (PROVENTIL) (2.5 MG/3ML) 0.083% nebulizer solution 2.5 mg  2.5 mg Inhalation Q6H PRN Mansy, Jan A, MD      . aspirin EC tablet 81 mg  81 mg Oral Daily Mansy, Jan A, MD   81 mg at 03/11/19 [id number]  . busPIRone (BUSPAR) tablet 7.5 mg  7.5 mg Oral BID Charm RingsLord, Jamison Y, NP      . cephALEXin [facility](KEFLEX) capsule 500 mg  500 mg Oral Q12H Minna AntisPaduchowski, Kevin, MD   500 mg at 03/11/19 [id number]  . enoxaparin (LOVENOX) injection 40 mg  40 mg Subcutaneous Q24H Mansy, Jan A, MD   40 mg at 03/11/19 0925  . FLUoxetine (PROZAC) capsule 20 mg  20 mg Oral Daily Lord, Jamison Y, NP      . insulin aspart (novoLOG) injection 0-9 Units  0-9 Units Subcutaneous TID WC Mansy, Vernetta HoneyJan A, MD      .  ipratropium-albuterol (DUONEB) 0.5-2.5 (3) MG/3ML nebulizer solution 3 mL  3 mL Nebulization QID Mansy, Jan A, MD      . labetalol (NORMODYNE) injection 20 mg  20 mg Intravenous Q3H PRN Mansy, Jan A, MD      . labetalol (NORMODYNE) injection 20 mg  20 mg Intravenous Q3H PRN Mansy, Jan A, MD      . levothyroxine (SYNTHROID) tablet 88 mcg  88 mcg Oral QAC breakfast Mansy, Jan A, MD      . LORazepam (ATIVAN) tablet 0.5 mg  0.5 mg Oral Q6H PRN Sudini, Wardell Heath, MD      . lubiprostone (AMITIZA) capsule 8 mcg  8 mcg Oral BID WC Mansy, Jan A, MD   8 mcg at 03/11/19 9147  . magnesium hydroxide (MILK OF MAGNESIA) suspension 30 mL  30 mL Oral Daily PRN  Mansy, Jan A, MD      . Melatonin TABS 5 mg  5 mg Oral QHS Mansy, Jan A, MD      . metoprolol tartrate (LOPRESSOR) tablet 12.5 mg  12.5 mg Oral BID Mansy, Jan A, MD   12.5 mg at 03/11/19 8295  . ondansetron (ZOFRAN) tablet 4 mg  4 mg Oral Q6H PRN Mansy, Jan A, MD       Or  . ondansetron [facility]) injection 4 mg  4 mg Intravenous Q6H PRN Mansy, Jan A, MD   4 mg at 03/11/19 0935  . potassium chloride SA (KLOR-CON) CR tablet 10 mEq  10 mEq Oral Daily Mansy, Jan A, MD   10 mEq at 03/11/19 6213  . QUEtiapine (SEROQUEL) tablet 25 mg  25 mg Oral QHS Charm Rings, NP      . simvastatin (ZOCOR) tablet 20 mg  20 mg Oral q1800 Mansy, Jan A, MD      . sulfamethoxazole-trimethoprim (BACTRIM DS) 800-160 MG per tablet 1 tablet  1 tablet Oral BID Mansy, Vernetta Honey, MD   1 tablet at 03/11/19 0865  . tiotropium (SPIRIVA) inhalation capsule ([facility] use ONLY) 18 mcg  18 mcg Inhalation Daily Mansy, Jan A, MD   18 mcg at 03/11/19 7846  . traZODone (DESYREL) tablet 25 mg  25 mg Oral QHS PRN Mansy, Vernetta Honey, MD        Musculoskeletal: Strength & Muscle Tone: within normal limits Gait & Station: unsteady Patient leans: N/A  Psychiatric Specialty Exam: Physical Exam  Nursing note and vitals reviewed. Constitutional: She appears well-developed and well-nourished.  HENT:  Head: Normocephalic.  Neck: Normal range of motion.  Respiratory: Effort normal.  Musculoskeletal: Normal range of motion.  Neurological: She is alert.  Psychiatric: Her speech is normal and behavior is normal. Judgment and thought content normal. Her mood appears anxious. Cognition and memory are impaired.    Review of Systems  Psychiatric/Behavioral: Positive for memory loss. The patient is nervous/anxious and has insomnia.   All other systems reviewed and are negative.   Blood pressure (!) 165/108, pulse 88, temperature 98.4 F (36.9 C), temperature source Oral, resp. rate 18, height  (1.676 m), weight 50.8 kg, SpO2 97 %.Body mass index is  18.08 kg/m.  General Appearance: Casual  Eye Contact:  Good  Speech:  Slow with some thought blocking at times  Volume:  Normal  Mood:  Anxious  Affect:  Congruent  Thought Process:  Coherent and Descriptions of Associations: Intact  Orientation:  Other:  person and place  Thought Content:  Logical  Suicidal Thoughts:  No  Homicidal Thoughts:  No  Memory:  Immediate;   Poor Recent;   Poor Remote;   Fair  Judgement:  Impaired  Insight:  Fair  Psychomotor Activity:  Decreased  Concentration:  Concentration: Fair and Attention Span: Fair  Recall:  [employer] of Knowledge:  Good  Language:  Good  Akathisia:  No  Handed:  Right  AIMS (if indicated):     Assets:  Housing Resilience  ADL's:  Intact  Cognition:  Impaired,  Mild  Sleep:      73 year old female admitted for altered mental status/confusion.  Her TSH is 12.4 with cognitive decline associated with her levels.  She realizes that she is confused with some thought blocking.  Patient is able to identify her home aids and be in an isolation since February.  Also able to report her psychiatric medications and her outpatient provider, [facility].  No psychosis noted on assessment.  Denies suicidal/homicidal ideations and substance abuse.  Her current symptoms appear related to her medical issues of elevated TSH and UTI.  She does report poor sleep and appetite, medication recommended below.  Treatment Plan Summary: Major depressive disorder, recurrent, remission: -Continue Prozac 20 mg daily  Anxiety: -Continue BuSpar 7.5 mg twice daily  Insomnia: -Start Seroquel 25 mg at bedtime  Disposition: No evidence of imminent risk to self or others at present.   Patient does not meet criteria for psychiatric inpatient admission.  Nanine Means, NP 03/11/2019 9:48 AM  Case discussed and plan agreed upon as outlined above.

## 2019-03-11 NOTE — ED Notes (Signed)
hospitalist in to see pt.

## 2019-03-11 NOTE — ED Provider Notes (Signed)
[facility] Emergency Department Provider Note  Time seen: 6:33 AM  I have reviewed the triage vital signs and the nursing notes.   HISTORY  Chief Complaint Drug Overdose   HPI [name] is a 73 y.o. female with a past medical history of anxiety, CHF, depression, diabetes, fibromyalgia, Graves' disease, hypertension, PTSD, presents to the emergency department for increased/continued confusion.  Per EMS report patient has minister and had called EMS for the patient because she was acting strange and confused.  Patient was seen in the emergency department yesterday for dizziness.  Patient has been taking meclizine although she is not sure how much, states she does not know if she is taking too much or not enough of her medications including Prozac.  Is not sure if she is taking lorazepam.  Patient is quite confused in the emergency department she is able to give me the date however she is experiencing more of a flight of ideas jumping from topic to topic during our discussion.  Patient states she lives alone.  In the patient's current states she is not safe to care for herself.  Past Medical History:  Diagnosis Date  . Anxiety   . Chest pain   . CHF (congestive heart failure) ([place])   . Depression   . Diabetes mellitus without complication ([place])   . Fibromyalgia   . Graves disease   . Hypertension   . IBS (irritable bowel syndrome)   . PTSD (post-traumatic stress disorder)   . Sinus problem   . Spinal stenosis     Patient Active Problem List   Diagnosis Date Noted  . Orthostatic hypotension 01/12/2017  . Bronchitis 01/04/2017  . Unstable angina ([place]) 06/13/2015  . UTI (lower urinary tract infection) 06/13/2015  . Degenerative disc disease, lumbar 10/18/2014  . Sacroiliac joint dysfunction 10/18/2014  . Facet syndrome, lumbar 10/18/2014  . Cervical facet joint syndrome 10/18/2014  . Bilateral occipital neuralgia 10/18/2014  . Migraine 10/18/2014  .  Neurosis, posttraumatic 10/08/2014  . Spinal stenosis   . PTSD (post-traumatic stress disorder)     Past Surgical History:  Procedure Laterality Date  . ABDOMINAL HYSTERECTOMY    . ESOPHAGOGASTRODUODENOSCOPY (EGD) WITH PROPOFOL N/A 01/10/2015   Procedure: ESOPHAGOGASTRODUODENOSCOPY (EGD) WITH PROPOFOL;  Surgeon: Hulen Luster, MD;  Location: [facility] ENDOSCOPY;  Service: Gastroenterology;  Laterality: N/A;  . RECTAL SURGERY    . SAVORY DILATION N/A 01/10/2015   Procedure: SAVORY DILATION;  Surgeon: Hulen Luster, MD;  Location: [facility] ENDOSCOPY;  Service: Gastroenterology;  Laterality: N/A;  . toenail removal Bilateral    great toes  . TONSILLECTOMY AND ADENOIDECTOMY      Prior to Admission medications   Medication Sig Start Date End Date Taking? Authorizing Provider  albuterol (PROVENTIL HFA;VENTOLIN HFA) 108 (90 Base) MCG/ACT inhaler Inhale 2 puffs into the lungs every 6 (six) hours as needed for wheezing or shortness of breath. 01/04/17   Epifanio Lesches, MD  aspirin 81 MG tablet Take 81 mg by mouth daily.    [provider]  busPIRone (BUSPAR) 7.5 MG tablet Take 7.5 mg by mouth 2 (two) times daily.    [provider]  cyclobenzaprine (FLEXERIL) 10 MG tablet Take 10 mg by mouth 2 (two) times daily.    [provider]  FLUoxetine (PROZAC) 20 MG capsule Take 40 mg by mouth daily.    [provider]  hydrocortisone (ANUSOL-HC) 2.5 % rectal cream Place 1 application rectally 3 (three) times daily as needed  for hemorrhoids or itching. Reported on 12/02/2015    [provider]  levothyroxine (SYNTHROID, LEVOTHROID) 88 MCG tablet Take 88 mcg by mouth daily before breakfast.     [provider]  loperamide (IMODIUM) 2 MG capsule Take by mouth as needed for diarrhea or loose stools. prn    [provider]  LORazepam (ATIVAN) 0.5 MG tablet Take 0.5 mg by mouth at bedtime.    [provider]  lubiprostone (AMITIZA) 8 MCG capsule Take 8  mcg by mouth 2 (two) times daily with a meal.    [provider]  Melatonin 3 MG TABS Take 3 mg by mouth at bedtime.    [provider]  metFORMIN (GLUCOPHAGE) 500 MG tablet Take 500 mg by mouth daily with breakfast.     [provider]  metoprolol tartrate (LOPRESSOR) 25 MG tablet Take 0.5 tablets (12.5 mg total) by mouth 2 (two) times daily. 01/13/17   Milagros Loll, MD  ondansetron (ZOFRAN ODT) 4 MG disintegrating tablet Take 1 tablet (4 mg total) by mouth every 8 (eight) hours as needed for nausea or vomiting. 03/09/19   Emily Filbert, MD  Potassium 99 MG TABS Take 1 tablet by mouth daily.    [provider]  simvastatin (ZOCOR) 20 MG tablet Take 20 mg by mouth daily at 6 PM.    [provider]  sulfamethoxazole-trimethoprim (BACTRIM DS) 800-160 MG tablet Take 1 tablet by mouth 2 (two) times daily. 03/09/19   Emily Filbert, MD  tiotropium (SPIRIVA) 18 MCG inhalation capsule Place 18 mcg into inhaler and inhale daily. Reported on 08/07/2015    [provider]    Allergies  Allergen Reactions  . Celecoxib     Other reaction(s): Other (qualifier value) Patient is allergic to [facility] and found that it caused heart failure. CHF  . Pregabalin     Other reaction(s): Localized superficial swelling of skin    Family History  Problem Relation Age of Onset  . Alcohol abuse Mother   . Depression Mother   . Varicose Veins Mother   . Alcohol abuse Father   . Depression Father   . Alcohol abuse Sister   . Asthma Sister   . COPD Sister   . Depression Sister   . Heart disease Sister   . Hypertension Sister   . Depression Maternal Grandmother   . Diabetes Paternal Grandmother   . Stroke Paternal Grandmother     Social History Social History   Tobacco Use  . Smoking status: Former Smoker    Packs/day: 0.50    Years: 25.00    Pack years: 12.50    Types: Cigarettes    Quit date: 06/03/2016    Years since quitting: 2.7   . Smokeless tobacco: Never Used  Substance Use Topics  . Alcohol use: No    Alcohol/week: 0.0 standard drinks  . Drug use: No    Review of Systems Unable to obtain an adequate/accurate review of systems secondary to altered mental status/confusion.  ____________________________________________   PHYSICAL EXAM:  VITAL SIGNS: ED Triage Vitals  Enc Vitals Group     BP 03/11/19 0608 (!) 195/100     Pulse Rate 03/11/19 0608 91     Resp 03/11/19 0608 18     Temp 03/11/19 0608 97.7 F (36.5 C)     Temp Source 03/11/19 0608 Oral     SpO2 03/11/19 0608 96 %     Weight --      Height  03/11/19 0557 5\' 5"  (1.651 m)     Head Circumference --      Peak Flow --      Pain Score 03/11/19 0557 0     Pain Loc --      Pain Edu? --      Excl. in GC? --    Constitutional: Alert and oriented. Well appearing and in no distress. Eyes: Normal exam ENT      Head: Normocephalic and atraumatic.      Mouth/Throat: Mucous membranes are moist. Cardiovascular: Normal rate, regular rhythm. No murmur Respiratory: Normal respiratory effort without tachypnea nor retractions. Breath sounds are clear  Gastrointestinal: Soft and nontender. No distention. Musculoskeletal: Nontender with normal range of motion in all extremities. Neurologic:  Normal speech and language. No gross focal neurologic deficits  Skin:  Skin is warm, dry and intact.  Psychiatric: Mood and affect are normal.   ____________________________________________   INITIAL IMPRESSION / ASSESSMENT AND PLAN / ED COURSE  Pertinent labs & imaging results that were available during my care of the patient were reviewed by me and considered in my medical decision making (see chart for details).   Patient presents to the emergency department for confusion/altered mental status.  Patient's pupils are quite dilated, she is jumping from topic to topic, does not appear to be able to concentrate for longer give sufficient answers to my questions  before losing focus.  Patient states she lives alone.  Patient states she tried to counter medications but she is not sure if she took too much of her medicine or if she did not take enough of her medication.  Patient was prescribed meclizine.  Her symptoms could be consistent with anticholinergic toxicity with dilated pupils dry mouth and confusion.  She also think she could have taken too much Prozac back sent, states she does not think she has taken any lorazepam in several days.  Patient is on multiple medications that could be contributing to her symptoms, her symptoms could also be due to anxiety alone.  Patient was diagnosed with a mild urinary tract infection yesterday and started on antibiotic as well as she is not sure if she has started this antibiotic.  Patient's work-up yesterday was largely within normal limits besides a mild urinary tract infection including CT imaging.  Marja KaysBetty C Checo was evaluated in Emergency Department on 03/11/2019 for the symptoms described in the history of present illness. She was evaluated in the context of the global COVID-19 pandemic, which necessitated consideration that the patient might be at risk for infection with the SARS-CoV-2 virus that causes COVID-19. Institutional protocols and algorithms that pertain to the evaluation of patients at risk for COVID-19 are in a state of rapid change based on information released by regulatory bodies including the CDC and federal and state organizations. These policies and algorithms were followed during the patient's care in the ED.  ____________________________________________   FINAL CLINICAL IMPRESSION(S) / ED DIAGNOSES  Confusion Altered mental status   Minna AntisPaduchowski, Khushbu Pippen, MD 03/11/19 [phone]

## 2019-03-11 NOTE — ED Notes (Signed)
MD in to see pt.

## 2019-03-11 NOTE — H&P (Signed)
[facility] [place] [facility]   PATIENT NAME: [name]    MR#:  [id number]  DATE OF BIRTH:  [birth year]  DATE OF ADMISSION:  03/11/2019  PRIMARY CARE PHYSICIAN: Sherron Monday, MD   REQUESTING/REFERRING PHYSICIAN: Minna Antis, MD  CHIEF COMPLAINT:   Chief Complaint  Patient presents with  . Drug Overdose    HISTORY OF PRESENT ILLNESS:  [name]  is a 73 y.o. Caucasian female with a known history of multiple medical problems that will be mentioned below, including CHF, depression, type diabetes mellitus, fibromyalgia, Graves' disease, hypertension and posttraumatic stress disorder as well as IBS, who presented to the emergency room with acute onset of altered mental status with confusion.  She apparently called her minister last night and was rambling over the phone and acting strange and confused.  They called EMS.  She has been taking meclizine for dizziness but she was prescribed yesterday when she was seen in the ER but is not sure how much.  She has been taking Prozac as well as lorazepam.  She was fairly confused in the ER and has been experiencing flight of ideas jumping from topic to topic during discussion with the ER physician as well as during my discussion.  She lives alone.  She admitted to mild left upper quadrant abdominal pain with nausea.  She states she vomited last night.  She has been having chest pressure without radiation as well as wheezing without cough or dyspnea.  She denies any fever or chills.  No paresthesias or focal muscle weakness.  Upon presentation to the emergency room, her blood pressure was elevated 195/100 with otherwise normal vital signs.  Her UA a couple of days ago showed 11-20 WBCs with 20 ketones for which she was given Bactrim DS.  Her BMP showed hyponatremia 126 with hypochloremia of 90 anion gap was 17 CBC was within normal.  TSH was elevated at 12.4.  She had a head CT scan without contrast on 10/22 that  showed no acute intracranial normalities.  EKG then showed normal sinus rhythm with a rate of 90.  She will be admitted to an observation medical monitored bed for further evaluation and management. PAST MEDICAL HISTORY:   Past Medical History:  Diagnosis Date  . Anxiety   . Chest pain   . CHF (congestive heart failure) ([facility])   . Depression   . Diabetes mellitus without complication ([facility])   . Fibromyalgia   . Graves disease   . Hypertension   . IBS (irritable bowel syndrome)   . PTSD (post-traumatic stress disorder)   . Sinus problem   . Spinal stenosis     PAST SURGICAL HISTORY:   Past Surgical History:  Procedure Laterality Date  . ABDOMINAL HYSTERECTOMY    . ESOPHAGOGASTRODUODENOSCOPY (EGD) WITH PROPOFOL N/A 01/10/2015   Procedure: ESOPHAGOGASTRODUODENOSCOPY (EGD) WITH PROPOFOL;  Surgeon: Wallace Cullens, MD;  Location: [facility] ENDOSCOPY;  Service: Gastroenterology;  Laterality: N/A;  . RECTAL SURGERY    . SAVORY DILATION N/A 01/10/2015   Procedure: SAVORY DILATION;  Surgeon: Wallace Cullens, MD;  Location[facility] END[facility]COPY;  Service: Gastroenterology;  Laterality: N/A;  . toenail removal Bilateral    great toes  . TONSILLECTOMY AND ADENOIDECTOMY      SOCIAL HISTORY:   Social History   Tobacco Use  . Smoking status: Former Smoker    Packs/day: 0.50    Years: 25.00    Pack years: 12.50    Types: Cigarettes  Quit date: 06/03/2016    Years since quitting: 2.7  . Smokeless tobacco: Never Used  Substance Use Topics  . Alcohol use: No    Alcohol/week: 0.0 standard drinks    FAMILY HISTORY:   Family History  Problem Relation Age of Onset  . Alcohol abuse Mother   . Depression Mother   . Varicose Veins Mother   . Alcohol abuse Father   . Depression Father   . Alcohol abuse Sister   . Asthma Sister   . COPD Sister   . Depression Sister   . Heart disease Sister   . Hypertension Sister   . Depression Maternal Grandmother   . Diabetes Paternal Grandmother   . Stroke  Paternal Grandmother     DRUG ALLERGIES:   Allergies  Allergen Reactions  . Celecoxib     Other reaction(s): Other (qualifier value) [facility] and found that it caused heart failure. CHF  . Pregabalin     Other reaction(s): Localized superficial swelling of skin    REVIEW OF SYSTEMS:   ROS As per history of present illness. All pertinent systems were reviewed above. Constitutional,  HEENT, cardiovascular, respiratory, GI, GU, musculoskeletal, neuro, psychiatric, endocrine,  integumentary and hematologic systems were reviewed and are otherwise  negative/unremarkable except for positive findings mentioned above in the HPI.   MEDICATIONS AT HOME:   Prior to Admission medications   Medication Sig Start Date End Date Taking? Authorizing Provider  albuterol (PROVENTIL HFA;VENTOLIN HFA) 108 (90 Base) MCG/ACT inhaler Inhale 2 puffs into the lungs every 6 (six) hours as needed for wheezing or shortness of breath. 01/04/17   Katha HammingKonidena, Snehalatha, MD  aspirin 81 MG tablet Take 81 mg by mouth daily.    [provider]  busPIRone (BUSPAR) 7.5 MG tablet Take 7.5 mg by mouth 2 (two) times daily.    [provider]  cyclobenzaprine (FLEXERIL) 10 MG tablet Take 10 mg by mouth 2 (two) times daily.    [provider]  FLUoxetine (PROZAC) 20 MG capsule Take 40 mg by mouth daily.    [provider]  hydrocortisone (ANUSOL-HC) 2.5 % rectal cream Place 1 application rectally 3 (three) times daily as needed for hemorrhoids or itching. Reported on 12/02/2015    [provider]  levothyroxine (SYNTHROID, LEVOTHROID) 88 MCG tablet Take 88 mcg by mouth daily before breakfast.     [provider]  loperamide (IMODIUM) 2 MG capsule Take by mouth as needed for diarrhea or loose stools. prn    [provider]  LORazepam (ATIVAN) 0.5 MG tablet Take 0.5 mg by mouth at bedtime.    [provider]  lubiprostone (AMITIZA) 8  MCG capsule Take 8 mcg by mouth 2 (two) times daily with a meal.    [provider]  Melatonin 3 MG TABS Take 3 mg by mouth at bedtime.    [provider]  metFORMIN (GLUCOPHAGE) 500 MG tablet Take 500 mg by mouth daily with breakfast.     [provider]  metoprolol tartrate (LOPRESSOR) 25 MG tablet Take 0.5 tablets (12.5 mg total) by mouth 2 (two) times daily. 01/13/17   Milagros LollSudini, Srikar, MD  ondansetron (ZOFRAN ODT) 4 MG disintegrating tablet Take 1 tablet (4 mg total) by mouth every 8 (eight) hours as needed for nausea or vomiting. 03/09/19   Emily FilbertWilliams, Jonathan E, MD  Potassium 99 MG TABS Take 1 tablet by mouth daily.    [provider]  simvastatin (ZOCOR) 20 MG  tablet Take 20 mg by mouth daily at 6 PM.    [provider]  sulfamethoxazole-trimethoprim (BACTRIM DS) 800-160 MG tablet Take 1 tablet by mouth 2 (two) times daily. 03/09/19   Earleen Newport, MD  tiotropium (SPIRIVA) 18 MCG inhalation capsule Place 18 mcg into inhaler and inhale daily. Reported on 08/07/2015    [provider]      VITAL SIGNS:  Blood pressure (!) 195/100, pulse 91, temperature 97.7 F (36.5 C), temperature source Oral, resp. rate 18, height 5\' 5"  (1.651 m), SpO2 96 %.  PHYSICAL EXAMINATION:  Physical Exam  GENERAL:  73 y.o.-year-old Caucasian female patient lying in the bed anxious and restless with no acute distress.  EYES: Pupils equal, round, reactive to light and accommodation. No scleral icterus. Extraocular muscles intact.  HEENT: Head atraumatic, normocephalic. Oropharynx and nasopharynx clear.  NECK:  Supple, no jugular venous distention. No thyroid enlargement, no tenderness.  LUNGS: Normal breath sounds bilaterally, no wheezing, rales,rhonchi or crepitation. No use of accessory muscles of respiration.  CARDIOVASCULAR: Regular rate and rhythm, S1, S2 normal. No murmurs, rubs, or gallops.  ABDOMEN: Soft, nondistended, nontender. Bowel sounds  present. No organomegaly or mass.  EXTREMITIES: No pedal edema, cyanosis, or clubbing.  NEUROLOGIC: Cranial nerves II through XII are intact. Muscle strength 5/5 in all extremities. Sensation intact. Gait not checked.  She is globally confused. PSYCHIATRIC: She is fairly confused with tangential thoughts. SKIN: No obvious rash, lesion, or ulcer.   LABORATORY PANEL:   CBC Recent Labs  Lab 03/11/19 0609  WBC 7.7  HGB 14.8  HCT 43.8  PLT 283   ------------------------------------------------------------------------------------------------------------------  Chemistries  Recent Labs  Lab 03/09/19 1050  NA 131*  K 4.3  CL 94*  CO2 23  GLUCOSE 108*  BUN 12  CREATININE 0.65  CALCIUM 9.6  AST 23  ALT 10  ALKPHOS 49  BILITOT 0.9   ------------------------------------------------------------------------------------------------------------------  Cardiac Enzymes No results for input(s): TROPONINI in the last 168 hours. ------------------------------------------------------------------------------------------------------------------  RADIOLOGY:  Ct Head Wo Contrast  Result Date: 03/09/2019 CLINICAL DATA:  Dizziness and nausea with ataxia EXAM: CT HEAD WITHOUT CONTRAST TECHNIQUE: Contiguous axial images were obtained from the base of the skull through the vertex without intravenous contrast. COMPARISON:  January 03, 2017 FINDINGS: Brain: Ventricles are normal in size and configuration. There is no intracranial mass, hemorrhage, extra-axial fluid collection, or midline shift. The brain parenchyma appears unremarkable. There is no evident acute infarct. Vascular: There is no hyperdense vessel. There are foci of arterial vascular calcification in the carotid siphon regions. Skull: The bony calvarium appears intact. Sinuses/Orbits: There is slight mucosal thickening in several ethmoid air cells. Other visualized paranasal sinuses are clear. Visualized orbits appear symmetric bilaterally.  Other: Mastoid air cells are clear. IMPRESSION: Brain parenchyma appears unremarkable. No evident acute infarct. No mass or hemorrhage. There are foci of arterial vascular calcification. There is mild mucosal thickening in several ethmoid air cells. Electronically Signed   By: Lowella Grip III M.D.   On: 03/09/2019 11:06      IMPRESSION AND PLAN:   1.  Acute encephalopathy.  This is likely multifactorial.  Could be related to hypertensive encephalopathy as well as her UTI in addition to possible exertion drug reaction which could be secondary to meclizine addition.  To an observation medical monitored bed.  We will follow neuro checks every 4 hours for 24 hours.  Hold off Flexeril, meclizine and BuSpar cut down on sedatives.  Will obtain a brain MRI  without contrast to rule out frontal CVA.  We will continue Bactrim DS for UTI.  She will be continued on Lopressor and will place on as needed IV labetalol.  2.  Type 2 diabetes mellitus.  She will be placed on supplement coverage with Humalog and will hold off Metformin.  3.  COPD.  We will continue Spiriva and place her on as needed duo nebs.  4.  Hypothyroidism.  We will check free T3 and free T4 specially given elevated TSH.  5.  IBS.  We will continue Amitiza.  6.  Depression with anxiety.  We will continue Prozac for now.  7.  DVT prophylaxis.  Subcutaneous Lovenox    All the records are reviewed and case discussed with ED provider. The plan of care was discussed in details with the patient (and family). I answered all questions. The patient agreed to proceed with the above mentioned plan. Further management will depend upon hospital course.   CODE STATUS: Full code  TOTAL TIME TAKING CARE OF THIS PATIENT: 55 minutes.    Hannah Beat M.D on 03/11/2019 at 7:50 AM  Pager - [phone]  After 6pm go to [email or web address] - Social research officer, government  [facility]  Office  [phone]  CC: Primary care  physician; Sherron Monday, MD   Note: This dictation was prepared with Dragon dictation along with smaller phrase technology. Any transcriptional errors that result from this process are unintentional.

## 2019-03-11 NOTE — ED Triage Notes (Addendum)
Pt arrived to ED from home via EMS. Pt states her minister called EMS because pt was acting confused. pt c/o that her eyes, ears, and muscles "are not working quite right". Pt states she is "not sure if she took too much or not enough" of her Prozac. Pt states the print is too small and is blurry so she "doesn't know how many pills were taken". Pt also reports that she was seen here yesterday and was not given a COVID test. Pt states being tested is her right and she would like to be tested now because she voted. Pt denies any COVID symptoms. Pt states she may or not be positive but she want to be tested to make sure. Pt appears anxious and reports that she does suffer from a panic disorder.

## 2019-03-12 DIAGNOSIS — Z90710 Acquired absence of both cervix and uterus: Secondary | ICD-10-CM | POA: Diagnosis not present

## 2019-03-12 DIAGNOSIS — K589 Irritable bowel syndrome without diarrhea: Secondary | ICD-10-CM | POA: Diagnosis present

## 2019-03-12 DIAGNOSIS — R69 Illness, unspecified: Secondary | ICD-10-CM | POA: Diagnosis not present

## 2019-03-12 DIAGNOSIS — M797 Fibromyalgia: Secondary | ICD-10-CM | POA: Diagnosis present

## 2019-03-12 DIAGNOSIS — I10 Essential (primary) hypertension: Secondary | ICD-10-CM | POA: Diagnosis present

## 2019-03-12 DIAGNOSIS — G4700 Insomnia, unspecified: Secondary | ICD-10-CM | POA: Diagnosis present

## 2019-03-12 DIAGNOSIS — Z79890 Hormone replacement therapy (postmenopausal): Secondary | ICD-10-CM | POA: Diagnosis not present

## 2019-03-12 DIAGNOSIS — N390 Urinary tract infection, site not specified: Secondary | ICD-10-CM | POA: Diagnosis not present

## 2019-03-12 DIAGNOSIS — Z23 Encounter for immunization: Secondary | ICD-10-CM | POA: Diagnosis not present

## 2019-03-12 DIAGNOSIS — R410 Disorientation, unspecified: Secondary | ICD-10-CM | POA: Diagnosis present

## 2019-03-12 DIAGNOSIS — G9349 Other encephalopathy: Secondary | ICD-10-CM | POA: Diagnosis present

## 2019-03-12 DIAGNOSIS — G9340 Encephalopathy, unspecified: Secondary | ICD-10-CM | POA: Diagnosis not present

## 2019-03-12 DIAGNOSIS — R4182 Altered mental status, unspecified: Secondary | ICD-10-CM | POA: Diagnosis present

## 2019-03-12 DIAGNOSIS — E119 Type 2 diabetes mellitus without complications: Secondary | ICD-10-CM | POA: Diagnosis not present

## 2019-03-12 DIAGNOSIS — J441 Chronic obstructive pulmonary disease with (acute) exacerbation: Secondary | ICD-10-CM | POA: Diagnosis present

## 2019-03-12 DIAGNOSIS — Z7984 Long term (current) use of oral hypoglycemic drugs: Secondary | ICD-10-CM | POA: Diagnosis not present

## 2019-03-12 DIAGNOSIS — Z87891 Personal history of nicotine dependence: Secondary | ICD-10-CM | POA: Diagnosis not present

## 2019-03-12 DIAGNOSIS — F4310 Post-traumatic stress disorder, unspecified: Secondary | ICD-10-CM | POA: Diagnosis present

## 2019-03-12 DIAGNOSIS — Z20828 Contact with and (suspected) exposure to other viral communicable diseases: Secondary | ICD-10-CM | POA: Diagnosis present

## 2019-03-12 DIAGNOSIS — E860 Dehydration: Secondary | ICD-10-CM | POA: Diagnosis present

## 2019-03-12 DIAGNOSIS — E871 Hypo-osmolality and hyponatremia: Secondary | ICD-10-CM | POA: Diagnosis present

## 2019-03-12 DIAGNOSIS — R946 Abnormal results of thyroid function studies: Secondary | ICD-10-CM | POA: Diagnosis present

## 2019-03-12 DIAGNOSIS — F3340 Major depressive disorder, recurrent, in remission, unspecified: Secondary | ICD-10-CM | POA: Diagnosis present

## 2019-03-12 DIAGNOSIS — Z888 Allergy status to other drugs, medicaments and biological substances status: Secondary | ICD-10-CM | POA: Diagnosis not present

## 2019-03-12 DIAGNOSIS — R8271 Bacteriuria: Secondary | ICD-10-CM | POA: Diagnosis present

## 2019-03-12 DIAGNOSIS — Z79899 Other long term (current) drug therapy: Secondary | ICD-10-CM | POA: Diagnosis not present

## 2019-03-12 DIAGNOSIS — Z7982 Long term (current) use of aspirin: Secondary | ICD-10-CM | POA: Diagnosis not present

## 2019-03-12 DIAGNOSIS — E039 Hypothyroidism, unspecified: Secondary | ICD-10-CM | POA: Diagnosis present

## 2019-03-12 DIAGNOSIS — F418 Other specified anxiety disorders: Secondary | ICD-10-CM | POA: Diagnosis present

## 2019-03-12 DIAGNOSIS — I160 Hypertensive urgency: Secondary | ICD-10-CM | POA: Diagnosis not present

## 2019-03-12 LAB — GLUCOSE, CAPILLARY
Glucose-Capillary: 109 mg/dL — ABNORMAL HIGH (ref 70–99)
Glucose-Capillary: 108 mg/dL — ABNORMAL HIGH (ref 70–99)
Glucose-Capillary: 87 mg/dL (ref 70–99)
Glucose-Capillary: 134 mg/dL — ABNORMAL HIGH (ref 70–99)

## 2019-03-12 LAB — BASIC METABOLIC PANEL
Anion gap: 10 (ref 5–15)
BUN: 12 mg/dL (ref 8–23)
CO2: 24 mmol/L (ref 22–32)
Calcium: 9.2 mg/dL (ref 8.9–10.3)
Chloride: 93 mmol/L — ABNORMAL LOW (ref 98–111)
Creatinine, Ser: 0.79 mg/dL (ref 0.44–1.00)
GFR calc Af Amer: >60 mL/min (ref >60)
GFR calc non Af Amer: >60 mL/min (ref >60)
Glucose, Bld: 97 mg/dL (ref 70–99)
Potassium: 3.8 mmol/L (ref 3.5–5.1)
Sodium: 127 mmol/L — ABNORMAL LOW (ref 135–145)

## 2019-03-12 LAB — CBC
HCT: 40 % (ref 36.0–46.0)
Hemoglobin: 13.7 g/dL (ref 12.0–15.0)
MCH: 31.6 pg (ref 26.0–34.0)
MCHC: 34.3 g/dL (ref 30.0–36.0)
MCV: 92.2 fL (ref 80.0–100.0)
Platelets: 267 10*3/uL (ref 150–400)
RBC: 4.34 MIL/uL (ref 3.87–5.11)
RDW: 12.7 % (ref 11.5–15.5)
WBC: 7.1 10*3/uL (ref 4.0–10.5)
nRBC: 0 % (ref 0.0–0.2)

## 2019-03-12 NOTE — Care Management Obs Status (Signed)
[facility] NOTIFICATION   Patient Details  Name: BRITTNE KAWASAKI MRN: [id number] Date of Birth: 05/18/1946   Medicare Observation Status Notification Given:  Yes    Tarhonda Hollenberg A Jisell Majer, RN 03/12/2019, 8:29 AM

## 2019-03-12 NOTE — Progress Notes (Signed)
[place] [facility] [place] NAME: [name]    MR#:  [id number]  DATE OF BIRTH:  1945/06/18  SUBJECTIVE:  CHIEF COMPLAINT:   Chief Complaint  Patient presents with  . Drug Overdose   Improved. Less anxiety. Alert and oriented Afebrile  No dysuria/frequency  REVIEW OF SYSTEMS:    Review of Systems  Constitutional: Positive for malaise/fatigue. Negative for chills, fever and weight loss.  HENT: Negative for hearing loss and nosebleeds.   Eyes: Negative for blurred vision, double vision and pain.  Respiratory: Negative for cough, hemoptysis, sputum production, shortness of breath and wheezing.   Cardiovascular: Negative for chest pain, palpitations, orthopnea and leg swelling.  Gastrointestinal: Negative for abdominal pain, constipation, diarrhea, nausea and vomiting.  Genitourinary: Negative for dysuria and hematuria.  Musculoskeletal: Negative for back pain, falls and myalgias.  Skin: Negative for rash.  Neurological: Negative for dizziness, tremors, sensory change, speech change, focal weakness, seizures and headaches.  Endo/Heme/Allergies: Does not bruise/bleed easily.  Psychiatric/Behavioral: Positive for depression. Negative for memory loss. The patient is nervous/anxious.     DRUG ALLERGIES:   Allergies  Allergen Reactions  . Celecoxib     Other reaction(s): Other (qualifier value) Patient is allergic to [facility] and found that it caused heart failure. CHF  . Pregabalin     Other reaction(s): Localized superficial swelling of skin    VITALS:  Blood pressure (!) 111/59, pulse (!) 57, temperature (!) 97.5 F (36.4 C), temperature source Oral, resp. rate 16, height 5\' 6"  (1.676 m), weight 50.8 kg, SpO2 95 %.  PHYSICAL EXAMINATION:   Physical Exam  GENERAL:  73 y.o.-year-old patient lying in the bed with no acute distress.  EYES: Pupils equal, round, reactive to light and accommodation. No scleral icterus. Extraocular  muscles intact.  HEENT: Head atraumatic, normocephalic. Oropharynx and nasopharynx clear.  NECK:  Supple, no jugular venous distention. No thyroid enlargement, no tenderness.  LUNGS: Normal work of breathing.  Mild wheezing. CARDIOVASCULAR: S1, S2 normal. No murmurs, rubs, or gallops.  ABDOMEN: Soft, nontender, nondistended. Bowel sounds present. No organomegaly or mass.  EXTREMITIES: No cyanosis, clubbing or edema b/l.    NEUROLOGIC: Cranial nerves II through XII are intact. No focal Motor or sensory deficits b/l.   PSYCHIATRIC: The patient is alert and oriented x 3.  SKIN: No obvious rash, lesion, or ulcer.   LABORATORY PANEL:   CBC Recent Labs  Lab 03/12/19 0448  WBC 7.1  HGB 13.7  HCT 40.0  PLT 267   ------------------------------------------------------------------------------------------------------------------ Chemistries  Recent Labs  Lab 03/09/19 1050  03/12/19 0448  NA 131*   < > 127*  K 4.3   < > 3.8  CL 94*   < > 93*  CO2 23   < > 24  GLUCOSE 108*   < > 97  BUN 12   < > 12  CREATININE 0.65   < > 0.79  CALCIUM 9.6   < > 9.2  AST 23  --   --   ALT 10  --   --   ALKPHOS 49  --   --   BILITOT 0.9  --   --    < > = values in this interval not displayed.   ------------------------------------------------------------------------------------------------------------------  Cardiac Enzymes No results for input(s): TROPONINI in the last 168 hours. ------------------------------------------------------------------------------------------------------------------  RADIOLOGY:  Mr Brain Wo Contrast  Result Date: 03/11/2019 CLINICAL DATA:  Confusion, acute, unexplained altered mental status. EXAM: MRI HEAD WITHOUT CONTRAST  TECHNIQUE: Multiplanar, multiecho pulse sequences of the brain and surrounding structures were obtained without intravenous contrast. COMPARISON:  Noncontrast head CT 03/09/2019, brain MRI 12/27/2013 FINDINGS: Brain: Multiple sequences are motion  degraded. There is no convincing evidence of acute infarct. No evidence of intracranial mass. No midline shift or extra-axial fluid collection. No chronic intracranial blood products. Minimal scattered T2/FLAIR hyperintense signal changes within the cerebral white matter and brainstem are nonspecific, but consistent with chronic small vessel ischemic disease. Cerebral volume is normal for age. Vascular: Flow voids maintained within the proximal large arterial vessels. Skull and upper cervical spine: No focal marrow lesion Sinuses/Orbits: Visualized orbits demonstrate no acute abnormality. Trace ethmoid sinus mucosal thickening. No significant mastoid effusion. IMPRESSION: 1. Motion degraded examination. 2. No evidence of acute intracranial abnormality. 3. Minimal signal changes within the cerebral white matter and brainstem are nonspecific, but consistent with chronic small vessel ischemic disease. Electronically Signed   By: Jackey Loge DO   On: 03/11/2019 16:16   ASSESSMENT AND PLAN:   * Acute encephalopathy likely due to combination of not taking her medications and also anxiety No UTI. I do not think hypothyroidism caused any acute changes. Hyponatremia still the same Psychiatry team titrating medications  * COPD exacerbation, Mild Wheezing on exam. Will give DuoNeb nebulizer treatments.  Avoid steroids as this can worsen her anxiety/confusion Inhalers from home ordered  *Asymptomatic bacteriuria.  No dysuria or urinary frequency.  Stop antibiotics.  Afebrile.  Normal WBC.  *Mild hyponatremia, asymptomatic.  Euvolemic.  Could be due to hypothyroidism. Will fluid restrict.  *Diabetes mellitus.  Sliding scale insulin.  DVT prophylaxis with Lovenox  Monitor for any worsening of sodium levels overnight.  Likely discharge home tomorrow    All the records are reviewed and case discussed with Care Management/Social Worker Management plans discussed with the patient, family and they are in  agreement.  CODE STATUS: FULL CODE  TOTAL TIME TAKING CARE OF THIS PATIENT: 35 minutes.   POSSIBLE D/C IN 1-2 DAYS, DEPENDING ON CLINICAL CONDITION.  Molinda Bailiff Royalty Fakhouri M.D on 03/12/2019 at 10:31 AM  Between 7am to 6pm - Pager - [phone]  After 6pm go to [email or web address] - password [email or web address] [facility]  [facility] [place] [facility]  Office  [phone]  CC: Primary care physician; Sherron Monday, MD  Note: This dictation was prepared with Dragon dictation along with smaller phrase technology. Any transcriptional errors that result from this process are unintentional.

## 2019-03-13 DIAGNOSIS — I160 Hypertensive urgency: Secondary | ICD-10-CM | POA: Diagnosis not present

## 2019-03-13 DIAGNOSIS — G9340 Encephalopathy, unspecified: Secondary | ICD-10-CM | POA: Diagnosis not present

## 2019-03-13 DIAGNOSIS — E119 Type 2 diabetes mellitus without complications: Secondary | ICD-10-CM | POA: Diagnosis not present

## 2019-03-13 DIAGNOSIS — N390 Urinary tract infection, site not specified: Secondary | ICD-10-CM | POA: Diagnosis not present

## 2019-03-13 LAB — BASIC METABOLIC PANEL
Anion gap: 7 (ref 5–15)
BUN: 14 mg/dL (ref 8–23)
CO2: 26 mmol/L (ref 22–32)
Calcium: 9.3 mg/dL (ref 8.9–10.3)
Chloride: 96 mmol/L — ABNORMAL LOW (ref 98–111)
Creatinine, Ser: 0.77 mg/dL (ref 0.44–1.00)
GFR calc Af Amer: >60 mL/min (ref >60)
GFR calc non Af Amer: >60 mL/min (ref >60)
Glucose, Bld: 93 mg/dL (ref 70–99)
Potassium: 4.5 mmol/L (ref 3.5–5.1)
Sodium: 129 mmol/L — ABNORMAL LOW (ref 135–145)

## 2019-03-13 LAB — GLUCOSE, CAPILLARY
Glucose-Capillary: 89 mg/dL (ref 70–99)
Glucose-Capillary: 144 mg/dL — ABNORMAL HIGH (ref 70–99)
Glucose-Capillary: 121 mg/dL — ABNORMAL HIGH (ref 70–99)
Glucose-Capillary: 127 mg/dL — ABNORMAL HIGH (ref 70–99)

## 2019-03-13 MED ORDER — INFLUENZA VAC A&B SA ADJ QUAD 0.5 ML IM PRSY
0.5000 mL | PREFILLED_SYRINGE | Freq: Once | INTRAMUSCULAR | Status: AC
  Administered 2019-03-13: 12:00:00 0.5 mL via INTRAMUSCULAR
  Filled 2019-03-13: qty 0.5

## 2019-03-13 MED ORDER — FLUOXETINE HCL 20 MG PO CAPS
40.0000 mg | ORAL_CAPSULE | Freq: Every day | ORAL | Status: DC
  Administered 2019-03-13 – 2019-03-14 (×2): 40 mg via ORAL
  Filled 2019-03-13 (×2): qty 2

## 2019-03-13 MED ORDER — INFLUENZA VAC A&B SA ADJ QUAD 0.5 ML IM PRSY: 0.5000 mL | PREFILLED_SYRINGE | INTRAMUSCULAR | Status: DC

## 2019-03-13 NOTE — Progress Notes (Deleted)
Patient says she has not had

## 2019-03-13 NOTE — TOC Initial Note (Signed)
Transition of Care [facility]) - Initial/Assessment Note    Patient Details  Name: [name] MRN: [id number] Date of Birth: 01-09-1946  [facility]) CM/SW Contact:    Shelbie Hutching, RN Phone Number: 03/13/2019, 10:06 AM  Clinical Narrative:                 Patient admitted with altered mental status.  Patient reports that she still does not feel ready to discharge, patient reports that she received a medication last night that gave her bad dreams and she reports she would like that to be addressed before she goes home.  Patient has filed an appeal with [employer].   Patient is from home and lives alone.  Patient is established with [facility] and has a case worker with [place].  Patient has personal care aids that come out everyday for 2 hours from [facility], set up through [facility], arranged by [employer].  Patient has a cane and a rollator, she would like a 3 in 1 at discharge. RNCM will set up home health at discharge.  Patient is worried that she will get her medications mixed up or forget to take them at night.   RNCM will cont to follow.    Expected Discharge Plan: [place] Services Barriers to Discharge: Other (comment)(Patient not comfortable being discharged- reports her mental status is still altered)   Patient Goals and CMS Choice Patient states their goals for this hospitalization and ongoing recovery are:: Patient would like for her mind to be clearer      Expected Discharge Plan and Services Expected Discharge Plan: [place]   Discharge Planning Services: CM Consult   Living arrangements for the past 2 months: Apartment                                      Prior Living Arrangements/Services Living arrangements for the past 2 months: Apartment Lives with:: Self Patient language and need for interpreter reviewed:: No Do you feel safe going back to the place where you live?: Yes      Need for Family  Participation in Patient Care: Yes (Comment)([facility] and lives alone) Care giver support system in place?: Yes (comment)([facility] case worker) Current home services: Biomedical scientist, DME(cane and rollator) Criminal Activity/Legal Involvement Pertinent to Current Situation/Hospitalization: No - Comment as needed  Activities of Daily Living   ADL Screening (condition at time of admission) Patient's cognitive ability adequate to safely complete daily activities?: Yes Is the patient deaf or have difficulty hearing?: No Does the patient have difficulty seeing, even when wearing glasses/contacts?: No Does the patient have difficulty concentrating, remembering, or making decisions?: No Patient able to express need for assistance with ADLs?: Yes Does the patient have difficulty dressing or bathing?: No Independently performs ADLs?: Yes (appropriate for developmental age) Does the patient have difficulty walking or climbing stairs?: Yes Weakness of Legs: Both Weakness of Arms/Hands: None  Permission Sought/Granted Permission sought to share information with : Case Manager, Other (comment) Permission granted to share information with : Yes, Verbal Permission Granted     Permission granted to share info w AGENCY: home health agency of choice        Emotional Assessment Appearance:: Appears stated age Attitude/Demeanor/Rapport: Engaged Affect (typically observed): Anxious Orientation: : Oriented to Self, Oriented to Place, Oriented to  Time, Oriented to Situation Alcohol /  Substance Use: Not Applicable Psych Involvement: No (comment)  Admission diagnosis:  Confusion [R41.0] Altered mental status, unspecified altered mental status type [R41.82] Patient Active Problem List   Diagnosis Date Noted  . AMS (altered mental status) 03/12/2019  . Acute encephalopathy 03/11/2019  . Hypothyroidism 03/11/2019  . Altered mental status, unspecified 03/11/2019  . Orthostatic hypotension 01/12/2017  .  Bronchitis 01/04/2017  . Unstable angina (HCC) 06/13/2015  . UTI (lower urinary tract infection) 06/13/2015  . Degenerative disc disease, lumbar 10/18/2014  . Sacroiliac joint dysfunction 10/18/2014  . Facet syndrome, lumbar 10/18/2014  . Cervical facet joint syndrome 10/18/2014  . Bilateral occipital neuralgia 10/18/2014  . Migraine 10/18/2014  . Neurosis, posttraumatic 10/08/2014  . Spinal stenosis   . PTSD (post-traumatic stress disorder)    PCP:  Sherron Monday, MD Pharmacy:   [facility] [place], [place] - [address] [place] [address] Phone: [phone] Fax: [phone]     Social Determinants of Health (SDOH) Interventions    Readmission Risk Interventions No flowsheet data found.

## 2019-03-13 NOTE — Discharge Instructions (Addendum)
Resume diet and activity as before  Pt to f/u [facility] at [facility] as she needs them

## 2019-03-14 DIAGNOSIS — N390 Urinary tract infection, site not specified: Secondary | ICD-10-CM | POA: Diagnosis not present

## 2019-03-14 DIAGNOSIS — G9340 Encephalopathy, unspecified: Secondary | ICD-10-CM | POA: Diagnosis not present

## 2019-03-14 DIAGNOSIS — E119 Type 2 diabetes mellitus without complications: Secondary | ICD-10-CM | POA: Diagnosis not present

## 2019-03-14 DIAGNOSIS — I160 Hypertensive urgency: Secondary | ICD-10-CM | POA: Diagnosis not present

## 2019-03-14 LAB — GLUCOSE, CAPILLARY
Glucose-Capillary: 115 mg/dL — ABNORMAL HIGH (ref 70–99)
Glucose-Capillary: 173 mg/dL — ABNORMAL HIGH (ref 70–99)

## 2019-03-14 LAB — T3, FREE: T3, Free: 1.3 pg/mL — ABNORMAL LOW (ref 2.0–4.4)

## 2019-03-14 MED ORDER — LEVOTHYROXINE SODIUM 100 MCG PO TABS: 100.0000 ug | ORAL_TABLET | Freq: Every day | ORAL | 0 refills | Status: DC

## 2019-03-14 MED ORDER — LEVOTHYROXINE SODIUM 100 MCG PO TABS: 100.0000 ug | ORAL_TABLET | Freq: Every day | ORAL | Status: DC

## 2019-03-14 NOTE — TOC Transition Note (Signed)
Transition of Care [facility]) - CM/SW Discharge Note   Patient Details  Name: [name] MRN: [id number] Date of Birth: 07-Jun-1945  [facility] CM/SW Contact:  Shelbie Hutching, RN Phone Number: 03/14/2019, 10:17 AM   Clinical Narrative:    Patient's discharge appeal is currently under review.  MD talked with patient this morning and has explained to the patient that no new treatments will be implemented and medical team feels that patient remains medically stable for discharge.  Patient is concerned that her appeal will be denied and she will have a large hospital bill.  Patient reports to [facility] that she just wants to go home.   RNCM will arrange home health services.  Home Health referral given to [place] with [facility] for RN and PT.  Patient's case worker with [facility], Patsy [place] will provide transportation at discharge.  Patient has her aid services that come out every day.  Patient requests 3 in 1 - Brad with [employer] will bring 3 in 1 up to the room before discharge.    Final next level of care: [place] Barriers to Discharge: Barriers Resolved   Patient Goals and CMS Choice Patient states their goals for this hospitalization and ongoing recovery are:: Patient would like for her mind to be clearer CMS Medicare.gov Compare Post Acute Care list provided to:: Patient Choice offered to / list presented to : Patient  Discharge Placement                       Discharge Plan and Services   Discharge Planning Services: CM Consult            DME Arranged: 3-N-1 DME Agency: [employer] Date DME Agency Contacted: 03/14/19 Time DME Agency Contacted: [id number] Representative spoke with at DME Agency: [place]: RN, PT [facility] Agency: [facility] [place] Date [place]: 03/14/19 Time [place]: 1017 Representative spoke with at [place]: [place] Interventions     Readmission  Risk Interventions No flowsheet data found.

## 2019-03-14 NOTE — Progress Notes (Signed)
[facility] [place] [facility]   PATIENT NAME: [name]    MR#:  [id number]  DATE OF BIRTH:  [birth year]  SUBJECTIVE:  CHIEF COMPLAINT:   Chief Complaint  Patient presents with  . Drug Overdose   Alert and oriented. Says she is confused but she is not .  REVIEW OF SYSTEMS:    Review of Systems  Constitutional: Positive for malaise/fatigue. Negative for chills, fever and weight loss.  HENT: Negative for hearing loss and nosebleeds.   Eyes: Negative for blurred vision, double vision and pain.  Respiratory: Negative for cough, hemoptysis, sputum production, shortness of breath and wheezing.   Cardiovascular: Negative for chest pain, palpitations, orthopnea and leg swelling.  Gastrointestinal: Negative for abdominal pain, constipation, diarrhea, nausea and vomiting.  Genitourinary: Negative for dysuria and hematuria.  Musculoskeletal: Negative for back pain, falls and myalgias.  Skin: Negative for rash.  Neurological: Negative for dizziness, tremors, sensory change, speech change, focal weakness, seizures and headaches.  Endo/Heme/Allergies: Does not bruise/bleed easily.  Psychiatric/Behavioral: Positive for depression. Negative for memory loss. The patient is nervous/anxious.    DRUG ALLERGIES:   Allergies  Allergen Reactions  . Celecoxib     Other reaction(s): Other (qualifier value) Patient is allergic to [facility] and found that it caused heart failure. CHF  . Pregabalin     Other reaction(s): Localized superficial swelling of skin    VITALS:  Blood pressure 112/69, pulse 69, temperature 98.7 F (37.1 C), temperature source Oral, resp. rate 18, height 5\' 6"  (1.676 m), weight 50.8 kg, SpO2 96 %.  PHYSICAL EXAMINATION:   Physical Exam  GENERAL:  73 y.o.-year-old patient lying in the bed with no acute distress.  EYES: Pupils equal, round, reactive to light and accommodation. No scleral icterus. Extraocular muscles intact.  HEENT: Head  atraumatic, normocephalic. Oropharynx and nasopharynx clear.  NECK:  Supple, no jugular venous distention. No thyroid enlargement, no tenderness.  LUNGS: Normal work of breathing.  Mild wheezing. CARDIOVASCULAR: S1, S2 normal. No murmurs, rubs, or gallops.  ABDOMEN: Soft, nontender, nondistended. Bowel sounds present. No organomegaly or mass.  EXTREMITIES: No cyanosis, clubbing or edema b/l.    NEUROLOGIC: Cranial nerves II through XII are intact. No focal Motor or sensory deficits b/l.   PSYCHIATRIC: The patient is alert and oriented x 3.  SKIN: No obvious rash, lesion, or ulcer.   LABORATORY PANEL:   CBC Recent Labs  Lab 03/12/19 0448  WBC 7.1  HGB 13.7  HCT 40.0  PLT 267   ------------------------------------------------------------------------------------------------------------------ Chemistries  Recent Labs  Lab 03/09/19 1050  03/13/19 0458  NA 131*   < > 129*  K 4.3   < > 4.5  CL 94*   < > 96*  CO2 23   < > 26  GLUCOSE 108*   < > 93  BUN 12   < > 14  CREATININE 0.65   < > 0.77  CALCIUM 9.6   < > 9.3  AST 23  --   --   ALT 10  --   --   ALKPHOS 49  --   --   BILITOT 0.9  --   --    < > = values in this interval not displayed.   ------------------------------------------------------------------------------------------------------------------  Cardiac Enzymes No results for input(s): TROPONINI in the last 168 hours. ------------------------------------------------------------------------------------------------------------------  RADIOLOGY:  No results found. ASSESSMENT AND PLAN:   * Acute encephalopathy likely due to combination of not taking her medications and also anxiety No  UTI. I do not think hypothyroidism caused any acute changes. Hyponatremia improving Psychiatry team titrating medications  * COPD exacerbation, Mild Wheezing on exam. Will give DuoNeb nebulizer treatments.  Avoid steroids as this can worsen her anxiety/confusion Inhalers from  home ordered  *Asymptomatic bacteriuria.  No dysuria or urinary frequency.  Stop antibiotics.  Afebrile.  Normal WBC.  *Mild hyponatremia, asymptomatic.  Euvolemic.  Could be due to hypothyroidism. Will fluid restrict. Improving  * Hypothyroidism TSH elevated to 12. Missing doses. Counseled to take meds regularly  *Diabetes mellitus.  Sliding scale insulin.  DVT prophylaxis with Lovenox  D/C home. Patient has appealed discharge  All the records are reviewed and case discussed with Care Management/Social Worker Management plans discussed with the patient, family and they are in agreement.  CODE STATUS: FULL CODE  TOTAL TIME TAKING CARE OF THIS PATIENT: 35 minutes.   POSSIBLE D/C IN 1-2 DAYS, DEPENDING ON CLINICAL CONDITION.  Leia Alf Vashti Bolanos M.D on 03/14/2019 at 4:01 PM  Between 7am to 6pm - Pager - [phone]  After 6pm go to [email or web address] - password [email or web address] [place] [facility]  Office  [phone]  CC: Primary care physician; Whitaker, Rolanda Jay, PA-C  Note: This dictation was prepared with Dragon dictation along with smaller phrase technology. Any transcriptional errors that result from this process are unintentional.

## 2019-03-14 NOTE — Progress Notes (Signed)
IV removed before discharged. Area CDI.

## 2019-03-14 NOTE — Discharge Summary (Addendum)
[facility] [place] [facility]   PATIENT NAME: [name]   MR#:  [id number]  DATE OF BIRTH:  January 20, 1946  DATE OF ADMISSION:  03/11/2019 ADMITTING PHYSICIAN: Hannah Beat, MD  DATE OF DISCHARGE: 03/14/2019  PRIMARY CARE PHYSICIAN: Wilford Corner, PA-C    ADMISSION DIAGNOSIS:  Confusion [R41.0] Altered mental status, unspecified altered mental status type [R41.82]  DISCHARGE DIAGNOSIS:  Acute confusion  suspect insomnia/anxiety--improved Hypothyroidism Hyponatremia--improving SECONDARY DIAGNOSIS:   Past Medical History:  Diagnosis Date  . Anxiety   . Chest pain   . CHF (congestive heart failure) (HCC)   . Depression   . Diabetes mellitus without complication (HCC)   . Fibromyalgia   . Graves disease   . Hypertension   . IBS (irritable bowel syndrome)   . PTSD (post-traumatic stress disorder)   . Sinus problem   . Spinal stenosis     HOSPITAL COURSE:   * Acute encephalopathy likely due to combination of not taking her medications and also anxiety No UTI. I do not think hypothyroidism caused any acute changes. Hyponatremia improving Psychiatry team titrating medications.  * COPD exacerbation, Mild Will give DuoNeb nebulizer treatments.  Avoid steroids as this can worsen her anxiety/confusion Inhalers from home ordered  *Asymptomatic bacteriuria.  No dysuria or urinary frequency.  Stop antibiotics.  Afebrile.  Normal WBC.  *Mild hyponatremia, asymptomatic.  Euvolemic.  Could be due to hypothyroidism. Will fluid restrict. Improving. Pt's mentation stable, eating well  *Chronic hypothyroidism -TSH 12.5. Patient was on 88 g levothyroxine--- increasing 100 g daily. Patient will follow-up with primary care for follow-up TSH and 4 to 5 week.  *Diabetes mellitus.  Sliding scale insulin.  DVT prophylaxis with Lovenox  Overall patient is at baseline. She has caregiver come for two hours every day. Home health has been  arranged. Patient has appealed her discharge yesterday. I spoke with her so far she is okay with going home today. Social worker informed. CONSULTS OBTAINED:    DRUG ALLERGIES:   Allergies  Allergen Reactions  . Celecoxib     Other reaction(s): Other (qualifier value) Patient is allergic to [facility] and found that it caused heart failure. CHF  . Pregabalin     Other reaction(s): Localized superficial swelling of skin    DISCHARGE MEDICATIONS:   Allergies as of 03/14/2019      Reactions   Celecoxib    Other reaction(s): Other (qualifier value) Patient is allergic to [facility] and found that it caused heart failure. CHF   Pregabalin    Other reaction(s): Localized superficial swelling of skin      Medication List    STOP taking these medications   sulfamethoxazole-trimethoprim 800-160 MG tablet Commonly known as: Bactrim DS     TAKE these medications   albuterol 108 (90 Base) MCG/ACT inhaler Commonly known as: VENTOLIN HFA Inhale 2 puffs into the lungs every 6 (six) hours as needed for wheezing or shortness of breath.   alendronate 70 MG tablet Commonly known as: FOSAMAX Take 70 mg by mouth once a week.   aspirin 81 MG tablet Take 81 mg by mouth daily.   busPIRone 7.5 MG tablet Commonly known as: BUSPAR Take 7.5 mg by mouth 2 (two) times daily.   Calcium 500/D 500-200 MG-UNIT tablet Generic drug: calcium-vitamin D Take 1 tablet by mouth daily.   FLUoxetine 40 MG capsule Commonly known as: PROZAC Take 40 mg by mouth daily.   fluticasone 50 MCG/ACT nasal spray Commonly known  as: FLONASE 2 sprays daily.   hydrocortisone 2.5 % rectal cream Commonly known as: ANUSOL-HC Place 1 application rectally 3 (three) times daily as needed for hemorrhoids or itching. Reported on 12/02/2015   levothyroxine 100 MCG tablet Commonly known as: SYNTHROID Take 1 tablet (100 mcg total) by mouth daily before breakfast. Start taking on: March 15, 2019 What changed:    medication strength  how much to take  Another medication with the same name was removed. Continue taking this medication, and follow the directions you see here.   LORazepam 0.5 MG tablet Commonly known as: ATIVAN Take 0.5 mg by mouth at bedtime.   lubiprostone 8 MCG capsule Commonly known as: AMITIZA Take 8 mcg by mouth 2 (two) times daily with a meal.   Melatonin 3 MG Tabs Take 3 mg by mouth at bedtime.   metFORMIN 500 MG tablet Commonly known as: GLUCOPHAGE Take 500 mg by mouth daily with breakfast.   ondansetron 4 MG disintegrating tablet Commonly known as: Zofran ODT Take 1 tablet (4 mg total) by mouth every 8 (eight) hours as needed for nausea or vomiting.   Potassium 99 MG Tabs Take 1 tablet by mouth daily.   simvastatin 20 MG tablet Commonly known as: ZOCOR Take 20 mg by mouth daily at 6 PM.   Symbicort 160-4.5 MCG/ACT inhaler Generic drug: budesonide-formoterol Inhale 2 puffs into the lungs 2 (two) times daily.   tiotropium 18 MCG inhalation capsule Commonly known as: SPIRIVA Place 18 mcg into inhaler and inhale daily. Reported on 08/07/2015            Durable Medical Equipment  (From admission, onward)         Start     Ordered   03/14/19 1214  For home use only DME 3 n 1  Once     03/14/19 1214          If you experience worsening of your admission symptoms, develop shortness of breath, life threatening emergency, suicidal or homicidal thoughts you must seek medical attention immediately by calling 911 or calling your MD immediately  if symptoms less severe.  You Must read complete instructions/literature along with all the possible adverse reactions/side effects for all the Medicines you take and that have been prescribed to you. Take any new Medicines after you have completely understood and accept all the possible adverse reactions/side effects.   Please note  You were cared for by a hospitalist during your hospital stay. If you have  any questions about your discharge medications or the care you received while you were in the hospital after you are discharged, you can call the unit and asked to speak with the hospitalist on call if the hospitalist that took care of you is not available. Once you are discharged, your primary care physician will handle any further medical issues. Please note that NO REFILLS for any discharge medications will be authorized once you are discharged, as it is imperative that you return to your primary care physician (or establish a relationship with a primary care physician if you do not have one) for your aftercare needs so that they can reassess your need for medications and monitor your lab values. Today   SUBJECTIVE   New complaints. She really slept well. More clear alert oriented times three. Eating breakfast. VITAL SIGNS:  Blood pressure 112/69, pulse 69, temperature 98.7 F (37.1 C), temperature source Oral, resp. rate 18, height 5\' 6"  (1.676 m), weight 50.8 kg, SpO2 96 %.  I/O:    Intake/Output Summary (Last 24 hours) at 03/14/2019 1431 Last data filed at 03/14/2019 0957 Gross per 24 hour  Intake 480 ml  Output -  Net 480 ml    PHYSICAL EXAMINATION:  GENERAL:  73 y.o.-year-old patient lying in the bed with no acute distress.  EYES: Pupils equal, round, reactive to light and accommodation. No scleral icterus. Extraocular muscles intact.  HEENT: Head atraumatic, normocephalic. Oropharynx and nasopharynx clear.  NECK:  Supple, no jugular venous distention. No thyroid enlargement, no tenderness.  LUNGS: Normal breath sounds bilaterally, no wheezing, rales,rhonchi or crepitation. No use of accessory muscles of respiration.  CARDIOVASCULAR: S1, S2 normal. No murmurs, rubs, or gallops.  ABDOMEN: Soft, non-tender, non-distended. Bowel sounds present. No organomegaly or mass.  EXTREMITIES: No pedal edema, cyanosis, or clubbing.  NEUROLOGIC: Cranial nerves II through XII are intact. Muscle  strength 5/5 in all extremities. Sensation intact. Gait not checked.  PSYCHIATRIC: The patient is alert and oriented x 3.  SKIN: No obvious rash, lesion, or ulcer.   DATA REVIEW:   CBC  Recent Labs  Lab 03/12/19 0448  WBC 7.1  HGB 13.7  HCT 40.0  PLT 267    Chemistries  Recent Labs  Lab 03/09/19 1050  03/13/19 0458  NA 131*   < > 129*  K 4.3   < > 4.5  CL 94*   < > 96*  CO2 23   < > 26  GLUCOSE 108*   < > 93  BUN 12   < > 14  CREATININE 0.65   < > 0.77  CALCIUM 9.6   < > 9.3  AST 23  --   --   ALT 10  --   --   ALKPHOS 49  --   --   BILITOT 0.9  --   --    < > = values in this interval not displayed.    Microbiology Results   Recent Results (from the past 240 hour(s))  SARS CORONAVIRUS 2 (TAT 6-24 HRS) Nasopharyngeal Nasopharyngeal Swab     Status: None   Collection Time: 03/11/19  6:09 AM   Specimen: Nasopharyngeal Swab  Result Value Ref Range Status   SARS Coronavirus 2 NEGATIVE NEGATIVE Final    Comment: (NOTE) SARS-CoV-2 target nucleic acids are NOT DETECTED. The SARS-CoV-2 RNA is generally detectable in upper and lower respiratory specimens during the acute phase of infection. Negative results do not preclude SARS-CoV-2 infection, do not rule out co-infections with other pathogens, and should not be used as the sole basis for treatment or other patient management decisions. Negative results must be combined with clinical observations, patient history, and epidemiological information. The expected result is Negative. Fact Sheet for Patients: HairSlick.no Fact Sheet for Healthcare Providers: quierodirigir.com This test is not yet approved or cleared by the [place] FDA and  has been authorized for detection and/or diagnosis of SARS-CoV-2 by FDA under an Emergency Use Authorization (EUA). This EUA will remain  in effect (meaning this test can be used) for the duration of the COVID-19 declaration  under Section 56 4(b)(1) of the Act, 21 U.S.C. section 360bbb-3(b)(1), unless the authorization is terminated or revoked sooner. Performed at [facility], [address], [place], [place] [address]     RADIOLOGY:  No results found.   CODE STATUS:     Code Status Orders  (From admission, onward)         Start     Ordered  03/11/19 0721  Full code  Continuous     03/11/19 0725        Code Status History    Date Active Date Inactive Code Status Order ID Comments User Context   01/12/2017 1843 01/13/2017 1819 Full Code [id number]  Loletha Grayer, MD ED   01/04/2017 0215 01/04/2017 2232 Full Code [id number]  Harvie Bridge, DO Inpatient   06/13/2015 2128 06/14/2015 1950 Full Code [id number]  Bettey Costa, MD Inpatient   Advance Care Planning Activity      TOTAL TIME TAKING CARE OF THIS PATIENT: *40* minutes.    Fritzi Mandes M.D on 03/14/2019 at 2:31 PM  Between 7am to 6pm - Pager - [phone] After 6pm go to [email or web address] - password [email or web address] [facility]  Office  [phone]  CC: Primary care physician; [place]aker, [employer], PA-C

## 2019-03-16 ENCOUNTER — Emergency Department
Admission: EM | Admit: 2019-03-16 | Discharge: 2019-03-16 | Disposition: A | Discharge status: Home / Self Care | Payer: Medicare HMO | Source: Home / Self Care | Attending: Emergency Medicine | Admitting: Emergency Medicine

## 2019-03-16 ENCOUNTER — Encounter: Payer: Self-pay | Admitting: Emergency Medicine

## 2019-03-16 ENCOUNTER — Emergency Department: Payer: Medicare HMO

## 2019-03-16 ENCOUNTER — Other Ambulatory Visit: Payer: Self-pay

## 2019-03-16 DIAGNOSIS — F419 Anxiety disorder, unspecified: Secondary | ICD-10-CM

## 2019-03-16 DIAGNOSIS — E119 Type 2 diabetes mellitus without complications: Secondary | ICD-10-CM | POA: Insufficient documentation

## 2019-03-16 DIAGNOSIS — Z20828 Contact with and (suspected) exposure to other viral communicable diseases: Secondary | ICD-10-CM | POA: Insufficient documentation

## 2019-03-16 DIAGNOSIS — Z79899 Other long term (current) drug therapy: Secondary | ICD-10-CM | POA: Insufficient documentation

## 2019-03-16 DIAGNOSIS — Z7982 Long term (current) use of aspirin: Secondary | ICD-10-CM | POA: Insufficient documentation

## 2019-03-16 DIAGNOSIS — I110 Hypertensive heart disease with heart failure: Secondary | ICD-10-CM | POA: Insufficient documentation

## 2019-03-16 DIAGNOSIS — Z7984 Long term (current) use of oral hypoglycemic drugs: Secondary | ICD-10-CM | POA: Insufficient documentation

## 2019-03-16 DIAGNOSIS — I509 Heart failure, unspecified: Secondary | ICD-10-CM | POA: Insufficient documentation

## 2019-03-16 DIAGNOSIS — M4800 Spinal stenosis, site unspecified: Secondary | ICD-10-CM | POA: Diagnosis not present

## 2019-03-16 DIAGNOSIS — E039 Hypothyroidism, unspecified: Secondary | ICD-10-CM | POA: Insufficient documentation

## 2019-03-16 DIAGNOSIS — Z03818 Encounter for observation for suspected exposure to other biological agents ruled out: Secondary | ICD-10-CM | POA: Diagnosis not present

## 2019-03-16 DIAGNOSIS — Z87891 Personal history of nicotine dependence: Secondary | ICD-10-CM | POA: Insufficient documentation

## 2019-03-16 DIAGNOSIS — E871 Hypo-osmolality and hyponatremia: Secondary | ICD-10-CM | POA: Insufficient documentation

## 2019-03-16 DIAGNOSIS — R05 Cough: Secondary | ICD-10-CM | POA: Diagnosis not present

## 2019-03-16 DIAGNOSIS — R69 Illness, unspecified: Secondary | ICD-10-CM | POA: Diagnosis not present

## 2019-03-16 LAB — CBC
HCT: 41.1 % (ref 36.0–46.0)
Hemoglobin: 14.1 g/dL (ref 12.0–15.0)
MCH: 31.8 pg (ref 26.0–34.0)
MCHC: 34.3 g/dL (ref 30.0–36.0)
MCV: 92.8 fL (ref 80.0–100.0)
Platelets: 241 10*3/uL (ref 150–400)
RBC: 4.43 MIL/uL (ref 3.87–5.11)
RDW: 13.5 % (ref 11.5–15.5)
WBC: 8.4 10*3/uL (ref 4.0–10.5)
nRBC: 0 % (ref 0.0–0.2)

## 2019-03-16 LAB — COMPREHENSIVE METABOLIC PANEL
ALT: 14 U/L (ref 0–44)
AST: 23 U/L (ref 15–41)
Albumin: 4.4 g/dL (ref 3.5–5.0)
Alkaline Phosphatase: 50 U/L (ref 38–126)
Anion gap: 13 (ref 5–15)
BUN: 13 mg/dL (ref 8–23)
CO2: 22 mmol/L (ref 22–32)
Calcium: 9.3 mg/dL (ref 8.9–10.3)
Chloride: 90 mmol/L — ABNORMAL LOW (ref 98–111)
Creatinine, Ser: 0.64 mg/dL (ref 0.44–1.00)
GFR calc Af Amer: >60 mL/min (ref >60)
GFR calc non Af Amer: >60 mL/min (ref >60)
Glucose, Bld: 108 mg/dL — ABNORMAL HIGH (ref 70–99)
Potassium: 4.3 mmol/L (ref 3.5–5.1)
Sodium: 125 mmol/L — ABNORMAL LOW (ref 135–145)
Total Bilirubin: 0.6 mg/dL (ref 0.3–1.2)
Total Protein: 7.2 g/dL (ref 6.5–8.1)

## 2019-03-16 LAB — URINE DRUG SCREEN, QUALITATIVE (ARMC ONLY)
Amphetamines, Ur Screen: NOT DETECTED
Barbiturates, Ur Screen: NOT DETECTED
Benzodiazepine, Ur Scrn: NOT DETECTED
Cannabinoid 50 Ng, Ur ~~LOC~~: NOT DETECTED
Cocaine Metabolite,Ur ~~LOC~~: NOT DETECTED
MDMA (Ecstasy)Ur Screen: NOT DETECTED
Methadone Scn, Ur: NOT DETECTED
Opiate, Ur Screen: NOT DETECTED
Phencyclidine (PCP) Ur S: NOT DETECTED
Tricyclic, Ur Screen: NOT DETECTED

## 2019-03-16 LAB — URINALYSIS, COMPLETE (UACMP) WITH MICROSCOPIC
Bacteria, UA: NONE SEEN
Bilirubin Urine: NEGATIVE
Glucose, UA: NEGATIVE mg/dL
Ketones, ur: 5 mg/dL — AB
Nitrite: NEGATIVE
Protein, ur: NEGATIVE mg/dL
Specific Gravity, Urine: 1.009 (ref 1.005–1.030)
pH: 6 (ref 5.0–8.0)

## 2019-03-16 LAB — SALICYLATE LEVEL: Salicylate Lvl: <7 mg/dL (ref 2.8–30.0)

## 2019-03-16 LAB — ETHANOL: Alcohol, Ethyl (B): <10 mg/dL (ref <10)

## 2019-03-16 LAB — ACETAMINOPHEN LEVEL: Acetaminophen (Tylenol), Serum: <10 ug/mL — ABNORMAL LOW (ref 10–30)

## 2019-03-16 MED ORDER — SODIUM CHLORIDE 0.9 % IV BOLUS
500.0000 mL | Freq: Once | INTRAVENOUS | Status: AC
  Administered 2019-03-16: 500 mL via INTRAVENOUS

## 2019-03-16 NOTE — ED Provider Notes (Signed)
[facility] Emergency Department Provider Note  ____________________________________________  Time seen: Approximately 7:59 PM  I have reviewed the triage vital signs and the nursing notes.   HISTORY  Chief Complaint Altered Mental Status    HPI [name] is a 73 y.o. female with a history of CHF diabetes hypertension IBS PTSD who comes to ED complaining of being confused.  She was recently admitted to the hospital for hyponatremia and confusion, improved with fluid restriction, also treated for UTI and sent home.  The patient is able to describe in detail serial sequences of events over the last several days and her conversations with multiple people and all of her caregivers and support services that she has in the outpatient setting.  Denies any pain or falls or trauma.  She reports being study on her feet and that her neighbor gave her a Rollator that she likes to use sometimes to.      Past Medical History:  Diagnosis Date  . Anxiety   . Chest pain   . CHF (congestive heart failure) (HCC)   . Depression   . Diabetes mellitus without complication (HCC)   . Fibromyalgia   . Graves disease   . Hypertension   . IBS (irritable bowel syndrome)   . PTSD (post-traumatic stress disorder)   . Sinus problem   . Spinal stenosis      Patient Active Problem List   Diagnosis Date Noted  . AMS (altered mental status) 03/12/2019  . Acute encephalopathy 03/11/2019  . Hypothyroidism 03/11/2019  . Altered mental status, unspecified 03/11/2019  . Orthostatic hypotension 01/12/2017  . Bronchitis 01/04/2017  . Unstable angina (HCC) 06/13/2015  . UTI (lower urinary tract infection) 06/13/2015  . Degenerative disc disease, lumbar 10/18/2014  . Sacroiliac joint dysfunction 10/18/2014  . Facet syndrome, lumbar 10/18/2014  . Cervical facet joint syndrome 10/18/2014  . Bilateral occipital neuralgia 10/18/2014  . Migraine 10/18/2014  . Neurosis, posttraumatic  10/08/2014  . Spinal stenosis   . PTSD (post-traumatic stress disorder)      Past Surgical History:  Procedure Laterality Date  . ABDOMINAL HYSTERECTOMY    . ESOPHAGOGASTRODUODENOSCOPY (EGD) WITH PROPOFOL N/A 01/10/2015   Procedure: ESOPHAGOGASTRODUODENOSCOPY (EGD) WITH PROPOFOL;  Surgeon: Wallace CullensPaul Y Oh, MD;  [facility] END[facility]COPY;  Service: Gastroenterology;  Laterality: N/A;  . RECTAL SURGERY    . SAVORY DILATION N/A 01/10/2015   Procedure: SAVORY DILATION;  Surgeon: Wallace CullensPaul Y Oh, MD;  Location: [facility] ENDOSCOPY;  Service: Gastroenterology;  Laterality: N/A;  . toenail removal Bilateral    great toes  . TONSILLECTOMY AND ADENOIDECTOMY       Prior to Admission medications   Medication Sig Start Date End Date Taking? Authorizing Provider  albuterol (PROVENTIL HFA;VENTOLIN HFA) 108 (90 Base) MCG/ACT inhaler Inhale 2 puffs into the lungs every 6 (six) hours as needed for wheezing or shortness of breath. 01/04/17   Katha HammingKonidena, Snehalatha, MD  alendronate (FOSAMAX) 70 MG tablet Take 70 mg by mouth once a week. 01/11/19   [provider]  aspirin 81 MG tablet Take 81 mg by mouth daily.    [provider]  busPIRone (BUSPAR) 7.5 MG tablet Take 7.5 mg by mouth 2 (two) times daily.    [provider]  calcium-vitamin D (CALCIUM 500/D) 500-200 MG-UNIT tablet Take 1 tablet by mouth daily.    [provider]  FLUoxetine (PROZAC) 40 MG capsule Take 40 mg by mouth daily.     [provider]  fluticasone Aleda Grana(FLONASE)  50 MCG/ACT nasal spray 2 sprays daily. 03/13/19   [provider]  hydrocortisone (ANUSOL-HC) 2.5 % rectal cream Place 1 application rectally 3 (three) times daily as needed for hemorrhoids or itching. Reported on 12/02/2015    [provider]  levothyroxine (SYNTHROID) 100 MCG tablet Take 1 tablet (100 mcg total) by mouth daily before breakfast. 03/15/19   Enedina Finner, MD  LORazepam (ATIVAN) 0.5 MG tablet Take 0.5 mg by mouth at bedtime.     [provider]  lubiprostone (AMITIZA) 8 MCG capsule Take 8 mcg by mouth 2 (two) times daily with a meal.    [provider]  Melatonin 3 MG TABS Take 3 mg by mouth at bedtime.    [provider]  metFORMIN (GLUCOPHAGE) 500 MG tablet Take 500 mg by mouth daily with breakfast.     [provider]  ondansetron (ZOFRAN ODT) 4 MG disintegrating tablet Take 1 tablet (4 mg total) by mouth every 8 (eight) hours as needed for nausea or vomiting. 03/09/19   Emily Filbert, MD  Potassium 99 MG TABS Take 1 tablet by mouth daily.    [provider]  simvastatin (ZOCOR) 20 MG tablet Take 20 mg by mouth daily at 6 PM.    [provider]  SYMBICORT 160-4.5 MCG/ACT inhaler Inhale 2 puffs into the lungs 2 (two) times daily. 02/20/19   [provider]  tiotropium (SPIRIVA) 18 MCG inhalation capsule Place 18 mcg into inhaler and inhale daily. Reported on 08/07/2015    [provider]     Allergies Celecoxib and Pregabalin   Family History  Problem Relation Age of Onset  . Alcohol abuse Mother   . Depression Mother   . Varicose Veins Mother   . Alcohol abuse Father   . Depression Father   . Alcohol abuse Sister   . Asthma Sister   . COPD Sister   . Depression Sister   . Heart disease Sister   . Hypertension Sister   . Depression Maternal Grandmother   . Diabetes Paternal Grandmother   . Stroke Paternal Grandmother     Social History Social History   Tobacco Use  . Smoking status: Former Smoker    Packs/day: 0.50    Years: 25.00    Pack years: 12.50    Types: Cigarettes    Quit date: 06/03/2016    Years since quitting: 2.7  . Smokeless tobacco: Never Used  Substance Use Topics  . Alcohol use: No    Alcohol/week: 0.0 standard drinks  . Drug use: No    Review of Systems  Constitutional:   No fever or chills.  ENT:   No sore throat. No rhinorrhea. Cardiovascular:   No chest pain or syncope. Respiratory:   No  dyspnea or cough. Gastrointestinal:   Negative for abdominal pain, vomiting and diarrhea.  Musculoskeletal:   Negative for focal pain or swelling All other systems reviewed and are negative except as documented above in ROS and HPI.  ____________________________________________   PHYSICAL EXAM:  VITAL SIGNS: ED Triage Vitals  Enc Vitals Group     BP 03/16/19 1321 122/77     Pulse Rate 03/16/19 1321 87     Resp 03/16/19 1321 16     Temp 03/16/19 1321 99.1 F (37.3 C)     Temp Source 03/16/19 1321 Oral     SpO2 03/16/19 1321 96 %     Weight 03/16/19 1322 111 lb 15.9 oz (50.8 kg)  Height 03/16/19 1322  (1.676 m)     Head Circumference --      Peak Flow --      Pain Score 03/16/19 1322 0     Pain Loc --      Pain Edu? --      Excl. in GC? --     Vital signs reviewed, nursing assessments reviewed.   Constitutional:   Alert and oriented to person place and time. Non-toxic appearance. Eyes:   Conjunctivae are normal. EOMI. PERRL. ENT      Head:   Normocephalic and atraumatic.      Nose:   Wearing a mask.      Mouth/Throat:    Moist mucous membranes      Neck:   No meningismus. Full ROM. Hematological/Lymphatic/Immunilogical:   No cervical lymphadenopathy. Cardiovascular:   RRR. Symmetric bilateral radial and DP pulses.  No murmurs. Cap refill less than 2 seconds. Respiratory:   Normal respiratory effort without tachypnea/retractions. Breath sounds are clear and equal bilaterally. No wheezes/rales/rhonchi. Gastrointestinal:   Soft and nontender. Non distended. There is no CVA tenderness.  No rebound, rigidity, or guarding.  Musculoskeletal:   Normal range of motion in all extremities. No joint effusions.  No lower extremity tenderness.  No edema. Neurologic:   Normal speech and language.  Motor grossly intact.  Cerebellar function intact. Steady unassisted gait No acute focal neurologic deficits are appreciated.  Skin:    Skin is warm, dry and intact. No rash noted.   No petechiae, purpura, or bullae.  ____________________________________________    LABS (pertinent positives/negatives) (all labs ordered are listed, but only abnormal results are displayed) Labs Reviewed  COMPREHENSIVE METABOLIC PANEL - Abnormal; Notable for the following components:      Result Value   Sodium 125 (*)    Chloride 90 (*)    Glucose, Bld 108 (*)    All other components within normal limits  ACETAMINOPHEN LEVEL - Abnormal; Notable for the following components:   Acetaminophen (Tylenol), Serum <10 (*)    All other components within normal limits  URINALYSIS, COMPLETE (UACMP) WITH MICROSCOPIC - Abnormal; Notable for the following components:   Color, Urine YELLOW (*)    APPearance CLEAR (*)    Hgb urine dipstick SMALL (*)    Ketones, ur 5 (*)    Leukocytes,Ua SMALL (*)    All other components within normal limits  SARS CORONAVIRUS 2 (TAT 6-24 HRS)  ETHANOL  SALICYLATE LEVEL  CBC  URINE DRUG SCREEN, QUALITATIVE ([facility] ONLY)   ____________________________________________   EKG    ____________________________________________    RADIOLOGY  No results found.  ____________________________________________   PROCEDURES Procedures  ____________________________________________    CLINICAL IMPRESSION / ASSESSMENT AND PLAN / ED COURSE  Medications ordered in the ED: Medications  sodium chloride 0.9 % bolus 500 mL (0 mLs Intravenous Stopped 03/16/19 1916)    Pertinent labs & imaging results that were available during my care of the patient were reviewed by me and considered in my medical decision making (see chart for details).  [name] was evaluated in Emergency Department on 03/16/2019 for the symptoms described in the history of present illness. She was evaluated in the context of the global COVID-19 pandemic, which necessitated consideration that the patient might be at risk for infection with the SARS-CoV-2 virus that causes COVID-19.  Institutional protocols and algorithms that pertain to the evaluation of patients at risk for COVID-19 are in a state of rapid change based on  information released by regulatory bodies including the CDC and federal and state organizations. These policies and algorithms were followed during the patient's care in the ED.   Patient presents with complaint of confusion however she is fully oriented and quite lucid and has good recall of recent events.  Seems to be anxious more than anything.  Vital signs are normal.  Labs are also reassuring although she does have some mild hyponatremia, she is neurologically intact.  Also she is not having hallucinations in the prior reports of "seeing bugs" are fully attributable things the patient explains including noticing herself that when she got closer to the hospital door she could see that it was a black string, etc.  She was able to correct her own mistake and it seems to be based on visual acuity issues.  Stable for discharge home.      ____________________________________________   FINAL CLINICAL IMPRESSION(S) / ED DIAGNOSES    Final diagnoses:  Anxiety  Hyponatremia     ED Discharge Orders    None      Portions of this note were generated with dragon dictation software. Dictation errors may occur despite best attempts at proofreading.   Carrie Mew, MD 03/16/19 2005

## 2019-03-16 NOTE — ED Notes (Signed)
Pt noted to be becoming increasingly confused at this time, repeatedly calling for this RN and [employer] officer to come talk to her. Pt states she recognizes that she is confused, states "when I was on the floor when I got out of the bed an alarm would go off but all of these stickers on the floor and people are moving now". Pt remains in sight of this RN and [employer] officer [place].

## 2019-03-16 NOTE — ED Notes (Signed)
[name] caretaker [phone] and Grayland Ormond [phone] or [phone]

## 2019-03-16 NOTE — ED Triage Notes (Addendum)
Patient is in nad but seems a bit fixated on the fact that she has had confusion and forgetfulness about certain things.  She does admit to anxiety and feels this may be the cause.  She also says she has been seeing some things.  At home today saw bugs in a box of food.  And says she though there was a bug on the door of the hospital today, but it was only a string.

## 2019-03-16 NOTE — Discharge Instructions (Signed)
Please continue to limit your water intake as instructed by the hospital team.

## 2019-03-16 NOTE — ED Triage Notes (Signed)
Says her case worker says she was cconfused. Says she does have some dizziness.  Says she was admitted recently for mixing up her medications.  Says she lives in an apartment by herself.  Says she has aids and people from [facility] come too.

## 2019-03-17 ENCOUNTER — Observation Stay: Payer: Medicare HMO

## 2019-03-17 ENCOUNTER — Inpatient Hospital Stay
Admission: EM | Admit: 2019-03-17 | Discharge: 2019-03-23 | DRG: 071 | Disposition: A | Discharge status: Home Health | Payer: Medicare HMO | Attending: Internal Medicine | Admitting: Internal Medicine

## 2019-03-17 ENCOUNTER — Other Ambulatory Visit: Payer: Self-pay

## 2019-03-17 ENCOUNTER — Emergency Department: Payer: Medicare HMO

## 2019-03-17 DIAGNOSIS — Z20828 Contact with and (suspected) exposure to other viral communicable diseases: Secondary | ICD-10-CM | POA: Diagnosis present

## 2019-03-17 DIAGNOSIS — R4182 Altered mental status, unspecified: Secondary | ICD-10-CM | POA: Diagnosis not present

## 2019-03-17 DIAGNOSIS — Z87440 Personal history of urinary (tract) infections: Secondary | ICD-10-CM

## 2019-03-17 DIAGNOSIS — I10 Essential (primary) hypertension: Secondary | ICD-10-CM | POA: Diagnosis not present

## 2019-03-17 DIAGNOSIS — Z79899 Other long term (current) drug therapy: Secondary | ICD-10-CM

## 2019-03-17 DIAGNOSIS — Z7951 Long term (current) use of inhaled steroids: Secondary | ICD-10-CM

## 2019-03-17 DIAGNOSIS — I509 Heart failure, unspecified: Secondary | ICD-10-CM | POA: Diagnosis present

## 2019-03-17 DIAGNOSIS — Z01818 Encounter for other preprocedural examination: Secondary | ICD-10-CM | POA: Diagnosis not present

## 2019-03-17 DIAGNOSIS — R69 Illness, unspecified: Secondary | ICD-10-CM | POA: Diagnosis not present

## 2019-03-17 DIAGNOSIS — Z79890 Hormone replacement therapy (postmenopausal): Secondary | ICD-10-CM

## 2019-03-17 DIAGNOSIS — Z7982 Long term (current) use of aspirin: Secondary | ICD-10-CM

## 2019-03-17 DIAGNOSIS — E119 Type 2 diabetes mellitus without complications: Secondary | ICD-10-CM | POA: Diagnosis present

## 2019-03-17 DIAGNOSIS — Z8249 Family history of ischemic heart disease and other diseases of the circulatory system: Secondary | ICD-10-CM

## 2019-03-17 DIAGNOSIS — Z833 Family history of diabetes mellitus: Secondary | ICD-10-CM

## 2019-03-17 DIAGNOSIS — F4310 Post-traumatic stress disorder, unspecified: Secondary | ICD-10-CM | POA: Diagnosis present

## 2019-03-17 DIAGNOSIS — E039 Hypothyroidism, unspecified: Secondary | ICD-10-CM | POA: Diagnosis present

## 2019-03-17 DIAGNOSIS — R410 Disorientation, unspecified: Secondary | ICD-10-CM | POA: Diagnosis not present

## 2019-03-17 DIAGNOSIS — J449 Chronic obstructive pulmonary disease, unspecified: Secondary | ICD-10-CM | POA: Diagnosis not present

## 2019-03-17 DIAGNOSIS — F329 Major depressive disorder, single episode, unspecified: Secondary | ICD-10-CM | POA: Diagnosis present

## 2019-03-17 DIAGNOSIS — J9811 Atelectasis: Secondary | ICD-10-CM | POA: Diagnosis not present

## 2019-03-17 DIAGNOSIS — Z794 Long term (current) use of insulin: Secondary | ICD-10-CM

## 2019-03-17 DIAGNOSIS — Z818 Family history of other mental and behavioral disorders: Secondary | ICD-10-CM

## 2019-03-17 DIAGNOSIS — G9341 Metabolic encephalopathy: Principal | ICD-10-CM | POA: Diagnosis present

## 2019-03-17 DIAGNOSIS — I110 Hypertensive heart disease with heart failure: Secondary | ICD-10-CM | POA: Diagnosis present

## 2019-03-17 DIAGNOSIS — R404 Transient alteration of awareness: Secondary | ICD-10-CM | POA: Diagnosis not present

## 2019-03-17 DIAGNOSIS — R55 Syncope and collapse: Secondary | ICD-10-CM | POA: Diagnosis not present

## 2019-03-17 DIAGNOSIS — E785 Hyperlipidemia, unspecified: Secondary | ICD-10-CM | POA: Diagnosis present

## 2019-03-17 DIAGNOSIS — Z03818 Encounter for observation for suspected exposure to other biological agents ruled out: Secondary | ICD-10-CM | POA: Diagnosis not present

## 2019-03-17 DIAGNOSIS — G9349 Other encephalopathy: Secondary | ICD-10-CM

## 2019-03-17 DIAGNOSIS — Z87891 Personal history of nicotine dependence: Secondary | ICD-10-CM

## 2019-03-17 DIAGNOSIS — E871 Hypo-osmolality and hyponatremia: Secondary | ICD-10-CM | POA: Diagnosis present

## 2019-03-17 DIAGNOSIS — R05 Cough: Secondary | ICD-10-CM | POA: Diagnosis not present

## 2019-03-17 DIAGNOSIS — Z90710 Acquired absence of both cervix and uterus: Secondary | ICD-10-CM

## 2019-03-17 DIAGNOSIS — F600 Paranoid personality disorder: Secondary | ICD-10-CM | POA: Diagnosis present

## 2019-03-17 DIAGNOSIS — E1169 Type 2 diabetes mellitus with other specified complication: Secondary | ICD-10-CM

## 2019-03-17 DIAGNOSIS — M797 Fibromyalgia: Secondary | ICD-10-CM | POA: Diagnosis present

## 2019-03-17 LAB — CBC WITH DIFFERENTIAL/PLATELET
Abs Immature Granulocytes: 0.06 10*3/uL (ref 0.00–0.07)
Basophils Absolute: 0.1 10*3/uL (ref 0.0–0.1)
Basophils Relative: 1 %
Eosinophils Absolute: 0.2 10*3/uL (ref 0.0–0.5)
Eosinophils Relative: 3 %
HCT: 42 % (ref 36.0–46.0)
Hemoglobin: 14.3 g/dL (ref 12.0–15.0)
Immature Granulocytes: 1 %
Lymphocytes Relative: 20 %
Lymphs Abs: 1.5 10*3/uL (ref 0.7–4.0)
MCH: 32 pg (ref 26.0–34.0)
MCHC: 34 g/dL (ref 30.0–36.0)
MCV: 94 fL (ref 80.0–100.0)
Monocytes Absolute: 0.9 10*3/uL (ref 0.1–1.0)
Monocytes Relative: 12 %
Neutro Abs: 4.7 10*3/uL (ref 1.7–7.7)
Neutrophils Relative %: 63 %
Platelets: 215 10*3/uL (ref 150–400)
RBC: 4.47 MIL/uL (ref 3.87–5.11)
RDW: 13.5 % (ref 11.5–15.5)
WBC: 7.4 10*3/uL (ref 4.0–10.5)
nRBC: 0 % (ref 0.0–0.2)

## 2019-03-17 LAB — URINE DRUG SCREEN, QUALITATIVE (ARMC ONLY)
Amphetamines, Ur Screen: NOT DETECTED
Barbiturates, Ur Screen: NOT DETECTED
Benzodiazepine, Ur Scrn: NOT DETECTED
Cannabinoid 50 Ng, Ur ~~LOC~~: NOT DETECTED
Cocaine Metabolite,Ur ~~LOC~~: NOT DETECTED
MDMA (Ecstasy)Ur Screen: NOT DETECTED
Methadone Scn, Ur: NOT DETECTED
Opiate, Ur Screen: NOT DETECTED
Phencyclidine (PCP) Ur S: NOT DETECTED
Tricyclic, Ur Screen: NOT DETECTED

## 2019-03-17 LAB — URINALYSIS, COMPLETE (UACMP) WITH MICROSCOPIC
Bilirubin Urine: NEGATIVE
Glucose, UA: NEGATIVE mg/dL
Ketones, ur: 20 mg/dL — AB
Nitrite: NEGATIVE
Protein, ur: NEGATIVE mg/dL
Specific Gravity, Urine: 1.005 (ref 1.005–1.030)
pH: 6 (ref 5.0–8.0)

## 2019-03-17 LAB — LACTIC ACID, PLASMA
Lactic Acid, Venous: 1.4 mmol/L (ref 0.5–1.9)
Lactic Acid, Venous: 1.1 mmol/L (ref 0.5–1.9)

## 2019-03-17 LAB — COMPREHENSIVE METABOLIC PANEL
ALT: 13 U/L (ref 0–44)
AST: 28 U/L (ref 15–41)
Albumin: 4.6 g/dL (ref 3.5–5.0)
Alkaline Phosphatase: 51 U/L (ref 38–126)
Anion gap: 11 (ref 5–15)
BUN: 10 mg/dL (ref 8–23)
CO2: 25 mmol/L (ref 22–32)
Calcium: 9.2 mg/dL (ref 8.9–10.3)
Chloride: 95 mmol/L — ABNORMAL LOW (ref 98–111)
Creatinine, Ser: 0.63 mg/dL (ref 0.44–1.00)
GFR calc Af Amer: >60 mL/min (ref >60)
GFR calc non Af Amer: >60 mL/min (ref >60)
Glucose, Bld: 110 mg/dL — ABNORMAL HIGH (ref 70–99)
Potassium: 4 mmol/L (ref 3.5–5.1)
Sodium: 131 mmol/L — ABNORMAL LOW (ref 135–145)
Total Bilirubin: 0.7 mg/dL (ref 0.3–1.2)
Total Protein: 7.4 g/dL (ref 6.5–8.1)

## 2019-03-17 LAB — CK: Total CK: 82 U/L (ref 38–234)

## 2019-03-17 LAB — ETHANOL: Alcohol, Ethyl (B): <10 mg/dL (ref <10)

## 2019-03-17 LAB — AMMONIA: Ammonia: 13 umol/L (ref 9–35)

## 2019-03-17 LAB — SARS CORONAVIRUS 2 (TAT 6-24 HRS): SARS Coronavirus 2: NEGATIVE

## 2019-03-17 LAB — TSH: TSH: 17.226 u[IU]/mL — ABNORMAL HIGH (ref 0.350–4.500)

## 2019-03-17 LAB — T4, FREE: Free T4: 0.66 ng/dL (ref 0.61–1.12)

## 2019-03-17 LAB — SALICYLATE LEVEL: Salicylate Lvl: <7 mg/dL (ref 2.8–30.0)

## 2019-03-17 LAB — ACETAMINOPHEN LEVEL: Acetaminophen (Tylenol), Serum: <10 ug/mL — ABNORMAL LOW (ref 10–30)

## 2019-03-17 MED ORDER — BUSPIRONE HCL 15 MG PO TABS
7.5000 mg | ORAL_TABLET | Freq: Two times a day (BID) | ORAL | Status: DC
  Administered 2019-03-17 – 2019-03-19 (×3): 7.5 mg via ORAL
  Filled 2019-03-17 (×6): qty 1

## 2019-03-17 MED ORDER — ONDANSETRON 4 MG PO TBDP
4.0000 mg | ORAL_TABLET | Freq: Three times a day (TID) | ORAL | Status: DC | PRN
  Administered 2019-03-18: 4 mg via ORAL
  Filled 2019-03-17: qty 1

## 2019-03-17 MED ORDER — FLUTICASONE PROPIONATE 50 MCG/ACT NA SUSP
2.0000 | Freq: Every day | NASAL | Status: DC
  Administered 2019-03-18 – 2019-03-23 (×5): 2 via NASAL
  Filled 2019-03-17: qty 16

## 2019-03-17 MED ORDER — LEVOTHYROXINE SODIUM 100 MCG PO TABS
100.0000 ug | ORAL_TABLET | Freq: Every day | ORAL | Status: DC
  Administered 2019-03-18 – 2019-03-19 (×2): 100 ug via ORAL
  Filled 2019-03-17 (×2): qty 1

## 2019-03-17 MED ORDER — ASPIRIN EC 81 MG PO TBEC
81.0000 mg | DELAYED_RELEASE_TABLET | Freq: Every day | ORAL | Status: DC
  Administered 2019-03-17 – 2019-03-23 (×7): 81 mg via ORAL
  Filled 2019-03-17 (×7): qty 1

## 2019-03-17 MED ORDER — METFORMIN HCL 500 MG PO TABS
500.0000 mg | ORAL_TABLET | Freq: Every day | ORAL | Status: DC
  Administered 2019-03-18 – 2019-03-23 (×6): 500 mg via ORAL
  Filled 2019-03-17 (×6): qty 1

## 2019-03-17 MED ORDER — LORAZEPAM 0.5 MG PO TABS
0.5000 mg | ORAL_TABLET | Freq: Every day | ORAL | Status: DC
  Administered 2019-03-17 – 2019-03-21 (×4): 0.5 mg via ORAL
  Filled 2019-03-17 (×6): qty 1

## 2019-03-17 MED ORDER — POLYETHYLENE GLYCOL 3350 17 G PO PACK: 17.0000 g | PACK | Freq: Every day | ORAL | Status: DC | PRN

## 2019-03-17 MED ORDER — ENOXAPARIN SODIUM 40 MG/0.4ML ~~LOC~~ SOLN
40.0000 mg | SUBCUTANEOUS | Status: DC
  Administered 2019-03-17: 40 mg via SUBCUTANEOUS
  Filled 2019-03-17 (×4): qty 0.4

## 2019-03-17 MED ORDER — SIMVASTATIN 20 MG PO TABS
20.0000 mg | ORAL_TABLET | Freq: Every day | ORAL | Status: DC
  Administered 2019-03-18 – 2019-03-22 (×5): 20 mg via ORAL
  Filled 2019-03-17 (×6): qty 1

## 2019-03-17 MED ORDER — FLUOXETINE HCL 20 MG PO CAPS
40.0000 mg | ORAL_CAPSULE | Freq: Every day | ORAL | Status: DC
  Administered 2019-03-18 – 2019-03-20 (×3): 40 mg via ORAL
  Filled 2019-03-17 (×4): qty 2

## 2019-03-17 MED ORDER — SENNA 8.6 MG PO TABS
1.0000 | ORAL_TABLET | Freq: Two times a day (BID) | ORAL | Status: DC
  Administered 2019-03-17 – 2019-03-23 (×4): 8.6 mg via ORAL
  Filled 2019-03-17 (×10): qty 1

## 2019-03-17 MED ORDER — ACETAMINOPHEN 650 MG RE SUPP: 650.0000 mg | Freq: Four times a day (QID) | RECTAL | Status: DC | PRN

## 2019-03-17 MED ORDER — LUBIPROSTONE 8 MCG PO CAPS
8.0000 ug | ORAL_CAPSULE | Freq: Two times a day (BID) | ORAL | Status: DC
  Administered 2019-03-18 – 2019-03-23 (×11): 8 ug via ORAL
  Filled 2019-03-17 (×12): qty 1

## 2019-03-17 MED ORDER — TIOTROPIUM BROMIDE MONOHYDRATE 18 MCG IN CAPS
18.0000 ug | ORAL_CAPSULE | Freq: Every day | RESPIRATORY_TRACT | Status: DC
  Administered 2019-03-18 – 2019-03-22 (×5): 18 ug via RESPIRATORY_TRACT
  Filled 2019-03-17 (×2): qty 5

## 2019-03-17 MED ORDER — ACETAMINOPHEN 325 MG PO TABS
650.0000 mg | ORAL_TABLET | Freq: Four times a day (QID) | ORAL | Status: DC | PRN
  Administered 2019-03-18 – 2019-03-21 (×3): 650 mg via ORAL
  Filled 2019-03-17 (×3): qty 2

## 2019-03-17 MED ORDER — LORAZEPAM 0.5 MG PO TABS: 0.5000 mg | ORAL_TABLET | Freq: Every day | ORAL | Status: DC

## 2019-03-17 MED ORDER — ONDANSETRON HCL 4 MG PO TABS: 4.0000 mg | ORAL_TABLET | Freq: Four times a day (QID) | ORAL | Status: DC | PRN

## 2019-03-17 MED ORDER — CALCIUM CARBONATE-VITAMIN D 500-200 MG-UNIT PO TABS
1.0000 | ORAL_TABLET | Freq: Every day | ORAL | Status: DC
  Administered 2019-03-17 – 2019-03-23 (×6): 1 via ORAL
  Filled 2019-03-17 (×7): qty 1

## 2019-03-17 MED ORDER — MELATONIN 5 MG PO TABS
5.0000 mg | ORAL_TABLET | Freq: Every day | ORAL | Status: DC
  Administered 2019-03-17 – 2019-03-19 (×2): 5 mg via ORAL
  Filled 2019-03-17 (×7): qty 1

## 2019-03-17 MED ORDER — ALBUTEROL SULFATE (2.5 MG/3ML) 0.083% IN NEBU: 2.5000 mg | INHALATION_SOLUTION | Freq: Four times a day (QID) | RESPIRATORY_TRACT | Status: DC | PRN

## 2019-03-17 MED ORDER — MOMETASONE FURO-FORMOTEROL FUM 200-5 MCG/ACT IN AERO
2.0000 | INHALATION_SPRAY | Freq: Two times a day (BID) | RESPIRATORY_TRACT | Status: DC
  Administered 2019-03-17 – 2019-03-19 (×2): 2 via RESPIRATORY_TRACT
  Filled 2019-03-17: qty 8.8

## 2019-03-17 MED ORDER — ONDANSETRON HCL 4 MG/2ML IJ SOLN: 4.0000 mg | Freq: Four times a day (QID) | INTRAMUSCULAR | Status: DC | PRN

## 2019-03-17 NOTE — ED Notes (Signed)
Pt given meal tray at this time 

## 2019-03-17 NOTE — ED Notes (Signed)
Pt brought in EMS from home, neighbor found her on the ground outside. Pt confused as how she got out there, lives alone. Pt co pain to head, no obvious swelling noted. Pt has no co pain to neck or back. Pt able to move all extremities without any difficulty.

## 2019-03-17 NOTE — ED Provider Notes (Signed)
[facility] Emergency Department Provider Note   ____________________________________________   First MD Initiated Contact with Patient 03/17/19 [phone]     (approximate)  I have reviewed the triage vital signs and the nursing notes.   HISTORY  Chief Complaint Confusion  Level V caveat: Limited by altered mentation  HPI [name] is a 73 y.o. female brought to the ED via EMS from home with a chief complaint of confusion.  A neighbor found patient outside of her house and called 911.  Patient remembers taking out the trash and maybe falling but she is not sure.  She also does not remember changing into her pajamas.  Seen yesterday for confusion.  Recent admission to the hospital for hyponatremia and confusion along with UTI.  Rest of history is unobtainable secondary to patient's altered mental status.       Past Medical History:  Diagnosis Date   Anxiety    Chest pain    CHF (congestive heart failure) (HCC)    Depression    Diabetes mellitus without complication (HCC)    Fibromyalgia    Graves disease    Hypertension    IBS (irritable bowel syndrome)    PTSD (post-traumatic stress disorder)    Sinus problem    Spinal stenosis     Patient Active Problem List   Diagnosis Date Noted   AMS (altered mental status) 03/12/2019   Acute encephalopathy 03/11/2019   Hypothyroidism 03/11/2019   Altered mental status, unspecified 03/11/2019   Orthostatic hypotension 01/12/2017   Bronchitis 01/04/2017   Unstable angina (HCC) 06/13/2015   UTI (lower urinary tract infection) 06/13/2015   Degenerative disc disease, lumbar 10/18/2014   Sacroiliac joint dysfunction 10/18/2014   Facet syndrome, lumbar 10/18/2014   Cervical facet joint syndrome 10/18/2014   Bilateral occipital neuralgia 10/18/2014   Migraine 10/18/2014   Neurosis, posttraumatic 10/08/2014   Spinal stenosis    PTSD (post-traumatic stress disorder)     Past  Surgical History:  Procedure Laterality Date   ABDOMINAL HYSTERECTOMY     ESOPHAGOGASTRODUODENOSCOPY (EGD) WITH PROPOFOL N/A 01/10/2015   Procedure: ESOPHAGOGASTRODUODENOSCOPY (EGD) WITH PROPOFOL;  Surgeon: Wallace Cullens, MD;  Location: [facility] ENDOSCOPY;  Service: Gastroenterology;  Laterality: N/A;   RECTAL SURGERY     SAVORY DILATION N/A 01/10/2015   Procedure: SAVORY DILATION;  Surgeon: Wallace Cullens, MD;  Location: [facility] ENDOSCOPY;  Service: Gastroenterology;  Laterality: N/A;   toenail removal Bilateral    great toes   TONSILLECTOMY AND ADENOIDECTOMY      Prior to Admission medications   Medication Sig Start Date End Date Taking? Authorizing Provider  albuterol (PROVENTIL HFA;VENTOLIN HFA) 108 (90 Base) MCG/ACT inhaler Inhale 2 puffs into the lungs every 6 (six) hours as needed for wheezing or shortness of breath. 01/04/17   Katha Hamming, MD  alendronate (FOSAMAX) 70 MG tablet Take 70 mg by mouth once a week. 01/11/19   [provider]  aspirin 81 MG tablet Take 81 mg by mouth daily.    [provider]  busPIRone (BUSPAR) 7.5 MG tablet Take 7.5 mg by mouth 2 (two) times daily.    [provider]  calcium-vitamin D (CALCIUM 500/D) 500-200 MG-UNIT tablet Take 1 tablet by mouth daily.    [provider]  FLUoxetine (PROZAC) 40 MG capsule Take 40 mg by mouth daily.     [provider]  fluticasone (FLONASE) 50 MCG/ACT nasal spray 2 sprays daily. 03/13/19   [provider]  hydrocortisone (ANUSOL-HC)  2.5 % rectal cream Place 1 application rectally 3 (three) times daily as needed for hemorrhoids or itching. Reported on 12/02/2015    [provider]  levothyroxine (SYNTHROID) 100 MCG tablet Take 1 tablet (100 mcg total) by mouth daily before breakfast. 03/15/19   Fritzi Mandes, MD  LORazepam (ATIVAN) 0.5 MG tablet Take 0.5 mg by mouth at bedtime.    [provider]  lubiprostone (AMITIZA) 8 MCG capsule Take 8 mcg by mouth 2  (two) times daily with a meal.    [provider]  Melatonin 3 MG TABS Take 3 mg by mouth at bedtime.    [provider]  metFORMIN (GLUCOPHAGE) 500 MG tablet Take 500 mg by mouth daily with breakfast.     [provider]  ondansetron (ZOFRAN ODT) 4 MG disintegrating tablet Take 1 tablet (4 mg total) by mouth every 8 (eight) hours as needed for nausea or vomiting. 03/09/19   Earleen Newport, MD  Potassium 99 MG TABS Take 1 tablet by mouth daily.    [provider]  simvastatin (ZOCOR) 20 MG tablet Take 20 mg by mouth daily at 6 PM.    [provider]  SYMBICORT 160-4.5 MCG/ACT inhaler Inhale 2 puffs into the lungs 2 (two) times daily. 02/20/19   [provider]  tiotropium (SPIRIVA) 18 MCG inhalation capsule Place 18 mcg into inhaler and inhale daily. Reported on 08/07/2015    [provider]    Allergies Celecoxib and Pregabalin  Family History  Problem Relation Age of Onset   Alcohol abuse Mother    Depression Mother    Varicose Veins Mother    Alcohol abuse Father    Depression Father    Alcohol abuse Sister    Asthma Sister    COPD Sister    Depression Sister    Heart disease Sister    Hypertension Sister    Depression Maternal Grandmother    Diabetes Paternal Grandmother    Stroke Paternal Grandmother     Social History Social History   Tobacco Use   Smoking status: Former Smoker    Packs/day: 0.50    Years: 25.00    Pack years: 12.50    Types: Cigarettes    Quit date: 06/03/2016    Years since quitting: 2.7   Smokeless tobacco: Never Used  Substance Use Topics   Alcohol use: No    Alcohol/week: 0.0 standard drinks   Drug use: No    Review of Systems  Constitutional: No fever/chills Eyes: No visual changes. ENT: No sore throat. Cardiovascular: Denies chest pain. Respiratory: Denies shortness of breath. Gastrointestinal: No abdominal pain.  No nausea, no vomiting.  No  diarrhea.  No constipation. Genitourinary: Negative for dysuria. Musculoskeletal: Negative for back pain. Skin: Negative for rash. Neurological: Positive for confusion.  Negative for headaches, focal weakness or numbness.   ____________________________________________   PHYSICAL EXAM:  VITAL SIGNS: ED Triage Vitals  Enc Vitals Group     BP      Pulse      Resp      Temp      Temp src      SpO2      Weight      Height      Head Circumference      Peak Flow      Pain Score      Pain Loc      Pain Edu?      Excl. in [place]?  Constitutional: Alert and oriented.  Frail appearing and in no acute distress. Eyes: Conjunctivae are normal. PERRL. EOMI. Head: Atraumatic. Nose: Atraumatic. Mouth/Throat: Mucous membranes are moist.  No dental malocclusion. Neck: No stridor.  No cervical spine tenderness to palpation.  No step-offs or deformities. Cardiovascular: Normal rate, regular rhythm. Grossly normal heart sounds.  Good peripheral circulation. Respiratory: Normal respiratory effort.  No retractions. Lungs CTAB. Gastrointestinal: Soft and nontender to light or deep palpation. No distention. No abdominal bruits. No CVA tenderness. Musculoskeletal: No lower extremity tenderness nor edema.  No joint effusions. Neurologic: Alert and oriented to self.  Normal speech and language. No gross focal neurologic deficits are appreciated. MAEx4. Skin:  Skin is warm, dry and intact. No rash noted. Psychiatric: Mood and affect are normal. Speech and behavior are normal.  ____________________________________________   LABS (all labs ordered are listed, but only abnormal results are displayed)  Labs Reviewed  COMPREHENSIVE METABOLIC PANEL - Abnormal; Notable for the following components:      Result Value   Sodium 131 (*)    Chloride 95 (*)    Glucose, Bld 110 (*)    All other components within normal limits  CBC WITH DIFFERENTIAL/PLATELET  LACTIC ACID, PLASMA  LACTIC ACID, PLASMA    AMMONIA  URINALYSIS, COMPLETE (UACMP) WITH MICROSCOPIC  URINE DRUG SCREEN, QUALITATIVE ([facility] ONLY)  CK  ACETAMINOPHEN LEVEL  ETHANOL  SALICYLATE LEVEL  TSH  T4, FREE   ____________________________________________  EKG  ED ECG REPORT I, Jeff Mccallum J, the attending physician, personally viewed and interpreted this ECG.   Date: 03/17/2019  EKG Time: 0051  Rate: 86  Rhythm: normal EKG, normal sinus rhythm  Axis: Normal  Intervals:none  ST&T Change: Nonspecific  ____________________________________________  RADIOLOGY  ED MD interpretation: No ICH; no acute cardiopulmonary process  Official radiology report(s): Dg Chest 1 View  Result Date: 03/17/2019 CLINICAL DATA:  Confusion. EXAM: CHEST  1 VIEW COMPARISON:  03/16/2019. FINDINGS: EKG leads noted over the chest. Mediastinum and hilar structures normal. COPD. Mild bibasilar subsegmental atelectasis. No pleural effusion or pneumothorax. Heart size normal. No acute bony abnormality identified. Degenerative changes scoliosis thoracic spine. Carotid vascular calcification. IMPRESSION: 1.  COPD.  Mild bibasilar subsegmental atelectasis. 2.  Carotid vascular disease. Electronically Signed   By: Maisie Fus  Register   On: 03/17/2019 06:02   Ct Head Wo Contrast  Result Date: 03/17/2019 CLINICAL DATA:  Mental status change. EXAM: CT HEAD WITHOUT CONTRAST TECHNIQUE: Contiguous axial images were obtained from the base of the skull through the vertex without intravenous contrast. COMPARISON:  Head CT 03/09/2019 FINDINGS: Brain: The ventricles are in the midline without mass effect or shift. No extra-axial fluid collections are identified. The gray-white differentiation is maintained. No CT findings for acute intracranial process such as hemispheric infarction or intracranial hemorrhage. No mass lesions. The brainstem and cerebellum appear normal in stable. Vascular: No hyperdense vessels or aneurysm. Stable vascular calcifications. Skull: No skull  fracture or bone lesion. Hyperostosis frontalis interna. Sinuses/Orbits: The paranasal sinuses and mastoid air cells are clear. The globes are intact. Other: No scalp lesions or hematoma. IMPRESSION: No acute intracranial findings or mass lesion. Electronically Signed   By: Rudie Meyer M.D.   On: 03/17/2019 05:45   Dg Chest Portable 1 View  Result Date: 03/16/2019 CLINICAL DATA:  Confusion and cough EXAM: PORTABLE CHEST 1 VIEW COMPARISON:  01/12/2017 FINDINGS: Hyperinflated lungs with bronchitic changes. No acute consolidation or effusion. Normal heart size. Aortic atherosclerosis. No pneumothorax. Emphysematous disease. IMPRESSION: Hyperinflation with emphysematous  disease and bronchitic changes. No focal airspace disease Electronically Signed   By: Jasmine PangKim  Fujinaga M.D.   On: 03/16/2019 17:34    ____________________________________________   PROCEDURES  Procedure(s) performed (including Critical Care):  Procedures   ____________________________________________   INITIAL IMPRESSION / ASSESSMENT AND PLAN / ED COURSE  As part of my medical decision making, I reviewed the following data within the electronic MEDICAL RECORD NUMBER Nursing notes reviewed and incorporated, Labs reviewed, EKG interpreted, Old chart reviewed, Radiograph reviewed, Discussed with admitting physician and Notes from prior ED visits     [name] was evaluated in Emergency Department on 03/17/2019 for the symptoms described in the history of present illness. She was evaluated in the context of the global COVID-19 pandemic, which necessitated consideration that the patient might be at risk for infection with the SARS-CoV-2 virus that causes COVID-19. Institutional protocols and algorithms that pertain to the evaluation of patients at risk for COVID-19 are in a state of rapid change based on information released by regulatory bodies including the CDC and federal and state organizations. These policies and algorithms were  followed during the patient's care in the ED.    73 year old female who presents for confusion.  Just seen in the ED 1 day ago for same.  Recent hospitalization for confusion secondary to hyponatremia and UTI.  Differential diagnosis includes but is not limited to ICH, CVA, TIA, hypoglycemia, infectious, metabolic etiologies, etc.  Will obtain septic and toxicological work-up, CT head.  Rectal temperature, initiate IV fluids.  Anticipate hospitalization.  Clinical Course as of Mar 17 651  Fri Mar 17, 2019  [id number] I personally reviewed patient's chart; this is patient's fourth ED visit since 10/22.  She was admitted on 10/24 for confusion secondary to hyponatremia and UTI.   [JS]  [phone] Looks like patient tested Covid negative on 10/29.  Will cancel repeat swab.   [JS]  [phone] Looks like patient had a psychiatry consult while in the hospital last week.  Seroquel 25 mg nightly was started.  Thyroid medicines were also adjusted for hypothyroidism.  Will check TSH and free T4.  If medical work-up is unremarkable, consider psychiatric consultation.   [JS]  [id number] Patient resting in no acute distress.  Occasional outbursts but is able to be verbally redirected.  Care will be transferred to the oncoming provider.   [JS]    Clinical Course User Index [JS] Irean HongSung, Imad Shostak J, MD     ____________________________________________   FINAL CLINICAL IMPRESSION(S) / ED DIAGNOSES  Final diagnoses:  Confusion     ED Discharge Orders    None       Note:  This document was prepared using Dragon voice recognition software and may include unintentional dictation errors.   Irean HongSung, Jenea Dake J, MD 03/17/19 [phone]

## 2019-03-17 NOTE — ED Notes (Addendum)
Pt assisted to toilet to void Pt has pressured speech and flight of ideas

## 2019-03-17 NOTE — ED Provider Notes (Signed)
Patient's presentation is similar to prior admission, she did have significant provement after that admission.  I discussed with Dr. Earleen Newport of the hospital service will admit   Danielle Drafts, MD 03/17/19 1239

## 2019-03-17 NOTE — H&P (Signed)
[facility] [place] at [facility]   PATIENT NAME: [name]    MR#:  [id number]  DATE OF BIRTH:  1946/05/06  DATE OF ADMISSION:  03/17/2019  PRIMARY CARE PHYSICIAN: Wilford Corner, PA-C   REQUESTING/REFERRING PHYSICIAN: dr Cyril Loosen  CHIEF COMPLAINT:  confusion, paranoia, shouting and using cuss words.  HISTORY OF PRESENT ILLNESS:  [name]  is a 73 y.o. female with a known history of anxiety, depression, hypertension, PTSD, hypothyroidism comes to the emergency room with confusion. Neighbor found patient outside her house and call 911. Patient remembers taking out the trash may be falling but not sure. She currently is very confused she is shouting out for my name and has been paranoid with significant anxiety and the emergency room.  She was discharged recently with same diagnosis of confusion and UTI finished her course. She currently does not have UTI or any infection source at present.  This changes seemed to be acute. She is seen by Dr. Cindi Carbon psychiatry in the emergency room and recommend medical admission. He does not feel patient needs to go to behavioral medicine.  I'm unable to get any history from patient. There is no family member in the ER. She has very suspicious and paranoid behavior along with shouting in the emergency room.  PAST MEDICAL HISTORY:   Past Medical History:  Diagnosis Date  . Anxiety   . Chest pain   . CHF (congestive heart failure) (HCC)   . Depression   . Diabetes mellitus without complication (HCC)   . Fibromyalgia   . Graves disease   . Hypertension   . IBS (irritable bowel syndrome)   . PTSD (post-traumatic stress disorder)   . Sinus problem   . Spinal stenosis     PAST SURGICAL HISTOIRY:   Past Surgical History:  Procedure Laterality Date  . ABDOMINAL HYSTERECTOMY    . ESOPHAGOGASTRODUODENOSCOPY (EGD) WITH PROPOFOL N/A 01/10/2015   Procedure: ESOPHAGOGASTRODUODENOSCOPY (EGD) WITH PROPOFOL;   Surgeon: Wallace Cullens, MD;  Location: [facility] ENDOSCOPY;  Service: Gastroenterology;  Laterality: N/A;  . RECTAL SURGERY    . SAVORY DILATION N/A 01/10/2015   Procedure: SAVORY DILATION;  Surgeon: Wallace Cullens, MD;  Location: [facility] ENDOSCOPY;  Service: Gastroenterology;  Laterality: N/A;  . toenail removal Bilateral    great toes  . TONSILLECTOMY AND ADENOIDECTOMY      SOCIAL HISTORY:   Social History   Tobacco Use  . Smoking status: Former Smoker    Packs/day: 0.50    Years: 25.00    Pack years: 12.50    Types: Cigarettes    Quit date: 06/03/2016    Years since quitting: 2.7  . Smokeless tobacco: Never Used  Substance Use Topics  . Alcohol use: No    Alcohol/week: 0.0 standard drinks    FAMILY HISTORY:   Family History  Problem Relation Age of Onset  . Alcohol abuse Mother   . Depression Mother   . Varicose Veins Mother   . Alcohol abuse Father   . Depression Father   . Alcohol abuse Sister   . Asthma Sister   . COPD Sister   . Depression Sister   . Heart disease Sister   . Hypertension Sister   . Depression Maternal Grandmother   . Diabetes Paternal Grandmother   . Stroke Paternal Grandmother     DRUG ALLERGIES:   Allergies  Allergen Reactions  . Celecoxib     Other reaction(s): Other (qualifier value) Patient is allergic to  Celexia and found that it caused heart failure. CHF  . Pregabalin     Other reaction(s): Localized superficial swelling of skin    REVIEW OF SYSTEMS:  Review of Systems  Unable to perform ROS: Mental status change     MEDICATIONS AT HOME:   Prior to Admission medications   Medication Sig Start Date End Date Taking? Authorizing Provider  albuterol (PROVENTIL HFA;VENTOLIN HFA) 108 (90 Base) MCG/ACT inhaler Inhale 2 puffs into the lungs every 6 (six) hours as needed for wheezing or shortness of breath. 01/04/17  Yes Epifanio Lesches, MD  alendronate (FOSAMAX) 70 MG tablet Take 70 mg by mouth once a week. 01/11/19  Yes [provider]  aspirin 81 MG tablet Take 81 mg by mouth daily.   Yes [provider]  busPIRone (BUSPAR) 7.5 MG tablet Take 7.5 mg by mouth 2 (two) times daily.   Yes [provider]  calcium-vitamin D (CALCIUM 500/D) 500-200 MG-UNIT tablet Take 1 tablet by mouth daily.   Yes [provider]  FLUoxetine (PROZAC) 40 MG capsule Take 40 mg by mouth daily.    Yes [provider]  fluticasone (FLONASE) 50 MCG/ACT nasal spray 2 sprays daily. 03/13/19  Yes [provider]  hydrocortisone (ANUSOL-HC) 2.5 % rectal cream Place 1 application rectally 3 (three) times daily as needed for hemorrhoids or itching. Reported on 12/02/2015   Yes [provider]  levothyroxine (SYNTHROID) 100 MCG tablet Take 1 tablet (100 mcg total) by mouth daily before breakfast. 03/15/19  Yes Fritzi Mandes, MD  LORazepam (ATIVAN) 0.5 MG tablet Take 0.5 mg by mouth at bedtime.   Yes [provider]  lubiprostone (AMITIZA) 8 MCG capsule Take 8 mcg by mouth 2 (two) times daily with a meal.   Yes [provider]  Melatonin 3 MG TABS Take 3 mg by mouth at bedtime.   Yes [provider]  metFORMIN (GLUCOPHAGE) 500 MG tablet Take 500 mg by mouth daily with breakfast.    Yes [provider]  ondansetron (ZOFRAN ODT) 4 MG disintegrating tablet Take 1 tablet (4 mg total) by mouth every 8 (eight) hours as needed for nausea or vomiting. 03/09/19  Yes Earleen Newport, MD  Potassium 99 MG TABS Take 1 tablet by mouth daily.   Yes [provider]  simvastatin (ZOCOR) 20 MG tablet Take 20 mg by mouth daily at 6 PM.   Yes [provider]  SYMBICORT 160-4.5 MCG/ACT inhaler Inhale 2 puffs into the lungs 2 (two) times daily. 02/20/19  Yes [provider]  tiotropium (SPIRIVA) 18 MCG inhalation capsule Place 18 mcg into inhaler and inhale daily. Reported on 08/07/2015   Yes [provider]      VITAL SIGNS:  Blood pressure  (!) 179/91, pulse 91, temperature (!) 97.5 F (36.4 C), temperature source Oral, resp. rate 20, weight 50.8 kg, SpO2 99 %.  PHYSICAL EXAMINATION:  GENERAL:  73 y.o.-year-old patient lying in the bed with no acute distress. Very confused anxious and paranoid EYES: Pupils equal, round, reactive to light and accommodation. No scleral icterus. Extraocular muscles intact.  HEENT: Head atraumatic, normocephalic. Oropharynx and nasopharynx clear.  NECK:  Supple, no jugular venous distention. No thyroid enlargement, no tenderness.  LUNGS: Normal breath sounds bilaterally, no wheezing, rales,rhonchi or crepitation. No use of accessory muscles of respiration.  CARDIOVASCULAR: S1, S2 normal. No murmurs, rubs, or gallops.  ABDOMEN: Soft, nontender, nondistended. Bowel sounds present. No organomegaly or mass.  EXTREMITIES: No pedal  edema, cyanosis, or clubbing.  NEUROLOGIC: Cranial nerves II through XII are intact. Muscle strength 5/5 in all extremities. Sensation intact. Gait not checked.  PSYCHIATRIC: The patient is alert and but confused anxious and paranoid SKIN: No obvious rash, lesion, or ulcer.   LABORATORY PANEL:   CBC Recent Labs  Lab 03/17/19 0516  WBC 7.4  HGB 14.3  HCT 42.0  PLT 215   ------------------------------------------------------------------------------------------------------------------  Chemistries  Recent Labs  Lab 03/17/19 0516  NA 131*  K 4.0  CL 95*  CO2 25  GLUCOSE 110*  BUN 10  CREATININE 0.63  CALCIUM 9.2  AST 28  ALT 13  ALKPHOS 51  BILITOT 0.7   ------------------------------------------------------------------------------------------------------------------  Cardiac Enzymes No results for input(s): TROPONINI in the last 168 hours. ------------------------------------------------------------------------------------------------------------------  RADIOLOGY:  Dg Chest 1 View  Result Date: 03/17/2019 CLINICAL DATA:  Confusion. EXAM: CHEST  1  VIEW COMPARISON:  03/16/2019. FINDINGS: EKG leads noted over the chest. Mediastinum and hilar structures normal. COPD. Mild bibasilar subsegmental atelectasis. No pleural effusion or pneumothorax. Heart size normal. No acute bony abnormality identified. Degenerative changes scoliosis thoracic spine. Carotid vascular calcification. IMPRESSION: 1.  COPD.  Mild bibasilar subsegmental atelectasis. 2.  Carotid vascular disease. Electronically Signed   By: Maisie Fus  Register   On: 03/17/2019 06:02   Ct Head Wo Contrast  Result Date: 03/17/2019 CLINICAL DATA:  Mental status change. EXAM: CT HEAD WITHOUT CONTRAST TECHNIQUE: Contiguous axial images were obtained from the base of the skull through the vertex without intravenous contrast. COMPARISON:  Head CT 03/09/2019 FINDINGS: Brain: The ventricles are in the midline without mass effect or shift. No extra-axial fluid collections are identified. The gray-white differentiation is maintained. No CT findings for acute intracranial process such as hemispheric infarction or intracranial hemorrhage. No mass lesions. The brainstem and cerebellum appear normal in stable. Vascular: No hyperdense vessels or aneurysm. Stable vascular calcifications. Skull: No skull fracture or bone lesion. Hyperostosis frontalis interna. Sinuses/Orbits: The paranasal sinuses and mastoid air cells are clear. The globes are intact. Other: No scalp lesions or hematoma. IMPRESSION: No acute intracranial findings or mass lesion. Electronically Signed   By: Rudie Meyer M.D.   On: 03/17/2019 05:45   Dg Chest Portable 1 View  Result Date: 03/16/2019 CLINICAL DATA:  Confusion and cough EXAM: PORTABLE CHEST 1 VIEW COMPARISON:  01/12/2017 FINDINGS: Hyperinflated lungs with bronchitic changes. No acute consolidation or effusion. Normal heart size. Aortic atherosclerosis. No pneumothorax. Emphysematous disease. IMPRESSION: Hyperinflation with emphysematous disease and bronchitic changes. No focal airspace  disease Electronically Signed   By: Jasmine Pang M.D.   On: 03/16/2019 17:34    EKG:    IMPRESSION AND PLAN:   Jeimy Bickert  is a 73 y.o. female with a known history of anxiety, depression, hypertension, PTSD, hypothyroidism comes to the emergency room with confusion. Neighbor found patient outside her house and call 911. Patient remembers taking out the trash may be falling but not sure. She currently is very confused she is shouting out for my name and has been paranoid with significant anxiety and the emergency room.  1. Acute confusion/encephalopathy in a patient with anxiety, chronic depression -patient was recently admitted with UTI complete was discharged with antibiotic.  -She has significant paranoid/psychotic behavior currently in the ER. She is very confused. Has significant paranoia and suspicious about everyone in the ER. She has been yelling and screaming intermittently also -evaluated by psychiatry Dr. Cindi Carbon-- recommendsinpatient behavior admission, agrees with MRI of the brain and possible use  antipsychotic to help patient out. -Patient likely has underlying dementia as well -patient does not have UTI. No source of infection identified  2. Hypothyroidism -her Synthroid has been increased 100 g  3. Diabetes mellitus continue sliding scale insulin  4. DVT Prophylaxis subcu Lovenox  5. COPD stable  I will try to touch Base with patient's daughter listed in the contact info.   All the records are reviewed and case discussed with ED provider.   CODE STATUS: Full  TOTAL TIME TAKING CARE OF THIS PATIENT: *50* minutes.    Enedina FinnerSona Danikah Budzik M.D on 03/17/2019 at 3:07 PM  Between 7am to 6pm - Pager - [phone]  After 6pm go to [email or web address] - password [email or web address] [facility]  [facility]  Office  [phone]  CC: Primary care physician; Whitaker, CSX CorporationJason Hestle, PA-C

## 2019-03-17 NOTE — ED Notes (Signed)
Pt assisted to toilet at this time. Still very confused and talking a lot.

## 2019-03-17 NOTE — ED Notes (Signed)
Report given to Lorrie RN 

## 2019-03-17 NOTE — ED Notes (Signed)
Pt sitter came to visit pt

## 2019-03-17 NOTE — ED Notes (Signed)
Attempted to call report, unable to do so at this time 

## 2019-03-17 NOTE — ED Notes (Signed)
Pt to CT scan.

## 2019-03-17 NOTE — ED Notes (Signed)
Pt asked to calm down after yelling out at other pts and medical staff.

## 2019-03-17 NOTE — ED Notes (Signed)
Pt refused rectal temp.

## 2019-03-17 NOTE — Consult Note (Signed)
[facility] Consult   Reason for Consult: Altered mental status Referring Physician: Dr. Corky Downs Patient Identification: [name] MRN:  [id number] Principal Diagnosis: <principal problem not specified> Diagnosis:  Active Problems:   Confusion   Total Time spent with patient: 45 minutes  Subjective:   [name] is a 73 y.o. female patient admitted with AMS.  HPI: HPI is limited by patient's clinical status.  Patient is unable to provide any meaningful information.  States that she does see Dr. Morene Antu on an outpatient basis at [facility].  Denies any suicidal or homicidal ideation.  Is unable to reasonably or logically explain the events of today.  Is unable to state where she is or how she got here.  Patient is seen intermittently screaming nonsensical terms such as "broccoli" as well as reading off the discharge sign next to her bed.  When asked if patient is hallucinating patient states that she sees bugs all over the place including on her blanket and on the wall.  Patient is unable to state if she is hearing auditory hallucinations at this time.   Chart review was performed for this patient, it shows that patient was recently admitted for similar change in mental status during which she was found to have hypothyroidism with a TSH of 12.5 as well as a UTI.  Psychiatry was consulted at that time.  According to the last note from this psychiatrist patient's dose of Prozac was 20 mg.     Past Psychiatric History: Patient has a history of anxiety and depression has a home regimen including Prozac and BuSpar.  Recently during his last hospital admission Seroquel was added  Risk to Self:  Yes Risk to Others:  No Prior Inpatient Therapy:  No Prior Outpatient Therapy:  Yes  Past Medical History:  Past Medical History:  Diagnosis Date  . Anxiety   . Chest pain   . CHF (congestive heart failure) ([place])   . Depression   . Diabetes mellitus without complication ([place])   .  Fibromyalgia   . Graves disease   . Hypertension   . IBS (irritable bowel syndrome)   . PTSD (post-traumatic stress disorder)   . Sinus problem   . Spinal stenosis     Past Surgical History:  Procedure Laterality Date  . ABDOMINAL HYSTERECTOMY    . ESOPHAGOGASTRODUODENOSCOPY (EGD) WITH PROPOFOL N/A 01/10/2015   Procedure: ESOPHAGOGASTRODUODENOSCOPY (EGD) WITH PROPOFOL;  Surgeon: Hulen Luster, MD;  Location: [facility] ENDOSCOPY;  Service: Gastroenterology;  Laterality: N/A;  . RECTAL SURGERY    . SAVORY DILATION N/A 01/10/2015   Procedure: SAVORY DILATION;  Surgeon: Hulen Luster, MD;  Location: [facility] ENDOSCOPY;  Service: Gastroenterology;  Laterality: N/A;  . toenail removal Bilateral    great toes  . TONSILLECTOMY AND ADENOIDECTOMY     Family History:  Family History  Problem Relation Age of Onset  . Alcohol abuse Mother   . Depression Mother   . Varicose Veins Mother   . Alcohol abuse Father   . Depression Father   . Alcohol abuse Sister   . Asthma Sister   . COPD Sister   . Depression Sister   . Heart disease Sister   . Hypertension Sister   . Depression Maternal Grandmother   . Diabetes Paternal Grandmother   . Stroke Paternal Grandmother    Family Psychiatric  History: Unknown Social History:  Social History   Substance and Sexual Activity  Alcohol Use No  . Alcohol/week: 0.0 standard drinks  Social History   Substance and Sexual Activity  Drug Use No    Social History   Socioeconomic History  . Marital status: Widowed    Spouse name: Not on file  . Number of children: Not on file  . Years of education: Not on file  . Highest education level: Not on file  Occupational History  . Not on file  Social Needs  . Financial resource strain: Not on file  . Food insecurity    Worry: Not on file    Inability: Not on file  . Transportation needs    Medical: Not on file    Non-medical: Not on file  Tobacco Use  . Smoking status: Former Smoker    Packs/day: 0.50     Years: 25.00    Pack years: 12.50    Types: Cigarettes    Quit date: 06/03/2016    Years since quitting: 2.7  . Smokeless tobacco: Never Used  Substance and Sexual Activity  . Alcohol use: No    Alcohol/week: 0.0 standard drinks  . Drug use: No  . Sexual activity: Never  Lifestyle  . Physical activity    Days per week: Not on file    Minutes per session: Not on file  . Stress: Not on file  Relationships  . Social Musician on phone: Not on file    Gets together: Not on file    Attends religious service: Not on file    Active member of club or organization: Not on file    Attends meetings of clubs or organizations: Not on file    Relationship status: Not on file  Other Topics Concern  . Not on file  Social History Narrative  . Not on file   Additional Social History:    Allergies:   Allergies  Allergen Reactions  . Celecoxib     Other reaction(s): Other (qualifier value) Patient is allergic to [facility] and found that it caused heart failure. CHF  . Pregabalin     Other reaction(s): Localized superficial swelling of skin    Labs:  Results for orders placed or performed during the hospital encounter of 03/17/19 (from the past 48 hour(s))  CBC with Differential     Status: None   Collection Time: 03/17/19  5:16 AM  Result Value Ref Range   WBC 7.4 4.0 - 10.5 K/uL   RBC 4.47 3.87 - 5.11 MIL/uL   Hemoglobin 14.3 12.0 - 15.0 g/dL   HCT 56.4 33.2 - 95.1 %   MCV 94.0 80.0 - 100.0 fL   MCH 32.0 26.0 - 34.0 pg   MCHC 34.0 30.0 - 36.0 g/dL   RDW 88.4 16.6 - 06.3 %   Platelets 215 150 - 400 K/uL   nRBC 0.0 0.0 - 0.2 %   Neutrophils Relative % 63 %   Neutro Abs 4.7 1.7 - 7.7 K/uL   Lymphocytes Relative 20 %   Lymphs Abs 1.5 0.7 - 4.0 K/uL   Monocytes Relative 12 %   Monocytes Absolute 0.9 0.1 - 1.0 K/uL   Eosinophils Relative 3 %   Eosinophils Absolute 0.2 0.0 - 0.5 K/uL   Basophils Relative 1 %   Basophils Absolute 0.1 0.0 - 0.1 K/uL   Immature  Granulocytes 1 %   Abs Immature Granulocytes 0.06 0.00 - 0.07 K/uL    Comment: Performed at [facility], [address], [place], [place] [address]  Comprehensive metabolic panel     Status: Abnormal  Collection Time: 03/17/19  5:16 AM  Result Value Ref Range   Sodium 131 (L) 135 - 145 mmol/L   Potassium 4.0 3.5 - 5.1 mmol/L   Chloride 95 (L) 98 - 111 mmol/L   CO2 25 22 - 32 mmol/L   Glucose, Bld 110 (H) 70 - 99 mg/dL   BUN 10 8 - 23 mg/dL   Creatinine, Ser 1.30 0.44 - 1.00 mg/dL   Calcium 9.2 8.9 - 86.5 mg/dL   Total Protein 7.4 6.5 - 8.1 g/dL   Albumin 4.6 3.5 - 5.0 g/dL   AST 28 15 - 41 U/L   ALT 13 0 - 44 U/L   Alkaline Phosphatase 51 38 - 126 U/L   Total Bilirubin 0.7 0.3 - 1.2 mg/dL   GFR calc non Af Amer >60 >60 mL/min   GFR calc Af Amer >60 >60 mL/min   Anion gap 11 5 - 15    Comment: Performed at [facility], [address] [place], [place] [address]  Lactic acid, plasma     Status: None   Collection Time: 03/17/19  5:16 AM  Result Value Ref Range   Lactic Acid, Venous 1.4 0.5 - 1.9 mmol/L    Comment: Performed at [facility], [address] [place], [place] [address]  Lactic acid, plasma     Status: None   Collection Time: 03/17/19  6:04 AM  Result Value Ref Range   Lactic Acid, Venous 1.1 0.5 - 1.9 mmol/L    Comment: Performed at [facility], [address] [place], [place] [address]  Ammonia     Status: None   Collection Time: 03/17/19  6:04 AM  Result Value Ref Range   Ammonia 13 9 - 35 umol/L    Comment: Performed at [facility], [address] [place], [place] [address]  Urinalysis, Complete w Microscopic     Status: Abnormal   Collection Time: 03/17/19  6:04 AM  Result Value Ref Range   Color, Urine STRAW (A) YELLOW   APPearance CLEAR (A) CLEAR   Specific Gravity, Urine 1.005 1.005 - 1.030   pH 6.0 5.0 - 8.0   Glucose, UA NEGATIVE NEGATIVE mg/dL   Hgb urine dipstick SMALL (A) NEGATIVE    Bilirubin Urine NEGATIVE NEGATIVE   Ketones, ur 20 (A) NEGATIVE mg/dL   Protein, ur NEGATIVE NEGATIVE mg/dL   Nitrite NEGATIVE NEGATIVE   Leukocytes,Ua MODERATE (A) NEGATIVE   RBC / HPF 0-5 0 - 5 RBC/hpf   WBC, UA 6-10 0 - 5 WBC/hpf   Bacteria, UA RARE (A) NONE SEEN   Squamous Epithelial / LPF 0-5 0 - 5    Comment: Performed at [facility], [address] [place], [place] [address]  Urine Drug Screen, Qualitative     Status: None   Collection Time: 03/17/19  6:04 AM  Result Value Ref Range   Tricyclic, Ur Screen NONE DETECTED NONE DETECTED   Amphetamines, Ur Screen NONE DETECTED NONE DETECTED   MDMA (Ecstasy)Ur Screen NONE DETECTED NONE DETECTED   Cocaine Metabolite,Ur [place] NONE DETECTED NONE DETECTED   Opiate, Ur Screen NONE DETECTED NONE DETECTED   Phencyclidine (PCP) Ur S NONE DETECTED NONE DETECTED   Cannabinoid 50 Ng, Ur  [place] NONE DETECTED NONE DETECTED   Barbiturates, Ur Screen NONE DETECTED NONE DETECTED   Benzodiazepine, Ur Scrn NONE DETECTED NONE DETECTED   Methadone Scn, Ur NONE DETECTED NONE DETECTED    Comment: (NOTE) Tricyclics + metabolites, urine  Cutoff 1000 ng/mL Amphetamines + metabolites, urine  Cutoff 1000 ng/mL MDMA (Ecstasy), urine              Cutoff 500 ng/mL Cocaine Metabolite, urine          Cutoff 300 ng/mL Opiate + metabolites, urine        Cutoff 300 ng/mL Phencyclidine (PCP), urine         Cutoff 25 ng/mL Cannabinoid, urine                 Cutoff 50 ng/mL Barbiturates + metabolites, urine  Cutoff 200 ng/mL Benzodiazepine, urine              Cutoff 200 ng/mL Methadone, urine                   Cutoff 300 ng/mL The urine drug screen provides only a preliminary, unconfirmed analytical test result and should not be used for non-medical purposes. Clinical consideration and professional judgment should be applied to any positive drug screen result due to possible interfering substances. A more specific alternate chemical method must be used in  order to obtain a confirmed analytical result. Gas chromatography / mass spectrometry (GC/MS) is the preferred confirmat ory method. Performed at [facility], [address] [place], [place] [address]   CK     Status: None   Collection Time: 03/17/19  6:04 AM  Result Value Ref Range   Total CK 82 38 - 234 U/L    Comment: Performed at [facility], [address] [place], [place] [address]  Acetaminophen level     Status: Abnormal   Collection Time: 03/17/19  6:30 AM  Result Value Ref Range   Acetaminophen (Tylenol), Serum <10 (L) 10 - 30 ug/mL    Comment: (NOTE) Therapeutic concentrations vary significantly. A range of 10-30 ug/mL  may be an effective concentration for many patients. However, some  are best treated at concentrations outside of this range. Acetaminophen concentrations >150 ug/mL at 4 hours after ingestion  and >50 ug/mL at 12 hours after ingestion are often associated with  toxic reactions. Performed at [facility], [address] [place], [place] [address]   Ethanol     Status: None   Collection Time: 03/17/19  6:30 AM  Result Value Ref Range   Alcohol, Ethyl (B) <10 <10 mg/dL    Comment: (NOTE) Lowest detectable limit for serum alcohol is 10 mg/dL. For medical purposes only. Performed at [facility], [address] [place], [place] [address]   Salicylate level     Status: None   Collection Time: 03/17/19  6:30 AM  Result Value Ref Range   Salicylate Lvl <7.0 2.8 - 30.0 mg/dL    Comment: Performed at [facility], [address] [place], [place] [address]  TSH     Status: Abnormal   Collection Time: 03/17/19  6:30 AM  Result Value Ref Range   TSH 17.226 (H) 0.350 - 4.500 uIU/mL    Comment: Performed by a 3rd Generation assay with a functional sensitivity of <=0.01 uIU/mL. Performed at [facility], [address] [place], [place] [address]   T4, free     Status: None   Collection  Time: 03/17/19  6:30 AM  Result Value Ref Range   Free T4 0.66 0.61 - 1.12 ng/dL    Comment: (NOTE) Biotin ingestion may interfere with free T4 tests. If the results are inconsistent with the TSH level, previous test  results, or the clinical presentation, then consider biotin interference. If needed, order repeat testing after stopping biotin. Performed at [facility], [address] [place], [place] [address]     No current facility-administered medications for this encounter.    Current Outpatient Medications  Medication Sig Dispense Refill  . albuterol (PROVENTIL HFA;VENTOLIN HFA) 108 (90 Base) MCG/ACT inhaler Inhale 2 puffs into the lungs every 6 (six) hours as needed for wheezing or shortness of breath. 1 Inhaler 2  . alendronate (FOSAMAX) 70 MG tablet Take 70 mg by mouth once a week.    Marland Kitchen aspirin 81 MG tablet Take 81 mg by mouth daily.    . busPIRone (BUSPAR) 7.5 MG tablet Take 7.5 mg by mouth 2 (two) times daily.    . calcium-vitamin D (CALCIUM 500/D) 500-200 MG-UNIT tablet Take 1 tablet by mouth daily.    Marland Kitchen FLUoxetine (PROZAC) 40 MG capsule Take 40 mg by mouth daily.     . fluticasone (FLONASE) 50 MCG/ACT nasal spray 2 sprays daily.    . hydrocortisone (ANUSOL-HC) 2.5 % rectal cream Place 1 application rectally 3 (three) times daily as needed for hemorrhoids or itching. Reported on 12/02/2015    . levothyroxine (SYNTHROID) 100 MCG tablet Take 1 tablet (100 mcg total) by mouth daily before breakfast. 30 tablet 0  . LORazepam (ATIVAN) 0.5 MG tablet Take 0.5 mg by mouth at bedtime.    Marland Kitchen lubiprostone (AMITIZA) 8 MCG capsule Take 8 mcg by mouth 2 (two) times daily with a meal.    . Melatonin 3 MG TABS Take 3 mg by mouth at bedtime.    . metFORMIN (GLUCOPHAGE) 500 MG tablet Take 500 mg by mouth daily with breakfast.     . ondansetron (ZOFRAN ODT) 4 MG disintegrating tablet Take 1 tablet (4 mg total) by mouth every 8 (eight) hours as needed for nausea or vomiting. 20 tablet  0  . Potassium 99 MG TABS Take 1 tablet by mouth daily.    . simvastatin (ZOCOR) 20 MG tablet Take 20 mg by mouth daily at 6 PM.    . SYMBICORT 160-4.5 MCG/ACT inhaler Inhale 2 puffs into the lungs 2 (two) times daily.    Marland Kitchen tiotropium (SPIRIVA) 18 MCG inhalation capsule Place 18 mcg into inhaler and inhale daily. Reported on 08/07/2015      Musculoskeletal: Strength & Muscle Tone: within normal limits Gait & Station: unable to stand Patient leans: N/A  Psychiatric Specialty Exam: Physical Exam  ROS unable to be performed due to AMS  Blood pressure (!) 157/83, pulse 88, temperature (!) 97.5 F (36.4 C), temperature source Oral, resp. rate 18, weight 50.8 kg, SpO2 97 %.Body mass index is 18.08 kg/m.  General Appearance: Bizarre  Eye Contact:  Fair  Speech:  Slow  Volume:  Increased  Mood:  Anxious  Affect:  Inappropriate and Labile  Thought Process:  Disorganized and Irrelevant  Orientation:  Negative  Thought Content:  Illogical, Hallucinations: Visual and Tangential  Suicidal Thoughts:  No  Homicidal Thoughts:  No  Memory:  Immediate;   Poor  Judgement:  Impaired  Insight:  Lacking  Psychomotor Activity:  Restlessness  Concentration:  Concentration: Poor  Recall:  Poor  Fund of Knowledge:  Fair  Language:  Fair  Akathisia:  No  Handed:  Right  AIMS (if indicated):     Assets:  Resilience Social Support  ADL's:  Impaired  Cognition:  Impaired,  Severe  Sleep:       Assessment:  73 year old patient who presented with AMS.  Patient had a recent hospitalization for similar symptoms and was found to have a high TSH along with a UTI.  At this current presentation patient no longer has a UTI but did undergo recent changes in her thyroid medication.  TSH on last admission was 12.4, today TSH is 17.2.  Differential of this psychosis includes a direct manifestation of overt hypothyroidism as well as a  myxedema coma, or Hashimoto's encephalopathy.  From a psychiatric  perspective the effects of hypothyroidism are complicated and can include a slowing of metabolic processes including the clearance of drugs.  It is possible that patient's clearance of her psychiatric medications was hindered by her recent hypothyroidism and she is now experiencing a substance-induced psychosis from her SSRI medication.  It is also possible that patient has taken too much of her SSRI medication due to to her previous confusion causing a similar picture.  Diagnosis: Diagnosis remains unclear, AMS  Recommendations:  Hold Prozac Hold BuSpar Consider endocrinology consult If necessary treat psychosis with Seroquel 25 mg p.o. as needed every 4 hours     Treatment Plan Summary: Daily contact with patient to assess and evaluate symptoms and progress in treatment  Disposition: Patient does not meet criteria for psychiatric inpatient admission.  She is deemed to medically ill at this time  Clement Sayres, MD 03/17/2019 6:04 PM

## 2019-03-17 NOTE — ED Triage Notes (Signed)
Pt brought in by EMS, was found by neighbor in her yard. Pt was confused and co pain to head.

## 2019-03-17 NOTE — ED Notes (Signed)
Pt given warm blanket.

## 2019-03-18 ENCOUNTER — Observation Stay: Payer: Medicare HMO

## 2019-03-18 DIAGNOSIS — E119 Type 2 diabetes mellitus without complications: Secondary | ICD-10-CM | POA: Diagnosis not present

## 2019-03-18 DIAGNOSIS — R69 Illness, unspecified: Secondary | ICD-10-CM | POA: Diagnosis not present

## 2019-03-18 DIAGNOSIS — I10 Essential (primary) hypertension: Secondary | ICD-10-CM | POA: Diagnosis not present

## 2019-03-18 DIAGNOSIS — E039 Hypothyroidism, unspecified: Secondary | ICD-10-CM | POA: Diagnosis not present

## 2019-03-18 DIAGNOSIS — G9340 Encephalopathy, unspecified: Secondary | ICD-10-CM | POA: Diagnosis not present

## 2019-03-18 LAB — SARS CORONAVIRUS 2 (TAT 6-24 HRS): SARS Coronavirus 2: NEGATIVE

## 2019-03-18 NOTE — Progress Notes (Signed)
C/o headache. Took Tylenol in and showed her the package, scanned the pills and opened the pills. Patient refused to take saying she doesn't trust anything, and she doesn't trust the numbers on the white board. Difficulty redirecting patient. Oriented to place and time but paranoid.

## 2019-03-18 NOTE — Progress Notes (Signed)
[employer] - [place] at [facility]   PATIENT NAME: [name]    MR#:  [id number]  DATE OF BIRTH:  1946/04/17  SUBJECTIVE:  CHIEF COMPLAINT:   Chief Complaint  Patient presents with  . Altered Mental Status   The patient looks better, still confused. REVIEW OF SYSTEMS:  Review of Systems  Unable to perform ROS: Medical condition    DRUG ALLERGIES:   Allergies  Allergen Reactions  . Celecoxib     Other reaction(s): Other (qualifier value) Patient is allergic to [facility] and found that it caused heart failure. CHF  . Pregabalin     Other reaction(s): Localized superficial swelling of skin   VITALS:  Blood pressure 107/67, pulse 77, temperature 98.2 F (36.8 C), temperature source Oral, resp. rate 18, height 5\' 5"  (1.651 m), weight 50.5 kg, SpO2 97 %. PHYSICAL EXAMINATION:  Physical Exam Constitutional:      General: She is not in acute distress. HENT:     Head: Normocephalic.  Eyes:     General: No scleral icterus.    Conjunctiva/sclera: Conjunctivae normal.     Pupils: Pupils are equal, round, and reactive to light.  Neck:     Musculoskeletal: Normal range of motion and neck supple.     Vascular: No JVD.     Trachea: No tracheal deviation.  Cardiovascular:     Rate and Rhythm: Normal rate and regular rhythm.     Heart sounds: Normal heart sounds. No murmur. No gallop.   Pulmonary:     Effort: Pulmonary effort is normal. No respiratory distress.     Breath sounds: Normal breath sounds. No wheezing or rales.  Abdominal:     General: Bowel sounds are normal. There is no distension.     Palpations: Abdomen is soft.     Tenderness: There is no abdominal tenderness. There is no rebound.  Musculoskeletal: Normal range of motion.        General: No tenderness.     Right lower leg: No edema.     Left lower leg: No edema.  Skin:    Findings: No erythema or rash.  Neurological:     Mental Status: She is alert.     Cranial Nerves: No cranial  nerve deficit.  Psychiatric:     Comments: AAOx2    LABORATORY PANEL:  Female CBC Recent Labs  Lab 03/17/19 0516  WBC 7.4  HGB 14.3  HCT 42.0  PLT 215   ------------------------------------------------------------------------------------------------------------------ Chemistries  Recent Labs  Lab 03/17/19 0516  NA 131*  K 4.0  CL 95*  CO2 25  GLUCOSE 110*  BUN 10  CREATININE 0.63  CALCIUM 9.2  AST 28  ALT 13  ALKPHOS 51  BILITOT 0.7   RADIOLOGY:  Mr Brain Wo Contrast  Result Date: 03/18/2019 CLINICAL DATA:  Encephalopathy EXAM: MRI HEAD WITHOUT CONTRAST TECHNIQUE: Multiplanar, multiecho pulse sequences of the brain and surrounding structures were obtained without intravenous contrast. COMPARISON:  None. FINDINGS: BRAIN: There is no acute infarct, acute hemorrhage or extra-axial collection. The white matter signal is normal for the patient's age. The cerebral and cerebellar volume are age-appropriate. There is no hydrocephalus. The midline structures are normal. VASCULAR: The major intracranial arterial and venous sinus flow voids are normal. Susceptibility-sensitive sequences show no chronic microhemorrhage or superficial siderosis. SKULL AND UPPER CERVICAL SPINE: Calvarial bone marrow signal is normal. There is no skull base mass. The visualized upper cervical spine and soft tissues are normal. SINUSES/ORBITS: There are no  fluid levels or advanced mucosal thickening. The mastoid air cells and middle ear cavities are free of fluid. The orbits are normal. IMPRESSION: Normal aging brain. Electronically Signed   By: Ulyses Jarred M.D.   On: 03/18/2019 06:25   Dg Abd Portable 1v  Result Date: 03/17/2019 CLINICAL DATA:  MRI clearance EXAM: PORTABLE ABDOMEN - 1 VIEW COMPARISON:  11/24/2016 pelvic radiograph FINDINGS: Nonobstructed bowel gas pattern with large volume of stool in the colon. Small surgical clips in the left pelvis as before. No metallic foreign bodies are visualized.  IMPRESSION: 1. No significant metallic foreign body or objects over the abdomen or pelvis. 2. Nonobstructed gas pattern with large amount of stool in the colon Electronically Signed   By: Donavan Foil M.D.   On: 03/17/2019 20:53   ASSESSMENT AND PLAN:   [name]  is a 73 y.o. female with a known history of anxiety, depression, hypertension, PTSD, hypothyroidism comes to the emergency room with confusion. Neighbor found patient outside her house and call 911. Patient remembers taking out the trash may be falling but not sure. She currently is very confused she is shouting out for my name and has been paranoid with significant anxiety and the emergency room.  1. Acute confusion/encephalopathy in a patient with anxiety, chronic depression -patient was recently admitted with UTI complete was discharged with antibiotic.  -She had significant paranoid/psychotic behavior and significant paranoia and suspicious about everyone in the ER. -Patient likely has underlying dementia as well -patient does not have UTI. No source of infection identified MRI brain is unremarkable. Per Dr. Claris Gower,  Hold Prozac Hold BuSpar Consider endocrinology consult If necessary treat psychosis with Seroquel 25 mg p.o. as needed every 4 hours.  2. Hypothyroidism -her Synthroid has been increased 100 g  3. Diabetes mellitus continue sliding scale insulin  4. DVT Prophylaxis subcu Lovenox  5. COPD stable  All the records are reviewed and case discussed with Care Management/Social Worker. Management plans discussed with the patient, family and they are in agreement.  CODE STATUS: Full Code  TOTAL TIME TAKING CARE OF THIS PATIENT: 28 minutes.   More than 50% of the time was spent in counseling/coordination of care: YES  POSSIBLE D/C IN 1-2 DAYS, DEPENDING ON CLINICAL CONDITION.   Demetrios Loll M.D on 03/18/2019 at 12:34 PM  Between 7am to 6pm - Pager - [phone]  After 6pm go to [email or web address] -  [employer] [facility]

## 2019-03-19 ENCOUNTER — Encounter: Payer: Self-pay | Admitting: *Deleted

## 2019-03-19 ENCOUNTER — Other Ambulatory Visit: Payer: Self-pay

## 2019-03-19 DIAGNOSIS — J449 Chronic obstructive pulmonary disease, unspecified: Secondary | ICD-10-CM | POA: Diagnosis not present

## 2019-03-19 DIAGNOSIS — G9341 Metabolic encephalopathy: Secondary | ICD-10-CM | POA: Diagnosis not present

## 2019-03-19 DIAGNOSIS — E039 Hypothyroidism, unspecified: Secondary | ICD-10-CM

## 2019-03-19 DIAGNOSIS — I509 Heart failure, unspecified: Secondary | ICD-10-CM | POA: Diagnosis present

## 2019-03-19 DIAGNOSIS — Z7982 Long term (current) use of aspirin: Secondary | ICD-10-CM | POA: Diagnosis not present

## 2019-03-19 DIAGNOSIS — Z79899 Other long term (current) drug therapy: Secondary | ICD-10-CM | POA: Diagnosis not present

## 2019-03-19 DIAGNOSIS — Z87440 Personal history of urinary (tract) infections: Secondary | ICD-10-CM | POA: Diagnosis not present

## 2019-03-19 DIAGNOSIS — E119 Type 2 diabetes mellitus without complications: Secondary | ICD-10-CM | POA: Diagnosis present

## 2019-03-19 DIAGNOSIS — F4310 Post-traumatic stress disorder, unspecified: Secondary | ICD-10-CM | POA: Diagnosis present

## 2019-03-19 DIAGNOSIS — F600 Paranoid personality disorder: Secondary | ICD-10-CM | POA: Diagnosis present

## 2019-03-19 DIAGNOSIS — Z20828 Contact with and (suspected) exposure to other viral communicable diseases: Secondary | ICD-10-CM | POA: Diagnosis present

## 2019-03-19 DIAGNOSIS — Z87891 Personal history of nicotine dependence: Secondary | ICD-10-CM | POA: Diagnosis not present

## 2019-03-19 DIAGNOSIS — Z7951 Long term (current) use of inhaled steroids: Secondary | ICD-10-CM | POA: Diagnosis not present

## 2019-03-19 DIAGNOSIS — M797 Fibromyalgia: Secondary | ICD-10-CM | POA: Diagnosis present

## 2019-03-19 DIAGNOSIS — Z833 Family history of diabetes mellitus: Secondary | ICD-10-CM | POA: Diagnosis not present

## 2019-03-19 DIAGNOSIS — F329 Major depressive disorder, single episode, unspecified: Secondary | ICD-10-CM | POA: Diagnosis present

## 2019-03-19 DIAGNOSIS — E1169 Type 2 diabetes mellitus with other specified complication: Secondary | ICD-10-CM | POA: Diagnosis not present

## 2019-03-19 DIAGNOSIS — Z818 Family history of other mental and behavioral disorders: Secondary | ICD-10-CM | POA: Diagnosis not present

## 2019-03-19 DIAGNOSIS — Z90710 Acquired absence of both cervix and uterus: Secondary | ICD-10-CM | POA: Diagnosis not present

## 2019-03-19 DIAGNOSIS — R69 Illness, unspecified: Secondary | ICD-10-CM | POA: Diagnosis not present

## 2019-03-19 DIAGNOSIS — E785 Hyperlipidemia, unspecified: Secondary | ICD-10-CM

## 2019-03-19 DIAGNOSIS — E871 Hypo-osmolality and hyponatremia: Secondary | ICD-10-CM | POA: Diagnosis present

## 2019-03-19 DIAGNOSIS — Z794 Long term (current) use of insulin: Secondary | ICD-10-CM | POA: Diagnosis not present

## 2019-03-19 DIAGNOSIS — Z79890 Hormone replacement therapy (postmenopausal): Secondary | ICD-10-CM | POA: Diagnosis not present

## 2019-03-19 DIAGNOSIS — Z8249 Family history of ischemic heart disease and other diseases of the circulatory system: Secondary | ICD-10-CM | POA: Diagnosis not present

## 2019-03-19 DIAGNOSIS — R410 Disorientation, unspecified: Secondary | ICD-10-CM | POA: Diagnosis not present

## 2019-03-19 DIAGNOSIS — I110 Hypertensive heart disease with heart failure: Secondary | ICD-10-CM | POA: Diagnosis present

## 2019-03-19 LAB — URINALYSIS, COMPLETE (UACMP) WITH MICROSCOPIC
Bacteria, UA: NONE SEEN
Bilirubin Urine: NEGATIVE
Glucose, UA: NEGATIVE mg/dL
Hgb urine dipstick: NEGATIVE
Ketones, ur: NEGATIVE mg/dL
Nitrite: NEGATIVE
Protein, ur: NEGATIVE mg/dL
Specific Gravity, Urine: 1.016 (ref 1.005–1.030)
pH: 6 (ref 5.0–8.0)

## 2019-03-19 MED ORDER — LEVOTHYROXINE SODIUM 112 MCG PO TABS
112.0000 ug | ORAL_TABLET | Freq: Every day | ORAL | Status: DC
  Administered 2019-03-20 – 2019-03-23 (×4): 112 ug via ORAL
  Filled 2019-03-19 (×4): qty 1

## 2019-03-19 NOTE — Progress Notes (Signed)
Patient ID: [name] Duda, female   DOB: 13-Jul-1945, 73 y.o.   MRN: [id number] [facility] PROGRESS NOTE  [name] Worden [id number] DOB: 13-Jul-1945 DOA: 03/17/2019 PCP: Debbra RidingWhitaker, Jason Hestle, PA-Poole  HPI/Subjective: Patient very difficult to focus.  She states she is doing better.  She knows she is in the hospital.  She has lots of questions.  Physically she feels okay.  Objective: Vitals:   03/19/19 0539 03/19/19 1300  BP: (!) 154/95 (!) 146/70  Pulse: 86 84  Resp: 18 18  Temp: 98.2 F (36.8 Poole) 98.1 F (36.7 Poole)  SpO2: 97% 96%    Intake/Output Summary (Last 24 hours) at 03/19/2019 1645 Last data filed at 03/19/2019 0557 Gross per 24 hour  Intake 100 ml  Output 0 ml  Net 100 ml   Filed Weights   03/17/19 0521 03/17/19 2200  Weight: 50.8 kg 50.5 kg    ROS: Review of Systems  Constitutional: Negative for chills and fever.  Eyes: Negative for blurred vision.  Respiratory: Negative for cough and shortness of breath.   Cardiovascular: Negative for chest pain.  Gastrointestinal: Negative for abdominal pain, constipation, diarrhea, nausea and vomiting.  Genitourinary: Negative for dysuria.  Musculoskeletal: Negative for joint pain.  Neurological: Negative for dizziness and headaches.   Exam: Physical Exam  HENT:  Nose: No mucosal edema.  Mouth/Throat: No oropharyngeal exudate or posterior oropharyngeal edema.  Eyes: Pupils are equal, round, and reactive to light. Conjunctivae, EOM and lids are normal.  Neck: No JVD present. Carotid bruit is not present. No edema present. No thyroid mass and no thyromegaly present.  Cardiovascular: S1 normal and S2 normal. Exam reveals no gallop.  No murmur heard. Pulses:      Dorsalis pedis pulses are 2+ on the right side and 2+ on the left side.  Respiratory: No respiratory distress. She has no wheezes. She has no rhonchi. She has no rales.  GI: Soft. Bowel sounds are normal. There is no abdominal tenderness.  Musculoskeletal:    Right ankle: She exhibits no swelling.     Left ankle: She exhibits no swelling.  Lymphadenopathy:    She has no cervical adenopathy.  Neurological: She is alert. No cranial nerve deficit.  Skin: Skin is warm. No rash noted. Nails show no clubbing.  Psychiatric: Her mood appears anxious. Thought content is paranoid.      Data Reviewed: Basic Metabolic Panel: Recent Labs  Lab 03/13/19 0458 03/16/19 1336 03/17/19 0516  NA 129* 125* 131*  K 4.5 4.3 4.0  CL 96* 90* 95*  CO2 26 22 25   GLUCOSE 93 108* 110*  BUN 14 13 10   CREATININE 0.77 0.64 0.63  CALCIUM 9.3 9.3 9.2   Liver Function Tests: Recent Labs  Lab 03/16/19 1336 03/17/19 0516  AST 23 28  ALT 14 13  ALKPHOS 50 51  BILITOT 0.6 0.7  PROT 7.2 7.4  ALBUMIN 4.4 4.6   No results for input(s): LIPASE, AMYLASE in the last 168 hours. Recent Labs  Lab 03/17/19 0604  AMMONIA 13   CBC: Recent Labs  Lab 03/16/19 1336 03/17/19 0516  WBC 8.4 7.4  NEUTROABS  --  4.7  HGB 14.1 14.3  HCT 41.1 42.0  MCV 92.8 94.0  PLT 241 215   Cardiac Enzymes: Recent Labs  Lab 03/17/19 0604  CKTOTAL 82    CBG: Recent Labs  Lab 03/13/19 1159 03/13/19 1656 03/13/19 2131 03/14/19 0752 03/14/19 1138  GLUCAP 144* 121* 127* 115* 173*    Recent  Results (from the past 240 hour(s))  SARS CORONAVIRUS 2 (TAT 6-24 HRS) Nasopharyngeal Nasopharyngeal Swab     Status: None   Collection Time: 03/11/19  6:09 AM   Specimen: Nasopharyngeal Swab  Result Value Ref Range Status   SARS Coronavirus 2 NEGATIVE NEGATIVE Final    Comment: (NOTE) SARS-CoV-2 target nucleic acids are NOT DETECTED. The SARS-CoV-2 RNA is generally detectable in upper and lower respiratory specimens during the acute phase of infection. Negative results do not preclude SARS-CoV-2 infection, do not rule out co-infections with other pathogens, and should not be used as the sole basis for treatment or other patient management decisions. Negative results must be  combined with clinical observations, patient history, and epidemiological information. The expected result is Negative. Fact Sheet for Patients: HairSlick.no Fact Sheet for Healthcare Providers: quierodirigir.com This test is not yet approved or cleared by the [place] FDA and  has been authorized for detection and/or diagnosis of SARS-CoV-2 by FDA under an Emergency Use Authorization (EUA). This EUA will remain  in effect (meaning this test can be used) for the duration of the COVID-19 declaration under Section 56 4(b)(1) of the Act, 21 U.S.[place]. section 360bbb-3(b)(1), unless the authorization is terminated or revoked sooner. Performed at [facility], [address] [place], [place] [address]   SARS CORONAVIRUS 2 (TAT 6-24 HRS) Nasopharyngeal Nasopharyngeal Swab     Status: None   Collection Time: 03/16/19  5:38 PM   Specimen: Nasopharyngeal Swab  Result Value Ref Range Status   SARS Coronavirus 2 NEGATIVE NEGATIVE Final    Comment: (NOTE) SARS-CoV-2 target nucleic acids are NOT DETECTED. The SARS-CoV-2 RNA is generally detectable in upper and lower respiratory specimens during the acute phase of infection. Negative results do not preclude SARS-CoV-2 infection, do not rule out co-infections with other pathogens, and should not be used as the sole basis for treatment or other patient management decisions. Negative results must be combined with clinical observations, patient history, and epidemiological information. The expected result is Negative. Fact Sheet for Patients: HairSlick.no Fact Sheet for Healthcare Providers: quierodirigir.com This test is not yet approved or cleared by the [place] FDA and  has been authorized for detection and/or diagnosis of SARS-CoV-2 by FDA under an Emergency Use Authorization (EUA). This EUA will remain  in effect (meaning  this test can be used) for the duration of the COVID-19 declaration under Section 56 4(b)(1) of the Act, 21 U.S.[place]. section 360bbb-3(b)(1), unless the authorization is terminated or revoked sooner. Performed at [facility], [address], [place], [place] [address]   SARS CORONAVIRUS 2 (TAT 6-24 HRS) Nasopharyngeal Nasopharyngeal Swab     Status: None   Collection Time: 03/17/19  3:45 PM   Specimen: Nasopharyngeal Swab  Result Value Ref Range Status   SARS Coronavirus 2 NEGATIVE NEGATIVE Final    Comment: (NOTE) SARS-CoV-2 target nucleic acids are NOT DETECTED. The SARS-CoV-2 RNA is generally detectable in upper and lower respiratory specimens during the acute phase of infection. Negative results do not preclude SARS-CoV-2 infection, do not rule out co-infections with other pathogens, and should not be used as the sole basis for treatment or other patient management decisions. Negative results must be combined with clinical observations, patient history, and epidemiological information. The expected result is Negative. Fact Sheet for Patients: HairSlick.no Fact Sheet for Healthcare Providers: quierodirigir.com This test is not yet approved or cleared by the [place] FDA and  has been authorized for detection and/or diagnosis of  SARS-CoV-2 by FDA under an Emergency Use Authorization (EUA). This EUA will remain  in effect (meaning this test can be used) for the duration of the COVID-19 declaration under Section 56 4(b)(1) of the Act, 21 U.S.[place]. section 360bbb-3(b)(1), unless the authorization is terminated or revoked sooner. Performed at [facility], [address], [place], [place] [address]      Studies: Mr Brain 31 Contrast  Result Date: 03/18/2019 CLINICAL DATA:  Encephalopathy EXAM: MRI HEAD WITHOUT CONTRAST TECHNIQUE: Multiplanar, multiecho pulse sequences of the brain and surrounding structures  were obtained without intravenous contrast. COMPARISON:  None. FINDINGS: BRAIN: There is no acute infarct, acute hemorrhage or extra-axial collection. The white matter signal is normal for the patient's age. The cerebral and cerebellar volume are age-appropriate. There is no hydrocephalus. The midline structures are normal. VASCULAR: The major intracranial arterial and venous sinus flow voids are normal. Susceptibility-sensitive sequences show no chronic microhemorrhage or superficial siderosis. SKULL AND UPPER CERVICAL SPINE: Calvarial bone marrow signal is normal. There is no skull base mass. The visualized upper cervical spine and soft tissues are normal. SINUSES/ORBITS: There are no fluid levels or advanced mucosal thickening. The mastoid air cells and middle ear cavities are free of fluid. The orbits are normal. IMPRESSION: Normal aging brain. Electronically Signed   By: Deatra Robinson M.D.   On: 03/18/2019 06:25   Dg Abd Portable 1v  Result Date: 03/17/2019 CLINICAL DATA:  MRI clearance EXAM: PORTABLE ABDOMEN - 1 VIEW COMPARISON:  11/24/2016 pelvic radiograph FINDINGS: Nonobstructed bowel gas pattern with large volume of stool in the colon. Small surgical clips in the left pelvis as before. No metallic foreign bodies are visualized. IMPRESSION: 1. No significant metallic foreign body or objects over the abdomen or pelvis. 2. Nonobstructed gas pattern with large amount of stool in the colon Electronically Signed   By: Jasmine Pang M.D.   On: 03/17/2019 20:53    Scheduled Meds: . aspirin EC  81 mg Oral Daily  . busPIRone  7.5 mg Oral BID  . calcium-vitamin D  1 tablet Oral Daily  . enoxaparin (LOVENOX) injection  40 mg Subcutaneous Q24H  . FLUoxetine  40 mg Oral Daily  . fluticasone  2 spray Each Nare Daily  . [START ON 03/20/2019] levothyroxine  112 mcg Oral QAC breakfast  . LORazepam  0.5 mg Oral QHS  . lubiprostone  8 mcg Oral BID WC  . Melatonin  5 mg Oral QHS  . metFORMIN  500 mg Oral Q  breakfast  . mometasone-formoterol  2 puff Inhalation BID  . senna  1 tablet Oral BID  . simvastatin  20 mg Oral q1800  . tiotropium  18 mcg Inhalation Daily   Continuous Infusions:  Assessment/Plan:  1. Acute metabolic encephalopathy.  Send another urine analysis urine culture.  TSH slightly elevated so I will increase her levothyroxine.  Reconsult psych because the patient just paranoid. 2. Hypothyroidism on Synthroid increased to 112 mcg daily 3. Type 2 diabetes mellitus on Metformin 4. Hyperlipidemia unspecified on simvastatin 5. COPD.  Respiratory status stable continue inhalers  Code Status:     Code Status Orders  (From admission, onward)         Start     Ordered   03/17/19 1832  Full code  Continuous     03/17/19 1831        Code Status History    Date Active Date Inactive Code Status Order ID Comments User Context   03/11/2019 0725  03/14/2019 1843 Full Code [id number]  Christel Mormon, MD ED   01/12/2017 1843 01/13/2017 1819 Full Code [id number]  Loletha Grayer, MD ED   01/04/2017 0215 01/04/2017 2232 Full Code [id number]  Harvie Bridge, DO Inpatient   06/13/2015 2128 06/14/2015 1950 Full Code [id number]  Bettey Costa, MD Inpatient   Advance Care Planning Activity     Family Communication: Left message for daughter Disposition Plan: Unsafe discharge home  Consultants:  Psychiatry  Time spent: 28 minutes  [place]

## 2019-03-19 NOTE — Care Management Obs Status (Signed)
[facility] NOTIFICATION   Patient Details  Name: [name] MRN: [id number] Date of Birth: 21-Dec-1945   Medicare Observation Status Notification Given:  No(pt refuses document as she feels if she signs it she will have to pay hospital bill in full. Patient noted to be confused with paranoia)    Latanya Maudlin, RN 03/19/2019, 8:55 AM

## 2019-03-19 NOTE — Progress Notes (Signed)
Patient is so paranoid, she will not cooperate, fixated on the numbers on the white board.

## 2019-03-20 LAB — URINE CULTURE
Culture: <10000 — AB
Special Requests: NORMAL

## 2019-03-20 NOTE — Consult Note (Signed)
  73 year old woman who presented on Friday with psychotic symptoms.  At that point writer believed that patient was suffering from the sequelae  of hypothyroidism.  She was admitted to medicine due to AMS and difficulty ambulating.  Notably her psychotic symptoms were paranoid ideations as well as visual hallucinations and general disorganization of her thought process.  Upon evaluation today, patient is no longer experiencing any visual hallucinations.  She denies auditory hallucinations.  She denies any mood symptoms including depression and suicidal ideations or anxiety.  Patient does not have the overt paranoia that she pre previously displayed, and her thought process is much more linear.  At this point however patient is still probably not yet back to baseline.  Her thought process is still somewhat circular and that she will repeat herself multiple times.  In addition she is still somewhat guarded and perhaps slightly paranoid regarding her medical treatment and medical staff.  This is however a great improvement from before.  Her outpatient doctor, Dr. Jamse Arn from [facility] was contacted.  Dr. Jamse Arn stated that the patient has no history of psychosis and she was surprised to learn of the patient clinical state on Friday.  She reports that the patient has no history of dementia documented on file and that she was last seen in August of this year where she provided her with monthly prescriptions for 40 mg of Prozac and 0.5 mg of Ativan.   Plan: Continue to hold Prozac. Continue with lorazepam Patient is not yet back at baseline

## 2019-03-20 NOTE — Progress Notes (Signed)
Pt rested well and less agitated after her bedtime medications and HS snack. Pleasant this am but still paranoid and fixated on numbers on white board and her meal receipts. AM snack given states "I'm so hungry".

## 2019-03-20 NOTE — Progress Notes (Signed)
Patient ID: Danielle KaysBetty C Tout, female   DOB: 07/22/1945, 73 y.o.   MRN: [id number] [facility] PROGRESS NOTE  [name] [id number] DOB: 07/22/1945 DOA: 03/17/2019 PCP: Wilford CornerWhitaker, Jason Hestle, PA-C  HPI/Subjective: Able to have a better conversation with her today.  She was able to focus better.  She knew details about her medications and dosages.  She states that she is not confused today.  Patient wants to make sure that she is doing well before getting out of the hospital.  Objective: Vitals:   03/20/19 0550 03/20/19 1345  BP: (!) 144/72 138/80  Pulse: 67 77  Resp: 19 17  Temp: 97.9 F (36.6 C) 99.1 F (37.3 C)  SpO2: 99% 97%    Intake/Output Summary (Last 24 hours) at 03/20/2019 1355 Last data filed at 03/20/2019 0900 Gross per 24 hour  Intake 480 ml  Output -  Net 480 ml   Filed Weights   03/17/19 0521 03/17/19 2200  Weight: 50.8 kg 50.5 kg    ROS: Review of Systems  Constitutional: Negative for chills and fever.  Eyes: Negative for blurred vision.  Respiratory: Negative for cough and shortness of breath.   Cardiovascular: Negative for chest pain.  Gastrointestinal: Negative for abdominal pain, constipation, diarrhea, nausea and vomiting.  Genitourinary: Negative for dysuria.  Musculoskeletal: Negative for joint pain.  Neurological: Negative for dizziness and headaches.   Exam: Physical Exam  HENT:  Nose: No mucosal edema.  Mouth/Throat: No oropharyngeal exudate or posterior oropharyngeal edema.  Eyes: Pupils are equal, round, and reactive to light. Conjunctivae, EOM and lids are normal.  Neck: No JVD present. Carotid bruit is not present. No edema present. No thyroid mass and no thyromegaly present.  Cardiovascular: S1 normal and S2 normal. Exam reveals no gallop.  No murmur heard. Pulses:      Dorsalis pedis pulses are 2+ on the right side and 2+ on the left side.  Respiratory: No respiratory distress. She has no wheezes. She has no rhonchi. She has  no rales.  GI: Soft. Bowel sounds are normal. There is no abdominal tenderness.  Musculoskeletal:     Right ankle: She exhibits no swelling.     Left ankle: She exhibits no swelling.  Lymphadenopathy:    She has no cervical adenopathy.  Neurological: She is alert. No cranial nerve deficit.  Skin: Skin is warm. No rash noted. Nails show no clubbing.  Psychiatric: Her mood appears anxious.      Data Reviewed: Basic Metabolic Panel: Recent Labs  Lab 03/16/19 1336 03/17/19 0516  NA 125* 131*  K 4.3 4.0  CL 90* 95*  CO2 22 25  GLUCOSE 108* 110*  BUN 13 10  CREATININE 0.64 0.63  CALCIUM 9.3 9.2   Liver Function Tests: Recent Labs  Lab 03/16/19 1336 03/17/19 0516  AST 23 28  ALT 14 13  ALKPHOS 50 51  BILITOT 0.6 0.7  PROT 7.2 7.4  ALBUMIN 4.4 4.6   No results for input(s): LIPASE, AMYLASE in the last 168 hours. Recent Labs  Lab 03/17/19 0604  AMMONIA 13   CBC: Recent Labs  Lab 03/16/19 1336 03/17/19 0516  WBC 8.4 7.4  NEUTROABS  --  4.7  HGB 14.1 14.3  HCT 41.1 42.0  MCV 92.8 94.0  PLT 241 215   Cardiac Enzymes: Recent Labs  Lab 03/17/19 0604  CKTOTAL 82    CBG: Recent Labs  Lab 03/13/19 1656 03/13/19 2131 03/14/19 0752 03/14/19 1138  GLUCAP 121* 127* 115* 173*  Recent Results (from the past 240 hour(s))  SARS CORONAVIRUS 2 (TAT 6-24 HRS) Nasopharyngeal Nasopharyngeal Swab     Status: None   Collection Time: 03/11/19  6:09 AM   Specimen: Nasopharyngeal Swab  Result Value Ref Range Status   SARS Coronavirus 2 NEGATIVE NEGATIVE Final    Comment: (NOTE) SARS-CoV-2 target nucleic acids are NOT DETECTED. The SARS-CoV-2 RNA is generally detectable in upper and lower respiratory specimens during the acute phase of infection. Negative results do not preclude SARS-CoV-2 infection, do not rule out co-infections with other pathogens, and should not be used as the sole basis for treatment or other patient management decisions. Negative results  must be combined with clinical observations, patient history, and epidemiological information. The expected result is Negative. Fact Sheet for Patients: SugarRoll.be Fact Sheet for Healthcare Providers: https://www.woods-mathews.com/ This test is not yet approved or cleared by the [place] FDA and  has been authorized for detection and/or diagnosis of SARS-CoV-2 by FDA under an Emergency Use Authorization (EUA). This EUA will remain  in effect (meaning this test can be used) for the duration of the COVID-19 declaration under Section 56 4(b)(1) of the Act, 21 U.S.C. section 360bbb-3(b)(1), unless the authorization is terminated or revoked sooner. Performed at [facility], [place] [address], [place], [place] [address]   SARS CORONAVIRUS 2 (TAT 6-24 HRS) Nasopharyngeal Nasopharyngeal Swab     Status: None   Collection Time: 03/16/19  5:38 PM   Specimen: Nasopharyngeal Swab  Result Value Ref Range Status   SARS Coronavirus 2 NEGATIVE NEGATIVE Final    Comment: (NOTE) SARS-CoV-2 target nucleic acids are NOT DETECTED. The SARS-CoV-2 RNA is generally detectable in upper and lower respiratory specimens during the acute phase of infection. Negative results do not preclude SARS-CoV-2 infection, do not rule out co-infections with other pathogens, and should not be used as the sole basis for treatment or other patient management decisions. Negative results must be combined with clinical observations, patient history, and epidemiological information. The expected result is Negative. Fact Sheet for Patients: SugarRoll.be Fact Sheet for Healthcare Providers: https://www.woods-mathews.com/ This test is not yet approved or cleared by the [place] FDA and  has been authorized for detection and/or diagnosis of SARS-CoV-2 by FDA under an Emergency Use Authorization (EUA). This EUA will remain  in effect  (meaning this test can be used) for the duration of the COVID-19 declaration under Section 56 4(b)(1) of the Act, 21 U.S.C. section 360bbb-3(b)(1), unless the authorization is terminated or revoked sooner. Performed at [facility], [place] [address] [place], [place] [address]   SARS CORONAVIRUS 2 (TAT 6-24 HRS) Nasopharyngeal Nasopharyngeal Swab     Status: None   Collection Time: 03/17/19  3:45 PM   Specimen: Nasopharyngeal Swab  Result Value Ref Range Status   SARS Coronavirus 2 NEGATIVE NEGATIVE Final    Comment: (NOTE) SARS-CoV-2 target nucleic acids are NOT DETECTED. The SARS-CoV-2 RNA is generally detectable in upper and lower respiratory specimens during the acute phase of infection. Negative results do not preclude SARS-CoV-2 infection, do not rule out co-infections with other pathogens, and should not be used as the sole basis for treatment or other patient management decisions. Negative results must be combined with clinical observations, patient history, and epidemiological information. The expected result is Negative. Fact Sheet for Patients: SugarRoll.be Fact Sheet for Healthcare Providers: https://www.woods-mathews.com/ This test is not yet approved or cleared by the [place] FDA and  has been authorized for detection and/or diagnosis  of SARS-CoV-2 by FDA under an Emergency Use Authorization (EUA). This EUA will remain  in effect (meaning this test can be used) for the duration of the COVID-19 declaration under Section 56 4(b)(1) of the Act, 21 U.S.C. section 360bbb-3(b)(1), unless the authorization is terminated or revoked sooner. Performed at [facility], [address] [place], [place] [address]       Scheduled Meds: . aspirin EC  81 mg Oral Daily  . calcium-vitamin D  1 tablet Oral Daily  . enoxaparin (LOVENOX) injection  40 mg Subcutaneous Q24H  . fluticasone  2 spray Each Nare Daily  .  levothyroxine  112 mcg Oral QAC breakfast  . LORazepam  0.5 mg Oral QHS  . lubiprostone  8 mcg Oral BID WC  . Melatonin  5 mg Oral QHS  . metFORMIN  500 mg Oral Q breakfast  . mometasone-formoterol  2 puff Inhalation BID  . senna  1 tablet Oral BID  . simvastatin  20 mg Oral q1800  . tiotropium  18 mcg Inhalation Daily   Continuous Infusions:  Assessment/Plan:  1. Acute metabolic encephalopathy.  Urine analysis negative.  TSH slightly elevated so I increased her levothyroxine.  Reconsult psych to evaluate.  I am still concerned about her living alone. 2. Hypothyroidism on Synthroid increased to 112 mcg daily 3. Type 2 diabetes mellitus on Metformin 4. Hyperlipidemia unspecified on simvastatin 5. COPD.  Respiratory status stable continue inhalers  Code Status:     Code Status Orders  (From admission, onward)         Start     Ordered   03/17/19 1832  Full code  Continuous     03/17/19 1831        Code Status History    Date Active Date Inactive Code Status Order ID Comments User Context   03/11/2019 0725 03/14/2019 1843 Full Code [id number]  Arville Care Vernetta Honey, MD ED   01/12/2017 1843 01/13/2017 1819 Full Code [id number]  Alford Highland, MD ED   01/04/2017 0215 01/04/2017 2232 Full Code [id number]  Hugelmeyer, Weed, DO Inpatient   06/13/2015 2128 06/14/2015 1950 Full Code [id number]  Adrian Saran, MD Inpatient   Advance Care Planning Activity     Family Communication: Wrong number for daughter in computer. Disposition Plan: Unsafe discharge home  Consultants:  Psychiatry  Time spent: 27 minutes  [employer]

## 2019-03-20 NOTE — Progress Notes (Signed)
Physical Therapy Treatment Patient Details Name: [name] MRN: [id number] DOB: 12/11/1945 Today's Date: 03/20/2019    History of Present Illness presented to ER secondary to AMS, paranoia; admitted for management of acute metabolic encephalopathy.    PT Comments    Upon evaluation, patient alert and oriented to basic information; follows commands and demonstrates fair insight into mobility needs.  Patient generally hyperverbal, noted difficulty maintaining topic of conversation at times.  Bilat UE/LE strength and ROM grossly symmetrical and WFL; no focal weakness, sensory or coordination deficit noted.  Able to complete bed mobility indep; sit/stand, basic transfers and gait (350') without assist device, mod indep.  Demonstrates reciprocal stepping pattern with good step height/length, good trunk rotation and arm swing; completes dynamic gait components (head turns, start/stop, change of direction) easily and without buckling, LOB or safety concern.  Gait speed consistent and confident, completing 10' walk time, 6 seconds. Patient does appear to be at baseline level of functional ability. No acute PT needs indicated; do recommend continued mobilization in room/around unit with staff as appropriate throughout remaining hospital stay. Would benefit from OT evaluation for assessment of independent living skills; consult placed.     Follow Up Recommendations  No PT follow up;Supervision/Assistance - 24 hour     Equipment Recommendations       Recommendations for Other Services       Precautions / Restrictions Precautions Precautions: Fall Restrictions Weight Bearing Restrictions: No    Mobility  Bed Mobility Overal bed mobility: Independent                Transfers Overall transfer level: Modified independent Equipment used: None             General transfer comment: sit/stand from various surface heights (bed, recliner, toilet) without assist device, mod indep;  good LE strength/stability  Ambulation/Gait Ambulation/Gait assistance: Modified independent (Device/Increase time) Gait Distance (Feet): 300 Feet Assistive device: None   Gait velocity: 10' walk time, 6 seconds   General Gait Details: reciprocal stepping pattern with good step height/length, good trunk rotation and arm swing; completes dynamic gait components (head turns, start/stop, change of direction) easily and without buckling, LOB or safety concern   Stairs             Wheelchair Mobility    Modified Rankin (Stroke Patients Only)       Balance Overall balance assessment: Modified Independent                                          Cognition Arousal/Alertness: Awake/alert Behavior During Therapy: WFL for tasks assessed/performed Overall Cognitive Status: No family/caregiver present to determine baseline cognitive functioning                                 General Comments: alert and oriented to self, location and general situation; speech very hyperverbal with noted difficulty maintaining attention/focus to topic at hand      Exercises Other Exercises Other Exercises: Toilet transfer, ambulatory without assist device, mod indep; sit/stand from standard toilet, mod indep; standing balance for hygiene, clothing management, mod indep    General Comments        Pertinent Vitals/Pain Pain Assessment: No/denies pain    Home Living Family/patient expects to be discharged to:: Private residence Living Arrangements: Alone Available Help at Discharge: (  caregiver 2 hours/day) Type of Home: House       Home Equipment: Gilmer Mor - single point      Prior Function Level of Independence: Independent      Comments: Indep with ADLs, household mobilization; has SPC if needed.   PT Goals (current goals can now be found in the care plan section) Acute Rehab PT Goals Patient Stated Goal: to return home PT Goal Formulation: All  assessment and education complete, DC therapy Time For Goal Achievement: 03/20/19 Potential to Achieve Goals: Good    Frequency           PT Plan      Co-evaluation              AM-PAC PT "6 Clicks" Mobility   Outcome Measure  Help needed turning from your back to your side while in a flat bed without using bedrails?: None Help needed moving from lying on your back to sitting on the side of a flat bed without using bedrails?: None Help needed moving to and from a bed to a chair (including a wheelchair)?: None Help needed standing up from a chair using your arms (e.g., wheelchair or bedside chair)?: None Help needed to walk in hospital room?: None Help needed climbing 3-5 steps with a railing? : A Little 6 Click Score: 23    End of Session Equipment Utilized During Treatment: Gait belt Activity Tolerance: Patient tolerated treatment well Patient left: in chair;with call bell/phone within reach;with chair alarm set Nurse Communication: Mobility status PT Visit Diagnosis: Difficulty in walking, not elsewhere classified (R26.2);Muscle weakness (generalized) (M62.81)     Time: 3474-2595 PT Time Calculation (min) (ACUTE ONLY): 28 min  Charges:

## 2019-03-20 NOTE — Progress Notes (Signed)
Patient refused all 2200 medication.  RN attempted to educate patient regarding need for compliance, Patient refused to acknowledge any need for medication.  Patient became anxious and staring yellng out, " Those clocks do not match and I don't trust anything you all are trying to give me."  RN returned medications, informed charge nurse of refusal of medications.

## 2019-03-21 MED ORDER — HALOPERIDOL LACTATE 5 MG/ML IJ SOLN: 2.0000 mg | Freq: Once | INTRAMUSCULAR | Status: DC

## 2019-03-21 MED ORDER — ADULT MULTIVITAMIN W/MINERALS CH
1.0000 | ORAL_TABLET | Freq: Every day | ORAL | Status: DC
  Administered 2019-03-22 – 2019-03-23 (×2): 1 via ORAL
  Filled 2019-03-21 (×2): qty 1

## 2019-03-21 MED ORDER — LORAZEPAM 2 MG/ML IJ SOLN
1.0000 mg | Freq: Once | INTRAMUSCULAR | Status: AC
  Administered 2019-03-21: 01:00:00 1 mg via INTRAVENOUS
  Filled 2019-03-21: qty 1

## 2019-03-21 MED ORDER — ENSURE ENLIVE PO LIQD
237.0000 mL | Freq: Two times a day (BID) | ORAL | Status: DC
  Administered 2019-03-21 – 2019-03-23 (×5): 237 mL via ORAL

## 2019-03-21 NOTE — Consult Note (Signed)
  73 year old woman who presented on Friday with psychotic symptoms.  At that point writer believed that patient was suffering from the sequelae  of hypothyroidism.  She was admitted to medicine due to AMS and difficulty ambulating.  Notably her psychotic symptoms were paranoid ideations as well as visual hallucinations and general disorganization of her thought process. Upon evaluation yesterday, patient is no longer experiencing any visual hallucinations.  She denies auditory hallucinations.  She denies any mood symptoms including depression and suicidal ideations or anxiety.  Patient does not have the overt paranoia that she pre previously displayed, and her thought process is much more linear.   Patient was re-evaluated on 11/3: Patient is making incremental progress. She is more alert today but able to state that she knows herself that she is till confused. Patient still have difficulty with linear thought process. She remains somewhat focused on appeal process but is not able to reconcile those thoughts with her current condition.  At this point however patient is still not yet back to baseline.  Her thought process is still somewhat circular and that she will repeat herself multiple times.  In addition she is still somewhat guarded and perhaps slightly paranoid regarding her medical treatment and medical staff.  This is however a great improvement from before.  Her outpatient doctor, Dr. Jamse Arn from [place] was contacted.  Dr. Jamse Arn stated that the patient has no history of psychosis and she was surprised to learn of the patient clinical state on Friday.  She reports that the patient has no history of dementia documented on file and that she was last seen in August of this year where she provided her with monthly prescriptions for 40 mg of Prozac and 0.5 mg of Ativan.   Plan: Continue to hold Prozac. Continue with lorazepam as needed Patient is still not yet back at baseline

## 2019-03-21 NOTE — Progress Notes (Signed)
RN requested Danielle Laughter, NP to review patients information and order something for restlessness and anxiety RN informed of earlier refusal to take ordered Ativan; IV Ativan ordered.  Patient hard to redirect, continues to cycle with clocks not matching and handwriting not being proper script.

## 2019-03-21 NOTE — TOC Initial Note (Signed)
Transition of Care [facility]) - Initial/Assessment Note    Patient Details  Name: [name] MRN: [id number] Date of Birth: Jan 24, 1946  Transition of Care [facility]) CM/SW Contact:    Candie Chroman, LCSW Phone Number: 03/21/2019, 3:48 PM  Clinical Narrative: CSW met with patient. No supports at bedside. CSW introduced role and explained that discharge planning would be discussed. Patient lives home alone. She has aides through a personal care service called [facility] that come in for two hours, 7 days a week. She does not use any DME at home. Patient gave CSW permission to call her aides and her [place] case worker, Occupational hygienist. CSW spoke with Durenda Hurt [phone]) and Nehemiah Massed [phone]) from [facility]. Ms. Amalia Hailey stated that patient may need a home health nurse for medication organization. She stated that patient recently had some medication changes and thinks she got confused about it and overtook medication. CSW spoke with Ms. Routh and she said patient cannot get more than 2 hours per day because that is all Medicaid will pay for. Patient stated Robinette Haines has her key and will take her home at discharge. CSW left voicemail for [place] [phone]). Will confirm when he calls back. Patient is requesting to speak with palliative so she can make her wishes known and discuss code status. MD is aware. Patient does not want her children to have to make tough choices in regards to her care. No further concerns. CSW encouraged patient to contact CSW as needed. CSW will continue to follow patient for support and facilitate return home when stable.         Expected Discharge Plan: [place] Barriers to Discharge: Continued Medical Work up   Patient Goals and CMS Choice        Expected Discharge Plan and Services Expected Discharge Plan: [place] Acute Care Choice: Resumption of Svcs/PTA Provider Living arrangements for the past 2 months: Apartment                                      Prior Living Arrangements/Services Living arrangements for the past 2 months: Apartment Lives with:: Self Patient language and need for interpreter reviewed:: Yes Do you feel safe going back to the place where you live?: Yes      Need for Family Participation in Patient Care: Yes (Comment) Care giver support system in place?: Yes (comment) Current home services: Other (comment)(Personal care aides) Criminal Activity/Legal Involvement Pertinent to Current Situation/Hospitalization: No - Comment as needed  Activities of Daily Living Home Assistive Devices/Equipment: None, Bedside commode/3-in-1 ADL Screening (condition at time of admission) Patient's cognitive ability adequate to safely complete daily activities?: Yes Is the patient deaf or have difficulty hearing?: No Does the patient have difficulty seeing, even when wearing glasses/contacts?: No Does the patient have difficulty concentrating, remembering, or making decisions?: No Patient able to express need for assistance with ADLs?: Yes Does the patient have difficulty dressing or bathing?: No Independently performs ADLs?: Yes (appropriate for developmental age) Does the patient have difficulty walking or climbing stairs?: Yes Weakness of Legs: Both Weakness of Arms/Hands: None  Permission Sought/Granted Permission sought to share information with : [facility] administrator granted to share info w AGENCY: [facility] and [facility]        Emotional Assessment Appearance:: Appears stated age  Attitude/Demeanor/Rapport: Engaged, Gracious Affect (typically observed): Accepting, Appropriate, Calm, Pleasant Orientation: : Oriented to Self, Oriented to Place, Oriented to  Time, Oriented to Situation Alcohol / Substance Use: Never Used Psych Involvement: Yes (comment), Outpatient Provider  Admission diagnosis:  Confusion [R41.0] Altered mental status, unspecified  altered mental status type [R41.82] Patient Active Problem List   Diagnosis Date Noted  . Acute metabolic encephalopathy 54/65/0354  . Type 2 diabetes mellitus with hyperlipidemia ([place])   . Chronic obstructive pulmonary disease ([place])   . Confusion 03/17/2019  . AMS (altered mental status) 03/12/2019  . Acute encephalopathy 03/11/2019  . Hypothyroidism 03/11/2019  . Altered mental status, unspecified 03/11/2019  . Orthostatic hypotension 01/12/2017  . Bronchitis 01/04/2017  . Unstable angina ([place]) 06/13/2015  . UTI (lower urinary tract infection) 06/13/2015  . Degenerative disc disease, lumbar 10/18/2014  . Sacroiliac joint dysfunction 10/18/2014  . Facet syndrome, lumbar 10/18/2014  . Cervical facet joint syndrome 10/18/2014  . Bilateral occipital neuralgia 10/18/2014  . Migraine 10/18/2014  . Neurosis, posttraumatic 10/08/2014  . Spinal stenosis   . PTSD (post-traumatic stress disorder)    PCP:  Donnamarie Rossetti, PA-C Pharmacy:   [facility] [place], [place] [place] [address] Phone: [phone] Fax: [phone]     Social Determinants of Health (SDOH) Interventions    Readmission Risk Interventions No flowsheet data found.

## 2019-03-21 NOTE — Progress Notes (Signed)
Patient ID: [name], female   DOB: 15-Jul-1945, 73 y.o.   MRN: [id number] [facility] PROGRESS NOTE  [name] VOH:[id number] DOB: July 28, 1945 DOA: 03/17/2019 PCP: Grayland Ormond Hestle, PA-C  HPI/Subjective: Patient feeling better.  And will have better conversation with me yesterday and today.  Able to ask more questions and tell me about her contacts.  Objective: Vitals:   03/21/19 0603 03/21/19 1222  BP: 130/68 126/76  Pulse: 82 (!) 108  Resp: 18   Temp: 98.4 F (36.9 C) 98.4 F (36.9 C)  SpO2: 96% 96%    Intake/Output Summary (Last 24 hours) at 03/21/2019 1604 Last data filed at 03/21/2019 1050 Gross per 24 hour  Intake 600 ml  Output 200 ml  Net 400 ml   Filed Weights   03/17/19 0521 03/17/19 2200  Weight: 50.8 kg 50.5 kg    ROS: Review of Systems  Constitutional: Negative for chills and fever.  Eyes: Negative for blurred vision.  Respiratory: Negative for cough and shortness of breath.   Cardiovascular: Negative for chest pain.  Gastrointestinal: Negative for abdominal pain, constipation, diarrhea, nausea and vomiting.  Genitourinary: Negative for dysuria.  Musculoskeletal: Negative for joint pain.  Neurological: Negative for dizziness and headaches.   Exam: Physical Exam  HENT:  Nose: No mucosal edema.  Mouth/Throat: No oropharyngeal exudate or posterior oropharyngeal edema.  Eyes: Pupils are equal, round, and reactive to light. Conjunctivae, EOM and lids are normal.  Neck: No JVD present. Carotid bruit is not present. No edema present. No thyroid mass and no thyromegaly present.  Cardiovascular: S1 normal and S2 normal. Exam reveals no gallop.  No murmur heard. Pulses:      Dorsalis pedis pulses are 2+ on the right side and 2+ on the left side.  Respiratory: No respiratory distress. She has no wheezes. She has no rhonchi. She has no rales.  GI: Soft. Bowel sounds are normal. There is no abdominal tenderness.  Musculoskeletal:     Right  ankle: She exhibits no swelling.     Left ankle: She exhibits no swelling.  Lymphadenopathy:    She has no cervical adenopathy.  Neurological: She is alert. No cranial nerve deficit.  Skin: Skin is warm. No rash noted. Nails show no clubbing.  Psychiatric: Her mood appears anxious.      Data Reviewed: Basic Metabolic Panel: Recent Labs  Lab 03/16/19 1336 03/17/19 0516  NA 125* 131*  K 4.3 4.0  CL 90* 95*  CO2 22 25  GLUCOSE 108* 110*  BUN 13 10  CREATININE 0.64 0.63  CALCIUM 9.3 9.2   Liver Function Tests: Recent Labs  Lab 03/16/19 1336 03/17/19 0516  AST 23 28  ALT 14 13  ALKPHOS 50 51  BILITOT 0.6 0.7  PROT 7.2 7.4  ALBUMIN 4.4 4.6    Recent Labs  Lab 03/17/19 0604  AMMONIA 13   CBC: Recent Labs  Lab 03/16/19 1336 03/17/19 0516  WBC 8.4 7.4  NEUTROABS  --  4.7  HGB 14.1 14.3  HCT 41.1 42.0  MCV 92.8 94.0  PLT 241 215   Cardiac Enzymes: Recent Labs  Lab 03/17/19 0604  CKTOTAL 82      Recent Results (from the past 240 hour(s))  SARS CORONAVIRUS 2 (TAT 6-24 HRS) Nasopharyngeal Nasopharyngeal Swab     Status: None   Collection Time: 03/16/19  5:38 PM   Specimen: Nasopharyngeal Swab  Result Value Ref Range Status   SARS Coronavirus 2 NEGATIVE NEGATIVE Final  Comment: (NOTE) SARS-CoV-2 target nucleic acids are NOT DETECTED. The SARS-CoV-2 RNA is generally detectable in upper and lower respiratory specimens during the acute phase of infection. Negative results do not preclude SARS-CoV-2 infection, do not rule out co-infections with other pathogens, and should not be used as the sole basis for treatment or other patient management decisions. Negative results must be combined with clinical observations, patient history, and epidemiological information. The expected result is Negative. Fact Sheet for Patients: HairSlick.nohttps://www.fda.gov/media/138098/download Fact Sheet for Healthcare Providers: quierodirigir.comhttps://www.fda.gov/media/138095/download This test  is not yet approved or cleared by the [place] FDA and  has been authorized for detection and/or diagnosis of SARS-CoV-2 by FDA under an Emergency Use Authorization (EUA). This EUA will remain  in effect (meaning this test can be used) for the duration of the COVID-19 declaration under Section 56 4(b)(1) of the Act, 21 U.S.C. section 360bbb-3(b)(1), unless the authorization is terminated or revoked sooner. Performed at [facility], [address] [place], [place] [address]   SARS CORONAVIRUS 2 (TAT 6-24 HRS) Nasopharyngeal Nasopharyngeal Swab     Status: None   Collection Time: 03/17/19  3:45 PM   Specimen: Nasopharyngeal Swab  Result Value Ref Range Status   SARS Coronavirus 2 NEGATIVE NEGATIVE Final    Comment: (NOTE) SARS-CoV-2 target nucleic acids are NOT DETECTED. The SARS-CoV-2 RNA is generally detectable in upper and lower respiratory specimens during the acute phase of infection. Negative results do not preclude SARS-CoV-2 infection, do not rule out co-infections with other pathogens, and should not be used as the sole basis for treatment or other patient management decisions. Negative results must be combined with clinical observations, patient history, and epidemiological information. The expected result is Negative. Fact Sheet for Patients: HairSlick.nohttps://www.fda.gov/media/138098/download Fact Sheet for Healthcare Providers: quierodirigir.comhttps://www.fda.gov/media/138095/download This test is not yet approved or cleared by the [place] FDA and  has been authorized for detection and/or diagnosis of SARS-CoV-2 by FDA under an Emergency Use Authorization (EUA). This EUA will remain  in effect (meaning this test can be used) for the duration of the COVID-19 declaration under Section 56 4(b)(1) of the Act, 21 U.S.C. section 360bbb-3(b)(1), unless the authorization is terminated or revoked sooner. Performed at [facility], [address] [place], [place] [address]    Urine Culture     Status: Abnormal   Collection Time: 03/19/19  4:25 PM   Specimen: Urine, Catheterized  Result Value Ref Range Status   Specimen Description   Final    URINE, CATHETERIZED Performed at [facility], [address] [place], [place] [address]    Special Requests   Final    Normal Performed at [facility], [address] [place], [place] [address]    Culture (A)  Final    <10,000 COLONIES/mL INSIGNIFICANT GROWTH Performed at [facility], [address] [place], [place] [address]    Report Status 03/20/2019 FINAL  Final      Scheduled Meds: . aspirin EC  81 mg Oral Daily  . calcium-vitamin D  1 tablet Oral Daily  . enoxaparin (LOVENOX) injection  40 mg Subcutaneous Q24H  . feeding supplement (ENSURE ENLIVE)  237 mL Oral BID BM  . fluticasone  2 spray Each Nare Daily  . haloperidol lactate  2 mg Intravenous Once  . levothyroxine  112 mcg Oral QAC breakfast  . LORazepam  0.5 mg Oral QHS  . lubiprostone  8 mcg Oral BID WC  . Melatonin  5 mg  Oral QHS  . metFORMIN  500 mg Oral Q breakfast  . mometasone-formoterol  2 puff Inhalation BID  . [START ON 03/22/2019] multivitamin with minerals  1 tablet Oral Daily  . senna  1 tablet Oral BID  . simvastatin  20 mg Oral q1800  . tiotropium  18 mcg Inhalation Daily   Continuous Infusions:  Assessment/Plan:  1. Acute metabolic encephalopathy.  Urine analysis negative.  TSH slightly elevated so I increased her levothyroxine.  Appreciate psychiatric consultation.  Psychiatry wants to watch her a little longer here in the hospital prior to disposition.  She does have private aides with [facility].  Etheleen Mayhew at [phone].  Social work team to contact them about potential disposition soon. 2. Hypothyroidism on Synthroid increased to 112 mcg daily 3. Type 2 diabetes mellitus on Metformin 4. Hyperlipidemia unspecified on simvastatin 5. COPD.  Respiratory status stable continue  inhalers  Code Status:     Code Status Orders  (From admission, onward)         Start     Ordered   03/17/19 1832  Full code  Continuous     03/17/19 1831        Code Status History    Date Active Date Inactive Code Status Order ID Comments User Context   03/11/2019 0725 03/14/2019 1843 Full Code [id number]  [facility] Vernetta Honey, MD ED   01/12/2017 1843 01/13/2017 1819 Full Code [id number]  Alford Highland, MD ED   01/04/2017 0215 01/04/2017 2232 Full Code [id number]  [place], [place], DO Inpatient   06/13/2015 2128 06/14/2015 1950 Full Code [id number]  Adrian Saran, MD Inpatient   Advance Care Planning Activity     Family Communication: Social worker contacting [facility] and [facility]. Disposition Plan: Psychiatry wants to watch a little further here in the hospital.  Consultants:  Psychiatry  Time spent: 26 minutes  [name] [employer]

## 2019-03-21 NOTE — Progress Notes (Signed)
Initial Nutrition Assessment  DOCUMENTATION CODES:   Underweight  INTERVENTION:   Ensure Enlive po BID, each supplement provides 350 kcal and 20 grams of protein  MVI daily   Liberalize diet   NUTRITION DIAGNOSIS:   Increased nutrient needs related to catabolic illness(COPD) as evidenced by increased estimated needs.  GOAL:   Patient will meet greater than or equal to 90% of their needs  MONITOR:   PO intake, Supplement acceptance, Labs, Weight trends, Skin, I & O's  REASON FOR ASSESSMENT:   Other (Comment)(Low BMI)    ASSESSMENT:   73 y.o. female with a known history of anxiety, depression, hypertension, PTSD, hypothyroidism comes to the emergency room with confusion.  RD working remotely.  Unable to speak with patient r/t AMS. Per chart review, pt eating 100% of meals in hospital. RD will add supplements and MVI to help pt meet her estimated needs. RD will also liberalize diet as a heart healthy diet is restrictive of protein and pt is underweight. Per chart, pt appears to have lost 29lbs(21%) over the past year; this is significant.   Pt is at high risk for malnutrition but unable to diagnose at this time as NFPE cannot be performed.   Medications reviewed and include: aspirin, lovenox, melatonin, metformin, senokot  Labs reviewed: Na 131(L) AIC 5.8(H)- 10/24  Unable to complete Nutrition-Focused physical exam at this time.   Diet Order:   Diet Order            Diet Heart Room service appropriate? Yes; Fluid consistency: Thin  Diet effective now             EDUCATION NEEDS:   Not appropriate for education at this time  Skin:  Skin Assessment: Reviewed RN Assessment(Ecchymosis)  Last BM:  10/31  Height:   Ht Readings from Last 1 Encounters:  03/17/19 5\' 5"  (1.651 m)    Weight:   Wt Readings from Last 1 Encounters:  03/17/19 50.5 kg    Ideal Body Weight:  56.8 kg  BMI:  Body mass index is 18.53 kg/m.  Estimated Nutritional Needs:    Kcal:  1400-1600kcal/day  Protein:  75-85g/day  Fluid:  >1.3L/day  Koleen Distance MS, RD, LDN Pager #- [phone] Office#- [phone] After Hours Pager: [phone]

## 2019-03-21 NOTE — Evaluation (Signed)
Occupational Therapy Evaluation Patient Details Name: [name] MRN: [id number] DOB: Aug 14, 1945 Today's Date: 03/21/2019    History of Present Illness presented to ER secondary to AMS, paranoia; admitted for management of acute metabolic encephalopathy.   Clinical Impression   Pt seen for OT evaluation this date. Prior to hospital admission, pt was ambulating without AD and generally independent with basic ADL. Pt lives alone but has aides come daily for 2 hrs/day who assist with cleaning, cues for taking medication, supervision while pt sets up her weekly pill box, and assist for foot care. Currently pt demonstrates impairments in safety and problem solving requiring supervision and occasional verbal cues for safety during ADL and functional mobility tasks. During toileting task pt stood to perform hygiene with supervision and then began ambulating away from the toilet with the RW but without first pulling up her underwear. She then reached for hand sanitizer using RW with 1 hand to ambulate 5 ft until she could sit to rub the sanitizer into her hands. Cues for safety and proper sequencing to minimize falls risk. Pt verbalized understanding and provided her reasoning for not pulling her underwear up before hand, stating she normally does at home but did not this time (unclear reasoning). Pt able to provide reasonable and sound answers for situational safety activity. Pt reiterates her desire to be safe, not fall, and continue to be as independent as possible. At times during session rambles but with cues able to redirect. Pt would benefit from skilled OT to address noted impairments and functional limitations (see below for any additional details) in order to maximize safety and independence while minimizing falls risk and caregiver burden.  Upon hospital discharge, recommend pt discharge home with initial 24/7 supervision/assist for safety.    Follow Up Recommendations  Supervision/Assistance - 24  hour    Equipment Recommendations  None recommended by OT    Recommendations for Other Services       Precautions / Restrictions Precautions Precautions: Fall Restrictions Weight Bearing Restrictions: No      Mobility Bed Mobility Overal bed mobility: Independent                Transfers Overall transfer level: Modified independent Equipment used: None                  Balance Overall balance assessment: Modified Independent                                         ADL either performed or assessed with clinical judgement   ADL Overall ADL's : Needs assistance/impaired                         Toilet Transfer: Supervision/safety;RW;Regular Social workerToilet   Toileting- Clothing Manipulation and Hygiene: Supervision/safety;Sit to/from stand       Functional mobility during ADLs: Min guard;Rolling walker       Vision Baseline Vision/History: Wears glasses Wears Glasses: At all times Patient Visual Report: No change from baseline       Perception     Praxis      Pertinent Vitals/Pain Pain Assessment: No/denies pain     Hand Dominance Right   Extremity/Trunk Assessment Upper Extremity Assessment Upper Extremity Assessment: Overall WFL for tasks assessed   Lower Extremity Assessment Lower Extremity Assessment: Overall WFL for tasks assessed(grossly at least 4+/5, symmetrical)  Communication Communication Communication: No difficulties(hyperverbal, difficulty maintaining topic of conversation at times)   Cognition Arousal/Alertness: Awake/alert Behavior During Therapy: WFL for tasks assessed/performed Overall Cognitive Status: No family/caregiver present to determine baseline cognitive functioning                                 General Comments: Pt alert and oriented x4, appropriate throughout session, 1 cue for safety during toileting, able to provide appropriate reasonable responses to hypothetical  safety situations   General Comments       Exercises Other Exercises Other Exercises: during toileting task, pt stood to perform hygiene with supervision; pt then began ambulating away from the toilet with the RW but without pulling up her underwear, then reached for hand sanitizer using RW with 1 hand. Cues for safety and proper sequencing to minimize falls risk; pt verbalized understanding and provided her reasoning for not doing it but understood why the therapist recommended it another way   Shoulder Instructions      Home Living Family/patient expects to be discharged to:: Private residence Living Arrangements: Alone Available Help at Discharge: Personal care attendant;Available PRN/intermittently(pt reports 2 aides who come M-F and Sat-Sun, 2hrs at a time) Type of Home: House       Home Layout: One level     Bathroom Shower/Tub: Chief Strategy Officer: Standard(keeps BSC over top)     Home Equipment: Cane - single point;Grab bars - toilet;Grab bars - tub/shower;Hand held shower head;Bedside commode          Prior Functioning/Environment Level of Independence: Independent;Needs assistance  Gait / Transfers Assistance Needed: indep with mobility, has SPC if needed ADL's / Homemaking Assistance Needed: indep with ADL, aides could help with bathing if needed, pt prepares simple meals also gets Meals on Wheels, aides assist with cleaning, groceries, and foot care as well as reminders to take medications as well as providing supervision as pt sets up her medication for a week at a time            OT Problem List: Decreased safety awareness      OT Treatment/Interventions: Self-care/ADL training;Therapeutic exercise;Therapeutic activities;DME and/or AE instruction;Patient/family education;Balance training;Cognitive remediation/compensation    OT Goals(Current goals can be found in the care plan section) Acute Rehab OT Goals Patient Stated Goal: to return  home OT Goal Formulation: With patient Time For Goal Achievement: 04/04/19 Potential to Achieve Goals: Good ADL Goals Pt Will Transfer to Toilet: with modified independence;ambulating(LRAD for amb) Additional ADL Goal #1: Pt will perform medication mgt activity with supervision and written instructions  OT Frequency: Min 1X/week   Barriers to D/C: Decreased caregiver support          Co-evaluation              AM-PAC OT "6 Clicks" Daily Activity     Outcome Measure Help from another person eating meals?: None Help from another person taking care of personal grooming?: None Help from another person toileting, which includes using toliet, bedpan, or urinal?: A Little Help from another person bathing (including washing, rinsing, drying)?: A Little Help from another person to put on and taking off regular upper body clothing?: None Help from another person to put on and taking off regular lower body clothing?: A Little 6 Click Score: 21   End of Session Equipment Utilized During Treatment: Gait belt;Rolling walker  Activity Tolerance: Patient tolerated treatment well Patient left:  in bed;with call bell/phone within reach;with bed alarm set  OT Visit Diagnosis: Other abnormalities of gait and mobility (R26.89)                Time: 7373-6681 OT Time Calculation (min): 36 min Charges:  OT General Charges $OT Visit: 1 Visit OT Evaluation $OT Eval Low Complexity: 1 Low OT Treatments $Self Care/Home Management : 8-22 mins $Therapeutic Activity: 8-22 mins  Jeni Salles, MPH, MS, OTR/L ascom [phone] 03/21/19, 2:56 PM

## 2019-03-22 DIAGNOSIS — R410 Disorientation, unspecified: Secondary | ICD-10-CM

## 2019-03-22 MED ORDER — LORAZEPAM 2 MG/ML IJ SOLN
1.0000 mg | Freq: Once | INTRAMUSCULAR | Status: AC
  Administered 2019-03-22: 2 mg via INTRAVENOUS
  Filled 2019-03-22: qty 1

## 2019-03-22 NOTE — Progress Notes (Addendum)
PROGRESS NOTE  RAYVIN ABID  DOB: 05-20-1945  PCP: Grayland Ormond Marueno, PA-C NLZ:[id number]  DOA: 03/17/2019  LOS: 3 days   Brief narrative: [name] is a 73 y.o. female with PMH of HTN, DM2, CHF, Graves' disease, PTSD, anxiety, fibromyalgia, IBS. Patient was brought to the ED on 03/17/2019 after a neighbor found her outside her house confused.  She recently was hospitalized for confusion secondary to UTI and completed treatment with antibiotics. She was admitted under hospitalist medicine service for further evaluation and management. Psychiatry consultation was obtained.  Subjective: Patient was seen and examined this morning.  At the beginning of my evaluation, patient was alert, awake, oriented x3 and was able to have a normal conversation.  However within a few minutes, she started talking irrelevant and started going in circles.  At the end of conversation, she seemed to be confused why she was seeing me and asked the same question that I answered 3 minutes prior.  Assessment/Plan: Acute encephalopathy Acute psychotic symptoms -No medical/organic cause of her psychotic symptoms. -Psychiatry consultation appreciated.  Patient seems to have paranoid ideation, visual hallucination and generalized disorganization of thought process.  She seems to have waxing and waning pattern of her symptoms. -Per psychiatrist who had a conversation with patient primary care provider, patient did not have any history of dementia.  She was diagnosed with clinical depression in August 2020 and was prescribed Prozac 40 mg daily and Ativan 0.5 mg as needed. -Currently, Prozac is on hold.  Patient is only on as needed Ativan. -Her mental status is not yet back to baseline.  Hyponatremia -Sodium level was low at 125 at presentation, on last blood check on 10/31, did improve to 130.  We will repeat sodium level tomorrow.  Hypothyroidism -Continue increased dose of Synthroid  Type 2 diabetes  mellitus -Continue Metformin  Hyperlipidemia -continue statin.  COPD -Continue inhalers.  Mobility: Ambulates inside the room DVT prophylaxis:  Lovenox subcu Code Status:   Code Status: Full Code  Family Communication:  Expected Discharge:  Pending psychiatric clearance  Consultants:  Psychiatry  Procedures:  None  Antimicrobials: Anti-infectives (From admission, onward)   None      Diet Order            Diet regular Room service appropriate? No; Fluid consistency: Thin  Diet effective now              Infusions:    Scheduled Meds: . aspirin EC  81 mg Oral Daily  . calcium-vitamin D  1 tablet Oral Daily  . enoxaparin (LOVENOX) injection  40 mg Subcutaneous Q24H  . feeding supplement (ENSURE ENLIVE)  237 mL Oral BID BM  . fluticasone  2 spray Each Nare Daily  . haloperidol lactate  2 mg Intravenous Once  . levothyroxine  112 mcg Oral QAC breakfast  . LORazepam  0.5 mg Oral QHS  . lubiprostone  8 mcg Oral BID WC  . Melatonin  5 mg Oral QHS  . metFORMIN  500 mg Oral Q breakfast  . mometasone-formoterol  2 puff Inhalation BID  . multivitamin with minerals  1 tablet Oral Daily  . senna  1 tablet Oral BID  . simvastatin  20 mg Oral q1800  . tiotropium  18 mcg Inhalation Daily    PRN meds: acetaminophen **OR** acetaminophen, albuterol, ondansetron **OR** ondansetron (ZOFRAN) IV, ondansetron, polyethylene glycol   Objective: Vitals:   03/21/19 2019 03/22/19 0521  BP: 123/74 (!) 141/74  Pulse: 96 75  Resp: 20 20  Temp: 99.2 F (37.3 C) 98.4 F (36.9 C)  SpO2: 97% 98%    Intake/Output Summary (Last 24 hours) at 03/22/2019 1556 Last data filed at 03/22/2019 1550 Gross per 24 hour  Intake 480 ml  Output 150 ml  Net 330 ml   Filed Weights   03/17/19 0521 03/17/19 2200  Weight: 50.8 kg 50.5 kg   Weight change:  Body mass index is 18.53 kg/m.   Physical Exam: General exam: Appears calm and comfortable.  Not in physical distress Skin: No rashes,  lesions or ulcers. HEENT: Atraumatic, normocephalic, supple neck, no obvious bleeding Lungs: Clear to auscultation bilaterally CVS: Regular rate and rhythm, no murmur GI/Abd soft, nontender, nondistended, bowel sound present CNS: Alert, awake, oriented x3 Psychiatry: Disorganized thoughts Extremities: No pedal edema, no calf tenderness  Data Review: I have personally reviewed the laboratory data and studies available.  Recent Labs  Lab 03/16/19 1336 03/17/19 0516  WBC 8.4 7.4  NEUTROABS  --  4.7  HGB 14.1 14.3  HCT 41.1 42.0  MCV 92.8 94.0  PLT 241 215   Recent Labs  Lab 03/16/19 1336 03/17/19 0516  NA 125* 131*  K 4.3 4.0  CL 90* 95*  CO2 22 25  GLUCOSE 108* 110*  BUN 13 10  CREATININE 0.64 0.63  CALCIUM 9.3 9.2    Lorin Glass, MD  [facility] 03/22/2019

## 2019-03-22 NOTE — Care Management Important Message (Signed)
Important Message  Patient Details  Name: [name] MRN: [id number] Date of Birth: Jun 03, 1945   Medicare Important Message Given:  Yes  Initial Medicare IM given by Patient Access Associate on 03/21/2019 at 9:58am.     [name] 03/22/2019, 8:18 AM

## 2019-03-23 LAB — COMPREHENSIVE METABOLIC PANEL
ALT: 10 U/L (ref 0–44)
AST: 18 U/L (ref 15–41)
Albumin: 3.6 g/dL (ref 3.5–5.0)
Alkaline Phosphatase: 59 U/L (ref 38–126)
Anion gap: 10 (ref 5–15)
BUN: 26 mg/dL — ABNORMAL HIGH (ref 8–23)
CO2: 26 mmol/L (ref 22–32)
Calcium: 9.8 mg/dL (ref 8.9–10.3)
Chloride: 97 mmol/L — ABNORMAL LOW (ref 98–111)
Creatinine, Ser: 0.68 mg/dL (ref 0.44–1.00)
GFR calc Af Amer: >60 mL/min (ref >60)
GFR calc non Af Amer: >60 mL/min (ref >60)
Glucose, Bld: 111 mg/dL — ABNORMAL HIGH (ref 70–99)
Potassium: 4.2 mmol/L (ref 3.5–5.1)
Sodium: 133 mmol/L — ABNORMAL LOW (ref 135–145)
Total Bilirubin: 0.5 mg/dL (ref 0.3–1.2)
Total Protein: 6.1 g/dL — ABNORMAL LOW (ref 6.5–8.1)

## 2019-03-23 LAB — CBC WITH DIFFERENTIAL/PLATELET
Abs Immature Granulocytes: 0.06 10*3/uL (ref 0.00–0.07)
Basophils Absolute: 0 10*3/uL (ref 0.0–0.1)
Basophils Relative: 1 %
Eosinophils Absolute: 0.2 10*3/uL (ref 0.0–0.5)
Eosinophils Relative: 3 %
HCT: 36 % (ref 36.0–46.0)
Hemoglobin: 12 g/dL (ref 12.0–15.0)
Immature Granulocytes: 1 %
Lymphocytes Relative: 23 %
Lymphs Abs: 1.6 10*3/uL (ref 0.7–4.0)
MCH: 31.4 pg (ref 26.0–34.0)
MCHC: 33.3 g/dL (ref 30.0–36.0)
MCV: 94.2 fL (ref 80.0–100.0)
Monocytes Absolute: 1 10*3/uL (ref 0.1–1.0)
Monocytes Relative: 15 %
Neutro Abs: 4 10*3/uL (ref 1.7–7.7)
Neutrophils Relative %: 57 %
Platelets: 228 10*3/uL (ref 150–400)
RBC: 3.82 MIL/uL — ABNORMAL LOW (ref 3.87–5.11)
RDW: 13.3 % (ref 11.5–15.5)
WBC: 6.9 10*3/uL (ref 4.0–10.5)
nRBC: 0 % (ref 0.0–0.2)

## 2019-03-23 MED ORDER — SENNOSIDES-DOCUSATE SODIUM 8.6-50 MG PO TABS: 1.0000 | ORAL_TABLET | Freq: Two times a day (BID) | ORAL | Status: DC

## 2019-03-23 MED ORDER — FLUOXETINE HCL 20 MG PO CAPS
20.0000 mg | ORAL_CAPSULE | Freq: Every day | ORAL | Status: DC
  Administered 2019-03-23: 20 mg via ORAL
  Filled 2019-03-23: qty 1

## 2019-03-23 MED ORDER — LEVOTHYROXINE SODIUM 112 MCG PO TABS: 112.0000 ug | ORAL_TABLET | Freq: Every day | ORAL | 0 refills | Status: DC

## 2019-03-23 MED ORDER — FLUOXETINE HCL 20 MG PO CAPS: 20.0000 mg | ORAL_CAPSULE | Freq: Every day | ORAL | 0 refills | Status: DC

## 2019-03-23 NOTE — Progress Notes (Signed)
Fellow colleague called Rufina Falco regarding anti-anxiety medication because patient was becoming increasingly agitated and uncooperative. Orders were placed for  IV ativan. Patient is now resting comfortably.  Will continue to monitor.  Phoebe Sharps N  03/23/2019 3:01 AM

## 2019-03-23 NOTE — Progress Notes (Signed)
[name]  A and O x 4 VSS. Pt tolerating diet well. No complaints of pain or nausea. IV removed intact, prescriptions given. Pt voices understanding of discharge instructions with no further questions. Pt discharged via wheelchair with axillary.   Allergies as of 03/23/2019      Reactions   Celecoxib    Other reaction(s): Other (qualifier value) Patient is allergic to [facility] and found that it caused heart failure. CHF   Pregabalin    Other reaction(s): Localized superficial swelling of skin      Medication List    TAKE these medications   albuterol 108 (90 Base) MCG/ACT inhaler Commonly known as: VENTOLIN HFA Inhale 2 puffs into the lungs every 6 (six) hours as needed for wheezing or shortness of breath.   alendronate 70 MG tablet Commonly known as: FOSAMAX Take 70 mg by mouth once a week.   aspirin 81 MG tablet Take 81 mg by mouth daily.   busPIRone 7.5 MG tablet Commonly known as: BUSPAR Take 7.5 mg by mouth 2 (two) times daily.   Calcium 500/D 500-200 MG-UNIT tablet Generic drug: calcium-vitamin D Take 1 tablet by mouth daily.   FLUoxetine 20 MG capsule Commonly known as: PROZAC Take 1 capsule (20 mg total) by mouth daily. What changed:   medication strength  how much to take   fluticasone 50 MCG/ACT nasal spray Commonly known as: FLONASE 2 sprays daily.   hydrocortisone 2.5 % rectal cream Commonly known as: ANUSOL-HC Place 1 application rectally 3 (three) times daily as needed for hemorrhoids or itching. Reported on 12/02/2015   levothyroxine 112 MCG tablet Commonly known as: SYNTHROID Take 1 tablet (112 mcg total) by mouth daily before breakfast. Start taking on: March 24, 2019 What changed:   medication strength  how much to take   LORazepam 0.5 MG tablet Commonly known as: ATIVAN Take 0.5 mg by mouth at bedtime.   lubiprostone 8 MCG capsule Commonly known as: AMITIZA Take 8 mcg by mouth 2 (two) times daily with a meal.   Melatonin 3 MG  Tabs Take 3 mg by mouth at bedtime.   metFORMIN 500 MG tablet Commonly known as: GLUCOPHAGE Take 500 mg by mouth daily with breakfast.   ondansetron 4 MG disintegrating tablet Commonly known as: Zofran ODT Take 1 tablet (4 mg total) by mouth every 8 (eight) hours as needed for nausea or vomiting.   Potassium 99 MG Tabs Take 1 tablet by mouth daily.   simvastatin 20 MG tablet Commonly known as: ZOCOR Take 20 mg by mouth daily at 6 PM.   Symbicort 160-4.5 MCG/ACT inhaler Generic drug: budesonide-formoterol Inhale 2 puffs into the lungs 2 (two) times daily.   tiotropium 18 MCG inhalation capsule Commonly known as: SPIRIVA Place 18 mcg into inhaler and inhale daily. Reported on 08/07/2015       Vitals:   03/23/19 1315 03/23/19 1315  BP: 125/69 125/69  Pulse: 79 81  Resp:    Temp: 99.5 F (37.5 C) 99.5 F (37.5 C)  SpO2:  96%    [name] [name]

## 2019-03-23 NOTE — Discharge Summary (Signed)
Physician Discharge Summary  [name] WFU:[id number] DOB: [birth year] DOA: 03/17/2019  PCP: Wilford Corner, PA-C  Admit date: 03/17/2019 Discharge date: 03/23/2019  Admitted From: Home Discharge disposition: Home with home health   Code Status: Full Code  Diet Recommendation: Diabetic diet   Recommendations for Outpatient Follow-Up:   1. Follow-up with outpatient psychiatry 2. Follow-up with PCP  Discharge Diagnosis:   Active Problems:   Confusion   Acute metabolic encephalopathy   Type 2 diabetes mellitus with hyperlipidemia (HCC)   Chronic obstructive pulmonary disease (HCC)   History of Present Illness / Brief narrative:  [name] is a 73 y.o. female with PMH of HTN, DM2, CHF, Graves' disease, PTSD, anxiety, fibromyalgia, IBS. Patient was brought to the ED on 03/17/2019 after a neighbor found her outside her house confused. She recently was hospitalized for confusion secondary to UTI and completed treatment with antibiotics. She was admitted under hospitalist medicine service for further evaluation and management. Psychiatry consultation was obtained.  Hospital Course:  Acute encephalopathy Acute psychotic symptoms -Recently treated for UTI. -CT scan of head normal. -TSH level was elevated to 17.  It is the only likely explanation of her psychotic symptoms at this time. -Psychiatry consultation appreciated.  Patient had paranoid ideation, visual hallucination and generalized disorganization of thought process.  She had waxing and waning pattern of her symptoms. -Per psychiatrist Dr. Cindi Carbon who had a conversation with patient primary care provider, patient did not have any history of dementia.  She has depression and has remained on Prozac 40 mg daily and Ativan 0.5 mg as needed. -Currently, Prozac is on hold in the hospital.  Patient is only on as needed Ativan. -Her mental status seems to have improved back to baseline. -Per my discussion with  psychiatrist Dr. Cindi Carbon this morning, will resume the patient on Prozac at a lower dose of 20 mg daily and discharge her home on the same dose to follow-up with her primary psychiatrist Dr. Georjean Mode.  Hyponatremia -Sodium level was low at 125 at presentation, improved to 133 today.  Hypothyroidism -Continue increased dose of Synthroid  Type 2 diabetes mellitus -Continue Metformin  Hyperlipidemia -continue statin.  COPD -Continue inhalers.  Stable for discharge home today. Subjective:  Seen and examined this morning.  Pleasant elderly Caucasian female.  Sitting up in chair.  Not in distress.  Alert awake oriented x3.  Able to have a normal conversation today.  I did not notice any bizarre thoughts like yesterday.  Per my discussion with psychiatrist Dr. Cindi Carbon this morning, will resume the patient on Prozac at a lower dose of 20 mg daily and discharge her home on the same dose to follow-up with her primary psychiatrist Dr. Georjean Mode.  Discharge Exam:   Vitals:   03/21/19 2019 03/22/19 0521 03/23/19 0544 03/23/19 1015  BP: 123/74 (!) 141/74 134/80 (!) 146/74  Pulse: 96 75 86 76  Resp: 20 20 18 16   Temp: 99.2 F (37.3 C) 98.4 F (36.9 C) 98.3 F (36.8 C) 99.1 F (37.3 C)  TempSrc: Oral Oral Oral Oral  SpO2: 97% 98% 97% 99%  Weight:      Height:        Body mass index is 18.53 kg/m.  General exam: Appears calm and comfortable.  Skin: No rashes, lesions or ulcers. HEENT: Atraumatic, normocephalic, supple neck, no obvious bleeding Lungs: Clear to auscultation bilaterally CVS: Regular rate and rhythm, no murmur GI/Abd soft, nontender, nondistended, bowel sound present CNS: Alert, awake, oriented x3  Psychiatry: Mood appropriate, no hallucination or delusion. Extremities: No pedal edema, no calf tenderness  Discharge Instructions:  Wound care: None Discharge Instructions    Increase activity slowly   Complete by: As directed       Allergies as of 03/23/2019       Reactions   Celecoxib    Other reaction(s): Other (qualifier value) Patient is allergic to [facility] and found that it caused heart failure. CHF   Pregabalin    Other reaction(s): Localized superficial swelling of skin      Medication List    TAKE these medications   albuterol 108 (90 Base) MCG/ACT inhaler Commonly known as: VENTOLIN HFA Inhale 2 puffs into the lungs every 6 (six) hours as needed for wheezing or shortness of breath.   alendronate 70 MG tablet Commonly known as: FOSAMAX Take 70 mg by mouth once a week.   aspirin 81 MG tablet Take 81 mg by mouth daily.   busPIRone 7.5 MG tablet Commonly known as: BUSPAR Take 7.5 mg by mouth 2 (two) times daily.   Calcium 500/D 500-200 MG-UNIT tablet Generic drug: calcium-vitamin D Take 1 tablet by mouth daily.   FLUoxetine 20 MG capsule Commonly known as: PROZAC Take 1 capsule (20 mg total) by mouth daily. What changed:   medication strength  how much to take   fluticasone 50 MCG/ACT nasal spray Commonly known as: FLONASE 2 sprays daily.   hydrocortisone 2.5 % rectal cream Commonly known as: ANUSOL-HC Place 1 application rectally 3 (three) times daily as needed for hemorrhoids or itching. Reported on 12/02/2015   levothyroxine 112 MCG tablet Commonly known as: SYNTHROID Take 1 tablet (112 mcg total) by mouth daily before breakfast. Start taking on: March 24, 2019 What changed:   medication strength  how much to take   LORazepam 0.5 MG tablet Commonly known as: ATIVAN Take 0.5 mg by mouth at bedtime.   lubiprostone 8 MCG capsule Commonly known as: AMITIZA Take 8 mcg by mouth 2 (two) times daily with a meal.   Melatonin 3 MG Tabs Take 3 mg by mouth at bedtime.   metFORMIN 500 MG tablet Commonly known as: GLUCOPHAGE Take 500 mg by mouth daily with breakfast.   ondansetron 4 MG disintegrating tablet Commonly known as: Zofran ODT Take 1 tablet (4 mg total) by mouth every 8 (eight) hours as needed for  nausea or vomiting.   Potassium 99 MG Tabs Take 1 tablet by mouth daily.   simvastatin 20 MG tablet Commonly known as: ZOCOR Take 20 mg by mouth daily at 6 PM.   Symbicort 160-4.5 MCG/ACT inhaler Generic drug: budesonide-formoterol Inhale 2 puffs into the lungs 2 (two) times daily.   tiotropium 18 MCG inhalation capsule Commonly known as: SPIRIVA Place 18 mcg into inhaler and inhale daily. Reported on 08/07/2015       Time coordinating discharge: 35 minutes  The results of significant diagnostics from this hospitalization (including imaging, microbiology, ancillary and laboratory) are listed below for reference.    Procedures and Diagnostic Studies:   Dg Chest 1 View  Result Date: 03/17/2019 CLINICAL DATA:  Confusion. EXAM: CHEST  1 VIEW COMPARISON:  03/16/2019. FINDINGS: EKG leads noted over the chest. Mediastinum and hilar structures normal. COPD. Mild bibasilar subsegmental atelectasis. No pleural effusion or pneumothorax. Heart size normal. No acute bony abnormality identified. Degenerative changes scoliosis thoracic spine. Carotid vascular calcification. IMPRESSION: 1.  COPD.  Mild bibasilar subsegmental atelectasis. 2.  Carotid vascular disease. Electronically Signed   By: Maisie Fus  Register   On: 03/17/2019 06:02   Ct Head Wo Contrast  Result Date: 03/17/2019 CLINICAL DATA:  Mental status change. EXAM: CT HEAD WITHOUT CONTRAST TECHNIQUE: Contiguous axial images were obtained from the base of the skull through the vertex without intravenous contrast. COMPARISON:  Head CT 03/09/2019 FINDINGS: Brain: The ventricles are in the midline without mass effect or shift. No extra-axial fluid collections are identified. The gray-white differentiation is maintained. No CT findings for acute intracranial process such as hemispheric infarction or intracranial hemorrhage. No mass lesions. The brainstem and cerebellum appear normal in stable. Vascular: No hyperdense vessels or aneurysm. Stable  vascular calcifications. Skull: No skull fracture or bone lesion. Hyperostosis frontalis interna. Sinuses/Orbits: The paranasal sinuses and mastoid air cells are clear. The globes are intact. Other: No scalp lesions or hematoma. IMPRESSION: No acute intracranial findings or mass lesion. Electronically Signed   By: Rudie MeyerP.  Gallerani M.D.   On: 03/17/2019 05:45   Mr Brain Wo Contrast  Result Date: 03/18/2019 CLINICAL DATA:  Encephalopathy EXAM: MRI HEAD WITHOUT CONTRAST TECHNIQUE: Multiplanar, multiecho pulse sequences of the brain and surrounding structures were obtained without intravenous contrast. COMPARISON:  None. FINDINGS: BRAIN: There is no acute infarct, acute hemorrhage or extra-axial collection. The white matter signal is normal for the patient's age. The cerebral and cerebellar volume are age-appropriate. There is no hydrocephalus. The midline structures are normal. VASCULAR: The major intracranial arterial and venous sinus flow voids are normal. Susceptibility-sensitive sequences show no chronic microhemorrhage or superficial siderosis. SKULL AND UPPER CERVICAL SPINE: Calvarial bone marrow signal is normal. There is no skull base mass. The visualized upper cervical spine and soft tissues are normal. SINUSES/ORBITS: There are no fluid levels or advanced mucosal thickening. The mastoid air cells and middle ear cavities are free of fluid. The orbits are normal. IMPRESSION: Normal aging brain. Electronically Signed   By: Deatra RobinsonKevin  Herman M.D.   On: 03/18/2019 06:25   Dg Abd Portable 1v  Result Date: 03/17/2019 CLINICAL DATA:  MRI clearance EXAM: PORTABLE ABDOMEN - 1 VIEW COMPARISON:  11/24/2016 pelvic radiograph FINDINGS: Nonobstructed bowel gas pattern with large volume of stool in the colon. Small surgical clips in the left pelvis as before. No metallic foreign bodies are visualized. IMPRESSION: 1. No significant metallic foreign body or objects over the abdomen or pelvis. 2. Nonobstructed gas pattern  with large amount of stool in the colon Electronically Signed   By: Jasmine PangKim  Fujinaga M.D.   On: 03/17/2019 20:53     Labs:   Basic Metabolic Panel: Recent Labs  Lab 03/17/19 0516 03/23/19 0533  NA 131* 133*  K 4.0 4.2  CL 95* 97*  CO2 25 26  GLUCOSE 110* 111*  BUN 10 26*  CREATININE 0.63 0.68  CALCIUM 9.2 9.8   GFR Estimated Creatinine Clearance: 49.2 mL/min (by C-G formula based on SCr of 0.68 mg/dL). Liver Function Tests: Recent Labs  Lab 03/17/19 0516 03/23/19 0533  AST 28 18  ALT 13 10  ALKPHOS 51 59  BILITOT 0.7 0.5  PROT 7.4 6.1*  ALBUMIN 4.6 3.6   No results for input(s): LIPASE, AMYLASE in the last 168 hours. Recent Labs  Lab 03/17/19 0604  AMMONIA 13   Coagulation profile No results for input(s): INR, PROTIME in the last 168 hours.  CBC: Recent Labs  Lab 03/17/19 0516 03/23/19 0533  WBC 7.4 6.9  NEUTROABS 4.7 4.0  HGB 14.3 12.0  HCT 42.0 36.0  MCV 94.0 94.2  PLT 215 228   Cardiac  Enzymes: Recent Labs  Lab 03/17/19 0604  CKTOTAL 82   BNP: Invalid input(s): POCBNP CBG: No results for input(s): GLUCAP in the last 168 hours. D-Dimer No results for input(s): DDIMER in the last 72 hours. Hgb A1c No results for input(s): HGBA1C in the last 72 hours. Lipid Profile No results for input(s): CHOL, HDL, LDLCALC, TRIG, CHOLHDL, LDLDIRECT in the last 72 hours. Thyroid function studies No results for input(s): TSH, T4TOTAL, T3FREE, THYROIDAB in the last 72 hours.  Invalid input(s): FREET3 Anemia work up No results for input(s): VITAMINB12, FOLATE, FERRITIN, TIBC, IRON, RETICCTPCT in the last 72 hours. Microbiology Recent Results (from the past 240 hour(s))  SARS CORONAVIRUS 2 (TAT 6-24 HRS) Nasopharyngeal Nasopharyngeal Swab     Status: None   Collection Time: 03/16/19  5:38 PM   Specimen: Nasopharyngeal Swab  Result Value Ref Range Status   SARS Coronavirus 2 NEGATIVE NEGATIVE Final    Comment: (NOTE) SARS-CoV-2 target nucleic acids are NOT  DETECTED. The SARS-CoV-2 RNA is generally detectable in upper and lower respiratory specimens during the acute phase of infection. Negative results do not preclude SARS-CoV-2 infection, do not rule out co-infections with other pathogens, and should not be used as the sole basis for treatment or other patient management decisions. Negative results must be combined with clinical observations, patient history, and epidemiological information. The expected result is Negative. Fact Sheet for Patients: HairSlick.no Fact Sheet for Healthcare Providers: quierodirigir.com This test is not yet approved or cleared by the [place] FDA and  has been authorized for detection and/or diagnosis of SARS-CoV-2 by FDA under an Emergency Use Authorization (EUA). This EUA will remain  in effect (meaning this test can be used) for the duration of the COVID-19 declaration under Section 56 4(b)(1) of the Act, 21 U.S.C. section 360bbb-3(b)(1), unless the authorization is terminated or revoked sooner. Performed at [facility], [address] [place], [place] [address]   SARS CORONAVIRUS 2 (TAT 6-24 HRS) Nasopharyngeal Nasopharyngeal Swab     Status: None   Collection Time: 03/17/19  3:45 PM   Specimen: Nasopharyngeal Swab  Result Value Ref Range Status   SARS Coronavirus 2 NEGATIVE NEGATIVE Final    Comment: (NOTE) SARS-CoV-2 target nucleic acids are NOT DETECTED. The SARS-CoV-2 RNA is generally detectable in upper and lower respiratory specimens during the acute phase of infection. Negative results do not preclude SARS-CoV-2 infection, do not rule out co-infections with other pathogens, and should not be used as the sole basis for treatment or other patient management decisions. Negative results must be combined with clinical observations, patient history, and epidemiological information. The expected result is Negative. Fact Sheet for  Patients: HairSlick.no Fact Sheet for Healthcare Providers: quierodirigir.com This test is not yet approved or cleared by the [place] FDA and  has been authorized for detection and/or diagnosis of SARS-CoV-2 by FDA under an Emergency Use Authorization (EUA). This EUA will remain  in effect (meaning this test can be used) for the duration of the COVID-19 declaration under Section 56 4(b)(1) of the Act, 21 U.S.C. section 360bbb-3(b)(1), unless the authorization is terminated or revoked sooner. Performed at [facility], [address], [place], [place] [address]   Urine Culture     Status: Abnormal   Collection Time: 03/19/19  4:25 PM   Specimen: Urine, Catheterized  Result Value Ref Range Status   Specimen Description   Final    URINE, CATHETERIZED Performed at [facility], [address]  [place], [place] [address]    Special Requests   Final    Normal Performed at [facility], [address] [place], [place] [address]    Culture (A)  Final    <10,000 COLONIES/mL INSIGNIFICANT GROWTH Performed at [facility] [address], [place], [place] [address]    Report Status 03/20/2019 FINAL  Final    Please note: You were cared for by a hospitalist during your hospital stay. Once you are discharged, your primary care physician will handle any further medical issues. Please note that NO REFILLS for any discharge medications will be authorized once you are discharged, as it is imperative that you return to your primary care physician (or establish a relationship with a primary care physician if you do not have one) for your post hospital discharge needs so that they can reassess your need for medications and monitor your lab values.  Signed: Terrilee Croak  [facility] 03/23/2019, 12:51 PM

## 2019-03-23 NOTE — TOC Transition Note (Signed)
Transition of Care [facility]) - CM/SW Discharge Note   Patient Details  Name: [name] MRN: [id number] Date of Birth: 11-22-1945  Transition of Care [facility]) CM/SW Contact:  Beverly Sessions, RN Phone Number: 03/23/2019, 6:48 PM   Clinical Narrative:    Patient discharge home today with home health services through [facility].  [place] with [employer] notified of discharge  [place] from [employer] provided transportation home.  He will contact patient's PCS services to resume  MD aware that patient will be discharging with out 24/7 care    Final next level of care: [place] Barriers to Discharge: Continued Medical Work up   Patient Goals and CMS Choice        Discharge Placement                       Discharge Plan and Services     Post Acute Care Choice: Resumption of Svcs/PTA Provider                    HH Arranged: RN [facility] Agency: [facility] [place] Date [place]: 03/23/19   Representative spoke with at [place]: [place] ([place]) Interventions     Readmission Risk Interventions No flowsheet data found.

## 2019-03-25 ENCOUNTER — Encounter: Payer: Self-pay | Admitting: Emergency Medicine

## 2019-03-25 ENCOUNTER — Other Ambulatory Visit: Payer: Self-pay

## 2019-03-25 ENCOUNTER — Emergency Department
Admission: EM | Admit: 2019-03-25 | Discharge: 2019-04-07 | Disposition: A | Discharge status: Home / Self Care | Payer: Medicare HMO | Attending: Emergency Medicine | Admitting: Emergency Medicine

## 2019-03-25 DIAGNOSIS — J449 Chronic obstructive pulmonary disease, unspecified: Secondary | ICD-10-CM | POA: Insufficient documentation

## 2019-03-25 DIAGNOSIS — E039 Hypothyroidism, unspecified: Secondary | ICD-10-CM | POA: Insufficient documentation

## 2019-03-25 DIAGNOSIS — E119 Type 2 diabetes mellitus without complications: Secondary | ICD-10-CM | POA: Diagnosis not present

## 2019-03-25 DIAGNOSIS — Z87891 Personal history of nicotine dependence: Secondary | ICD-10-CM | POA: Diagnosis not present

## 2019-03-25 DIAGNOSIS — Z03818 Encounter for observation for suspected exposure to other biological agents ruled out: Secondary | ICD-10-CM | POA: Diagnosis not present

## 2019-03-25 DIAGNOSIS — Z7982 Long term (current) use of aspirin: Secondary | ICD-10-CM | POA: Diagnosis not present

## 2019-03-25 DIAGNOSIS — Z20828 Contact with and (suspected) exposure to other viral communicable diseases: Secondary | ICD-10-CM | POA: Diagnosis not present

## 2019-03-25 DIAGNOSIS — Z79899 Other long term (current) drug therapy: Secondary | ICD-10-CM | POA: Diagnosis not present

## 2019-03-25 DIAGNOSIS — F329 Major depressive disorder, single episode, unspecified: Secondary | ICD-10-CM | POA: Insufficient documentation

## 2019-03-25 DIAGNOSIS — I10 Essential (primary) hypertension: Secondary | ICD-10-CM | POA: Diagnosis not present

## 2019-03-25 DIAGNOSIS — R1110 Vomiting, unspecified: Secondary | ICD-10-CM

## 2019-03-25 DIAGNOSIS — I110 Hypertensive heart disease with heart failure: Secondary | ICD-10-CM | POA: Insufficient documentation

## 2019-03-25 DIAGNOSIS — R451 Restlessness and agitation: Secondary | ICD-10-CM | POA: Diagnosis present

## 2019-03-25 DIAGNOSIS — I509 Heart failure, unspecified: Secondary | ICD-10-CM | POA: Insufficient documentation

## 2019-03-25 DIAGNOSIS — R69 Illness, unspecified: Secondary | ICD-10-CM | POA: Diagnosis not present

## 2019-03-25 DIAGNOSIS — R4689 Other symptoms and signs involving appearance and behavior: Secondary | ICD-10-CM | POA: Diagnosis not present

## 2019-03-25 DIAGNOSIS — Z7984 Long term (current) use of oral hypoglycemic drugs: Secondary | ICD-10-CM | POA: Insufficient documentation

## 2019-03-25 DIAGNOSIS — R462 Strange and inexplicable behavior: Secondary | ICD-10-CM | POA: Diagnosis not present

## 2019-03-25 DIAGNOSIS — F4310 Post-traumatic stress disorder, unspecified: Secondary | ICD-10-CM | POA: Diagnosis not present

## 2019-03-25 DIAGNOSIS — M4800 Spinal stenosis, site unspecified: Secondary | ICD-10-CM | POA: Diagnosis not present

## 2019-03-25 LAB — CBC WITH DIFFERENTIAL/PLATELET
Abs Immature Granulocytes: 0.07 10*3/uL (ref 0.00–0.07)
Basophils Absolute: 0.1 10*3/uL (ref 0.0–0.1)
Basophils Relative: 1 %
Eosinophils Absolute: 0.1 10*3/uL (ref 0.0–0.5)
Eosinophils Relative: 1 %
HCT: 38.8 % (ref 36.0–46.0)
Hemoglobin: 12.7 g/dL (ref 12.0–15.0)
Immature Granulocytes: 1 %
Lymphocytes Relative: 20 %
Lymphs Abs: 1.5 10*3/uL (ref 0.7–4.0)
MCH: 31.5 pg (ref 26.0–34.0)
MCHC: 32.7 g/dL (ref 30.0–36.0)
MCV: 96.3 fL (ref 80.0–100.0)
Monocytes Absolute: 0.7 10*3/uL (ref 0.1–1.0)
Monocytes Relative: 10 %
Neutro Abs: 5.1 10*3/uL (ref 1.7–7.7)
Neutrophils Relative %: 67 %
Platelets: 294 10*3/uL (ref 150–400)
RBC: 4.03 MIL/uL (ref 3.87–5.11)
RDW: 13.5 % (ref 11.5–15.5)
WBC: 7.5 10*3/uL (ref 4.0–10.5)
nRBC: 0 % (ref 0.0–0.2)

## 2019-03-25 LAB — COMPREHENSIVE METABOLIC PANEL
ALT: 12 U/L (ref 0–44)
AST: 23 U/L (ref 15–41)
Albumin: 4.4 g/dL (ref 3.5–5.0)
Alkaline Phosphatase: 49 U/L (ref 38–126)
Anion gap: 13 (ref 5–15)
BUN: 17 mg/dL (ref 8–23)
CO2: 23 mmol/L (ref 22–32)
Calcium: 9.3 mg/dL (ref 8.9–10.3)
Chloride: 98 mmol/L (ref 98–111)
Creatinine, Ser: 0.62 mg/dL (ref 0.44–1.00)
GFR calc Af Amer: >60 mL/min (ref >60)
GFR calc non Af Amer: >60 mL/min (ref >60)
Glucose, Bld: 138 mg/dL — ABNORMAL HIGH (ref 70–99)
Potassium: 4.2 mmol/L (ref 3.5–5.1)
Sodium: 134 mmol/L — ABNORMAL LOW (ref 135–145)
Total Bilirubin: 0.8 mg/dL (ref 0.3–1.2)
Total Protein: 7.3 g/dL (ref 6.5–8.1)

## 2019-03-25 LAB — ETHANOL: Alcohol, Ethyl (B): <10 mg/dL (ref <10)

## 2019-03-25 MED ORDER — LORAZEPAM 2 MG/ML IJ SOLN
2.0000 mg | Freq: Once | INTRAMUSCULAR | Status: DC
  Filled 2019-03-25: qty 1

## 2019-03-25 MED ORDER — LORAZEPAM 2 MG/ML IJ SOLN
2.0000 mg | Freq: Once | INTRAMUSCULAR | Status: AC
  Administered 2019-03-25: 2 mg via INTRAMUSCULAR

## 2019-03-25 NOTE — ED Notes (Signed)
Spoke with Dr. Archie Balboa, informed him that pt does not currently have an IV. Per Dr. Archie Balboa OK to give IM.

## 2019-03-25 NOTE — ED Notes (Signed)
Pt brought back to room 22, pt yelling "I'm [name]". RN entered room, pt laying in bed. Pt states that she is here because she is confused. Pt has multiple bruises on her left arm. Pt is A & O x 3. Pt yelling out intermittently

## 2019-03-25 NOTE — ED Triage Notes (Signed)
[place] police here with pt who is voluntary at this time, pt was picked up from her apartment complex where neighbors reported pt in the hallway yelling, police also think pt may have hit her life alert button and was told that she may have fallen, pt has bruising noted to her left wrist. At entrance of lobby patient yelled "welcome" then after speaking with police during registration process pt yelled unexpectedly "where's the clock?"

## 2019-03-25 NOTE — ED Provider Notes (Signed)
[facility] Emergency Department Provider Note  ____________________________________________   I have reviewed the triage vital signs and the nursing notes.   HISTORY  Chief Complaint Agitation   History limited by and level 5 caveat due to: AMS  HPI [name] is a 73 y.o. female who presents to the emergency department today because of concern for agitation. The patient cannot give any history. Is coming from living facility. Apparently in the past the patient has had similar presentation when she has been hyponatremic. The patient herself cannot give any history.     Records reviewed. Per medical record review patient has a history of hyponatremia, recent admission for AMS  Past Medical History:  Diagnosis Date  . Anxiety   . Chest pain   . CHF (congestive heart failure) (HCC)   . Depression   . Diabetes mellitus without complication (HCC)   . Fibromyalgia   . Graves disease   . Hypertension   . IBS (irritable bowel syndrome)   . PTSD (post-traumatic stress disorder)   . Sinus problem   . Spinal stenosis     Patient Active Problem List   Diagnosis Date Noted  . Acute metabolic encephalopathy 03/19/2019  . Type 2 diabetes mellitus with hyperlipidemia (HCC)   . Chronic obstructive pulmonary disease (HCC)   . Confusion 03/17/2019  . AMS (altered mental status) 03/12/2019  . Acute encephalopathy 03/11/2019  . Hypothyroidism 03/11/2019  . Altered mental status, unspecified 03/11/2019  . Orthostatic hypotension 01/12/2017  . Bronchitis 01/04/2017  . Unstable angina (HCC) 06/13/2015  . UTI (lower urinary tract infection) 06/13/2015  . Degenerative disc disease, lumbar 10/18/2014  . Sacroiliac joint dysfunction 10/18/2014  . Facet syndrome, lumbar 10/18/2014  . Cervical facet joint syndrome 10/18/2014  . Bilateral occipital neuralgia 10/18/2014  . Migraine 10/18/2014  . Neurosis, posttraumatic 10/08/2014  . Spinal stenosis   . PTSD  (post-traumatic stress disorder)     Past Surgical History:  Procedure Laterality Date  . ABDOMINAL HYSTERECTOMY    . ESOPHAGOGASTRODUODENOSCOPY (EGD) WITH PROPOFOL N/A 01/10/2015   Procedure: ESOPHAGOGASTRODUODENOSCOPY (EGD) WITH PROPOFOL;  Surgeon: Wallace Cullens, MD;  Location: [facility] ENDOSCOPY;  Service: Gastroenterology;  Laterality: N/A;  . RECTAL SURGERY    . SAVORY DILATION N/A 01/10/2015   Procedure: SAVORY DILATION;  Surgeon: Wallace Cullens, MD;  Location: [facility] ENDOSCOPY;  Service: Gastroenterology;  Laterality: N/A;  . toenail removal Bilateral    great toes  . TONSILLECTOMY AND ADENOIDECTOMY      Prior to Admission medications   Medication Sig Start Date End Date Taking? Authorizing Provider  albuterol (PROVENTIL HFA;VENTOLIN HFA) 108 (90 Base) MCG/ACT inhaler Inhale 2 puffs into the lungs every 6 (six) hours as needed for wheezing or shortness of breath. 01/04/17   Katha Hamming, MD  alendronate (FOSAMAX) 70 MG tablet Take 70 mg by mouth once a week. 01/11/19   [provider]  aspirin 81 MG tablet Take 81 mg by mouth daily.    [provider]  busPIRone (BUSPAR) 7.5 MG tablet Take 7.5 mg by mouth 2 (two) times daily.    [provider]  calcium-vitamin D (CALCIUM 500/D) 500-200 MG-UNIT tablet Take 1 tablet by mouth daily.    [provider]  FLUoxetine (PROZAC) 20 MG capsule Take 1 capsule (20 mg total) by mouth daily. 03/23/19 04/22/19  Lorin Glass, MD  fluticasone (FLONASE) 50 MCG/ACT nasal spray 2 sprays daily. 03/13/19   [provider]  hydrocortisone (ANUSOL-HC) 2.5 % rectal  cream Place 1 application rectally 3 (three) times daily as needed for hemorrhoids or itching. Reported on 12/02/2015    [provider]  levothyroxine (SYNTHROID) 112 MCG tablet Take 1 tablet (112 mcg total) by mouth daily before breakfast. 03/24/19 04/23/19  Dahal, Marlowe Aschoff, MD  LORazepam (ATIVAN) 0.5 MG tablet Take 0.5 mg by mouth at bedtime.    [provider]  lubiprostone (AMITIZA) 8 MCG capsule Take 8 mcg by mouth 2 (two) times daily with a meal.    [provider]  Melatonin 3 MG TABS Take 3 mg by mouth at bedtime.    [provider]  metFORMIN (GLUCOPHAGE) 500 MG tablet Take 500 mg by mouth daily with breakfast.     [provider]  ondansetron (ZOFRAN ODT) 4 MG disintegrating tablet Take 1 tablet (4 mg total) by mouth every 8 (eight) hours as needed for nausea or vomiting. 03/09/19   Earleen Newport, MD  Potassium 99 MG TABS Take 1 tablet by mouth daily.    [provider]  simvastatin (ZOCOR) 20 MG tablet Take 20 mg by mouth daily at 6 PM.    [provider]  SYMBICORT 160-4.5 MCG/ACT inhaler Inhale 2 puffs into the lungs 2 (two) times daily. 02/20/19   [provider]  tiotropium (SPIRIVA) 18 MCG inhalation capsule Place 18 mcg into inhaler and inhale daily. Reported on 08/07/2015    [provider]    Allergies Celecoxib and Pregabalin  Family History  Problem Relation Age of Onset  . Alcohol abuse Mother   . Depression Mother   . Varicose Veins Mother   . Alcohol abuse Father   . Depression Father   . Alcohol abuse Sister   . Asthma Sister   . COPD Sister   . Depression Sister   . Heart disease Sister   . Hypertension Sister   . Depression Maternal Grandmother   . Diabetes Paternal Grandmother   . Stroke Paternal Grandmother     Social History Social History   Tobacco Use  . Smoking status: Former Smoker    Packs/day: 0.50    Years: 25.00    Pack years: 12.50    Types: Cigarettes    Quit date: 06/03/2016    Years since quitting: 2.8  . Smokeless tobacco: Never Used  Substance Use Topics  . Alcohol use: No    Alcohol/week: 0.0 standard drinks  . Drug use: No    Review of Systems Unable to obtain secondary to AMS ____________________________________________   PHYSICAL EXAM:  VITAL SIGNS: ED Triage Vitals  Enc Vitals Group      BP 03/25/19 1728 (!) 139/91     Pulse Rate 03/25/19 1728 89     Resp 03/25/19 1728 20     Temp 03/25/19 1728 98.8 F (37.1 C)     Temp Source 03/25/19 1728 Oral     SpO2 03/25/19 1728 96 %     Weight 03/25/19 1730 120 lb (54.4 kg)     Height 03/25/19 1730 5\' 6"  (1.676 m)     Head Circumference --      Peak Flow --      Pain Score 03/25/19 1730 0   Constitutional: Awake and alert. Not completely oriented.  Eyes: Conjunctivae are normal.  ENT      Head: Normocephalic and atraumatic.      Nose: No congestion/rhinnorhea.      Mouth/Throat: Mucous membranes are moist.      Neck: No stridor. Hematological/Lymphatic/Immunilogical:  No cervical lymphadenopathy. Cardiovascular: Normal rate, regular rhythm.  No murmurs, rubs, or gallops.  Respiratory: Normal respiratory effort without tachypnea nor retractions. Breath sounds are clear and equal bilaterally. No wheezes/rales/rhonchi. Gastrointestinal: Soft and non tender. No rebound. No guarding.  Genitourinary: Deferred Musculoskeletal: Normal range of motion in all extremities. No lower extremity edema. Neurologic:  Awake and alert. Not oriented. Moving all extremities.  Skin:  Skin is warm, dry and intact. No rash noted. Psychiatric: Pressured speech. Tangential thought.   ____________________________________________    LABS (pertinent positives/negatives)  Ethanol <10 CBC wbc 7.5, hgb 12.7, plt 294 CMP wnl except na 134, glu 138  ____________________________________________   EKG  None  ____________________________________________    RADIOLOGY  None  ____________________________________________   PROCEDURES  Procedures  ____________________________________________   INITIAL IMPRESSION / ASSESSMENT AND PLAN / ED COURSE  Pertinent labs & imaging results that were available during my care of the patient were reviewed by me and considered in my medical decision making (see chart for details).   Patient presents  to the emergency department today because of concerns for abnormal behavior.  The patient did have a recent hospitalization for similar symptoms however that time appears it was thought to be due to hyponatremia and possible thyroid disease.  On exam here patient does have abnormal behavior.  Sodium levels normal. Will have psychiatry evaluate.   ____________________________________________   FINAL CLINICAL IMPRESSION(S) / ED DIAGNOSES  Final diagnoses:  Abnormal behavior     Note: This dictation was prepared with Dragon dictation. Any transcriptional errors that result from this process are unintentional     Phineas SemenGoodman, Alexea Blase, MD 03/25/19 2326

## 2019-03-25 NOTE — ED Triage Notes (Addendum)
Per officer patient lives by herself in an apartment, neighbors called because she was in hall yelling. Patient told officer she tried to call life alert but could not state for what reason. Patient randomly yells out words we have said but does not answer questions. Patient is not on IVC. During triage patient starts to answer most questions however also continues random yelling out of words. When asked if can control yelling, states she is not yelling.

## 2019-03-25 NOTE — ED Notes (Signed)
Pt. Appears pleasant and in good spirits.  Pt. Requested to use bathroom and was able to ambulate to bathroom with direction.

## 2019-03-26 DIAGNOSIS — R462 Strange and inexplicable behavior: Secondary | ICD-10-CM | POA: Diagnosis not present

## 2019-03-26 LAB — TSH: TSH: 8.304 u[IU]/mL — ABNORMAL HIGH (ref 0.350–4.500)

## 2019-03-26 MED ORDER — SIMVASTATIN 40 MG PO TABS
20.0000 mg | ORAL_TABLET | Freq: Every day | ORAL | Status: DC
  Administered 2019-03-26 – 2019-04-06 (×10): 20 mg via ORAL
  Filled 2019-03-26: qty 2
  Filled 2019-03-26 (×9): qty 1

## 2019-03-26 MED ORDER — POTASSIUM 99 MG PO TABS: 1.0000 | ORAL_TABLET | Freq: Every day | ORAL | Status: DC

## 2019-03-26 MED ORDER — TIOTROPIUM BROMIDE MONOHYDRATE 18 MCG IN CAPS
18.0000 ug | ORAL_CAPSULE | Freq: Every day | RESPIRATORY_TRACT | Status: DC
  Administered 2019-03-29 – 2019-04-07 (×8): 18 ug via RESPIRATORY_TRACT
  Filled 2019-03-26: qty 5

## 2019-03-26 MED ORDER — METFORMIN HCL 500 MG PO TABS
500.0000 mg | ORAL_TABLET | Freq: Every day | ORAL | Status: DC
  Administered 2019-03-27 – 2019-04-07 (×11): 500 mg via ORAL
  Filled 2019-03-26 (×11): qty 1

## 2019-03-26 MED ORDER — FLUTICASONE FUROATE-VILANTEROL 200-25 MCG/INH IN AEPB
1.0000 | INHALATION_SPRAY | Freq: Every day | RESPIRATORY_TRACT | Status: DC
  Administered 2019-03-29 – 2019-04-05 (×6): 1 via RESPIRATORY_TRACT
  Filled 2019-03-26: qty 28

## 2019-03-26 MED ORDER — LEVOTHYROXINE SODIUM 112 MCG PO TABS
112.0000 ug | ORAL_TABLET | Freq: Every day | ORAL | Status: DC
  Administered 2019-03-27 – 2019-04-07 (×11): 112 ug via ORAL
  Filled 2019-03-26 (×13): qty 1

## 2019-03-26 MED ORDER — OLANZAPINE 2.5 MG PO TABS
2.5000 mg | ORAL_TABLET | Freq: Every day | ORAL | Status: DC | PRN
  Administered 2019-03-26 – 2019-03-27 (×2): 2.5 mg via ORAL
  Filled 2019-03-26 (×3): qty 1

## 2019-03-26 MED ORDER — FLUOXETINE HCL 20 MG PO CAPS
20.0000 mg | ORAL_CAPSULE | Freq: Every day | ORAL | Status: DC
  Administered 2019-03-26: 20 mg via ORAL
  Filled 2019-03-26: qty 1

## 2019-03-26 MED ORDER — MELATONIN 5 MG PO TABS
2.5000 mg | ORAL_TABLET | Freq: Every day | ORAL | Status: DC
  Administered 2019-03-26 – 2019-04-01 (×6): 2.5 mg via ORAL
  Filled 2019-03-26 (×14): qty 0.5

## 2019-03-26 MED ORDER — BUSPIRONE HCL 15 MG PO TABS
7.5000 mg | ORAL_TABLET | Freq: Two times a day (BID) | ORAL | Status: DC
  Administered 2019-03-26: 7.5 mg via ORAL
  Filled 2019-03-26: qty 1
  Filled 2019-03-26: qty 2

## 2019-03-26 MED ORDER — LUBIPROSTONE 8 MCG PO CAPS
8.0000 ug | ORAL_CAPSULE | Freq: Two times a day (BID) | ORAL | Status: DC
  Administered 2019-03-26 – 2019-04-07 (×22): 8 ug via ORAL
  Filled 2019-03-26 (×28): qty 1

## 2019-03-26 MED ORDER — ALBUTEROL SULFATE (2.5 MG/3ML) 0.083% IN NEBU
2.5000 mg | INHALATION_SOLUTION | Freq: Four times a day (QID) | RESPIRATORY_TRACT | Status: DC | PRN
  Filled 2019-03-26: qty 3

## 2019-03-26 MED ORDER — LORAZEPAM 0.5 MG PO TABS
0.5000 mg | ORAL_TABLET | Freq: Every day | ORAL | Status: DC
  Administered 2019-03-26 – 2019-04-06 (×12): 0.5 mg via ORAL
  Filled 2019-03-26 (×12): qty 1

## 2019-03-26 MED ORDER — ONDANSETRON 4 MG PO TBDP
4.0000 mg | ORAL_TABLET | Freq: Three times a day (TID) | ORAL | Status: DC | PRN
  Administered 2019-04-01 – 2019-04-04 (×4): 4 mg via ORAL
  Filled 2019-03-26 (×9): qty 1

## 2019-03-26 NOTE — ED Notes (Signed)
Report to include Situation, Background, Assessment, and Recommendations received from [name]. Patient alert, warm and dry, in no acute distress. Patient will not respond concerning SI, HI, AVH and pain. Patient made aware of Q15 minute rounds and Engineer, drilling presence for their safety. Patient instructed to come to me with needs or concerns.

## 2019-03-26 NOTE — ED Notes (Addendum)
Per SOC, pt to be d/c. Pt very agitate. Yelling "Loribeth Katich" repetitively without warrant. Unable to satisfy needs due to pt not stating specific needs. Gust Rung NP notified to reevaluate pt

## 2019-03-26 NOTE — ED Notes (Signed)
Pt transferred into ED BHU  Room 5  Patient assigned to appropriate care area. Patient oriented to unit/care area: Informed that, for her safety, care areas are designed for safety and monitored by security cameras at all times; Visiting hours and phone times explained to patient. Patient verbalizes understanding, and verbal contract for safety obtained.   Assessment completed  She is voluntary   She denies pain

## 2019-03-26 NOTE — ED Notes (Signed)
Called [phone] [name])  per pt request. Pt reports its her aide. [place] unable to transport pt home today. Unable to reach daughter when called.

## 2019-03-26 NOTE — Progress Notes (Addendum)
Patient was scheduled to leave but making multiple demands for self care and her caregiver does not come to her house until tomorrow.  Agitated and PRN needed.  She will stay until tomorrow when she will have care at her home for safety concerns.  Patient has been here every weekend for the past three weeks related to her lack of in-home care on the weekends.   Waylan Boga, PMHNP

## 2019-03-26 NOTE — ED Notes (Signed)
Pt. Woke up and requested and was given cranberry juice and crackers.  Patients bed adjusted for comfort.

## 2019-03-26 NOTE — ED Notes (Signed)
Pt. Appears more alert and not yelling out.  Pt. Ok to talk to [facility].

## 2019-03-26 NOTE — ED Notes (Signed)
Hourly rounding reveals patient in room intermittently yelling out. Redirection minimally effective. No complaints, stable, in no acute distress. Q15 minute rounds and monitoring via Engineer, drilling to continue.

## 2019-03-26 NOTE — ED Provider Notes (Signed)
-----------------------------------------   7:31 AM on 03/26/2019 -----------------------------------------   Blood pressure (!) 157/72, pulse 88, temperature 98.4 F (36.9 C), temperature source Oral, resp. rate 18, height 5\' 6"  (1.676 m), weight 54.4 kg, SpO2 96 %.  The patient is calm and cooperative at this time.  There have been no acute events since the last update.  Awaiting disposition plan from Behavioral Medicine and/or Social Work team(s).    Nena Polio, MD 03/26/19 [phone]

## 2019-03-26 NOTE — ED Notes (Signed)

## 2019-03-26 NOTE — ED Notes (Signed)
Pt given meal tray.

## 2019-03-26 NOTE — ED Notes (Signed)
Hourly rounding reveals patient in room. No complaints, stable, in no acute distress. Q15 minute rounds and monitoring via Rover and Officer to continue.   

## 2019-03-26 NOTE — ED Notes (Signed)
Pt ambulatory to bathroom, hand hygiene preformed.  

## 2019-03-26 NOTE — ED Notes (Signed)
BEHAVIORAL HEALTH ROUNDING Patient sleeping: No. Patient alert and oriented: yes Behavior appropriate: Yes.  ; If no, describe:  Nutrition and fluids offered: yes Toileting and hygiene offered: Yes  Sitter present: q15 minute observations and security camera monitoring   

## 2019-03-26 NOTE — ED Notes (Signed)
ED Is the patient under IVC or is there intent for IVC:  voluntary Is the patient medically cleared: Yes.   Is there vacancy in the ED BHU: Yes.   Is the population mix appropriate for patient: Yes.   Is the patient awaiting placement in inpatient or outpatient setting: Yes.   Has the patient had a psychiatric consult: Yes.   Survey of unit performed for contraband, proper placement and condition of furniture, tampering with fixtures in bathroom, shower, and each patient room: Yes.  ; Findings:  APPEARANCE/BEHAVIOR  cooperative - anxious   NEURO ASSESSMENT Orientation: oriented x3  Denies pain Hallucinations: No.None noted (Hallucinations) denies  Speech: Normal Gait: normal RESPIRATORY ASSESSMENT Even  Unlabored respirations  CARDIOVASCULAR ASSESSMENT Pulses equal   regular rate  Skin warm and dry   GASTROINTESTINAL ASSESSMENT no GI complaint EXTREMITIES Full ROM  PLAN OF CARE Provide calm/safe environment. Vital signs assessed twice daily. ED BHU Assessment once each 12-hour shift. Collaborate with TTS daily or as condition indicates. Assure the ED provider has rounded once each shift. Provide and encourage hygiene. Provide redirection as needed. Assess for escalating behavior; address immediately and inform ED provider.  Assess family dynamic and appropriateness for visitation as needed: Yes.  ; If necessary, describe findings:  Educate the patient/family about BHU procedures/visitation: Yes.  ; If necessary, describe findings:

## 2019-03-26 NOTE — ED Notes (Signed)
Aide reports unable to pick patient up. Unable to give additional contacts to call for transport.

## 2019-03-26 NOTE — ED Notes (Signed)
Pt yelling out. "[name]" when walking into room pt states wants light off. When I turned the light off pt yells "[name]", "TURN THE LIGHT ON".  After multiple contradictory requests, light has been turned off. Pt Cooperative and calm however yells for this RN as soon as he leave bedside. Unable to obtain ride home at this time. Brandy RN aware.

## 2019-03-26 NOTE — ED Notes (Signed)
She has ambulated to and from the BR - she is now sitting in the chair of her room  NAD observed

## 2019-03-26 NOTE — ED Notes (Signed)
Pt given lunch tray.

## 2019-03-26 NOTE — ED Notes (Signed)
Report given to [facility]. To call daughter of patient for history per [facility].

## 2019-03-26 NOTE — ED Notes (Signed)
Report received from previous EDT Scott, pt currently sitting on edge of the bed screaming very loudly, this tech unable to make out what pt is screaming. Will continue to monitor with q 15 min checks

## 2019-03-27 DIAGNOSIS — R4689 Other symptoms and signs involving appearance and behavior: Secondary | ICD-10-CM | POA: Diagnosis not present

## 2019-03-27 DIAGNOSIS — R462 Strange and inexplicable behavior: Secondary | ICD-10-CM | POA: Diagnosis not present

## 2019-03-27 LAB — T4, FREE: Free T4: 0.79 ng/dL (ref 0.61–1.12)

## 2019-03-27 LAB — SARS CORONAVIRUS 2 (TAT 6-24 HRS): SARS Coronavirus 2: NEGATIVE

## 2019-03-27 MED ORDER — LORAZEPAM 2 MG/ML IJ SOLN
1.0000 mg | Freq: Once | INTRAMUSCULAR | Status: AC
  Administered 2019-03-27: 1 mg via INTRAMUSCULAR
  Filled 2019-03-27: qty 1

## 2019-03-27 MED ORDER — OLANZAPINE 10 MG IM SOLR
2.5000 mg | Freq: Once | INTRAMUSCULAR | Status: AC
  Administered 2019-03-27: 2.5 mg via INTRAMUSCULAR
  Filled 2019-03-27: qty 10

## 2019-03-27 MED ORDER — OLANZAPINE 2.5 MG PO TABS
2.5000 mg | ORAL_TABLET | Freq: Four times a day (QID) | ORAL | Status: DC | PRN
  Administered 2019-03-27 – 2019-03-30 (×7): 2.5 mg via ORAL
  Filled 2019-03-27 (×10): qty 1

## 2019-03-27 MED ORDER — HALOPERIDOL LACTATE 5 MG/ML IJ SOLN
5.0000 mg | Freq: Once | INTRAMUSCULAR | Status: AC
  Administered 2019-03-27: 5 mg via INTRAMUSCULAR
  Filled 2019-03-27: qty 1

## 2019-03-27 NOTE — ED Notes (Signed)
Patient able to eat 30% breakfast

## 2019-03-27 NOTE — ED Notes (Signed)
Patient able to eat 75% lunch

## 2019-03-27 NOTE — ED Notes (Addendum)
Patient is yelling at this moment, she is yelling Gary, red light. Writer went to see patient and patient came out of room and attempted to go into other peoples room, Probation officer and officer redirected her back to her room.

## 2019-03-27 NOTE — ED Notes (Signed)
Pt allowed IM medication to be given with no difficulty. Will monitor. Still yelling out "red light Dominica Severin".

## 2019-03-27 NOTE — ED Notes (Signed)
Hourly rounding reveals patient sleeping in room. No complaints, stable, in no acute distress. Q15 minute rounds and monitoring via Security Cameras to continue. 

## 2019-03-27 NOTE — ED Notes (Signed)
Attempted to call patients daughter Concha Se [phone], left a HIPAA compliant message with a female that answered the number

## 2019-03-27 NOTE — Consult Note (Signed)
[facility] Initial Consultation Note   Patient Name: [name]    ZOX:[id number] PCP: Wilford Corner, PA-C     DOB: 10-31-1945  DOA: 03/25/2019 DOS: the patient was seen and examined on 03/27/2019   Referring physician: Dr. Fuller Plan, Dr. Viviano Simas Reason for consult: Ongoing psychosis  HPI: [name] is a 73 y.o. female with Past medical history of depression, fibromyalgia, hyperthyroidism/Graves' disease now hypothyroidism, HTN, PTSD, anxiety, type 2 diabetes mellitus, hyperlipidemia, COPD, HFpEF. Patient is coming from Home.  [place] police actually brought the patient as apartment complex neighbors reported that the patient was yelling in the hallway there is also report of possible fall with some bruising on her left wrist. History was taken family from chart review as the patient is unable to provide consent for neurological events. Patient was recently hospitalized between 03/11/2019-03/14/2019 for hypothyroidism as well as hyponatremia and acute confusion.  Patient's sodium level were corrected with fluid restriction.  TSH was 12.5.  Patient was on 88 mcg of levothyroxine which was increased 200 mcg and patient was referred to PCP. Patient had a repeat admission between 03/17/2019-03/23/2019.  TSH level on that admission was elevated to 17.  Sodium level is 125 and improved to 133.  Likely patient was not taking her medications from prior discharge based on my discussion with the discharging provider.  Patient was evaluated by psychiatry and medications were adjusted. Physical therapy and Occupational Therapy recommended 24-hour supervision for the patient.  Patient discharged home with home health. At the time of my evaluation patient reported having some headache but then on further discussion with the patient was nonspecific.  She denies any dizziness denies any lightheadedness.  He denies any vision changes.  She says yes to pretty much all review of system questions  other than shortness of breath which she denies. She did tolerate her oral diet since she is in the ER and no vomiting has been reported.  No diarrhea reported as well.  Urinating well without any complication.  No fall ever since she has been in the ER.  Review of Systems: as mentioned in the history of present illness.  All other systems reviewed and are negative.  Past Medical History:  Diagnosis Date  . Anxiety   . Chest pain   . CHF (congestive heart failure) (HCC)   . Depression   . Diabetes mellitus without complication (HCC)   . Fibromyalgia   . Graves disease   . Hypertension   . IBS (irritable bowel syndrome)   . PTSD (post-traumatic stress disorder)   . Sinus problem   . Spinal stenosis    Past Surgical History:  Procedure Laterality Date  . ABDOMINAL HYSTERECTOMY    . ESOPHAGOGASTRODUODENOSCOPY (EGD) WITH PROPOFOL N/A 01/10/2015   Procedure: ESOPHAGOGASTRODUODENOSCOPY (EGD) WITH PROPOFOL;  Surgeon: Wallace Cullens, MD;  Location: [facility] ENDOSCOPY;  Service: Gastroenterology;  Laterality: N/A;  . RECTAL SURGERY    . SAVORY DILATION N/A 01/10/2015   Procedure: SAVORY DILATION;  Surgeon: Wallace Cullens, MD;  Location: [facility] ENDOSCOPY;  Service: Gastroenterology;  Laterality: N/A;  . toenail removal Bilateral    great toes  . TONSILLECTOMY AND ADENOIDECTOMY     Social History:  reports that she quit smoking about 2 years ago. Her smoking use included cigarettes. She has a 12.50 pack-year smoking history. She has never used smokeless tobacco. She reports that she does not drink alcohol or use drugs.  Allergies  Allergen Reactions  . Celecoxib  Other reaction(s): Other (qualifier value) Patient is allergic to [facility] and found that it caused heart failure. CHF  . Pregabalin     Other reaction(s): Localized superficial swelling of skin   Family history reviewed and not pertinent Family History  Problem Relation Age of Onset  . Alcohol abuse Mother   . Depression Mother   .  Varicose Veins Mother   . Alcohol abuse Father   . Depression Father   . Alcohol abuse Sister   . Asthma Sister   . COPD Sister   . Depression Sister   . Heart disease Sister   . Hypertension Sister   . Depression Maternal Grandmother   . Diabetes Paternal Grandmother   . Stroke Paternal Grandmother     Prior to Admission medications   Medication Sig Start Date End Date Taking? Authorizing Provider  albuterol (PROVENTIL HFA;VENTOLIN HFA) 108 (90 Base) MCG/ACT inhaler Inhale 2 puffs into the lungs every 6 (six) hours as needed for wheezing or shortness of breath. 01/04/17  Yes Katha HammingKonidena, Snehalatha, MD  alendronate (FOSAMAX) 70 MG tablet Take 70 mg by mouth once a week. 01/11/19  Yes [provider]  aspirin 81 MG tablet Take 81 mg by mouth daily.   Yes [provider]  busPIRone (BUSPAR) 7.5 MG tablet Take 7.5 mg by mouth 2 (two) times daily.   Yes [provider]  calcium-vitamin D (CALCIUM 500/D) 500-200 MG-UNIT tablet Take 1 tablet by mouth daily.   Yes [provider]  FLUoxetine (PROZAC) 20 MG capsule Take 1 capsule (20 mg total) by mouth daily. 03/23/19 04/22/19 Yes Dahal, Melina SchoolsBinaya, MD  fluticasone (FLONASE) 50 MCG/ACT nasal spray 2 sprays daily. 03/13/19  Yes [provider]  hydrocortisone (ANUSOL-HC) 2.5 % rectal cream Place 1 application rectally 3 (three) times daily as needed for hemorrhoids or itching. Reported on 12/02/2015   Yes [provider]  levothyroxine (SYNTHROID) 112 MCG tablet Take 1 tablet (112 mcg total) by mouth daily before breakfast. 03/24/19 04/23/19 Yes Dahal, Melina SchoolsBinaya, MD  LORazepam (ATIVAN) 0.5 MG tablet Take 0.5 mg by mouth at bedtime.   Yes [provider]  lubiprostone (AMITIZA) 8 MCG capsule Take 8 mcg by mouth 2 (two) times daily with a meal.   Yes [provider]  Melatonin 3 MG TABS Take 3 mg by mouth at bedtime.   Yes [provider]  metFORMIN (GLUCOPHAGE) 500 MG tablet Take 500  mg by mouth daily with breakfast.    Yes [provider]  ondansetron (ZOFRAN ODT) 4 MG disintegrating tablet Take 1 tablet (4 mg total) by mouth every 8 (eight) hours as needed for nausea or vomiting. 03/09/19  Yes Emily FilbertWilliams, Jonathan E, MD  Potassium 99 MG TABS Take 1 tablet by mouth daily.   Yes [provider]  simvastatin (ZOCOR) 20 MG tablet Take 20 mg by mouth daily at 6 PM.   Yes [provider]  SYMBICORT 160-4.5 MCG/ACT inhaler Inhale 2 puffs into the lungs 2 (two) times daily. 02/20/19  Yes [provider]  tiotropium (SPIRIVA) 18 MCG inhalation capsule Place 18 mcg into inhaler and inhale daily. Reported on 08/07/2015   Yes [provider]    Physical Exam: Vitals:   03/25/19 1728 03/25/19 1730 03/26/19 0456 03/26/19 1744  BP: (!) 139/91  (!) 157/72 (!) 154/68  Pulse: 89  88 95  Resp: 20  18 18   Temp: 98.8 F (37.1 C)  98.4 F (36.9 C)   TempSrc: Oral  Oral  SpO2: 96%  96% 99%  Weight:  54.4 kg    Height:  5\' 6"  (1.676 m)     General: alert and oriented to time, place, and person. Appear in mild distress, affect emotionally labile Eyes: PERRL, Conjunctiva normal ENT: Oral Mucosa Clear, moist  Neck: no JVD, no Abnormal Mass Or lumps Cardiovascular: S1 and S2 Present, no Murmur, peripheral pulses symmetrical Respiratory: normal respiratory effort, Bilateral Air entry equal and Decreased, no use of accessory muscle, Clear to Auscultation, no Crackles, no wheezes Abdomen: Bowel Sound present, Soft and no tenderness, no hernia Skin: no rashes  Extremities: no Pedal edema, no calf tenderness Neurologic: without any new focal findings, mental status, alert and oriented x3, speech normal, PERLA, Motor strength 5/5 and symmetric, Reflex difficult to assess and No tremors identified, no asterixis identified. Gait not checked due to patient safety concerns   Labs:  CBC: Recent Labs  Lab 03/23/19 0533 03/25/19 1737  WBC 6.9 7.5   NEUTROABS 4.0 5.1  HGB 12.0 12.7  HCT 36.0 38.8  MCV 94.2 96.3  PLT 228 409   Basic Metabolic Panel: Recent Labs  Lab 03/23/19 0533 03/25/19 1737  NA 133* 134*  K 4.2 4.2  CL 97* 98  CO2 26 23  GLUCOSE 111* 138*  BUN 26* 17  CREATININE 0.68 0.62  CALCIUM 9.8 9.3   Liver Function Tests: Recent Labs  Lab 03/23/19 0533 03/25/19 1737  AST 18 23  ALT 10 12  ALKPHOS 59 49  BILITOT 0.5 0.8  PROT 6.1* 7.3  ALBUMIN 3.6 4.4   No results for input(s): LIPASE, AMYLASE in the last 168 hours. No results for input(s): AMMONIA in the last 168 hours.  Cardiac Enzymes: No results for input(s): CKTOTAL, CKMB, CKMBINDEX, TROPONINI in the last 168 hours. No results for input(s): PROBNP in the last 8760 hours.  CBG: No results for input(s): GLUCAP in the last 168 hours.  Radiological Exams: No results found.  Assessment/Plan 1.  Ongoing subacute psychosis Since March 11, 2019 patient has frequently been in the hospital for acute metabolic encephalopathy. Primary driving etiology is hypothyroidism for which patient was started on higher dose of Synthroid and currently her TSH is appearing to be improving. Patient also had hyponatremia which is also corrected. SARS Covid test is negative. CBC this visit is unremarkable. CMP is also unremarkable. No electrolyte abnormality. Cortisol level was 10. Hemoglobin A1c 5.8. UDS was negative. Alcohol level was negative. Salicylate level and Tylenol level were negative. CK level was normal. Ammonia level was negative as well. Based on the lab work as well as clinical examination patient does not appear to be severely malnourished. Does not have any focal deficit at the time of my evaluation. MRI brain as well as CT scan of the head unremarkable for any other acute abnormality other than chronic atrophy. EEG is not performed although do not suspect that the patient is suffering from post ictal state  As she remains with her mild  psychotic symptoms and has not had any seizure-like event here in the hospital. Also nonconvulsive status epilepticus is less likely since the patient is able to answer respond to all the questions with normal neurological examination. At present the concern is that the patient continues to remain disoriented and is at risk for medical noncompliance. She lives alone and clearly appears to be not able to take care of herself. While the patient does not appear to have any medical reason for inpatient admission patient remains unsafe  to be discharged home alone without any assistance. Social worker PT OT reconsulted. Appreciate psychiatric assistance. As far as myxedema psychosis goes I was able to find only 1 case report for myxedema psychosis in recent literature in which patients TSH level were 306 and free T4 was 0.2.  Other research articles were from 1980s. Regardless patient is adequately supplemented with oral Synthroid I do not think that this level of TSH requires any different intervention. Patient may benefit from some multivitamin tablet which I will add. Currently transition back to EDP and psychiatry  Family Communication: None at bedside Primary team communication: Discussed with psychiatry as well as EDP both personally.  Thank you very much for involving [place] in care of your patient.  We will sign off at present, please call us again as needed.   Author: Lynden Oxford, MD [facility] 03/27/2019 12:34 PM    If 7PM-7AM, please contact night-coverage [email or web address]

## 2019-03-27 NOTE — Evaluation (Signed)
Physical Therapy Evaluation Patient Details Name: [name] MRN: [id number] DOB: 24-Jan-1946 Today's Date: 03/27/2019   History of Present Illness  presented to ER for AMS, management of subacute psychosis.  Recently hospitalized (and familiar to Clinical research associate) due to metabolic encephalopathy.  Clinical Impression  Patient sitting edge of bed, eating salt from pretzels upon arrival to room.  Greets therapists and acknowledges presence.  Verbalizes name (and orientation to self), but demonstrates noted difficulty with expressive speech.  Words clear and articulate, but all responses very echolalic.  Bilat UE/LE strength and ROM grossly symmetrical and WFL; no focal weakness appreciated.  Able to complete bed mobility indep; sit/stand, basic transfers and gait (100' x2) without assist device, mod indep.  Demonstrates reciprocal stepping pattern with good step height/length; good balance.  Able to start/stop, change directions and navigate all environments without LOB. Patient with significant cognitive impairment; unable to demonstrate appropriate insight, safety awareness or problem solving abilities necessary to facilitate safe discharge home alone.  Unsafe to discharge home without strict 24/7 supervision. However, as patient mobilizing without assist device at mod indep level of ability (baseline for patient), no acute PT needs identified.  Will complete initial PT order; please re-consult should needs change.     Follow Up Recommendations No PT follow up;Supervision/Assistance - 24 hour    Equipment Recommendations       Recommendations for Other Services       Precautions / Restrictions Precautions Precautions: Fall Restrictions Weight Bearing Restrictions: No      Mobility  Bed Mobility Overal bed mobility: Independent                Transfers Overall transfer level: Modified independent Equipment used: None             General transfer comment: sit/stand from  various surface heights (bed, standard chair) without assist device, mod indep; good LE strength/stability  Ambulation/Gait Ambulation/Gait assistance: Modified independent (Device/Increase time) Gait Distance (Feet): (75-100' x2) Assistive device: None       General Gait Details: reciprocal stepping pattern with good step height/length; good balance.  Able to start/stop, change directions and navigate all environments without LOB or safety concern.  Stairs            Wheelchair Mobility    Modified Rankin (Stroke Patients Only)       Balance Overall balance assessment: Modified Independent                                           Pertinent Vitals/Pain Pain Assessment: No/denies pain    Home Living Family/patient expects to be discharged to:: Private residence Living Arrangements: Alone Available Help at Discharge: Personal care attendant;Available PRN/intermittently Type of Home: House       Home Layout: One level Home Equipment: Cane - single point;Grab bars - toilet;Grab bars - tub/shower;Hand held shower head;Bedside commode      Prior Function Level of Independence: Independent;Needs assistance      ADL's / Homemaking Assistance Needed: indep with ADL, aides could help with bathing if needed, pt prepares simple meals also gets Meals on Wheels, aides assist with cleaning, groceries, and foot care as well as reminders to take medications as well as providing supervision as pt sets up her medication for a week at a time  Comments: Indep with ADLs, household mobilization; has SPC if needed.     Hand  Dominance   Dominant Hand: Right    Extremity/Trunk Assessment   Upper Extremity Assessment Upper Extremity Assessment: Overall WFL for tasks assessed    Lower Extremity Assessment Lower Extremity Assessment: Overall WFL for tasks assessed       Communication   Communication: (no difficulty with speech in itself; however,  communication very echolalic.  Unable to initiate structured conversation or answer questions directly.)  Cognition Arousal/Alertness: Awake/alert Behavior During Therapy: Impulsive Overall Cognitive Status: No family/caregiver present to determine baseline cognitive functioning                                 General Comments: oriented to self only; limited ability to follow commands, but responds well to environment, gestures and facilitation.  Pleasant and cooperative.      General Comments General comments (skin integrity, edema, etc.): easily integrates dynamic balance components into transfers/gait; spontaneously retrieves item from floor without difficulty    Exercises     Assessment/Plan    PT Assessment Patent does not need any further PT services  PT Problem List         PT Treatment Interventions      PT Goals (Current goals can be found in the Care Plan section)  Acute Rehab PT Goals PT Goal Formulation: Patient unable to participate in goal setting Time For Goal Achievement: 03/27/19 Potential to Achieve Goals: Fair    Frequency     Barriers to discharge        Co-evaluation               AM-PAC PT "6 Clicks" Mobility  Outcome Measure Help needed turning from your back to your side while in a flat bed without using bedrails?: None Help needed moving from lying on your back to sitting on the side of a flat bed without using bedrails?: None Help needed moving to and from a bed to a chair (including a wheelchair)?: None Help needed standing up from a chair using your arms (e.g., wheelchair or bedside chair)?: None Help needed to walk in hospital room?: None Help needed climbing 3-5 steps with a railing? : None 6 Click Score: 24    End of Session   Activity Tolerance: Patient tolerated treatment well Patient left: in bed;with call bell/phone within reach Nurse Communication: Mobility status PT Visit Diagnosis: Difficulty in walking,  not elsewhere classified (R26.2);Muscle weakness (generalized) (M62.81)    Time: 9381-0175 PT Time Calculation (min) (ACUTE ONLY): 9 min   Charges:   PT Evaluation $PT Eval Low Complexity: 1 Low          Keyaan Lederman H. Owens Shark, PT, DPT, NCS 03/27/19, 4:59 PM [phone]

## 2019-03-27 NOTE — ED Notes (Signed)
Pt laying on her bed but continues to yell out occasionally but not as often or as loud as earlier.

## 2019-03-27 NOTE — ED Notes (Signed)
Patient hollering "red light". Now asking for a blanket. Will give blanket and continue to monitor.

## 2019-03-27 NOTE — ED Notes (Signed)
Report to include Situation, Background, Assessment, and Recommendations received from Jadeka RN. Patient alert and oriented, warm and dry, in no acute distress. Patient denies SI, HI, AVH and pain. Patient made aware of Q15 minute rounds and security cameras for their safety. Patient instructed to come to me with needs or concerns. 

## 2019-03-27 NOTE — ED Notes (Signed)
Patient has been walking in the empty rooms in the [place] area, and has been escorted out by staff, she is currently in another patients room and is attempting to sit on his bed, he is in the dayroom, patient asked by staff to leave patients room, patient is becoming increasingly physically aggressive with staff where you almost have to pull her to get out of the room. Patient is disturbing the mileu with her loudness and repeating words and phrases she has not slept all day.

## 2019-03-27 NOTE — ED Notes (Signed)
Patient speaking with the hospitalist Dr. Posey Pronto

## 2019-03-27 NOTE — ED Notes (Signed)
Patient given 2.5 mg of Zyprexa due to her yelling and screaming. Patient continues to yell out Dominica Severin and green light. Patient appears to be a nit manic

## 2019-03-27 NOTE — ED Notes (Signed)
Patient walking with PT in the dayroom

## 2019-03-27 NOTE — ED Notes (Signed)
Patient able to ambulate and use the bathroom with staff urging and asking her if she has to go  This is the first time today patient has gone this shift

## 2019-03-27 NOTE — ED Notes (Signed)
BEHAVIORAL HEALTH ROUNDING Patient sleeping: No. Patient alert and oriented: yes Behavior appropriate: Yes.  ; If no, describe:  Nutrition and fluids offered: yes Toileting and hygiene offered: Yes  Sitter present: q15 minute observations and security monitoring Law enforcement present: Yes    

## 2019-03-27 NOTE — ED Notes (Signed)
Pt given Zyprexa tablet but took tablet and put in cup of water. Attempted to get pt to drink the water and pill but she would not. Water and pill thrown away. Dr Alfred Levins informed of pt behavior and will order injectable medication.

## 2019-03-27 NOTE — ED Notes (Signed)
Hourly rounding reveals patient in room. No complaints, stable, in no acute distress. Q15 minute rounds and monitoring via Security Cameras to continue. 

## 2019-03-27 NOTE — ED Notes (Signed)
Pt up to the bathroom then returned to bed. No's voiced. Will continue to monitor.

## 2019-03-27 NOTE — ED Notes (Signed)
Pt resting on the bed with eyes closed at this time.

## 2019-03-27 NOTE — ED Provider Notes (Signed)
Patient becoming increasingly agitated and aggressive towards staff members.  Will give IM calling medication.  Her EKG shows normal QT.   Merlyn Lot, MD 03/27/19 [phone]

## 2019-03-27 NOTE — ED Provider Notes (Signed)
Discussed with the psychiatric team who wanted me to reach out to the hospitalist for admission given the concern that this is more secondary to thyroid psychosis then underlying new psychosis.  I discussed the hospital team and they are going to review the chart and discussed with the psychiatric team.     Vanessa Woodmore, MD 03/27/19 [phone]

## 2019-03-27 NOTE — ED Notes (Signed)
Pt continues to yell "red light [name]" this RN attempted to redirect pt. Pt states she would like something to help her rest when asked. Will give PRN med.

## 2019-03-27 NOTE — ED Notes (Signed)
Hourly rounding reveals patient in room without yelling speaking normally. No complaints, stable, in no acute distress. Q15 minute rounds and monitoring via [employer] to continue.

## 2019-03-28 DIAGNOSIS — R462 Strange and inexplicable behavior: Secondary | ICD-10-CM | POA: Diagnosis not present

## 2019-03-28 MED ORDER — LORAZEPAM 0.5 MG PO TABS
0.5000 mg | ORAL_TABLET | Freq: Four times a day (QID) | ORAL | Status: DC | PRN
  Administered 2019-03-28 – 2019-04-05 (×5): 0.5 mg via ORAL
  Filled 2019-03-28 (×6): qty 1

## 2019-03-28 NOTE — ED Notes (Signed)
Hourly rounding reveals patient in room. No complaints, stable, in no acute distress. Q15 minute rounds and monitoring via Security Cameras to continue. 

## 2019-03-28 NOTE — ED Notes (Signed)
Pt speaking on the phone to her pastor. Maintained on 15 minute checks and observation by security camera for safety.

## 2019-03-28 NOTE — ED Notes (Signed)
Hourly rounding reveals patient sleeping in room. No complaints, stable, in no acute distress. Q15 minute rounds and monitoring via Security Cameras to continue. 

## 2019-03-28 NOTE — ED Notes (Signed)
RN attempted to call the paitent's daughter Sharyn Lull).  No answer. Unable to leave message.

## 2019-03-28 NOTE — ED Notes (Signed)
Pt continues to yell sporadically with bizarre behavior.  Maintained on 15 minute checks and observation by security camera for safety.

## 2019-03-28 NOTE — ED Notes (Signed)
Pt watching TV in her room. Maintained on 15 minute checks and observation by security camera for safety.

## 2019-03-28 NOTE — ED Notes (Signed)
Pt is confused, moving the contents of tray onto her bed and then back to the tray. Pt  speaking about "stickers." Pt then acted like she was putting on gloves. Pt asked RN how many gloves she was wearing and what color they were for "verfiication."     Pt changed into new scrub pants after spilling her OJ.   Maintained on 15 minute checks and observation by security camera for safety.

## 2019-03-28 NOTE — ED Notes (Signed)
Meal tray provided.

## 2019-03-28 NOTE — ED Notes (Signed)
PT  VOL °

## 2019-03-28 NOTE — ED Provider Notes (Signed)
-----------------------------------------   12:09 AM on 03/28/2019 -----------------------------------------   Blood pressure 122/76, pulse 67, temperature 97.7 F (36.5 C), temperature source Oral, resp. rate 16, height 5\' 6"  (1.676 m), weight 54.4 kg, SpO2 99 %.  The patient is calm and cooperative at this time.  There have been no acute events since the last update.  Awaiting disposition plan from Behavioral Medicine and/or Social Work team(s).    Nena Polio, MD 03/28/19 [phone]

## 2019-03-28 NOTE — ED Notes (Signed)
[facility] called to receive an update on patient's status and speak with the patient.

## 2019-03-28 NOTE — Social Work (Signed)
CSW aware of consult. Patient not appropriate for SNF, ALF, and memory care at this time.  CSW spoke with ED psychiatrist on this case, and patient's prospects of improving and possibly being able to go home down the road.  CSW will continue to follow the case, and consult with the psychiatrist.     Kenney Houseman  [facility] ED  [phone]

## 2019-03-28 NOTE — ED Notes (Signed)
Pt's case manager Estanislado Pandy) called to check on patient.

## 2019-03-28 NOTE — ED Notes (Signed)
PRN medications given as ordered by psychiatrist.

## 2019-03-28 NOTE — ED Notes (Signed)
Pt yelling, attempting to enter other patient's rooms and getting close to another patient in the dayroom.   PRN medication administered. Animal nutritionist and RN present in the dayroom to monitor patients.  Psychiatrist also on unit to speak with patient. Maintained on 15 minute checks and observation by security camera for safety.

## 2019-03-28 NOTE — ED Notes (Signed)
Report to include Situation, Background, Assessment, and Recommendations received from Amy B. RN. Patient alert and oriented, warm and dry, in no acute distress. Patient denies SI, HI, AVH and pain. Patient made aware of Q15 minute rounds and security cameras for their safety. Patient instructed to come to me with needs or concerns. 

## 2019-03-29 DIAGNOSIS — R462 Strange and inexplicable behavior: Secondary | ICD-10-CM | POA: Diagnosis not present

## 2019-03-29 LAB — CBC WITH DIFFERENTIAL/PLATELET
Abs Immature Granulocytes: 0.03 10*3/uL (ref 0.00–0.07)
Basophils Absolute: 0 10*3/uL (ref 0.0–0.1)
Basophils Relative: 1 %
Eosinophils Absolute: 0.2 10*3/uL (ref 0.0–0.5)
Eosinophils Relative: 3 %
HCT: 38.4 % (ref 36.0–46.0)
Hemoglobin: 12.9 g/dL (ref 12.0–15.0)
Immature Granulocytes: 0 %
Lymphocytes Relative: 37 %
Lymphs Abs: 2.6 10*3/uL (ref 0.7–4.0)
MCH: 31.9 pg (ref 26.0–34.0)
MCHC: 33.6 g/dL (ref 30.0–36.0)
MCV: 95 fL (ref 80.0–100.0)
Monocytes Absolute: 0.7 10*3/uL (ref 0.1–1.0)
Monocytes Relative: 10 %
Neutro Abs: 3.4 10*3/uL (ref 1.7–7.7)
Neutrophils Relative %: 49 %
Platelets: 284 10*3/uL (ref 150–400)
RBC: 4.04 MIL/uL (ref 3.87–5.11)
RDW: 13.5 % (ref 11.5–15.5)
WBC: 6.9 10*3/uL (ref 4.0–10.5)
nRBC: 0 % (ref 0.0–0.2)

## 2019-03-29 LAB — COMPREHENSIVE METABOLIC PANEL
ALT: 12 U/L (ref 0–44)
AST: 22 U/L (ref 15–41)
Albumin: 3.8 g/dL (ref 3.5–5.0)
Alkaline Phosphatase: 45 U/L (ref 38–126)
Anion gap: 7 (ref 5–15)
BUN: 19 mg/dL (ref 8–23)
CO2: 27 mmol/L (ref 22–32)
Calcium: 9.5 mg/dL (ref 8.9–10.3)
Chloride: 104 mmol/L (ref 98–111)
Creatinine, Ser: 0.65 mg/dL (ref 0.44–1.00)
GFR calc Af Amer: >60 mL/min (ref >60)
GFR calc non Af Amer: >60 mL/min (ref >60)
Glucose, Bld: 127 mg/dL — ABNORMAL HIGH (ref 70–99)
Potassium: 4.4 mmol/L (ref 3.5–5.1)
Sodium: 138 mmol/L (ref 135–145)
Total Bilirubin: 0.5 mg/dL (ref 0.3–1.2)
Total Protein: 6.7 g/dL (ref 6.5–8.1)

## 2019-03-29 MED ORDER — LORAZEPAM 1 MG PO TABS
1.0000 mg | ORAL_TABLET | Freq: Once | ORAL | Status: AC
  Administered 2019-03-29: 1 mg via ORAL
  Filled 2019-03-29: qty 1

## 2019-03-29 MED ORDER — HALOPERIDOL 5 MG PO TABS
5.0000 mg | ORAL_TABLET | Freq: Once | ORAL | Status: AC
  Administered 2019-03-29: 5 mg via ORAL
  Filled 2019-03-29: qty 1

## 2019-03-29 MED ORDER — LORAZEPAM 2 MG/ML IJ SOLN: 1.0000 mg | Freq: Once | INTRAMUSCULAR | Status: AC

## 2019-03-29 MED ORDER — HALOPERIDOL LACTATE 5 MG/ML IJ SOLN: 5.0000 mg | Freq: Once | INTRAMUSCULAR | Status: AC

## 2019-03-29 NOTE — ED Notes (Signed)
Patient ate 100% of supper and beverage.  

## 2019-03-29 NOTE — ED Notes (Signed)
Report to include situation, background, assessment and recommendations from Wendy RN. Patient sleeping, respirations regular and unlabored. Q15 minute rounds and security camera observation to continue.    

## 2019-03-29 NOTE — Progress Notes (Signed)
A 73 year old woman presented on Friday with psychotic symptoms.  At that point, the writer believed that patient was suffering from the sequelae of hypothyroidism. The patient's nurse reported that she is awake and presenting with manic symptoms and restlessness.  Will give her IM or PO 1 mg of Ativan and 5 mg Haldol medications to assist with calming her down.  Her EKG shows normal QT.

## 2019-03-29 NOTE — ED Notes (Signed)
Hourly rounding reveals patient in room yelling out intermittently despite redirection. No complaints, stable, in no acute distress. Q15 minute rounds and monitoring via [employer] to continue.

## 2019-03-29 NOTE — ED Notes (Signed)
Hourly rounding reveals patient sleeping in room. No complaints, stable, in no acute distress. Q15 minute rounds and monitoring via Security Cameras to continue. 

## 2019-03-29 NOTE — ED Notes (Signed)
Hourly rounding reveals patient in room repeating announcers on TV. No complaints, stable, in no acute distress. Q15 minute rounds and monitoring via [employer] to continue.

## 2019-03-29 NOTE — Evaluation (Signed)
Occupational Therapy Evaluation Patient Details Name: [name] MRN: [id number] DOB: 1946/03/29 Today's Date: 03/29/2019    History of Present Illness presented to ER for AMS, management of subacute psychosis.  Recently hospitalized (and familiar to Clinical research associate) due to metabolic encephalopathy.   Clinical Impression   Pt seen for OT evaluation this date. Prior to hospital admission, pt was I with fxl mobility and most ADLs (intermittent assist from PCAs for bathing), with aide assist for most IADLs.  Pt's living arrangements are unclear, but potentially lives in apartment with some steps to enter.  Currently pt demonstrates impairments in fxl reasoning, sequencing, and safety awareness requiring supv assist for all aspects of self care and ADL mobility.  Pt would benefit from skilled OT to address noted impairments and functional limitations (see below for any additional details) in order to maximize safety and independence while minimizing falls risk and caregiver burden.  Upon hospital discharge, recommend pt discharge to locked memory care unit.    Follow Up Recommendations  Supervision/Assistance - 24 hour;Other (comment)(memory care)    Equipment Recommendations  None recommended by OT    Recommendations for Other Services       Precautions / Restrictions Precautions Precautions: Fall Restrictions Weight Bearing Restrictions: No      Mobility Bed Mobility               General bed mobility comments: pt up walking around when OT presents  Transfers Overall transfer level: Modified independent Equipment used: None                  Balance Overall balance assessment: Modified Independent                                         ADL either performed or assessed with clinical judgement   ADL Overall ADL's : At baseline                                       General ADL Comments: pt requires supervision for safety and setup  for almost every aspect of self care d/t poor safety awareness sequencing secondary to cognitive status.     Vision Baseline Vision/History: Wears glasses Wears Glasses: At all times Patient Visual Report: No change from baseline       Perception     Praxis      Pertinent Vitals/Pain Pain Assessment: No/denies pain     Hand Dominance Right   Extremity/Trunk Assessment Upper Extremity Assessment Upper Extremity Assessment: Overall WFL for tasks assessed   Lower Extremity Assessment Lower Extremity Assessment: Defer to PT evaluation;Overall [facility] for tasks assessed       Communication Communication Communication: Other (comment)(pt demos echolalia at times.)   Cognition Arousal/Alertness: Awake/alert Behavior During Therapy: Impulsive Overall Cognitive Status: No family/caregiver present to determine baseline cognitive functioning                                 General Comments: pt only oriented to self, demos only ~25-30% command following. Pt attempts to follow commands when she appears more lucid-making eye contact, but with easy distractability. Intermittently focuses on other patient on unit. She is pleasant throughout.   General Comments       Exercises  Other Exercises Other Exercises: Pt requires constant safety awareness cues. OT Attempts to implement safe d/c planning with pt, but pt with easy distractability with very limited ability to resonably plan for safe discharge.   Shoulder Instructions      Home Living Family/patient expects to be discharged to:: Private residence   Available Help at Discharge: Personal care attendant;Available PRN/intermittently Type of Home: Apartment Home Access: Stairs to enter Entrance Stairs-Number of Steps: unsure   Home Layout: One level     Bathroom Shower/Tub: Teacher, early years/pre: Standard     Home Equipment: Cane - single point;Grab bars - toilet;Grab bars - tub/shower;Hand held  shower head;Bedside commode          Prior Functioning/Environment Level of Independence: Independent;Needs assistance  Gait / Transfers Assistance Needed: Pt I with fxl mobility, has SPC at home that she reports not using ADL's / Homemaking Assistance Needed: Pt self-reports being Indep with most aspects of self care. Has home health aide that helps with higher level IADLs like grocery shopping, cooking, cleaning, med mgt, and sometimes bathing if needed. Pt unclear on transportation situation.   Comments: Pt appears lucid at some points while recalling PLOF and home setting information, sometimes unclear and pt is questionable historian. In addition, some PLOF information gleaned from previous evaluation.        OT Problem List: Decreased safety awareness      OT Treatment/Interventions: Self-care/ADL training;Therapeutic exercise;Therapeutic activities;DME and/or AE instruction;Patient/family education;Balance training;Cognitive remediation/compensation    OT Goals(Current goals can be found in the care plan section) Acute Rehab OT Goals Patient Stated Goal: to do better OT Goal Formulation: With patient Time For Goal Achievement: 04/12/19 Potential to Achieve Goals: Good  OT Frequency: Min 1X/week   Barriers to D/C: Decreased caregiver support          Co-evaluation              AM-PAC OT "6 Clicks" Daily Activity     Outcome Measure Help from another person eating meals?: None Help from another person taking care of personal grooming?: None Help from another person toileting, which includes using toliet, bedpan, or urinal?: A Little Help from another person bathing (including washing, rinsing, drying)?: A Little Help from another person to put on and taking off regular upper body clothing?: None Help from another person to put on and taking off regular lower body clothing?: A Little 6 Click Score: 21   End of Session    Activity Tolerance: Patient tolerated  treatment well Patient left: in chair;Other (comment)(in observational area with cameras trained on pt and nurse and security able to observe through viewing glass.)                   Time: 1062-6948 OT Time Calculation (min): 25 min Charges:  OT General Charges $OT Visit: 1 Visit OT Evaluation $OT Eval Low Complexity: 1 Low OT Treatments $Self Care/Home Management : 8-22 mins  Gerrianne Scale, MS, OTR/L ascom [phone] or [phone] 03/29/19, 5:28 PM

## 2019-03-29 NOTE — TOC Progression Note (Addendum)
Transition of Care [facility]) - Progression Note    Patient Details  Name: [name] MRN: [id number] Date of Birth: April 17, 1946  [facility] CM/SW Contact  Marshell Garfinkel, RN Phone Number: 03/29/2019, 8:11 AM  Clinical Narrative:     RNCM spoke with Ulis Rias with [facility] [phone] requesting assistance with 24/7 care in outpatient setting. He was not aware that she was in the hospital. Updated that she has been back in ED since 03/25/19.  He may request to come evaluate patient; this RNs contact provided to [place].  **please add [facility] to home health plan for medication management. Update 1432: RNCM received call from Freddy Jaksch with [employer] [phone] requesting update.       Expected Discharge Plan and Services                                                 Social Determinants of Health (SDOH) Interventions    Readmission Risk Interventions No flowsheet data found.

## 2019-03-29 NOTE — ED Notes (Signed)
Hourly rounding reveals patient in room. No complaints, stable, in no acute distress. Q15 minute rounds and monitoring via Security Cameras to continue. 

## 2019-03-29 NOTE — ED Notes (Signed)
Patient alert and oriented, warm and dry, in no acute distress. Patient denies SI, HI, AVH and pain. Patient made aware of Q15 minute rounds and security cameras for their safety. Patient instructed to come to me with needs or concerns. 

## 2019-03-29 NOTE — ED Notes (Signed)
Patient received breakfast tray and beverage, she is calm and cooperative, will continue to monitor.

## 2019-03-29 NOTE — ED Provider Notes (Signed)
Discussed case with psychiatry, Dr. Donald Pore. Interestingly, pt's presentation seems consistent with protracted delirium 2/2 recent hypothyroidism, ?UTI. However, her TSH/free T4 are now back to normal, vitals are stable, and labs are reassuring. No apparent acute medical etiology for admission based on this, but will likely need management for her prolonged delirium.   Duffy Bruce, MD 03/29/19 [phone]

## 2019-03-29 NOTE — ED Notes (Signed)
Patient in dayroom, yelling and laughing, she is not agitated, just talking loudly, Nurse did give her prn medication for anxiety, Patient is redirectable, and will take medications as ordered, will continue to monitor.

## 2019-03-29 NOTE — ED Notes (Signed)
Hourly rounding reveals patient in room talking intermittantly. No complaints, stable, in no acute distress. Q15 minute rounds and monitoring via [employer] to continue.

## 2019-03-29 NOTE — ED Provider Notes (Signed)
-----------------------------------------   5:45 AM on 03/29/2019 -----------------------------------------   Blood pressure 121/73, pulse 73, temperature 98.2 F (36.8 C), temperature source Oral, resp. rate 16, height 5\' 6"  (1.676 m), weight 54.4 kg, SpO2 98 %.  The patient had no acute events since last update.  Calm and cooperative at this time.  Disposition is pending per Psychiatry/Behavioral Medicine team recommendations.     Rudene Re, MD 03/29/19 [phone]

## 2019-03-29 NOTE — ED Notes (Addendum)
Patient took po medications without difficulty, patient remains calm, just ask to leave light on in room, she is watching tv,  No signs of distress.

## 2019-03-29 NOTE — ED Notes (Signed)
Patient ate 100% of lunch and beverabe.

## 2019-03-30 DIAGNOSIS — R462 Strange and inexplicable behavior: Secondary | ICD-10-CM | POA: Diagnosis not present

## 2019-03-30 LAB — URINALYSIS, ROUTINE W REFLEX MICROSCOPIC
Bacteria, UA: NONE SEEN
Bilirubin Urine: NEGATIVE
Glucose, UA: NEGATIVE mg/dL
Ketones, ur: NEGATIVE mg/dL
Nitrite: NEGATIVE
Protein, ur: NEGATIVE mg/dL
Specific Gravity, Urine: 1.019 (ref 1.005–1.030)
pH: 5 (ref 5.0–8.0)

## 2019-03-30 LAB — THYROID PANEL WITH TSH
Free Thyroxine Index: 1.4 (ref 1.2–4.9)
T3 Uptake Ratio: 26 % (ref 24–39)
T4, Total: 5.4 ug/dL (ref 4.5–12.0)
TSH: 18.5 u[IU]/mL — ABNORMAL HIGH (ref 0.450–4.500)

## 2019-03-30 LAB — T4, FREE: Free T4: 0.7 ng/dL (ref 0.61–1.12)

## 2019-03-30 MED ORDER — LORAZEPAM 2 MG/ML IJ SOLN
1.0000 mg | Freq: Once | INTRAMUSCULAR | Status: AC
  Administered 2019-03-30: 1 mg via INTRAMUSCULAR
  Filled 2019-03-30: qty 1

## 2019-03-30 MED ORDER — HALOPERIDOL LACTATE 5 MG/ML IJ SOLN
5.0000 mg | Freq: Once | INTRAMUSCULAR | Status: AC
  Administered 2019-03-30: 5 mg via INTRAMUSCULAR
  Filled 2019-03-30: qty 1

## 2019-03-30 MED ORDER — LORAZEPAM 1 MG PO TABS: 1.0000 mg | ORAL_TABLET | Freq: Once | ORAL | Status: AC

## 2019-03-30 MED ORDER — HALOPERIDOL 5 MG PO TABS: 5.0000 mg | ORAL_TABLET | Freq: Once | ORAL | Status: AC

## 2019-03-30 MED ORDER — ACETAMINOPHEN 500 MG PO TABS
ORAL_TABLET | ORAL | Status: AC
  Filled 2019-03-30: qty 2

## 2019-03-30 MED ORDER — ACETAMINOPHEN 500 MG PO TABS
1000.0000 mg | ORAL_TABLET | Freq: Once | ORAL | Status: AC
  Administered 2019-03-30: 1000 mg via ORAL

## 2019-03-30 NOTE — ED Provider Notes (Signed)
-----------------------------------------   6:31 AM on 03/30/2019 -----------------------------------------   Blood pressure (!) 152/79, pulse 77, temperature 98.1 F (36.7 C), temperature source Oral, resp. rate 16, height 5\' 6"  (1.676 m), weight 54.4 kg, SpO2 98 %.  The patient is sleeping at this time.  There have been no acute events since the last update.  Awaiting disposition plan from Behavioral Medicine and/or Social Work team(s).   Paulette Blanch, MD 03/30/19 [phone]

## 2019-03-30 NOTE — ED Notes (Signed)
Hourly rounding reveals patient in room. No complaints, stable, in no acute distress. Q15 minute rounds and monitoring via Security Cameras to continue. 

## 2019-03-30 NOTE — ED Notes (Signed)
Patient ambulating in and out her room coming up to window requesting a new mask and a remote control. Patient calm and easily redirected to room. Safety maintained.

## 2019-03-30 NOTE — Progress Notes (Signed)
A 73 year old woman presented on Friday with psychotic symptoms.  At that point, the writer believed that patient was suffering from the sequelae of hypothyroidism. The patient can be heard yelling in the milieu, she is on redirectable and refused to stay in her room. Will give her IM or PO 1 mg of Ativan and 5 mg Haldol medications to assist with calming her down.

## 2019-03-30 NOTE — ED Notes (Signed)
VOL/Pending Placement 

## 2019-03-30 NOTE — ED Notes (Signed)
Patient up keeps trying to get in bathroom after already going, she did manage to get locked door open, she threw paper towels in floor, able to redirect back to her room, she takes medication without difficulty, will continue to monitor.

## 2019-03-30 NOTE — ED Notes (Signed)
Patient started creaming random names.She is disturbing the unit and waking other patients. Staff unable to redirect.Psych Np notified.

## 2019-03-30 NOTE — ED Notes (Signed)
Lunch provided.

## 2019-03-31 DIAGNOSIS — R462 Strange and inexplicable behavior: Secondary | ICD-10-CM | POA: Diagnosis not present

## 2019-03-31 MED ORDER — ACETAMINOPHEN 325 MG PO TABS
650.0000 mg | ORAL_TABLET | Freq: Once | ORAL | Status: AC
  Administered 2019-03-31: 650 mg via ORAL
  Filled 2019-03-31: qty 2

## 2019-03-31 NOTE — ED Notes (Signed)
BEHAVIORAL HEALTH ROUNDING Patient sleeping: Yes.   Patient alert and oriented: eyes closed  Appears asleep Behavior appropriate: Yes.  ; If no, describe:  Nutrition and fluids offered: Yes  Toileting and hygiene offered: sleeping Sitter present: q 15 minute observations and security camera monitoring  

## 2019-03-31 NOTE — ED Notes (Signed)
BEHAVIORAL HEALTH ROUNDING Patient sleeping: No. Patient alert and oriented: yes Behavior appropriate: Yes.  ; If no, describe:  Nutrition and fluids offered: yes Toileting and hygiene offered: Yes  Sitter present: q15 minute observations and security camera monitoring   

## 2019-03-31 NOTE — ED Notes (Signed)
ED BHU [place] Is the patient under IVC or is there intent for IVC:  voluntary Is the patient medically cleared: Yes.   Is there vacancy in the ED BHU: Yes.   Is the population mix appropriate for patient: Yes.   Is the patient awaiting placement in inpatient or outpatient setting:   Has the patient had a psychiatric consult: Yes.  Reevaluation pending Survey of unit performed for contraband, proper placement and condition of furniture, tampering with fixtures in bathroom, shower, and each patient room: Yes.  ; Findings:  APPEARANCE/BEHAVIOR Calm and cooperative NEURO ASSESSMENT Orientation: oriented x3   Hallucinations: No.None noted (Hallucinations) denies  Speech: Normal Gait: normal RESPIRATORY ASSESSMENT Even  Unlabored respirations  CARDIOVASCULAR ASSESSMENT Pulses equal   regular rate  Skin warm and dry   GASTROINTESTINAL ASSESSMENT no GI complaint EXTREMITIES Full ROM  PLAN OF CARE Provide calm/safe environment. Vital signs assessed twice daily. ED BHU Assessment once each 12-hour shift. Collaborate with TTS daily or as condition indicates. Assure the ED provider has rounded once each shift. Provide and encourage hygiene. Provide redirection as needed. Assess for escalating behavior; address immediately and inform ED provider.  Assess family dynamic and appropriateness for visitation as needed: Yes.  ; If necessary, describe findings:  Educate the patient/family about BHU procedures/visitation: Yes.  ; If necessary, describe findings:

## 2019-03-31 NOTE — ED Notes (Signed)
Patient given 650 mg of Tylenol due to back pain, patient says that the room is chilly and when people become loud it makes her jittery and it goes to her spine. Patient said the pain is 5/10

## 2019-03-31 NOTE — ED Provider Notes (Signed)
-----------------------------------------   7:00 AM on 03/31/2019 -----------------------------------------   Blood pressure (!) 160/63, pulse 90, temperature 98 F (36.7 C), temperature source Oral, resp. rate 17, height 5\' 6"  (1.676 m), weight 54.4 kg, SpO2 98 %.  The patient is calm and cooperative at this time.  There have been no acute events since the last update.  Awaiting disposition plan from Behavioral Medicine and/or Social Work team(s).    Merlyn Lot, MD 03/31/19 0700

## 2019-03-31 NOTE — ED Notes (Addendum)

## 2019-03-31 NOTE — ED Notes (Signed)
Pt. Sleeping in bed in [place] [facility]5.

## 2019-03-31 NOTE — ED Notes (Signed)
Pt at door asking staff to use the phone, pt informed that phone hours are over for the day and would resume tomorrow morning. Pt provided with milk and graham crackers for night time snack, pt expresses no further needs at this time, staff will continue to monitor q 15 min checks and security surveillance

## 2019-03-31 NOTE — ED Notes (Addendum)
BEHAVIORAL HEALTH ROUNDING Patient sleeping: No. Patient alert and oriented: yes Behavior appropriate: Yes.  ; If no, describe:  Nutrition and fluids offered: yes Toileting and hygiene offered: Yes  Sitter present: q15 minute observations and security monitoring Law enforcement present: Yes    

## 2019-03-31 NOTE — ED Notes (Signed)
Pt observed with no unusual behavior  Appropriate to stimulation  No verbalized needs or concerns at this time  NAD assessed  Continue to monitor 

## 2019-03-31 NOTE — ED Notes (Signed)
Pt. Came to nursing station and requested tv remote and channel sheet.  Pt. Given both.  Pt. Calm, cooperative and appears to be in a good mood.

## 2019-04-01 DIAGNOSIS — R462 Strange and inexplicable behavior: Secondary | ICD-10-CM | POA: Diagnosis not present

## 2019-04-01 MED ORDER — TRAZODONE HCL 50 MG PO TABS
50.0000 mg | ORAL_TABLET | Freq: Every day | ORAL | Status: DC
  Administered 2019-04-01 – 2019-04-02 (×3): 50 mg via ORAL
  Filled 2019-04-01 (×6): qty 1

## 2019-04-01 NOTE — ED Notes (Signed)
Report to include Situation, Background, Assessment, and Recommendations received from Amy B. RN. Patient alert and, warm and dry, in no acute distress. Patient denies SI, HI, AVH and pain. Patient made aware of Q15 minute rounds and security cameras for their safety. Patient instructed to come to me with needs or concerns. 

## 2019-04-01 NOTE — ED Notes (Signed)
Hourly rounding reveals patient in room. No complaints, stable, in no acute distress. Q15 minute rounds and monitoring via Security Cameras to continue. 

## 2019-04-01 NOTE — ED Notes (Signed)
Hourly rounding reveals patient in rest room. No complaints, stable, in no acute distress. Q15 minute rounds and monitoring via Security Cameras to continue. 

## 2019-04-01 NOTE — ED Provider Notes (Signed)
-----------------------------------------   7:09 AM on 04/01/2019 -----------------------------------------   Blood pressure (!) 144/78, pulse 82, temperature 98 F (36.7 C), temperature source Oral, resp. rate 18, height 5\' 6"  (1.676 m), weight 54.4 kg, SpO2 98 %.  The patient is calm and cooperative at this time.  There have been no acute events since the last update.  Patient does complain of knee pain this morning, but has not had any falls or other trauma.  Awaiting disposition plan from Behavioral Medicine and/or Social Work team(s).   Carrie Mew, MD 04/01/19 [phone]

## 2019-04-01 NOTE — ED Notes (Signed)
Hourly rounding reveals patient in room. Pt. complains of nausea, stable, in no acute distress. Q15 minute rounds and monitoring via [employer] to continue.

## 2019-04-01 NOTE — ED Notes (Signed)
VOL/Pending Placement 

## 2019-04-01 NOTE — ED Notes (Signed)
Went into patients room to given synthroid medication.  Pt. Requested assistance to bathroom.  Pt. Walked slow and wanted hand assistance.  Pt. Went into bathroom by self and needed assistance off toilet.  Pt. Walked with assistance to chair in common area.  Vitals taken, charge nurse informed.  Pt. Assisted to room with 2 person assist. Pt. Made comfortable in bed.  Pt. Began yelling out things she was hearing on tv.

## 2019-04-01 NOTE — ED Provider Notes (Signed)
-----------------------------------------   8:04 AM on 04/01/2019 -----------------------------------------  Overnight nurse stated the patient was complaining of being unable to ambulate.  I personally have seen and evaluated the patient and asked her to stand up and she was able to ambulate to the door without any apparent difficulty.  We will continue to monitor the situation.   Harvest Dark, MD 04/01/19 [phone]

## 2019-04-01 NOTE — ED Notes (Signed)
Hourly rounding reveals patient in room. Patient states nausea is less, stable, in no acute distress. Q15 minute rounds and monitoring via [employer] to continue.

## 2019-04-02 DIAGNOSIS — R462 Strange and inexplicable behavior: Secondary | ICD-10-CM | POA: Diagnosis not present

## 2019-04-02 NOTE — ED Notes (Signed)
Pt provided with sandwich tray for night time snack and sprite to drink, pt expresses no further needs at this time. Will continue to monitor via q 15 min checks and security surveillance  

## 2019-04-02 NOTE — ED Notes (Signed)
Hourly rounding reveals patient in room. No complaints, stable, in no acute distress. Q15 minute rounds and monitoring via Security Cameras to continue. 

## 2019-04-02 NOTE — ED Notes (Signed)
Hourly rounding reveals patient sleeping in room. No complaints, stable, in no acute distress. Q15 minute rounds and monitoring via Security Cameras to continue. 

## 2019-04-02 NOTE — ED Notes (Signed)
Pt given meal tray.

## 2019-04-02 NOTE — ED Notes (Signed)
Hourly rounding reveals patient awake in room. No complaints, stable, in no acute distress. Q15 minute rounds and monitoring via Security Cameras to continue. 

## 2019-04-02 NOTE — ED Notes (Signed)
Hourly rounding reveals patient in rest room. No complaints, stable, in no acute distress. Q15 minute rounds and monitoring via Security Cameras to continue. 

## 2019-04-02 NOTE — ED Notes (Signed)
Pt given meal tray and drink.

## 2019-04-02 NOTE — ED Notes (Signed)
Pt complaining of dizziness. EDP and NP made aware.

## 2019-04-02 NOTE — ED Notes (Signed)
Spoke with Ms. Owens Shark about [name] calling to check on her and she was very happy to hear that. She is excited to know that somebody called about her. She is ready for her snack and will set her up.

## 2019-04-02 NOTE — ED Notes (Signed)
Patient's case manager, [name] called to check on patient. She can be reached at [phone]. 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 She is concerned that [name] might have some thyroid problems which may be causing her to yell out about clocks and numbers. She asked that I tell [name] she called and that she will be calling tomorrow to chat with her. Will update [name] accordingly.

## 2019-04-02 NOTE — ED Notes (Signed)
Pt stating she was dizzy and nauseous. VS stable. PRN medication for nausea administered.

## 2019-04-03 DIAGNOSIS — R462 Strange and inexplicable behavior: Secondary | ICD-10-CM | POA: Diagnosis not present

## 2019-04-03 MED ORDER — ONDANSETRON 4 MG PO TBDP
4.0000 mg | ORAL_TABLET | Freq: Once | ORAL | Status: AC
  Administered 2019-04-03: 4 mg via ORAL
  Filled 2019-04-03: qty 1

## 2019-04-03 NOTE — ED Notes (Signed)
Assumed care of patient this morning. Patient up and rooming in the day room. siting quietly watching TV. Denies any pain issues or concerns. requesting to shower this morning. Supplies given and bath supply provided.

## 2019-04-03 NOTE — ED Notes (Signed)
In day room watching music channel and socializing with other patients,  

## 2019-04-03 NOTE — ED Notes (Signed)
Hourly rounding reveals patient sleeping in room. No complaints, stable, in no acute distress. Q15 minute rounds and monitoring via Security Cameras to continue. 

## 2019-04-03 NOTE — ED Notes (Signed)
Hourly rounding reveals patient in room. No complaints, stable, in no acute distress. Q15 minute rounds and monitoring via Security Cameras to continue. 

## 2019-04-03 NOTE — ED Provider Notes (Signed)
-----------------------------------------   6:55 AM on 04/03/2019 -----------------------------------------   Blood pressure 121/66, pulse 83, temperature 98.1 F (36.7 C), temperature source Oral, resp. rate 18, height 5\' 6"  (1.676 m), weight 54.4 kg, SpO2 94 %.  The patient is sleeping at this time.  There have been no acute events since the last update.  Awaiting disposition plan from Behavioral Medicine and/or Social Work team(s).   Paulette Blanch, MD 04/03/19 [phone]

## 2019-04-03 NOTE — ED Notes (Signed)
VOL  PENDING  PLACEMENT 

## 2019-04-03 NOTE — ED Notes (Signed)
Patient up and down into bed and back to bathroom. Asked patient if she wanted to reconsider her melatonin but she continues to decline stating that she doesn't think it works very well. Will continue to monitor.

## 2019-04-03 NOTE — ED Notes (Signed)
Hourly rounding reveals patient in room. Patient c/o nausea, ginger ale given. Stable, in no acute distress. Q15 minute rounds and monitoring via [employer] to continue.

## 2019-04-03 NOTE — ED Notes (Signed)
Patient shower this mornning and changed into clean clothes.

## 2019-04-03 NOTE — ED Notes (Signed)
Patient up to the bathroom with a steady gait. Asked that I stand guard at the bathroom door so no one would look in on her.

## 2019-04-03 NOTE — ED Notes (Signed)
Dinner tray provided

## 2019-04-03 NOTE — ED Notes (Signed)
Report to include Situation, Background, Assessment, and Recommendations received from Jeannette RN. Patient alert and oriented, warm and dry, in no acute distress. Patient denies SI, HI, AVH and pain. Patient made aware of Q15 minute rounds and security cameras for their safety. Patient instructed to come to me with needs or concerns.  

## 2019-04-03 NOTE — ED Notes (Signed)
Patient up to bathroom. Wanted tech to stand by the door so nobody got her. Walked back to her room and back in bed. Will continue to monitor

## 2019-04-04 ENCOUNTER — Emergency Department: Payer: Medicare HMO

## 2019-04-04 DIAGNOSIS — R1110 Vomiting, unspecified: Secondary | ICD-10-CM | POA: Diagnosis not present

## 2019-04-04 DIAGNOSIS — R462 Strange and inexplicable behavior: Secondary | ICD-10-CM | POA: Diagnosis not present

## 2019-04-04 LAB — CBC WITH DIFFERENTIAL/PLATELET
Abs Immature Granulocytes: 0.03 10*3/uL (ref 0.00–0.07)
Basophils Absolute: 0 10*3/uL (ref 0.0–0.1)
Basophils Relative: 0 %
Eosinophils Absolute: 0 10*3/uL (ref 0.0–0.5)
Eosinophils Relative: 0 %
HCT: 37.4 % (ref 36.0–46.0)
Hemoglobin: 12.1 g/dL (ref 12.0–15.0)
Immature Granulocytes: 1 %
Lymphocytes Relative: 10 %
Lymphs Abs: 0.6 10*3/uL — ABNORMAL LOW (ref 0.7–4.0)
MCH: 31.6 pg (ref 26.0–34.0)
MCHC: 32.4 g/dL (ref 30.0–36.0)
MCV: 97.7 fL (ref 80.0–100.0)
Monocytes Absolute: 0.3 10*3/uL (ref 0.1–1.0)
Monocytes Relative: 5 %
Neutro Abs: 4.7 10*3/uL (ref 1.7–7.7)
Neutrophils Relative %: 84 %
Platelets: 177 10*3/uL (ref 150–400)
RBC: 3.83 MIL/uL — ABNORMAL LOW (ref 3.87–5.11)
RDW: 13.2 % (ref 11.5–15.5)
WBC: 5.6 10*3/uL (ref 4.0–10.5)
nRBC: 0 % (ref 0.0–0.2)

## 2019-04-04 LAB — BASIC METABOLIC PANEL
Anion gap: 10 (ref 5–15)
BUN: 13 mg/dL (ref 8–23)
CO2: 27 mmol/L (ref 22–32)
Calcium: 9.1 mg/dL (ref 8.9–10.3)
Chloride: 99 mmol/L (ref 98–111)
Creatinine, Ser: 0.57 mg/dL (ref 0.44–1.00)
GFR calc Af Amer: >60 mL/min (ref >60)
GFR calc non Af Amer: >60 mL/min (ref >60)
Glucose, Bld: 155 mg/dL — ABNORMAL HIGH (ref 70–99)
Potassium: 3.7 mmol/L (ref 3.5–5.1)
Sodium: 136 mmol/L (ref 135–145)

## 2019-04-04 LAB — MAGNESIUM: Magnesium: 2.1 mg/dL (ref 1.7–2.4)

## 2019-04-04 LAB — GLUCOSE, CAPILLARY: Glucose-Capillary: 185 mg/dL — ABNORMAL HIGH (ref 70–99)

## 2019-04-04 MED ORDER — METOCLOPRAMIDE HCL 5 MG PO TABS: 5.0000 mg | ORAL_TABLET | Freq: Three times a day (TID) | ORAL | Status: DC | PRN

## 2019-04-04 MED ORDER — METOCLOPRAMIDE HCL 5 MG PO TABS
5.0000 mg | ORAL_TABLET | Freq: Once | ORAL | Status: AC
  Administered 2019-04-04: 5 mg via ORAL
  Filled 2019-04-04: qty 1

## 2019-04-04 MED ORDER — METOCLOPRAMIDE HCL 5 MG PO TABS
5.0000 mg | ORAL_TABLET | Freq: Two times a day (BID) | ORAL | Status: DC | PRN
  Administered 2019-04-05: 5 mg via ORAL
  Filled 2019-04-04 (×3): qty 1

## 2019-04-04 NOTE — ED Notes (Signed)
Hourly rounding reveals patient in room. Stable, in no acute distress. Q15 minute rounds and monitoring via Security Cameras to continue. 

## 2019-04-04 NOTE — ED Notes (Signed)
ED BHU [place] Is the patient under IVC or is there intent for IVC: voluntary Is the patient medically cleared: Yes.   Is there vacancy in the ED BHU: Yes.   Is the population mix appropriate for patient: Yes.   Is the patient awaiting placement in inpatient or outpatient setting: Yes.  Social work is finding her a safe place to live   Has the patient had a psychiatric consult: Yes.   Survey of unit performed for contraband, proper placement and condition of furniture, tampering with fixtures in bathroom, shower, and each patient room: Yes.  ; Findings:  APPEARANCE/BEHAVIOR Calm and cooperative NEURO ASSESSMENT Orientation: oriented x3  Denies pain Hallucinations: No.None noted (Hallucinations)  denies Speech: Normal Gait: normal RESPIRATORY ASSESSMENT Even  Unlabored respirations  CARDIOVASCULAR ASSESSMENT Pulses equal   regular rate  Skin warm and dry   GASTROINTESTINAL ASSESSMENT no GI complaint EXTREMITIES Full ROM  PLAN OF CARE Provide calm/safe environment. Vital signs assessed twice daily. ED BHU Assessment once each 12-hour shift. Collaborate with TTS daily or as condition indicates. Assure the ED provider has rounded once each shift. Provide and encourage hygiene. Provide redirection as needed. Assess for escalating behavior; address immediately and inform ED provider.  Assess family dynamic and appropriateness for visitation as needed: Yes.  ; If necessary, describe findings:  Educate the patient/family about BHU procedures/visitation: Yes.  ; If necessary, describe findings:

## 2019-04-04 NOTE — ED Notes (Signed)
Pt observed lying in bed - watching TV   Pt visualized with NAD She verbalizes "I don't feel well - I am so nauseated"  Basin provided for her if she feels like she is going to vomit barium swallow study to be performed this am

## 2019-04-04 NOTE — ED Notes (Signed)
BEHAVIORAL HEALTH ROUNDING Patient sleeping: No. She is lying in bed  Patient alert and oriented: yes Behavior appropriate: Yes.  ; If no, describe:  Nutrition and fluids offered: yes Toileting and hygiene offered: Yes  Sitter present: q15 minute observations and security camera monitoring

## 2019-04-04 NOTE — ED Notes (Signed)
Pt. Vomited two times in room of undigested food. Dr. Joan Mayans consulted.

## 2019-04-04 NOTE — ED Notes (Signed)

## 2019-04-04 NOTE — ED Provider Notes (Signed)
-----------------------------------------   5:51 AM on 04/04/2019 -----------------------------------------   BP (!) 161/73 (BP Location: Right Arm)   Pulse 94   Temp 98.6 F (37 C) (Oral)   Resp 18   Ht 5\' 6"  (1.676 m)   Wt 54.4 kg   SpO2 96%   BMI 19.37 kg/m   The patient is calm and cooperative at this time. Patient had two episodes of emesis this evening, appeared to look like recently eaten food. On chart review, she does have hx of esophageal stricture that was dilated by GI. Ordered barium swallow for AM. Awaiting disposition plan from Behavioral Medicine and/or Social Work team(s).   Lilia Pro., MD 04/04/19 [phone]

## 2019-04-04 NOTE — ED Notes (Signed)
BEHAVIORAL HEALTH ROUNDING Patient sleeping: No. Patient alert and oriented: yes Behavior appropriate: Yes.  ; If no, describe:  Nutrition and fluids offered: yes Toileting and hygiene offered: Yes  Sitter present: q15 minute observations and security camera monitoring   

## 2019-04-04 NOTE — ED Notes (Signed)
Report to include Situation, Background, Assessment, and Recommendations received from Amy Teague RN. Patient alert and oriented, warm and dry, in no acute distress. Patient denies SI, HI, AVH and pain. Patient made aware of Q15 minute rounds and security cameras for their safety. Patient instructed to come to me with needs or concerns. 

## 2019-04-04 NOTE — ED Notes (Signed)
She has ambulated to the BR  She reports emesis   I saw yelow thin liquid emesis in basin  - small amount

## 2019-04-04 NOTE — ED Notes (Signed)
BEHAVIORAL HEALTH ROUNDING Patient sleeping: Yes.   Patient alert and oriented: eyes closed  Appears asleep Behavior appropriate: Yes.  ; If no, describe:  Nutrition and fluids offered: Yes  Toileting and hygiene offered: sleeping Sitter present: q 15 minute observations and security camera monitoring  

## 2019-04-04 NOTE — ED Notes (Signed)
Ambulatory to the BR with a steady gait  - no distress verbalized

## 2019-04-04 NOTE — ED Notes (Signed)
Pt transferred to radiology for study to be performed  She is voluntary  Escorted by me

## 2019-04-04 NOTE — ED Notes (Signed)
Hourly rounding reveals patient in room. No complaints, stable, in no acute distress. Q15 minute rounds and monitoring via Security Cameras to continue. 

## 2019-04-04 NOTE — ED Notes (Signed)
She has returned from radiology

## 2019-04-04 NOTE — ED Notes (Signed)
Hourly rounding reveals patient in room. Stable, in no acute distress. Q15 minute rounds and monitoring via [employer] to continue. Patient still refusing meds to help her sleep.

## 2019-04-04 NOTE — ED Provider Notes (Addendum)
7:15 AM Assumed care for off going team.   Blood pressure (!) 161/73, pulse 94, temperature 98.6 F (37 C), temperature source Oral, resp. rate 18, height 5\' 6"  (1.676 m), weight 54.4 kg, SpO2 96 %.  See their HPI for full report but in brief prior esophageal stricture now with increasing vomiting.  Now on clear liquid.  Given reglan.   Initially here for agitation but awaiting placement.   Barium swallow was normal.   Reevaluated patient.  Her abdomen is soft and nontender.  She is still passing gas.  She had a prior hysterectomy but no prior history of bowel obstruction.  No evidence of that upon examination.  She does endorse a little bit of continued nausea.  She says it felt better with the Reglan earlier.  Given her history of diabetes she could have some gastroparesis.  We will add on a as needed for Reglan.  At this time we will continue to closely monitor patient.  I did put in for some repeat labs.      Vanessa [place], MD 04/04/19 1233    Vanessa [place], MD 04/04/19 1329

## 2019-04-04 NOTE — ED Notes (Signed)
Pt. Vomited small amount of undigested food on bed in room.

## 2019-04-04 NOTE — ED Notes (Signed)
BEHAVIORAL HEALTH ROUNDING Patient sleeping: No - she is lying in bed  - eyes closed   Patient alert and oriented: yes Behavior appropriate: Yes.  ; If no, describe:  Nutrition and fluids offered: yes Toileting and hygiene offered: Yes  Sitter present: q15 minute observations and security camera monitoring

## 2019-04-04 NOTE — ED Notes (Signed)
Hourly rounding reveals patient sleeping in room. No complaints, stable, in no acute distress. Q15 minute rounds and monitoring via Security Cameras to continue. 

## 2019-04-04 NOTE — ED Notes (Signed)
Pt. Up to bathroom for loose stool. Scrubs and underwear changed.

## 2019-04-05 DIAGNOSIS — R462 Strange and inexplicable behavior: Secondary | ICD-10-CM | POA: Diagnosis not present

## 2019-04-05 LAB — GLUCOSE, CAPILLARY: Glucose-Capillary: 144 mg/dL — ABNORMAL HIGH (ref 70–99)

## 2019-04-05 MED ORDER — ACETAMINOPHEN 325 MG PO TABS
650.0000 mg | ORAL_TABLET | Freq: Once | ORAL | Status: AC
  Administered 2019-04-05: 650 mg via ORAL
  Filled 2019-04-05: qty 2

## 2019-04-05 NOTE — ED Notes (Signed)
Hourly rounding reveals patient in room. No complaints, stable, in no acute distress. Q15 minute rounds and monitoring via Security Cameras to continue. 

## 2019-04-05 NOTE — ED Notes (Signed)
Hourly rounding reveals patient sleeping in room. No complaints, stable, in no acute distress. Q15 minute rounds and monitoring via Security Cameras to continue. 

## 2019-04-05 NOTE — ED Notes (Signed)
BEHAVIORAL HEALTH ROUNDING Patient sleeping: No. Patient alert and oriented: yes Behavior appropriate: Yes.  ; If no, describe:  Nutrition and fluids offered: yes Toileting and hygiene offered: Yes  Sitter present: q15 minute observations and security camera monitoring   

## 2019-04-05 NOTE — ED Notes (Signed)
Hourly rounding reveals patient in day room. No complaints, stable, in no acute distress. Q15 minute rounds and monitoring via Security Cameras to continue. 

## 2019-04-05 NOTE — Care Management (Signed)
ED RN CM following patient progress. Patient not appropriate at this time for SNF, ALF, or memory care. RN CM spoke with ED psychiatrist on case and patient improving daily. Anticipate patient will discharge home with home health care. ED RN will continue to follow case and consult with psychiatrist as needed.

## 2019-04-05 NOTE — ED Notes (Signed)
ED BHU [place] Is the patient under IVC or is there intent for IVC: voluntary Is the patient medically cleared: Yes.   Is there vacancy in the ED BHU: Yes.   Is the population mix appropriate for patient: Yes.   Is the patient awaiting placement in inpatient or outpatient setting:   Has the patient had a psychiatric consult: Yes.   Survey of unit performed for contraband, proper placement and condition of furniture, tampering with fixtures in bathroom, shower, and each patient room: Yes.  ; Findings:  APPEARANCE/BEHAVIOR Calm and cooperative NEURO ASSESSMENT Orientation: oriented x3  Denies pain Hallucinations: No.None noted (Hallucinations) denies Speech: Normal Gait: normal RESPIRATORY ASSESSMENT Even  Unlabored respirations  CARDIOVASCULAR ASSESSMENT Pulses equal   regular rate  Skin warm and dry   GASTROINTESTINAL ASSESSMENT no GI complaint EXTREMITIES Full ROM  PLAN OF CARE Provide calm/safe environment. Vital signs assessed twice daily. ED BHU Assessment once each 12-hour shift. Collaborate with TTS daily or as condition indicates. Assure the ED provider has rounded once each shift. Provide and encourage hygiene. Provide redirection as needed. Assess for escalating behavior; address immediately and inform ED provider.  Assess family dynamic and appropriateness for visitation as needed: Yes.  ; If necessary, describe findings:  Educate the patient/family about BHU procedures/visitation: Yes.  ; If necessary, describe findings:

## 2019-04-05 NOTE — ED Provider Notes (Signed)
-----------------------------------------   4:35 AM on 04/05/2019 -----------------------------------------   Blood pressure (!) 146/66, pulse (!) 101, temperature 98.9 F (37.2 C), temperature source Oral, resp. rate 17, height 1.676 m (5\' 6" ), weight 54.4 kg, SpO2 96 %.  The patient is calm and cooperative at this time.  Repeat lab work obtained yesterday morning are normal with no evidence of acute or emergent medical condition.  Awaiting disposition plan from social work and/or behavioral medicine.   Hinda Kehr, MD 04/05/19 [phone]

## 2019-04-05 NOTE — ED Notes (Signed)
Pt observed lying in bed - watching TV   Pt visualized with NAD  No verbalized needs or concerns at this time  Continue to monitor 

## 2019-04-05 NOTE — ED Notes (Signed)
Snack and beverage given. 

## 2019-04-05 NOTE — ED Notes (Signed)

## 2019-04-05 NOTE — ED Notes (Signed)
Turned over care to Amy Teague RN.  

## 2019-04-05 NOTE — ED Notes (Signed)
Report to include Situation, Background, Assessment, and Recommendations received from Amy RN. Patient alert and oriented, warm and dry, in no acute distress. Patient denies SI, HI, AVH and pain. Patient made aware of Q15 minute rounds and security cameras for their safety. Patient instructed to come to me with needs or concerns.  

## 2019-04-06 DIAGNOSIS — R462 Strange and inexplicable behavior: Secondary | ICD-10-CM | POA: Diagnosis not present

## 2019-04-06 LAB — CBC
HCT: 35.2 % — ABNORMAL LOW (ref 36.0–46.0)
Hemoglobin: 11.8 g/dL — ABNORMAL LOW (ref 12.0–15.0)
MCH: 31.4 pg (ref 26.0–34.0)
MCHC: 33.5 g/dL (ref 30.0–36.0)
MCV: 93.6 fL (ref 80.0–100.0)
Platelets: 186 10*3/uL (ref 150–400)
RBC: 3.76 MIL/uL — ABNORMAL LOW (ref 3.87–5.11)
RDW: 13.1 % (ref 11.5–15.5)
WBC: 5.5 10*3/uL (ref 4.0–10.5)
nRBC: 0 % (ref 0.0–0.2)

## 2019-04-06 LAB — COMPREHENSIVE METABOLIC PANEL
ALT: 12 U/L (ref 0–44)
AST: 21 U/L (ref 15–41)
Albumin: 3.7 g/dL (ref 3.5–5.0)
Alkaline Phosphatase: 43 U/L (ref 38–126)
Anion gap: 10 (ref 5–15)
BUN: 16 mg/dL (ref 8–23)
CO2: 24 mmol/L (ref 22–32)
Calcium: 8.7 mg/dL — ABNORMAL LOW (ref 8.9–10.3)
Chloride: 102 mmol/L (ref 98–111)
Creatinine, Ser: 0.72 mg/dL (ref 0.44–1.00)
GFR calc Af Amer: >60 mL/min (ref >60)
GFR calc non Af Amer: >60 mL/min (ref >60)
Glucose, Bld: 115 mg/dL — ABNORMAL HIGH (ref 70–99)
Potassium: 3.1 mmol/L — ABNORMAL LOW (ref 3.5–5.1)
Sodium: 136 mmol/L (ref 135–145)
Total Bilirubin: 0.8 mg/dL (ref 0.3–1.2)
Total Protein: 6.2 g/dL — ABNORMAL LOW (ref 6.5–8.1)

## 2019-04-06 LAB — T4, FREE: Free T4: 1.18 ng/dL — ABNORMAL HIGH (ref 0.61–1.12)

## 2019-04-06 LAB — GLUCOSE, CAPILLARY: Glucose-Capillary: 107 mg/dL — ABNORMAL HIGH (ref 70–99)

## 2019-04-06 MED ORDER — LOPERAMIDE HCL 2 MG PO CAPS
4.0000 mg | ORAL_CAPSULE | Freq: Once | ORAL | Status: AC
  Administered 2019-04-06: 4 mg via ORAL
  Filled 2019-04-06 (×2): qty 2

## 2019-04-06 MED ORDER — POTASSIUM CHLORIDE 20 MEQ PO PACK
40.0000 meq | PACK | Freq: Once | ORAL | Status: AC
  Administered 2019-04-06: 40 meq via ORAL
  Filled 2019-04-06: qty 2

## 2019-04-06 NOTE — ED Notes (Signed)
Hourly rounding reveals patient in room. No complaints, stable, in no acute distress. Q15 minute rounds and monitoring via Security Cameras to continue. 

## 2019-04-06 NOTE — ED Notes (Signed)
Hourly rounding reveals patient sleeping in room. No complaints, stable, in no acute distress. Q15 minute rounds and monitoring via Security Cameras to continue. 

## 2019-04-06 NOTE — ED Notes (Signed)
VOL/Pending Placement 

## 2019-04-06 NOTE — ED Notes (Signed)
Patient came to nurses station door and explained that she was having some explosive diarrhea, patient asked if she could have some medicine to help her stomach relax. Patient did not have any complaints of pain.

## 2019-04-07 DIAGNOSIS — R462 Strange and inexplicable behavior: Secondary | ICD-10-CM | POA: Diagnosis not present

## 2019-04-07 LAB — THYROID PANEL WITH TSH
Free Thyroxine Index: 2.6 (ref 1.2–4.9)
T3 Uptake Ratio: 33 % (ref 24–39)
T4, Total: 7.8 ug/dL (ref 4.5–12.0)
TSH: 5.38 u[IU]/mL — ABNORMAL HIGH (ref 0.450–4.500)

## 2019-04-07 NOTE — ED Notes (Addendum)
OT working with patient. Maintained on 15 minute checks and observation by security camera for safety.

## 2019-04-07 NOTE — Care Management (Addendum)
ED RN CM: LVMM for [name] @ [facility]. Pt expected to be discharged home today.   Second message left for [name] @ [facility]. Will discharge home via cab.

## 2019-04-07 NOTE — Progress Notes (Signed)
Occupational Therapy Treatment Patient Details Name: [name] MRN: [id number] DOB: 06-19-1945 Today's Date: 04/07/2019    History of present illness presented to ER for AMS, management of subacute psychosis.  Recently hospitalized (and familiar to Probation officer) due to metabolic encephalopathy.   OT comments  [name] was seen for OT treatment on this date. Pt received in emergency department behavioral health unit. RN and MD cleared pt to participate in OT session. Pt located in her room on the unit. She reports no pain this date. She states she is feeling generally well, but is ready to go home. Throughout session, she endorses significant concerns over the cleanliness of the unit, but is easily re-directed to tasks/topics at hand. Pt demonstrates good safety awareness and sequencing of tasks during functional mobility oral care this date. Pt and provider problem solved strategies to support pt's ability to manage IADL tasks at home given the supports she plans to have in place including her personal care attendants and primary care team. Pt endorses that she utilizes a daily calendar along with her medication list to ensure she takes her daily medications approriately. Given minimal prompting, pt is able to identify appropriate places to look for prescribed medication dosage amounts and administration instructions including on a medication bottle or on her medication list from her doctor. Overall pt appears to demonstrate improved safety awareness, deficit awareness, and functional reasoning/sequencing (see below for additional detail). At this time, pt appears to have met all acute occupational therapy goals. Upon hospital discharge, recommend pt discharge home with intermittent supervision to support pt safety and continued adherence to medication regimen. Will discharge in house. Please re-consult if additional OT needs arise during this admission.     Follow Up Recommendations  Supervision -  Intermittent    Equipment Recommendations  None recommended by OT    Recommendations for Other Services      Precautions / Restrictions Precautions Precautions: Fall Restrictions Weight Bearing Restrictions: No       Mobility Bed Mobility Overal bed mobility: Independent             General bed mobility comments: pt up walking around when OT presents  Transfers Overall transfer level: Independent Equipment used: None             General transfer comment: sit/stand from various surface heights (bed, standard chair) without assist device.    Balance Overall balance assessment: No apparent balance deficits (not formally assessed);Independent                                         ADL either performed or assessed with clinical judgement   ADL Overall ADL's : At baseline                                       General ADL Comments: Pt completes oral care in room/bathroom. She endorses significant fears over "all the germs" in the shared bathroom on the behavioral health unit and elects not to use the sink or water to brush her teeth. Rather, she stands at the mirror to complete brushing than ambulates back to her room and uses a small cup of juice to rinse her mouth and toothbrush. She elects to swallow all toothpaste. She states this is not her typical routine for teeth brushing,  but prefers it this way while at the hospital to avoid germs. Per nursing staff on the unit, pt has been independent with ADL management.     Vision Baseline Vision/History: Wears glasses Wears Glasses: At all times Patient Visual Report: No change from baseline     Perception     Praxis      Cognition Arousal/Alertness: Awake/alert Behavior During Therapy: WFL for tasks assessed/performed Overall Cognitive Status: No family/caregiver present to determine baseline cognitive functioning                                 General  Comments: Pt alert and oriented to self, place, and situation. Is pleasant and cooperative throughout OT sesison. She speaks tangetially at times, but is generally able to follow conversation appropriately and follows multi-step VCs consistently. Requires re-direction to task/topic at hand, and often endorses significant concerns about her personal health and safety in the hospital with endorsed fears over "all the germs" in the common areas of her unit.        Exercises Other Exercises Other Exercises: Pt demonstrates improved ability to discuss discharge planning and strategies for IADL management including: meal prep, grocery shopping, and medication management with therapist this date. She demonstrates improved safety awareness and is able to express a plan for ongoing medication management including using her medication list from her medical provider, a daily calendar, and from her personal care attendants to ensure safety.   Shoulder Instructions       General Comments      Pertinent Vitals/ Pain       Pain Assessment: No/denies pain  Home Living                                          Prior Functioning/Environment              Frequency           Progress Toward Goals  OT Goals(current goals can now be found in the care plan section)  Progress towards OT goals: Goals met/education completed, patient discharged from OT  Acute Rehab OT Goals Patient Stated Goal: to do better OT Goal Formulation: All assessment and education complete, DC therapy Potential to Achieve Goals: Good  Plan Discharge plan needs to be updated;All goals met and education completed, patient discharged from OT services    Co-evaluation                 AM-PAC OT "6 Clicks" Daily Activity     Outcome Measure   Help from another person eating meals?: None Help from another person taking care of personal grooming?: None Help from another person toileting, which  includes using toliet, bedpan, or urinal?: None Help from another person bathing (including washing, rinsing, drying)?: A Little Help from another person to put on and taking off regular upper body clothing?: None Help from another person to put on and taking off regular lower body clothing?: None 6 Click Score: 23    End of Session    OT Visit Diagnosis: Other abnormalities of gait and mobility (R26.89)   Activity Tolerance Patient tolerated treatment well   Patient Left in chair;Other (comment)(It common sitting room on behavioral health unit with unit staff able to observe pt for safety.)   Nurse Communication  Time: 7871-8367 OT Time Calculation (min): 26 min  Charges: OT General Charges $OT Visit: 1 Visit OT Treatments $Self Care/Home Management : 23-37 mins  Shara Blazing, M.S., OTR/L Ascom: [phone] 04/07/19, 1:36 PM

## 2019-04-07 NOTE — ED Provider Notes (Signed)
-----------------------------------------   11:24 AM on 04/07/2019 -----------------------------------------  Patient has been being treated by psychiatry here in the [facility]. Over the past several days the patient mental status has cleared and the patient is back to her normal baseline.  I do not believe she needs to be admitted at this time and believe she is appropriate for discharge home from a psychiatric standpoint.  Patient's medical work-up thus far has been largely nonrevealing.  Thyroid studies are largely normal.  We will discharge with outpatient resources.   Harvest Dark, MD 04/07/19 1125

## 2019-04-07 NOTE — ED Notes (Signed)
Pt discharged home with home health set up by nurse case manager.  [facility] also aware of patient's discharge.

## 2019-04-07 NOTE — ED Notes (Signed)
Pt discharged home in cab with voucher. Home health care in place. VS stable. [facility] aware of patient's disposition.  All belongings sent home with patient. Pt denies SI/HI. Discharge instructions reviewed with patient.

## 2019-04-07 NOTE — ED Notes (Signed)
RN left messages for Christel Mormon ([facility]) and Ms. Ronne Binning [facility]) requesting return phone calls about the patient's transportation home.  BHU contact number left for return call.

## 2019-04-07 NOTE — ED Notes (Signed)
Pt will be discharged home in a cab (voucher).

## 2019-04-07 NOTE — Care Management (Signed)
ED RN CM: Incoming call from [place] confirmed they would be resuming services.

## 2019-04-07 NOTE — ED Provider Notes (Signed)
-----------------------------------------   5:46 AM on 04/07/2019 -----------------------------------------   Blood pressure (!) 141/73, pulse 79, temperature 98.5 F (36.9 C), temperature source Oral, resp. rate 18, height 5\' 6"  (1.676 m), weight 54.4 kg, SpO2 99 %.  The patient is sleeping at this time.  There have been no acute events since the last update.  Awaiting disposition plan from Behavioral Medicine and/or Social Work team(s).   Paulette Blanch, MD 04/07/19 [phone]

## 2019-04-11 DIAGNOSIS — G9341 Metabolic encephalopathy: Secondary | ICD-10-CM | POA: Diagnosis not present

## 2019-04-11 DIAGNOSIS — E119 Type 2 diabetes mellitus without complications: Secondary | ICD-10-CM | POA: Diagnosis not present

## 2019-04-11 DIAGNOSIS — Z09 Encounter for follow-up examination after completed treatment for conditions other than malignant neoplasm: Secondary | ICD-10-CM | POA: Diagnosis not present

## 2019-04-11 DIAGNOSIS — E039 Hypothyroidism, unspecified: Secondary | ICD-10-CM | POA: Diagnosis not present

## 2019-04-11 DIAGNOSIS — Z7984 Long term (current) use of oral hypoglycemic drugs: Secondary | ICD-10-CM | POA: Diagnosis not present

## 2019-04-11 DIAGNOSIS — J449 Chronic obstructive pulmonary disease, unspecified: Secondary | ICD-10-CM | POA: Diagnosis not present

## 2019-04-11 DIAGNOSIS — Z79899 Other long term (current) drug therapy: Secondary | ICD-10-CM | POA: Diagnosis not present

## 2019-04-19 DIAGNOSIS — M4800 Spinal stenosis, site unspecified: Secondary | ICD-10-CM | POA: Diagnosis not present

## 2019-04-20 DIAGNOSIS — M4800 Spinal stenosis, site unspecified: Secondary | ICD-10-CM | POA: Diagnosis not present

## 2019-04-21 DIAGNOSIS — M4800 Spinal stenosis, site unspecified: Secondary | ICD-10-CM | POA: Diagnosis not present

## 2019-04-24 DIAGNOSIS — M4800 Spinal stenosis, site unspecified: Secondary | ICD-10-CM | POA: Diagnosis not present

## 2019-04-25 DIAGNOSIS — I471 Supraventricular tachycardia: Secondary | ICD-10-CM | POA: Diagnosis not present

## 2019-04-25 DIAGNOSIS — M4800 Spinal stenosis, site unspecified: Secondary | ICD-10-CM | POA: Diagnosis not present

## 2019-04-25 DIAGNOSIS — Z7984 Long term (current) use of oral hypoglycemic drugs: Secondary | ICD-10-CM | POA: Diagnosis not present

## 2019-04-25 DIAGNOSIS — E119 Type 2 diabetes mellitus without complications: Secondary | ICD-10-CM | POA: Diagnosis not present

## 2019-04-25 DIAGNOSIS — M810 Age-related osteoporosis without current pathological fracture: Secondary | ICD-10-CM | POA: Diagnosis not present

## 2019-04-25 DIAGNOSIS — E039 Hypothyroidism, unspecified: Secondary | ICD-10-CM | POA: Diagnosis not present

## 2019-04-25 DIAGNOSIS — E782 Mixed hyperlipidemia: Secondary | ICD-10-CM | POA: Diagnosis not present

## 2019-04-25 DIAGNOSIS — J449 Chronic obstructive pulmonary disease, unspecified: Secondary | ICD-10-CM | POA: Diagnosis not present

## 2019-04-25 DIAGNOSIS — Z79899 Other long term (current) drug therapy: Secondary | ICD-10-CM | POA: Diagnosis not present

## 2019-04-26 DIAGNOSIS — M4800 Spinal stenosis, site unspecified: Secondary | ICD-10-CM | POA: Diagnosis not present

## 2019-04-27 DIAGNOSIS — M4800 Spinal stenosis, site unspecified: Secondary | ICD-10-CM | POA: Diagnosis not present

## 2019-04-28 DIAGNOSIS — M4800 Spinal stenosis, site unspecified: Secondary | ICD-10-CM | POA: Diagnosis not present

## 2019-04-29 DIAGNOSIS — M4800 Spinal stenosis, site unspecified: Secondary | ICD-10-CM | POA: Diagnosis not present

## 2019-04-30 DIAGNOSIS — M4800 Spinal stenosis, site unspecified: Secondary | ICD-10-CM | POA: Diagnosis not present

## 2019-05-01 DIAGNOSIS — M4800 Spinal stenosis, site unspecified: Secondary | ICD-10-CM | POA: Diagnosis not present

## 2019-05-02 DIAGNOSIS — M4800 Spinal stenosis, site unspecified: Secondary | ICD-10-CM | POA: Diagnosis not present

## 2019-05-03 DIAGNOSIS — M4800 Spinal stenosis, site unspecified: Secondary | ICD-10-CM | POA: Diagnosis not present

## 2019-05-04 DIAGNOSIS — R69 Illness, unspecified: Secondary | ICD-10-CM | POA: Diagnosis not present

## 2019-05-04 DIAGNOSIS — M4800 Spinal stenosis, site unspecified: Secondary | ICD-10-CM | POA: Diagnosis not present

## 2019-05-05 DIAGNOSIS — M4800 Spinal stenosis, site unspecified: Secondary | ICD-10-CM | POA: Diagnosis not present

## 2019-05-08 DIAGNOSIS — M4800 Spinal stenosis, site unspecified: Secondary | ICD-10-CM | POA: Diagnosis not present

## 2019-05-09 DIAGNOSIS — M4800 Spinal stenosis, site unspecified: Secondary | ICD-10-CM | POA: Diagnosis not present

## 2019-05-10 DIAGNOSIS — M4800 Spinal stenosis, site unspecified: Secondary | ICD-10-CM | POA: Diagnosis not present

## 2019-05-11 DIAGNOSIS — M4800 Spinal stenosis, site unspecified: Secondary | ICD-10-CM | POA: Diagnosis not present

## 2019-05-12 DIAGNOSIS — M4800 Spinal stenosis, site unspecified: Secondary | ICD-10-CM | POA: Diagnosis not present

## 2019-05-13 DIAGNOSIS — M4800 Spinal stenosis, site unspecified: Secondary | ICD-10-CM | POA: Diagnosis not present

## 2019-05-14 DIAGNOSIS — M4800 Spinal stenosis, site unspecified: Secondary | ICD-10-CM | POA: Diagnosis not present

## 2019-05-15 DIAGNOSIS — M4800 Spinal stenosis, site unspecified: Secondary | ICD-10-CM | POA: Diagnosis not present

## 2019-05-16 DIAGNOSIS — M4800 Spinal stenosis, site unspecified: Secondary | ICD-10-CM | POA: Diagnosis not present

## 2019-05-17 DIAGNOSIS — M4800 Spinal stenosis, site unspecified: Secondary | ICD-10-CM | POA: Diagnosis not present

## 2019-05-18 DIAGNOSIS — M4800 Spinal stenosis, site unspecified: Secondary | ICD-10-CM | POA: Diagnosis not present

## 2019-05-26 ENCOUNTER — Other Ambulatory Visit: Payer: Self-pay | Admitting: *Deleted

## 2019-05-26 DIAGNOSIS — J441 Chronic obstructive pulmonary disease with (acute) exacerbation: Secondary | ICD-10-CM

## 2019-05-26 NOTE — Patient Outreach (Signed)
Member screened for potential [facility] needs as a benefit of [employer] ACO.  Per Patient [name] member has had multiple ED visits/hospitalizations. Has had 5 ED/hospitalizations visits in the past 6 months.   Telephone call made to [name] at [phone]. Patient identifiers confirmed. Explained [facility] Care Management services. [name] is agreeable. She endorses she has a history COPD, DM, and chronic depression.   States she is active with [facility] that is a mental health care management group that also assists with housing. [name] lives alone. States her brother in law assists with buying groceries. States she has [employer] aide services for 2 hrs a day 7 days a week. She has transportation assistance thru [place] and could utilize DSS transportation.  [name] states she is getting over a cold currently. States she is taking her medications and inhaler properly. States she only has a land line which prevents her from doing virtual visits.   [name] expressed concern that her medical alert from [employer] requires to be charged twice a day. States she does not have a number to call to request a new one or ask questions. Nor does she know how to operate it. Education officer, environmental for [employer] case worker to make aware of member's concerns. Writer also suggested member to contact the number on the back of her id card.   Confirmed Primary Care Provider is [facility], PA-C.   Confirmed best contact number for [name] at [phone].   Will make referral for [facility] Care Management RNCM for care coordination and COPD and DM disease management.  Raiford Noble, MSN-Ed, RN,BSN [facility] Post Acute Care Coordinator [phone] [facility]) [phone]  (Toll free office)

## 2019-05-30 ENCOUNTER — Other Ambulatory Visit: Payer: Self-pay | Admitting: *Deleted

## 2019-05-30 NOTE — Patient Outreach (Addendum)
[facility]) Care Management [place] Telephone Outreach  05/30/2019  [name] 06-09-1945 [id number]  Successful telephone outreach to [name], 74 y/o female referred to [place] by [facility] for multiple hospital ED/ visits; patient had ED visit 03/25/2019 at which time patient had psychiatric evaluation/ admission and was eventually discharged home to self-care.  Patient has history including, but not limited to, PTSD with anxiety and depression; DDD with fibromyalgia/ chronic pain; COPD; DM- type II; and HLD.  HIPAA/ identity verified with patient today and [facility] RN CM services were discussed with patient; patient provides verbal consent for [facility] CM involvement of her care and states that all of her chronic conditions are "very well managed," she identifies COPD as her biggest area that she would like to improve.  Today, patient reports doing "great" and she reports "usual" chronic pain; she sounds to be in no distress throughout entirety of phone call today.  Patient further reports:  Medications: -- Has all medicationsand takes as prescribed;denies questions/ concerns around current medications.  -- Verbalizes good general understanding of the purpose, dosing, and scheduling of medications.   -- self-manage medications; takes daily medications from prescription bottles -- denies issues with swallowing medications -- medication review completed with patient today and no concerns were identified  Medications Reviewed Today    Reviewed by Daune Perch, CPhT (Pharmacy Technician) on 03/25/19 at 2232  Med List Status: Complete  Medication Order Taking? Sig Documenting Provider Last Dose Status Informant  albuterol (PROVENTIL HFA;VENTOLIN HFA) 108 (90 Base) MCG/ACT inhaler [id number] Yes Inhale 2 puffs into the lungs every 6 (six) hours as needed for wheezing or shortness of breath. Epifanio Lesches, MD prn prn Active Multiple Informants  alendronate  (FOSAMAX) 70 MG tablet [id number] Yes Take 70 mg by mouth once a week. [provider] unknown unknown Active Multiple Informants  aspirin 81 MG tablet [id number] Yes Take 81 mg by mouth daily. [provider] unknown unknown Active Multiple Informants  busPIRone (BUSPAR) 7.5 MG tablet [id number] Yes Take 7.5 mg by mouth 2 (two) times daily. [provider] unknown unknown Active Multiple Informants           Med Note Luan Pulling, CHRISTINA   Fri Mar 17, 2019 11:05 AM)    calcium-vitamin D (CALCIUM 500/D) 500-200 MG-UNIT tablet [id number] Yes Take 1 tablet by mouth daily. [provider] unknown unknown Active Multiple Informants  FLUoxetine (PROZAC) 20 MG capsule [id number] Yes Take 1 capsule (20 mg total) by mouth daily. Terrilee Croak, MD unknown unknown Active   fluticasone (FLONASE) 50 MCG/ACT nasal spray [id number] Yes 2 sprays daily. [provider] unknown unknown Active Multiple Informants  hydrocortisone (ANUSOL-HC) 2.5 % rectal cream [id number] Yes Place 1 application rectally 3 (three) times daily as needed for hemorrhoids or itching. Reported on 12/02/2015 [provider] prn prn Active Multiple Informants  levothyroxine (SYNTHROID) 112 MCG tablet [id number] Yes Take 1 tablet (112 mcg total) by mouth daily before breakfast. Terrilee Croak, MD unknown unknown Active   LORazepam (ATIVAN) 0.5 MG tablet [id number] Yes Take 0.5 mg by mouth at bedtime. [provider] unknown unknown Active Multiple Informants  lubiprostone (AMITIZA) 8 MCG capsule [id number] Yes Take 8 mcg by mouth 2 (two) times daily with a meal. [provider] unknown unknown Active Multiple Informants  Melatonin 3 MG TABS [id number] Yes Take 3 mg by mouth at bedtime. [provider] unknown unknown Active Multiple Informants  Med Note Juanetta Gosling, CHRISTINA   Fri Mar 17, 2019 11:06 AM)    metFORMIN (GLUCOPHAGE) 500 MG tablet [id number] Yes Take 500 mg by  mouth daily with breakfast.  [provider] unknown unknown Active Multiple Informants  ondansetron (ZOFRAN ODT) 4 MG disintegrating tablet [id number] Yes Take 1 tablet (4 mg total) by mouth every 8 (eight) hours as needed for nausea or vomiting. Emily Filbert, MD prn prn Active Multiple Informants  Potassium 99 MG TABS [id number] Yes Take 1 tablet by mouth daily. [provider] unknown unknown Active Multiple Informants  simvastatin (ZOCOR) 20 MG tablet [id number] Yes Take 20 mg by mouth daily at 6 PM. [provider] unknown unknown Active Multiple Informants  SYMBICORT 160-4.5 MCG/ACT inhaler [id number] Yes Inhale 2 puffs into the lungs 2 (two) times daily. [provider] unknown unknown Active Multiple Informants  tiotropium (SPIRIVA) 18 MCG inhalation capsule [id number] Yes Place 18 mcg into inhaler and inhale daily. Reported on 08/07/2015 [provider] unknown unknown Active Multiple Informants           Med Note Jobie Quaker   Tue Mar 14, 2019  1:19 PM)    Med List Note Harless Nakayama, [place] 01/07/16 1102): meds due 12-06-15 meds due      9/21/17Uds done 08/07/15           Provider appointments: -- All recent/ upcoming provider appointments were reviewed with patient today; patient verbalizes accurate understanding of provider appointments; encouraged patient to schedule follow up annual appointment with her pulmonology provider; patient verbalizes agreement and states she will do  Safety/ Mobility/ Falls: -- denies new/ recent falls; states has had no falls in over 2 years -- assistive devices: does not use on a regular basis -- general fall risks/ prevention education discussed with patient today  Social/ Community Resource needs: -- currently denies community resource needs, reports has an Diplomatic Services operational officer"  that assists her with care needs as indicated through her medicaid benefit; states aide visits her daily, seven days a week for  3-4 hours each day -- reports independent with ADL's and iADL's, "for the most part;" reports that on days that her chronic pain is "bad," that her aide assist with dressing/ bathing, but otherwise mainly helps her with household management and transportation -- has established community resources in place for transportation to provider appointments, errands, etc -- is active with "[facility]" that has multiple benefits to assist her, including transportation; states she has been a part of this program "for several years now," and verbalizes understanding of process to schedule transportation -- has 2 adult children, one local which can assist with care needs as indicated- however, reports that she and her daughter have agreed to having less in-person contact due to corona virus precautions -- SDOH completed for: depression/ transportation/ food insecurity: no concerns identified  Advanced Directive (AD) Planning:   --reports does not currently have exisisting AD in place, but has been working with her "spiritual advisor" to develop these documents; endorses that she is a full code, "with limitations, depending on circumstances; reports that her advanced directive planning documents should be finalized "within the 2 weeks." Basics of Advanced Directive planning were discussed with patient, who verbalizes good understanding of same  Self-health management of chronic disease states of DM/ COPD/ chronic pain/ and depression: -- reports chronic conditions "under good control;" denies care coordination needs -- accurately reports last A1-C as "5.8" and reports blood sugars at home consistently between "100-120;" reports  checks blood sugars 1-2 times per day -- reports has established exercise program that she is adherent to- walks outside regularly depending on weather; states this "helps more than anything" with control of her chronic conditions -- followed by pain management clinic and outpatient  psychiatry providers- endorses adherence to attending all provider appointments -- reports good general understanding of action plan for COPD yellow zone/ flare; states that when she follows action plan of rest/ rescue medications/ contacting provider her flares usually resolve within 1-2 days  Patient denies further issues, concerns, or problems today.  I provided/ confirmed that patient has my direct phone number, the main [facility] CM office phone number, and the [facility] CM 24-hour nurse advice phone number should issues arise prior to next scheduled [facility] Community CM outreach by phone next month.  Encouraged patient to contact me directly if needs, questions, issues, or concerns arise prior to next scheduled outreach; patient agreed to do so.  Plan:  Patient will take medications as prescribed and will attend all scheduled provider appointments  Patient will promptly schedule annual office visit with pulmonary care provider  Patient will promptly notify care providers for any new concerns/ issues/ problems that arise  Patient will continue established daily routines for self-health monitoring including blood sugar monitoring and exercise program   I will make patient's PCP aware of [facility] Community RN CM involvement in patient's care-- will send barriers letter and will send today's [facility] CM note/ medication list/ and care plan as initial assessment  [facility] Community CM outreach to continue with scheduled phone call next month, unless indicated sooner  [facility] CM Care Plan Problem One     Most Recent Value  Care Plan Problem One  Self- health management of chronic disease state of COPD as evidenced by patient reporting  Role Documenting the Problem One  Care Management Coordinator  Care Plan for Problem One  Active  [facility] Long Term Goal   Over the next 60 days, patient will attend all scheduled provider appointments, as evidenced by patient reporting and collaboration with care providers as indicated during [facility]  RN CM outreach  [facility] Long Term Goal Start Date  05/30/19  Interventions for Problem One Long Term Goal  Discussed recent upcoming provider appointments with patient and encouraged her to make prompt appointments with her PCP and pulmonary providers,  confirmed that patient has reliable transportation in place through established community resources,  completed [facility] RN CM initial assessment and sent via secure messaging to patient's PCP  [facility] CM Short Term Goal #1   Over the next 30 days, patient will independently verbalize signs/ symptoms COPD yellow zone along with correspinding action plan for same, as evidenced by patient reporting during [facility] RN CM outreach  [facility] CM Short Term Goal #1 Start Date  05/30/19  Interventions for Short Term Goal #1  Discussed patient's usual symptoms for COPD flares, as well as her usual plan of action for same,  confirmed that patient has good baseline understanding of overall plan of action for COPD flare and that she understands to promptly notify care providers for any new concerns/ issues/ problems  [facility] CM Short Term Goal #2   Over the next 30 days, patient will continue to maintain her established activity/ exercise program/ routine, as evidenced by patient reporting during [facility] RN CM outreach  [facility] CM Short Term Goal #2 Start Date  05/30/19  Interventions for Short Term Goal #2  Discussed with patient her current activity/ exercise program/ routine,  discussed benefits of activity/ exercise with patient, need to pace herself if/ when she becomes short of breath with activity,  use of inhalers with COPD flares,  encouraged patient to modify exercise program for episodes when she feels that she may be developing flare     I appreciate the opportunity to participate in [name]'s care,  Caryl Pina, RN, BSN, [employer] Coordinator [facility] Care Management  [phone]

## 2019-06-07 DIAGNOSIS — E782 Mixed hyperlipidemia: Secondary | ICD-10-CM | POA: Diagnosis not present

## 2019-06-07 DIAGNOSIS — I6523 Occlusion and stenosis of bilateral carotid arteries: Secondary | ICD-10-CM | POA: Diagnosis not present

## 2019-06-07 DIAGNOSIS — I471 Supraventricular tachycardia: Secondary | ICD-10-CM | POA: Diagnosis not present

## 2019-06-07 DIAGNOSIS — R0602 Shortness of breath: Secondary | ICD-10-CM | POA: Diagnosis not present

## 2019-06-28 ENCOUNTER — Other Ambulatory Visit: Payer: Self-pay | Admitting: *Deleted

## 2019-06-28 ENCOUNTER — Encounter: Payer: Self-pay | Admitting: *Deleted

## 2019-06-28 NOTE — Patient Outreach (Signed)
[name]) Care Management [place] Telephone Outreach  06/28/2019  [name] [birth year] [id number]  Successful telephone outreach to [name], 74 y/o female referred to [place] by [facility] for multiple hospital ED/ visits; patient had ED visit 03/25/2019 at which time patient had psychiatric evaluation/ admission and was eventually discharged home to self-care.  Patient has history including, but not limited to, PTSD with anxiety and depression; DDD with fibromyalgia/ chronic pain; COPD; DM- type II; and HLD.  HIPAA/ identity verified and patient reports "doing really well;" she denies pain outside of her normal chronic levels and denies new/ recent falls.  She sounds to be in no distress throughout phone call today.   Patient further reports:  -- attended recent cardiology provider appointment; confirms no changes made to medications/ overall plan of care; waiting to hear back to schedule for recommended ECHOcardiogram  -- no other changes to medications- continues to self-manage; denies medication concerns  -- continues using established transportation resource; continues having personal care services in place every day; remains essentially independent in ADL's  -- Self-health management of chronic disease states of DM/ COPD/ chronic pain/ and depression: ---- denies recent clinical concerns; states things have been "going very well" ---- has continued exercising each week, "as much as possible around weather;" reports usually "gets at least 4 days of walking in"- positive reinforcement provided ---- denies need to use rescue inhaler recently; stated that she went to grocery store one day last week, and "got very tired and short of breath," however, said that she followed action plan of "stopping and resting" which resolved episode; reviewed general action plan for episodes shortness of breath with patient using teach back method ---- reports that she  has inquired with her PCP about ordering a shower bench for her but has not yet heard back; discussed with patient that I am not sure if this requires a MD order, and she is not sure whether or not her insurance covers DME for shower bench- I encouraged her to call/ inquire about her individual coverage for DME, and I will reach out to her PCP to reinforce her request -- continues monitoring and recording blood sugars twice daily- fasting and and 2 hours after supper-- reports ranges for both values consistently between "99- 110;" with fasting blood sugar this morning of "107"    Patient denies further issues, concerns, or problems today. I confirmed that patient hasmy direct phone number, the main [facility] CM office phone number, and the [facility] CM 24-hour nurse advice phone number should issues arise prior to next scheduled [place] outreach by phone next month.  Encouraged patient to contact me directly if needs, questions, issues, or concerns arise prior to next scheduled outreach; patient agreed to do so.  Plan:  Patient will take medications as prescribed and will attend all scheduled provider appointments  Patient will schedule annual office visit with pulmonary care provider  Patient will promptly notify care providers for any new concerns/ issues/ problems that arise  Patient will continue established daily routines for self-health monitoring including blood sugar monitoring and exercise program   I will inquire with patient's PCP about her request to have order for shower bench  I will place printed educational material in mail to patient around diet for DM and COPD; benefits of exercise  [facility] Community CM outreach to continue with scheduled phone call next month, unless indicated sooner  [facility] CM Care Plan Problem One  Most Recent Value  Care Plan Problem One  Self- health management of chronic disease state of COPD as evidenced by patient reporting  Role Documenting the  Problem One  Care Management [place] for Problem One  Active  [facility] Long Term Goal   Over the next 60 days, patient will attend all scheduled provider appointments, as evidenced by patient reporting and collaboration with care providers as indicated during [facility] RN CM outreach  [facility] Term Goal Start Date  05/30/19  Interventions for Problem One Long Term Goal  Reviewed recent cardiology provider appointment with patient,  confirmed that no changes were made to patient's medications/ overall plan of care,  discussed upcoming appointments and confirmed that patient continues using established transportation resource  [facility] CM Short Term Goal #1   Over the next 30 days, patient will independently verbalize signs/ symptoms COPD yellow zone along with correspinding action plan for same, as evidenced by patient reporting during [facility] RN CM outreach  [facility] CM Short Term Goal #1 Start Date  05/30/19  [facility] CM Short Term Goal #1 Met Date  06/28/19 [Goal Met]  Interventions for Short Term Goal #1  Using teach back method, reiterated previously provided education around self-health management COPD/ action plan,  discussed with patient recent episode of shortness of breath and action plan take to resolve- positive reinforcement provided,  discussed use of inhalers with patient  [facility] CM Short Term Goal #2   Over the next 30 days, patient will continue to maintain her established activity/ exercise program/ routine, as evidenced by patient reporting during [facility] RN CM outreach  [facility] CM Short Term Goal #2 Start Date  05/30/19  [facility] CM Short Term Goal #2 Met Date  06/28/19 [Goal Met]  Interventions for Short Term Goal #2  Confirmed that patient continues incorporating exercise into her weekly routine,  discussed her exercise habits and encouraged her to continue this important practice- positive reinforcement provided     Oneta Rack, RN, BSN, [place] Coordinator [facility] Care Management   [phone]

## 2019-06-29 ENCOUNTER — Other Ambulatory Visit: Payer: Self-pay | Admitting: *Deleted

## 2019-06-29 NOTE — Patient Outreach (Addendum)
[facility]) Care Management [facility] Telephone [facility] Coordination  06/29/2019  [name] [birth year] [id number]  [facility] Community CM Telephone Outreach Care Coordination re:  [name], 74 y/o female referred to [facility] RN CM by [facility] for multiple hospital ED/ visits; patient had ED visit 11/07/2020at which time patient had psychiatric evaluation/ admission and was eventually discharged home to self-care.Patient has history including, but not limited to, PTSD with anxiety and depression; DDD with fibromyalgia/ chronic pain; COPD; DM- type II; and HLD.  As requested by patient during [facility] RN CM outreach yesterday, contacted patient's PCP [phone]) and spoke with "Arline Asp;" shared with Cinday patient's request to receive follow up around MD/ PCP order for shower bench; explained that patient reported having asked PA Debbra Riding about this on previous occasions and would like follow up, as she reports feeling off-balance when she showers.  Also shared with cindy that I did encourage patient yesterday to also contact her insurance provider to obtain specific coverage for the requested DME.  10:35 am:  Received voice mail from PCP office, [facility], requesting information as to where DME for shower bench order should be faxed; encouraged Riandria to follow up with patient as to her preference for DME company; Riandria agrees to contact patient  Plan:  Will collaborate with patient's care provider for ongoing care coordination as indicated  Caryl Pina, RN, BSN, [employer] Coordinator [facility] Care Management  [phone]

## 2019-06-30 DIAGNOSIS — E039 Hypothyroidism, unspecified: Secondary | ICD-10-CM | POA: Diagnosis not present

## 2019-06-30 DIAGNOSIS — G8929 Other chronic pain: Secondary | ICD-10-CM | POA: Diagnosis not present

## 2019-06-30 DIAGNOSIS — E119 Type 2 diabetes mellitus without complications: Secondary | ICD-10-CM | POA: Diagnosis not present

## 2019-06-30 DIAGNOSIS — J449 Chronic obstructive pulmonary disease, unspecified: Secondary | ICD-10-CM | POA: Diagnosis not present

## 2019-06-30 DIAGNOSIS — J309 Allergic rhinitis, unspecified: Secondary | ICD-10-CM | POA: Diagnosis not present

## 2019-06-30 DIAGNOSIS — G4700 Insomnia, unspecified: Secondary | ICD-10-CM | POA: Diagnosis not present

## 2019-06-30 DIAGNOSIS — I509 Heart failure, unspecified: Secondary | ICD-10-CM | POA: Diagnosis not present

## 2019-06-30 DIAGNOSIS — E785 Hyperlipidemia, unspecified: Secondary | ICD-10-CM | POA: Diagnosis not present

## 2019-06-30 DIAGNOSIS — R69 Illness, unspecified: Secondary | ICD-10-CM | POA: Diagnosis not present

## 2019-06-30 DIAGNOSIS — K219 Gastro-esophageal reflux disease without esophagitis: Secondary | ICD-10-CM | POA: Diagnosis not present

## 2019-07-19 DIAGNOSIS — R69 Illness, unspecified: Secondary | ICD-10-CM | POA: Diagnosis not present

## 2019-07-20 ENCOUNTER — Ambulatory Visit: Payer: Medicare HMO | Attending: Internal Medicine

## 2019-07-20 DIAGNOSIS — Z23 Encounter for immunization: Secondary | ICD-10-CM | POA: Insufficient documentation

## 2019-07-20 NOTE — Progress Notes (Signed)
   Covid-19 Vaccination Clinic  Name:  [name]    MRN: [id number] DOB: [birth year]  07/20/2019  Ms. Weinmann was observed post Covid-19 immunization for 15 minutes without incident. She was provided with Vaccine Information Sheet and instruction to access the V-Safe system.   Ms. Ludwig was instructed to call 911 with any severe reactions post vaccine: Marland Kitchen Difficulty breathing  . Swelling of face and throat  . A fast heartbeat  . A bad rash all over body  . Dizziness and weakness   Immunizations Administered    Name Date Dose VIS Date Route   Pfizer COVID-19 Vaccine 07/20/2019 10:14 AM 0.3 mL 04/28/2019 Intramuscular   Manufacturer: [employer], [employer]   Lot: [id number]   NDC: [address]-9

## 2019-07-26 DIAGNOSIS — H524 Presbyopia: Secondary | ICD-10-CM | POA: Diagnosis not present

## 2019-07-26 DIAGNOSIS — E083291 Diabetes mellitus due to underlying condition with mild nonproliferative diabetic retinopathy without macular edema, right eye: Secondary | ICD-10-CM | POA: Diagnosis not present

## 2019-07-26 DIAGNOSIS — E113291 Type 2 diabetes mellitus with mild nonproliferative diabetic retinopathy without macular edema, right eye: Secondary | ICD-10-CM | POA: Diagnosis not present

## 2019-07-26 DIAGNOSIS — H2513 Age-related nuclear cataract, bilateral: Secondary | ICD-10-CM | POA: Diagnosis not present

## 2019-07-31 ENCOUNTER — Other Ambulatory Visit: Payer: Self-pay | Admitting: *Deleted

## 2019-07-31 NOTE — Patient Outreach (Signed)
[facility] Care Management [place] Telephone Outreach  07/31/2019  [name] January 31, 1946 [id number]  Successful telephoneoutreachto [name], 74 y/o female referred to [place] by [facility] for multiple hospital ED/ visits; patient had ED visit 11/07/2020at which time patient had psychiatric evaluation/ admission and was eventually discharged home to self-care.Patient has history including, but not limited to, PTSD with anxiety and depression; DDD with fibromyalgia/ chronic pain; COPD; DM- type II; and HLD.  HIPAA/ identity verified and patient again reports "doing great;" she reports ongoing chronic pain at "normal" level and denies new/ recent falls.  Reports she obtained her first corona virus vaccine and "tolerated without any problems."  She sounds to be in no distress throughout phone call today, and states that due to the pollen in the air, she has a slight runny nose at night and post-nasal drip- states taking allergy medication as prescribed; states "this happens to me every year."     Patient further reports:  -- no recent provider appointments-- did receive first corona virus vaccine; verbalizes accurate report of next scheduled dose with plans to attend  -- no changes to medications- continues to self-manage; denies medication concerns, but states that she is not sure that she "should be on metformin" as her A1-C "is very well controlled" and she has noticed a slow decrease in her weight- encouraged patient to make PCP appointment to review medications, get updated A1-C, and discuss treatment options  -- continues using established transportation resource; continues having personal care services in place every day; remains essentially independent in ADL's  -- Self-health management ofchronic disease states of DM/ COPD/ chronic pain/ and depression: ---- denies recent clinical concerns; states things have been "going very well;" "feeling  great, no problems" ---- has continued exercising each week, "as much as possible around weather;" reports usually "gets at least 4 days of walking in"- positive reinforcement provided ---- able to accurately verbalize appropriate established action plan for COPD/ shortness of breath: pace activities/ stop and rest when short of breath or winded/ use rescue inhaler as needed/ continue using maintenance inhaler as prescribed; promptly contact care providers for any new concerns/ issues/ problems that are not resolved by initiation of action plan ---- denies need to use rescue inhaler recently; endorses adherence to following instructions in use of maintenance inhaler ---- continues monitoring and recording blood sugars twice daily- fasting and and 2 hours after supper-- reports ranges for both values consistently between "99- 110;" with fasting blood sugar this morning of "104;" encouraged patient to take all recorded blood sugars and medications with her to PCP office visits for review and she verbalizes agreement ---- reviewed most recently recorded A1-C value along with trends over last year with patient (5.8- 03/11/19): patient verbalizes good understanding of same ---- confirmed that she obtained and reviewed previously provided educational material around self-health management of DM and COPD: endorses that she has been following appropriate low-salt/ low carb diet and denies questions around same ---- has not heard back from previous care coordination outreach placed to her PCP on 06/29/19 around patient's request for shower chair-- patient reports she will inquire to follow up when she calls PCP to make appointment  Patient denies further issues, concerns, or problems today. Iconfirmed that patient hasmy direct phone number, the main [facility] CM office phone number, and the [facility] CM 24-hour nurse advice phone number should issues arise prior to next scheduled [place] outreachby phone next month.  Discussed  with patient that she has made very good progress toward meeting her previously established goals and possibility of transfer to [place] at time of next outreach, and patient is agreeable to this.  Encouraged patient to contact me directly if needs, questions, issues, or concerns arise prior to next scheduled outreach; patient agreed to do so.  Plan:  Patient will take medications as prescribed and will attend all scheduled provider appointments  Patient will scheduleannualoffice visit withpulmonary care provider for medication review and updated A1-C  Patient will promptly notify care providers for any new concerns/ issues/ problems that arise  Patient will continueestablished daily routines for self-health monitoring including blood sugar monitoring and exercise program  Patient will continue to follow established action plan for COPD  [facility] Community CM outreach to continue with scheduled phone call nextmonth, possibly for transfer to [place], unless indicated sooner  [facility] CM Care Plan Problem One     Most Recent Value  Care Plan Problem One  Self- health management of chronic disease states of COPD and DM as evidenced by patient reporting [Goal modified and re-stated today]  Role Documenting the Problem One  Care Management Coordinator  Care Plan for Problem One  Active  [facility] Long Term Goal   Over the next 60 days, patient will attend all scheduled provider appointments, as evidenced by patient reporting and collaboration with care providers as indicated during [place] RN CM outreach  [facility] Long Term Goal Start Date  05/30/19  [facility] Long Term Goal Met Date  07/31/19 [Goal met]  Interventions for Problem One Long Term Goal  Confirmed that patient has not missed any provider appointmnets,  encouraged patient to schedule next PCP appointment for overall medication review and A1-C  [facility] CM Short Term Goal #1   Over the next 30 days, patient will continue to  monitor and record her daily fasting blood sugars and will schedule PCP office visit to obtain updated A1-C, as evidenced by patient reporting and review of EMR/ collaboration with PCP team as indicated during [facility] RN CM outreach  [facility] CM Short Term Goal #1 Start Date  07/31/19  Interventions for Short Term Goal #1  Discussed with patient her recent fasting blood sugars at home and confirmed that she continues to monitor/ record daily,  confirmed that patient has accurate understanding of last recorded A1-C and encouraged patient to schedule prompt PCP appointment for next A1-C,  encouraged patient to discuss her medicatins for DM with PCP at time of next scheduled visit,  encouraged patient to always take blood sugars recorded at home with her to PCP appointments  [facility] CM Short Term Goal #2   Over the next 30 days patient will follow established action plan for COPD/ shortness of breath, as evidenced by patient reporting during [facility] RN CM outreach  [facility] CM Short Term Goal #2 Start Date  07/31/19  Interventions for Short Term Goal #2  Confirmed that patient has not had any recent COPD flares,  confirmed that she is able to verbalize appropriate action plan for episodes shortness of breath/ activity intolerance,  discussed use of maintenance and rescue inhalers and confirmed that patient has no current clinical concerns around breathing status,  encouraged patient to always take medications with her to provider appointments,  positive reinforcement provided to patient for continuing to participate in her established exercise/ activity routines     Oneta Rack, RN, Copywriter, advertising, [place] Coordinator [facility] Care Management  [phone])  [phone]   

## 2019-08-07 DIAGNOSIS — E119 Type 2 diabetes mellitus without complications: Secondary | ICD-10-CM | POA: Diagnosis not present

## 2019-08-07 DIAGNOSIS — Z20822 Contact with and (suspected) exposure to covid-19: Secondary | ICD-10-CM | POA: Diagnosis not present

## 2019-08-07 DIAGNOSIS — J449 Chronic obstructive pulmonary disease, unspecified: Secondary | ICD-10-CM | POA: Diagnosis not present

## 2019-08-07 DIAGNOSIS — J40 Bronchitis, not specified as acute or chronic: Secondary | ICD-10-CM | POA: Diagnosis not present

## 2019-08-07 DIAGNOSIS — R350 Frequency of micturition: Secondary | ICD-10-CM | POA: Diagnosis not present

## 2019-08-07 DIAGNOSIS — Z87891 Personal history of nicotine dependence: Secondary | ICD-10-CM | POA: Diagnosis not present

## 2019-08-07 DIAGNOSIS — I471 Supraventricular tachycardia: Secondary | ICD-10-CM | POA: Diagnosis not present

## 2019-08-11 ENCOUNTER — Ambulatory Visit: Payer: Medicare HMO | Attending: Internal Medicine

## 2019-08-11 DIAGNOSIS — Z23 Encounter for immunization: Secondary | ICD-10-CM

## 2019-08-11 NOTE — Progress Notes (Signed)
   Covid-19 Vaccination Clinic  Name:  [name]    MRN: [id number] DOB: 1945-08-29  08/11/2019  [name] was observed post Covid-19 immunization for 15 minutes without incident. She was provided with Vaccine Information Sheet and instruction to access the V-Safe system.   [name] was instructed to call 911 with any severe reactions post vaccine: Marland Kitchen Difficulty breathing  . Swelling of face and throat  . A fast heartbeat  . A bad rash all over body  . Dizziness and weakness   Immunizations Administered    Name Date Dose VIS Date Route   Pfizer COVID-19 Vaccine 08/11/2019 11:56 AM 0.3 mL 04/28/2019 Intramuscular   Manufacturer: [employer], [employer]   Lot: [id number]   NDC: [address]-9

## 2019-08-29 ENCOUNTER — Encounter: Payer: Self-pay | Admitting: *Deleted

## 2019-08-29 ENCOUNTER — Other Ambulatory Visit: Payer: Self-pay | Admitting: *Deleted

## 2019-08-29 NOTE — Patient Outreach (Signed)
[facility] Customer service manager [facility]) Care Management [facility] Community CM Telephone Outreach Unsuccessful (consecutive) outreach attempt # 1- previously engaged patient  08/29/2019  [name] [birth year] [id number]  Unsuccessful telephoneoutreachto [name], 74 y/o female referred to [facility] RN CM by [facility] for multiple hospital ED/ visits; patient had ED visit 11/07/2020at which time patient had psychiatric evaluation/ admission and was eventually discharged home to self-care.Patient has history including, but not limited to, PTSD with anxiety and depression; DDD with fibromyalgia/ chronic pain; COPD; DM- type II; and HLD.  Phone rang multiple times without physical or voice mail pick up; unable to leave patient voice message requesting call back  Plan:  Will re-attempt [facility] Community CM telephone outreach within 4 business days if I do not hear back from patient first  Caryl Pina, RN, BSN, [employer] Coordinator [facility] Care Management  [phone]

## 2019-09-01 ENCOUNTER — Encounter: Payer: Self-pay | Admitting: *Deleted

## 2019-09-01 ENCOUNTER — Other Ambulatory Visit: Payer: Self-pay | Admitting: *Deleted

## 2019-09-01 NOTE — Patient Outreach (Signed)
[facility]) Care Management [employer] Telephone Outreach- transfer to [place]  09/01/2019  STEFANNY PIERI 1945/07/16 [id number]  Successful telephoneoutreachto [name], 74 y/o female referred to [place] by [facility] for multiple hospital ED/ visits; patient had ED visit 11/07/2020at which time patient had psychiatric evaluation/ admission and was eventually discharged home to self-care.Patient has history including, but not limited to, PTSD with anxiety and depression; DDD with fibromyalgia/ chronic pain; COPD; DM- type II (well-controlled; last A1-C 5.23 February 2019) ; and HLD.  HIPAA/ identity verifiedand patient again reports "doing great." She denies pain outside of her baseline and denies new/ recent falls; reports she continues walking for exercise regularly when weather allows.  Discussed current clinical condition with patient and recent episode of bronchitis; confirmed that patient has no current clinical concerns and that she took antibiotics/ prednisone taper dose as prescribed at time of bronchitis diagnosis (08/07/19); reviewed with patient established plan of action for COPD flare- positive reinforcement provided to patient for following her established action plan and contacting her PCP promptly.    Patient further reports:  -- obtained second dose corona virus vaccine -- accurate understanding of upcoming provider appointments; continues to use established resources for transportation; discussed need for patient to obtain updated A1-C level at time of next PCP office visit- verbalizes understanding/ agreement; states she discussed her blood sugar readings and ranges with PCP at time of office visit 08/07/19; states no changes made at that time due to acute bronchitis flare  -- continues monitoring and recording daily fasting blood sugars: reports consistent ranges between 100-110; states her blood sugars were briefly increased with recently  prescribed prednisone- this has now resolved -- no changes to medications; has not needed to use rescue inhaler recently -- personal care services continue daily; caregiver "Caryl Asp" continues to assist with care needs/ household management/ transportation  -- continues to receive Meals on Wheels   Patient denies further issues, concerns, or problems today. Again discussed with patient that she has made very good progress toward meeting her previously established goals and possibility of transfer to [place] today, and patient is agreeable to this.  Encouraged patient to engage fully with [place], she verbalizes agreement.  Iconfirmed that patient hasmy direct phone number, the main [place] office phone number, and the [facility] CM 24-hour nurse advice phone number should issues arise prior to [place] outreachby phone next month.   Encouraged patient to contact me directly if needs, questions, issues, or concerns arise prior to [place] outreach; patient agreed to do so.  Plan:  Patient will take medications as prescribed and will attend all scheduled provider appointments  Patient will promptly notify care providers for any new concerns/ issues/ problems that arise  Patient will continueestablished daily routines for self-health monitoring including blood sugar monitoring and exercise program  Patient will continue to follow established action plan for COPD  Will close [facility] RN CM case, as patient has met her previously established [facility] CM goals and denies ongoing care coordination/ care management needs, and will send patient's PCP today's note/ care plan as quarterly summary  Will place referral to [place] for ongoing support of self-health management of COPD and make patient's PCP aware of discipline transfer  [facility] CM Care Plan Problem One     Most Recent Value  Care Plan Problem One  Self- health management of chronic disease  states of COPD as evidenced by patient reporting [Goal modified and re-stated today]  Role Documenting the Problem One  Care Management Coordinator  Care Plan for Problem One  Active  [facility] Long Term Goal   Over the next 45 days, patient will continue following established action plan for COPD exacerbation, as evidenced by patient reporting during [place] outreach  [facility] Long Term Goal Start Date  09/01/19  Interventions for Problem One Long Term Goal  Reviewed with patient her current clinical condition and recent episode of bronchitis,  confirmed that patient has no current clinical concerns and followed established action plan for COPD,  confirmed that she completed her doses of prescribed antibiotics and prednisone,  reviewed with patient her established action plan for signs/ symptoms yellow COPD zone and discussed transfer to [place],  placed referral with [place] for ongoing support of self-health management of chronic disease state of COPD and encouraged patient to actively engage with [place] Coach  [facility] CM Short Term Goal #1   Over the next 30 days, patient will continue to monitor and record her daily fasting blood sugars and will schedule PCP office visit to obtain updated A1-C, as evidenced by patient reporting and review of EMR/ collaboration with PCP team as indicated during [facility] RN CM outreach  [facility] CM Short Term Goal #1 Start Date  07/31/19  [facility] CM Short Term Goal #1 Met Date  09/01/19 [Goal met]  Interventions for Short Term Goal #1  Confirmed that patient attended recent PCP office visit and discussed her current plan of care around self-health management of DM with provider,  confirmed that she is able to verbalize her most recent A1-C and understands significance of A1-C value and need to have A1-C regularly checked  [facility] CM Short Term Goal #2   Over the next 30 days patient will follow established action plan for COPD/ shortness of breath, as  evidenced by patient reporting during [facility] RN CM outreach  [facility] CM Short Term Goal #2 Start Date  07/31/19  [facility] CM Short Term Goal #2 Met Date  09/01/19 [Goal met]  Interventions for Short Term Goal #2  Confirmed that patient understands established action plan for COPD exacerbation,  reviewed current COPD medications and confirmed that she has not needed to use her rescue inhaler recently,  placed [place] referral for ongoing support of chronic disease state of COPD     It has been a pleasure caring for Darden Dates, RN, BSN, [employer] Coordinator [facility] Care Management  [phone]

## 2019-09-05 ENCOUNTER — Other Ambulatory Visit: Payer: Self-pay | Admitting: *Deleted

## 2019-09-05 DIAGNOSIS — Z0100 Encounter for examination of eyes and vision without abnormal findings: Secondary | ICD-10-CM | POA: Diagnosis not present

## 2019-09-20 ENCOUNTER — Encounter: Payer: Self-pay | Admitting: *Deleted

## 2019-09-20 ENCOUNTER — Other Ambulatory Visit: Payer: Self-pay | Admitting: *Deleted

## 2019-09-20 NOTE — Patient Outreach (Signed)
[facility]) Care Management  [facility] Care Manager  09/20/2019   [name] 01-Oct-1945 [id number]   RN Health Coach Initial Assessment  Referral Date:  09/01/2019 Referral Source:  Transfer from [facility] RN Care Coordinator Reason for Referral:  Continued Disease Management Educations Insurance:  Medicare   Outreach Attempt:  Successful telephone outreach to patient for introduction and initial telephone assessment.  HIPAA verified with patient.  RN Health Coach introduced self and role.  Patient agrees to Disease Management outreaches.  Patient completed initial telephone assessment.  Social:  Patient lives at home alone.  States she resides in [facility] housing and has 2 case workers that assist her.  Patient reporting the case workers visit about once a week and one of them transports her to her medical appointments. Patient also reports she receives home health aide 7 days a week, 2 1/2 hours a day.  Ambulates independently and denies any falls within the last year.  Reports aide assist with ADLs of bathing and IADLs of cooking and cleaning.  DME in the home include:  Straight cane, rolling walker, upper dentures, eyeglasses, bedside commode, CBG meter, grab bars around toilet and in shower, and scale.  Conditions:   Per chart review and discussion with patient, PMH include but not limited to:  Posttraumatic stress disorder, spinal stenosis, hyperlipidemia, bilateral carotid artery stenosis, COPD, type 2 Diabetes, paroxysmal supraventricular tachycardia, hypothyroidism, benign esophageal stricture, skin cancer, congestive heart failure, chronic back pain, and graves disease.  Patient denies any recent emergency room visits or hospitalizations.  Does report sitting out on porch recently and pollen now making her allergies act up, leading to some shortness of breath.  States she is waiting on her rescue inhaler to be delivered from her pharmacy and she does not have any doses left in her  inhaler she has on hand.  Does report compliance with her maintenance inhalers.  Encouraged patient to contact provider and request a sample Albuterol inhaler, as she reports she should receive the mailed inhaler on Friday, per Pharmacy.  Patient reports monitoring her blood sugars twice a day.  Fasting blood sugar this morning was 106 with fasting ranges in 100's.  Last Hgb A1C was 5.8 on 03/11/2019.  Does report chronic pain managed with tylenol.  Medications:  Patient reports taking about 15 medications daily.  States she manages her medications herself without difficulties and denies any trouble affording her medications at this time.  Does receive her medications in the mail from [place] mail order.  Encounter Medications:  Outpatient Encounter Medications as of 09/20/2019  Medication Sig Note  . acetaminophen (TYLENOL) 500 MG tablet Take by mouth as needed.   Marland Kitchen albuterol (PROVENTIL HFA;VENTOLIN HFA) 108 (90 Base) MCG/ACT inhaler Inhale 2 puffs into the lungs every 6 (six) hours as needed for wheezing or shortness of breath. 05/30/2019: Patient reports uses "rarely" reports used last "a couple of days ago"  . alendronate (FOSAMAX) 70 MG tablet Take 70 mg by mouth once a week.   Marland Kitchen aspirin 81 MG tablet Take 81 mg by mouth daily.   . Azelastine HCl 137 MCG/SPRAY SOLN daily.   . calcium-vitamin D (CALCIUM 500/D) 500-200 MG-UNIT tablet Take 1 tablet by mouth daily.   . fluticasone (FLONASE) 50 MCG/ACT nasal spray 2 sprays daily.   Marland Kitchen guaifenesin (HUMIBID E) 400 MG TABS tablet Take by mouth as needed.   . hydrocortisone (ANUSOL-HC) 2.5 % rectal cream Place 1 application rectally 3 (three) times daily as needed  for hemorrhoids or itching. Reported on 12/02/2015   . LORazepam (ATIVAN) 0.5 MG tablet Take 0.5 mg by mouth at bedtime.   Marland Kitchen lubiprostone (AMITIZA) 8 MCG capsule Take 8 mcg by mouth 2 (two) times daily with a meal.   . metFORMIN (GLUCOPHAGE) 500 MG tablet Take 500 mg by mouth daily with breakfast.     . ondansetron (ZOFRAN ODT) 4 MG disintegrating tablet Take 1 tablet (4 mg total) by mouth every 8 (eight) hours as needed for nausea or vomiting.   . Potassium 99 MG TABS Take 1 tablet by mouth daily. 05/30/2019: Patient reports uses prn  . simvastatin (ZOCOR) 20 MG tablet Take 20 mg by mouth daily at 6 PM.   . SYMBICORT 160-4.5 MCG/ACT inhaler Inhale 2 puffs into the lungs 2 (two) times daily.   Marland Kitchen tiotropium (SPIRIVA) 18 MCG inhalation capsule Place 18 mcg into inhaler and inhale daily. Reported on 08/07/2015   . busPIRone (BUSPAR) 7.5 MG tablet Take 7.5 mg by mouth 2 (two) times daily. 09/20/2019: No longer taking  . FLUoxetine (PROZAC) 20 MG capsule Take 1 capsule (20 mg total) by mouth daily. 09/20/2019: Reports taking 40 mg daily  . levothyroxine (SYNTHROID) 112 MCG tablet Take 1 tablet (112 mcg total) by mouth daily before breakfast. 09/20/2019: Reports taking 88 mcg daily  . Melatonin 3 MG TABS Take 3 mg by mouth at bedtime. 09/20/2019: Reports not taking   No facility-administered encounter medications on file as of 09/20/2019.    Functional Status:  In your present state of health, do you have any difficulty performing the following activities: 09/20/2019 05/30/2019  Hearing? N N  Vision? N N  Difficulty concentrating or making decisions? N N  Walking or climbing stairs? Y N  Comment short of breath with distances or climbing stairs -  Dressing or bathing? Malvin Johns  Comment aide assist with bathing Requires occasional/ minimal assistance  Doing errands, shopping? Malvin Johns  Comment case workers assist with grocery shopping uses established Actuary and eating ? Y N  Comment aide assist with cooking -  Using the Toilet? N N  In the past six months, have you accidently leaked urine? N N  Do you have problems with loss of bowel control? N N  Managing your Medications? N N  Managing your Finances? N N  Housekeeping or managing your Housekeeping? Y N  Comment aide does  cleaning medicaid CNA assists as indicated  Some recent data might be hidden    Fall/Depression Screening: Fall Risk  09/20/2019 05/30/2019 02/01/2018  Falls in the past year? 0 0 No  Comment - - -  Number falls in past yr: 0 0 -  Comment - No falls reported over last 3 years -  Injury with Fall? 0 0 -  Comment - N/A- no falls reported -  Risk for fall due to : Medication side effect;Impaired balance/gait;Impaired mobility Impaired balance/gait;Medication side effect History of fall(s);Impaired balance/gait  Follow up Falls evaluation completed;Education provided;Falls prevention discussed Falls prevention discussed -   PHQ 2/9 Scores 09/20/2019 05/30/2019 02/01/2018 01/07/2016 12/02/2015 11/06/2015 10/01/2015  PHQ - 2 Score 0 0 1 0 0 0 1  PHQ- 9 Score - - 6 - - - -  Exception Documentation - - - - - - -   [facility] CM Care Plan Problem One     Most Recent Value  Care Plan Problem One  Knowledge deficiet related to self care management of COPD.  Role Documenting  the Problem One  [place] for Problem One  Active  [facility] Long Term Goal   Patient will report no emergency room visits within the next 90 days.  [facility] Long Term Goal Start Date  09/20/19  Interventions for Problem One Long Term Goal  Care plan and goals reviewed and discussed with patient, reviewed medications and indications and encouraged medication compliance, encouraged patient to contact provider for albuterol inhaler sample until her prescription arrives in the mail, discussed rescue inhaler versus maintainence inhaler, sending Living Well with COPD educational packet, discussed COPD action plan and introduced COPD zones, encouraged patient to review educational material when she recieved, encouraged to continue with monitoring blood sugars, encouraged diabetic diet, encouraged to keep and attend scheduled medical appointments     Appointments:  Last attended appointment with primary care provider, Grayland Ormond PA-C on  08/07/2019 and will have follow up in 6 months.  States she follows up with Pulmonologist yearly, last seen 11/03/2018.  Advanced Directives:  Denies having Advance Directive in place but states she has the paperwork to complete one.   Consent:  [facility] services reviewed and discussed.  Patient verbally agrees to Disease Management outreaches.  Plan: RN Health Coach will send primary MD barriers letter. RN Health Coach will route initial telephone assessment note to primary MD. [place] will send patient Living Well with COPD educational packet. RN Health Coach will make next telephone outreach to patient in the month of August and patient agrees to future outreach.  [place] [phone] [email or web address]

## 2019-10-04 DIAGNOSIS — R69 Illness, unspecified: Secondary | ICD-10-CM | POA: Diagnosis not present

## 2019-10-17 DIAGNOSIS — R5383 Other fatigue: Secondary | ICD-10-CM | POA: Diagnosis not present

## 2019-10-17 DIAGNOSIS — R0789 Other chest pain: Secondary | ICD-10-CM | POA: Diagnosis not present

## 2019-10-17 DIAGNOSIS — M810 Age-related osteoporosis without current pathological fracture: Secondary | ICD-10-CM | POA: Diagnosis not present

## 2019-10-17 DIAGNOSIS — E119 Type 2 diabetes mellitus without complications: Secondary | ICD-10-CM | POA: Diagnosis not present

## 2019-10-17 DIAGNOSIS — E039 Hypothyroidism, unspecified: Secondary | ICD-10-CM | POA: Diagnosis not present

## 2019-10-17 DIAGNOSIS — E559 Vitamin D deficiency, unspecified: Secondary | ICD-10-CM | POA: Diagnosis not present

## 2019-10-17 DIAGNOSIS — E538 Deficiency of other specified B group vitamins: Secondary | ICD-10-CM | POA: Diagnosis not present

## 2019-10-17 DIAGNOSIS — R634 Abnormal weight loss: Secondary | ICD-10-CM | POA: Diagnosis not present

## 2019-10-17 DIAGNOSIS — J449 Chronic obstructive pulmonary disease, unspecified: Secondary | ICD-10-CM | POA: Diagnosis not present

## 2019-10-23 DIAGNOSIS — F331 Major depressive disorder, recurrent, moderate: Secondary | ICD-10-CM | POA: Diagnosis not present

## 2019-11-23 ENCOUNTER — Other Ambulatory Visit: Payer: Self-pay | Admitting: *Deleted

## 2019-11-23 NOTE — Patient Outreach (Signed)
[facility]) Care Management  11/23/2019  ZOE CREASMAN Jul 10, 1945 [id number]   RN Health Coach Transfer to [facility] Team  Referral Date:  09/01/2019 Referral Source:  Transfer from [facility] RN Care Coordinator Reason for Referral:  Continued Disease Management Educations Insurance:  [employer] Medicare   Outreach Attempt:  Successful telephone outreach to patient to verify insurance change.  HIPAA verified with patient.  Patient confirms she has changed insurance to [facility].  RN Health Coach discussed with patient [facility] Care Coordination Team and need for transfer over to this team for continued Disease Management.  Patient stated her understanding and verbally agreed to continued Disease Management outreaches.  Plan:  RN Health Coach will refer patient to [facility] Care Coordinator Team.  Rhae Lerner RN [facility] Care Management  RN Health Coach [phone] [email or web address]

## 2019-12-13 ENCOUNTER — Other Ambulatory Visit: Payer: Self-pay

## 2019-12-13 NOTE — Patient Outreach (Signed)
[facility]) Care Management  [facility] Care Manager  12/13/2019   JOAN HERSCHBERGER 14-Mar-1946 [id number]  Subjective: Telephone call to patient for follow up disease management. Patient states she is doing good.  Just trying to get her life alert system switched to [employer].  Assisted with getting her connected with [employer].  Patient reports her breathing is doing good.  Discussed COPD and management.  She verbalized understanding and voices no further concerns.     Objective:   Encounter Medications:  Outpatient Encounter Medications as of 12/13/2019  Medication Sig Note  . acetaminophen (TYLENOL) 500 MG tablet Take by mouth as needed.   Marland Kitchen albuterol (PROVENTIL HFA;VENTOLIN HFA) 108 (90 Base) MCG/ACT inhaler Inhale 2 puffs into the lungs every 6 (six) hours as needed for wheezing or shortness of breath. 05/30/2019: Patient reports uses "rarely" reports used last "a couple of days ago"  . alendronate (FOSAMAX) 70 MG tablet Take 70 mg by mouth once a week.   Marland Kitchen aspirin 81 MG tablet Take 81 mg by mouth daily.   . Azelastine HCl 137 MCG/SPRAY SOLN daily.   . calcium-vitamin D (CALCIUM 500/D) 500-200 MG-UNIT tablet Take 1 tablet by mouth daily.   . fluticasone (FLONASE) 50 MCG/ACT nasal spray 2 sprays daily.   Marland Kitchen guaifenesin (HUMIBID E) 400 MG TABS tablet Take by mouth as needed.   . hydrocortisone (ANUSOL-HC) 2.5 % rectal cream Place 1 application rectally 3 (three) times daily as needed for hemorrhoids or itching. Reported on 12/02/2015   . LORazepam (ATIVAN) 0.5 MG tablet Take 0.5 mg by mouth at bedtime.   Marland Kitchen lubiprostone (AMITIZA) 8 MCG capsule Take 8 mcg by mouth 2 (two) times daily with a meal.   . Melatonin 3 MG TABS Take 3 mg by mouth at bedtime. 09/20/2019: Reports not taking  . metFORMIN (GLUCOPHAGE) 500 MG tablet Take 500 mg by mouth daily with breakfast.    . ondansetron (ZOFRAN ODT) 4 MG disintegrating tablet Take 1 tablet (4 mg total) by mouth every 8 (eight) hours as  needed for nausea or vomiting.   . Potassium 99 MG TABS Take 1 tablet by mouth daily. 05/30/2019: Patient reports uses prn  . simvastatin (ZOCOR) 20 MG tablet Take 20 mg by mouth daily at 6 PM.   . SYMBICORT 160-4.5 MCG/ACT inhaler Inhale 2 puffs into the lungs 2 (two) times daily.   Marland Kitchen tiotropium (SPIRIVA) 18 MCG inhalation capsule Place 18 mcg into inhaler and inhale daily. Reported on 08/07/2015   . busPIRone (BUSPAR) 7.5 MG tablet Take 7.5 mg by mouth 2 (two) times daily. (Patient not taking: Reported on 12/13/2019) 09/20/2019: No longer taking  . FLUoxetine (PROZAC) 20 MG capsule Take 1 capsule (20 mg total) by mouth daily. 09/20/2019: Reports taking 40 mg daily  . levothyroxine (SYNTHROID) 112 MCG tablet Take 1 tablet (112 mcg total) by mouth daily before breakfast. 09/20/2019: Reports taking 88 mcg daily   No facility-administered encounter medications on file as of 12/13/2019.    Functional Status:  In your present state of health, do you have any difficulty performing the following activities: 12/13/2019 09/20/2019  Hearing? N N  Vision? N N  Difficulty concentrating or making decisions? N N  Walking or climbing stairs? Y Y  Comment shortness of breath short of breath with distances or climbing stairs  Dressing or bathing? Y Y  Comment shortness of breath aide assist with bathing  Doing errands, shopping? Y Y  Comment shortness of breath case workers  assist with grocery shopping  Preparing Food and eating ? N Y  Comment - aide assist with cooking  Using the Toilet? N N  In the past six months, have you accidently leaked urine? N N  Do you have problems with loss of bowel control? N N  Managing your Medications? N N  Managing your Finances? N N  Housekeeping or managing your Housekeeping? N Y  Comment - aide does cleaning  Some recent data might be hidden    Fall/Depression Screening: Fall Risk  12/13/2019 09/20/2019 05/30/2019  Falls in the past year? 0 0 0  Comment - - -  Number falls  in past yr: - 0 0  Comment - - No falls reported over last 3 years  Injury with Fall? - 0 0  Comment - - N/A- no falls reported  Risk for fall due to : - Medication side effect;Impaired balance/gait;Impaired mobility Impaired balance/gait;Medication side effect  Follow up - Falls evaluation completed;Education provided;Falls prevention discussed Falls prevention discussed   PHQ 2/9 Scores 12/13/2019 09/20/2019 05/30/2019 02/01/2018 01/07/2016 12/02/2015 11/06/2015  PHQ - 2 Score 0 0 0 1 0 0 0  PHQ- 9 Score - - - 6 - - -  Exception Documentation - - - - - - -    Assessment: Patient managing chronic conditions and benefits from disease management support.    Plan:  [facility] CM Care Plan Problem One     Most Recent Value  Care Plan Problem One Knowledge deficit related to self care management of COPD.  Role Documenting the Problem One Care Management Telephonic Coordinator  Care Plan for Problem One Active  [facility] Long Term Goal  Over the next 90 days, patient will demonstrate and/or verbalize understanding of self-health management for long term care of COPD.  [facility] Long Term Goal Start Date 12/13/19  Interventions for Problem One Long Term Goal RN CM reviewed with patient ways of monitoring COPD, signs & symptoms, and when to notify physician.     RN CM will contact patient in the month of October and patient agreeable.   Bary Leriche, RN, MSN [facility] Care Management Care Management Coordinator Direct Line [phone] Cell [phone] Toll Free: [phone]  Fax: [phone]

## 2019-12-19 ENCOUNTER — Ambulatory Visit: Payer: Self-pay | Admitting: *Deleted

## 2020-01-09 DIAGNOSIS — E039 Hypothyroidism, unspecified: Secondary | ICD-10-CM | POA: Diagnosis not present

## 2020-01-09 DIAGNOSIS — R0602 Shortness of breath: Secondary | ICD-10-CM | POA: Diagnosis not present

## 2020-01-09 DIAGNOSIS — I6523 Occlusion and stenosis of bilateral carotid arteries: Secondary | ICD-10-CM | POA: Diagnosis not present

## 2020-01-09 DIAGNOSIS — F4310 Post-traumatic stress disorder, unspecified: Secondary | ICD-10-CM | POA: Diagnosis not present

## 2020-01-09 DIAGNOSIS — E782 Mixed hyperlipidemia: Secondary | ICD-10-CM | POA: Diagnosis not present

## 2020-01-09 DIAGNOSIS — J449 Chronic obstructive pulmonary disease, unspecified: Secondary | ICD-10-CM | POA: Diagnosis not present

## 2020-01-09 DIAGNOSIS — M810 Age-related osteoporosis without current pathological fracture: Secondary | ICD-10-CM | POA: Diagnosis not present

## 2020-01-09 DIAGNOSIS — E119 Type 2 diabetes mellitus without complications: Secondary | ICD-10-CM | POA: Diagnosis not present

## 2020-01-11 DIAGNOSIS — E875 Hyperkalemia: Secondary | ICD-10-CM | POA: Diagnosis not present

## 2020-02-02 ENCOUNTER — Encounter: Payer: Self-pay | Admitting: Internal Medicine

## 2020-02-02 ENCOUNTER — Ambulatory Visit (INDEPENDENT_AMBULATORY_CARE_PROVIDER_SITE_OTHER): Payer: Medicare HMO | Admitting: Internal Medicine

## 2020-02-02 ENCOUNTER — Other Ambulatory Visit: Payer: Self-pay

## 2020-02-02 VITALS — BP 150/86 | HR 72 | Ht 66.0 in | Wt 112.2 lb

## 2020-02-02 DIAGNOSIS — E063 Autoimmune thyroiditis: Secondary | ICD-10-CM

## 2020-02-02 DIAGNOSIS — E785 Hyperlipidemia, unspecified: Secondary | ICD-10-CM | POA: Diagnosis not present

## 2020-02-02 DIAGNOSIS — E1169 Type 2 diabetes mellitus with other specified complication: Secondary | ICD-10-CM | POA: Diagnosis not present

## 2020-02-02 DIAGNOSIS — I517 Cardiomegaly: Secondary | ICD-10-CM

## 2020-02-02 DIAGNOSIS — J40 Bronchitis, not specified as acute or chronic: Secondary | ICD-10-CM

## 2020-02-02 DIAGNOSIS — M533 Sacrococcygeal disorders, not elsewhere classified: Secondary | ICD-10-CM

## 2020-02-02 DIAGNOSIS — E038 Other specified hypothyroidism: Secondary | ICD-10-CM

## 2020-02-02 DIAGNOSIS — R0602 Shortness of breath: Secondary | ICD-10-CM

## 2020-02-02 MED ORDER — LOSARTAN POTASSIUM-HCTZ 50-12.5 MG PO TABS: 1.0000 | ORAL_TABLET | Freq: Every day | ORAL | 3 refills | Status: DC

## 2020-02-02 NOTE — Addendum Note (Signed)
Addended by: Jobie Quaker on: 02/02/2020 04:03 PM   Modules accepted: Orders

## 2020-02-02 NOTE — Assessment & Plan Note (Signed)
Last TSH level was normal.

## 2020-02-02 NOTE — Assessment & Plan Note (Signed)
Electrocardiogram does not show any acute changes there are some changes suggestive of right ventricular hypertrophy.

## 2020-02-02 NOTE — Progress Notes (Signed)
New Patient Office Visit  Subjective:  Patient ID: [name], female    DOB: 1945/06/22  Age: 74 y.o. MRN: [id number]  CC:  Chief Complaint  Patient presents with  . New Patient (Initial Visit)    Shortness of Breath This is a chronic problem. The current episode started more than 1 year ago. The problem occurs constantly. The problem has been gradually worsening. Associated symptoms include orthopnea and PND. Pertinent negatives include no abdominal pain, chest pain, claudication, ear pain, fever, headaches, hemoptysis, leg pain, leg swelling, neck pain, rash, sore throat, sputum production, swollen glands, syncope, vomiting or wheezing. The symptoms are aggravated by exercise and lying flat. She has tried beta agonist inhalers and steroid inhalers for the symptoms. The treatment provided mild relief. Her past medical history is significant for allergies, chronic lung disease, COPD and a heart failure. There is no history of DVT, pneumonia or a recent surgery.   Patient presents for sob  Past Medical History:  Diagnosis Date  . Anxiety   . Chest pain   . CHF (congestive heart failure) (HCC)   . Depression   . Diabetes mellitus without complication (HCC)   . Fibromyalgia   . Graves disease   . Hypertension   . IBS (irritable bowel syndrome)   . PTSD (post-traumatic stress disorder)   . Sinus problem   . Spinal stenosis      Current Outpatient Medications:  .  acetaminophen (TYLENOL) 500 MG tablet, Take by mouth as needed., Disp: , Rfl:  .  albuterol (PROVENTIL HFA;VENTOLIN HFA) 108 (90 Base) MCG/ACT inhaler, Inhale 2 puffs into the lungs every 6 (six) hours as needed for wheezing or shortness of breath., Disp: 1 Inhaler, Rfl: 2 .  alendronate (FOSAMAX) 70 MG tablet, Take 70 mg by mouth once a week., Disp: , Rfl:  .  aspirin 81 MG tablet, Take 81 mg by mouth daily., Disp: , Rfl:  .  Azelastine HCl 137 MCG/SPRAY SOLN, daily., Disp: , Rfl:  .  fluticasone (FLONASE) 50  MCG/ACT nasal spray, 2 sprays daily., Disp: , Rfl:  .  guaifenesin (HUMIBID E) 400 MG TABS tablet, Take by mouth as needed., Disp: , Rfl:  .  hydrocortisone (ANUSOL-HC) 2.5 % rectal cream, Place 1 application rectally 3 (three) times daily as needed for hemorrhoids or itching. Reported on 12/02/2015, Disp: , Rfl:  .  LORazepam (ATIVAN) 0.5 MG tablet, Take 0.5 mg by mouth at bedtime., Disp: , Rfl:  .  lubiprostone (AMITIZA) 8 MCG capsule, Take 8 mcg by mouth 2 (two) times daily with a meal., Disp: , Rfl:  .  metFORMIN (GLUCOPHAGE) 500 MG tablet, Take 500 mg by mouth daily with breakfast. , Disp: , Rfl:  .  ondansetron (ZOFRAN ODT) 4 MG disintegrating tablet, Take 1 tablet (4 mg total) by mouth every 8 (eight) hours as needed for nausea or vomiting., Disp: 20 tablet, Rfl: 0 .  pantoprazole (PROTONIX) 40 MG tablet, Take 40 mg by mouth daily., Disp: , Rfl:  .  simvastatin (ZOCOR) 20 MG tablet, Take 20 mg by mouth daily at 6 PM., Disp: , Rfl:  .  SYMBICORT 160-4.5 MCG/ACT inhaler, Inhale 2 puffs into the lungs 2 (two) times daily., Disp: , Rfl:  .  tiotropium (SPIRIVA) 18 MCG inhalation capsule, Place 18 mcg into inhaler and inhale daily. Reported on 08/07/2015, Disp: , Rfl:  .  FLUoxetine (PROZAC) 20 MG capsule, Take 1 capsule (20 mg total) by mouth daily., Disp: 30 capsule, Rfl:  0 .  levothyroxine (SYNTHROID) 112 MCG tablet, Take 1 tablet (112 mcg total) by mouth daily before breakfast., Disp: 30 tablet, Rfl: 0 .  losartan-hydrochlorothiazide (HYZAAR) 50-12.5 MG tablet, Take 1 tablet by mouth daily., Disp: 90 tablet, Rfl: 3   Past Surgical History:  Procedure Laterality Date  . ABDOMINAL HYSTERECTOMY    . ESOPHAGOGASTRODUODENOSCOPY (EGD) WITH PROPOFOL N/A 01/10/2015   Procedure: ESOPHAGOGASTRODUODENOSCOPY (EGD) WITH PROPOFOL;  Surgeon: Wallace Cullens, MD;  Location: [facility] ENDOSCOPY;  Service: Gastroenterology;  Laterality: N/A;  . RECTAL SURGERY    . SAVORY DILATION N/A 01/10/2015   Procedure: SAVORY  DILATION;  Surgeon: Wallace Cullens, MD;  Location[facility] ENDOSCOPY;  Service: Gastroenterology;  Laterality: N/A;  . toenail removal Bilateral    great toes  . TONSILLECTOMY AND ADENOIDECTOMY      Family History  Problem Relation Age of Onset  . Alcohol abuse Mother   . Depression Mother   . Varicose Veins Mother   . Alcohol abuse Father   . Depression Father   . Alcohol abuse Sister   . Asthma Sister   . COPD Sister   . Depression Sister   . Heart disease Sister   . Hypertension Sister   . Depression Maternal Grandmother   . Diabetes Paternal Grandmother   . Stroke Paternal Grandmother     Social History   Socioeconomic History  . Marital status: Widowed    Spouse name: Not on file  . Number of children: Not on file  . Years of education: Not on file  . Highest education level: Not on file  Occupational History  . Not on file  Tobacco Use  . Smoking status: Former Smoker    Packs/day: 0.50    Years: 25.00    Pack years: 12.50    Types: Cigarettes    Quit date: 06/03/2016    Years since quitting: 3.6  . Smokeless tobacco: Never Used  Vaping Use  . Vaping Use: Never used  Substance and Sexual Activity  . Alcohol use: No    Alcohol/week: 0.0 standard drinks  . Drug use: No  . Sexual activity: Not Currently  Other Topics Concern  . Not on file  Social History Narrative  . Not on file   Social Determinants of Health   Financial Resource Strain: Low Risk   . Difficulty of Paying Living Expenses: Not hard at all  Food Insecurity: No Food Insecurity  . Worried About Programme researcher, broadcasting/film/video in the Last Year: Never true  . Ran Out of Food in the Last Year: Never true  Transportation Needs: No Transportation Needs  . Lack of Transportation (Medical): No  . Lack of Transportation (Non-Medical): No  Physical Activity:   . Days of Exercise per Week: Not on file  . Minutes of Exercise per Session: Not on file  Stress:   . Feeling of Stress : Not on file  Social  Connections:   . Frequency of Communication with Friends and Family: Not on file  . Frequency of Social Gatherings with Friends and Family: Not on file  . Attends Religious Services: Not on file  . Active Member of Clubs or Organizations: Not on file  . Attends Banker Meetings: Not on file  . Marital Status: Not on file  Intimate Partner Violence: Not At Risk  . Fear of Current or Ex-Partner: No  . Emotionally Abused: No  . Physically Abused: No  . Sexually Abused: No    ROS Review  of Systems  Constitutional: Negative for fever.  HENT: Negative for ear pain and sore throat.   Respiratory: Positive for shortness of breath. Negative for hemoptysis, sputum production and wheezing.   Cardiovascular: Positive for orthopnea and PND. Negative for chest pain, claudication, leg swelling and syncope.  Gastrointestinal: Negative for abdominal pain and vomiting.  Musculoskeletal: Negative for neck pain.  Skin: Negative for rash.  Neurological: Negative for headaches.    Objective:   Today's Vitals: BP (!) 150/86   Pulse 72   Ht 5\' 6"  (1.676 m)   Wt 112 lb 3.2 oz (50.9 kg)   SpO2 94% Comment: did 5 min walk test and o2 dropped to 93% room air  BMI 18.11 kg/m   Physical Exam  Assessment & Plan:   Problem List Items Addressed This Visit      Respiratory   Bronchitis    Patient has chronic bronchitis due to smoking in the past.  She has quit smoking but complains of shortness of breath at rest and on exertion also complains of orthopnea.  We did a walking test for 3-minute and she became short of breath but her oxygen level did not drop  on physical examination she was found to be wheezing on the both side.  She is using her inhaler regularly.  I will plan to do a CT scan of the chest also an echocardiogram to evaluate the left and right ventricular function.  Her EKG shows right ventricular hypertrophy.        Endocrine   Hypothyroidism    Last TSH level was  normal.      Type 2 diabetes mellitus with hyperlipidemia (HCC)    Blood sugar stable on Metformin.      Relevant Medications   losartan-hydrochlorothiazide (HYZAAR) 50-12.5 MG tablet     Musculoskeletal and Integument   Sacroiliac joint dysfunction    Patient has a chronic pain in the sacroiliac joint.  She has lost a lot of weight        Other   Shortness of breath - Primary    Electrocardiogram does not show any acute changes there are some changes suggestive of right ventricular hypertrophy.      Relevant Medications   losartan-hydrochlorothiazide (HYZAAR) 50-12.5 MG tablet   Other Relevant Orders   EKG 12-Lead      Outpatient Encounter Medications as of 02/02/2020  Medication Sig  . acetaminophen (TYLENOL) 500 MG tablet Take by mouth as needed.  Marland Kitchen. albuterol (PROVENTIL HFA;VENTOLIN HFA) 108 (90 Base) MCG/ACT inhaler Inhale 2 puffs into the lungs every 6 (six) hours as needed for wheezing or shortness of breath.  Marland Kitchen. alendronate (FOSAMAX) 70 MG tablet Take 70 mg by mouth once a week.  Marland Kitchen. aspirin 81 MG tablet Take 81 mg by mouth daily.  . Azelastine HCl 137 MCG/SPRAY SOLN daily.  . fluticasone (FLONASE) 50 MCG/ACT nasal spray 2 sprays daily.  Marland Kitchen. guaifenesin (HUMIBID E) 400 MG TABS tablet Take by mouth as needed.  . hydrocortisone (ANUSOL-HC) 2.5 % rectal cream Place 1 application rectally 3 (three) times daily as needed for hemorrhoids or itching. Reported on 12/02/2015  . LORazepam (ATIVAN) 0.5 MG tablet Take 0.5 mg by mouth at bedtime.  Marland Kitchen. lubiprostone (AMITIZA) 8 MCG capsule Take 8 mcg by mouth 2 (two) times daily with a meal.  . metFORMIN (GLUCOPHAGE) 500 MG tablet Take 500 mg by mouth daily with breakfast.   . ondansetron (ZOFRAN ODT) 4 MG disintegrating tablet Take 1 tablet (4 mg  total) by mouth every 8 (eight) hours as needed for nausea or vomiting.  . pantoprazole (PROTONIX) 40 MG tablet Take 40 mg by mouth daily.  . simvastatin (ZOCOR) 20 MG tablet Take 20 mg by mouth  daily at 6 PM.  . SYMBICORT 160-4.5 MCG/ACT inhaler Inhale 2 puffs into the lungs 2 (two) times daily.  Marland Kitchen tiotropium (SPIRIVA) 18 MCG inhalation capsule Place 18 mcg into inhaler and inhale daily. Reported on 08/07/2015  . FLUoxetine (PROZAC) 20 MG capsule Take 1 capsule (20 mg total) by mouth daily.  Marland Kitchen levothyroxine (SYNTHROID) 112 MCG tablet Take 1 tablet (112 mcg total) by mouth daily before breakfast.  . losartan-hydrochlorothiazide (HYZAAR) 50-12.5 MG tablet Take 1 tablet by mouth daily.  . [DISCONTINUED] busPIRone (BUSPAR) 7.5 MG tablet Take 7.5 mg by mouth 2 (two) times daily. (Patient not taking: Reported on 12/13/2019)  . [DISCONTINUED] calcium-vitamin D (CALCIUM 500/D) 500-200 MG-UNIT tablet Take 1 tablet by mouth daily. (Patient not taking: Reported on 02/02/2020)  . [DISCONTINUED] Melatonin 3 MG TABS Take 3 mg by mouth at bedtime. (Patient not taking: Reported on 02/02/2020)  . [DISCONTINUED] Potassium 99 MG TABS Take 1 tablet by mouth daily. (Patient not taking: Reported on 02/02/2020)   No facility-administered encounter medications on file as of 02/02/2020.  Report of the EKG.  Electrocardiogram revealed normal sinus rhythm right ventricular hypertrophy is noted.  No acute changes were identified.  Follow-up: No follow-ups on file.   Corky Downs, MD

## 2020-02-02 NOTE — Assessment & Plan Note (Signed)
Blood sugar stable on Metformin.

## 2020-02-02 NOTE — Assessment & Plan Note (Signed)
Patient has chronic bronchitis due to smoking in the past.  She has quit smoking but complains of shortness of breath at rest and on exertion also complains of orthopnea.  We did a walking test for 3-minute and she became short of breath but her oxygen level did not drop  on physical examination she was found to be wheezing on the both side.  She is using her inhaler regularly.  I will plan to do a CT scan of the chest also an echocardiogram to evaluate the left and right ventricular function.  Her EKG shows right ventricular hypertrophy.

## 2020-02-02 NOTE — Assessment & Plan Note (Signed)
Patient has a chronic pain in the sacroiliac joint.  She has lost a lot of weight

## 2020-02-19 ENCOUNTER — Ambulatory Visit: Payer: Medicare HMO

## 2020-02-26 ENCOUNTER — Other Ambulatory Visit: Payer: Self-pay

## 2020-02-26 NOTE — Patient Outreach (Signed)
[facility]) Care Management  [facility] Care Manager  02/26/2020   [name] 1946-01-08 [id number]  Subjective: Telephone call to patient for disease management support. Patient now has [employer] and will be changing PCP again due to network coverage.  She will be seeing Dr. Burney Gauze on 02-27-20.  Discussed referring patient back to health coach due to insurance. She verbalized understanding. Patient reports she is doing well with breathing. Reviewed COPD management with patient.  She verbalized understanding and voices no concerns.    Objective:   Encounter Medications:  Outpatient Encounter Medications as of 02/26/2020  Medication Sig Note  . acetaminophen (TYLENOL) 500 MG tablet Take by mouth as needed.   Marland Kitchen albuterol (PROVENTIL HFA;VENTOLIN HFA) 108 (90 Base) MCG/ACT inhaler Inhale 2 puffs into the lungs every 6 (six) hours as needed for wheezing or shortness of breath. 05/30/2019: Patient reports uses "rarely" reports used last "a couple of days ago"  . alendronate (FOSAMAX) 70 MG tablet Take 70 mg by mouth once a week.   Marland Kitchen aspirin 81 MG tablet Take 81 mg by mouth daily.   . Azelastine HCl 137 MCG/SPRAY SOLN daily.   Marland Kitchen FLUoxetine (PROZAC) 20 MG capsule Take 1 capsule (20 mg total) by mouth daily. 09/20/2019: Reports taking 40 mg daily  . fluticasone (FLONASE) 50 MCG/ACT nasal spray 2 sprays daily.   Marland Kitchen guaifenesin (HUMIBID E) 400 MG TABS tablet Take by mouth as needed.   . hydrocortisone (ANUSOL-HC) 2.5 % rectal cream Place 1 application rectally 3 (three) times daily as needed for hemorrhoids or itching. Reported on 12/02/2015   . levothyroxine (SYNTHROID) 112 MCG tablet Take 1 tablet (112 mcg total) by mouth daily before breakfast. 09/20/2019: Reports taking 88 mcg daily  . LORazepam (ATIVAN) 0.5 MG tablet Take 0.5 mg by mouth at bedtime.   Marland Kitchen losartan-hydrochlorothiazide (HYZAAR) 50-12.5 MG tablet Take 1 tablet by mouth daily.   Marland Kitchen lubiprostone (AMITIZA) 8 MCG capsule Take 8 mcg  by mouth 2 (two) times daily with a meal.   . metFORMIN (GLUCOPHAGE) 500 MG tablet Take 500 mg by mouth daily with breakfast.    . ondansetron (ZOFRAN ODT) 4 MG disintegrating tablet Take 1 tablet (4 mg total) by mouth every 8 (eight) hours as needed for nausea or vomiting.   . pantoprazole (PROTONIX) 40 MG tablet Take 40 mg by mouth daily.   . simvastatin (ZOCOR) 20 MG tablet Take 20 mg by mouth daily at 6 PM.   . SYMBICORT 160-4.5 MCG/ACT inhaler Inhale 2 puffs into the lungs 2 (two) times daily.   Marland Kitchen tiotropium (SPIRIVA) 18 MCG inhalation capsule Place 18 mcg into inhaler and inhale daily. Reported on 08/07/2015    No facility-administered encounter medications on file as of 02/26/2020.    Functional Status:  In your present state of health, do you have any difficulty performing the following activities: 12/13/2019 09/20/2019  Hearing? N N  Vision? N N  Difficulty concentrating or making decisions? N N  Walking or climbing stairs? Y Y  Comment shortness of breath short of breath with distances or climbing stairs  Dressing or bathing? Y Y  Comment shortness of breath aide assist with bathing  Doing errands, shopping? Y Y  Comment shortness of breath case workers assist with grocery shopping  Preparing Food and eating ? N Y  Comment - aide assist with cooking  Using the Toilet? N N  In the past six months, have you accidently leaked urine? N N  Do  you have problems with loss of bowel control? N N  Managing your Medications? N N  Managing your Finances? N N  Housekeeping or managing your Housekeeping? N Y  Comment - aide does cleaning  Some recent data might be hidden    Fall/Depression Screening: Fall Risk  12/13/2019 09/20/2019 05/30/2019  Falls in the past year? 0 0 0  Comment - - -  Number falls in past yr: - 0 0  Comment - - No falls reported over last 3 years  Injury with Fall? - 0 0  Comment - - N/A- no falls reported  Risk for fall due to : - Medication side effect;Impaired  balance/gait;Impaired mobility Impaired balance/gait;Medication side effect  Follow up - Falls evaluation completed;Education provided;Falls prevention discussed Falls prevention discussed   PHQ 2/9 Scores 12/13/2019 09/20/2019 05/30/2019 02/01/2018 01/07/2016 12/02/2015 11/06/2015  PHQ - 2 Score 0 0 0 1 0 0 0  PHQ- 9 Score - - - 6 - - -  Exception Documentation - - - - - - -    Assessment: Patient continues to manage chronic conditions and benefits from disease management support.     Goals Addressed            This Visit's Progress   . Manage Fatigue (Tiredness)       Follow Up Date 06/16/20   - eat healthy - get at least 7 to 8 hours of sleep at night    Why is this important?   Feeling tired or worn out is a common symptom of COPD (chronic obstructive pulmonary disease).  Learning when you feel your best and when you need rest is important.  Managing the tiredness (fatigue) will help you be active and enjoy life.     Notes:     . Track and Manage My Symptoms       Follow Up Date 06/16/20    - develop a rescue plan - follow rescue plan if symptoms flare-up - keep follow-up appointments    Why is this important?   Tracking your symptoms and other information about your health helps your doctor plan your care.  Write down the symptoms, the time of day, what you were doing and what medicine you are taking.  You will soon learn how to manage your symptoms.     Notes: Patient knows to pace self with activities.       Plan: RN CM will refer patient to health coach for disease management follow up and patient agreeable.

## 2020-02-27 ENCOUNTER — Ambulatory Visit (INDEPENDENT_AMBULATORY_CARE_PROVIDER_SITE_OTHER): Payer: Medicaid Other | Admitting: Internal Medicine

## 2020-02-27 ENCOUNTER — Other Ambulatory Visit: Payer: Self-pay

## 2020-02-27 ENCOUNTER — Encounter: Payer: Self-pay | Admitting: Internal Medicine

## 2020-02-27 VITALS — BP 120/52 | HR 85 | Temp 97.4°F | Resp 16 | Ht 66.0 in | Wt 114.8 lb

## 2020-02-27 DIAGNOSIS — R799 Abnormal finding of blood chemistry, unspecified: Secondary | ICD-10-CM

## 2020-02-27 DIAGNOSIS — I6521 Occlusion and stenosis of right carotid artery: Secondary | ICD-10-CM

## 2020-02-27 DIAGNOSIS — E782 Mixed hyperlipidemia: Secondary | ICD-10-CM | POA: Diagnosis not present

## 2020-02-27 DIAGNOSIS — M810 Age-related osteoporosis without current pathological fracture: Secondary | ICD-10-CM

## 2020-02-27 DIAGNOSIS — R0600 Dyspnea, unspecified: Secondary | ICD-10-CM | POA: Diagnosis not present

## 2020-02-27 DIAGNOSIS — R0609 Other forms of dyspnea: Secondary | ICD-10-CM

## 2020-02-27 DIAGNOSIS — I9589 Other hypotension: Secondary | ICD-10-CM

## 2020-02-27 DIAGNOSIS — R0602 Shortness of breath: Secondary | ICD-10-CM

## 2020-02-27 DIAGNOSIS — E119 Type 2 diabetes mellitus without complications: Secondary | ICD-10-CM

## 2020-02-27 DIAGNOSIS — R0902 Hypoxemia: Secondary | ICD-10-CM

## 2020-02-27 DIAGNOSIS — E039 Hypothyroidism, unspecified: Secondary | ICD-10-CM

## 2020-02-27 DIAGNOSIS — J449 Chronic obstructive pulmonary disease, unspecified: Secondary | ICD-10-CM

## 2020-02-27 MED ORDER — IPRATROPIUM-ALBUTEROL 0.5-2.5 (3) MG/3ML IN SOLN: RESPIRATORY_TRACT | 1 refills | Status: DC

## 2020-02-27 NOTE — Progress Notes (Signed)
[facility] [address] [place], [place] [address]  Internal MEDICINE  Office Visit Note  Patient Name: Danielle KaysBetty C Poole  [id number] 25, 2047  [id number]  Date of Service: 02/27/2020   Complaints/HPI Pt is here for establishment of PCP. Chief Complaint  Patient presents with  . New Patient (Initial Visit)    congestion, cough started 4 days ago, pt wants flu shot  . Diabetes  . Depression  . Hypertension  . Anxiety  . COPD  . policy update form    received  . Quality Metric Gaps    foot exam, eye exam, colonoscopy, dexa   HPI Pt is here for establishment of PCP. 1. Pt has been having shortness of breath for last few months, this is mostly with exertion. Pt has been a smoker in the past, quit about 3 years ago. She is on Symbicort and albuterol, no recent hospital admission, CT chest was ordered by another physician but she cancelled. Pt is using her rolling walker with seat to sit down after short period of walking. Chest pressure is present as well 2. Pt also needs monitoring of her carotid artery occlusion, she believes she was told she has 60% stenosis on right side. 3. She was started on BP medicine ( she only took half of her Losartan/hctz), her blood pressure is on the low side today 4. She sees her psychiatrists for mental health  5. Diabetes is well controlled, her Hg a1c is below 6.0, on metformin  6. She was told that she might have osteoporosis. Takes Fosamax, she also has hypothyroidism and HLD   Current Medication: Outpatient Encounter Medications as of 02/27/2020  Medication Sig Note  . acetaminophen (TYLENOL) 500 MG tablet Take by mouth as needed.   Marland Kitchen. albuterol (PROVENTIL HFA;VENTOLIN HFA) 108 (90 Base) MCG/ACT inhaler Inhale 2 puffs into the lungs every 6 (six) hours as needed for wheezing or shortness of breath. 05/30/2019: Patient reports uses "rarely" reports used last "a couple of days ago"  . alendronate (FOSAMAX) 70 MG tablet Take 70 mg by mouth once a  week.   Marland Kitchen. aspirin 81 MG tablet Take 81 mg by mouth daily.   . Azelastine HCl 137 MCG/SPRAY SOLN daily.   . Cyanocobalamin (VITAMIN B-12) 1000 MCG/15ML LIQD Take 1,200 mcg by mouth daily.   Marland Kitchen. FLUoxetine (PROZAC) 20 MG capsule Take 20 mg by mouth 2 (two) times daily.   . fluticasone (FLONASE) 50 MCG/ACT nasal spray 2 sprays daily.   Marland Kitchen. guaifenesin (HUMIBID E) 400 MG TABS tablet Take by mouth as needed.   . hydrocortisone (ANUSOL-HC) 2.5 % rectal cream Place 1 application rectally 3 (three) times daily as needed for hemorrhoids or itching. Reported on 12/02/2015   . levothyroxine (SYNTHROID) 88 MCG tablet Take 88 mcg by mouth daily before breakfast.   . LORazepam (ATIVAN) 0.5 MG tablet Take 0.5 mg by mouth at bedtime.   . meclizine (ANTIVERT) 25 MG tablet Take 25 mg by mouth as needed for dizziness.   . metFORMIN (GLUCOPHAGE) 500 MG tablet Take 500 mg by mouth daily with breakfast.    . Multiple Vitamin (ONE-A-DAY 55 PLUS PO) Take 1 tablet by mouth daily.   . simvastatin (ZOCOR) 20 MG tablet Take 20 mg by mouth daily at 6 PM.   . SYMBICORT 160-4.5 MCG/ACT inhaler Inhale 2 puffs into the lungs 2 (two) times daily.   Marland Kitchen. tiotropium (SPIRIVA) 18 MCG inhalation capsule Place 18 mcg into inhaler and inhale daily. Reported on 08/07/2015   . [  DISCONTINUED] losartan-hydrochlorothiazide (HYZAAR) 50-12.5 MG tablet Take 1 tablet by mouth daily.   . [DISCONTINUED] lubiprostone (AMITIZA) 8 MCG capsule Take 8 mcg by mouth 2 (two) times daily with a meal.   . [DISCONTINUED] pantoprazole (PROTONIX) 40 MG tablet Take 40 mg by mouth daily.   Marland Kitchen ipratropium-albuterol (DUONEB) 0.5-2.5 (3) MG/3ML SOLN One vial 3 x  aday for copd with hypoxia   . [DISCONTINUED] FLUoxetine (PROZAC) 20 MG capsule Take 1 capsule (20 mg total) by mouth daily. 09/20/2019: Reports taking 40 mg daily  . [DISCONTINUED] levothyroxine (SYNTHROID) 112 MCG tablet Take 1 tablet (112 mcg total) by mouth daily before breakfast. 09/20/2019: Reports taking 88 mcg  daily  . [DISCONTINUED] ondansetron (ZOFRAN ODT) 4 MG disintegrating tablet Take 1 tablet (4 mg total) by mouth every 8 (eight) hours as needed for nausea or vomiting. (Patient not taking: Reported on 02/27/2020)    No facility-administered encounter medications on file as of 02/27/2020.    Surgical History: Past Surgical History:  Procedure Laterality Date  . ABDOMINAL HYSTERECTOMY    . ESOPHAGOGASTRODUODENOSCOPY (EGD) WITH PROPOFOL N/A 01/10/2015   Procedure: ESOPHAGOGASTRODUODENOSCOPY (EGD) WITH PROPOFOL;  Surgeon: Wallace Cullens, MD;  Location: [facility] ENDOSCOPY;  Service: Gastroenterology;  Laterality: N/A;  . RECTAL SURGERY    . SAVORY DILATION N/A 01/10/2015   Procedure: SAVORY DILATION;  Surgeon: Wallace Cullens, MD;  Location: [facility] ENDOSCOPY;  Service: Gastroenterology;  Laterality: N/A;  . toenail removal Bilateral    great toes  . TONSILLECTOMY AND ADENOIDECTOMY      Medical History: Past Medical History:  Diagnosis Date  . Anxiety   . Chest pain   . CHF (congestive heart failure) (HCC)   . COPD (chronic obstructive pulmonary disease) (HCC)   . Depression   . Diabetes mellitus without complication (HCC)   . Fibromyalgia   . Graves disease   . Hypertension   . IBS (irritable bowel syndrome)   . PTSD (post-traumatic stress disorder)   . Sinus problem   . Spinal stenosis     Family History: Family History  Problem Relation Age of Onset  . Alcohol abuse Mother   . Depression Mother   . Varicose Veins Mother   . Alcohol abuse Father   . Depression Father   . Alcohol abuse Sister   . Asthma Sister   . COPD Sister   . Depression Sister   . Heart disease Sister   . Hypertension Sister   . Diabetes Sister   . Depression Maternal Grandmother   . Diabetes Paternal Grandmother   . Stroke Paternal Grandmother     Social History   Socioeconomic History  . Marital status: Widowed    Spouse name: Not on file  . Number of children: Not on file  . Years of education: Not on  file  . Highest education level: Not on file  Occupational History  . Not on file  Tobacco Use  . Smoking status: Former Smoker    Packs/day: 0.50    Years: 25.00    Pack years: 12.50    Types: Cigarettes    Quit date: 06/03/2016    Years since quitting: 3.7  . Smokeless tobacco: Never Used  Vaping Use  . Vaping Use: Never used  Substance and Sexual Activity  . Alcohol use: No    Alcohol/week: 0.0 standard drinks  . Drug use: No  . Sexual activity: Not Currently  Other Topics Concern  . Not on file  Social History Narrative  .  Not on file   Social Determinants of Health   Financial Resource Strain: Low Risk   . Difficulty of Paying Living Expenses: Not hard at all  Food Insecurity: No Food Insecurity  . Worried About Programme researcher, broadcasting/film/video in the Last Year: Never true  . Ran Out of Food in the Last Year: Never true  Transportation Needs: No Transportation Needs  . Lack of Transportation (Medical): No  . Lack of Transportation (Non-Medical): No  Physical Activity:   . Days of Exercise per Week: Not on file  . Minutes of Exercise per Session: Not on file  Stress:   . Feeling of Stress : Not on file  Social Connections:   . Frequency of Communication with Friends and Family: Not on file  . Frequency of Social Gatherings with Friends and Family: Not on file  . Attends Religious Services: Not on file  . Active Member of Clubs or Organizations: Not on file  . Attends Banker Meetings: Not on file  . Marital Status: Not on file  Intimate Partner Violence: Not At Risk  . Fear of Current or Ex-Partner: No  . Emotionally Abused: No  . Physically Abused: No  . Sexually Abused: No     Review of Systems  Constitutional: Negative for chills, diaphoresis and fatigue.  HENT: Negative for ear pain, postnasal drip and sinus pressure.   Eyes: Negative for photophobia, discharge, redness, itching and visual disturbance.  Respiratory: Positive for chest tightness and  shortness of breath. Negative for cough and wheezing.   Cardiovascular: Negative for chest pain, palpitations and leg swelling.  Gastrointestinal: Negative for abdominal pain, constipation, diarrhea, nausea and vomiting.  Genitourinary: Negative for dysuria and flank pain.  Musculoskeletal: Negative for arthralgias, back pain, gait problem and neck pain.  Skin: Negative for color change.  Allergic/Immunologic: Negative for environmental allergies and food allergies.  Neurological: Negative for dizziness and headaches.  Hematological: Does not bruise/bleed easily.  Psychiatric/Behavioral: Negative for agitation, behavioral problems (depression) and hallucinations.    Vital Signs: BP (!) 120/52   Pulse 85   Temp (!) 97.4 F (36.3 C)   Resp 16   Ht 5\' 6"  (1.676 m)   Wt 114 lb 12.8 oz (52.1 kg)   SpO2 99%   BMI 18.53 kg/m    Physical Exam Constitutional:      Appearance: Normal appearance.     Comments: Takes frequent stops during conversation   HENT:     Head: Normocephalic and atraumatic.     Nose: Nose normal.  Eyes:     Extraocular Movements: Extraocular movements intact.     Pupils: Pupils are equal, round, and reactive to light.  Cardiovascular:     Rate and Rhythm: Normal rate.     Pulses: Normal pulses.     Heart sounds: Normal heart sounds.  Pulmonary:     Breath sounds: Wheezing present.     Comments: Decreased air entry bilaterally  Abdominal:     General: Abdomen is flat.     Palpations: Abdomen is soft.  Musculoskeletal:     Cervical back: Normal range of motion and neck supple.  Skin:    General: Skin is warm and dry.  Neurological:     General: No focal deficit present.     Mental Status: She is alert and oriented to person, place, and time. Mental status is at baseline.  Psychiatric:        Mood and Affect: Mood normal.  Behavior: Behavior normal.        Thought Content: Thought content normal.        Judgment: Judgment normal.     Assessment/Plan: 1. Dyspnea on exertion Multifactorial, needs to have more work up to look to proper diagnose and treat, previous records are reviewed as well, Alpha 1 antitrypsin work up was done, PFT does show severe COPD  - CT Chest Wo Contrast; Future - 6 minute walk   2. Atherosclerosis of right carotid artery Will check on progression of disease  - US Carotid Duplex Bilateral; Future  3. Senile osteoporosis Continue to take Fosamax, will re-evaluate  - DG Bone Density; Future  4. Diet-controlled type 2 diabetes mellitus (HCC) Stop metformin, pt hg A1c is below 6  5. Abnormal blood chemistry test Her B12 is low, she has been on supplements   6. COPD with hypoxia (HCC) Pt desaturated to 86% during 6 min walk, needs urgent O2 therapy  For home use only DME oxygen ipratropium-albuterol (DUONEB) 0.5-2.5 (3) MG/3ML SOLN; One vial 3 x  aday for copd with hypoxia  Dispense: 180 mL; Refill: 1  7. Hypothyroidism, unspecified type Continue Synthroid as before  8. Iatrogenic hypotension Will dc Losartan/hct for now, might need low dose Cardizem   9. Mixed hyperlipidemia Continue on Simvastatin   General Counseling: Wende verbalizes understanding of the findings of todays visit and agrees with plan of treatment. I have discussed any further diagnostic evaluation that may be needed or ordered today. We also reviewed her medications today. she has been encouraged to call the office with any questions or concerns that should arise related to todays visit.  High level, complex medical problems, critical decision making   Orders Placed This Encounter  Procedures  . For home use only DME oxygen  . DG Bone Density  . US Carotid Duplex Bilateral  . CT Chest Wo Contrast  . 6 minute walk    Meds ordered this encounter  Medications  . ipratropium-albuterol (DUONEB) 0.5-2.5 (3) MG/3ML SOLN    Sig: One vial 3 x  aday for copd with hypoxia    Dispense:  180 mL    Refill:  1     Time spent:45 Minutes

## 2020-02-27 NOTE — Addendum Note (Signed)
Addended by: Lyndon Code on: 02/27/2020 08:13 PM   Modules accepted: Level of Service

## 2020-02-28 ENCOUNTER — Ambulatory Visit: Payer: Self-pay

## 2020-03-07 ENCOUNTER — Ambulatory Visit
Admission: RE | Admit: 2020-03-07 | Discharge: 2020-03-07 | Disposition: A | Discharge status: Home / Self Care | Payer: Medicare HMO | Source: Ambulatory Visit | Attending: Nurse Practitioner | Admitting: Nurse Practitioner

## 2020-03-07 ENCOUNTER — Other Ambulatory Visit: Payer: Self-pay

## 2020-03-07 DIAGNOSIS — R0600 Dyspnea, unspecified: Secondary | ICD-10-CM

## 2020-03-07 DIAGNOSIS — R0609 Other forms of dyspnea: Secondary | ICD-10-CM

## 2020-03-10 NOTE — Progress Notes (Signed)
Coronary atherosclerosis noted.

## 2020-03-12 ENCOUNTER — Encounter: Payer: Self-pay | Admitting: Internal Medicine

## 2020-03-12 ENCOUNTER — Other Ambulatory Visit: Payer: Self-pay

## 2020-03-12 ENCOUNTER — Ambulatory Visit (INDEPENDENT_AMBULATORY_CARE_PROVIDER_SITE_OTHER): Payer: Medicaid Other | Admitting: Internal Medicine

## 2020-03-12 DIAGNOSIS — I2721 Secondary pulmonary arterial hypertension: Secondary | ICD-10-CM

## 2020-03-12 DIAGNOSIS — J449 Chronic obstructive pulmonary disease, unspecified: Secondary | ICD-10-CM

## 2020-03-12 DIAGNOSIS — R0600 Dyspnea, unspecified: Secondary | ICD-10-CM

## 2020-03-12 DIAGNOSIS — E1169 Type 2 diabetes mellitus with other specified complication: Secondary | ICD-10-CM

## 2020-03-12 DIAGNOSIS — I2510 Atherosclerotic heart disease of native coronary artery without angina pectoris: Secondary | ICD-10-CM

## 2020-03-12 DIAGNOSIS — Z0001 Encounter for general adult medical examination with abnormal findings: Secondary | ICD-10-CM | POA: Diagnosis not present

## 2020-03-12 DIAGNOSIS — E119 Type 2 diabetes mellitus without complications: Secondary | ICD-10-CM

## 2020-03-12 DIAGNOSIS — R300 Dysuria: Secondary | ICD-10-CM

## 2020-03-12 DIAGNOSIS — R0609 Other forms of dyspnea: Secondary | ICD-10-CM

## 2020-03-12 DIAGNOSIS — I2584 Coronary atherosclerosis due to calcified coronary lesion: Secondary | ICD-10-CM

## 2020-03-12 DIAGNOSIS — R0902 Hypoxemia: Secondary | ICD-10-CM

## 2020-03-12 LAB — POCT GLYCOSYLATED HEMOGLOBIN (HGB A1C): Hemoglobin A1C: 5.7 % — AB (ref 4.0–5.6)

## 2020-03-12 LAB — POCT UA - MICROALBUMIN
Albumin/Creatinine Ratio, Urine, POC: 10
Creatinine, POC: 50 mg/dL
Microalbumin Ur, POC: <30 mg/L

## 2020-03-12 MED ORDER — AZITHROMYCIN 250 MG PO TABS: ORAL_TABLET | ORAL | 0 refills | Status: DC

## 2020-03-12 MED ORDER — ROSUVASTATIN CALCIUM 20 MG PO TABS: 20.0000 mg | ORAL_TABLET | Freq: Every day | ORAL | 3 refills | Status: DC

## 2020-03-12 NOTE — Progress Notes (Signed)
[facility] [address] [place], [place] [address]  Internal MEDICINE  Office Visit Note  Patient Name: [name]  [id number]  [id number]  Date of Service: 03/13/2020  Chief Complaint  Patient presents with  . Medicare Wellness  . Quality Metric Gaps    Hep C screen, Foot exam, urine microalbumin, colonoscopy, pna vacc, A1C, flu vacc  . controlled substance policy    acknowledged  . Referral    podiatrist for nail trimming    HPI Pt is here for routine health maintenance examination. She was seen previously as a new pt. She was started on O2 therapy for hypoxia, she now has her O2 however the tank is heavy and it is hard to carry.  She is here to discuss her CT chest ( severe copd/ coronary calcium and atherosclerosis and PAH)  Pt will like to see podiatry as well. Continues to see psych, will like to see podiatry and pulmonary.    Current Medication: Outpatient Encounter Medications as of 03/12/2020  Medication Sig Note  . acetaminophen (TYLENOL) 500 MG tablet Take by mouth as needed.   Marland Kitchen albuterol (PROVENTIL HFA;VENTOLIN HFA) 108 (90 Base) MCG/ACT inhaler Inhale 2 puffs into the lungs every 6 (six) hours as needed for wheezing or shortness of breath. 05/30/2019: Patient reports uses "rarely" reports used last "a couple of days ago"  . alendronate (FOSAMAX) 70 MG tablet Take 70 mg by mouth once a week.   Marland Kitchen aspirin 81 MG tablet Take 81 mg by mouth daily.   . Azelastine HCl 137 MCG/SPRAY SOLN daily.   . Cyanocobalamin (VITAMIN B-12) 1000 MCG/15ML LIQD Take 1,200 mcg by mouth daily.   Marland Kitchen FLUoxetine (PROZAC) 20 MG capsule Take 20 mg by mouth 2 (two) times daily.   . fluticasone (FLONASE) 50 MCG/ACT nasal spray 2 sprays daily.   Marland Kitchen guaifenesin (HUMIBID E) 400 MG TABS tablet Take by mouth as needed.   . hydrocortisone (ANUSOL-HC) 2.5 % rectal cream Place 1 application rectally 3 (three) times daily as needed for hemorrhoids or itching. Reported on 12/02/2015   .  ipratropium-albuterol (DUONEB) 0.5-2.5 (3) MG/3ML SOLN One vial 3 x  aday for copd with hypoxia   . levothyroxine (SYNTHROID) 88 MCG tablet Take 88 mcg by mouth daily before breakfast.   . LORazepam (ATIVAN) 0.5 MG tablet Take 0.5 mg by mouth at bedtime.   . meclizine (ANTIVERT) 25 MG tablet Take 25 mg by mouth as needed for dizziness.   . metFORMIN (GLUCOPHAGE) 500 MG tablet Take 500 mg by mouth daily with breakfast.    . Multiple Vitamin (ONE-A-DAY 55 PLUS PO) Take 1 tablet by mouth daily.   . SYMBICORT 160-4.5 MCG/ACT inhaler Inhale 2 puffs into the lungs 2 (two) times daily.   Marland Kitchen tiotropium (SPIRIVA) 18 MCG inhalation capsule Place 18 mcg into inhaler and inhale daily. Reported on 08/07/2015   . [DISCONTINUED] simvastatin (ZOCOR) 20 MG tablet Take 20 mg by mouth daily at 6 PM.   . azithromycin (ZITHROMAX) 250 MG tablet Take one tab a day for 10 days for uri   . rosuvastatin (CRESTOR) 20 MG tablet Take 1 tablet (20 mg total) by mouth daily.    No facility-administered encounter medications on file as of 03/12/2020.    Surgical History: Past Surgical History:  Procedure Laterality Date  . ABDOMINAL HYSTERECTOMY    . ESOPHAGOGASTRODUODENOSCOPY (EGD) WITH PROPOFOL N/A 01/10/2015   Procedure: ESOPHAGOGASTRODUODENOSCOPY (EGD) WITH PROPOFOL;  Surgeon: Wallace Cullens, MD;  Location:  [facility] ENDOSCOPY;  Service: Gastroenterology;  Laterality: N/A;  . RECTAL SURGERY    . SAVORY DILATION N/A 01/10/2015   Procedure: SAVORY DILATION;  Surgeon: Wallace Cullens, MD;  Location: [facility] ENDOSCOPY;  Service: Gastroenterology;  Laterality: N/A;  . toenail removal Bilateral    great toes  . TONSILLECTOMY AND ADENOIDECTOMY      Medical History: Past Medical History:  Diagnosis Date  . Anxiety   . Chest pain   . CHF (congestive heart failure) (HCC)   . COPD (chronic obstructive pulmonary disease) (HCC)   . Depression   . Diabetes mellitus without complication (HCC)   . Fibromyalgia   . Graves disease   .  Hypertension   . IBS (irritable bowel syndrome)   . PTSD (post-traumatic stress disorder)   . Sinus problem   . Spinal stenosis     Family History: Family History  Problem Relation Age of Onset  . Alcohol abuse Mother   . Depression Mother   . Varicose Veins Mother   . Alcohol abuse Father   . Depression Father   . Alcohol abuse Sister   . Asthma Sister   . COPD Sister   . Depression Sister   . Heart disease Sister   . Hypertension Sister   . Diabetes Sister   . Depression Maternal Grandmother   . Diabetes Paternal Grandmother   . Stroke Paternal Grandmother     Review of Systems  Constitutional: Negative for chills, diaphoresis and fatigue.  HENT: Negative for ear pain, postnasal drip and sinus pressure.   Eyes: Negative for photophobia, discharge, redness, itching and visual disturbance.  Respiratory: Positive for shortness of breath. Negative for cough and wheezing.   Cardiovascular: Positive for chest pain. Negative for palpitations and leg swelling.  Gastrointestinal: Negative for abdominal pain, constipation, diarrhea, nausea and vomiting.  Genitourinary: Negative for dysuria and flank pain.  Musculoskeletal: Positive for arthralgias and back pain. Negative for gait problem and neck pain.  Skin: Negative for color change.  Allergic/Immunologic: Negative for environmental allergies and food allergies.  Neurological: Negative for dizziness and headaches.  Hematological: Does not bruise/bleed easily.  Psychiatric/Behavioral: Negative for agitation, behavioral problems (depression) and hallucinations.     Vital Signs: BP 130/68   Pulse 86   Temp 98.1 F (36.7 C)   Resp 16   Ht 5\' 6"  (1.676 m)   Wt 120 lb 3.2 oz (54.5 kg)   SpO2 99%   BMI 19.40 kg/m    Physical Exam Constitutional:      General: She is not in acute distress.    Appearance: She is well-developed. She is not diaphoretic.  HENT:     Head: Normocephalic and atraumatic.     Mouth/Throat:      Pharynx: No oropharyngeal exudate.  Eyes:     Pupils: Pupils are equal, round, and reactive to light.  Neck:     Thyroid: No thyromegaly.     Vascular: No JVD.     Trachea: No tracheal deviation.  Cardiovascular:     Rate and Rhythm: Normal rate and regular rhythm.     Heart sounds: Normal heart sounds. No murmur heard.  No friction rub. No gallop.   Pulmonary:     Effort: Pulmonary effort is normal. No respiratory distress.     Breath sounds: No wheezing or rales.  Chest:     Chest wall: No tenderness.     Breasts:        Right: No swelling.  Left: Normal. No swelling.  Abdominal:     General: Bowel sounds are normal.     Palpations: Abdomen is soft.  Musculoskeletal:        General: Normal range of motion.     Cervical back: Normal range of motion and neck supple.  Lymphadenopathy:     Cervical: No cervical adenopathy.  Skin:    General: Skin is warm and dry.  Neurological:     Mental Status: She is alert and oriented to person, place, and time.     Cranial Nerves: No cranial nerve deficit.  Psychiatric:        Behavior: Behavior normal.        Thought Content: Thought content normal.        Judgment: Judgment normal.      LABS: Recent Results (from the past 2160 hour(s))  UA/M w/rflx Culture, Routine     Status: None   Collection Time: 03/12/20 10:40 AM   Specimen: Urine   Urine  Result Value Ref Range   Specific Gravity, UA 1.013 1.005 - 1.030   pH, UA 6.5 5.0 - 7.5   Color, UA Yellow Yellow   Appearance Ur Clear Clear   Leukocytes,UA Negative Negative   Protein,UA Negative Negative/Trace   Glucose, UA Negative Negative   Ketones, UA Negative Negative   RBC, UA Negative Negative   Bilirubin, UA Negative Negative   Urobilinogen, Ur 0.2 0.2 - 1.0 mg/dL   Nitrite, UA Negative Negative   Microscopic Examination Comment     Comment: Microscopic follows if indicated.   Microscopic Examination See below:     Comment: Microscopic was indicated and was  performed.   Urinalysis Reflex Comment     Comment: This specimen will not reflex to a Urine Culture.  Microscopic Examination     Status: None   Collection Time: 03/12/20 10:40 AM   Urine  Result Value Ref Range   WBC, UA None seen 0 - 5 /hpf   RBC 0-2 0 - 2 /hpf   Epithelial Cells (non renal) 0-10 0 - 10 /hpf   Casts None seen None seen /lpf   Bacteria, UA None seen None seen/Few  POCT glycosylated hemoglobin (Hb A1C)     Status: Abnormal   Collection Time: 03/12/20 11:24 AM  Result Value Ref Range   Hemoglobin A1C 5.7 (A) 4.0 - 5.6 %   HbA1c POC (<> result, manual entry)     HbA1c, POC (prediabetic range)     HbA1c, POC (controlled diabetic range)    POCT UA - Microalbumin     Status: None   Collection Time: 03/12/20 11:24 AM  Result Value Ref Range   Microalbumin Ur, POC <30 mg/L   Creatinine, POC 50 mg/dL   Albumin/Creatinine Ratio, Urine, POC 10     Assessment/Plan: 1. Encounter for general adult medical examination with abnormal findings All preventive health maintenance is updated   2. Type 2 diabetes mellitus with other specified complication, without long-term current use of insulin (HCC) Pt has normal hg a1c , will monitor  - POCT glycosylated hemoglobin (Hb A1C) - POCT UA - Microalbumin - Ambulatory referral to Podiatry  3. COPD with hypoxia (HCC) Light wt bag, will need further work up. Add azithromycin for 10 days   - For home use only DME oxygen  4. Coronary artery calcification Add Crestor and DC simvastatin  - Ambulatory referral to Cardiology - ECHOCARDIOGRAM COMPLETE; Future  5. Pulmonary arterial hypertension (HCC) Pt might need cardiac  cath, both right and left  - Ambulatory referral to Pulmonology - Ambulatory referral to Cardiology - ECHOCARDIOGRAM COMPLETE; Future  6. Dyspnea on exertion Will continue on home o2 and Neb treatment  - ECHOCARDIOGRAM COMPLETE; Future  7. Dysuria - UA/M w/rflx Culture, Routine  General Counseling: Raychel  verbalizes understanding of the findings of todays visit and agrees with plan of treatment. I have discussed any further diagnostic evaluation that may be needed or ordered today. We also reviewed her medications today. she has been encouraged to call the office with any questions or concerns that should arise related to todays visit. Counseling: Cardiac risk factor modification:  1. Control blood pressure. 2. Exercise as prescribed. 3. Follow low sodium, low fat diet. and low fat and low cholestrol diet. 4. Take ASA 81mg  once a day. 5. Restricted calories diet to lose weight.   Orders Placed This Encounter  Procedures  . Microscopic Examination  . UA/M w/rflx Culture, Routine  . Ambulatory referral to Podiatry  . Ambulatory referral to Pulmonology  . Ambulatory referral to Cardiology  . POCT glycosylated hemoglobin (Hb A1C)  . POCT UA - Microalbumin  . ECHOCARDIOGRAM COMPLETE    Meds ordered this encounter  Medications  . rosuvastatin (CRESTOR) 20 MG tablet    Sig: Take 1 tablet (20 mg total) by mouth daily.    Dispense:  90 tablet    Refill:  3  . azithromycin (ZITHROMAX) 250 MG tablet    Sig: Take one tab a day for 10 days for uri    Dispense:  21 tablet    Refill:  0    Total time spent: 35Minutes  Time spent includes review of chart, medications, test results, and follow up plan with the patient.     Lyndon CodeFozia M Hendrick Pavich, MD  Internal Medicine

## 2020-03-13 ENCOUNTER — Telehealth: Payer: Self-pay

## 2020-03-13 LAB — MICROSCOPIC EXAMINATION
Bacteria, UA: NONE SEEN
Casts: NONE SEEN /lpf
WBC, UA: NONE SEEN /hpf (ref 0–5)

## 2020-03-13 LAB — UA/M W/RFLX CULTURE, ROUTINE
Bilirubin, UA: NEGATIVE
Glucose, UA: NEGATIVE
Ketones, UA: NEGATIVE
Leukocytes,UA: NEGATIVE
Nitrite, UA: NEGATIVE
Protein,UA: NEGATIVE
RBC, UA: NEGATIVE
Specific Gravity, UA: 1.013 (ref 1.005–1.030)
Urobilinogen, Ur: 0.2 mg/dL (ref 0.2–1.0)
pH, UA: 6.5 (ref 5.0–7.5)

## 2020-03-13 NOTE — Telephone Encounter (Signed)
Spoke with [facility] and they will pull pt chart and review and let us know if there is anything else needed for lightweight oxygen carrier. [name]

## 2020-03-14 ENCOUNTER — Telehealth: Payer: Self-pay

## 2020-03-14 NOTE — Telephone Encounter (Signed)
Scheduled appt from referral   Patient declined to share cardio medical hx.  She states she doesn't even want [place] to mess with that man she only saw him briefly.    Patient declined to provide hx of whom or where she was seen.

## 2020-03-14 NOTE — Telephone Encounter (Signed)
Looks like patient seen [facility] cardiology.  There is a stress test in [place] from 2018

## 2020-03-15 ENCOUNTER — Telehealth: Payer: Self-pay

## 2020-03-15 ENCOUNTER — Other Ambulatory Visit: Payer: Self-pay

## 2020-03-15 DIAGNOSIS — J209 Acute bronchitis, unspecified: Secondary | ICD-10-CM

## 2020-03-15 DIAGNOSIS — J440 Chronic obstructive pulmonary disease with acute lower respiratory infection: Secondary | ICD-10-CM

## 2020-03-15 NOTE — Telephone Encounter (Signed)
Called [facility] that we gave nebulizer to pt and put order in the system and also pt case worker he going to pick up today Quentin,pulliam

## 2020-03-22 ENCOUNTER — Other Ambulatory Visit: Payer: Medicare HMO

## 2020-03-22 ENCOUNTER — Ambulatory Visit: Payer: Medicaid Other

## 2020-03-22 ENCOUNTER — Other Ambulatory Visit: Payer: Self-pay

## 2020-03-22 DIAGNOSIS — I2510 Atherosclerotic heart disease of native coronary artery without angina pectoris: Secondary | ICD-10-CM

## 2020-03-22 DIAGNOSIS — I2721 Secondary pulmonary arterial hypertension: Secondary | ICD-10-CM

## 2020-03-22 DIAGNOSIS — E1169 Type 2 diabetes mellitus with other specified complication: Secondary | ICD-10-CM

## 2020-03-22 DIAGNOSIS — J449 Chronic obstructive pulmonary disease, unspecified: Secondary | ICD-10-CM

## 2020-03-22 DIAGNOSIS — R0902 Hypoxemia: Secondary | ICD-10-CM

## 2020-03-22 DIAGNOSIS — R0609 Other forms of dyspnea: Secondary | ICD-10-CM

## 2020-03-22 DIAGNOSIS — I2584 Coronary atherosclerosis due to calcified coronary lesion: Secondary | ICD-10-CM

## 2020-03-22 DIAGNOSIS — Z0001 Encounter for general adult medical examination with abnormal findings: Secondary | ICD-10-CM

## 2020-03-22 DIAGNOSIS — R300 Dysuria: Secondary | ICD-10-CM

## 2020-03-22 DIAGNOSIS — R0600 Dyspnea, unspecified: Secondary | ICD-10-CM

## 2020-03-26 ENCOUNTER — Ambulatory Visit (INDEPENDENT_AMBULATORY_CARE_PROVIDER_SITE_OTHER): Payer: Medicare HMO | Admitting: Cardiovascular Disease

## 2020-03-26 ENCOUNTER — Encounter: Payer: Self-pay | Admitting: Cardiovascular Disease

## 2020-03-26 ENCOUNTER — Other Ambulatory Visit: Payer: Self-pay

## 2020-03-26 VITALS — BP 110/58 | HR 84 | Ht 66.0 in | Wt 117.0 lb

## 2020-03-26 DIAGNOSIS — R079 Chest pain, unspecified: Secondary | ICD-10-CM

## 2020-03-26 DIAGNOSIS — I272 Pulmonary hypertension, unspecified: Secondary | ICD-10-CM | POA: Diagnosis not present

## 2020-03-26 DIAGNOSIS — E785 Hyperlipidemia, unspecified: Secondary | ICD-10-CM | POA: Diagnosis not present

## 2020-03-26 NOTE — Patient Instructions (Signed)
Medication Instructions:  Your physician recommends that you continue on your current medications as directed. Please refer to the Current Medication list given to you today.  *If you need a refill on your cardiac medications before your next appointment, please call your pharmacy*   Lab Work: Bmet and Cbc today  You will need a COVID test on 04/05/20. Please report to the [facility] medical arts pre-admit drive up test site.  Hours are Mon-Fri 8am-1pm.  If you have labs (blood work) drawn today and your tests are completely normal, you will receive your results only by: Marland Kitchen MyChart Message (if you have MyChart) OR . A paper copy in the mail If you have any lab test that is abnormal or we need to change your treatment, we will call you to review the results.   Testing/Procedures: Your physician has requested that you have a cardiac catheterization. Cardiac catheterization is used to diagnose and/or treat various heart conditions. Doctors may recommend this procedure for a number of different reasons. The most common reason is to evaluate chest pain. Chest pain can be a symptom of coronary artery disease (CAD), and cardiac catheterization can show whether plaque is narrowing or blocking your heart's arteries. This procedure is also used to evaluate the valves, as well as measure the blood flow and oxygen levels in different parts of your heart. For further information please visit [email or web address] Please follow instruction sheet, as given.     Follow-Up: At [facility], you and your health needs are our priority.  As part of our continuing mission to provide you with exceptional heart care, we have created designated Provider Care Teams.  These Care Teams include your primary Cardiologist (physician) and Advanced Practice Providers (APPs -  Physician Assistants and Nurse Practitioners) who all work together to provide you with the care you need, when you need it.  We recommend signing up  for the patient portal called "MyChart".  Sign up information is provided on this After Visit Summary.  MyChart is used to connect with patients for Virtual Visits (Telemedicine).  Patients are able to view lab/test results, encounter notes, upcoming appointments, etc.  Non-urgent messages can be sent to your provider as well.   To learn more about what you can do with MyChart, go to [email or web address].    Your next appointment:   4 week(s)  The format for your next appointment:   In Person  Provider:   You may see Dr. Kirke Corin or one of the following Advanced Practice Providers on your designated Care Team:    Nicolasa Ducking, NP  Eula Listen, PA-C  Marisue Ivan, PA-C  Cadence Fransico Michael, [place]    Other Instructions [facility] Cardiac Cath Instructions   You are scheduled for a Cardiac Cath on: Mon 04/08/20 with Dr. Kirke Corin  Please arrive at 8:30 am on the day of your procedure  Please expect a call from our [facility] Pre-Service Center to pre-register you  Do not eat/drink anything after midnight  Someone will need to drive you home  It is recommended someone be with you for the first 24 hours after your procedure  Wear clothes that are easy to get on/off and wear slip on shoes if possible   Medications bring a current list of all medications with you   __X_ Do not take these medications before your procedure: DO NOT take Metformin the morning of your procedure and 48 hours after.  You may take the rest of your medications the morning of your  procedure with enough water to swallow safely   Day of your procedure: Arrive at the [facility] entrance.  Free valet service is available.  After entering the [facility] please check-in at the registration desk (1st desk on your right) to receive your armband. After receiving your armband someone will escort you to the cardiac cath/special procedures waiting area.  The usual length of stay after your procedure is about 2 to 3  hours.  This can vary.  If you have any questions, please call our office at [phone], or you may call the cardiac cath lab at [facility] directly at [phone]

## 2020-03-26 NOTE — H&P (View-Only) (Signed)
Cardiology Office Note   Date:  03/26/2020   ID:  [name], [name] Jul 22, 1945, MRN [id number]  PCP:  Lyndon Code, MD  Cardiologist:   Lorine Bears, MD   Chief Complaint  Patient presents with  . New Patient (Initial Visit)    Ref by Dr. Beverely Risen for Coronary calcification and DOE. Meds reviewed by the pt. verbally. Pt. c/o shortness of breath with over exertion and irreg. heart beats at times.       History of Present Illness: [name] is a 74 y.o. female who was referred by Dr. Welton Flakes for evaluation of coronary calcifications, pulmonary hypertension and exertional dyspnea. She has known history of COPD with previous tobacco use.  She is currently on home oxygen.  She is also diabetic and has hyperlipidemia.  She had CT scan of the lungs this year which showed extensive COPD in addition, it showed extensive coronary artery calcifications.  This was personally reviewed by me today.  She had an echocardiogram done recently which showed normal LV systolic function, diastolic dysfunction moderate pulmonary hypertension with estimated pulmonary artery pressure of 45 mmHg.  She reports dyspnea with minimal exertion and she is not able to do much activities due to that, occasionally,  she has substernal chest tightness associated with this.  No orthopnea, PND or leg edema.  She lives by herself but does not drive.  She does have support from family and aids.   Past Medical History:  Diagnosis Date  . Anxiety   . Chest pain   . CHF (congestive heart failure) (HCC)   . COPD (chronic obstructive pulmonary disease) (HCC)   . Depression   . Diabetes mellitus without complication (HCC)   . Fibromyalgia   . Graves disease   . Hypertension   . IBS (irritable bowel syndrome)   . PTSD (post-traumatic stress disorder)   . Sinus problem   . Spinal stenosis     Past Surgical History:  Procedure Laterality Date  . ABDOMINAL HYSTERECTOMY    . ESOPHAGOGASTRODUODENOSCOPY (EGD) WITH  PROPOFOL N/A 01/10/2015   Procedure: ESOPHAGOGASTRODUODENOSCOPY (EGD) WITH PROPOFOL;  Surgeon: Wallace Cullens, MD;  Location: [facility] ENDOSCOPY;  Service: Gastroenterology;  Laterality: N/A;  . RECTAL SURGERY    . SAVORY DILATION N/A 01/10/2015   Procedure: SAVORY DILATION;  Surgeon: Wallace Cullens, MD;  Location: [facility] ENDOSCOPY;  Service: Gastroenterology;  Laterality: N/A;  . toenail removal Bilateral    great toes  . TONSILLECTOMY AND ADENOIDECTOMY       Current Outpatient Medications  Medication Sig Dispense Refill  . acetaminophen (TYLENOL) 500 MG tablet Take by mouth as needed.    Marland Kitchen albuterol (PROVENTIL HFA;VENTOLIN HFA) 108 (90 Base) MCG/ACT inhaler Inhale 2 puffs into the lungs every 6 (six) hours as needed for wheezing or shortness of breath. 1 Inhaler 2  . alendronate (FOSAMAX) 70 MG tablet Take 70 mg by mouth once a week.    Marland Kitchen aspirin 81 MG tablet Take 81 mg by mouth daily.    . Azelastine HCl 137 MCG/SPRAY SOLN daily.    Marland Kitchen azithromycin (ZITHROMAX) 250 MG tablet Take one tab a day for 10 days for uri 21 tablet 0  . Cyanocobalamin (VITAMIN B-12) 1000 MCG/15ML LIQD Take 1,200 mcg by mouth daily.    . ergocalciferol (VITAMIN D2) 1.25 MG (50000 UT) capsule Take 50,000 Units by mouth once a week.     Marland Kitchen FLUoxetine (PROZAC) 20 MG capsule Take 20 mg by mouth  2 (two) times daily.    . fluticasone (FLONASE) 50 MCG/ACT nasal spray 2 sprays daily.    Marland Kitchen guaifenesin (HUMIBID E) 400 MG TABS tablet Take by mouth as needed.    . hydrocortisone (ANUSOL-HC) 2.5 % rectal cream Place 1 application rectally 3 (three) times daily as needed for hemorrhoids or itching. Reported on 12/02/2015    . hydrocortisone 2.5 % cream Place rectally.    Marland Kitchen levothyroxine (SYNTHROID) 88 MCG tablet Take 88 mcg by mouth daily before breakfast.    . LORazepam (ATIVAN) 0.5 MG tablet Take 0.5 mg by mouth at bedtime.    . meclizine (ANTIVERT) 25 MG tablet Take 25 mg by mouth as needed for dizziness.    . metFORMIN (GLUCOPHAGE) 500 MG  tablet Take 500 mg by mouth daily with breakfast.     . Multiple Vitamin (ONE-A-DAY 55 PLUS PO) Take 1 tablet by mouth daily.    . rosuvastatin (CRESTOR) 20 MG tablet Take 1 tablet (20 mg total) by mouth daily. 90 tablet 3  . SYMBICORT 160-4.5 MCG/ACT inhaler Inhale 2 puffs into the lungs 2 (two) times daily.    Marland Kitchen tiotropium (SPIRIVA) 18 MCG inhalation capsule Place 18 mcg into inhaler and inhale daily. Reported on 08/07/2015    . ipratropium-albuterol (DUONEB) 0.5-2.5 (3) MG/3ML SOLN One vial 3 x  aday for copd with hypoxia (Patient not taking: Reported on 03/26/2020) 180 mL 1   No current facility-administered medications for this visit.    Allergies:   Celecoxib and Pregabalin    Social History:  The patient  reports that she quit smoking about 3 years ago. Her smoking use included cigarettes. She has a 12.50 pack-year smoking history. She has never used smokeless tobacco. She reports that she does not drink alcohol and does not use drugs.   Family History:  The patient's family history includes Alcohol abuse in her father, mother, and sister; Asthma in her sister; COPD in her sister; Depression in her father, maternal grandmother, mother, and sister; Diabetes in her paternal grandmother and sister; Heart disease in her sister; Hypertension in her sister; Stroke in her paternal grandmother; Varicose Veins in her mother.    ROS:  Please see the history of present illness.   Otherwise, review of systems are positive for none.   All other systems are reviewed and negative.    PHYSICAL EXAM: VS:  BP (!) 110/58 (BP Location: Right Arm, Patient Position: Sitting, Cuff Size: Normal)   Pulse 84   Ht 5\' 6"  (1.676 m)   Wt 117 lb (53.1 kg)   SpO2 98% Comment: 2 liters of oxygen  BMI 18.88 kg/m  , BMI Body mass index is 18.88 kg/m. GEN: Well nourished, well developed, in no acute distress  HEENT: normal  Neck: no JVD, carotid bruits, or masses Cardiac: RRR; no murmurs, rubs, or gallops,no edema   Respiratory: Diminished breath sounds bilaterally, normal work of breathing GI: soft, nontender, nondistended, + BS MS: no deformity or atrophy  Skin: warm and dry, no rash Neuro:  Strength and sensation are intact Psych: euthymic mood, full affect   EKG:  EKG is ordered today. The ekg ordered today demonstrates normal sinus rhythm with possible left atrial enlargement.   Recent Labs: 04/04/2019: Magnesium 2.1 04/06/2019: ALT 12; BUN 16; Creatinine, Ser 0.72; Hemoglobin 11.8; Platelets 186; Potassium 3.1; Sodium 136; TSH 5.380    Lipid Panel No results found for: CHOL, TRIG, HDL, CHOLHDL, VLDL, LDLCALC, LDLDIRECT    Wt Readings from Last  3 Encounters:  03/26/20 117 lb (53.1 kg)  03/12/20 120 lb 3.2 oz (54.5 kg)  02/27/20 114 lb 12.8 oz (52.1 kg)        PAD Screen 03/26/2020  Previous PAD dx? No  Previous surgical procedure? No  Pain with walking? No  Feet/toe relief with dangling? No  Painful, non-healing ulcers? No  Extremities discolored? No      ASSESSMENT AND PLAN:  1.  Severe exertional dyspnea: Certainly, this could be due to underlying lung disease and COPD.  However, previous CT scan of the lungs showed extensive coronary artery calcifications and she has multiple risk factors for coronary artery disease.  In addition, recent echocardiogram showed moderate pulmonary hypertension.  Due to that, I recommend evaluation with a right and left cardiac catheterization possible PCI.  I discussed the procedure in details as well as risk and benefits and she is agreeable to proceed.  2.  Hyperlipidemia: I agree with the addition of rosuvastatin given extensive coronary artery calcifications.  3.  COPD: Currently on home oxygen and seems to be stable overall.    Disposition:   FU with me in 1 month  Signed,  Lorine Bears, MD  03/26/2020 2:02 PM    [facility]

## 2020-03-26 NOTE — Progress Notes (Signed)
Cardiology Office Note   Date:  03/26/2020   ID:  [name], [name] Jul 22, 1945, MRN [id number]  PCP:  Lyndon Code, MD  Cardiologist:   Lorine Bears, MD   Chief Complaint  Patient presents with  . New Patient (Initial Visit)    Ref by Dr. Beverely Risen for Coronary calcification and DOE. Meds reviewed by the pt. verbally. Pt. c/o shortness of breath with over exertion and irreg. heart beats at times.       History of Present Illness: [name] is a 74 y.o. female who was referred by Dr. Welton Flakes for evaluation of coronary calcifications, pulmonary hypertension and exertional dyspnea. She has known history of COPD with previous tobacco use.  She is currently on home oxygen.  She is also diabetic and has hyperlipidemia.  She had CT scan of the lungs this year which showed extensive COPD in addition, it showed extensive coronary artery calcifications.  This was personally reviewed by me today.  She had an echocardiogram done recently which showed normal LV systolic function, diastolic dysfunction moderate pulmonary hypertension with estimated pulmonary artery pressure of 45 mmHg.  She reports dyspnea with minimal exertion and she is not able to do much activities due to that, occasionally,  she has substernal chest tightness associated with this.  No orthopnea, PND or leg edema.  She lives by herself but does not drive.  She does have support from family and aids.   Past Medical History:  Diagnosis Date  . Anxiety   . Chest pain   . CHF (congestive heart failure) (HCC)   . COPD (chronic obstructive pulmonary disease) (HCC)   . Depression   . Diabetes mellitus without complication (HCC)   . Fibromyalgia   . Graves disease   . Hypertension   . IBS (irritable bowel syndrome)   . PTSD (post-traumatic stress disorder)   . Sinus problem   . Spinal stenosis     Past Surgical History:  Procedure Laterality Date  . ABDOMINAL HYSTERECTOMY    . ESOPHAGOGASTRODUODENOSCOPY (EGD) WITH  PROPOFOL N/A 01/10/2015   Procedure: ESOPHAGOGASTRODUODENOSCOPY (EGD) WITH PROPOFOL;  Surgeon: Wallace Cullens, MD;  Location: [facility] ENDOSCOPY;  Service: Gastroenterology;  Laterality: N/A;  . RECTAL SURGERY    . SAVORY DILATION N/A 01/10/2015   Procedure: SAVORY DILATION;  Surgeon: Wallace Cullens, MD;  Location: [facility] ENDOSCOPY;  Service: Gastroenterology;  Laterality: N/A;  . toenail removal Bilateral    great toes  . TONSILLECTOMY AND ADENOIDECTOMY       Current Outpatient Medications  Medication Sig Dispense Refill  . acetaminophen (TYLENOL) 500 MG tablet Take by mouth as needed.    Marland Kitchen albuterol (PROVENTIL HFA;VENTOLIN HFA) 108 (90 Base) MCG/ACT inhaler Inhale 2 puffs into the lungs every 6 (six) hours as needed for wheezing or shortness of breath. 1 Inhaler 2  . alendronate (FOSAMAX) 70 MG tablet Take 70 mg by mouth once a week.    Marland Kitchen aspirin 81 MG tablet Take 81 mg by mouth daily.    . Azelastine HCl 137 MCG/SPRAY SOLN daily.    Marland Kitchen azithromycin (ZITHROMAX) 250 MG tablet Take one tab a day for 10 days for uri 21 tablet 0  . Cyanocobalamin (VITAMIN B-12) 1000 MCG/15ML LIQD Take 1,200 mcg by mouth daily.    . ergocalciferol (VITAMIN D2) 1.25 MG (50000 UT) capsule Take 50,000 Units by mouth once a week.     Marland Kitchen FLUoxetine (PROZAC) 20 MG capsule Take 20 mg by mouth  2 (two) times daily.    . fluticasone (FLONASE) 50 MCG/ACT nasal spray 2 sprays daily.    Marland Kitchen guaifenesin (HUMIBID E) 400 MG TABS tablet Take by mouth as needed.    . hydrocortisone (ANUSOL-HC) 2.5 % rectal cream Place 1 application rectally 3 (three) times daily as needed for hemorrhoids or itching. Reported on 12/02/2015    . hydrocortisone 2.5 % cream Place rectally.    Marland Kitchen levothyroxine (SYNTHROID) 88 MCG tablet Take 88 mcg by mouth daily before breakfast.    . LORazepam (ATIVAN) 0.5 MG tablet Take 0.5 mg by mouth at bedtime.    . meclizine (ANTIVERT) 25 MG tablet Take 25 mg by mouth as needed for dizziness.    . metFORMIN (GLUCOPHAGE) 500 MG  tablet Take 500 mg by mouth daily with breakfast.     . Multiple Vitamin (ONE-A-DAY 55 PLUS PO) Take 1 tablet by mouth daily.    . rosuvastatin (CRESTOR) 20 MG tablet Take 1 tablet (20 mg total) by mouth daily. 90 tablet 3  . SYMBICORT 160-4.5 MCG/ACT inhaler Inhale 2 puffs into the lungs 2 (two) times daily.    Marland Kitchen tiotropium (SPIRIVA) 18 MCG inhalation capsule Place 18 mcg into inhaler and inhale daily. Reported on 08/07/2015    . ipratropium-albuterol (DUONEB) 0.5-2.5 (3) MG/3ML SOLN One vial 3 x  aday for copd with hypoxia (Patient not taking: Reported on 03/26/2020) 180 mL 1   No current facility-administered medications for this visit.    Allergies:   Celecoxib and Pregabalin    Social History:  The patient  reports that she quit smoking about 3 years ago. Her smoking use included cigarettes. She has a 12.50 pack-year smoking history. She has never used smokeless tobacco. She reports that she does not drink alcohol and does not use drugs.   Family History:  The patient's family history includes Alcohol abuse in her father, mother, and sister; Asthma in her sister; COPD in her sister; Depression in her father, maternal grandmother, mother, and sister; Diabetes in her paternal grandmother and sister; Heart disease in her sister; Hypertension in her sister; Stroke in her paternal grandmother; Varicose Veins in her mother.    ROS:  Please see the history of present illness.   Otherwise, review of systems are positive for none.   All other systems are reviewed and negative.    PHYSICAL EXAM: VS:  BP (!) 110/58 (BP Location: Right Arm, Patient Position: Sitting, Cuff Size: Normal)   Pulse 84   Ht 5\' 6"  (1.676 m)   Wt 117 lb (53.1 kg)   SpO2 98% Comment: 2 liters of oxygen  BMI 18.88 kg/m  , BMI Body mass index is 18.88 kg/m. GEN: Well nourished, well developed, in no acute distress  HEENT: normal  Neck: no JVD, carotid bruits, or masses Cardiac: RRR; no murmurs, rubs, or gallops,no edema   Respiratory: Diminished breath sounds bilaterally, normal work of breathing GI: soft, nontender, nondistended, + BS MS: no deformity or atrophy  Skin: warm and dry, no rash Neuro:  Strength and sensation are intact Psych: euthymic mood, full affect   EKG:  EKG is ordered today. The ekg ordered today demonstrates normal sinus rhythm with possible left atrial enlargement.   Recent Labs: 04/04/2019: Magnesium 2.1 04/06/2019: ALT 12; BUN 16; Creatinine, Ser 0.72; Hemoglobin 11.8; Platelets 186; Potassium 3.1; Sodium 136; TSH 5.380    Lipid Panel No results found for: CHOL, TRIG, HDL, CHOLHDL, VLDL, LDLCALC, LDLDIRECT    Wt Readings from Last  3 Encounters:  03/26/20 117 lb (53.1 kg)  03/12/20 120 lb 3.2 oz (54.5 kg)  02/27/20 114 lb 12.8 oz (52.1 kg)        PAD Screen 03/26/2020  Previous PAD dx? No  Previous surgical procedure? No  Pain with walking? No  Feet/toe relief with dangling? No  Painful, non-healing ulcers? No  Extremities discolored? No      ASSESSMENT AND PLAN:  1.  Severe exertional dyspnea: Certainly, this could be due to underlying lung disease and COPD.  However, previous CT scan of the lungs showed extensive coronary artery calcifications and she has multiple risk factors for coronary artery disease.  In addition, recent echocardiogram showed moderate pulmonary hypertension.  Due to that, I recommend evaluation with a right and left cardiac catheterization possible PCI.  I discussed the procedure in details as well as risk and benefits and she is agreeable to proceed.  2.  Hyperlipidemia: I agree with the addition of rosuvastatin given extensive coronary artery calcifications.  3.  COPD: Currently on home oxygen and seems to be stable overall.    Disposition:   FU with me in 1 month  Signed,  Lorine Bears, MD  03/26/2020 2:02 PM    [facility]

## 2020-03-27 LAB — BASIC METABOLIC PANEL
BUN/Creatinine Ratio: 26 (ref 12–28)
BUN: 20 mg/dL (ref 8–27)
CO2: 26 mmol/L (ref 20–29)
Calcium: 9.4 mg/dL (ref 8.7–10.3)
Chloride: 97 mmol/L (ref 96–106)
Creatinine, Ser: 0.76 mg/dL (ref 0.57–1.00)
GFR calc Af Amer: 89 mL/min/{1.73_m2} (ref >59)
GFR calc non Af Amer: 77 mL/min/{1.73_m2} (ref >59)
Glucose: 125 mg/dL — ABNORMAL HIGH (ref 65–99)
Potassium: 4.4 mmol/L (ref 3.5–5.2)
Sodium: 137 mmol/L (ref 134–144)

## 2020-03-27 LAB — CBC WITH DIFFERENTIAL/PLATELET
Basophils Absolute: 0 10*3/uL (ref 0.0–0.2)
Basos: 1 %
EOS (ABSOLUTE): 0.1 10*3/uL (ref 0.0–0.4)
Eos: 2 %
Hematocrit: 41.4 % (ref 34.0–46.6)
Hemoglobin: 13.7 g/dL (ref 11.1–15.9)
Immature Grans (Abs): 0 10*3/uL (ref 0.0–0.1)
Immature Granulocytes: 0 %
Lymphocytes Absolute: 1.2 10*3/uL (ref 0.7–3.1)
Lymphs: 23 %
MCH: 32.1 pg (ref 26.6–33.0)
MCHC: 33.1 g/dL (ref 31.5–35.7)
MCV: 97 fL (ref 79–97)
Monocytes Absolute: 0.6 10*3/uL (ref 0.1–0.9)
Monocytes: 11 %
Neutrophils Absolute: 3.4 10*3/uL (ref 1.4–7.0)
Neutrophils: 63 %
Platelets: 220 10*3/uL (ref 150–450)
RBC: 4.27 x10E6/uL (ref 3.77–5.28)
RDW: 12 % (ref 11.7–15.4)
WBC: 5.4 10*3/uL (ref 3.4–10.8)

## 2020-03-28 ENCOUNTER — Ambulatory Visit: Payer: Medicaid Other | Admitting: Internal Medicine

## 2020-03-29 ENCOUNTER — Other Ambulatory Visit: Payer: Self-pay

## 2020-03-29 ENCOUNTER — Telehealth: Payer: Self-pay

## 2020-03-29 NOTE — Telephone Encounter (Signed)
Pt called that she need neb med send to [facility] with taylor sig for duoneb and also gave kevin from [facility] a order

## 2020-04-01 ENCOUNTER — Ambulatory Visit: Payer: Medicare Other | Admitting: Podiatry

## 2020-04-05 ENCOUNTER — Other Ambulatory Visit
Admission: RE | Admit: 2020-04-05 | Discharge: 2020-04-05 | Disposition: A | Discharge status: Home / Self Care | Payer: Medicare HMO | Source: Ambulatory Visit | Attending: Cardiovascular Disease | Admitting: Cardiovascular Disease

## 2020-04-05 ENCOUNTER — Other Ambulatory Visit: Payer: Self-pay

## 2020-04-05 DIAGNOSIS — Z20822 Contact with and (suspected) exposure to covid-19: Secondary | ICD-10-CM | POA: Diagnosis not present

## 2020-04-05 DIAGNOSIS — Z01812 Encounter for preprocedural laboratory examination: Secondary | ICD-10-CM | POA: Insufficient documentation

## 2020-04-05 LAB — SARS CORONAVIRUS 2 (TAT 6-24 HRS): SARS Coronavirus 2: NEGATIVE

## 2020-04-05 MED ORDER — ALBUTEROL SULFATE HFA 108 (90 BASE) MCG/ACT IN AERS: 2.0000 | INHALATION_SPRAY | Freq: Four times a day (QID) | RESPIRATORY_TRACT | 0 refills | Status: DC | PRN

## 2020-04-06 ENCOUNTER — Telehealth: Payer: Self-pay | Admitting: Cardiology

## 2020-04-06 NOTE — Telephone Encounter (Signed)
Patient paged answering service this AM stating that her insurance has denied the cardiac catheterization claim. She is to be at the hospital at 0830 AM for [facility] with Dr. Kirke Corin at [facility] given progressive SOB, extensive coronary calcifications on recent CT. I will plan to have her still come for her procedure however will send a message to the [facility] office triage for prompt review of pre-cath information. Otherwise patient is stable.   Georgie Chard NP-C [facility] Pager: [phone]

## 2020-04-08 ENCOUNTER — Ambulatory Visit
Admission: RE | Admit: 2020-04-08 | Discharge: 2020-04-08 | Disposition: A | Discharge status: Home / Self Care | Payer: Medicare HMO | Attending: Cardiovascular Disease | Admitting: Cardiovascular Disease

## 2020-04-08 ENCOUNTER — Other Ambulatory Visit: Payer: Self-pay

## 2020-04-08 ENCOUNTER — Encounter
Admission: RE | Disposition: A | Discharge status: Home / Self Care | Payer: Self-pay | Source: Home / Self Care | Attending: Cardiovascular Disease

## 2020-04-08 ENCOUNTER — Encounter: Payer: Self-pay | Admitting: Cardiovascular Disease

## 2020-04-08 DIAGNOSIS — Z7984 Long term (current) use of oral hypoglycemic drugs: Secondary | ICD-10-CM | POA: Diagnosis not present

## 2020-04-08 DIAGNOSIS — Z7982 Long term (current) use of aspirin: Secondary | ICD-10-CM | POA: Insufficient documentation

## 2020-04-08 DIAGNOSIS — Z79899 Other long term (current) drug therapy: Secondary | ICD-10-CM | POA: Diagnosis not present

## 2020-04-08 DIAGNOSIS — I25119 Atherosclerotic heart disease of native coronary artery with unspecified angina pectoris: Secondary | ICD-10-CM | POA: Diagnosis not present

## 2020-04-08 DIAGNOSIS — I209 Angina pectoris, unspecified: Secondary | ICD-10-CM | POA: Diagnosis not present

## 2020-04-08 DIAGNOSIS — J449 Chronic obstructive pulmonary disease, unspecified: Secondary | ICD-10-CM | POA: Insufficient documentation

## 2020-04-08 DIAGNOSIS — Z87891 Personal history of nicotine dependence: Secondary | ICD-10-CM | POA: Insufficient documentation

## 2020-04-08 DIAGNOSIS — I272 Pulmonary hypertension, unspecified: Secondary | ICD-10-CM

## 2020-04-08 DIAGNOSIS — R079 Chest pain, unspecified: Secondary | ICD-10-CM

## 2020-04-08 DIAGNOSIS — E119 Type 2 diabetes mellitus without complications: Secondary | ICD-10-CM | POA: Insufficient documentation

## 2020-04-08 DIAGNOSIS — E785 Hyperlipidemia, unspecified: Secondary | ICD-10-CM | POA: Diagnosis not present

## 2020-04-08 DIAGNOSIS — Z9981 Dependence on supplemental oxygen: Secondary | ICD-10-CM | POA: Insufficient documentation

## 2020-04-08 HISTORY — PX: RIGHT/LEFT HEART CATH AND CORONARY ANGIOGRAPHY: CATH118266

## 2020-04-08 LAB — GLUCOSE, CAPILLARY: Glucose-Capillary: 89 mg/dL (ref 70–99)

## 2020-04-08 SURGERY — RIGHT/LEFT HEART CATH AND CORONARY ANGIOGRAPHY
Anesthesia: Moderate Sedation | Laterality: Bilateral

## 2020-04-08 MED ORDER — HEPARIN (PORCINE) IN NACL 1000-0.9 UT/500ML-% IV SOLN
INTRAVENOUS | Status: AC
  Filled 2020-04-08: qty 1000

## 2020-04-08 MED ORDER — SODIUM CHLORIDE 0.9 % IV SOLN: 250.0000 mL | INTRAVENOUS | Status: DC | PRN

## 2020-04-08 MED ORDER — SODIUM CHLORIDE 0.9 % WEIGHT BASED INFUSION: 1.0000 mL/kg/h | INTRAVENOUS | Status: DC

## 2020-04-08 MED ORDER — ACETAMINOPHEN 325 MG PO TABS: 650.0000 mg | ORAL_TABLET | ORAL | Status: DC | PRN

## 2020-04-08 MED ORDER — SODIUM CHLORIDE 0.9 % WEIGHT BASED INFUSION
3.0000 mL/kg/h | INTRAVENOUS | Status: AC
  Administered 2020-04-08: 3 mL/kg/h via INTRAVENOUS

## 2020-04-08 MED ORDER — SODIUM CHLORIDE 0.9 % IV SOLN: INTRAVENOUS | Status: DC

## 2020-04-08 MED ORDER — ASPIRIN 81 MG PO CHEW: 81.0000 mg | CHEWABLE_TABLET | ORAL | Status: DC

## 2020-04-08 MED ORDER — FENTANYL CITRATE (PF) 100 MCG/2ML IJ SOLN
INTRAMUSCULAR | Status: AC
  Filled 2020-04-08: qty 2

## 2020-04-08 MED ORDER — HEPARIN SODIUM (PORCINE) 1000 UNIT/ML IJ SOLN
INTRAMUSCULAR | Status: DC | PRN
  Administered 2020-04-08: 3000 [IU] via INTRAVENOUS

## 2020-04-08 MED ORDER — MIDAZOLAM HCL 2 MG/2ML IJ SOLN
INTRAMUSCULAR | Status: AC
  Filled 2020-04-08: qty 2

## 2020-04-08 MED ORDER — ONDANSETRON HCL 4 MG/2ML IJ SOLN: 4.0000 mg | Freq: Four times a day (QID) | INTRAMUSCULAR | Status: DC | PRN

## 2020-04-08 MED ORDER — SODIUM CHLORIDE 0.9% FLUSH: 3.0000 mL | Freq: Two times a day (BID) | INTRAVENOUS | Status: DC

## 2020-04-08 MED ORDER — VERAPAMIL HCL 2.5 MG/ML IV SOLN
INTRAVENOUS | Status: AC
  Filled 2020-04-08: qty 2

## 2020-04-08 MED ORDER — LIDOCAINE HCL (PF) 1 % IJ SOLN
INTRAMUSCULAR | Status: AC
  Filled 2020-04-08: qty 30

## 2020-04-08 MED ORDER — SODIUM CHLORIDE 0.9% FLUSH: 3.0000 mL | INTRAVENOUS | Status: DC | PRN

## 2020-04-08 MED ORDER — IOHEXOL 300 MG/ML  SOLN
INTRAMUSCULAR | Status: DC | PRN
  Administered 2020-04-08: 75 mL

## 2020-04-08 MED ORDER — HEPARIN SODIUM (PORCINE) 1000 UNIT/ML IJ SOLN
INTRAMUSCULAR | Status: AC
  Filled 2020-04-08: qty 1

## 2020-04-08 MED ORDER — FENTANYL CITRATE (PF) 100 MCG/2ML IJ SOLN
INTRAMUSCULAR | Status: DC | PRN
  Administered 2020-04-08: 25 ug via INTRAVENOUS

## 2020-04-08 MED ORDER — HEPARIN (PORCINE) IN NACL 1000-0.9 UT/500ML-% IV SOLN
INTRAVENOUS | Status: DC | PRN
  Administered 2020-04-08: 500 mL

## 2020-04-08 MED ORDER — MIDAZOLAM HCL 2 MG/2ML IJ SOLN
INTRAMUSCULAR | Status: DC | PRN
  Administered 2020-04-08: 1 mg via INTRAVENOUS

## 2020-04-08 SURGICAL SUPPLY — 11 items
CATH BALLN WEDGE 5F 110CM (CATHETERS) ×2 IMPLANT
CATH INFINITI 5FR ANG PIGTAIL (CATHETERS) ×2 IMPLANT
CATH INFINITI 5FR JK (CATHETERS) ×2 IMPLANT
DEVICE RAD TR BAND REGULAR (VASCULAR PRODUCTS) ×2 IMPLANT
GLIDESHEATH SLEND SS 6F .021 (SHEATH) IMPLANT
GUIDEWIRE .025 260CM (WIRE) ×2 IMPLANT
GUIDEWIRE INQWIRE 1.5J.035X260 (WIRE) ×1 IMPLANT
INQWIRE 1.5J .035X260CM (WIRE) ×2
KIT MANI 3VAL PERCEP (MISCELLANEOUS) ×2 IMPLANT
PACK CARDIAC CATH (CUSTOM PROCEDURE TRAY) ×2 IMPLANT
SHEATH GLIDE SLENDER 4/5FR (SHEATH) ×4 IMPLANT

## 2020-04-08 NOTE — Interval H&P Note (Signed)
History and Physical Interval Note:  04/08/2020 10:33 AM  [name]  has presented today for surgery, with the diagnosis of RT and LT Cath   Pulmonary hypertension.  The various methods of treatment have been discussed with the patient and family. After consideration of risks, benefits and other options for treatment, the patient has consented to  Procedure(s): RIGHT/LEFT HEART CATH AND CORONARY ANGIOGRAPHY (Bilateral) as a surgical intervention.  The patient's history has been reviewed, patient examined, no change in status, stable for surgery.  I have reviewed the patient's chart and labs.  Questions were answered to the patient's satisfaction.     Lorine Bears

## 2020-04-08 NOTE — Progress Notes (Signed)
Called friend Glory Buff for pt. Ride home and also called Personal care Aide: Janice Coffin. Patsy will stay the night with pt.. DC and right wrist instructions reviewed with Patsy: she verbalized understanding & will also read printed instructions.

## 2020-04-10 ENCOUNTER — Other Ambulatory Visit: Payer: Medicare HMO

## 2020-04-18 ENCOUNTER — Telehealth: Payer: Self-pay

## 2020-04-18 NOTE — Telephone Encounter (Signed)
Confirmation of verbal order for medical home equipment signed and placed in [facility] folder.

## 2020-04-22 NOTE — Progress Notes (Unsigned)
Cardiology Office Note:    Date:  04/23/2020   ID:  [name], [name] Oct 09, 1945, MRN [id number]  PCP:  Lyndon Code, MD  [facility] Cardiologist:  No primary care provider on file.  [facility] Electrophysiologist:  None   Referring MD: Lyndon Code, MD   Chief Complaint: F/U for cath  History of Present Illness:    [name] is a 74 y.o. female with a hx of coronary artery calcifications, pulmonary hypertension, COPD and previous tobacco use, DM2, HLD. She had a CT chest showing coronary artery calcifications. Echo showed normal EF with diastolic dysfunction and pulmonary hypertension. She was set up for cath that showed mildly calcified coronary arteries with mild nonobstructive disease, EF 70%. RHC cath showed mildly elevated filling pressures, mild pulmonary hypertension and normal cardiac output.   Today  Nonobstructive CAD 20% lesion in prox cx and mid cx. Continue medical therapy with aspirin, statin, BB  Dyspnea on exertion combination of HFpEF, COPD and pulmonary hypertension. RHC showed mildly elevated filing pressures and mild pulmonary hypertension. Not severe enough for vasodilators. Will  HLD  COPD Stable on home O2.    Past Medical History:  Diagnosis Date  . Anxiety   . Chest pain   . CHF (congestive heart failure) (HCC)   . COPD (chronic obstructive pulmonary disease) (HCC)   . Depression   . Diabetes mellitus without complication (HCC)   . Fibromyalgia   . Graves disease   . Hypertension   . IBS (irritable bowel syndrome)   . PTSD (post-traumatic stress disorder)   . Sinus problem   . Spinal stenosis     Past Surgical History:  Procedure Laterality Date  . ABDOMINAL HYSTERECTOMY    . ESOPHAGOGASTRODUODENOSCOPY (EGD) WITH PROPOFOL N/A 01/10/2015   Procedure: ESOPHAGOGASTRODUODENOSCOPY (EGD) WITH PROPOFOL;  Surgeon: Wallace Cullens, MD;  Location: [facility] ENDOSCOPY;  Service: Gastroenterology;  Laterality: N/A;  . RECTAL SURGERY    .  RIGHT/LEFT HEART CATH AND CORONARY ANGIOGRAPHY Bilateral 04/08/2020   Procedure: RIGHT/LEFT HEART CATH AND CORONARY ANGIOGRAPHY;  Surgeon: Iran Ouch, MD;  Location: [facility] INVASIVE CV LAB;  Service: Cardiovascular;  Laterality: Bilateral;  . SAVORY DILATION N/A 01/10/2015   Procedure: SAVORY DILATION;  Surgeon: Wallace Cullens, MD;  Location: [facility] ENDOSCOPY;  Service: Gastroenterology;  Laterality: N/A;  . toenail removal Bilateral    great toes  . TONSILLECTOMY AND ADENOIDECTOMY      Current Medications: No outpatient medications have been marked as taking for the 04/23/20 encounter (Appointment) with Fransico Michael, Cadence H, PA-C.     Allergies:   Celecoxib and Pregabalin   Social History   Socioeconomic History  . Marital status: Widowed    Spouse name: Not on file  . Number of children: 2  . Years of education: Not on file  . Highest education level: Not on file  Occupational History  . Not on file  Tobacco Use  . Smoking status: Former Smoker    Packs/day: 0.50    Years: 25.00    Pack years: 12.50    Types: Cigarettes    Quit date: 06/03/2016    Years since quitting: 3.8  . Smokeless tobacco: Never Used  Vaping Use  . Vaping Use: Never used  Substance and Sexual Activity  . Alcohol use: No    Alcohol/week: 0.0 standard drinks  . Drug use: No  . Sexual activity: Not Currently  Other Topics Concern  . Not on file  Social History Narrative  Lives by herself; 1 daughter [name] in [place]: 1 daughter in [place]    Social Determinants of Health   Financial Resource Strain: Low Risk   . Difficulty of Paying Living Expenses: Not hard at all  Food Insecurity: No Food Insecurity  . Worried About Programme researcher, broadcasting/film/video in the Last Year: Never true  . Ran Out of Food in the Last Year: Never true  Transportation Needs: No Transportation Needs  . Lack of Transportation (Medical): No  . Lack of Transportation (Non-Medical): No  Physical Activity:   . Days of Exercise per  Week: Not on file  . Minutes of Exercise per Session: Not on file  Stress:   . Feeling of Stress : Not on file  Social Connections:   . Frequency of Communication with Friends and Family: Not on file  . Frequency of Social Gatherings with Friends and Family: Not on file  . Attends Religious Services: Not on file  . Active Member of Clubs or Organizations: Not on file  . Attends Banker Meetings: Not on file  . Marital Status: Not on file     Family History: The patient's ***family history includes Alcohol abuse in her father, mother, and sister; Asthma in her sister; COPD in her sister; Depression in her father, maternal grandmother, mother, and sister; Diabetes in her paternal grandmother and sister; Heart disease in her sister; Hypertension in her sister; Stroke in her paternal grandmother; Varicose Veins in her mother.  ROS:   Please see the history of present illness.    *** All other systems reviewed and are negative.  EKGs/Labs/Other Studies Reviewed:    The following studies were reviewed today: ***  EKG:  EKG is *** ordered today.  The ekg ordered today demonstrates ***  Recent Labs: 03/26/2020: BUN 20; Creatinine, Ser 0.76; Hemoglobin 13.7; Platelets 220; Potassium 4.4; Sodium 137  Recent Lipid Panel No results found for: CHOL, TRIG, HDL, CHOLHDL, VLDL, LDLCALC, LDLDIRECT   Risk Assessment/Calculations:   {Does this patient have ATRIAL FIBRILLATION?:[phone]}   Physical Exam:    VS:  There were no vitals taken for this visit.    Wt Readings from Last 3 Encounters:  04/23/20 122 lb 9.6 oz (55.6 kg)  04/08/20 117 lb 1 oz (53.1 kg)  03/26/20 117 lb (53.1 kg)     GEN: *** Well nourished, well developed in no acute distress HEENT: Normal NECK: No JVD; No carotid bruits LYMPHATICS: No lymphadenopathy CARDIAC: ***RRR, no murmurs, rubs, gallops RESPIRATORY:  Clear to auscultation without rales, wheezing or rhonchi  ABDOMEN: Soft, non-tender,  non-distended MUSCULOSKELETAL:  No edema; No deformity  SKIN: Warm and dry NEUROLOGIC:  Alert and oriented x 3 PSYCHIATRIC:  Normal affect   ASSESSMENT:    1. Pulmonary emphysema, unspecified emphysema type (HCC)   2. Dyspnea on exertion   3. Hyperlipidemia, mixed   4. Coronary artery disease involving native coronary artery of native heart without angina pectoris    PLAN:    In order of problems listed above:  1. ***    Shared Decision Making/Informed Consent   {Are you ordering a CV Procedure (e.g. stress test, cath, DCCV, TEE, etc)?   Press F2        :[id number]}    Signed, Cadence David Stall, PA-C  04/23/2020 1:40 PM     [facility]

## 2020-04-23 ENCOUNTER — Ambulatory Visit (INDEPENDENT_AMBULATORY_CARE_PROVIDER_SITE_OTHER): Payer: Medicaid Other | Admitting: Internal Medicine

## 2020-04-23 ENCOUNTER — Encounter: Payer: Self-pay | Admitting: Internal Medicine

## 2020-04-23 ENCOUNTER — Other Ambulatory Visit: Payer: Self-pay

## 2020-04-23 ENCOUNTER — Ambulatory Visit: Payer: Medicare HMO | Admitting: Medical

## 2020-04-23 DIAGNOSIS — R7303 Prediabetes: Secondary | ICD-10-CM | POA: Diagnosis not present

## 2020-04-23 DIAGNOSIS — J449 Chronic obstructive pulmonary disease, unspecified: Secondary | ICD-10-CM

## 2020-04-23 DIAGNOSIS — R0600 Dyspnea, unspecified: Secondary | ICD-10-CM

## 2020-04-23 DIAGNOSIS — F3340 Major depressive disorder, recurrent, in remission, unspecified: Secondary | ICD-10-CM | POA: Diagnosis not present

## 2020-04-23 DIAGNOSIS — E782 Mixed hyperlipidemia: Secondary | ICD-10-CM

## 2020-04-23 DIAGNOSIS — I6521 Occlusion and stenosis of right carotid artery: Secondary | ICD-10-CM

## 2020-04-23 DIAGNOSIS — I2510 Atherosclerotic heart disease of native coronary artery without angina pectoris: Secondary | ICD-10-CM

## 2020-04-23 DIAGNOSIS — J439 Emphysema, unspecified: Secondary | ICD-10-CM

## 2020-04-23 DIAGNOSIS — R0902 Hypoxemia: Secondary | ICD-10-CM

## 2020-04-23 NOTE — Progress Notes (Signed)
[facility] [address] [place], [place] [address]  Internal MEDICINE  Office Visit Note  Patient Name: Danielle KaysBetty C Poole  [id number]  [id number]  Date of Service: 04/23/2020  Chief Complaint  Patient presents with  . Follow-up    FL2 paperwork, review procedure in hospital  . Anxiety  . Depression  . Diabetes  . Hypertension  . COPD  . policy update form    received  . Quality Metric Gaps    colonoscopy, dexa, pna, flu    HPI Pt is here for routine follow up. 1. Just has Right and Left heart cath, showed moderate CAD.  Pt is on 2L N/C for COPD. Scheduled to do carotid dopplers. She is [facility] to get [facility] aide. Continues to have sob, activity is limited. She id off of metformin.  2. Pt also needs monitoring of her carotid artery occlusion, she believes she was told she has 60% stenosis on right side. 3.Losartan/hctz was stopped due to low BP  4. She sees her psychiatrists for mental health  5. Diabetes is diet controlled  6. She was told that she might have osteoporosis. Takes Fosamax, she also has hypothyroidism and HLD Current Medication: Outpatient Encounter Medications as of 04/23/2020  Medication Sig Note  . acetaminophen (TYLENOL) 500 MG tablet Take 1,000 mg by mouth in the morning and at bedtime.    Marland Kitchen. albuterol (VENTOLIN HFA) 108 (90 Base) MCG/ACT inhaler Inhale 2 puffs into the lungs every 6 (six) hours as needed for wheezing or shortness of breath.   Marland Kitchen. alendronate (FOSAMAX) 70 MG tablet Take 70 mg by mouth every Tuesday.    Marland Kitchen. aspirin EC 81 MG tablet Take 81 mg by mouth daily. Swallow whole.   . Azelastine HCl 137 MCG/SPRAY SOLN Place 2 sprays into both nostrils in the morning and at bedtime.    . Cyanocobalamin (VITAMIN B-12) 1000 MCG/15ML LIQD Take 1,200 mcg by mouth daily.   Marland Kitchen. FLUoxetine (PROZAC) 20 MG capsule Take 40 mg by mouth daily.    . fluticasone (FLONASE) 50 MCG/ACT nasal spray Place 2 sprays into both  nostrils in the morning and at bedtime.    Marland Kitchen. guaifenesin (HUMIBID E) 400 MG TABS tablet Take 800 mg by mouth every 6 (six) hours as needed (congestion/cough).    . hydrocortisone 2.5 % cream Place 1 application rectally 2 (two) times daily as needed (itching/hemorroids).    Marland Kitchen. ipratropium-albuterol (DUONEB) 0.5-2.5 (3) MG/3ML SOLN One vial 3 x  aday for copd with hypoxia (Patient taking differently: Inhale 3 mLs into the lungs in the morning, at noon, and at bedtime. )   . levothyroxine (SYNTHROID) 88 MCG tablet Take 88 mcg by mouth daily before breakfast.   . LORazepam (ATIVAN) 0.5 MG tablet Take 0.5 mg by mouth at bedtime.   . Multiple Vitamin (MULTIVITAMIN WITH MINERALS) TABS tablet Take 1 tablet by mouth daily. Multivitamin for Seniors   . rosuvastatin (CRESTOR) 20 MG tablet Take 1 tablet (20 mg total) by mouth daily.   . SYMBICORT 160-4.5 MCG/ACT inhaler Inhale 2 puffs into the lungs 2 (two) times daily.   Marland Kitchen. tiotropium (SPIRIVA) 18 MCG inhalation capsule Place 18 mcg into inhaler and inhale at bedtime. Reported on 08/07/2015   . [DISCONTINUED] meclizine (ANTIVERT) 25 MG tablet Take 25 mg by mouth 2 (two) times daily as needed for dizziness.  (Patient not taking: Reported on 04/08/2020)   . [DISCONTINUED] metFORMIN (GLUCOPHAGE) 500 MG  tablet Take 500 mg by mouth daily with breakfast.  04/03/2020: On hold per MD instruction   No facility-administered encounter medications on file as of 04/23/2020.    Surgical History: Past Surgical History:  Procedure Laterality Date  . ABDOMINAL HYSTERECTOMY    . ESOPHAGOGASTRODUODENOSCOPY (EGD) WITH PROPOFOL N/A 01/10/2015   Procedure: ESOPHAGOGASTRODUODENOSCOPY (EGD) WITH PROPOFOL;  Surgeon: Wallace Cullens, MD;  Location: [facility] ENDOSCOPY;  Service: Gastroenterology;  Laterality: N/A;  . RECTAL SURGERY    . RIGHT/LEFT HEART CATH AND CORONARY ANGIOGRAPHY Bilateral 04/08/2020   Procedure: RIGHT/LEFT HEART CATH AND CORONARY ANGIOGRAPHY;  Surgeon: Iran Ouch,  MD;  Location: [facility] INVASIVE CV LAB;  Service: Cardiovascular;  Laterality: Bilateral;  . SAVORY DILATION N/A 01/10/2015   Procedure: SAVORY DILATION;  Surgeon: Wallace Cullens, MD;  Location: [facility] ENDOSCOPY;  Service: Gastroenterology;  Laterality: N/A;  . toenail removal Bilateral    great toes  . TONSILLECTOMY AND ADENOIDECTOMY      Medical History: Past Medical History:  Diagnosis Date  . Anxiety   . Chest pain   . CHF (congestive heart failure) (HCC)   . COPD (chronic obstructive pulmonary disease) (HCC)   . Depression   . Diabetes mellitus without complication (HCC)   . Fibromyalgia   . Graves disease   . Hypertension   . IBS (irritable bowel syndrome)   . PTSD (post-traumatic stress disorder)   . Sinus problem   . Spinal stenosis     Family History: Family History  Problem Relation Age of Onset  . Alcohol abuse Mother   . Depression Mother   . Varicose Veins Mother   . Alcohol abuse Father   . Depression Father   . Alcohol abuse Sister   . Asthma Sister   . COPD Sister   . Depression Sister   . Heart disease Sister   . Hypertension Sister   . Diabetes Sister   . Depression Maternal Grandmother   . Diabetes Paternal Grandmother   . Stroke Paternal Grandmother     Social History   Socioeconomic History  . Marital status: Widowed    Spouse name: Not on file  . Number of children: 2  . Years of education: Not on file  . Highest education level: Not on file  Occupational History  . Not on file  Tobacco Use  . Smoking status: Former Smoker    Packs/day: 0.50    Years: 25.00    Pack years: 12.50    Types: Cigarettes    Quit date: 06/03/2016    Years since quitting: 3.8  . Smokeless tobacco: Never Used  Vaping Use  . Vaping Use: Never used  Substance and Sexual Activity  . Alcohol use: No    Alcohol/week: 0.0 standard drinks  . Drug use: No  . Sexual activity: Not Currently  Other Topics Concern  . Not on file  Social History Narrative   Lives by  herself; 1 daughter Marcelino Duster in [place]: 1 daughter in [place]    Social Determinants of Health   Financial Resource Strain: Low Risk   . Difficulty of Paying Living Expenses: Not hard at all  Food Insecurity: No Food Insecurity  . Worried About Programme researcher, broadcasting/film/video in the Last Year: Never true  . Ran Out of Food in the Last Year: Never true  Transportation Needs: No Transportation Needs  . Lack of Transportation (Medical): No  . Lack of Transportation (Non-Medical): No  Physical Activity:   . Days of Exercise per  Week: Not on file  . Minutes of Exercise per Session: Not on file  Stress:   . Feeling of Stress : Not on file  Social Connections:   . Frequency of Communication with Friends and Family: Not on file  . Frequency of Social Gatherings with Friends and Family: Not on file  . Attends Religious Services: Not on file  . Active Member of Clubs or Organizations: Not on file  . Attends Banker Meetings: Not on file  . Marital Status: Not on file  Intimate Partner Violence: Not At Risk  . Fear of Current or Ex-Partner: No  . Emotionally Abused: No  . Physically Abused: No  . Sexually Abused: No      Review of Systems  Constitutional: Negative for chills, fatigue and unexpected weight change.  HENT: Negative for congestion, postnasal drip, rhinorrhea, sneezing and sore throat.   Eyes: Negative for redness.  Respiratory: Positive for shortness of breath. Negative for cough and chest tightness.   Cardiovascular: Negative for chest pain and palpitations.  Gastrointestinal: Negative for abdominal pain, constipation, diarrhea, nausea and vomiting.  Genitourinary: Negative for dysuria and frequency.  Musculoskeletal: Negative for arthralgias, back pain, joint swelling and neck pain.  Skin: Negative for rash.  Neurological: Negative.  Negative for tremors and numbness.  Hematological: Negative for adenopathy. Does not bruise/bleed easily.  Psychiatric/Behavioral:  Negative for behavioral problems (Depression), sleep disturbance and suicidal ideas. The patient is not nervous/anxious.     Vital Signs: BP 130/70 Comment: 170/80  Pulse 82   Temp (!) 97.1 F (36.2 C)   Resp 16   Ht 5\' 6"  (1.676 m)   Wt 122 lb 9.6 oz (55.6 kg)   SpO2 94%   BMI 19.79 kg/m    Physical Exam Constitutional:      General: She is not in acute distress.    Appearance: She is well-developed. She is not diaphoretic.  HENT:     Head: Normocephalic and atraumatic.     Mouth/Throat:     Pharynx: No oropharyngeal exudate.  Eyes:     Pupils: Pupils are equal, round, and reactive to light.  Neck:     Thyroid: No thyromegaly.     Vascular: No JVD.     Trachea: No tracheal deviation.  Cardiovascular:     Rate and Rhythm: Normal rate and regular rhythm.     Heart sounds: Normal heart sounds. No murmur heard.  No friction rub. No gallop.   Pulmonary:     Effort: Pulmonary effort is normal. No respiratory distress.     Breath sounds: No wheezing or rales.     Comments: Decreased bs bilateral  Chest:     Chest wall: No tenderness.  Abdominal:     General: Bowel sounds are normal.     Palpations: Abdomen is soft.  Musculoskeletal:        General: Normal range of motion.     Cervical back: Normal range of motion and neck supple.  Lymphadenopathy:     Cervical: No cervical adenopathy.  Skin:    General: Skin is warm and dry.  Neurological:     Mental Status: She is alert and oriented to person, place, and time.     Cranial Nerves: No cranial nerve deficit.  Psychiatric:        Behavior: Behavior normal.        Thought Content: Thought content normal.        Judgment: Judgment normal.  Assessment/Plan: 1. COPD with hypoxia (HCC) Continue Symbicort and duoneb as before, continue O2 2 L/c at all times   2. Atherosclerosis of right carotid artery Schedule for repeat U/S   3. Major depressive disorder, recurrent, in remission, unspecified  (HCC) Continue to see mental Health   4. Prediabetes Diet controlled   General Counseling: Deshae verbalizes understanding of the findings of todays visit and agrees with plan of treatment. I have discussed any further diagnostic evaluation that may be needed or ordered today. We also reviewed her medications today. she has been encouraged to call the office with any questions or concerns that should arise related to todays visit.   Total time spent:35 Minutes Time spent includes review of chart, medications, test results, and follow up plan with the patient.      Dr Lyndon Code Internal medicine

## 2020-04-26 ENCOUNTER — Other Ambulatory Visit: Payer: Self-pay

## 2020-04-26 ENCOUNTER — Telehealth: Payer: Self-pay

## 2020-04-26 MED ORDER — AZITHROMYCIN 250 MG PO TABS
250.0000 mg | ORAL_TABLET | Freq: Every day | ORAL | 0 refills | Status: DC
Start: 2020-04-26 — End: 2020-05-08

## 2020-04-26 MED ORDER — PREDNISONE 10 MG PO TABS
ORAL_TABLET | ORAL | 0 refills | Status: DC
Start: 2020-04-26 — End: 2020-05-08

## 2020-04-26 NOTE — Telephone Encounter (Signed)
Confirmation of verbal order signed by provider and and placed in [facility] folder for pickup.

## 2020-04-26 NOTE — Telephone Encounter (Signed)
Pt called  She still coughing and running nose  And no fever as per  Dr Welton Flakes we send prednisone and Zithromax 250 mg for 10 days

## 2020-05-03 ENCOUNTER — Other Ambulatory Visit: Payer: Self-pay

## 2020-05-03 ENCOUNTER — Other Ambulatory Visit (INDEPENDENT_AMBULATORY_CARE_PROVIDER_SITE_OTHER): Payer: Medicare HMO

## 2020-05-03 DIAGNOSIS — I6521 Occlusion and stenosis of right carotid artery: Secondary | ICD-10-CM

## 2020-05-03 DIAGNOSIS — I6523 Occlusion and stenosis of bilateral carotid arteries: Secondary | ICD-10-CM

## 2020-05-08 ENCOUNTER — Other Ambulatory Visit: Payer: Self-pay

## 2020-05-08 ENCOUNTER — Ambulatory Visit (INDEPENDENT_AMBULATORY_CARE_PROVIDER_SITE_OTHER): Payer: Medicare HMO | Admitting: Family

## 2020-05-08 ENCOUNTER — Encounter: Payer: Self-pay | Admitting: Family

## 2020-05-08 ENCOUNTER — Encounter (INDEPENDENT_AMBULATORY_CARE_PROVIDER_SITE_OTHER): Payer: Self-pay

## 2020-05-08 VITALS — BP 124/60 | HR 90 | Ht 66.0 in | Wt 120.0 lb

## 2020-05-08 DIAGNOSIS — R0600 Dyspnea, unspecified: Secondary | ICD-10-CM | POA: Diagnosis not present

## 2020-05-08 DIAGNOSIS — E785 Hyperlipidemia, unspecified: Secondary | ICD-10-CM | POA: Diagnosis not present

## 2020-05-08 DIAGNOSIS — I2510 Atherosclerotic heart disease of native coronary artery without angina pectoris: Secondary | ICD-10-CM

## 2020-05-08 DIAGNOSIS — I272 Pulmonary hypertension, unspecified: Secondary | ICD-10-CM

## 2020-05-08 DIAGNOSIS — J449 Chronic obstructive pulmonary disease, unspecified: Secondary | ICD-10-CM

## 2020-05-08 DIAGNOSIS — R0609 Other forms of dyspnea: Secondary | ICD-10-CM

## 2020-05-08 NOTE — Patient Instructions (Addendum)
Medication Instructions:  No medication changes today.   *If you need a refill on your cardiac medications before your next appointment, please call your pharmacy*   Lab Work: None ordered today.   Our goal is for your LDL or "bad cholesterol" to be less than 70. When last checked yours was 74.   Testing/Procedures: Your cardiac catheterization showed a very mild 20% blockage in one of your heart arteries. Your Aspirin and Rosuvastatin will prevent this from worsening. It also showed mild pulmonary hypertension which is mildly elevated pressures in the lungs.   Follow-Up: At [facility], you and your health needs are our priority.  As part of our continuing mission to provide you with exceptional heart care, we have created designated Provider Care Teams.  These Care Teams include your primary Cardiologist (physician) and Advanced Practice Providers (APPs -  Physician Assistants and Nurse Practitioners) who all work together to provide you with the care you need, when you need it.  We recommend signing up for the patient portal called "MyChart".  Sign up information is provided on this After Visit Summary.  MyChart is used to connect with patients for Virtual Visits (Telemedicine).  Patients are able to view lab/test results, encounter notes, upcoming appointments, etc.  Non-urgent messages can be sent to your provider as well.   To learn more about what you can do with MyChart, go to [email or web address].    Your next appointment:   6 month(s)  The format for your next appointment:   In Person  Provider:   You may see Lorine Bears, MD or one of the following Advanced Practice Providers on your designated Care Team:    Nicolasa Ducking, NP  Eula Listen, PA-C  Marisue Ivan, PA-C  Cadence [place], [place]  Gillian Shields, NP

## 2020-05-08 NOTE — Progress Notes (Signed)
Office Visit    Patient Name: [name] Date of Encounter: 05/08/2020  Primary Care Provider:  Lyndon Code, MD Primary Cardiologist:  Lorine Bears, MD Electrophysiologist:  None   Chief Complaint    [name] is a 74 y.o. female with a hx of mild nonobstructive coronary disease, pulmonary hypertension, exertional dyspnea, COPD with previous tobacco use on home O2, hyperlipidemia, DM2 presents today for follow up after cardiac catheterization.   Past Medical History    Past Medical History:  Diagnosis Date  . Anxiety   . Chest pain   . CHF (congestive heart failure) (HCC)   . COPD (chronic obstructive pulmonary disease) (HCC)   . Depression   . Diabetes mellitus without complication (HCC)   . Fibromyalgia   . Graves disease   . Hypertension   . IBS (irritable bowel syndrome)   . PTSD (post-traumatic stress disorder)   . Sinus problem   . Spinal stenosis    Past Surgical History:  Procedure Laterality Date  . ABDOMINAL HYSTERECTOMY    . ESOPHAGOGASTRODUODENOSCOPY (EGD) WITH PROPOFOL N/A 01/10/2015   Procedure: ESOPHAGOGASTRODUODENOSCOPY (EGD) WITH PROPOFOL;  Surgeon: Wallace Cullens, MD;  Location: [facility] ENDOSCOPY;  Service: Gastroenterology;  Laterality: N/A;  . RECTAL SURGERY    . RIGHT/LEFT HEART CATH AND CORONARY ANGIOGRAPHY Bilateral 04/08/2020   Procedure: RIGHT/LEFT HEART CATH AND CORONARY ANGIOGRAPHY;  Surgeon: Iran Ouch, MD;  Location: [facility] INVASIVE CV LAB;  Service: Cardiovascular;  Laterality: Bilateral;  . SAVORY DILATION N/A 01/10/2015   Procedure: SAVORY DILATION;  Surgeon: Wallace Cullens, MD;  Location: [facility] ENDOSCOPY;  Service: Gastroenterology;  Laterality: N/A;  . toenail removal Bilateral    great toes  . TONSILLECTOMY AND ADENOIDECTOMY     Allergies  Allergies  Allergen Reactions  . Celecoxib     Other reaction(s): Other (qualifier value) Patient is allergic to Celebrex and found that it caused heart failure. CHF  . Pregabalin      Other reaction(s): Localized superficial swelling of skin    History of Present Illness    [name] is a 74 y.o. female with a hx of mild nonobstructive coronary disease, pulmonary hypertension, exertional dyspnea, COPD with previous tobacco use on home O2, hyperlipidemia, DM2 last seen 04/08/20 for cardiac cath.  Seen in consult by Dr. Kirke Corin 03/26/20 due to dyspnea. Echo with her primary care provider 03/25/20 with normal LVEF, diastolic dysfunction, moderate pulmonary hypertension with estimated PASP . She had coronary calcification on CT scan. As such, recommended for [facility]. Subsequent [facility] 04/08/20 with hyperdynamic LVSF, LVEDP mildly elevated, mild pulmonary hypertension, prox-mix Cx 20% stenosed, LAD/RCA with mild luminal irregularities. Medical therapy was recommended for nonobstructive disease.   She presents today for follow-up.  She was recently treated for upper respiratory infection and COPD exacerbation by her primary care provider with azithromycin and prednisone.  She reports she still has some mild congestion and productive cough that this is overall improving.  She reports no chest pain, pressure, tightness.  Reports no shortness of breath at rest.  Her sister dyspnea on exertion is stable at baseline.  She enjoys staying busy around her home going on walks around her property as well as doing puzzle books.   EKGs/Labs/Other Studies Reviewed:   The following studies were reviewed today: Cardiac cath 04/08/20  There is hyperdynamic left ventricular systolic function.  LV end diastolic pressure is mildly elevated.  The left ventricular ejection fraction is greater than 65% by visual estimate.  Prox Cx to Mid Cx lesion is 20% stenosed.   1.  Mildly calcified coronary arteries with mild nonobstructive disease. 2.  Hyperdynamic LV systolic function with an EF of greater than 70%. 3.  Right heart catheterization showed mildly elevated filling pressures, mild pulmonary  hypertension and normal cardiac output.   RA: 10 mmHg RV: 50/5 mmHg PCW: 16 mmHg PA: 43/17 with a mean of 30 mmHg Cardiac output: 4.40 with a cardiac index of 2.76.   Recommendations: Recommend medical therapy for nonobstructive coronary artery disease. Pulmonary hypertension does not seem to be severe enough to require vasodilator therapy. A small dose diuretic can be considered as a trial. Suspect that the majority of her exertional dyspnea is related to underlying lung disease.  EKG:  No EKG today.  Recent Labs: 03/26/2020: BUN 20; Creatinine, Ser 0.76; Hemoglobin 13.7; Platelets 220; Potassium 4.4; Sodium 137  Recent Lipid Panel No results found for: CHOL, TRIG, HDL, CHOLHDL, VLDL, LDLCALC, LDLDIRECT   Home Medications   Current Meds  Medication Sig  . acetaminophen (TYLENOL) 500 MG tablet Take 1,000 mg by mouth in the morning and at bedtime.   Marland Kitchen albuterol (VENTOLIN HFA) 108 (90 Base) MCG/ACT inhaler Inhale 2 puffs into the lungs every 6 (six) hours as needed for wheezing or shortness of breath.  Marland Kitchen alendronate (FOSAMAX) 70 MG tablet Take 70 mg by mouth every Tuesday.   Marland Kitchen aspirin EC 81 MG tablet Take 81 mg by mouth daily. Swallow whole.  . Azelastine HCl 137 MCG/SPRAY SOLN Place 2 sprays into both nostrils in the morning and at bedtime.   . Cyanocobalamin (VITAMIN B-12) 1000 MCG/15ML LIQD Take 1,200 mcg by mouth daily.  Marland Kitchen FLUoxetine (PROZAC) 20 MG capsule Take 40 mg by mouth daily.   . fluticasone (FLONASE) 50 MCG/ACT nasal spray Place 2 sprays into both nostrils in the morning and at bedtime.   Marland Kitchen guaifenesin (HUMIBID E) 400 MG TABS tablet Take 800 mg by mouth every 6 (six) hours as needed (congestion/cough).   . hydrocortisone 2.5 % cream Place 1 application rectally 2 (two) times daily as needed (itching/hemorroids).   Marland Kitchen ipratropium-albuterol (DUONEB) 0.5-2.5 (3) MG/3ML SOLN One vial 3 x  aday for copd with hypoxia (Patient taking differently: Inhale 3 mLs into the lungs in  the morning, at noon, and at bedtime.)  . levothyroxine (SYNTHROID) 88 MCG tablet Take 88 mcg by mouth daily before breakfast.  . LORazepam (ATIVAN) 0.5 MG tablet Take 0.5 mg by mouth at bedtime.  . Multiple Vitamin (MULTIVITAMIN WITH MINERALS) TABS tablet Take 1 tablet by mouth daily. Multivitamin for Seniors  . rosuvastatin (CRESTOR) 20 MG tablet Take 1 tablet (20 mg total) by mouth daily.  . SYMBICORT 160-4.5 MCG/ACT inhaler Inhale 2 puffs into the lungs 2 (two) times daily.  Marland Kitchen tiotropium (SPIRIVA) 18 MCG inhalation capsule Place 18 mcg into inhaler and inhale at bedtime. Reported on 08/07/2015     Review of Systems  All other systems reviewed and are otherwise negative except as noted above.  Physical Exam    VS:  BP 124/60 (BP Location: Right Arm, Patient Position: Sitting, Cuff Size: Normal)   Pulse 90   Ht 5\' 6"  (1.676 m)   Wt 120 lb (54.4 kg)   SpO2 91% Comment: 2 Liters of Oxygen  BMI 19.37 kg/m  , BMI Body mass index is 19.37 kg/m.  Wt Readings from Last 3 Encounters:  05/08/20 120 lb (54.4 kg)  04/23/20 122 lb 9.6 oz (55.6 kg)  04/08/20 117 lb 1 oz (53.1 kg)    GEN: Well nourished, well developed, in no acute distress. HEENT: normal. Neck: Supple, no JVD, carotid bruits, or masses. Cardiac: RRR, no murmurs, rubs, or gallops. No clubbing, cyanosis, edema.  Radials/DP/PT 2+ and equal bilaterally.  Respiratory:  Respirations regular and unlabored, bilateral lower lobes with expiratory wheezes.  On 2 L O2. GI: Soft, nontender, nondistended. MS: No deformity or atrophy. Skin: Warm and dry, no rash. Neuro:  Strength and sensation are intact. Psych: Normal affect.  Assessment & Plan    1. Mild nonobstructive CAD - By cardiac catheterization 04/08/20 prox-mid Cx lesion 20% stenosed and LAD/RCA with mild luminal irregularities.  GDMT includes aspirin, statin.  No beta-blocker secondary to COPD.  Low-sodium, healthy diet and regular cardiovascular exercise  encouraged.  2. Mild pulmonary hypertension / DOE -multifactorial with mild pulmonary hypertension, COPD, deconditioning.  As her recent right and left heart cath 04/08/2020 showed only mild pulmonary hypertension, no indication for vasodilator therapy.  Dr. Kirke Corin suspected that the majority of her exertional dyspnea is related to underlying lung disease.  We could trial low-dose diuretic (Lasix 20mg  QD) however she is recently treated for COPD exacerbation will defer at this time as she feels her breathing is improving.  3. HLD -most recent LDL 74.  Continue Crestor 20 mg daily.  Ideally LDL goal less than 70 in the setting of nonobstructive coronary disease.  4. COPD -continue to follow with primary care provider.  Recently completed course of azithromycin and prednisone.  Still some some productive cough but this is improving.  Encouraged to utilize her as needed guaifenesin.  Disposition: Follow up in 6 month(s) with Dr. or APP   Signed, Kirke Corin, NP 05/08/2020, 9:56 AM [facility]

## 2020-05-15 ENCOUNTER — Other Ambulatory Visit: Payer: Self-pay | Admitting: Hospice and Palliative Medicine

## 2020-05-15 ENCOUNTER — Ambulatory Visit: Payer: Medicare HMO | Admitting: Hospice and Palliative Medicine

## 2020-05-20 ENCOUNTER — Ambulatory Visit: Payer: Medicare HMO | Admitting: Hospice and Palliative Medicine

## 2020-05-22 ENCOUNTER — Other Ambulatory Visit: Payer: Self-pay

## 2020-05-22 ENCOUNTER — Encounter: Payer: Self-pay | Admitting: Hospice and Palliative Medicine

## 2020-05-22 ENCOUNTER — Ambulatory Visit (INDEPENDENT_AMBULATORY_CARE_PROVIDER_SITE_OTHER): Payer: Medicare Other | Admitting: Hospice and Palliative Medicine

## 2020-05-22 ENCOUNTER — Other Ambulatory Visit: Payer: Self-pay | Admitting: *Deleted

## 2020-05-22 VITALS — BP 132/84 | HR 100 | Temp 98.1°F | Resp 16 | Ht 66.0 in | Wt 121.4 lb

## 2020-05-22 DIAGNOSIS — E119 Type 2 diabetes mellitus without complications: Secondary | ICD-10-CM

## 2020-05-22 DIAGNOSIS — I6523 Occlusion and stenosis of bilateral carotid arteries: Secondary | ICD-10-CM

## 2020-05-22 DIAGNOSIS — R0902 Hypoxemia: Secondary | ICD-10-CM

## 2020-05-22 DIAGNOSIS — J449 Chronic obstructive pulmonary disease, unspecified: Secondary | ICD-10-CM | POA: Diagnosis not present

## 2020-05-22 DIAGNOSIS — I10 Essential (primary) hypertension: Secondary | ICD-10-CM

## 2020-05-22 LAB — POCT GLYCOSYLATED HEMOGLOBIN (HGB A1C): Hemoglobin A1C: 5.6 % (ref 4.0–5.6)

## 2020-05-22 MED ORDER — ALBUTEROL SULFATE HFA 108 (90 BASE) MCG/ACT IN AERS: 2.0000 | INHALATION_SPRAY | Freq: Four times a day (QID) | RESPIRATORY_TRACT | 0 refills | Status: DC | PRN

## 2020-05-22 NOTE — Patient Instructions (Signed)
Goals Addressed            This Visit's Progress   . Manage Fatigue (Tiredness)       Timeframe:  Long-Range Goal Priority:  High Start Date: 02/26/20                            Expected End Date:  11/14/20                     Follow Up Date 09/14/20   - eat healthy - get at least 7 to 8 hours of sleep at night - get outdoors every day (weather permitting)    Why is this important?   Feeling tired or worn out is a common symptom of COPD (chronic obstructive pulmonary disease).  Learning when you feel your best and when you need rest is important.  Managing the tiredness (fatigue) will help you be active and enjoy life.     Notes: Patient reports her COPD is at baseline currently. At this time patient's fatigue is managed pretty well. Patient reports walking routinely to maintain her strength and overall health.     . Track and Manage My Symptoms       Timeframe:  Long-Range Goal Priority:  High Start Date:  02/26/20                           Expected End Date: 60/30/22                     Follow Up Date 08/30/20   - develop a rescue plan - follow rescue plan if symptoms flare-up - keep follow-up appointments    Why is this important?   Tracking your symptoms and other information about your health helps your doctor plan your care.  Write down the symptoms, the time of day, what you were doing and what medicine you are taking.  You will soon learn how to manage your symptoms.     Notes: Patient reports quickly contacting her provider with increased COPD symptoms. Nurse will send patient COPD education that includes zones and action plans.

## 2020-05-22 NOTE — Patient Outreach (Signed)
[facility]) Care Management  [facility] Care Manager  05/22/2020   [name] 1946/04/02 [id number]  Subjective: Successful telephone outreach call to patient. HIPAA identifiers obtained. Patient states she is doing well. Patient reports  she resides in [facility] housing and has 2 case workers that assist her.  Patient reporting the case workers visit about once a week and one of them transports her to her medical appointments. Patient also reports she receives home health aide 7 days a week, 2 1/2 hours a day.  Ambulates independently and denies any falls within the last 3 years.  Reports aide assist with ADLs of bathing and IADLs of cooking and cleaning. Her home environment is safe, emotionally she is in a good place presently, and she feels well supported by her family as well as case workers and Engineer, production.   Patient states that her COPD is currently well controlled. She recently had to take to cycles of antibiotic and prednisone to recover from a mild exacerbation. She takes her nebulizer twice daily which is typical for her and explains that she does contact her provider right away for any worsening COPD symptoms. She does wear oxygen via nasal cannula at 2 liters. Patient walks routinely to maintain her strength and overall health. She states that she has a good appetite and has recently gained weight. She does supplement with Boost as needed. Patient did not have any further questions or concerns today.    Encounter Medications:  Outpatient Encounter Medications as of 05/22/2020  Medication Sig Note  . acetaminophen (TYLENOL) 500 MG tablet Take 1,000 mg by mouth in the morning and at bedtime.    Marland Kitchen albuterol (VENTOLIN HFA) 108 (90 Base) MCG/ACT inhaler Inhale 2 puffs into the lungs every 6 (six) hours as needed for wheezing or shortness of breath.   Marland Kitchen alendronate (FOSAMAX) 70 MG tablet Take 70 mg by mouth every Tuesday.    Marland Kitchen aspirin EC 81 MG tablet Take 81 mg by mouth daily. Swallow whole.    . Azelastine HCl 137 MCG/SPRAY SOLN Place 2 sprays into both nostrils in the morning and at bedtime.    . Cyanocobalamin (VITAMIN B-12) 1000 MCG/15ML LIQD Take 1,200 mcg by mouth daily.   Marland Kitchen FLUoxetine (PROZAC) 20 MG capsule Take 40 mg by mouth daily.    . fluticasone (FLONASE) 50 MCG/ACT nasal spray Place 2 sprays into both nostrils in the morning and at bedtime.    Marland Kitchen guaifenesin (HUMIBID E) 400 MG TABS tablet Take 800 mg by mouth every 6 (six) hours as needed (congestion/cough).    . hydrocortisone 2.5 % cream Place 1 application rectally 2 (two) times daily as needed (itching/hemorroids).    Marland Kitchen ipratropium-albuterol (DUONEB) 0.5-2.5 (3) MG/3ML SOLN One vial 3 x  aday for copd with hypoxia (Patient taking differently: Inhale 3 mLs into the lungs in the morning, at noon, and at bedtime.) 05/22/2020: Patient states she takes twice daily  . levothyroxine (SYNTHROID) 88 MCG tablet Take 88 mcg by mouth daily before breakfast.   . LORazepam (ATIVAN) 0.5 MG tablet Take 0.5 mg by mouth at bedtime.   . Multiple Vitamin (MULTIVITAMIN WITH MINERALS) TABS tablet Take 1 tablet by mouth daily. Multivitamin for Seniors   . rosuvastatin (CRESTOR) 20 MG tablet Take 1 tablet (20 mg total) by mouth daily.   . SYMBICORT 160-4.5 MCG/ACT inhaler Inhale 2 puffs into the lungs 2 (two) times daily.   Marland Kitchen tiotropium (SPIRIVA) 18 MCG inhalation capsule Place 18 mcg into inhaler  and inhale at bedtime. Reported on 08/07/2015    No facility-administered encounter medications on file as of 05/22/2020.    Functional Status:  In your present state of health, do you have any difficulty performing the following activities: 04/08/2020 03/12/2020  Hearing? N N  Vision? N Y  Difficulty concentrating or making decisions? N -  Walking or climbing stairs? Y Y  Comment - -  Dressing or bathing? Y Y  Comment occ.trouble; personal care aide comes to help pt. -  Doing errands, shopping? - Y  Comment - -  Preparing Food and eating ? - -   Comment - -  Using the Toilet? - -  In the past six months, have you accidently leaked urine? - -  Do you have problems with loss of bowel control? - -  Managing your Medications? - -  Managing your Finances? - -  Housekeeping or managing your Housekeeping? - -  Comment - -  Some recent data might be hidden    Fall/Depression Screening: Fall Risk  05/22/2020 05/22/2020 05/22/2020  Falls in the past year? 0 0 0  Comment - - -  Number falls in past yr: 0 0 -  Comment - - -  Injury with Fall? 0 0 -  Comment - - -  Risk for fall due to : History of fall(s);Impaired balance/gait;Impaired mobility History of fall(s) -  Follow up Falls evaluation completed;Education provided;Falls prevention discussed Falls prevention discussed;Education provided;Falls evaluation completed -   PHQ 2/9 Scores 05/22/2020 05/22/2020 03/12/2020 02/27/2020 02/26/2020 12/13/2019 09/20/2019  PHQ - 2 Score 0 0 0 0 0 0 0  PHQ- 9 Score - - - - - - -  Exception Documentation - - - - - - -    Assessment:  Goals Addressed            This Visit's Progress   . Manage Fatigue (Tiredness)       Timeframe:  Long-Range Goal Priority:  High Start Date: 02/26/20                            Expected End Date:  11/14/20                     Follow Up Date 09/14/20   - eat healthy - get at least 7 to 8 hours of sleep at night - get outdoors every day (weather permitting)    Why is this important?   Feeling tired or worn out is a common symptom of COPD (chronic obstructive pulmonary disease).  Learning when you feel your best and when you need rest is important.  Managing the tiredness (fatigue) will help you be active and enjoy life.     Notes: Patient reports her COPD is at baseline currently. At this time patient's fatigue is managed pretty well. Patient reports walking routinely to maintain her strength and overall health.     . Track and Manage My Symptoms       Timeframe:  Long-Range Goal Priority:  High Start Date:   02/26/20                           Expected End Date: 60/30/22                     Follow Up Date 08/30/20   - develop a rescue plan - follow rescue plan if  symptoms flare-up - keep follow-up appointments    Why is this important?   Tracking your symptoms and other information about your health helps your doctor plan your care.  Write down the symptoms, the time of day, what you were doing and what medicine you are taking.  You will soon learn how to manage your symptoms.     Notes: Patient reports quickly contacting her provider with increased COPD symptoms. Nurse will send patient COPD education that includes zones and action plans.        Plan: RN Health Coach will send PCP today's assessment note, will send patient COPD packet, and will call patient within the month of April. Follow-up:  Patient agrees to Care Plan and Follow-up.   Emelia Loron RN, BSN [place] [phone] [email or web address]

## 2020-05-22 NOTE — Progress Notes (Signed)
[facility] [address] [place], [place] [address]  Internal MEDICINE  Office Visit Note  Patient Name: [name]  [id number]  [id number]  Date of Service: 05/23/2020  Chief Complaint  Patient presents with  . Follow-up    Review ultrasound.  . Depression  . Hypertension    HPI Patient is here for routine follow-up Carotid doppler US ordered at last visit, preliminary report shows significant plaque build-up in bilateral carotids--on statin therapy BP remains controlled without therapy DM-all medications were discontinued as A1C levels were well tolerated  Requesting podiatry referral as it is hard for her to perform foot care due to chronic respiraotry complications, no complications with toes or feet at this time  Wearing 2LPM  supplemental oxygen for COPD with hypoxia--breathing remains stable  Current Medication: Outpatient Encounter Medications as of 05/22/2020  Medication Sig Note  . acetaminophen (TYLENOL) 500 MG tablet Take 1,000 mg by mouth in the morning and at bedtime.    Marland Kitchen alendronate (FOSAMAX) 70 MG tablet Take 70 mg by mouth every Tuesday.    Marland Kitchen aspirin EC 81 MG tablet Take 81 mg by mouth daily. Swallow whole.   . Azelastine HCl 137 MCG/SPRAY SOLN Place 2 sprays into both nostrils in the morning and at bedtime.    . Cyanocobalamin (VITAMIN B-12) 1000 MCG/15ML LIQD Take 1,200 mcg by mouth daily.   Marland Kitchen FLUoxetine (PROZAC) 20 MG capsule Take 40 mg by mouth daily.    . fluticasone (FLONASE) 50 MCG/ACT nasal spray Place 2 sprays into both nostrils in the morning and at bedtime.    Marland Kitchen guaifenesin (HUMIBID E) 400 MG TABS tablet Take 800 mg by mouth every 6 (six) hours as needed (congestion/cough).    . hydrocortisone 2.5 % cream Place 1 application rectally 2 (two) times daily as needed (itching/hemorroids).    Marland Kitchen ipratropium-albuterol (DUONEB) 0.5-2.5 (3) MG/3ML SOLN One vial 3 x  aday for copd with hypoxia (Patient taking differently: Inhale 3 mLs into the  lungs in the morning, at noon, and at bedtime.) 05/22/2020: Patient states she takes twice daily  . levothyroxine (SYNTHROID) 88 MCG tablet Take 88 mcg by mouth daily before breakfast.   . LORazepam (ATIVAN) 0.5 MG tablet Take 0.5 mg by mouth at bedtime.   . Multiple Vitamin (MULTIVITAMIN WITH MINERALS) TABS tablet Take 1 tablet by mouth daily. Multivitamin for Seniors   . rosuvastatin (CRESTOR) 20 MG tablet Take 1 tablet (20 mg total) by mouth daily.   . SYMBICORT 160-4.5 MCG/ACT inhaler Inhale 2 puffs into the lungs 2 (two) times daily.   Marland Kitchen tiotropium (SPIRIVA) 18 MCG inhalation capsule Place 18 mcg into inhaler and inhale at bedtime. Reported on 08/07/2015   . [DISCONTINUED] albuterol (VENTOLIN HFA) 108 (90 Base) MCG/ACT inhaler Inhale 2 puffs into the lungs every 6 (six) hours as needed for wheezing or shortness of breath.   Marland Kitchen albuterol (VENTOLIN HFA) 108 (90 Base) MCG/ACT inhaler Inhale 2 puffs into the lungs every 6 (six) hours as needed for wheezing or shortness of breath.    No facility-administered encounter medications on file as of 05/22/2020.    Surgical History: Past Surgical History:  Procedure Laterality Date  . ABDOMINAL HYSTERECTOMY    . ESOPHAGOGASTRODUODENOSCOPY (EGD) WITH PROPOFOL N/A 01/10/2015   Procedure: ESOPHAGOGASTRODUODENOSCOPY (EGD) WITH PROPOFOL;  Surgeon: Wallace Cullens, MD;  Location: [facility] ENDOSCOPY;  Service: Gastroenterology;  Laterality: N/A;  . RECTAL SURGERY    . RIGHT/LEFT HEART CATH AND CORONARY ANGIOGRAPHY Bilateral 04/08/2020  Procedure: RIGHT/LEFT HEART CATH AND CORONARY ANGIOGRAPHY;  Surgeon: Iran Ouch, MD;  Location: [facility] INVASIVE CV LAB;  Service: Cardiovascular;  Laterality: Bilateral;  . SAVORY DILATION N/A 01/10/2015   Procedure: SAVORY DILATION;  Surgeon: Wallace Cullens, MD;  Location: [facility] ENDOSCOPY;  Service: Gastroenterology;  Laterality: N/A;  . toenail removal Bilateral    great toes  . TONSILLECTOMY AND ADENOIDECTOMY      Medical  History: Past Medical History:  Diagnosis Date  . Anxiety   . Chest pain   . CHF (congestive heart failure) (HCC)   . COPD (chronic obstructive pulmonary disease) (HCC)   . Depression   . Diabetes mellitus without complication (HCC)   . Fibromyalgia   . Graves disease   . Hypertension   . IBS (irritable bowel syndrome)   . PTSD (post-traumatic stress disorder)   . Sinus problem   . Spinal stenosis     Family History: Family History  Problem Relation Age of Onset  . Alcohol abuse Mother   . Depression Mother   . Varicose Veins Mother   . Alcohol abuse Father   . Depression Father   . Alcohol abuse Sister   . Asthma Sister   . COPD Sister   . Depression Sister   . Heart disease Sister   . Hypertension Sister   . Diabetes Sister   . Depression Maternal Grandmother   . Diabetes Paternal Grandmother   . Stroke Paternal Grandmother     Social History   Socioeconomic History  . Marital status: Widowed    Spouse name: Not on file  . Number of children: 2  . Years of education: Not on file  . Highest education level: Not on file  Occupational History  . Not on file  Tobacco Use  . Smoking status: Former Smoker    Packs/day: 0.50    Years: 25.00    Pack years: 12.50    Types: Cigarettes    Quit date: 06/03/2016    Years since quitting: 3.9  . Smokeless tobacco: Never Used  Vaping Use  . Vaping Use: Never used  Substance and Sexual Activity  . Alcohol use: No    Alcohol/week: 0.0 standard drinks  . Drug use: No  . Sexual activity: Not Currently  Other Topics Concern  . Not on file  Social History Narrative   Lives by herself; 1 daughter Marcelino Duster in [place]: 1 daughter in [place]    Social Determinants of Health   Financial Resource Strain: Low Risk   . Difficulty of Paying Living Expenses: Not hard at all  Food Insecurity: No Food Insecurity  . Worried About Programme researcher, broadcasting/film/video in the Last Year: Never true  . Ran Out of Food in the Last Year: Never  true  Transportation Needs: No Transportation Needs  . Lack of Transportation (Medical): No  . Lack of Transportation (Non-Medical): No  Physical Activity: Not on file  Stress: Not on file  Social Connections: Not on file  Intimate Partner Violence: Not At Risk  . Fear of Current or Ex-Partner: No  . Emotionally Abused: No  . Physically Abused: No  . Sexually Abused: No   Review of Systems  Constitutional: Negative for chills, diaphoresis and fatigue.  HENT: Negative for ear pain, postnasal drip and sinus pressure.   Eyes: Negative for photophobia, discharge, redness, itching and visual disturbance.  Respiratory: Positive for shortness of breath. Negative for cough and wheezing.   Cardiovascular: Negative for chest pain, palpitations and  leg swelling.  Gastrointestinal: Negative for abdominal pain, constipation, diarrhea, nausea and vomiting.  Genitourinary: Negative for dysuria and flank pain.  Musculoskeletal: Negative for arthralgias, back pain, gait problem and neck pain.  Skin: Negative for color change.  Allergic/Immunologic: Negative for environmental allergies and food allergies.  Neurological: Negative for dizziness and headaches.  Hematological: Does not bruise/bleed easily.  Psychiatric/Behavioral: Negative for agitation, behavioral problems (depression) and hallucinations.    Vital Signs: BP 132/84   Pulse 100   Temp 98.1 F (36.7 C)   Resp 16   Ht 5\' 6"  (1.676 m)   Wt 121 lb 6.4 oz (55.1 kg)   SpO2 97%   BMI 19.59 kg/m    Physical Exam Vitals reviewed.  Constitutional:      Appearance: Normal appearance. She is normal weight.  Cardiovascular:     Rate and Rhythm: Normal rate and regular rhythm.     Pulses: Normal pulses.     Heart sounds: Normal heart sounds.  Pulmonary:     Effort: Pulmonary effort is normal.     Breath sounds: Normal breath sounds.  Abdominal:     General: Abdomen is flat.     Palpations: Abdomen is soft.  Musculoskeletal:         General: Normal range of motion.     Cervical back: Normal range of motion.  Skin:    General: Skin is warm.  Neurological:     General: No focal deficit present.     Mental Status: She is alert and oriented to person, place, and time. Mental status is at baseline.  Psychiatric:        Mood and Affect: Mood normal.        Behavior: Behavior normal.        Thought Content: Thought content normal.        Judgment: Judgment normal.    Assessment/Plan: 1. Bilateral carotid artery occlusion Significant bilateral carotid artery occlusion on carotid US--proceed with CTA for further evaluation - CT ANGIO NECK W OR WO CONTRAST; Future  2. Diet-controlled type 2 diabetes mellitus (HCC) A1C 5.6, remains well controlled through diet--monitor closely Referral to podiatry to assist with toe nail care - POCT HgB A1C - Ambulatory referral to Podiatry  3. COPD with hypoxia ([place]) Stable today, requesting refills of albuterol inhaler - albuterol (VENTOLIN HFA) 108 (90 Base) MCG/ACT inhaler; Inhale 2 puffs into the lungs every 6 (six) hours as needed for wheezing or shortness of breath.  Dispense: 3 each; Refill: 0  4. Essential hypertension BP and HR remain well controlled, HR on higher end of normal, no longer on medication, monitor closely  General Counseling: Delesia verbalizes understanding of the findings of todays visit and agrees with plan of treatment. I have discussed any further diagnostic evaluation that may be needed or ordered today. We also reviewed her medications today. she has been encouraged to call the office with any questions or concerns that should arise related to todays visit.    Orders Placed This Encounter  Procedures  . CT ANGIO NECK W OR WO CONTRAST  . Ambulatory referral to Podiatry  . POCT HgB A1C    Meds ordered this encounter  Medications  . albuterol (VENTOLIN HFA) 108 (90 Base) MCG/ACT inhaler    Sig: Inhale 2 puffs into the lungs every 6 (six) hours as  needed for wheezing or shortness of breath.    Dispense:  3 each    Refill:  0    Pt need appt  Time spent: 30 Minutes Time spent includes review of chart, medications, test results and follow-up plan with the patient.  This patient was seen by Theodoro Grist AGNP-C in Collaboration with Dr Lavera Guise as a part of collaborative care agreement     Tanna Furry. Culley Hedeen AGNP-C Internal medicine

## 2020-05-23 ENCOUNTER — Encounter: Payer: Self-pay | Admitting: Hospice and Palliative Medicine

## 2020-05-24 ENCOUNTER — Telehealth: Payer: Self-pay

## 2020-05-24 NOTE — Telephone Encounter (Signed)
Spoke with pt, informed her we can manage psychiatric meds.

## 2020-05-24 NOTE — Telephone Encounter (Signed)
Scheduled pt for CT scan at [facility] for Friday 05/31/20 at 9:30AM, informed pt to check in at [facility] mall at 9:15AM and liquids only 4 hrs prior to procedure.

## 2020-05-24 NOTE — Telephone Encounter (Signed)
Yes, we can manage her medications. Thanks.

## 2020-05-31 ENCOUNTER — Other Ambulatory Visit: Payer: Self-pay

## 2020-05-31 ENCOUNTER — Ambulatory Visit
Admission: RE | Admit: 2020-05-31 | Discharge: 2020-05-31 | Disposition: A | Discharge status: Home / Self Care | Payer: Medicare Other | Source: Ambulatory Visit | Attending: Hospice and Palliative Medicine | Admitting: Hospice and Palliative Medicine

## 2020-05-31 ENCOUNTER — Telehealth: Payer: Self-pay

## 2020-05-31 DIAGNOSIS — I6502 Occlusion and stenosis of left vertebral artery: Secondary | ICD-10-CM | POA: Diagnosis not present

## 2020-05-31 DIAGNOSIS — I6523 Occlusion and stenosis of bilateral carotid arteries: Secondary | ICD-10-CM | POA: Insufficient documentation

## 2020-05-31 LAB — POCT I-STAT CREATININE: Creatinine, Ser: 0.9 mg/dL (ref 0.44–1.00)

## 2020-05-31 MED ORDER — IOHEXOL 350 MG/ML SOLN
75.0000 mL | Freq: Once | INTRAVENOUS | Status: AC | PRN
  Administered 2020-05-31: 75 mL via INTRAVENOUS

## 2020-05-31 NOTE — Telephone Encounter (Signed)
Confirmation of verbal order for Home medical equipment signed by provider and placed in AHP folder.

## 2020-06-03 NOTE — Procedures (Signed)
[facility] [address] [place], [place] [address]  DATE OF SERVICE: May 03, 2020  CAROTID DOPPLER INTERPRETATION:  Bilateral Carotid Ultrsasound and Color Doppler Examination was performed. The RIGHT CCA shows moderate plaque in the vessel. The LEFT CCA shows mild to moderate plaque in the vessel. There was no significant intimal thickening noted in the RIGHT carotid artery. There was no significant intimal thickening in the LEFT carotid artery.  The RIGHT CCA shows peak systolic velocity of 79 cm per second. The end diastolic velocity is 20 cm per second on the RIGHT side. The RIGHT ICA shows peak systolic velocity of 215 per second. RIGHT sided ICA end diastolic velocity is 51 cm per second. The RIGHT ECA shows a peak systolic velocity of 107 cm per second. The ICA/CCA ratio is calculated to be 2.7. This suggests 50 to 69% stenosis. The Vertebral Artery shows antegrade flow.  The LEFT CCA shows peak systolic velocity of 72 cm per second. The end diastolic velocity is 22 cm per second on the LEFT side. The LEFT ICA shows peak systolic velocity of 113 per second. LEFT sided ICA end diastolic velocity is 33 cm per second. The LEFT ECA shows a peak systolic velocity of 122 cm per second. The ICA/CCA ratio is calculated to be 1.5. This suggests 50 to 60% stenosis. The Vertebral Artery shows antegrade flow.   Impression:    The RIGHT CAROTID shows 50 to 69% stenosis. The LEFT CAROTID shows 50 to 60% stenosis.  There is mild to moderate plaque formation noted on the LEFT and moderate on the RIGHT  side. Consider a repeat Carotid doppler if clinical situation and symptoms warrant in 6-12 months. Patient should be encouraged to change lifestyles such as smoking cessation, regular exercise and dietary modification. Use of statins in the right clinical setting and ASA is encouraged.  Yevonne Pax, MD [facility] Pulmonary Critical Care Medicine

## 2020-06-27 DIAGNOSIS — J449 Chronic obstructive pulmonary disease, unspecified: Secondary | ICD-10-CM | POA: Diagnosis not present

## 2020-06-29 DIAGNOSIS — R0602 Shortness of breath: Secondary | ICD-10-CM | POA: Diagnosis not present

## 2020-07-03 ENCOUNTER — Other Ambulatory Visit: Payer: Self-pay

## 2020-07-03 ENCOUNTER — Encounter: Payer: Self-pay | Admitting: Hospice and Palliative Medicine

## 2020-07-03 ENCOUNTER — Ambulatory Visit (INDEPENDENT_AMBULATORY_CARE_PROVIDER_SITE_OTHER): Payer: Medicare Other | Admitting: Hospice and Palliative Medicine

## 2020-07-03 VITALS — BP 154/64 | HR 100 | Temp 96.5°F | Resp 16 | Ht 66.0 in | Wt 122.0 lb

## 2020-07-03 DIAGNOSIS — F5101 Primary insomnia: Secondary | ICD-10-CM | POA: Diagnosis not present

## 2020-07-03 DIAGNOSIS — R0902 Hypoxemia: Secondary | ICD-10-CM | POA: Diagnosis not present

## 2020-07-03 DIAGNOSIS — J449 Chronic obstructive pulmonary disease, unspecified: Secondary | ICD-10-CM | POA: Diagnosis not present

## 2020-07-03 DIAGNOSIS — I10 Essential (primary) hypertension: Secondary | ICD-10-CM

## 2020-07-03 DIAGNOSIS — I6523 Occlusion and stenosis of bilateral carotid arteries: Secondary | ICD-10-CM

## 2020-07-03 DIAGNOSIS — F3340 Major depressive disorder, recurrent, in remission, unspecified: Secondary | ICD-10-CM

## 2020-07-03 DIAGNOSIS — I700 Atherosclerosis of aorta: Secondary | ICD-10-CM | POA: Diagnosis not present

## 2020-07-03 MED ORDER — FLUOXETINE HCL 20 MG PO CAPS: 40.0000 mg | ORAL_CAPSULE | Freq: Every day | ORAL | 1 refills | Status: DC

## 2020-07-03 MED ORDER — LORAZEPAM 1 MG PO TABS: 1.0000 mg | ORAL_TABLET | Freq: Every day | ORAL | 1 refills | Status: DC

## 2020-07-03 NOTE — Progress Notes (Signed)
[facility] [address] [place], [place] [address]  Internal MEDICINE  Office Visit Note  Patient Name: [name]  [id number]  [id number]  Date of Service: 07/06/2020  Chief Complaint  Patient presents with  . Follow-up    Discuss results, anxiety management and mental health, refill request, discuss meds  . Hypertension  . Diabetes  . Depression  . Anxiety  . COPD  . Quality Metric Gaps    Colonoscopy, dexa, PNA, foot exam    HPI Patient is here for routine follow-up Reviewed results of carotid CTA-- IMPRESSION: 1. Calcified plaque at both carotid bifurcations and ICA bulb regions. 60-65% stenosis of the proximal ICA bulb on the right. 35-40% stenosis of the proximal ICA on the left at the bifurcation. These findings appear similar to the ultrasound study of 2018. 2. Dominant right vertebral artery widely patent. 50-70% stenosis at the origin of the non dominant left vertebral artery. 3. Emphysema and aortic atherosclerosis  Over the last few weeks she has noticed that her anxiety has increased, causing her to also feel more short of breath when she is feeling anxious Not sleeping well at night due to anxiety, unable to stop racing thoughts Continues to wear continuous oxygen day and night for history of COPD with hypoxia Has been followed by psychiatry in the past but insurance no longer provides coverage for those services and she is unable to afford out of pocket cost  Current Medication: Outpatient Encounter Medications as of 07/03/2020  Medication Sig Note  . LORazepam (ATIVAN) 1 MG tablet Take 1 tablet (1 mg total) by mouth at bedtime.   Marland Kitchen. acetaminophen (TYLENOL) 500 MG tablet Take 1,000 mg by mouth in the morning and at bedtime.    Marland Kitchen. alendronate (FOSAMAX) 70 MG tablet Take 70 mg by mouth every Tuesday.    Marland Kitchen. aspirin EC 81 MG tablet Take 81 mg by mouth daily. Swallow whole.   . Azelastine HCl 137 MCG/SPRAY SOLN Place 2 sprays into both nostrils in  the morning and at bedtime.    . Cyanocobalamin (VITAMIN B-12) 1000 MCG/15ML LIQD Take 1,200 mcg by mouth daily.   Marland Kitchen. FLUoxetine (PROZAC) 20 MG capsule Take 2 capsules (40 mg total) by mouth daily.   . fluticasone (FLONASE) 50 MCG/ACT nasal spray Place 2 sprays into both nostrils in the morning and at bedtime.    Marland Kitchen. guaifenesin (HUMIBID E) 400 MG TABS tablet Take 800 mg by mouth every 6 (six) hours as needed (congestion/cough).    . hydrocortisone 2.5 % cream Place 1 application rectally 2 (two) times daily as needed (itching/hemorroids).    Marland Kitchen. ipratropium-albuterol (DUONEB) 0.5-2.5 (3) MG/3ML SOLN One vial 3 x  aday for copd with hypoxia (Patient taking differently: Inhale 3 mLs into the lungs in the morning, at noon, and at bedtime.) 05/22/2020: Patient states she takes twice daily  . levothyroxine (SYNTHROID) 88 MCG tablet Take 88 mcg by mouth daily before breakfast.   . Multiple Vitamin (MULTIVITAMIN WITH MINERALS) TABS tablet Take 1 tablet by mouth daily. Multivitamin for Seniors   . rosuvastatin (CRESTOR) 20 MG tablet Take 1 tablet (20 mg total) by mouth daily.   . SYMBICORT 160-4.5 MCG/ACT inhaler Inhale 2 puffs into the lungs 2 (two) times daily.   Marland Kitchen. tiotropium (SPIRIVA) 18 MCG inhalation capsule Place 18 mcg into inhaler and inhale at bedtime. Reported on 08/07/2015   . [DISCONTINUED] albuterol (VENTOLIN HFA) 108 (90 Base) MCG/ACT inhaler Inhale 2 puffs into the lungs every  6 (six) hours as needed for wheezing or shortness of breath.   . [DISCONTINUED] FLUoxetine (PROZAC) 20 MG capsule Take 40 mg by mouth daily.    . [DISCONTINUED] LORazepam (ATIVAN) 0.5 MG tablet Take 0.5 mg by mouth at bedtime.    No facility-administered encounter medications on file as of 07/03/2020.    Surgical History: Past Surgical History:  Procedure Laterality Date  . ABDOMINAL HYSTERECTOMY    . ESOPHAGOGASTRODUODENOSCOPY (EGD) WITH PROPOFOL N/A 01/10/2015   Procedure: ESOPHAGOGASTRODUODENOSCOPY (EGD) WITH PROPOFOL;   Surgeon: Wallace Cullens, MD;  Location: [facility] ENDOSCOPY;  Service: Gastroenterology;  Laterality: N/A;  . RECTAL SURGERY    . RIGHT/LEFT HEART CATH AND CORONARY ANGIOGRAPHY Bilateral 04/08/2020   Procedure: RIGHT/LEFT HEART CATH AND CORONARY ANGIOGRAPHY;  Surgeon: Iran Ouch, MD;  Location: [facility] INVASIVE CV LAB;  Service: Cardiovascular;  Laterality: Bilateral;  . SAVORY DILATION N/A 01/10/2015   Procedure: SAVORY DILATION;  Surgeon: Wallace Cullens, MD;  Location: [facility] ENDOSCOPY;  Service: Gastroenterology;  Laterality: N/A;  . toenail removal Bilateral    great toes  . TONSILLECTOMY AND ADENOIDECTOMY      Medical History: Past Medical History:  Diagnosis Date  . Anxiety   . Chest pain   . CHF (congestive heart failure) (HCC)   . COPD (chronic obstructive pulmonary disease) (HCC)   . Depression   . Diabetes mellitus without complication (HCC)   . Fibromyalgia   . Graves disease   . Hypertension   . IBS (irritable bowel syndrome)   . PTSD (post-traumatic stress disorder)   . Sinus problem   . Spinal stenosis     Family History: Family History  Problem Relation Age of Onset  . Alcohol abuse Mother   . Depression Mother   . Varicose Veins Mother   . Alcohol abuse Father   . Depression Father   . Alcohol abuse Sister   . Asthma Sister   . COPD Sister   . Depression Sister   . Heart disease Sister   . Hypertension Sister   . Diabetes Sister   . Depression Maternal Grandmother   . Diabetes Paternal Grandmother   . Stroke Paternal Grandmother     Social History   Socioeconomic History  . Marital status: Widowed    Spouse name: Not on file  . Number of children: 2  . Years of education: Not on file  . Highest education level: Not on file  Occupational History  . Not on file  Tobacco Use  . Smoking status: Former Smoker    Packs/day: 0.50    Years: 25.00    Pack years: 12.50    Types: Cigarettes    Quit date: 06/03/2016    Years since quitting: 4.0  . Smokeless  tobacco: Never Used  Vaping Use  . Vaping Use: Never used  Substance and Sexual Activity  . Alcohol use: No    Alcohol/week: 0.0 standard drinks  . Drug use: No  . Sexual activity: Not Currently  Other Topics Concern  . Not on file  Social History Narrative   Lives by herself; 1 daughter Marcelino Duster in [place]: 1 daughter in [place]    Social Determinants of Health   Financial Resource Strain: Low Risk   . Difficulty of Paying Living Expenses: Not hard at all  Food Insecurity: No Food Insecurity  . Worried About Programme researcher, broadcasting/film/video in the Last Year: Never true  . Ran Out of Food in the Last Year: Never true  Transportation  Needs: No Transportation Needs  . Lack of Transportation (Medical): No  . Lack of Transportation (Non-Medical): No  Physical Activity: Not on file  Stress: Not on file  Social Connections: Not on file  Intimate Partner Violence: Not At Risk  . Fear of Current or Ex-Partner: No  . Emotionally Abused: No  . Physically Abused: No  . Sexually Abused: No      Review of Systems  Constitutional: Positive for fatigue. Negative for chills and diaphoresis.  HENT: Negative for ear pain, postnasal drip and sinus pressure.   Eyes: Negative for photophobia, discharge, redness, itching and visual disturbance.  Respiratory: Positive for shortness of breath. Negative for cough and wheezing.   Cardiovascular: Negative for chest pain, palpitations and leg swelling.  Gastrointestinal: Negative for abdominal pain, constipation, diarrhea, nausea and vomiting.  Genitourinary: Negative for dysuria and flank pain.  Musculoskeletal: Negative for arthralgias, back pain, gait problem and neck pain.  Skin: Negative for color change.  Allergic/Immunologic: Negative for environmental allergies and food allergies.  Neurological: Negative for dizziness and headaches.  Hematological: Does not bruise/bleed easily.  Psychiatric/Behavioral: Positive for sleep disturbance. Negative for  agitation, behavioral problems (depression) and hallucinations. The patient is nervous/anxious.     Vital Signs: BP (!) 154/64   Pulse 100   Temp (!) 96.5 F (35.8 C)   Resp 16   Ht 5\' 6"  (1.676 m)   Wt 122 lb (55.3 kg)   SpO2 98%   BMI 19.69 kg/m    Physical Exam Vitals reviewed.  Constitutional:      Appearance: Normal appearance. She is obese.  Cardiovascular:     Rate and Rhythm: Normal rate and regular rhythm.     Pulses: Normal pulses.     Heart sounds: Normal heart sounds.  Pulmonary:     Effort: Pulmonary effort is normal.     Breath sounds: Normal breath sounds.  Abdominal:     General: Abdomen is flat.     Palpations: Abdomen is soft.  Musculoskeletal:        General: Normal range of motion.     Cervical back: Normal range of motion.  Skin:    General: Skin is warm.  Neurological:     General: No focal deficit present.     Mental Status: She is alert and oriented to person, place, and time. Mental status is at baseline.  Psychiatric:        Mood and Affect: Mood normal.        Behavior: Behavior normal.        Thought Content: Thought content normal.        Judgment: Judgment normal.    Assessment/Plan: 1. Bilateral carotid artery occlusion Referral to vascular for further evaluation and treatment - Ambulatory referral to Vascular Surgery  2. Essential hypertension Elevated today, typically well controlled, anxious today and has been without restful sleep for several nights  3. COPD with hypoxia (HCC) Continue with all supportive therapies  4. Aortic atherosclerosis (HCC) Continue on statin and ASA therapy  5. Major depressive disorder, recurrent, in remission, unspecified (HCC) Continue with fluoxetine, requesting refills - FLUoxetine (PROZAC) 20 MG capsule; Take 2 capsules (40 mg total) by mouth daily.  Dispense: 90 capsule; Refill: 1  6. Primary insomnia Continue with lorazepam as needed for insomnia [place] Controlled Substance Database was  reviewed by me for overdose risk score (ORS) - LORazepam (ATIVAN) 1 MG tablet; Take 1 tablet (1 mg total) by mouth at bedtime.  Dispense: 30 tablet;  Refill: 1  General Counseling: Deandre verbalizes understanding of the findings of todays visit and agrees with plan of treatment. I have discussed any further diagnostic evaluation that may be needed or ordered today. We also reviewed her medications today. she has been encouraged to call the office with any questions or concerns that should arise related to todays visit.    Orders Placed This Encounter  Procedures  . Ambulatory referral to Vascular Surgery    Meds ordered this encounter  Medications  . FLUoxetine (PROZAC) 20 MG capsule    Sig: Take 2 capsules (40 mg total) by mouth daily.    Dispense:  90 capsule    Refill:  1  . LORazepam (ATIVAN) 1 MG tablet    Sig: Take 1 tablet (1 mg total) by mouth at bedtime.    Dispense:  30 tablet    Refill:  1    Time spent: 30 Minutes Time spent includes review of chart, medications, test results and follow-up plan with the patient.  This patient was seen by Leeanne Deed AGNP-C in Collaboration with Dr Lyndon Code as a part of collaborative care agreement     Lubertha Basque. Arriyana Rodell AGNP-C Internal medicine

## 2020-07-05 ENCOUNTER — Other Ambulatory Visit: Payer: Self-pay

## 2020-07-05 DIAGNOSIS — J449 Chronic obstructive pulmonary disease, unspecified: Secondary | ICD-10-CM

## 2020-07-05 MED ORDER — ALBUTEROL SULFATE HFA 108 (90 BASE) MCG/ACT IN AERS: 2.0000 | INHALATION_SPRAY | Freq: Four times a day (QID) | RESPIRATORY_TRACT | 1 refills | Status: DC | PRN

## 2020-07-05 MED ORDER — ALBUTEROL SULFATE HFA 108 (90 BASE) MCG/ACT IN AERS
INHALATION_SPRAY | RESPIRATORY_TRACT | 3 refills | Status: DC
Start: 2020-07-05 — End: 2021-01-02

## 2020-07-06 ENCOUNTER — Encounter: Payer: Self-pay | Admitting: Hospice and Palliative Medicine

## 2020-07-22 ENCOUNTER — Ambulatory Visit: Payer: Medicare HMO | Admitting: Hospice and Palliative Medicine

## 2020-07-25 DIAGNOSIS — J449 Chronic obstructive pulmonary disease, unspecified: Secondary | ICD-10-CM | POA: Diagnosis not present

## 2020-07-27 DIAGNOSIS — R0602 Shortness of breath: Secondary | ICD-10-CM | POA: Diagnosis not present

## 2020-08-12 ENCOUNTER — Encounter (INDEPENDENT_AMBULATORY_CARE_PROVIDER_SITE_OTHER): Payer: Medicare Other | Admitting: Vascular Surgery

## 2020-08-16 ENCOUNTER — Other Ambulatory Visit: Payer: Self-pay | Admitting: *Deleted

## 2020-08-16 NOTE — Patient Instructions (Signed)
Goals Addressed            This Visit's Progress   . [facility]) Patient will verbalize continuation of following COPD color zones and resuce action plans for the next 90 days.       Timeframe:  Long-Range Goal Priority:  High Start Date:  02/26/20                           Expected End Date: 03/16/21                    Follow Up Date 11/14/20   - develop a rescue plan - follow rescue plan if symptoms flare-up - keep follow-up appointments  -Encouraged patient to continue to follow COPD color zones and rescue action plans   Why is this important?   Tracking your symptoms and other information about your health helps your doctor plan your care.  Write down the symptoms, the time of day, what you were doing and what medicine you are taking.  You will soon learn how to manage your symptoms.     Notes: Patient reports quickly contacting her provider with increased COPD symptoms. Nurse will send patient COPD education that includes zones and action plans.   Updated 08/16/20: Patient reports receiving [facility] COPD education and states she does use it to track her symptoms and follows the rescue action plans.     Marland Kitchen [facility]) Patient will verbalize continuation of managing her fatigue for the next 90 days       Timeframe:  Long-Range Goal Priority:  High Start Date: 02/26/20                            Expected End Date:  03/16/21                   Follow Up Date 11/14/20   - eat healthy - get at least 7 to 8 hours of sleep at night - get outdoors every day (weather permitting)   -Encouraged patient to continue to walk daily to maintain strength and mobility -Discussed resting as needed and getting adequate sleep  -Encouraged continuation of drinking supplements to ensure patient receives adequate nutrition  Why is this important?   Feeling tired or worn out is a common symptom of COPD (chronic obstructive pulmonary disease).  Learning when you feel your best and when you need rest is important.   Managing the tiredness (fatigue) will help you be active and enjoy life.     Notes: Patient reports her COPD is at baseline currently. At this time patient's fatigue is managed pretty well. Patient reports walking routinely to maintain her strength and overall health.   Updated 08/16/20: Patient reports getting adequate sleep at night and she does rest during the day as needed. Patient continues to walk routinely to help with expanding her lungs, to maintain strength, and to maintain mobility. Nurse will send patient Ensure coupons.

## 2020-08-16 NOTE — Patient Outreach (Signed)
[facility]) Care Management  [facility] Care Manager  08/16/2020   [name] 09-17-1945 [id number]  Subjective: Successful telephone outreach call to patient. HIPAA identifiers obtained. Patient shared that her current apartment complex changed ownership and she is being forced to move due to they will no longer allow subsidized tenants. She currently has a case worker from [employer] housing that is assisting her to relocate. This has been stressful but the patient states she is feeling supported by her case workers and family and emotionally she is doing pretty well. Patient reports that her COPD is at baseline. Patient continues to use continuous oxygen at 2 liters via nasal cannula and she is following the [facility] COPD color zones and rescue action plans she received previously. She reports walking short distances routinely and not being sedentary for long periods. Patient states that her appetite is decreased. She continues to drink nutritional supplements, reports she is maintaining her weight, and she is staying hydrated. Patient did not have any further questions or concerns today and did confirm that she has this nurse's contact number to call her if needed.   Encounter Medications:  Outpatient Encounter Medications as of 08/16/2020  Medication Sig Note  . acetaminophen (TYLENOL) 500 MG tablet Take 1,000 mg by mouth in the morning and at bedtime.    Marland Kitchen albuterol (PROAIR HFA) 108 (90 Base) MCG/ACT inhaler INHALE 2 PUFFS INTO LUNGS EVERY 6 HOURS AS NEEDED FOR WHEEZING OR SHORTNESS OF BREATH   . albuterol (VENTOLIN HFA) 108 (90 Base) MCG/ACT inhaler Inhale 2 puffs into the lungs every 6 (six) hours as needed for wheezing or shortness of breath.   Marland Kitchen alendronate (FOSAMAX) 70 MG tablet Take 70 mg by mouth every Tuesday.    Marland Kitchen aspirin EC 81 MG tablet Take 81 mg by mouth daily. Swallow whole.   . Azelastine HCl 137 MCG/SPRAY SOLN Place 2 sprays into both nostrils in the morning and at bedtime.     . Cyanocobalamin (VITAMIN B-12) 1000 MCG/15ML LIQD Take 1,200 mcg by mouth daily.   Marland Kitchen FLUoxetine (PROZAC) 20 MG capsule Take 2 capsules (40 mg total) by mouth daily.   . fluticasone (FLONASE) 50 MCG/ACT nasal spray Place 2 sprays into both nostrils in the morning and at bedtime.    Marland Kitchen guaifenesin (HUMIBID E) 400 MG TABS tablet Take 800 mg by mouth every 6 (six) hours as needed (congestion/cough).    . hydrocortisone 2.5 % cream Place 1 application rectally 2 (two) times daily as needed (itching/hemorroids).    Marland Kitchen ipratropium-albuterol (DUONEB) 0.5-2.5 (3) MG/3ML SOLN One vial 3 x  aday for copd with hypoxia (Patient taking differently: Inhale 3 mLs into the lungs in the morning, at noon, and at bedtime.) 05/22/2020: Patient states she takes twice daily  . levothyroxine (SYNTHROID) 88 MCG tablet Take 88 mcg by mouth daily before breakfast.   . LORazepam (ATIVAN) 1 MG tablet Take 1 tablet (1 mg total) by mouth at bedtime.   . Multiple Vitamin (MULTIVITAMIN WITH MINERALS) TABS tablet Take 1 tablet by mouth daily. Multivitamin for Seniors   . rosuvastatin (CRESTOR) 20 MG tablet Take 1 tablet (20 mg total) by mouth daily.   . SYMBICORT 160-4.5 MCG/ACT inhaler Inhale 2 puffs into the lungs 2 (two) times daily.   Marland Kitchen tiotropium (SPIRIVA) 18 MCG inhalation capsule Place 18 mcg into inhaler and inhale at bedtime. Reported on 08/07/2015    No facility-administered encounter medications on file as of 08/16/2020.    Functional Status:  In your present state of health, do you have any difficulty performing the following activities: 04/08/2020 03/12/2020  Hearing? N N  Vision? N Y  Difficulty concentrating or making decisions? N -  Walking or climbing stairs? Y Y  Comment - -  Dressing or bathing? Y Y  Comment occ.trouble; personal care aide comes to help pt. -  Doing errands, shopping? - Y  Comment - -  Preparing Food and eating ? - -  Comment - -  Using the Toilet? - -  In the past six months, have you  accidently leaked urine? - -  Do you have problems with loss of bowel control? - -  Managing your Medications? - -  Managing your Finances? - -  Housekeeping or managing your Housekeeping? - -  Comment - -  Some recent data might be hidden    Fall/Depression Screening: Fall Risk  05/22/2020 05/22/2020 05/22/2020  Falls in the past year? 0 0 0  Comment - - -  Number falls in past yr: 0 0 -  Comment - - -  Injury with Fall? 0 0 -  Comment - - -  Risk for fall due to : History of fall(s);Impaired balance/gait;Impaired mobility History of fall(s) -  Follow up Falls evaluation completed;Education provided;Falls prevention discussed Falls prevention discussed;Education provided;Falls evaluation completed -   PHQ 2/9 Scores 05/22/2020 05/22/2020 03/12/2020 02/27/2020 02/26/2020 12/13/2019 09/20/2019  PHQ - 2 Score 0 0 0 0 0 0 0  PHQ- 9 Score - - - - - - -  Exception Documentation - - - - - - -    Assessment:  Goals Addressed            This Visit's Progress   . [facility]) Patient will verbalize continuation of following COPD color zones and resuce action plans for the next 90 days.       Timeframe:  Long-Range Goal Priority:  High Start Date:  02/26/20                           Expected End Date: 03/16/21                    Follow Up Date 11/14/20   - develop a rescue plan - follow rescue plan if symptoms flare-up - keep follow-up appointments  -Encouraged patient to continue to follow COPD color zones and rescue action plans   Why is this important?   Tracking your symptoms and other information about your health helps your doctor plan your care.  Write down the symptoms, the time of day, what you were doing and what medicine you are taking.  You will soon learn how to manage your symptoms.     Notes: Patient reports quickly contacting her provider with increased COPD symptoms. Nurse will send patient COPD education that includes zones and action plans.   Updated 08/16/20: Patient reports  receiving [facility] COPD education and states she does use it to track her symptoms and follows the rescue action plans.     Marland Kitchen [facility]) Patient will verbalize continuation of managing her fatigue for the next 90 days       Timeframe:  Long-Range Goal Priority:  High Start Date: 02/26/20                            Expected End Date:  03/16/21  Follow Up Date 11/14/20   - eat healthy - get at least 7 to 8 hours of sleep at night - get outdoors every day (weather permitting)   -Encouraged patient to continue to walk daily to maintain strength and mobility -Discussed resting as needed and getting adequate sleep  -Encouraged continuation of drinking supplements to ensure patient receives adequate nutrition  Why is this important?   Feeling tired or worn out is a common symptom of COPD (chronic obstructive pulmonary disease).  Learning when you feel your best and when you need rest is important.  Managing the tiredness (fatigue) will help you be active and enjoy life.     Notes: Patient reports her COPD is at baseline currently. At this time patient's fatigue is managed pretty well. Patient reports walking routinely to maintain her strength and overall health.   Updated 08/16/20: Patient reports getting adequate sleep at night and she does rest during the day as needed. Patient continues to walk routinely to help with expanding her lungs, to maintain strength, and to maintain mobility. Nurse will send patient Ensure coupons.      Plan: RN Health Coach will send PCP a quarterly update, will send patient Ensure coupons, and will call patient within the month of June.  Follow-up:  Patient agrees to Care Plan and Follow-up.  [name] RN, BSN [facility] Care Management  RN Health Coach [phone] [email or web address]

## 2020-08-25 DIAGNOSIS — J449 Chronic obstructive pulmonary disease, unspecified: Secondary | ICD-10-CM | POA: Diagnosis not present

## 2020-08-27 DIAGNOSIS — R0602 Shortness of breath: Secondary | ICD-10-CM | POA: Diagnosis not present

## 2020-08-28 ENCOUNTER — Ambulatory Visit (INDEPENDENT_AMBULATORY_CARE_PROVIDER_SITE_OTHER): Payer: Medicare Other | Admitting: Hospice and Palliative Medicine

## 2020-08-28 ENCOUNTER — Other Ambulatory Visit: Payer: Self-pay

## 2020-08-28 ENCOUNTER — Encounter: Payer: Self-pay | Admitting: Hospice and Palliative Medicine

## 2020-08-28 VITALS — BP 138/74 | HR 93 | Temp 97.1°F | Resp 16 | Ht 66.0 in | Wt 122.0 lb

## 2020-08-28 DIAGNOSIS — M810 Age-related osteoporosis without current pathological fracture: Secondary | ICD-10-CM

## 2020-08-28 DIAGNOSIS — I10 Essential (primary) hypertension: Secondary | ICD-10-CM | POA: Diagnosis not present

## 2020-08-28 DIAGNOSIS — R29898 Other symptoms and signs involving the musculoskeletal system: Secondary | ICD-10-CM | POA: Diagnosis not present

## 2020-08-28 DIAGNOSIS — J9611 Chronic respiratory failure with hypoxia: Secondary | ICD-10-CM | POA: Diagnosis not present

## 2020-08-28 DIAGNOSIS — E119 Type 2 diabetes mellitus without complications: Secondary | ICD-10-CM | POA: Diagnosis not present

## 2020-08-28 DIAGNOSIS — J449 Chronic obstructive pulmonary disease, unspecified: Secondary | ICD-10-CM

## 2020-08-28 DIAGNOSIS — R0902 Hypoxemia: Secondary | ICD-10-CM

## 2020-08-28 DIAGNOSIS — F5101 Primary insomnia: Secondary | ICD-10-CM | POA: Diagnosis not present

## 2020-08-28 LAB — POCT GLYCOSYLATED HEMOGLOBIN (HGB A1C): Hemoglobin A1C: 5.5 % (ref 4.0–5.6)

## 2020-08-28 MED ORDER — LORAZEPAM 1 MG PO TABS: 1.0000 mg | ORAL_TABLET | Freq: Every day | ORAL | 1 refills | Status: DC

## 2020-08-28 NOTE — Progress Notes (Signed)
[facility] [address] [place], [place] [address]  Internal MEDICINE  Office Visit Note  Patient Name: [name]  [id number]  [id number]  Date of Service: 08/31/2020  Chief Complaint  Patient presents with  . Follow-up    Discuss allergies, motorized wheel chair, oxygen, refill request   . Depression  . Diabetes  . Hypertension  . COPD  . Anxiety  . Quality Metric Gaps    Colonoscopy, dexa, eye exam, foot exam    HPI Patient is here for routine follow-up Under quite a bit of stress as she was recently told she had to move, in the process of packing up her things, must be moved into her new place in a few weeks Will reschedule eye exam and vascular appointment after her move is complete  Anxiety is much improved since using lorazepam at night as needed-sleeping well Continues with wearing supplemental oxygen during the day as well as at night  Due for screening colonoscopy but at this time she declines testing  DME oxygen company delivered portable oxygen tanks, they are now heavy requiring her to use a walker to carry her belongings She will be moving into an apartment that is on the 3rd floor but does have access to an elevator--she will be required to walk a much further distance than previous  Foot exam completed today  Current Medication: Outpatient Encounter Medications as of 08/28/2020  Medication Sig Note  . acetaminophen (TYLENOL) 500 MG tablet Take 1,000 mg by mouth in the morning and at bedtime.    Marland Kitchen albuterol (PROAIR HFA) 108 (90 Base) MCG/ACT inhaler INHALE 2 PUFFS INTO LUNGS EVERY 6 HOURS AS NEEDED FOR WHEEZING OR SHORTNESS OF BREATH   . albuterol (VENTOLIN HFA) 108 (90 Base) MCG/ACT inhaler Inhale 2 puffs into the lungs every 6 (six) hours as needed for wheezing or shortness of breath.   Marland Kitchen alendronate (FOSAMAX) 70 MG tablet Take 70 mg by mouth every Tuesday.    Marland Kitchen aspirin EC 81 MG tablet Take 81 mg by mouth daily. Swallow whole.   .  Azelastine HCl 137 MCG/SPRAY SOLN Place 2 sprays into both nostrils in the morning and at bedtime.    . Cyanocobalamin (VITAMIN B-12) 1000 MCG/15ML LIQD Take 1,200 mcg by mouth daily.   Marland Kitchen FLUoxetine (PROZAC) 20 MG capsule Take 2 capsules (40 mg total) by mouth daily.   . fluticasone (FLONASE) 50 MCG/ACT nasal spray Place 2 sprays into both nostrils in the morning and at bedtime.    Marland Kitchen guaifenesin (HUMIBID E) 400 MG TABS tablet Take 800 mg by mouth every 6 (six) hours as needed (congestion/cough).    . hydrocortisone 2.5 % cream Place 1 application rectally 2 (two) times daily as needed (itching/hemorroids).    Marland Kitchen ipratropium-albuterol (DUONEB) 0.5-2.5 (3) MG/3ML SOLN One vial 3 x  aday for copd with hypoxia (Patient taking differently: Inhale 3 mLs into the lungs in the morning, at noon, and at bedtime.) 05/22/2020: Patient states she takes twice daily  . levothyroxine (SYNTHROID) 88 MCG tablet Take 88 mcg by mouth daily before breakfast.   . LORazepam (ATIVAN) 1 MG tablet Take 1 tablet (1 mg total) by mouth at bedtime.   . Multiple Vitamin (MULTIVITAMIN WITH MINERALS) TABS tablet Take 1 tablet by mouth daily. Multivitamin for Seniors   . rosuvastatin (CRESTOR) 20 MG tablet Take 1 tablet (20 mg total) by mouth daily.   . SYMBICORT 160-4.5 MCG/ACT inhaler Inhale 2 puffs into the lungs 2 (two)  times daily.   Marland Kitchen tiotropium (SPIRIVA) 18 MCG inhalation capsule Place 18 mcg into inhaler and inhale at bedtime. Reported on 08/07/2015   . [DISCONTINUED] LORazepam (ATIVAN) 1 MG tablet Take 1 tablet (1 mg total) by mouth at bedtime.    No facility-administered encounter medications on file as of 08/28/2020.    Surgical History: Past Surgical History:  Procedure Laterality Date  . ABDOMINAL HYSTERECTOMY    . ESOPHAGOGASTRODUODENOSCOPY (EGD) WITH PROPOFOL N/A 01/10/2015   Procedure: ESOPHAGOGASTRODUODENOSCOPY (EGD) WITH PROPOFOL;  Surgeon: Wallace Cullens, MD;  Location: [facility] ENDOSCOPY;  Service: Gastroenterology;   Laterality: N/A;  . RECTAL SURGERY    . RIGHT/LEFT HEART CATH AND CORONARY ANGIOGRAPHY Bilateral 04/08/2020   Procedure: RIGHT/LEFT HEART CATH AND CORONARY ANGIOGRAPHY;  Surgeon: Iran Ouch, MD;  Location: [facility] INVASIVE CV LAB;  Service: Cardiovascular;  Laterality: Bilateral;  . SAVORY DILATION N/A 01/10/2015   Procedure: SAVORY DILATION;  Surgeon: Wallace Cullens, MD;  Location: [facility] ENDOSCOPY;  Service: Gastroenterology;  Laterality: N/A;  . toenail removal Bilateral    great toes  . TONSILLECTOMY AND ADENOIDECTOMY      Medical History: Past Medical History:  Diagnosis Date  . Anxiety   . Chest pain   . CHF (congestive heart failure) (HCC)   . COPD (chronic obstructive pulmonary disease) (HCC)   . Depression   . Diabetes mellitus without complication (HCC)   . Fibromyalgia   . Graves disease   . Hypertension   . IBS (irritable bowel syndrome)   . PTSD (post-traumatic stress disorder)   . Sinus problem   . Spinal stenosis     Family History: Family History  Problem Relation Age of Onset  . Alcohol abuse Mother   . Depression Mother   . Varicose Veins Mother   . Alcohol abuse Father   . Depression Father   . Alcohol abuse Sister   . Asthma Sister   . COPD Sister   . Depression Sister   . Heart disease Sister   . Hypertension Sister   . Diabetes Sister   . Depression Maternal Grandmother   . Diabetes Paternal Grandmother   . Stroke Paternal Grandmother     Social History   Socioeconomic History  . Marital status: Widowed    Spouse name: Not on file  . Number of children: 2  . Years of education: Not on file  . Highest education level: Not on file  Occupational History  . Not on file  Tobacco Use  . Smoking status: Former Smoker    Packs/day: 0.50    Years: 25.00    Pack years: 12.50    Types: Cigarettes    Quit date: 06/03/2016    Years since quitting: 4.2  . Smokeless tobacco: Never Used  Vaping Use  . Vaping Use: Never used  Substance and Sexual  Activity  . Alcohol use: No    Alcohol/week: 0.0 standard drinks  . Drug use: No  . Sexual activity: Not Currently  Other Topics Concern  . Not on file  Social History Narrative   Lives by herself; 1 daughter Marcelino Duster in [place]: 1 daughter in     Social Determinants of Health   Financial Resource Strain: Low Risk   . Difficulty of Paying Living Expenses: Not hard at all  Food Insecurity: No Food Insecurity  . Worried About Programme researcher, broadcasting/film/video in the Last Year: Never true  . Ran Out of Food in the Last Year: Never true  Transportation Needs:  No Transportation Needs  . Lack of Transportation (Medical): No  . Lack of Transportation (Non-Medical): No  Physical Activity: Not on file  Stress: Not on file  Social Connections: Not on file  Intimate Partner Violence: Not At Risk  . Fear of Current or Ex-Partner: No  . Emotionally Abused: No  . Physically Abused: No  . Sexually Abused: No      Review of Systems  Constitutional: Negative for chills, diaphoresis and fatigue.  HENT: Negative for ear pain, postnasal drip and sinus pressure.   Eyes: Negative for photophobia, discharge, redness, itching and visual disturbance.  Respiratory: Negative for cough, shortness of breath and wheezing.   Cardiovascular: Negative for chest pain, palpitations and leg swelling.  Gastrointestinal: Negative for abdominal pain, constipation, diarrhea, nausea and vomiting.  Genitourinary: Negative for dysuria and flank pain.  Musculoskeletal: Negative for arthralgias, back pain, gait problem and neck pain.  Skin: Negative for color change.  Allergic/Immunologic: Negative for environmental allergies and food allergies.  Neurological: Negative for dizziness and headaches.  Hematological: Does not bruise/bleed easily.  Psychiatric/Behavioral: Negative for agitation, behavioral problems (depression) and hallucinations.    Vital Signs: BP 138/74   Pulse 93   Temp (!) 97.1 F (36.2 C)    Resp 16   Ht 5\' 6"  (1.676 m)   Wt 122 lb (55.3 kg)   SpO2 (!) 80% Comment: room air, 94% 3L  BMI 19.69 kg/m    Physical Exam Vitals reviewed.  Constitutional:      Appearance: Normal appearance. She is normal weight.  Cardiovascular:     Rate and Rhythm: Normal rate and regular rhythm.     Pulses: Normal pulses.          Dorsalis pedis pulses are 2+ on the right side and 2+ on the left side.       Posterior tibial pulses are 2+ on the right side and 2+ on the left side.     Heart sounds: Normal heart sounds.  Pulmonary:     Effort: Pulmonary effort is normal.     Breath sounds: Normal breath sounds.  Abdominal:     General: Abdomen is flat.     Palpations: Abdomen is soft.  Musculoskeletal:        General: Normal range of motion.     Cervical back: Normal range of motion.  Feet:     Right foot:     Protective Sensation: 5 sites tested. 5 sites sensed.     Skin integrity: Skin integrity normal.     Toenail Condition: Right toenails are normal.     Left foot:     Protective Sensation: 5 sites tested. 5 sites sensed.     Skin integrity: Skin integrity normal.     Toenail Condition: Left toenails are normal.  Skin:    General: Skin is warm.  Neurological:     General: No focal deficit present.     Mental Status: She is alert and oriented to person, place, and time. Mental status is at baseline.     Comments: Bilateral upper extremity strength 3/5 Bilateral lower extremity strength 3/5  Psychiatric:        Mood and Affect: Mood normal.        Behavior: Behavior normal.        Thought Content: Thought content normal.        Judgment: Judgment normal.    Assessment/Plan: 1. Diet-controlled type 2 diabetes mellitus (HCC) A1C 5.5 remains well controlled, continue with healthy  eating options for glucose control - POCT HgB A1C  2. Essential hypertension BP and HR remain well controlled on present management, continue to monitor  3. COPD with hypoxia (HCC) Continue with  all supportive measures Benefit from motorized wheelchair to assist with ambulation and heavy required oxygen equipment - For home use only DME Other see comment  4. Chronic respiratory failure with hypoxia (HCC) Continue with all supportive measures Benefit from motorized wheelchair to assist with ambulation and heavy required oxygen equipment - For home use only DME Other see comment  5. Weakness of both lower extremities Will benefit from motorized wheelchair due to weakness and prevent falls and possible fracture - For home use only DME Other see comment  6. Age-related osteoporosis without current pathological fracture Associated weakness, will benefit from motorized wheelchair due to weakness and prevent falls and possible fracture - For home use only DME Other see comment  7. Primary insomnia Stable, continue with current low dose lorazepam as needed for insomnia - LORazepam (ATIVAN) 1 MG tablet; Take 1 tablet (1 mg total) by mouth at bedtime.  Dispense: 30 tablet; Refill: 1  General Counseling: Graelyn verbalizes understanding of the findings of todays visit and agrees with plan of treatment. I have discussed any further diagnostic evaluation that may be needed or ordered today. We also reviewed her medications today. she has been encouraged to call the office with any questions or concerns that should arise related to todays visit.    Orders Placed This Encounter  Procedures  . For home use only DME Other see comment  . POCT HgB A1C    Meds ordered this encounter  Medications  . LORazepam (ATIVAN) 1 MG tablet    Sig: Take 1 tablet (1 mg total) by mouth at bedtime.    Dispense:  30 tablet    Refill:  1    Time spent: 30 Minutes Time spent includes review of chart, medications, test results and follow-up plan with the patient.  This patient was seen by Leeanne Deedaylor Eleana Tocco AGNP-C in Collaboration with Dr Lyndon CodeFozia M Khan as a part of collaborative care agreement     Lubertha Basqueaylor S.  Selicia Windom AGNP-C Internal medicine

## 2020-08-31 ENCOUNTER — Encounter: Payer: Self-pay | Admitting: Hospice and Palliative Medicine

## 2020-09-04 ENCOUNTER — Telehealth: Payer: Self-pay

## 2020-09-04 NOTE — Telephone Encounter (Signed)
Faxed to [employer] for powerwheelchair

## 2020-09-09 ENCOUNTER — Encounter (INDEPENDENT_AMBULATORY_CARE_PROVIDER_SITE_OTHER): Payer: Medicare Other | Admitting: Vascular Surgery

## 2020-09-09 ENCOUNTER — Telehealth: Payer: Self-pay

## 2020-09-09 NOTE — Telephone Encounter (Signed)
Per request from [facility], 08/28/20 office notes faxed to [phone] for power wheelchair-Toni

## 2020-09-10 ENCOUNTER — Telehealth: Payer: Self-pay

## 2020-09-11 DIAGNOSIS — J449 Chronic obstructive pulmonary disease, unspecified: Secondary | ICD-10-CM | POA: Diagnosis not present

## 2020-09-24 DIAGNOSIS — J449 Chronic obstructive pulmonary disease, unspecified: Secondary | ICD-10-CM | POA: Diagnosis not present

## 2020-09-26 DIAGNOSIS — R0602 Shortness of breath: Secondary | ICD-10-CM | POA: Diagnosis not present

## 2020-10-03 ENCOUNTER — Encounter (INDEPENDENT_AMBULATORY_CARE_PROVIDER_SITE_OTHER): Payer: Medicare Other | Admitting: Vascular Surgery

## 2020-10-17 ENCOUNTER — Other Ambulatory Visit: Payer: Self-pay | Admitting: *Deleted

## 2020-10-17 NOTE — Patient Outreach (Addendum)
[facility]) Care Management  10/17/2020  [name] 1946/01/16 [id number]  Unsuccessful outreach attempt made to patient. RN Health Coach left HIPAA compliant voicemail message along with her contact information.  Plan: RN Health Coach will call patient within the month of July.  [name] RN, BSN [facility] Care Management  RN Health Coach [phone] [name]

## 2020-10-25 DIAGNOSIS — J449 Chronic obstructive pulmonary disease, unspecified: Secondary | ICD-10-CM | POA: Diagnosis not present

## 2020-10-27 DIAGNOSIS — R0602 Shortness of breath: Secondary | ICD-10-CM | POA: Diagnosis not present

## 2020-10-28 ENCOUNTER — Encounter (INDEPENDENT_AMBULATORY_CARE_PROVIDER_SITE_OTHER): Payer: Medicare Other | Admitting: Vascular Surgery

## 2020-10-29 ENCOUNTER — Encounter: Payer: Self-pay | Admitting: Internal Medicine

## 2020-10-29 ENCOUNTER — Telehealth: Payer: Self-pay

## 2020-10-29 ENCOUNTER — Ambulatory Visit (INDEPENDENT_AMBULATORY_CARE_PROVIDER_SITE_OTHER): Payer: Medicare Other | Admitting: Internal Medicine

## 2020-10-29 ENCOUNTER — Other Ambulatory Visit: Payer: Self-pay

## 2020-10-29 VITALS — BP 138/56 | HR 93 | Temp 97.1°F | Resp 16 | Ht 66.0 in | Wt 118.4 lb

## 2020-10-29 DIAGNOSIS — J9611 Chronic respiratory failure with hypoxia: Secondary | ICD-10-CM | POA: Diagnosis not present

## 2020-10-29 DIAGNOSIS — Z9981 Dependence on supplemental oxygen: Secondary | ICD-10-CM

## 2020-10-29 DIAGNOSIS — F3340 Major depressive disorder, recurrent, in remission, unspecified: Secondary | ICD-10-CM | POA: Diagnosis not present

## 2020-10-29 DIAGNOSIS — F5101 Primary insomnia: Secondary | ICD-10-CM

## 2020-10-29 DIAGNOSIS — M159 Polyosteoarthritis, unspecified: Secondary | ICD-10-CM

## 2020-10-29 DIAGNOSIS — M8949 Other hypertrophic osteoarthropathy, multiple sites: Secondary | ICD-10-CM

## 2020-10-29 MED ORDER — TRAMADOL HCL 50 MG PO TABS: ORAL_TABLET | ORAL | 3 refills | Status: DC

## 2020-10-29 MED ORDER — LORAZEPAM 1 MG PO TABS: 1.0000 mg | ORAL_TABLET | Freq: Every day | ORAL | 2 refills | Status: DC

## 2020-10-29 MED ORDER — FLUOXETINE HCL 20 MG PO CAPS: 40.0000 mg | ORAL_CAPSULE | Freq: Every day | ORAL | 1 refills | Status: DC

## 2020-10-29 MED ORDER — MIRTAZAPINE 7.5 MG PO TABS: 7.5000 mg | ORAL_TABLET | Freq: Every day | ORAL | 3 refills | Status: DC

## 2020-10-29 NOTE — Progress Notes (Addendum)
[facility] [address] [place], [place] [address]  Internal MEDICINE  Office Visit Note  Patient Name: Danielle KaysBetty C Poole  [id number] 03, 2047  [id number]  Date of Service: 10/29/2020  Chief Complaint  Patient presents with   Follow-up    Paper work, refills, discuss anxiety, depression, and sleep   Depression   Diabetes   Hypertension   COPD   Anxiety   Quality Metric Gaps    Shingrix, colonoscopy, dexa, covid booster, eye exam, foot exam was done     HPI Patient is here for routine follow-up along with her paperwork for power wheelchair and needs a mobility examination. Patient has multiple medical problems including end-stage COPD oxygen dependent.  She has problems ambulating and gets short of breath. PMD is necessary to go to the bathroom for toilet use and bathe  PMD also necessary to go to the kitchen and prepare meals for herself.  PMD will help mentally to be able to move in her residence including to groom and dress herself. It will help her mentally and emotionally  Patient also has severe osteoarthritis and has difficulty holding heavy objects. Recently she has changed her residence and requires assistance with her ADLs. Patient is unable to use a cane or a walker due to use of oxygen and instability and risk of falls Patient is unable to use a manual chair due to lower strength in her upper extremities patient is complaining of pain in her hands due to OA She will be able to use PMD safely,  patient is willing and motivated to use a device in her residence  Unable to accommodate scooter in her house , maneuvering is difficult  Pt is not a diabetic any more.  Current Medication: Outpatient Encounter Medications as of 10/29/2020  Medication Sig Note   mirtazapine (REMERON) 7.5 MG tablet Take 1 tablet (7.5 mg total) by mouth at bedtime.    traMADol (ULTRAM) 50 MG tablet Take one tab po bid prn for osteoarthritis    acetaminophen (TYLENOL) 500 MG tablet Take 1,000 mg  by mouth in the morning and at bedtime.     albuterol (PROAIR HFA) 108 (90 Base) MCG/ACT inhaler INHALE 2 PUFFS INTO LUNGS EVERY 6 HOURS AS NEEDED FOR WHEEZING OR SHORTNESS OF BREATH    albuterol (VENTOLIN HFA) 108 (90 Base) MCG/ACT inhaler Inhale 2 puffs into the lungs every 6 (six) hours as needed for wheezing or shortness of breath.    alendronate (FOSAMAX) 70 MG tablet Take 70 mg by mouth every Tuesday.     aspirin EC 81 MG tablet Take 81 mg by mouth daily. Swallow whole.    Azelastine HCl 137 MCG/SPRAY SOLN Place 2 sprays into both nostrils in the morning and at bedtime.     Cyanocobalamin (VITAMIN B-12) 1000 MCG/15ML LIQD Take 1,200 mcg by mouth daily.    FLUoxetine (PROZAC) 20 MG capsule Take 2 capsules (40 mg total) by mouth daily.    fluticasone (FLONASE) 50 MCG/ACT nasal spray Place 2 sprays into both nostrils in the morning and at bedtime.     guaifenesin (HUMIBID E) 400 MG TABS tablet Take 800 mg by mouth every 6 (six) hours as needed (congestion/cough).     hydrocortisone 2.5 % cream Place 1 application rectally 2 (two) times daily as needed (itching/hemorroids).     ipratropium-albuterol (DUONEB) 0.5-2.5 (3) MG/3ML SOLN One vial 3 x  aday for copd with hypoxia (Patient taking differently: Inhale 3 mLs into the lungs in the morning, at  noon, and at bedtime.) 05/22/2020: Patient states she takes twice daily   levothyroxine (SYNTHROID) 88 MCG tablet Take 88 mcg by mouth daily before breakfast.    LORazepam (ATIVAN) 1 MG tablet Take 1 tablet (1 mg total) by mouth at bedtime.    Multiple Vitamin (MULTIVITAMIN WITH MINERALS) TABS tablet Take 1 tablet by mouth daily. Multivitamin for Seniors    rosuvastatin (CRESTOR) 20 MG tablet Take 1 tablet (20 mg total) by mouth daily.    SYMBICORT 160-4.5 MCG/ACT inhaler Inhale 2 puffs into the lungs 2 (two) times daily.    tiotropium (SPIRIVA) 18 MCG inhalation capsule Place 18 mcg into inhaler and inhale at bedtime. Reported on 08/07/2015     [DISCONTINUED] FLUoxetine (PROZAC) 20 MG capsule Take 2 capsules (40 mg total) by mouth daily.    [DISCONTINUED] LORazepam (ATIVAN) 1 MG tablet Take 1 tablet (1 mg total) by mouth at bedtime.    No facility-administered encounter medications on file as of 10/29/2020.    Surgical History: Past Surgical History:  Procedure Laterality Date   ABDOMINAL HYSTERECTOMY     ESOPHAGOGASTRODUODENOSCOPY (EGD) WITH PROPOFOL N/A 01/10/2015   Procedure: ESOPHAGOGASTRODUODENOSCOPY (EGD) WITH PROPOFOL;  Surgeon: Wallace Cullens, MD;  Location: [facility] ENDOSCOPY;  Service: Gastroenterology;  Laterality: N/A;   RECTAL SURGERY     RIGHT/LEFT HEART CATH AND CORONARY ANGIOGRAPHY Bilateral 04/08/2020   Procedure: RIGHT/LEFT HEART CATH AND CORONARY ANGIOGRAPHY;  Surgeon: Iran Ouch, MD;  Location: [facility] INVASIVE CV LAB;  Service: Cardiovascular;  Laterality: Bilateral;   SAVORY DILATION N/A 01/10/2015   Procedure: SAVORY DILATION;  Surgeon: Wallace Cullens, MD;  Location: [facility] ENDOSCOPY;  Service: Gastroenterology;  Laterality: N/A;   toenail removal Bilateral    great toes   TONSILLECTOMY AND ADENOIDECTOMY      Medical History: Past Medical History:  Diagnosis Date   Anxiety    Chest pain    CHF (congestive heart failure) (HCC)    COPD (chronic obstructive pulmonary disease) (HCC)    Depression    Diabetes mellitus without complication (HCC)    Fibromyalgia    Graves disease    Hypertension    IBS (irritable bowel syndrome)    PTSD (post-traumatic stress disorder)    Sinus problem    Spinal stenosis     Family History: Family History  Problem Relation Age of Onset   Alcohol abuse Mother    Depression Mother    Varicose Veins Mother    Alcohol abuse Father    Depression Father    Alcohol abuse Sister    Asthma Sister    COPD Sister    Depression Sister    Heart disease Sister    Hypertension Sister    Diabetes Sister    Depression Maternal Grandmother    Diabetes Paternal Grandmother    Stroke  Paternal Grandmother     Social History   Socioeconomic History   Marital status: Widowed    Spouse name: Not on file   Number of children: 2   Years of education: Not on file   Highest education level: Not on file  Occupational History   Not on file  Tobacco Use   Smoking status: Former    Packs/day: 0.50    Years: 25.00    Pack years: 12.50    Types: Cigarettes    Quit date: 06/03/2016    Years since quitting: 4.4   Smokeless tobacco: Never  Vaping Use   Vaping Use: Never used  Substance and Sexual Activity  Alcohol use: No    Alcohol/week: 0.0 standard drinks   Drug use: No   Sexual activity: Not Currently  Other Topics Concern   Not on file  Social History Narrative   Lives by herself; 1 daughter Marcelino Duster in [place]: 1 daughter in [place]    Social Determinants of Health   Financial Resource Strain: Not on file  Food Insecurity: No Food Insecurity   Worried About Programme researcher, broadcasting/film/video in the Last Year: Never true   Barista in the Last Year: Never true  Transportation Needs: No Transportation Needs   Lack of Transportation (Medical): No   Lack of Transportation (Non-Medical): No  Physical Activity: Not on file  Stress: Not on file  Social Connections: Not on file  Intimate Partner Violence: Not on file      Review of Systems  Constitutional:  Positive for fatigue. Negative for chills and unexpected weight change.  HENT:  Positive for postnasal drip. Negative for congestion, rhinorrhea, sneezing and sore throat.   Eyes:  Negative for redness.  Respiratory:  Negative for cough, chest tightness and shortness of breath.   Cardiovascular:  Negative for chest pain and palpitations.  Gastrointestinal:  Negative for abdominal pain, constipation, diarrhea, nausea and vomiting.  Genitourinary:  Negative for dysuria and frequency.  Musculoskeletal:  Positive for arthralgias, gait problem and joint swelling. Negative for back pain and neck pain.  Skin:   Negative for rash.  Neurological:  Positive for weakness. Negative for tremors and numbness.  Hematological:  Negative for adenopathy. Does not bruise/bleed easily.  Psychiatric/Behavioral:  Positive for sleep disturbance. Negative for behavioral problems (Depression) and suicidal ideas. The patient is not nervous/anxious.    Vital Signs: BP (!) 138/56   Pulse 93   Temp (!) 97.1 F (36.2 C)   Resp 16   Ht 5\' 6"  (1.676 m)   Wt 118 lb 6.4 oz (53.7 kg)   SpO2 (!) 81% Comment: with 2L of oxygen and walking to the room, 94% while sitting for a few minutes  BMI 19.11 kg/m    Physical Exam Constitutional:      General: She is not in acute distress.    Appearance: She is well-developed. She is not diaphoretic.  HENT:     Head: Normocephalic and atraumatic.     Right Ear: External ear normal.     Left Ear: External ear normal.     Nose: Nose normal.     Mouth/Throat:     Pharynx: No oropharyngeal exudate.  Eyes:     General: No scleral icterus.       Right eye: No discharge.        Left eye: No discharge.     Conjunctiva/sclera: Conjunctivae normal.     Pupils: Pupils are equal, round, and reactive to light.  Neck:     Thyroid: No thyromegaly.     Vascular: No JVD.     Trachea: No tracheal deviation.     Comments: Patient has decreased range of motion in her neck Cardiovascular:     Rate and Rhythm: Normal rate and regular rhythm.     Heart sounds: Normal heart sounds. No murmur heard.   No friction rub. No gallop.  Pulmonary:     Effort: Pulmonary effort is normal. No respiratory distress.     Breath sounds: Normal breath sounds. No stridor. No wheezing or rales.     Comments: Patient takes pauses during conversation due to shortness of breath She does  have O2 with her Chest:     Chest wall: No tenderness.  Abdominal:     General: Bowel sounds are normal. There is no distension.     Palpations: Abdomen is soft. There is no mass.     Tenderness: There is no abdominal  tenderness. There is no guarding or rebound.  Musculoskeletal:        General: Swelling and deformity present. No tenderness.     Cervical back: Neck supple.     Right lower leg: Edema present.  Lymphadenopathy:     Cervical: No cervical adenopathy.  Skin:    General: Skin is warm and dry.     Coloration: Skin is not pale.     Findings: No erythema or rash.  Neurological:     Mental Status: She is alert.     Cranial Nerves: No cranial nerve deficit.     Motor: Weakness present. No abnormal muscle tone.     Coordination: Coordination abnormal.     Gait: Gait abnormal.     Deep Tendon Reflexes: Reflexes are normal and symmetric.     Comments: Decreased upper extremity strength 2/5, abnormal gait and imbalance  Decreased lower ext strength 2/5   Psychiatric:        Behavior: Behavior normal.        Thought Content: Thought content normal.        Judgment: Judgment normal.       Assessment/Plan: 1. Chronic respiratory failure with hypoxia [facility]) Patient has chronic respiratory failure with hypoxia due to end-stage COPD, she will continue her oxygen we will order a PMD for her to assist her with ADLs.  Patient meets all the criteria due to her medical condition and to help her to take care of her needs at home - For home use only DME Other see comment  2. Major depressive disorder, recurrent, in remission, unspecified (HCC) Will continue on Prozac 40 mg once a day by increasing her activity level which will also help with her depression - FLUoxetine (PROZAC) 20 MG capsule; Take 2 capsules (40 mg total) by mouth daily.  Dispense: 90 capsule; Refill: 1 - For home use only DME Other see comment  3. Primary insomnia We will increase her mirtazapine to 15 mg at bedtime to help with insomnia and take Ativan as needed - LORazepam (ATIVAN) 1 MG tablet; Take 1 tablet (1 mg total) by mouth at bedtime.  Dispense: 30 tablet; Refill: 2 - For home use only DME Other see comment  4. Oxygen  dependent Patient is oxygen dependent we will continue as before - mirtazapine (REMERON) 7.5 MG tablet; Take 1 tablet (7.5 mg total) by mouth at bedtime.  Dispense: 30 tablet; Refill: 3 - For home use only DME Other see comment  5. Primary osteoarthritis involving multiple joints Patient has osteoarthritis and will try Voltaren gel over-the-counter along with tramadol as needed Admits that she will be able to transfer from bed to POV Pt is taking frequent stops with her rolling walker and is very unsteady on her gai\t  - traMADol (ULTRAM) 50 MG tablet; Take one tab po bid prn for osteoarthritis  Dispense: 60 tablet; Refill: 3 - For home use only DME Other see comment   PMD rx is written for above mentioned conditions to help her with her ADL's  which will have  a positive impact on her mental, emotional and physical well being   General Counseling: Shylah verbalizes understanding of the findings of todays visit  and agrees with plan of treatment. I have discussed any further diagnostic evaluation that may be needed or ordered today. We also reviewed her medications today. she has been encouraged to call the office with any questions or concerns that should arise related to todays visit.    Orders Placed This Encounter  Procedures   For home use only DME Other see comment    Meds ordered this encounter  Medications   traMADol (ULTRAM) 50 MG tablet    Sig: Take one tab po bid prn for osteoarthritis    Dispense:  60 tablet    Refill:  3   FLUoxetine (PROZAC) 20 MG capsule    Sig: Take 2 capsules (40 mg total) by mouth daily.    Dispense:  90 capsule    Refill:  1   mirtazapine (REMERON) 7.5 MG tablet    Sig: Take 1 tablet (7.5 mg total) by mouth at bedtime.    Dispense:  30 tablet    Refill:  3   LORazepam (ATIVAN) 1 MG tablet    Sig: Take 1 tablet (1 mg total) by mouth at bedtime.    Dispense:  30 tablet    Refill:  2    Total time spent: 35  Minutes Time spent includes review  of chart, medications, test results, and follow up plan with the patient.   [place] Controlled Substance Database was reviewed by me.   Dr Lyndon Code Internal medicine

## 2020-10-29 NOTE — Telephone Encounter (Signed)
Faxed order for mobilized device to [facility] at [phone]. Copy of order placed in scan. Toni Amend

## 2020-11-01 ENCOUNTER — Telehealth: Payer: Self-pay

## 2020-11-01 NOTE — Telephone Encounter (Signed)
Faxed note today again with addendum note to hooveround

## 2020-11-07 ENCOUNTER — Encounter (INDEPENDENT_AMBULATORY_CARE_PROVIDER_SITE_OTHER): Payer: Medicare Other | Admitting: Vascular Surgery

## 2020-11-07 ENCOUNTER — Other Ambulatory Visit: Payer: Self-pay | Admitting: Internal Medicine

## 2020-11-07 ENCOUNTER — Telehealth: Payer: Self-pay

## 2020-11-07 MED ORDER — TRAMADOL HCL 50 MG PO TABS: ORAL_TABLET | ORAL | 0 refills | Status: DC

## 2020-11-07 NOTE — Telephone Encounter (Signed)
Spoke with she already received 14 tab for tramadol and they fill more next week pt aware

## 2020-11-15 IMAGING — CT CT CHEST W/O CM
2 of 4 series · 12 of 36 positions shown, 15 images · non-contrast
Comparison: 02/03/2017

CLINICAL DATA: Dyspnea, chronic airway disease

EXAM:
CT CHEST WITHOUT CONTRAST
TECHNIQUE: Multidetector CT imaging of the chest was performed following the
standard protocol without IV contrast.

[Series 2: chest 2.00 ax · axial · 0.46mm/px · z∈[-1202,-920]mm · 9 of 167 slices shown, 12 images]
[im 13/167  mediastinal]
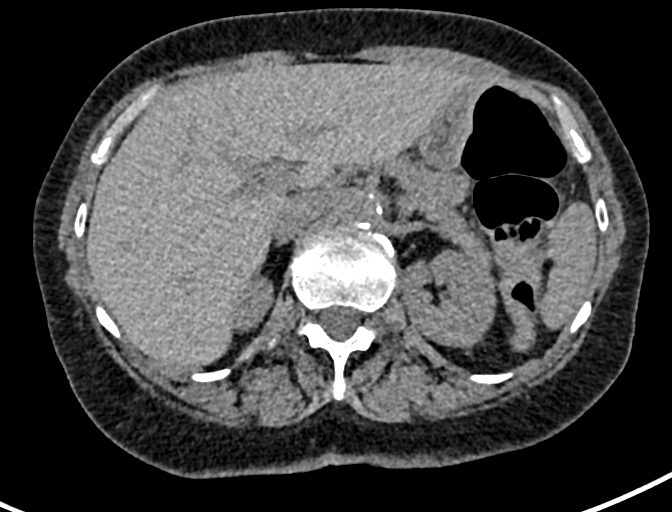
[im 13/167  lung]
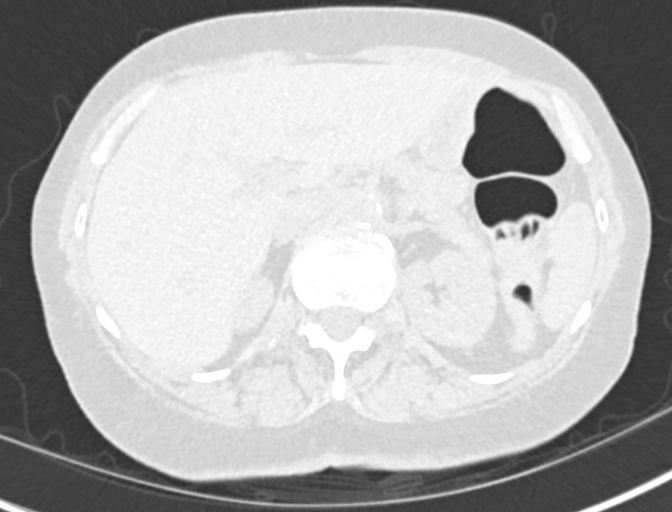
[im 39/167  lung]
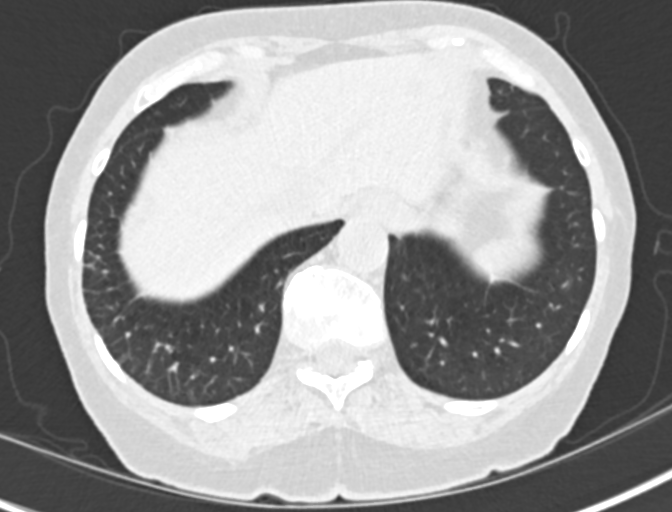
[im 52/167  lung]
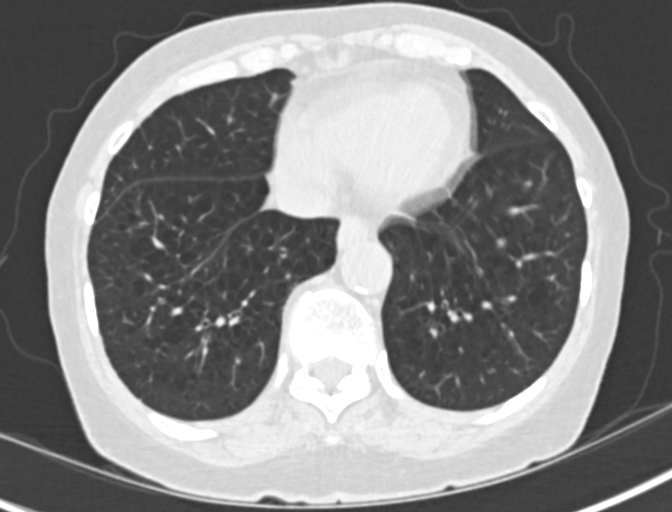
[im 64/167  lung]
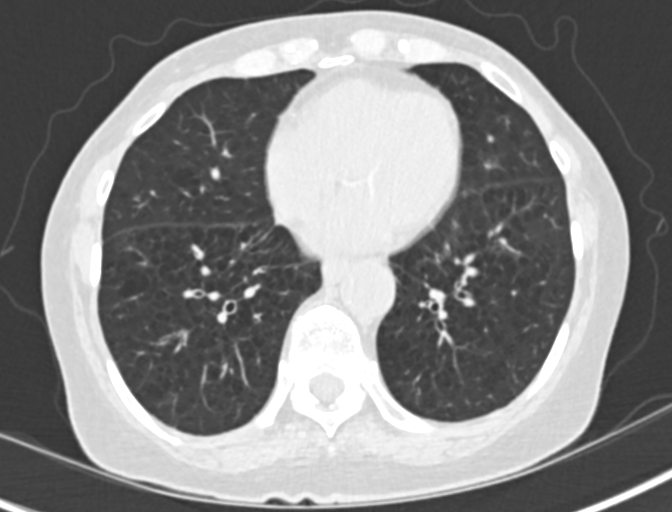
[im 90/167  mediastinal]
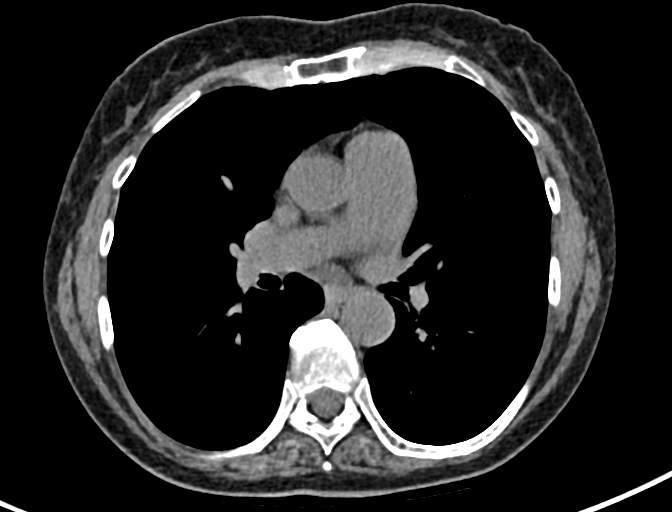
[im 90/167  lung]
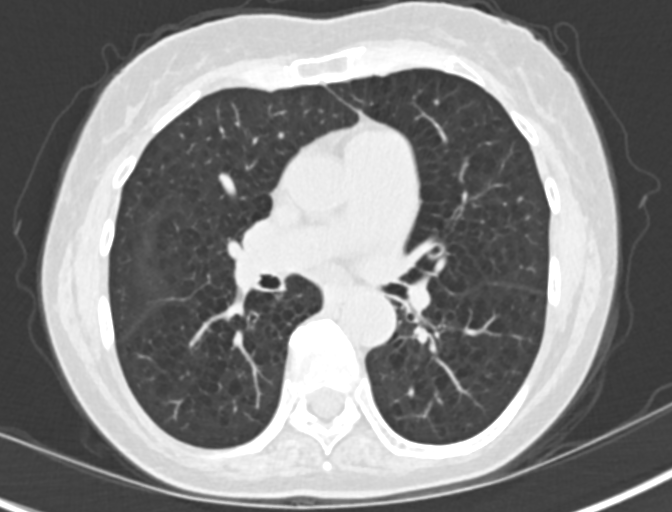
[im 103/167  lung]
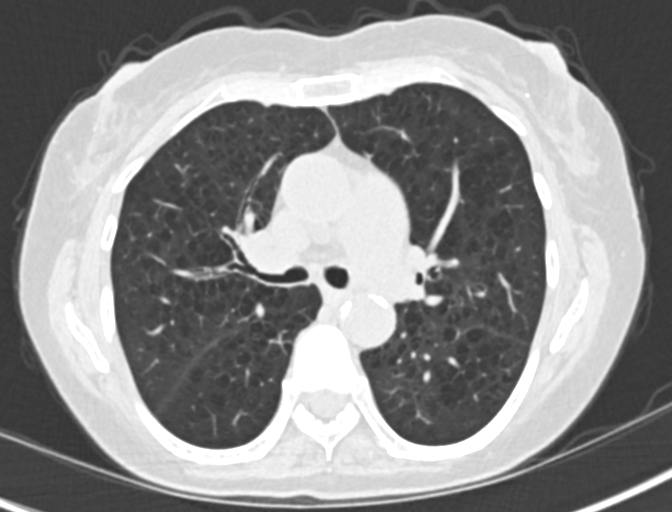
[im 115/167  lung]
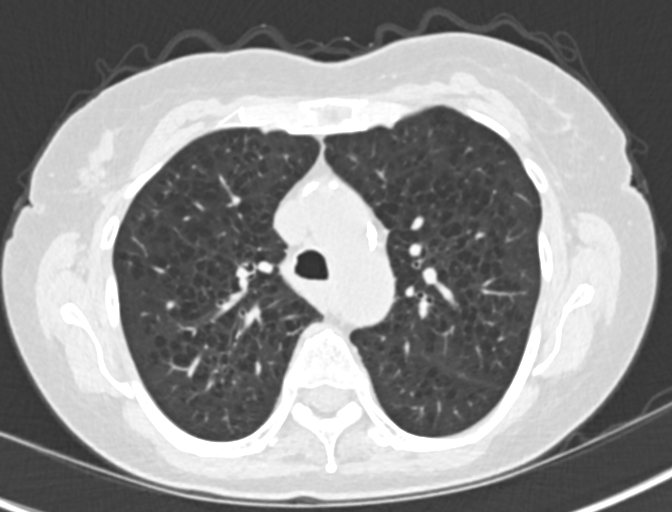
[im 141/167  lung]
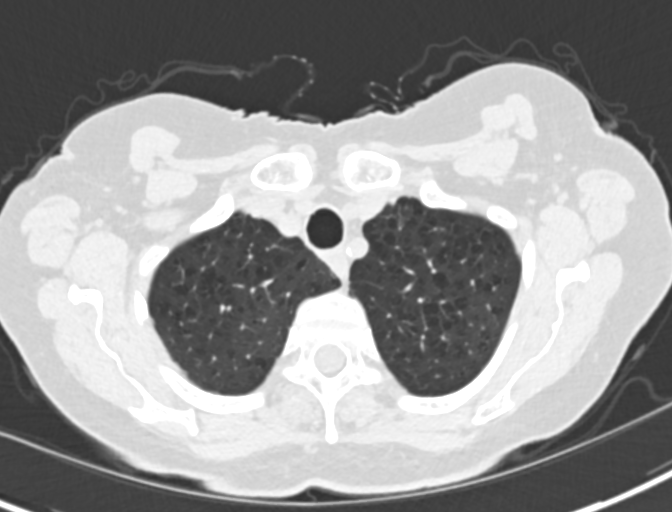
[im 154/167  mediastinal]
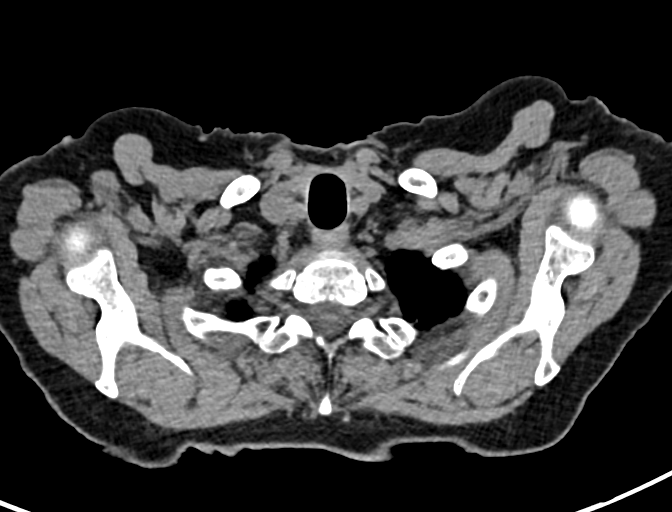
[im 154/167  lung]
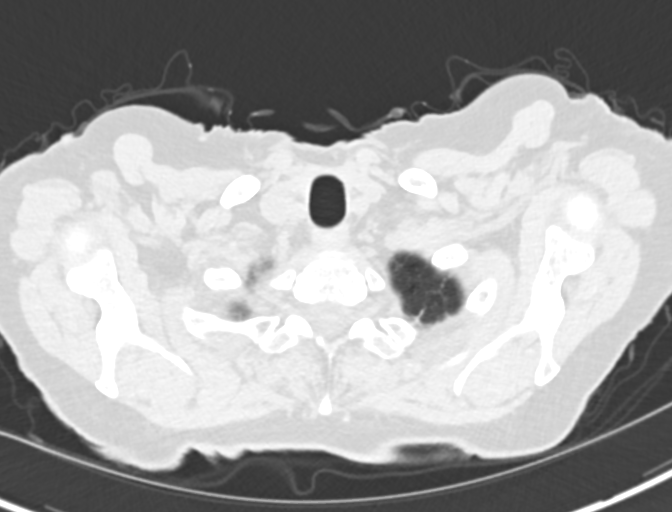

[Series 8: coronals chest 2.00 cor · coronal · 0.61mm/px · 3 of 132 slices shown]
[im 27/132  lung]
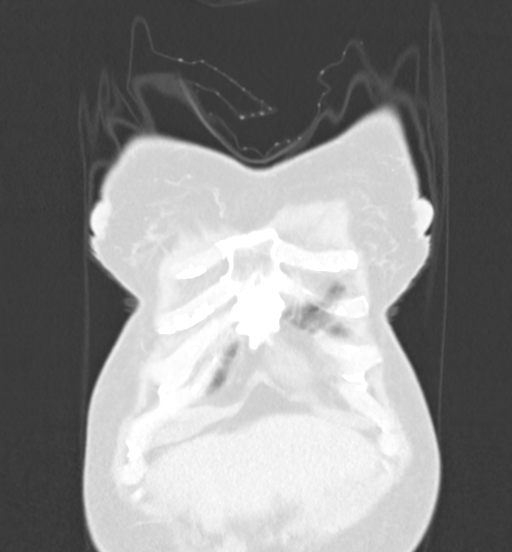
[im 53/132  lung]
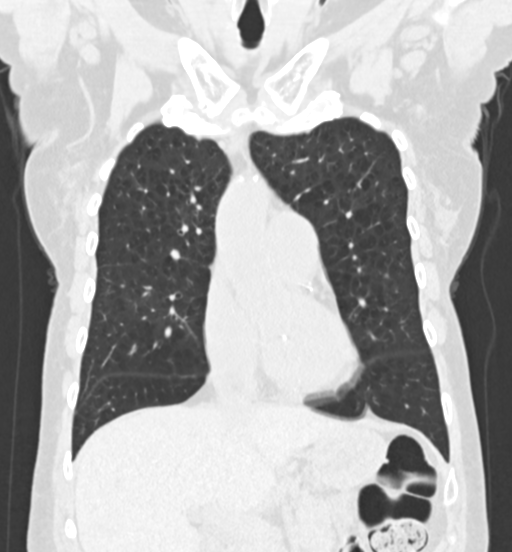
[im 79/132  lung]
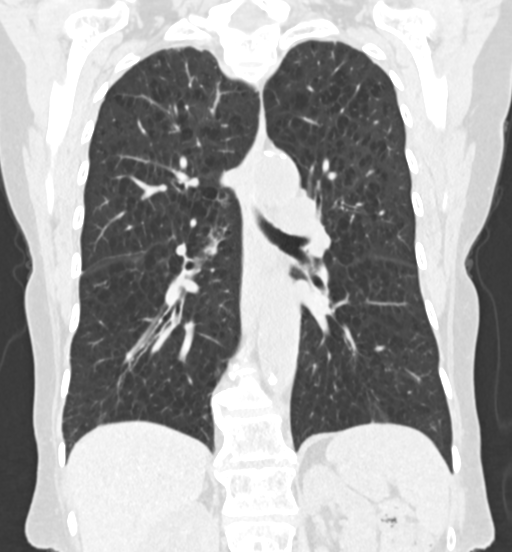

[12 of 36 positions shown; findings below may reference images not displayed]

FINDINGS: Cardiovascular: Extensive multi-vessel coronary artery
calcification. Global cardiac size within normal limits. No
pericardial effusion. The central pulmonary arteries are markedly
enlarged in keeping with changes of pulmonary arterial hypertension,
unchanged. Moderate atherosclerotic calcification noted within the
thoracic aorta. No aortic aneurysm.

Mediastinum/Nodes: No pathologic thoracic adenopathy. Esophagus
unremarkable.

Lungs/Pleura: The lungs are symmetrically hyperinflated and
pulmonary hyperinflation has progressed slightly since prior
examination. Severe centrilobular emphysema is again noted. Diffuse
bronchial wall thickening is again seen compatible with chronic
airway inflammation. No central obstructing lesion. Several impacted
peripheral airways are identified, though this appears relatively
minimal. No superimposed confluent pulmonary infiltrates or focal
pulmonary nodules. No pneumothorax or pleural effusion.

Upper Abdomen: No acute abnormality.

Musculoskeletal: No acute bone abnormality
IMPRESSION: Severe centrilobular emphysema. Progressive pulmonary
hyperinflation. Persistent bronchial wall thickening in keeping with
chronic airway inflammation.

Extensive coronary artery calcification.

Morphologic changes in keeping with pulmonary arterial hypertension.

Aortic Atherosclerosis (FOAIQ-FOP.P) and Emphysema (FOAIQ-ZIC.6).

## 2020-11-18 ENCOUNTER — Inpatient Hospital Stay
Admission: EM | Admit: 2020-11-18 | Discharge: 2020-11-21 | DRG: 512 | Disposition: A | Discharge status: Skilled Nursing Facility | Payer: Medicare Other | Attending: Internal Medicine | Admitting: Internal Medicine

## 2020-11-18 ENCOUNTER — Emergency Department: Payer: Medicare Other

## 2020-11-18 ENCOUNTER — Inpatient Hospital Stay: Payer: Medicare Other

## 2020-11-18 ENCOUNTER — Other Ambulatory Visit: Payer: Self-pay

## 2020-11-18 DIAGNOSIS — S82031A Displaced transverse fracture of right patella, initial encounter for closed fracture: Secondary | ICD-10-CM | POA: Diagnosis not present

## 2020-11-18 DIAGNOSIS — S42402A Unspecified fracture of lower end of left humerus, initial encounter for closed fracture: Secondary | ICD-10-CM

## 2020-11-18 DIAGNOSIS — I10 Essential (primary) hypertension: Secondary | ICD-10-CM | POA: Diagnosis present

## 2020-11-18 DIAGNOSIS — F32A Depression, unspecified: Secondary | ICD-10-CM | POA: Diagnosis present

## 2020-11-18 DIAGNOSIS — Z419 Encounter for procedure for purposes other than remedying health state, unspecified: Secondary | ICD-10-CM

## 2020-11-18 DIAGNOSIS — S82001A Unspecified fracture of right patella, initial encounter for closed fracture: Secondary | ICD-10-CM

## 2020-11-18 DIAGNOSIS — Z79899 Other long term (current) drug therapy: Secondary | ICD-10-CM | POA: Diagnosis not present

## 2020-11-18 DIAGNOSIS — M25561 Pain in right knee: Secondary | ICD-10-CM | POA: Diagnosis not present

## 2020-11-18 DIAGNOSIS — W010XXA Fall on same level from slipping, tripping and stumbling without subsequent striking against object, initial encounter: Secondary | ICD-10-CM | POA: Diagnosis present

## 2020-11-18 DIAGNOSIS — R040 Epistaxis: Secondary | ICD-10-CM

## 2020-11-18 DIAGNOSIS — J449 Chronic obstructive pulmonary disease, unspecified: Secondary | ICD-10-CM | POA: Diagnosis present

## 2020-11-18 DIAGNOSIS — Z79890 Hormone replacement therapy (postmenopausal): Secondary | ICD-10-CM | POA: Diagnosis not present

## 2020-11-18 DIAGNOSIS — Z888 Allergy status to other drugs, medicaments and biological substances status: Secondary | ICD-10-CM

## 2020-11-18 DIAGNOSIS — S82009A Unspecified fracture of unspecified patella, initial encounter for closed fracture: Secondary | ICD-10-CM | POA: Diagnosis present

## 2020-11-18 DIAGNOSIS — Z20822 Contact with and (suspected) exposure to covid-19: Secondary | ICD-10-CM | POA: Diagnosis not present

## 2020-11-18 DIAGNOSIS — M5136 Other intervertebral disc degeneration, lumbar region: Secondary | ICD-10-CM | POA: Diagnosis not present

## 2020-11-18 DIAGNOSIS — M6281 Muscle weakness (generalized): Secondary | ICD-10-CM | POA: Diagnosis not present

## 2020-11-18 DIAGNOSIS — Z811 Family history of alcohol abuse and dependence: Secondary | ICD-10-CM | POA: Diagnosis not present

## 2020-11-18 DIAGNOSIS — Z886 Allergy status to analgesic agent status: Secondary | ICD-10-CM | POA: Diagnosis not present

## 2020-11-18 DIAGNOSIS — Z87891 Personal history of nicotine dependence: Secondary | ICD-10-CM

## 2020-11-18 DIAGNOSIS — Z8249 Family history of ischemic heart disease and other diseases of the circulatory system: Secondary | ICD-10-CM

## 2020-11-18 DIAGNOSIS — M25422 Effusion, left elbow: Secondary | ICD-10-CM | POA: Diagnosis not present

## 2020-11-18 DIAGNOSIS — R6889 Other general symptoms and signs: Secondary | ICD-10-CM | POA: Diagnosis not present

## 2020-11-18 DIAGNOSIS — E0500 Thyrotoxicosis with diffuse goiter without thyrotoxic crisis or storm: Secondary | ICD-10-CM | POA: Diagnosis not present

## 2020-11-18 DIAGNOSIS — R1319 Other dysphagia: Secondary | ICD-10-CM | POA: Diagnosis not present

## 2020-11-18 DIAGNOSIS — E063 Autoimmune thyroiditis: Secondary | ICD-10-CM | POA: Diagnosis not present

## 2020-11-18 DIAGNOSIS — Z7983 Long term (current) use of bisphosphonates: Secondary | ICD-10-CM | POA: Diagnosis not present

## 2020-11-18 DIAGNOSIS — Z833 Family history of diabetes mellitus: Secondary | ICD-10-CM

## 2020-11-18 DIAGNOSIS — S82044A Nondisplaced comminuted fracture of right patella, initial encounter for closed fracture: Secondary | ICD-10-CM | POA: Diagnosis not present

## 2020-11-18 DIAGNOSIS — Y92019 Unspecified place in single-family (private) house as the place of occurrence of the external cause: Secondary | ICD-10-CM

## 2020-11-18 DIAGNOSIS — E782 Mixed hyperlipidemia: Secondary | ICD-10-CM | POA: Diagnosis not present

## 2020-11-18 DIAGNOSIS — I6523 Occlusion and stenosis of bilateral carotid arteries: Secondary | ICD-10-CM | POA: Diagnosis not present

## 2020-11-18 DIAGNOSIS — J40 Bronchitis, not specified as acute or chronic: Secondary | ICD-10-CM | POA: Diagnosis not present

## 2020-11-18 DIAGNOSIS — Z043 Encounter for examination and observation following other accident: Secondary | ICD-10-CM | POA: Diagnosis not present

## 2020-11-18 DIAGNOSIS — S0993XA Unspecified injury of face, initial encounter: Secondary | ICD-10-CM | POA: Diagnosis not present

## 2020-11-18 DIAGNOSIS — S52022A Displaced fracture of olecranon process without intraarticular extension of left ulna, initial encounter for closed fracture: Secondary | ICD-10-CM | POA: Diagnosis present

## 2020-11-18 DIAGNOSIS — Z741 Need for assistance with personal care: Secondary | ICD-10-CM | POA: Diagnosis not present

## 2020-11-18 DIAGNOSIS — E119 Type 2 diabetes mellitus without complications: Secondary | ICD-10-CM | POA: Diagnosis present

## 2020-11-18 DIAGNOSIS — R609 Edema, unspecified: Secondary | ICD-10-CM | POA: Diagnosis not present

## 2020-11-18 DIAGNOSIS — E890 Postprocedural hypothyroidism: Secondary | ICD-10-CM | POA: Diagnosis present

## 2020-11-18 DIAGNOSIS — M25522 Pain in left elbow: Secondary | ICD-10-CM | POA: Diagnosis not present

## 2020-11-18 DIAGNOSIS — F4310 Post-traumatic stress disorder, unspecified: Secondary | ICD-10-CM | POA: Diagnosis present

## 2020-11-18 DIAGNOSIS — S52032A Displaced fracture of olecranon process with intraarticular extension of left ulna, initial encounter for closed fracture: Secondary | ICD-10-CM | POA: Diagnosis not present

## 2020-11-18 DIAGNOSIS — R279 Unspecified lack of coordination: Secondary | ICD-10-CM | POA: Diagnosis not present

## 2020-11-18 DIAGNOSIS — I272 Pulmonary hypertension, unspecified: Secondary | ICD-10-CM | POA: Diagnosis not present

## 2020-11-18 DIAGNOSIS — Z7951 Long term (current) use of inhaled steroids: Secondary | ICD-10-CM

## 2020-11-18 DIAGNOSIS — Z818 Family history of other mental and behavioral disorders: Secondary | ICD-10-CM

## 2020-11-18 DIAGNOSIS — S82034A Nondisplaced transverse fracture of right patella, initial encounter for closed fracture: Secondary | ICD-10-CM | POA: Diagnosis not present

## 2020-11-18 DIAGNOSIS — J439 Emphysema, unspecified: Secondary | ICD-10-CM | POA: Diagnosis not present

## 2020-11-18 DIAGNOSIS — M797 Fibromyalgia: Secondary | ICD-10-CM | POA: Diagnosis present

## 2020-11-18 DIAGNOSIS — Z825 Family history of asthma and other chronic lower respiratory diseases: Secondary | ICD-10-CM

## 2020-11-18 DIAGNOSIS — S52023A Displaced fracture of olecranon process without intraarticular extension of unspecified ulna, initial encounter for closed fracture: Secondary | ICD-10-CM | POA: Diagnosis present

## 2020-11-18 DIAGNOSIS — W19XXXA Unspecified fall, initial encounter: Secondary | ICD-10-CM

## 2020-11-18 DIAGNOSIS — J410 Simple chronic bronchitis: Secondary | ICD-10-CM | POA: Diagnosis not present

## 2020-11-18 DIAGNOSIS — Z823 Family history of stroke: Secondary | ICD-10-CM

## 2020-11-18 DIAGNOSIS — R296 Repeated falls: Secondary | ICD-10-CM | POA: Diagnosis not present

## 2020-11-18 DIAGNOSIS — E038 Other specified hypothyroidism: Secondary | ICD-10-CM

## 2020-11-18 DIAGNOSIS — Z7982 Long term (current) use of aspirin: Secondary | ICD-10-CM

## 2020-11-18 DIAGNOSIS — Z743 Need for continuous supervision: Secondary | ICD-10-CM | POA: Diagnosis not present

## 2020-11-18 DIAGNOSIS — S42402D Unspecified fracture of lower end of left humerus, subsequent encounter for fracture with routine healing: Secondary | ICD-10-CM | POA: Diagnosis not present

## 2020-11-18 DIAGNOSIS — E039 Hypothyroidism, unspecified: Secondary | ICD-10-CM | POA: Diagnosis not present

## 2020-11-18 DIAGNOSIS — J309 Allergic rhinitis, unspecified: Secondary | ICD-10-CM | POA: Diagnosis not present

## 2020-11-18 DIAGNOSIS — R5381 Other malaise: Secondary | ICD-10-CM | POA: Diagnosis not present

## 2020-11-18 DIAGNOSIS — Z4789 Encounter for other orthopedic aftercare: Secondary | ICD-10-CM | POA: Diagnosis not present

## 2020-11-18 DIAGNOSIS — M47812 Spondylosis without myelopathy or radiculopathy, cervical region: Secondary | ICD-10-CM | POA: Diagnosis not present

## 2020-11-18 DIAGNOSIS — R2689 Other abnormalities of gait and mobility: Secondary | ICD-10-CM | POA: Diagnosis not present

## 2020-11-18 LAB — CBC
HCT: 36.7 % (ref 36.0–46.0)
Hemoglobin: 11.9 g/dL — ABNORMAL LOW (ref 12.0–15.0)
MCH: 32.6 pg (ref 26.0–34.0)
MCHC: 32.4 g/dL (ref 30.0–36.0)
MCV: 100.5 fL — ABNORMAL HIGH (ref 80.0–100.0)
Platelets: 173 10*3/uL (ref 150–400)
RBC: 3.65 MIL/uL — ABNORMAL LOW (ref 3.87–5.11)
RDW: 11.5 % (ref 11.5–15.5)
WBC: 7.4 10*3/uL (ref 4.0–10.5)
nRBC: 0 % (ref 0.0–0.2)

## 2020-11-18 LAB — BASIC METABOLIC PANEL
Anion gap: 4 — ABNORMAL LOW (ref 5–15)
BUN: 17 mg/dL (ref 8–23)
CO2: 34 mmol/L — ABNORMAL HIGH (ref 22–32)
Calcium: 9.1 mg/dL (ref 8.9–10.3)
Chloride: 92 mmol/L — ABNORMAL LOW (ref 98–111)
Creatinine, Ser: 0.71 mg/dL (ref 0.44–1.00)
GFR, Estimated: >60 mL/min (ref >60)
Glucose, Bld: 145 mg/dL — ABNORMAL HIGH (ref 70–99)
Potassium: 4.4 mmol/L (ref 3.5–5.1)
Sodium: 130 mmol/L — ABNORMAL LOW (ref 135–145)

## 2020-11-18 LAB — RESP PANEL BY RT-PCR (FLU A&B, COVID) ARPGX2
Influenza A by PCR: NEGATIVE
Influenza B by PCR: NEGATIVE
SARS Coronavirus 2 by RT PCR: NEGATIVE

## 2020-11-18 LAB — CBG MONITORING, ED: Glucose-Capillary: 164 mg/dL — ABNORMAL HIGH (ref 70–99)

## 2020-11-18 LAB — PROTIME-INR
INR: 0.9 (ref 0.8–1.2)
Prothrombin Time: 12.2 seconds (ref 11.4–15.2)

## 2020-11-18 LAB — APTT: aPTT: 34 seconds (ref 24–36)

## 2020-11-18 MED ORDER — MORPHINE SULFATE (PF) 2 MG/ML IV SOLN
0.5000 mg | INTRAVENOUS | Status: DC | PRN
  Administered 2020-11-18 – 2020-11-19 (×3): 0.5 mg via INTRAVENOUS
  Filled 2020-11-18 (×3): qty 1

## 2020-11-18 MED ORDER — IPRATROPIUM-ALBUTEROL 0.5-2.5 (3) MG/3ML IN SOLN
3.0000 mL | Freq: Once | RESPIRATORY_TRACT | Status: AC
  Administered 2020-11-18: 3 mL via RESPIRATORY_TRACT
  Filled 2020-11-18: qty 3

## 2020-11-18 MED ORDER — ASPIRIN EC 81 MG PO TBEC: 81.0000 mg | DELAYED_RELEASE_TABLET | Freq: Every day | ORAL | Status: DC

## 2020-11-18 MED ORDER — IPRATROPIUM-ALBUTEROL 0.5-2.5 (3) MG/3ML IN SOLN
3.0000 mL | Freq: Three times a day (TID) | RESPIRATORY_TRACT | Status: DC
  Administered 2020-11-19 (×2): 3 mL via RESPIRATORY_TRACT
  Filled 2020-11-18 (×3): qty 3

## 2020-11-18 MED ORDER — OXYCODONE-ACETAMINOPHEN 5-325 MG PO TABS
1.0000 | ORAL_TABLET | ORAL | Status: AC
  Administered 2020-11-18: 1 via ORAL
  Filled 2020-11-18: qty 1

## 2020-11-18 MED ORDER — ADULT MULTIVITAMIN W/MINERALS CH
1.0000 | ORAL_TABLET | Freq: Every day | ORAL | Status: DC
  Administered 2020-11-18 – 2020-11-21 (×3): 1 via ORAL
  Filled 2020-11-18 (×3): qty 1

## 2020-11-18 MED ORDER — ROSUVASTATIN CALCIUM 10 MG PO TABS
20.0000 mg | ORAL_TABLET | Freq: Every day | ORAL | Status: DC
  Administered 2020-11-19 – 2020-11-20 (×3): 20 mg via ORAL
  Filled 2020-11-18: qty 1
  Filled 2020-11-18 (×2): qty 2

## 2020-11-18 MED ORDER — FLUOXETINE HCL 20 MG PO CAPS
40.0000 mg | ORAL_CAPSULE | Freq: Every day | ORAL | Status: DC
  Administered 2020-11-18 – 2020-11-21 (×3): 40 mg via ORAL
  Filled 2020-11-18 (×4): qty 2

## 2020-11-18 MED ORDER — MIRTAZAPINE 15 MG PO TABS
7.5000 mg | ORAL_TABLET | Freq: Every day | ORAL | Status: DC
  Administered 2020-11-19 – 2020-11-20 (×2): 7.5 mg via ORAL
  Filled 2020-11-18 (×3): qty 1

## 2020-11-18 MED ORDER — CEFAZOLIN SODIUM-DEXTROSE 2-4 GM/100ML-% IV SOLN
2.0000 g | Freq: Once | INTRAVENOUS | Status: AC
  Administered 2020-11-19: 2 g via INTRAVENOUS

## 2020-11-18 MED ORDER — FENTANYL CITRATE (PF) 100 MCG/2ML IJ SOLN
25.0000 ug | Freq: Once | INTRAMUSCULAR | Status: AC
  Administered 2020-11-18: 25 ug via INTRAVENOUS
  Filled 2020-11-18: qty 2

## 2020-11-18 MED ORDER — LEVOTHYROXINE SODIUM 88 MCG PO TABS
88.0000 ug | ORAL_TABLET | Freq: Every day | ORAL | Status: DC
  Administered 2020-11-19 – 2020-11-21 (×3): 88 ug via ORAL
  Filled 2020-11-18 (×3): qty 1

## 2020-11-18 MED ORDER — LORAZEPAM 1 MG PO TABS
1.0000 mg | ORAL_TABLET | Freq: Every evening | ORAL | Status: DC | PRN
  Administered 2020-11-19 – 2020-11-20 (×3): 1 mg via ORAL
  Filled 2020-11-18 (×3): qty 1

## 2020-11-18 MED ORDER — VITAMIN B-12 1000 MCG/15ML PO LIQD: 1200.0000 ug | Freq: Every day | ORAL | Status: DC

## 2020-11-18 MED ORDER — HYDROCODONE-ACETAMINOPHEN 5-325 MG PO TABS: 1.0000 | ORAL_TABLET | Freq: Four times a day (QID) | ORAL | Status: DC | PRN

## 2020-11-18 MED ORDER — GUAIFENESIN 400 MG PO TABS: 800.0000 mg | ORAL_TABLET | Freq: Four times a day (QID) | ORAL | Status: DC | PRN

## 2020-11-18 MED ORDER — ALBUTEROL SULFATE (2.5 MG/3ML) 0.083% IN NEBU
2.5000 mg | INHALATION_SOLUTION | RESPIRATORY_TRACT | Status: DC | PRN
  Administered 2020-11-20 (×2): 2.5 mg via RESPIRATORY_TRACT
  Filled 2020-11-18 (×2): qty 3

## 2020-11-18 NOTE — H&P (Signed)
History and Physical   [name] IFO:[id number] DOB: Aug 25, 1945 DOA: 11/18/2020  PCP: Lavera Guise, MD  Outpatient Specialists: Dr. Clayborn Bigness Patient coming from: Home via EMS  I have personally briefly reviewed patient's old medical records in [place].  Chief Concern: Fall  HPI: [name] is a 75 y.o. female with medical history significant for anxiety, hyperlipidemia, hypothyroid, history of COPD, depression, history of tobacco use, presents to the emergency department for chief concerns of a fall.  Patient states that her home health aide which is sleeping and she was closing the front door.  After closing the door, while walking away from the door, she tripped on her shoes and fell face forward.  She denies loss of consciousness, chest pain, shortness of breath, fever, new cough, nausea, vomiting, abdominal pain, dysuria, hematuria, diarrhea.  She endorses baseline cough that is unchanged.  Social history: Patient lives at home by herself.  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  She denies EtOH and recreational drug use.  She is a former tobacco user, smoking 1/3 to 1/2 pack/day.  She quit about 11 years ago.  Vaccination history: Patient is vaccinated for COVID-19, Pfizer, 3 doses  ROS: Constitutional: no weight change, no fever ENT/Mouth: no sore throat, no rhinorrhea Eyes: no eye pain, no vision changes Cardiovascular: no chest pain, no dyspnea,  no edema, no palpitations Respiratory: + cough, no sputum, no wheezing Gastrointestinal: no nausea, no vomiting, no diarrhea, no constipation Genitourinary: no urinary incontinence, no dysuria, no hematuria Musculoskeletal: + arthralgias, + myalgias, right knee and left elbow pain Skin: + skin lesions, no pruritus, Neuro: + weakness, no loss of consciousness, no syncope Psych: no anxiety, no depression, no decrease appetite Heme/Lymph: + bruising, no active bleeding  ED Course:  Discussed with ED provider, patient requiring hospitalization for left olecranon fracture and right patella fracture.  Vitals in the emergency department was remarkable for temperature of 97.9, respiration rate of 18, heart rate 73, blood pressure 191/78, SPO2 100% on 2 L nasal cannula.  Labs in the emergency department was remarkable for sodium 130, potassium 4.4, chloride 92, bicarb 34, nonfasting blood glucose 135, BUN is 17, serum creatinine of 0.71, EGFR greater than 60.  CBC was remarkable for WBC 7.4, hemoglobin 11.9, platelets 173.  INR 0.9, PT 12.2.  Assessment/Plan  Principal Problem:   Olecranon fracture Active Problems:   Bronchitis   Hypothyroidism   Hyperlipidemia, mixed   Pulmonary HTN (HCC)   Patellar fracture   # Left olecranon fracture-orthopedic has been consulted - Present admission, secondary to mechanical fall - Pain is controlled with fentanyl 25 mcg - Norco 5-3 25, 1 to 2 tablets every 6 hours as needed for moderate pain; morphine 0.5 mg IV every 2 hours for severe pain, two doses ordered - Pending orthopedic evaluation for possible orthopedic surgery on 11/19/2020 - Per EDP left posterior splint and brace in place - N.p.o. after midnight, except for ice chips and sips with meds  # Right patella fracture-present on admission - Secondary to mechanical fall - Pain control as above - Orthopedic surgery will evaluate for possible intervention  # COPD-in bronchitis secondary to history of tobacco use - Resumed home inhalers, albuterol, duo nebs - Do not resume Symbicort as this is not on formulary  # Hypothyroid-levothyroxine 88 mcg q. before breakfast resumed  # Hyperlipidemia-rosuvastatin 20 mg nightly  # Left eye ecchymosis-present admission  # Depression/anxiety-fluoxetine 40 mg  daily resumed - Mirtazapine 7.5 mg nightly - Lorazepam 1 mg p.o. nightly as needed for anxiety and sleep resumed  # No pharmacologic DVT prophylaxis at this time due to  orthopedic surgical intervention anticipated for 11/19/2020 and concerns for worsening bleeding with large left olecranon hematoma secondary to mechanical fall - A.m. team to resume DVT prophylaxis when appropriate  Chart reviewed.   DVT prophylaxis: SCDs Code Status: Full code Diet: Heart healthy Family Communication: No Disposition Plan: Pending clinical course Consults called: Orthopedic Admission status: Inpatient, MedSurg, telemetry  Past Medical History:  Diagnosis Date   Anxiety    Chest pain    CHF (congestive heart failure) (HCC)    COPD (chronic obstructive pulmonary disease) ([place])    Depression    Diabetes mellitus without complication (HCC)    Fibromyalgia    Graves disease    Hypertension    IBS (irritable bowel syndrome)    PTSD (post-traumatic stress disorder)    Sinus problem    Spinal stenosis    Past Surgical History:  Procedure Laterality Date   ABDOMINAL HYSTERECTOMY     ESOPHAGOGASTRODUODENOSCOPY (EGD) WITH PROPOFOL N/A 01/10/2015   Procedure: ESOPHAGOGASTRODUODENOSCOPY (EGD) WITH PROPOFOL;  Surgeon: Hulen Luster, MD;  Location: [facility] ENDOSCOPY;  Service: Gastroenterology;  Laterality: N/A;   RECTAL SURGERY     RIGHT/LEFT HEART CATH AND CORONARY ANGIOGRAPHY Bilateral 04/08/2020   Procedure: RIGHT/LEFT HEART CATH AND CORONARY ANGIOGRAPHY;  Surgeon: Wellington Hampshire, MD;  Location: [place] [facility] LAB;  Service: Cardiovascular;  Laterality: Bilateral;   SAVORY DILATION N/A 01/10/2015   Procedure: SAVORY DILATION;  Surgeon: Hulen Luster, MD;  Location: [facility] ENDOSCOPY;  Service: Gastroenterology;  Laterality: N/A;   toenail removal Bilateral    great toes   TONSILLECTOMY AND ADENOIDECTOMY     Social History:  reports that she quit smoking about 4 years ago. Her smoking use included cigarettes. She has a 12.50 pack-year smoking history. She has never used smokeless tobacco. She reports that she does not drink alcohol and does not use drugs.  Allergies  Allergen  Reactions   Celecoxib     Other reaction(s): Other (qualifier value) Patient is allergic to Celebrex and found that it caused heart failure. CHF   Pregabalin     Other reaction(s): Localized superficial swelling of skin   Family History  Problem Relation Age of Onset   Alcohol abuse Mother    Depression Mother    Varicose Veins Mother    Alcohol abuse Father    Depression Father    Alcohol abuse Sister    Asthma Sister    COPD Sister    Depression Sister    Heart disease Sister    Hypertension Sister    Diabetes Sister    Depression Maternal Grandmother    Diabetes Paternal Grandmother    Stroke Paternal Grandmother    Family history: Family history reviewed and not pertinent  Prior to Admission medications   Medication Sig Start Date End Date Taking? Authorizing Provider  acetaminophen (TYLENOL) 500 MG tablet Take 1,000 mg by mouth in the morning and at bedtime.     [provider]  albuterol (PROAIR HFA) 108 (90 Base) MCG/ACT inhaler INHALE 2 PUFFS INTO LUNGS EVERY 6 HOURS AS NEEDED FOR WHEEZING OR SHORTNESS OF BREATH 07/05/20   Lavera Guise, MD  albuterol (VENTOLIN HFA) 108 (90 Base) MCG/ACT inhaler Inhale 2 puffs into the lungs every 6 (six) hours as needed for wheezing or shortness of breath. 07/05/20  Lavera Guise, MD  alendronate (FOSAMAX) 70 MG tablet Take 70 mg by mouth every Tuesday.  01/11/19   [provider]  aspirin EC 81 MG tablet Take 81 mg by mouth daily. Swallow whole.    [provider]  Azelastine HCl 137 MCG/SPRAY SOLN Place 2 sprays into both nostrils in the morning and at bedtime.  09/06/19   [provider]  Cyanocobalamin (VITAMIN B-12) 1000 MCG/15ML LIQD Take 1,200 mcg by mouth daily.    [provider]  FLUoxetine (PROZAC) 20 MG capsule Take 2 capsules (40 mg total) by mouth daily. 10/29/20   Lavera Guise, MD  fluticasone [facility]) 50 MCG/ACT nasal spray Place 2 sprays into both nostrils in the morning and at  bedtime.  03/13/19   [provider]  guaifenesin (HUMIBID E) 400 MG TABS tablet Take 800 mg by mouth every 6 (six) hours as needed (congestion/cough).     [provider]  hydrocortisone 2.5 % cream Place 1 application rectally 2 (two) times daily as needed (itching/hemorroids).     [provider]  ipratropium-albuterol (DUONEB) 0.5-2.5 (3) MG/3ML SOLN One vial 3 x  aday for copd with hypoxia Patient taking differently: Inhale 3 mLs into the lungs in the morning, at noon, and at bedtime. 02/27/20   Lavera Guise, MD  levothyroxine (SYNTHROID) 88 MCG tablet Take 88 mcg by mouth daily before breakfast.    [provider]  LORazepam (ATIVAN) 1 MG tablet Take 1 tablet (1 mg total) by mouth at bedtime. 10/29/20   Lavera Guise, MD  mirtazapine (REMERON) 7.5 MG tablet Take 1 tablet (7.5 mg total) by mouth at bedtime. 10/29/20   Lavera Guise, MD  Multiple Vitamin (MULTIVITAMIN WITH MINERALS) TABS tablet Take 1 tablet by mouth daily. Multivitamin for Seniors    [provider]  rosuvastatin (CRESTOR) 20 MG tablet Take 1 tablet (20 mg total) by mouth daily. 03/12/20   Lavera Guise, MD  SYMBICORT 160-4.5 MCG/ACT inhaler Inhale 2 puffs into the lungs 2 (two) times daily. 02/20/19   [provider]  tiotropium (SPIRIVA) 18 MCG inhalation capsule Place 18 mcg into inhaler and inhale at bedtime. Reported on 08/07/2015    [provider]  traMADol Veatrice Bourbon) 50 MG tablet Take one tab po tid for pain 11/07/20   Lavera Guise, MD   Physical Exam: Vitals:   11/18/20 1806 11/18/20 1807 11/18/20 1830 11/18/20 1945  BP:  (!) 205/85 (!) 191/78 140/79  Pulse:   73 80  Resp:   18 18  Temp:      TempSrc:      SpO2:   100% 99%  Weight: 53.7 kg     Height: '5\' 6"'  (1.676 m)      Constitutional: appears age-appropriate, NAD, calm, comfortable Eyes: PERRL, lids and conjunctivae normal ENMT: Mucous membranes are moist. Posterior pharynx clear of any exudate or  lesions. Age-appropriate dentition. Hearing appropriate Neck: normal, supple, no masses, no thyromegaly Respiratory: clear to auscultation bilaterally, no wheezing, no crackles. Normal respiratory effort. No accessory muscle use.  Cardiovascular: Regular rate and rhythm, no murmurs / rubs / gallops. No extremity edema. 2+ pedal pulses. No carotid bruits.  Abdomen: no tenderness, no masses palpated, no hepatosplenomegaly. Bowel sounds positive.  Musculoskeletal: no clubbing / cyanosis. No joint deformity upper and lower extremities. Good ROM, no contractures, no atrophy. Normal muscle tone.  Skin: Left orbital ecchymosis, multiple scratches on her face.  Left elbow swelling, ecchymosis, mild  superficial lacerations   Neurologic: Sensation intact. Strength 5/5 in all 4.  Psychiatric: Normal judgment and insight. Alert and oriented x 3. Normal mood.   EKG: independently reviewed, showing sinus rhythm with rate of 79, QTc 476  Chest x-ray on Admission: I personally reviewed and I agree with radiologist reading as below.  DG Elbow Complete Left  Result Date: 11/18/2020 CLINICAL DATA:  Fall.  Elbow pain. EXAM: LEFT ELBOW - COMPLETE 3+ VIEW COMPARISON:  None. FINDINGS: Three views study shows and olecranon fracture with approximately 17 mm of proximal distraction of the dominant proximal fracture fragment. No other fracture evident. Lateral film shows elevation of the anterior posterior fat pads consistent with joint effusion. There is extensive overlying soft tissue edema/hemorrhage. IMPRESSION: Olecranon fracture with joint effusion and soft tissue edema/hemorrhage. Electronically Signed   By: Misty Stanley M.D.   On: 11/18/2020 19:11   CT Head Wo Contrast  Result Date: 11/18/2020 CLINICAL DATA:  Facial trauma fall EXAM: CT HEAD WITHOUT CONTRAST CT MAXILLOFACIAL WITHOUT CONTRAST CT CERVICAL SPINE WITHOUT CONTRAST TECHNIQUE: Multidetector CT imaging of the head, cervical spine, and maxillofacial  structures were performed using the standard protocol without intravenous contrast. Multiplanar CT image reconstructions of the cervical spine and maxillofacial structures were also generated. COMPARISON:  CT brain 04/09/2016, CT cervical spine 11/24/2016, MRI brain 03/18/2019, CT brain 03/17/2019 FINDINGS: CT HEAD FINDINGS Brain: No acute territorial infarction, hemorrhage, or intracranial mass. The ventricles are nonenlarged. Vascular: No hyperdense vessels.  Carotid vascular calcification. Skull: Normal. Negative for fracture or focal lesion. Other: None CT MAXILLOFACIAL FINDINGS Osseous: Mandibular heads are normally position. No mandibular fracture. Mastoid air cells are clear. Pterygoid plates and zygomatic arches are intact. No acute nasal bone fracture Orbits: Negative. No traumatic or inflammatory finding. Sinuses: Clear. Soft tissues: Small left forehead scalp swelling CT CERVICAL SPINE FINDINGS Alignment: Straightening of the cervical spine. No subluxation. Facet alignment within normal limits Skull base and vertebrae: No acute fracture. No primary bone lesion or focal pathologic process. Soft tissues and spinal canal: No prevertebral fluid or swelling. No visible canal hematoma. Disc levels: Multilevel degenerative changes, most advanced at C5-C6. Upper chest: Apical emphysema Other: None IMPRESSION: 1. No CT evidence for acute intracranial abnormality. 2. No acute facial bone fracture identified 3. Straightening of the cervical spine without acute osseous abnormality 4. Emphysema Electronically Signed   By: Donavan Foil M.D.   On: 11/18/2020 19:06   CT Cervical Spine Wo Contrast  Result Date: 11/18/2020 CLINICAL DATA:  Facial trauma fall EXAM: CT HEAD WITHOUT CONTRAST CT MAXILLOFACIAL WITHOUT CONTRAST CT CERVICAL SPINE WITHOUT CONTRAST TECHNIQUE: Multidetector CT imaging of the head, cervical spine, and maxillofacial structures were performed using the standard protocol without intravenous contrast.  Multiplanar CT image reconstructions of the cervical spine and maxillofacial structures were also generated. COMPARISON:  CT brain 04/09/2016, CT cervical spine 11/24/2016, MRI brain 03/18/2019, CT brain 03/17/2019 FINDINGS: CT HEAD FINDINGS Brain: No acute territorial infarction, hemorrhage, or intracranial mass. The ventricles are nonenlarged. Vascular: No hyperdense vessels.  Carotid vascular calcification. Skull: Normal. Negative for fracture or focal lesion. Other: None CT MAXILLOFACIAL FINDINGS Osseous: Mandibular heads are normally position. No mandibular fracture. Mastoid air cells are clear. Pterygoid plates and zygomatic arches are intact. No acute nasal bone fracture Orbits: Negative. No traumatic or inflammatory finding. Sinuses: Clear. Soft tissues: Small left forehead scalp swelling CT CERVICAL SPINE FINDINGS Alignment: Straightening of the cervical spine. No subluxation. Facet alignment within normal limits Skull base and vertebrae: No acute  fracture. No primary bone lesion or focal pathologic process. Soft tissues and spinal canal: No prevertebral fluid or swelling. No visible canal hematoma. Disc levels: Multilevel degenerative changes, most advanced at C5-C6. Upper chest: Apical emphysema Other: None IMPRESSION: 1. No CT evidence for acute intracranial abnormality. 2. No acute facial bone fracture identified 3. Straightening of the cervical spine without acute osseous abnormality 4. Emphysema Electronically Signed   By: Donavan Foil M.D.   On: 11/18/2020 19:06   Chest Portable 1 View  Result Date: 11/18/2020 CLINICAL DATA:  Fall EXAM: PORTABLE CHEST 1 VIEW COMPARISON:  CT chest 03/07/2020, radiograph 03/17/2019 FINDINGS: Hyperinflation with emphysema and mild chronic bronchitic changes. No acute airspace disease, pleural effusion, or pneumothorax. Normal cardiomediastinal silhouette with aortic atherosclerosis. IMPRESSION: No active disease.  Emphysema and bronchitic changes Electronically  Signed   By: Donavan Foil M.D.   On: 11/18/2020 20:49   DG Knee Complete 4 Views Right  Result Date: 11/18/2020 CLINICAL DATA:  Fall EXAM: RIGHT KNEE - COMPLETE 4+ VIEW COMPARISON:  None. FINDINGS: Acute nondisplaced fracture at the lower pole of the patella. Positive for knee effusion. No dislocation. Mild joint space calcification consistent with chondrocalcinosis. IMPRESSION: Acute nondisplaced patellar fracture with knee effusion. Electronically Signed   By: Donavan Foil M.D.   On: 11/18/2020 19:11   CT Maxillofacial Wo Contrast  Result Date: 11/18/2020 CLINICAL DATA:  Facial trauma fall EXAM: CT HEAD WITHOUT CONTRAST CT MAXILLOFACIAL WITHOUT CONTRAST CT CERVICAL SPINE WITHOUT CONTRAST TECHNIQUE: Multidetector CT imaging of the head, cervical spine, and maxillofacial structures were performed using the standard protocol without intravenous contrast. Multiplanar CT image reconstructions of the cervical spine and maxillofacial structures were also generated. COMPARISON:  CT brain 04/09/2016, CT cervical spine 11/24/2016, MRI brain 03/18/2019, CT brain 03/17/2019 FINDINGS: CT HEAD FINDINGS Brain: No acute territorial infarction, hemorrhage, or intracranial mass. The ventricles are nonenlarged. Vascular: No hyperdense vessels.  Carotid vascular calcification. Skull: Normal. Negative for fracture or focal lesion. Other: None CT MAXILLOFACIAL FINDINGS Osseous: Mandibular heads are normally position. No mandibular fracture. Mastoid air cells are clear. Pterygoid plates and zygomatic arches are intact. No acute nasal bone fracture Orbits: Negative. No traumatic or inflammatory finding. Sinuses: Clear. Soft tissues: Small left forehead scalp swelling CT CERVICAL SPINE FINDINGS Alignment: Straightening of the cervical spine. No subluxation. Facet alignment within normal limits Skull base and vertebrae: No acute fracture. No primary bone lesion or focal pathologic process. Soft tissues and spinal canal: No  prevertebral fluid or swelling. No visible canal hematoma. Disc levels: Multilevel degenerative changes, most advanced at C5-C6. Upper chest: Apical emphysema Other: None IMPRESSION: 1. No CT evidence for acute intracranial abnormality. 2. No acute facial bone fracture identified 3. Straightening of the cervical spine without acute osseous abnormality 4. Emphysema Electronically Signed   By: Donavan Foil M.D.   On: 11/18/2020 19:06    Labs on Admission: I have personally reviewed following labs  CBC: Recent Labs  Lab 11/18/20 2011  WBC 7.4  HGB 11.9*  HCT 36.7  MCV 100.5*  PLT 161   Basic Metabolic Panel: Recent Labs  Lab 11/18/20 2011  NA 130*  K 4.4  CL 92*  CO2 34*  GLUCOSE 145*  BUN 17  CREATININE 0.71  CALCIUM 9.1   GFR: Estimated Creatinine Clearance: 51.5 mL/min (by C-G formula based on SCr of 0.71 mg/dL).  CBG: Recent Labs  Lab 11/18/20 2011  GLUCAP 164*   Urine analysis:    Component Value Date/Time   COLORURINE  YELLOW (A) 03/29/2019 1600   APPEARANCEUR Clear 03/12/2020 1040   LABSPEC 1.019 03/29/2019 1600   LABSPEC 1.009 12/27/2013 1119   PHURINE 5.0 03/29/2019 1600   GLUCOSEU Negative 03/12/2020 1040   GLUCOSEU Negative 12/27/2013 1119   HGBUR SMALL (A) 03/29/2019 1600   BILIRUBINUR Negative 03/12/2020 1040   BILIRUBINUR Negative 12/27/2013 1119   KETONESUR NEGATIVE 03/29/2019 1600   PROTEINUR Negative 03/12/2020 1040   PROTEINUR NEGATIVE 03/29/2019 1600   NITRITE Negative 03/12/2020 1040   NITRITE NEGATIVE 03/29/2019 1600   LEUKOCYTESUR Negative 03/12/2020 1040   LEUKOCYTESUR TRACE (A) 03/29/2019 1600   LEUKOCYTESUR Trace 12/27/2013 1119   Dr. Tobie Poet [facility]  If 7PM-7AM, please contact overnight-coverage provider If 7AM-7PM, please contact day coverage provider [email or web address]  11/18/2020, 9:01 PM

## 2020-11-18 NOTE — ED Provider Notes (Signed)
[facility] Emergency Department Provider Note   ____________________________________________   Event Date/Time   First MD Initiated Contact with Patient 11/18/20 1757     (approximate)  I have reviewed the triage vital signs and the nursing notes.   HISTORY  Chief Complaint Fall    HPI [name] is a 75 y.o. female reports she was walking one of her shoes slipped, and caused her to trip and fall forward onto her face.  She also landed with some discomfort over her right knee though reports he just feels sore, and moderate pain and some swelling at the left elbow but still able to move it.  She reports her nose was bleeding after the fall she landed directly onto her face.  Has a mild headache across the front of her face and nose moderate pain in her left elbow with movement and a small scrape on the skin there.  Mild discomfort the right knee.  No hip pain no pain in her feet.  No pain in her chest back abdomen or neck.  Did not lose consciousness, reports when she went down there was because she tripped over her shoe  She does have COPD and uses medicine for that, reports she has just some slight wheezing typically would just use an inhaler and this is typical daily symptoms nothing new  Past Medical History:  Diagnosis Date   Anxiety    Chest pain    CHF (congestive heart failure) (HCC)    COPD (chronic obstructive pulmonary disease) (HCC)    Depression    Diabetes mellitus without complication (HCC)    Fibromyalgia    Graves disease    Hypertension    IBS (irritable bowel syndrome)    PTSD (post-traumatic stress disorder)    Sinus problem    Spinal stenosis     Patient Active Problem List   Diagnosis Date Noted   Patellar fracture 11/18/2020   Olecranon fracture 11/18/2020   Major depressive disorder, recurrent, in remission, unspecified (HCC) 04/23/2020   Pulmonary HTN (HCC)    Hypothyroidism, acquired 04/25/2019   Acute  metabolic encephalopathy 03/19/2019   Confusion 03/17/2019   AMS (altered mental status) 03/12/2019   Acute encephalopathy 03/11/2019   Hypothyroidism 03/11/2019   Altered mental status, unspecified 03/11/2019   Medicare annual wellness visit, subsequent 06/13/2018   PSVT (paroxysmal supraventricular tachycardia) (HCC) 10/05/2017   Age-related osteoporosis without current pathological fracture 09/15/2017   Chronic obstructive pulmonary disease (HCC) 05/21/2017   Shortness of breath 04/05/2017   Bilateral carotid artery stenosis 03/17/2017   Hyperlipidemia, mixed 03/17/2017   Orthostatic hypotension 01/12/2017   Bronchitis 01/04/2017   Angina pectoris (HCC) 06/13/2015   UTI (lower urinary tract infection) 06/13/2015   Degenerative disc disease, lumbar 10/18/2014   Sacroiliac joint dysfunction 10/18/2014   Facet syndrome, lumbar 10/18/2014   Cervical facet joint syndrome 10/18/2014   Bilateral occipital neuralgia 10/18/2014   Migraine 10/18/2014   Neurosis, posttraumatic 10/08/2014   Posttraumatic stress disorder 10/08/2014   Spinal stenosis    PTSD (post-traumatic stress disorder)     Past Surgical History:  Procedure Laterality Date   ABDOMINAL HYSTERECTOMY     ESOPHAGOGASTRODUODENOSCOPY (EGD) WITH PROPOFOL N/A 01/10/2015   Procedure: ESOPHAGOGASTRODUODENOSCOPY (EGD) WITH PROPOFOL;  Surgeon: Wallace Cullens, MD;  Location: [facility] ENDOSCOPY;  Service: Gastroenterology;  Laterality: N/A;   RECTAL SURGERY     RIGHT/LEFT HEART CATH AND CORONARY ANGIOGRAPHY Bilateral 04/08/2020   Procedure: RIGHT/LEFT HEART CATH AND CORONARY ANGIOGRAPHY;  Surgeon: Iran Ouch, MD;  Location: [facility] INVASIVE CV LAB;  Service: Cardiovascular;  Laterality: Bilateral;   SAVORY DILATION N/A 01/10/2015   Procedure: SAVORY DILATION;  Surgeon: Wallace Cullens, MD;  Location: [facility] ENDOSCOPY;  Service: Gastroenterology;  Laterality: N/A;   toenail removal Bilateral    great toes   TONSILLECTOMY AND ADENOIDECTOMY       Prior to Admission medications   Medication Sig Start Date End Date Taking? Authorizing Provider  acetaminophen (TYLENOL) 500 MG tablet Take 1,000 mg by mouth in the morning, at noon, and at bedtime.   Yes [provider]  albuterol (PROAIR HFA) 108 (90 Base) MCG/ACT inhaler INHALE 2 PUFFS INTO LUNGS EVERY 6 HOURS AS NEEDED FOR WHEEZING OR SHORTNESS OF BREATH Patient taking differently: Inhale 2 puffs into the lungs 2 (two) times daily as needed for shortness of breath. 07/05/20  Yes Lyndon Code, MD  alendronate (FOSAMAX) 70 MG tablet Take 70 mg by mouth every Tuesday.  01/11/19  Yes [provider]  aspirin EC 81 MG tablet Take 81 mg by mouth daily. Swallow whole.   Yes [provider]  Azelastine HCl 137 MCG/SPRAY SOLN Place 2 sprays into both nostrils in the morning and at bedtime.  09/06/19  Yes [provider]  Cyanocobalamin (VITAMIN B-12) 1000 MCG/15ML LIQD Take 1,200 mcg by mouth daily.   Yes [provider]  ergocalciferol (VITAMIN D2) 1.25 MG (50000 UT) capsule Take 1 capsule by mouth every 7 (seven) days. 01/16/20  Yes [provider]  FLUoxetine (PROZAC) 20 MG capsule Take 2 capsules (40 mg total) by mouth daily. 10/29/20  Yes Lyndon Code, MD  fluticasone [facility]) 50 MCG/ACT nasal spray Place 2 sprays into both nostrils in the morning and at bedtime.  03/13/19  Yes [provider]  guaifenesin (HUMIBID E) 400 MG TABS tablet Take 800 mg by mouth every 6 (six) hours as needed (congestion/cough).    Yes [provider]  hydrocortisone 2.5 % cream Place 1 application rectally 2 (two) times daily as needed (itching/hemorroids).    Yes [provider]  ipratropium-albuterol (DUONEB) 0.5-2.5 (3) MG/3ML SOLN One vial 3 x  aday for copd with hypoxia Patient taking differently: Inhale 3 mLs into the lungs in the morning and at bedtime. 02/27/20  Yes Lyndon Code, MD  levothyroxine (SYNTHROID) 88 MCG tablet Take  88 mcg by mouth daily before breakfast. Take 30 minutes before food and with a full glass of water   Yes [provider]  LORazepam (ATIVAN) 1 MG tablet Take 1 tablet (1 mg total) by mouth at bedtime. 10/29/20  Yes Lyndon Code, MD  Multiple Vitamin (MULTIVITAMIN WITH MINERALS) TABS tablet Take 1 tablet by mouth daily. Multivitamin for Seniors   Yes [provider]  rosuvastatin (CRESTOR) 20 MG tablet Take 1 tablet (20 mg total) by mouth daily. 03/12/20  Yes Lyndon Code, MD  SYMBICORT 160-4.5 MCG/ACT inhaler Inhale 2 puffs into the lungs 2 (two) times daily. 02/20/19  Yes [provider]  tiotropium (SPIRIVA) 18 MCG inhalation capsule Place 18 mcg into inhaler and inhale in the morning and at bedtime.   Yes [provider]  traMADol (ULTRAM) 50 MG tablet Take one tab po tid for pain Patient taking differently: Take 50 mg by mouth 2 (two) times daily as needed. Upon awakening for body stiffness and pain. May take additional dose if needed later in day. 11/07/20  Yes Lyndon Code, MD  albuterol (VENTOLIN HFA) 108 (90 Base) MCG/ACT inhaler Inhale 2 puffs into the lungs every 6 (six) hours as needed for wheezing or shortness of breath. 07/05/20   Lyndon Code, MD  mirtazapine (REMERON) 7.5 MG tablet Take 1 tablet (7.5 mg total) by mouth at bedtime. Patient not taking: Reported on 11/19/2020 10/29/20   Lyndon Code, MD    Allergies Celecoxib and Pregabalin  Family History  Problem Relation Age of Onset   Alcohol abuse Mother    Depression Mother    Varicose Veins Mother    Alcohol abuse Father    Depression Father    Alcohol abuse Sister    Asthma Sister    COPD Sister    Depression Sister    Heart disease Sister    Hypertension Sister    Diabetes Sister    Depression Maternal Grandmother    Diabetes Paternal Grandmother    Stroke Paternal Grandmother     Social History Social History   Tobacco Use   Smoking status: Former    Packs/day: 0.50     Years: 25.00    Pack years: 12.50    Types: Cigarettes    Quit date: 06/03/2016    Years since quitting: 4.4   Smokeless tobacco: Never  Vaping Use   Vaping Use: Never used  Substance Use Topics   Alcohol use: No    Alcohol/week: 0.0 standard drinks   Drug use: No    Review of Systems Constitutional: No fever/chills or recent illness Eyes: No visual changes. ENT: No sore throat.  No neck pain.  Nose was bleeding but stopped Cardiovascular: Denies chest pain. Respiratory: Denies shortness of breath.  Very slight wheezing, reports that is typical for her. Gastrointestinal: No abdominal pain.   Genitourinary: Negative for dysuria. Musculoskeletal: Negative for back pain.  See HPI Skin: Negative for rash except a small area of skin tear on her left elbow. Neurological: Negative for headaches, areas of focal weakness or numbness.    ____________________________________________   PHYSICAL EXAM:  VITAL SIGNS: ED Triage Vitals  Enc Vitals Group     BP 11/18/20 1807 (!) 205/85     Pulse Rate 11/18/20 1804 78     Resp 11/18/20 1804 16     Temp 11/18/20 1804 97.9 F (36.6 C)     Temp Source 11/18/20 1804 Oral     SpO2 11/18/20 1804 100 %     Weight 11/18/20 1806 118 lb 6.2 oz (53.7 kg)     Height 11/18/20 1806 5\' 6"  (1.676 m)     Head Circumference --      Peak Flow --      Pain Score 11/18/20 1806 8     Pain Loc --      Pain Edu? --      Excl. in GC? --     Constitutional: Alert and oriented. Well appearing and in no acute distress.  She does report moderate pain including headache pain in the left elbow and across the bridge of the nose Eyes: Conjunctivae are normal. Head: Atraumatic except she does have mild epistaxis nares bilaterally, no septal hematoma no deformity of the nose is apparent. Nose: No congestion/rhinnorhea.  Dried blood present Mouth/Throat: Mucous membranes are moist. Neck: No stridor.  No cervical thoracic or lumbar tenderness. Cardiovascular:  Normal rate, regular rhythm. Grossly normal heart sounds.  Good peripheral circulation. Respiratory: Normal respiratory effort.  No retractions. Lungs CTAB except for some very scant expiratory wheezing, does use home oxygen  currently on her baseline. Gastrointestinal: Soft and nontender. No distention. Musculoskeletal:   RIGHT Right upper extremity demonstrates normal strength, good use of all muscles. No edema bruising or contusions of the right shoulder/upper arm, right elbow, right forearm / hand. Full range of motion of the right right upper extremity without pain. No evidence of trauma. Strong radial pulse. Intact median/ulnar/radial neuro-muscular exam.  LEFT Left upper extremity demonstrates normal strength, good use of all muscles with some limitation due to pain in the left elbow. No edema bruising or contusions of the left shoulder/upper arm, left forearm / hand.  Patient does have moderate swelling overlying the olecranon, a very small skin tear over the outer forearm.  Reports tenderness over the olecranon process and there is moderate swelling and bruising present.  There is no obvious deformity and she is able to range the elbow but reports discomfort but not severe pain with range of motion.  Strong radial pulse. Intact median/ulnar/radial neuro-muscular exam.  Lower Extremities  No edema. Normal DP/PT pulses bilateral with good cap refill.  Normal neuro-motor function lower extremities bilateral.  RIGHT Right lower extremity demonstrates normal strength, good use of all muscles. No edema bruising or contusions of the right hip, right ankle. Full range of motion of the right lower extremity without pain except a little bit of discomfort through the full range of motion of the anterior knee where she also has a small amount of abrasion and minimal contusion.  No notable joint effusions.  No pain on axial loading.   LEFT Left lower extremity demonstrates normal strength, good use  of all muscles. No edema bruising or contusions of the hip,  knee, ankle. Full range of motion of the left lower extremity without pain. No pain on axial loading. No evidence of trauma.    Neurologic:  Normal speech and language. No gross focal neurologic deficits are appreciated.  Skin:  Skin is warm, dry and intact. No rash noted. Psychiatric: Mood and affect are normal. Speech and behavior are normal.  ____________________________________________   LABS (all labs ordered are listed, but only abnormal results are displayed)  Labs Reviewed  CBC - Abnormal; Notable for the following components:      Result Value   RBC 3.65 (*)    Hemoglobin 11.9 (*)    MCV 100.5 (*)    All other components within normal limits  BASIC METABOLIC PANEL - Abnormal; Notable for the following components:   Sodium 130 (*)    Chloride 92 (*)    CO2 34 (*)    Glucose, Bld 145 (*)    Anion gap 4 (*)    All other components within normal limits  CBG MONITORING, ED - Abnormal; Notable for the following components:   Glucose-Capillary 164 (*)    All other components within normal limits  RESP PANEL BY RT-PCR (FLU A&B, COVID) ARPGX2  PROTIME-INR  APTT  CBC  BASIC METABOLIC PANEL  APTT  PROTIME-INR  ____________________________________________  RADIOLOGY  DG Elbow Complete Left  Result Date: 11/18/2020 CLINICAL DATA:  Fall.  Elbow pain. EXAM: LEFT ELBOW - COMPLETE 3+ VIEW COMPARISON:  None. FINDINGS: Three views study shows and olecranon fracture with approximately 17 mm of proximal distraction of the dominant proximal fracture fragment. No other fracture evident. Lateral film shows elevation of the anterior posterior fat pads consistent with joint effusion. There is extensive overlying soft tissue edema/hemorrhage. IMPRESSION: Olecranon fracture with joint effusion and soft tissue edema/hemorrhage. Electronically Signed   By: Minerva Areola  Molli Posey M.D.   On: 11/18/2020 19:11   CT Head Wo Contrast  Result  Date: 11/18/2020 CLINICAL DATA:  Facial trauma fall EXAM: CT HEAD WITHOUT CONTRAST CT MAXILLOFACIAL WITHOUT CONTRAST CT CERVICAL SPINE WITHOUT CONTRAST TECHNIQUE: Multidetector CT imaging of the head, cervical spine, and maxillofacial structures were performed using the standard protocol without intravenous contrast. Multiplanar CT image reconstructions of the cervical spine and maxillofacial structures were also generated. COMPARISON:  CT brain 04/09/2016, CT cervical spine 11/24/2016, MRI brain 03/18/2019, CT brain 03/17/2019 FINDINGS: CT HEAD FINDINGS Brain: No acute territorial infarction, hemorrhage, or intracranial mass. The ventricles are nonenlarged. Vascular: No hyperdense vessels.  Carotid vascular calcification. Skull: Normal. Negative for fracture or focal lesion. Other: None CT MAXILLOFACIAL FINDINGS Osseous: Mandibular heads are normally position. No mandibular fracture. Mastoid air cells are clear. Pterygoid plates and zygomatic arches are intact. No acute nasal bone fracture Orbits: Negative. No traumatic or inflammatory finding. Sinuses: Clear. Soft tissues: Small left forehead scalp swelling CT CERVICAL SPINE FINDINGS Alignment: Straightening of the cervical spine. No subluxation. Facet alignment within normal limits Skull base and vertebrae: No acute fracture. No primary bone lesion or focal pathologic process. Soft tissues and spinal canal: No prevertebral fluid or swelling. No visible canal hematoma. Disc levels: Multilevel degenerative changes, most advanced at C5-C6. Upper chest: Apical emphysema Other: None IMPRESSION: 1. No CT evidence for acute intracranial abnormality. 2. No acute facial bone fracture identified 3. Straightening of the cervical spine without acute osseous abnormality 4. Emphysema Electronically Signed   By: Jasmine Pang M.D.   On: 11/18/2020 19:06   CT Cervical Spine Wo Contrast  Result Date: 11/18/2020 CLINICAL DATA:  Facial trauma fall EXAM: CT HEAD WITHOUT CONTRAST  CT MAXILLOFACIAL WITHOUT CONTRAST CT CERVICAL SPINE WITHOUT CONTRAST TECHNIQUE: Multidetector CT imaging of the head, cervical spine, and maxillofacial structures were performed using the standard protocol without intravenous contrast. Multiplanar CT image reconstructions of the cervical spine and maxillofacial structures were also generated. COMPARISON:  CT brain 04/09/2016, CT cervical spine 11/24/2016, MRI brain 03/18/2019, CT brain 03/17/2019 FINDINGS: CT HEAD FINDINGS Brain: No acute territorial infarction, hemorrhage, or intracranial mass. The ventricles are nonenlarged. Vascular: No hyperdense vessels.  Carotid vascular calcification. Skull: Normal. Negative for fracture or focal lesion. Other: None CT MAXILLOFACIAL FINDINGS Osseous: Mandibular heads are normally position. No mandibular fracture. Mastoid air cells are clear. Pterygoid plates and zygomatic arches are intact. No acute nasal bone fracture Orbits: Negative. No traumatic or inflammatory finding. Sinuses: Clear. Soft tissues: Small left forehead scalp swelling CT CERVICAL SPINE FINDINGS Alignment: Straightening of the cervical spine. No subluxation. Facet alignment within normal limits Skull base and vertebrae: No acute fracture. No primary bone lesion or focal pathologic process. Soft tissues and spinal canal: No prevertebral fluid or swelling. No visible canal hematoma. Disc levels: Multilevel degenerative changes, most advanced at C5-C6. Upper chest: Apical emphysema Other: None IMPRESSION: 1. No CT evidence for acute intracranial abnormality. 2. No acute facial bone fracture identified 3. Straightening of the cervical spine without acute osseous abnormality 4. Emphysema Electronically Signed   By: Jasmine Pang M.D.   On: 11/18/2020 19:06   Chest Portable 1 View  Result Date: 11/18/2020 CLINICAL DATA:  Fall EXAM: PORTABLE CHEST 1 VIEW COMPARISON:  CT chest 03/07/2020, radiograph 03/17/2019 FINDINGS: Hyperinflation with emphysema and mild  chronic bronchitic changes. No acute airspace disease, pleural effusion, or pneumothorax. Normal cardiomediastinal silhouette with aortic atherosclerosis. IMPRESSION: No active disease.  Emphysema and bronchitic changes Electronically Signed   By:  Jasmine PangKim  Fujinaga M.D.   On: 11/18/2020 20:49   DG Knee Complete 4 Views Right  Result Date: 11/18/2020 CLINICAL DATA:  Fall EXAM: RIGHT KNEE - COMPLETE 4+ VIEW COMPARISON:  None. FINDINGS: Acute nondisplaced fracture at the lower pole of the patella. Positive for knee effusion. No dislocation. Mild joint space calcification consistent with chondrocalcinosis. IMPRESSION: Acute nondisplaced patellar fracture with knee effusion. Electronically Signed   By: Jasmine PangKim  Fujinaga M.D.   On: 11/18/2020 19:11   CT Maxillofacial Wo Contrast  Result Date: 11/18/2020 CLINICAL DATA:  Facial trauma fall EXAM: CT HEAD WITHOUT CONTRAST CT MAXILLOFACIAL WITHOUT CONTRAST CT CERVICAL SPINE WITHOUT CONTRAST TECHNIQUE: Multidetector CT imaging of the head, cervical spine, and maxillofacial structures were performed using the standard protocol without intravenous contrast. Multiplanar CT image reconstructions of the cervical spine and maxillofacial structures were also generated. COMPARISON:  CT brain 04/09/2016, CT cervical spine 11/24/2016, MRI brain 03/18/2019, CT brain 03/17/2019 FINDINGS: CT HEAD FINDINGS Brain: No acute territorial infarction, hemorrhage, or intracranial mass. The ventricles are nonenlarged. Vascular: No hyperdense vessels.  Carotid vascular calcification. Skull: Normal. Negative for fracture or focal lesion. Other: None CT MAXILLOFACIAL FINDINGS Osseous: Mandibular heads are normally position. No mandibular fracture. Mastoid air cells are clear. Pterygoid plates and zygomatic arches are intact. No acute nasal bone fracture Orbits: Negative. No traumatic or inflammatory finding. Sinuses: Clear. Soft tissues: Small left forehead scalp swelling CT CERVICAL SPINE FINDINGS  Alignment: Straightening of the cervical spine. No subluxation. Facet alignment within normal limits Skull base and vertebrae: No acute fracture. No primary bone lesion or focal pathologic process. Soft tissues and spinal canal: No prevertebral fluid or swelling. No visible canal hematoma. Disc levels: Multilevel degenerative changes, most advanced at C5-C6. Upper chest: Apical emphysema Other: None IMPRESSION: 1. No CT evidence for acute intracranial abnormality. 2. No acute facial bone fracture identified 3. Straightening of the cervical spine without acute osseous abnormality 4. Emphysema Electronically Signed   By: Jasmine PangKim  Fujinaga M.D.   On: 11/18/2020 19:06    CT head cervical spine reviewed negative for acute finding.  Left elbow with olecranon fracture.  Right knee with nondisplaced patellar fracture ____________________________________________   PROCEDURES  Procedure(s) performed: None  Procedures  Critical Care performed: No  ____________________________________________   INITIAL IMPRESSION / ASSESSMENT AND PLAN / ED COURSE  Pertinent labs & imaging results that were available during my care of the patient were reviewed by me and considered in my medical decision making (see chart for details).  Patient reports mechanical fall.  Injuries as noted on assessment.   ----------------------------------------- 8:08 PM on 11/18/2020 ----------------------------------------- Patient reassessed fully alert reports pain is well controlled now.  She appears quite comfortable.  Patient understanding of plan for admission.  I discussed her case with orthopedics Dr. Joice LoftsPoggi who advises admission to the hospital if needed, advises left elbow will need surgery tomorrow.  He will see and evaluate further  Will place left elbow into a posterior splint, and right knee into bulky immobilizer.  Discussed with the patient we will admit due to anticipated immobility and high risk for falling with multiple  fractures.  Hospitalist Dr. Sedalia Mutaox excepting of admission.  CBC basic metabolic panel INR and PTT are pending  Clinical Course as of 11/19/20 0014  Mon Nov 18, 2020  2058 Sodium(!): 130 [MQ]  2058 Chloride(!): 92 [MQ]    Clinical Course User Index [MQ] Sharyn CreamerQuale, Corbitt Cloke, MD    Basic labs reviewed notable for mild hyponatremia.  Also slightly elevated  CO2 mild hypochloremia.  CBC reviewed, minimal anemia.   ____________________________________________   FINAL CLINICAL IMPRESSION(S) / ED DIAGNOSES  Final diagnoses:  Left elbow fracture, closed, initial encounter  Closed nondisplaced fracture of right patella, unspecified fracture morphology, initial encounter  Nosebleed  Fall, initial encounter        Note:  This document was prepared using Conservation officer, historic buildings and may include unintentional dictation errors       Sharyn Creamer, MD 11/19/20 0015

## 2020-11-18 NOTE — ED Notes (Signed)
Pt changed into gown and splint placed on left arm. Sling placed on left arm as well. And knee immobilizer placed on right knee.

## 2020-11-18 NOTE — ED Triage Notes (Signed)
Pt to ED via EMS from home. Pt here with fall today. Pt has swelling to left elbow. Pt also has a knot on the back of her head and a nose bleed. R knee also hurts. 183/84, 99% on 3L, 80, cbg-130.

## 2020-11-19 ENCOUNTER — Inpatient Hospital Stay: Payer: Medicare Other

## 2020-11-19 ENCOUNTER — Inpatient Hospital Stay: Payer: Medicare Other | Admitting: Certified Registered Nurse Anesthetist

## 2020-11-19 ENCOUNTER — Encounter: Payer: Self-pay | Admitting: Internal Medicine

## 2020-11-19 ENCOUNTER — Encounter
Admission: EM | Disposition: A | Discharge status: Skilled Nursing Facility | Payer: Self-pay | Source: Home / Self Care | Attending: Internal Medicine

## 2020-11-19 DIAGNOSIS — S82034A Nondisplaced transverse fracture of right patella, initial encounter for closed fracture: Secondary | ICD-10-CM | POA: Insufficient documentation

## 2020-11-19 DIAGNOSIS — S52032A Displaced fracture of olecranon process with intraarticular extension of left ulna, initial encounter for closed fracture: Secondary | ICD-10-CM | POA: Insufficient documentation

## 2020-11-19 HISTORY — PX: CAST APPLICATION: SHX380

## 2020-11-19 HISTORY — PX: ORIF ELBOW FRACTURE: SHX5031

## 2020-11-19 LAB — APTT: aPTT: 34 seconds (ref 24–36)

## 2020-11-19 LAB — CBC
HCT: 34.3 % — ABNORMAL LOW (ref 36.0–46.0)
Hemoglobin: 11.3 g/dL — ABNORMAL LOW (ref 12.0–15.0)
MCH: 32.7 pg (ref 26.0–34.0)
MCHC: 32.9 g/dL (ref 30.0–36.0)
MCV: 99.1 fL (ref 80.0–100.0)
Platelets: 170 10*3/uL (ref 150–400)
RBC: 3.46 MIL/uL — ABNORMAL LOW (ref 3.87–5.11)
RDW: 11.8 % (ref 11.5–15.5)
WBC: 8.6 10*3/uL (ref 4.0–10.5)
nRBC: 0 % (ref 0.0–0.2)

## 2020-11-19 LAB — BASIC METABOLIC PANEL
Anion gap: 4 — ABNORMAL LOW (ref 5–15)
BUN: 19 mg/dL (ref 8–23)
CO2: 33 mmol/L — ABNORMAL HIGH (ref 22–32)
Calcium: 9.3 mg/dL (ref 8.9–10.3)
Chloride: 91 mmol/L — ABNORMAL LOW (ref 98–111)
Creatinine, Ser: 0.62 mg/dL (ref 0.44–1.00)
GFR, Estimated: >60 mL/min (ref >60)
Glucose, Bld: 132 mg/dL — ABNORMAL HIGH (ref 70–99)
Potassium: 4.3 mmol/L (ref 3.5–5.1)
Sodium: 128 mmol/L — ABNORMAL LOW (ref 135–145)

## 2020-11-19 LAB — PROTIME-INR
INR: 0.9 (ref 0.8–1.2)
Prothrombin Time: 12.5 seconds (ref 11.4–15.2)

## 2020-11-19 SURGERY — OPEN REDUCTION INTERNAL FIXATION (ORIF) ELBOW/OLECRANON FRACTURE
Anesthesia: General | Site: Elbow | Laterality: Left

## 2020-11-19 MED ORDER — BUPIVACAINE HCL (PF) 0.5 % IJ SOLN
INTRAMUSCULAR | Status: DC | PRN
  Administered 2020-11-19: 20 mL

## 2020-11-19 MED ORDER — METOCLOPRAMIDE HCL 5 MG/ML IJ SOLN: 5.0000 mg | Freq: Three times a day (TID) | INTRAMUSCULAR | Status: DC | PRN

## 2020-11-19 MED ORDER — METOCLOPRAMIDE HCL 10 MG PO TABS: 5.0000 mg | ORAL_TABLET | Freq: Three times a day (TID) | ORAL | Status: DC | PRN

## 2020-11-19 MED ORDER — CEFAZOLIN SODIUM-DEXTROSE 2-4 GM/100ML-% IV SOLN
2.0000 g | Freq: Four times a day (QID) | INTRAVENOUS | Status: AC
  Administered 2020-11-19 – 2020-11-20 (×3): 2 g via INTRAVENOUS
  Filled 2020-11-19 (×3): qty 100

## 2020-11-19 MED ORDER — EPHEDRINE SULFATE 50 MG/ML IJ SOLN
INTRAMUSCULAR | Status: DC | PRN
  Administered 2020-11-19: 10 mg via INTRAVENOUS
  Administered 2020-11-19: 5 mg via INTRAVENOUS

## 2020-11-19 MED ORDER — LACTATED RINGERS IV SOLN: INTRAVENOUS | Status: DC | PRN

## 2020-11-19 MED ORDER — 0.9 % SODIUM CHLORIDE (POUR BTL) OPTIME
TOPICAL | Status: DC | PRN
  Administered 2020-11-19: 600 mL

## 2020-11-19 MED ORDER — GUAIFENESIN 200 MG PO TABS
800.0000 mg | ORAL_TABLET | Freq: Four times a day (QID) | ORAL | Status: DC | PRN
  Filled 2020-11-19: qty 4

## 2020-11-19 MED ORDER — OXYCODONE HCL 5 MG PO TABS: 5.0000 mg | ORAL_TABLET | Freq: Once | ORAL | Status: DC | PRN

## 2020-11-19 MED ORDER — DEXAMETHASONE SODIUM PHOSPHATE 10 MG/ML IJ SOLN
INTRAMUSCULAR | Status: DC | PRN
  Administered 2020-11-19: 5 mg via INTRAVENOUS

## 2020-11-19 MED ORDER — MORPHINE SULFATE (PF) 2 MG/ML IV SOLN
0.5000 mg | INTRAVENOUS | Status: DC | PRN
  Administered 2020-11-20: 1 mg via INTRAVENOUS
  Filled 2020-11-19: qty 1

## 2020-11-19 MED ORDER — FENTANYL CITRATE (PF) 100 MCG/2ML IJ SOLN
25.0000 ug | INTRAMUSCULAR | Status: DC | PRN
  Administered 2020-11-19 (×2): 25 ug via INTRAVENOUS

## 2020-11-19 MED ORDER — TRAMADOL HCL 50 MG PO TABS
50.0000 mg | ORAL_TABLET | Freq: Four times a day (QID) | ORAL | Status: DC | PRN
  Administered 2020-11-20 (×2): 50 mg via ORAL
  Filled 2020-11-19 (×2): qty 1

## 2020-11-19 MED ORDER — IPRATROPIUM-ALBUTEROL 0.5-2.5 (3) MG/3ML IN SOLN
3.0000 mL | Freq: Once | RESPIRATORY_TRACT | Status: AC | PRN
  Administered 2020-11-19: 3 mL via RESPIRATORY_TRACT

## 2020-11-19 MED ORDER — MAGNESIUM HYDROXIDE 400 MG/5ML PO SUSP: 30.0000 mL | Freq: Every day | ORAL | Status: DC | PRN

## 2020-11-19 MED ORDER — ONDANSETRON HCL 4 MG/2ML IJ SOLN: 4.0000 mg | Freq: Four times a day (QID) | INTRAMUSCULAR | Status: DC | PRN

## 2020-11-19 MED ORDER — PROPOFOL 10 MG/ML IV BOLUS
INTRAVENOUS | Status: DC | PRN
  Administered 2020-11-19: 100 mg via INTRAVENOUS

## 2020-11-19 MED ORDER — SEVOFLURANE IN SOLN
RESPIRATORY_TRACT | Status: AC
  Filled 2020-11-19: qty 250

## 2020-11-19 MED ORDER — SODIUM CHLORIDE 0.9 % IV SOLN: INTRAVENOUS | Status: DC

## 2020-11-19 MED ORDER — SODIUM CHLORIDE 0.9 % IV SOLN
INTRAVENOUS | Status: DC | PRN
  Administered 2020-11-19: 50 ug/min via INTRAVENOUS

## 2020-11-19 MED ORDER — LIDOCAINE HCL (CARDIAC) PF 100 MG/5ML IV SOSY
PREFILLED_SYRINGE | INTRAVENOUS | Status: DC | PRN
  Administered 2020-11-19: 60 mg via INTRAVENOUS

## 2020-11-19 MED ORDER — ONDANSETRON HCL 4 MG/2ML IJ SOLN
INTRAMUSCULAR | Status: DC | PRN
  Administered 2020-11-19: 4 mg via INTRAVENOUS

## 2020-11-19 MED ORDER — CEFAZOLIN SODIUM-DEXTROSE 2-4 GM/100ML-% IV SOLN
INTRAVENOUS | Status: AC
  Filled 2020-11-19: qty 100

## 2020-11-19 MED ORDER — MORPHINE SULFATE (PF) 2 MG/ML IV SOLN
0.5000 mg | INTRAVENOUS | Status: DC | PRN
  Administered 2020-11-19: 1 mg via INTRAVENOUS
  Filled 2020-11-19 (×2): qty 1

## 2020-11-19 MED ORDER — VITAMIN B-12 1000 MCG PO TABS
1000.0000 ug | ORAL_TABLET | Freq: Every day | ORAL | Status: DC
  Administered 2020-11-20 – 2020-11-21 (×2): 1000 ug via ORAL
  Filled 2020-11-19 (×3): qty 1

## 2020-11-19 MED ORDER — ROCURONIUM BROMIDE 100 MG/10ML IV SOLN
INTRAVENOUS | Status: DC | PRN
  Administered 2020-11-19: 40 mg via INTRAVENOUS

## 2020-11-19 MED ORDER — BISACODYL 10 MG RE SUPP: 10.0000 mg | Freq: Every day | RECTAL | Status: DC | PRN

## 2020-11-19 MED ORDER — ENOXAPARIN SODIUM 40 MG/0.4ML IJ SOSY
40.0000 mg | PREFILLED_SYRINGE | INTRAMUSCULAR | Status: DC
  Administered 2020-11-20 – 2020-11-21 (×2): 40 mg via SUBCUTANEOUS
  Filled 2020-11-19 (×2): qty 0.4

## 2020-11-19 MED ORDER — DIPHENHYDRAMINE HCL 12.5 MG/5ML PO ELIX: 12.5000 mg | ORAL_SOLUTION | ORAL | Status: DC | PRN

## 2020-11-19 MED ORDER — PHENYLEPHRINE HCL (PRESSORS) 10 MG/ML IV SOLN
INTRAVENOUS | Status: DC | PRN
  Administered 2020-11-19: 200 ug via INTRAVENOUS
  Administered 2020-11-19: 100 ug via INTRAVENOUS
  Administered 2020-11-19: 200 ug via INTRAVENOUS

## 2020-11-19 MED ORDER — ACETAMINOPHEN 325 MG PO TABS: 325.0000 mg | ORAL_TABLET | Freq: Four times a day (QID) | ORAL | Status: DC | PRN

## 2020-11-19 MED ORDER — HYDROCODONE-ACETAMINOPHEN 7.5-325 MG PO TABS
1.0000 | ORAL_TABLET | ORAL | Status: DC | PRN
  Administered 2020-11-19 – 2020-11-20 (×4): 2 via ORAL
  Filled 2020-11-19 (×5): qty 2

## 2020-11-19 MED ORDER — IPRATROPIUM-ALBUTEROL 0.5-2.5 (3) MG/3ML IN SOLN
RESPIRATORY_TRACT | Status: AC
  Filled 2020-11-19: qty 3

## 2020-11-19 MED ORDER — HYDROCODONE-ACETAMINOPHEN 5-325 MG PO TABS
1.0000 | ORAL_TABLET | ORAL | Status: DC | PRN
  Administered 2020-11-19: 2 via ORAL
  Filled 2020-11-19: qty 2

## 2020-11-19 MED ORDER — FENTANYL CITRATE (PF) 100 MCG/2ML IJ SOLN
INTRAMUSCULAR | Status: AC
  Administered 2020-11-19: 25 ug via INTRAVENOUS
  Filled 2020-11-19: qty 2

## 2020-11-19 MED ORDER — ACETAMINOPHEN 10 MG/ML IV SOLN
INTRAVENOUS | Status: AC
  Filled 2020-11-19: qty 100

## 2020-11-19 MED ORDER — FENTANYL CITRATE (PF) 100 MCG/2ML IJ SOLN
INTRAMUSCULAR | Status: DC | PRN
  Administered 2020-11-19 (×2): 50 ug via INTRAVENOUS

## 2020-11-19 MED ORDER — ACETAMINOPHEN 10 MG/ML IV SOLN
INTRAVENOUS | Status: DC | PRN
  Administered 2020-11-19: 1000 mg via INTRAVENOUS

## 2020-11-19 MED ORDER — ONDANSETRON HCL 4 MG PO TABS: 4.0000 mg | ORAL_TABLET | Freq: Four times a day (QID) | ORAL | Status: DC | PRN

## 2020-11-19 MED ORDER — SUGAMMADEX SODIUM 200 MG/2ML IV SOLN
INTRAVENOUS | Status: DC | PRN
  Administered 2020-11-19: 200 mg via INTRAVENOUS

## 2020-11-19 MED ORDER — OXYCODONE HCL 5 MG/5ML PO SOLN: 5.0000 mg | Freq: Once | ORAL | Status: DC | PRN

## 2020-11-19 MED ORDER — DOCUSATE SODIUM 100 MG PO CAPS
100.0000 mg | ORAL_CAPSULE | Freq: Two times a day (BID) | ORAL | Status: DC
  Administered 2020-11-19 – 2020-11-21 (×4): 100 mg via ORAL
  Filled 2020-11-19 (×4): qty 1

## 2020-11-19 MED ORDER — ACETAMINOPHEN 500 MG PO TABS
500.0000 mg | ORAL_TABLET | Freq: Four times a day (QID) | ORAL | Status: AC
  Administered 2020-11-19 – 2020-11-20 (×4): 500 mg via ORAL
  Filled 2020-11-19 (×4): qty 1

## 2020-11-19 MED ORDER — FLEET ENEMA 7-19 GM/118ML RE ENEM: 1.0000 | ENEMA | Freq: Once | RECTAL | Status: DC | PRN

## 2020-11-19 MED ORDER — CHLORHEXIDINE GLUCONATE 0.12 % MT SOLN
OROMUCOSAL | Status: AC
  Filled 2020-11-19: qty 15

## 2020-11-19 MED ORDER — FENTANYL CITRATE (PF) 100 MCG/2ML IJ SOLN
INTRAMUSCULAR | Status: AC
  Filled 2020-11-19: qty 2

## 2020-11-19 SURGICAL SUPPLY — 62 items
BIT DRILL 2.5X2.75 QC CALB (BIT) ×3 IMPLANT
BNDG ELASTIC 4X5.8 VLCR NS LF (GAUZE/BANDAGES/DRESSINGS) ×3 IMPLANT
BRACE KNEE POST OP SHORT (BRACE) ×3 IMPLANT
CANISTER SUCT 1200ML W/VALVE (MISCELLANEOUS) IMPLANT
CHLORAPREP W/TINT 26 (MISCELLANEOUS) ×3 IMPLANT
CUFF TOURN SGL QUICK 18X4 (TOURNIQUET CUFF) ×3 IMPLANT
CUFF TOURN SGL QUICK 24 (TOURNIQUET CUFF)
CUFF TRNQT CYL 24X4X16.5-23 (TOURNIQUET CUFF) IMPLANT
DRAPE 3/4 80X56 (DRAPES) ×3 IMPLANT
DRAPE C-ARM XRAY 36X54 (DRAPES) ×6 IMPLANT
DRAPE U-SHAPE 47X51 STRL (DRAPES) ×3 IMPLANT
ELECT CAUTERY BLADE 6.4 (BLADE) ×3 IMPLANT
ELECT REM PT RETURN 9FT ADLT (ELECTROSURGICAL) ×3
ELECTRODE REM PT RTRN 9FT ADLT (ELECTROSURGICAL) ×2 IMPLANT
FIBER TAPE 2MM (SUTURE) ×3 IMPLANT
GAUZE 4X4 16PLY ~~LOC~~+RFID DBL (SPONGE) ×3 IMPLANT
GAUZE SPONGE 4X4 12PLY STRL (GAUZE/BANDAGES/DRESSINGS) ×3 IMPLANT
GAUZE XEROFORM 1X8 LF (GAUZE/BANDAGES/DRESSINGS) ×3 IMPLANT
GLOVE SRG 8 PF TXTR STRL LF DI (GLOVE) ×2 IMPLANT
GLOVE SURG ENC MOIS LTX SZ7.5 (GLOVE) IMPLANT
GLOVE SURG ENC MOIS LTX SZ8 (GLOVE) ×9 IMPLANT
GLOVE SURG UNDER LTX SZ8 (GLOVE) ×3 IMPLANT
GLOVE SURG UNDER POLY LF SZ8 (GLOVE) ×3
GOWN SRG 2XL LVL 4 RGLN SLV (GOWNS) ×2 IMPLANT
GOWN STRL NON-REIN 2XL LVL4 (GOWNS) ×3
GOWN STRL REUS W/ TWL LRG LVL3 (GOWN DISPOSABLE) ×4 IMPLANT
GOWN STRL REUS W/TWL LRG LVL3 (GOWN DISPOSABLE) ×6
K-WIRE FIXATION 2.0X6 (WIRE) ×3
KIT TURNOVER KIT A (KITS) ×3 IMPLANT
KWIRE FIXATION 2.0X6 (WIRE) ×2 IMPLANT
MANIFOLD NEPTUNE II (INSTRUMENTS) ×3 IMPLANT
NEEDLE FILTER BLUNT 18X 1/2SAF (NEEDLE) ×1
NEEDLE FILTER BLUNT 18X1 1/2 (NEEDLE) ×2 IMPLANT
NS IRRIG 500ML POUR BTL (IV SOLUTION) ×3 IMPLANT
PACK EXTREMITY ARMC (MISCELLANEOUS) ×3 IMPLANT
PAD ABD DERMACEA PRESS 5X9 (GAUZE/BANDAGES/DRESSINGS) ×6 IMPLANT
PAD CAST CTTN 4X4 STRL (SOFTGOODS) ×6 IMPLANT
PAD PREP 24X41 OB/GYN DISP (PERSONAL CARE ITEMS) IMPLANT
PADDING CAST COTTON 4X4 STRL (SOFTGOODS) ×9
PLATE OLECRANON SM (Plate) ×3 IMPLANT
SCALPEL PROTECTED #15 DISP (BLADE) IMPLANT
SCREW CORTICAL 3.5MM  20MM (Screw) ×3 IMPLANT
SCREW CORTICAL 3.5MM 20MM (Screw) ×2 IMPLANT
SCREW CORTICAL 3.5MM 22MM (Screw) ×3 IMPLANT
SCREW LOCK 3.5X24 DIST TIB (Screw) ×3 IMPLANT
SCREW LOCK CORT STAR 3.5X18 (Screw) ×3 IMPLANT
SCREW LOCK CORT STAR 3.5X20 (Screw) ×3 IMPLANT
SCREW LOCK CORT STAR 3.5X22 (Screw) ×3 IMPLANT
SCREW LOCK CORT STAR 3.5X24 (Screw) ×3 IMPLANT
SCREW LP 3.5 (Screw) ×3 IMPLANT
SLING ARM M TX990204 (SOFTGOODS) ×3 IMPLANT
SPLINT CAST 1 STEP 4X30 (MISCELLANEOUS) ×3 IMPLANT
SPLINT CAST 1 STEP 5X30 WHT (MISCELLANEOUS) IMPLANT
SPONGE T-LAP 18X18 ~~LOC~~+RFID (SPONGE) ×3 IMPLANT
STAPLER SKIN PROX 35W (STAPLE) ×3 IMPLANT
STRIP CLOSURE SKIN 1/2X4 (GAUZE/BANDAGES/DRESSINGS) IMPLANT
SUT ETHILON 3-0 FS-10 30 BLK (SUTURE) ×3
SUT VIC AB 0 CT2 27 (SUTURE) ×3 IMPLANT
SUT VIC AB 2-0 CT2 27 (SUTURE) ×6 IMPLANT
SUTURE EHLN 3-0 FS-10 30 BLK (SUTURE) ×2 IMPLANT
SYR 5ML LL (SYRINGE) ×3 IMPLANT
TAPE TRANSPORE STRL 2 31045 (GAUZE/BANDAGES/DRESSINGS) ×3 IMPLANT

## 2020-11-19 NOTE — Anesthesia Postprocedure Evaluation (Signed)
Anesthesia Post Note  Patient: [name]  Procedure(s) Performed: OPEN REDUCTION INTERNAL FIXATION (ORIF) ELBOW/OLECRANON FRACTURE (Left: Elbow)  Patient location during evaluation: PACU Anesthesia Type: General Level of consciousness: awake and alert Pain management: pain level controlled Vital Signs Assessment: post-procedure vital signs reviewed and stable Respiratory status: spontaneous breathing, nonlabored ventilation, respiratory function stable and patient connected to nasal cannula oxygen Cardiovascular status: blood pressure returned to baseline and stable Postop Assessment: no apparent nausea or vomiting Anesthetic complications: no   No notable events documented.   Last Vitals:  Vitals:   11/19/20 1157 11/19/20 1214  BP: (!) 110/55 (!) 129/55  Pulse: 84 80  Resp: 15 16  Temp:  36.6 C  SpO2: 97% 97%    Last Pain:  Vitals:   11/19/20 1146  TempSrc:   PainSc: 4                  Karleen Hampshire

## 2020-11-19 NOTE — Anesthesia Preprocedure Evaluation (Addendum)
Anesthesia Evaluation  Patient identified by MRN, date of birth, ID band Patient awake    Reviewed: Allergy & Precautions, H&P , NPO status , Patient's Chart, lab work & pertinent test results  History of Anesthesia Complications Negative for: history of anesthetic complications  Airway Mallampati: II  TM Distance: >3 FB     Dental  (+) Upper Dentures   Pulmonary shortness of breath, sleep apnea , COPD (wears 2L around the clock),  COPD inhaler and oxygen dependent, former smoker,  Mild pulmonary hypertension    + wheezing      Cardiovascular Exercise Tolerance: Poor hypertension, (-) angina+CHF  (-) Past MI and (-) Cardiac Stents Normal cardiovascular exam+ dysrhythmias (paroxysmal SVT) Supra Ventricular Tachycardia   LHC 04/08/20: -There is hyperdynamic left ventricular systolic function. -LV end diastolic pressure is mildly elevated. -The left ventricular ejection fraction is greater than 65% by visual estimate. -Prox Cx to Mid Cx lesion is 20% stenosed.   1.  Mildly calcified coronary arteries with mild nonobstructive disease. 2.  Hyperdynamic LV systolic function with an EF of greater than 70%. 3.  Right heart catheterization showed mildly elevated filling pressures, mild pulmonary hypertension and normal cardiac output.  RA: 10 mmHg RV: 50/5 mmHg PCW: 16 mmHg PA: 43/17 with a mean of 30 mmHg Cardiac output: 4.40 with a cardiac index of 2.76.  Recommendations: Recommend medical therapy for nonobstructive coronary artery disease. Pulmonary hypertension does not seem to be severe enough to require vasodilator therapy. A small dose diuretic can be considered as a trial. Suspect that the majority of her exertional dyspnea is related to underlying lung disease.  -There is hyperdynamic left ventricular systolic function. -LV end diastolic pressure is mildly elevated. -The left ventricular ejection fraction is greater  than 65% by visual estimate. -Prox Cx to Mid Cx lesion is 20% stenosed.   1.  Mildly calcified coronary arteries with mild nonobstructive disease. 2.  Hyperdynamic LV systolic function with an EF of greater than 70%. 3.  Right heart catheterization showed mildly elevated filling pressures, mild pulmonary hypertension and normal cardiac output.  RA: 10 mmHg RV: 50/5 mmHg PCW: 16 mmHg PA: 43/17 with a mean of 30 mmHg Cardiac output: 4.40 with a cardiac index of 2.76.  Recommendations: Recommend medical therapy for nonobstructive coronary artery disease. Pulmonary hypertension does not seem to be severe enough to require vasodilator therapy. A small dose diuretic can be considered as a trial. Suspect that the majority of her exertional dyspnea is related to underlying lung disease.     Neuro/Psych  Headaches, PSYCHIATRIC DISORDERS Anxiety Depression    GI/Hepatic negative GI ROS, Neg liver ROS,   Endo/Other  diabetesHypothyroidism   Renal/GU      Musculoskeletal  (+) Arthritis , Fibromyalgia -  Abdominal   Peds  Hematology negative hematology ROS (+)   Anesthesia Other Findings Past Medical History: No date: Anxiety No date: Chest pain No date: CHF (congestive heart failure) (HCC) No date: COPD (chronic obstructive pulmonary disease) (HCC) No date: Depression No date: Diabetes mellitus without complication (HCC) No date: Fibromyalgia No date: Graves disease No date: Hypertension No date: IBS (irritable bowel syndrome) No date: PTSD (post-traumatic stress disorder) No date: Sinus problem No date: Spinal stenosis  Past Surgical History: No date: ABDOMINAL HYSTERECTOMY 01/10/2015: ESOPHAGOGASTRODUODENOSCOPY (EGD) WITH PROPOFOL; N/A     Comment:  Procedure: ESOPHAGOGASTRODUODENOSCOPY (EGD) WITH               PROPOFOL;  Surgeon: Wallace Cullens, MD;  Location: [facility]               ENDOSCOPY;  Service: Gastroenterology;  Laterality: N/A; No date: RECTAL  SURGERY 04/08/2020: RIGHT/LEFT HEART CATH AND CORONARY ANGIOGRAPHY; Bilateral     Comment:  Procedure: RIGHT/LEFT HEART CATH AND CORONARY               ANGIOGRAPHY;  Surgeon: Iran Ouch, MD;  Location:               [facility] INVASIVE CV LAB;  Service: Cardiovascular;                Laterality: Bilateral; 01/10/2015: SAVORY DILATION; N/A     Comment:  Procedure: SAVORY DILATION;  Surgeon: Wallace Cullens, MD;                Location: [facility] ENDOSCOPY;  Service: Gastroenterology;                Laterality: N/A; No date: toenail removal; Bilateral     Comment:  great toes No date: TONSILLECTOMY AND ADENOIDECTOMY  BMI    Body Mass Index: 19.11 kg/m      Reproductive/Obstetrics negative OB ROS                           Anesthesia Physical Anesthesia Plan  ASA: 4  Anesthesia Plan: General ETT   Post-op Pain Management:    Induction:   PONV Risk Score and Plan: Ondansetron, Dexamethasone and Treatment may vary due to age or medical condition  Airway Management Planned:   Additional Equipment:   Intra-op Plan:   Post-operative Plan:   Informed Consent: I have reviewed the patients History and Physical, chart, labs and discussed the procedure including the risks, benefits and alternatives for the proposed anesthesia with the patient or authorized representative who has indicated his/her understanding and acceptance.     Dental Advisory Given  Plan Discussed with: Anesthesiologist, CRNA and Surgeon  Anesthesia Plan Comments:        Anesthesia Quick Evaluation

## 2020-11-19 NOTE — ED Notes (Signed)
Report give to Same day surgery

## 2020-11-19 NOTE — Anesthesia Procedure Notes (Signed)
Procedure Name: Intubation Date/Time: 11/19/2020 8:37 AM Performed by: Louann Sjogren, CRNA Pre-anesthesia Checklist: Patient identified, Patient being monitored, Timeout performed, Emergency Drugs available and Suction available Patient Re-evaluated:Patient Re-evaluated prior to induction Oxygen Delivery Method: Circle system utilized Preoxygenation: Pre-oxygenation with 100% oxygen Induction Type: IV induction Ventilation: Mask ventilation without difficulty Laryngoscope Size: Mac and 3 Grade View: Grade I Tube type: Oral Tube size: 7.0 mm Number of attempts: 1 Airway Equipment and Method: Stylet Placement Confirmation: ETT inserted through vocal cords under direct vision, positive ETCO2 and breath sounds checked- equal and bilateral Secured at: 21 cm Tube secured with: Tape Dental Injury: Teeth and Oropharynx as per pre-operative assessment

## 2020-11-19 NOTE — Evaluation (Signed)
Occupational Therapy Evaluation Patient Details Name: [name] MRN: [id number] DOB: 07-20-1945 Today's Date: 11/19/2020    History of Present Illness [name] is a 75 y.o. female who is now s/p ORIF of displaced L olecranon fx, and closed nondisplaced patella fracture that she sustained after a fall at home (pt to wear hinged knee brace locked in extension on RLE). Pt PMH includes diabetes, congestive heart failure, COPD, Graves' disease, hypertension, anxiety/depression, irritable bowel syndrome, and PTSD.   Clinical Impression   [name] was seen for OT evaluation this date. Prior to hospital admission, pt was MOD I for BADL management, using a 4WW for functional mobility. She reports living alone in a 2nd floor apartment with an elevator to enter. Pt lives alone and has a PCA who assists her for at least 2 hours/week. Currently pt with MD orders for her LUE to remain NWB in splint, and her RLE to be WBAT with knee immobilizer on at all times, locked in extension. Pt demonstrates increased pain with mobility and decreased functional use of her LUE/RLE, which functionally limit her ability to perform ADL/self-care tasks. Pt currently requires MOD - MAX A for LB ADL management in seated position as well as MAX A for donning sling this date.  Pt would benefit from skilled OT services to address noted impairments and functional limitations (see below for any additional details) in order to maximize safety and independence while minimizing falls risk and caregiver burden. Upon hospital discharge, recommend STR to maximize pt safety and return to PLOF.       Follow Up Recommendations  SNF;Supervision/Assistance - 24 hour    Equipment Recommendations       Recommendations for Other Services       Precautions / Restrictions Precautions Precautions: Fall Restrictions Weight Bearing Restrictions: Yes LUE Weight Bearing: Non weight bearing RLE Weight Bearing: Weight bearing as  tolerated Other Position/Activity Restrictions: Knee immobilizer on at all times, locked in extension.      Mobility Bed Mobility Overal bed mobility: Needs Assistance Bed Mobility: Supine to Sit;Sit to Supine     Supine to sit: Min assist Sit to supine: Min assist   General bed mobility comments: Min A for assist with RLE over EOB sup<>sit.    Transfers                 General transfer comment: Deferred for pt comfort/safety at this time.    Balance Overall balance assessment: Needs assistance Sitting-balance support: Single extremity supported;Feet supported (LLE supported.) Sitting balance-Leahy Scale: Fair Sitting balance - Comments: Pt limited 2/2 LUE/RLE decreased functional use.                                   ADL either performed or assessed with clinical judgement   ADL Overall ADL's : Needs assistance/impaired Eating/Feeding: Set up;Minimal assistance Eating/Feeding Details (indicate cue type and reason): MIN A for mgt of 2-handed meal tray items. Educated on compensatory strategies for one-handed eating Grooming: Sitting;Supervision/safety;Set up                                 General ADL Comments: Pt significantly limited by decreased functional use of her RLE/LUE this date. She requires MIN A to sit EOB. Anticipate MOD/-MAX A for LB dressing/bathing tasks in seated position. MOD A for UB dressing/bathing with MAX  A to don sling on LUE. MIN A for self-feeding with assist for 2-handed food items.     Vision Baseline Vision/History: Wears glasses Wears Glasses: At all times Patient Visual Report: No change from baseline       Perception     Praxis      Pertinent Vitals/Pain Pain Assessment: Faces Faces Pain Scale: Hurts whole lot Pain Location: Increased discomfort with movement of LUE/LLE Pain Descriptors / Indicators: Aching;Sore Pain Intervention(s): Limited activity within patient's tolerance;Monitored during  session;Repositioned;RN gave pain meds during session     Hand Dominance Right   Extremity/Trunk Assessment Upper Extremity Assessment Upper Extremity Assessment: LUE deficits/detail;RUE deficits/detail RUE Deficits / Details: WFL LUE Deficits / Details: s/p ORIF L olecranon fx. with orders to remain NWB. Elbow splinted.   Lower Extremity Assessment Lower Extremity Assessment: RLE deficits/detail RLE Deficits / Details: Per MD orders, pt WBAT through RLE with knee hinge brace on and locked in extension. RLE: Unable to fully assess due to pain;Unable to fully assess due to immobilization RLE Coordination: decreased gross motor   Cervical / Trunk Assessment Cervical / Trunk Assessment: Normal   Communication Communication Communication: No difficulties   Cognition Arousal/Alertness: Awake/alert Behavior During Therapy: WFL for tasks assessed/performed Overall Cognitive Status: Within Functional Limits for tasks assessed                                 General Comments: Pt pleasant, conversational, eager to participate in therapy session.   General Comments  Pt on 2L [place] (baseline) during session. VS remain WFL, pt denies adverse s/s.    Exercises Other Exercises Other Exercises: Pt/caregiver (PCA prsents to bedside during session) educated on role of OT in acute setting, compensatory strategies for one handed self-feeding/self-care, LLE/LUE weight bearing status, safe use of AE/DME and routines modifications to support safety and functional indep upon DC.   Shoulder Instructions      Home Living Family/patient expects to be discharged to:: Private residence Living Arrangements: Alone Available Help at Discharge: Personal care attendant;Available PRN/intermittently Type of Home: Apartment Home Access: Stairs to enter;Elevator Entrance Stairs-Number of Steps: Flight, pt on second floor. Entrance Stairs-Rails: Can reach both;Left;Right Home Layout: One level      Bathroom Shower/Tub: Chief Strategy Officer: Standard Bathroom Accessibility: Yes How Accessible: Accessible via walker Home Equipment: Shower seat;Walker - 4 wheels;Cane - single point;Hand held shower head          Prior Functioning/Environment Level of Independence: Needs assistance  Gait / Transfers Assistance Needed: Pt uses 4WW for functional mobility in the home and community. Pt reports that she generally stays home but does go out with PCA for Dr.'s apts and grocery shopping. ADL's / Homemaking Assistance Needed: Pt states she is generally independent with ADL management, PCA occasionally assists with bathing if pt fatigued. Pt states she has been more limited with IADL management since being placed on O2 in the home. Pt on 2L at baseline.            OT Problem List: Decreased strength;Decreased coordination;Pain;Decreased range of motion;Decreased activity tolerance;Decreased safety awareness;Impaired balance (sitting and/or standing);Decreased knowledge of use of DME or AE;Impaired UE functional use;Decreased knowledge of precautions      OT Treatment/Interventions: Self-care/ADL training;Therapeutic exercise;Therapeutic activities;DME and/or AE instruction;Patient/family education;Balance training;Energy conservation    OT Goals(Current goals can be found in the care plan section) Acute Rehab OT Goals Patient Stated Goal:  To get back to being more independent. OT Goal Formulation: With patient Time For Goal Achievement: 12/03/20 Potential to Achieve Goals: Good  OT Frequency: Min 2X/week   Barriers to D/C: Decreased caregiver support          Co-evaluation              AM-PAC OT "6 Clicks" Daily Activity     Outcome Measure Help from another person eating meals?: A Little Help from another person taking care of personal grooming?: A Little Help from another person toileting, which includes using toliet, bedpan, or urinal?: A Lot Help from  another person bathing (including washing, rinsing, drying)?: A Lot Help from another person to put on and taking off regular upper body clothing?: A Lot Help from another person to put on and taking off regular lower body clothing?: A Lot 6 Click Score: 14   End of Session Equipment Utilized During Treatment: Gait belt;Rolling walker  Activity Tolerance: Patient tolerated treatment well Patient left: in bed;with call bell/phone within reach;with bed alarm set;with family/visitor present;with SCD's reapplied  OT Visit Diagnosis: Other abnormalities of gait and mobility (R26.89);History of falling (Z91.81);Pain Pain - Right/Left: Left Pain - part of body: Arm;Shoulder                Time: 5366-4403 OT Time Calculation (min): 43 min Charges:  OT General Charges $OT Visit: 1 Visit OT Evaluation $OT Eval Moderate Complexity: 1 Mod OT Treatments $Self Care/Home Management : 23-37 mins  Rockney Ghee, M.S., OTR/L Ascom: [phone] 11/19/20, 3:50 PM

## 2020-11-19 NOTE — Progress Notes (Signed)
1       [place] [facility]   PATIENT NAME: [name]    MR#:  [id number]  Lyndon CodeKhan, Fozia M, MD  DATE OF BIRTH:  02-22-1946  SUBJECTIVE:  CHIEF COMPLAINT:   Chief Complaint  Patient presents with   Fall  Requesting pain medication.  OT at bedside and so is her caregiver.  Thankful for her care here REVIEW OF SYSTEMS:  Review of Systems  Constitutional:  Negative for chills, fever and weight loss.  HENT:  Negative for nosebleeds and sore throat.   Eyes:  Negative for blurred vision.  Respiratory:  Negative for cough, shortness of breath and wheezing.   Cardiovascular:  Negative for chest pain, orthopnea, leg swelling and PND.  Gastrointestinal:  Negative for abdominal pain, constipation, diarrhea, heartburn, nausea and vomiting.  Genitourinary:  Negative for dysuria and urgency.  Musculoskeletal:  Positive for falls and joint pain. Negative for back pain.  Skin:  Negative for rash.  Neurological:  Negative for dizziness, speech change, focal weakness and headaches.  Endo/Heme/Allergies:  Does not bruise/bleed easily.  Psychiatric/Behavioral:  Negative for depression.   DRUG ALLERGIES:   Allergies  Allergen Reactions   Celecoxib     Other reaction(s): Other (qualifier value) Patient is allergic to Celebrex and found that it caused heart failure. CHF   Pregabalin     Other reaction(s): Localized superficial swelling of skin   VITALS:  Blood pressure (!) 129/55, pulse 80, temperature 97.9 F (36.6 C), resp. rate 16, height 5\' 6"  (1.676 m), weight 53.7 kg, SpO2 97 %. PHYSICAL EXAMINATION:  Physical Exam 75 year old female lying in the bed comfortably without any acute distress Eyes: Left orbital ecchymosis with multiple scratches on the face Skin: Left elbow ecchymosis with surgical dressing in place Cardiovascular: S1-S2 normal Lungs clear to auscultation bilaterally Abdomen: Soft, benign Neuro: Nonfocal LABORATORY PANEL:  Female CBC Recent Labs  Lab  11/19/20 0619  WBC 8.6  HGB 11.3*  HCT 34.3*  PLT 170   ------------------------------------------------------------------------------------------------------------------ Chemistries  Recent Labs  Lab 11/19/20 0619  NA 128*  K 4.3  CL 91*  CO2 33*  GLUCOSE 132*  BUN 19  CREATININE 0.62  CALCIUM 9.3   RADIOLOGY:  DG Elbow 2 Views Left  Result Date: 11/19/2020 CLINICAL DATA:  Left elbow surgery. EXAM: LEFT ELBOW - 2 VIEW; DG C-ARM 1-60 MIN COMPARISON:  11/18/2020. FINDINGS: Plate and screw fixation of the ulna noted. Hardware intact. Anatomic alignment. IMPRESSION: Plate and screw fixation of the ulna with anatomic alignment. Electronically Signed   By: Maisie Fushomas  Register   On: 11/19/2020 10:18   DG Elbow Complete Left  Result Date: 11/18/2020 CLINICAL DATA:  Fall.  Elbow pain. EXAM: LEFT ELBOW - COMPLETE 3+ VIEW COMPARISON:  None. FINDINGS: Three views study shows and olecranon fracture with approximately 17 mm of proximal distraction of the dominant proximal fracture fragment. No other fracture evident. Lateral film shows elevation of the anterior posterior fat pads consistent with joint effusion. There is extensive overlying soft tissue edema/hemorrhage. IMPRESSION: Olecranon fracture with joint effusion and soft tissue edema/hemorrhage. Electronically Signed   By: Kennith CenterEric  Mansell M.D.   On: 11/18/2020 19:11   CT Head Wo Contrast  Result Date: 11/18/2020 CLINICAL DATA:  Facial trauma fall EXAM: CT HEAD WITHOUT CONTRAST CT MAXILLOFACIAL WITHOUT CONTRAST CT CERVICAL SPINE WITHOUT CONTRAST TECHNIQUE: Multidetector CT imaging of the head, cervical spine, and maxillofacial structures were performed using the standard protocol without intravenous contrast. Multiplanar CT image reconstructions of  the cervical spine and maxillofacial structures were also generated. COMPARISON:  CT brain 04/09/2016, CT cervical spine 11/24/2016, MRI brain 03/18/2019, CT brain 03/17/2019 FINDINGS: CT HEAD FINDINGS  Brain: No acute territorial infarction, hemorrhage, or intracranial mass. The ventricles are nonenlarged. Vascular: No hyperdense vessels.  Carotid vascular calcification. Skull: Normal. Negative for fracture or focal lesion. Other: None CT MAXILLOFACIAL FINDINGS Osseous: Mandibular heads are normally position. No mandibular fracture. Mastoid air cells are clear. Pterygoid plates and zygomatic arches are intact. No acute nasal bone fracture Orbits: Negative. No traumatic or inflammatory finding. Sinuses: Clear. Soft tissues: Small left forehead scalp swelling CT CERVICAL SPINE FINDINGS Alignment: Straightening of the cervical spine. No subluxation. Facet alignment within normal limits Skull base and vertebrae: No acute fracture. No primary bone lesion or focal pathologic process. Soft tissues and spinal canal: No prevertebral fluid or swelling. No visible canal hematoma. Disc levels: Multilevel degenerative changes, most advanced at C5-C6. Upper chest: Apical emphysema Other: None IMPRESSION: 1. No CT evidence for acute intracranial abnormality. 2. No acute facial bone fracture identified 3. Straightening of the cervical spine without acute osseous abnormality 4. Emphysema Electronically Signed   By: Jasmine Pang M.D.   On: 11/18/2020 19:06   CT Cervical Spine Wo Contrast  Result Date: 11/18/2020 CLINICAL DATA:  Facial trauma fall EXAM: CT HEAD WITHOUT CONTRAST CT MAXILLOFACIAL WITHOUT CONTRAST CT CERVICAL SPINE WITHOUT CONTRAST TECHNIQUE: Multidetector CT imaging of the head, cervical spine, and maxillofacial structures were performed using the standard protocol without intravenous contrast. Multiplanar CT image reconstructions of the cervical spine and maxillofacial structures were also generated. COMPARISON:  CT brain 04/09/2016, CT cervical spine 11/24/2016, MRI brain 03/18/2019, CT brain 03/17/2019 FINDINGS: CT HEAD FINDINGS Brain: No acute territorial infarction, hemorrhage, or intracranial mass. The  ventricles are nonenlarged. Vascular: No hyperdense vessels.  Carotid vascular calcification. Skull: Normal. Negative for fracture or focal lesion. Other: None CT MAXILLOFACIAL FINDINGS Osseous: Mandibular heads are normally position. No mandibular fracture. Mastoid air cells are clear. Pterygoid plates and zygomatic arches are intact. No acute nasal bone fracture Orbits: Negative. No traumatic or inflammatory finding. Sinuses: Clear. Soft tissues: Small left forehead scalp swelling CT CERVICAL SPINE FINDINGS Alignment: Straightening of the cervical spine. No subluxation. Facet alignment within normal limits Skull base and vertebrae: No acute fracture. No primary bone lesion or focal pathologic process. Soft tissues and spinal canal: No prevertebral fluid or swelling. No visible canal hematoma. Disc levels: Multilevel degenerative changes, most advanced at C5-C6. Upper chest: Apical emphysema Other: None IMPRESSION: 1. No CT evidence for acute intracranial abnormality. 2. No acute facial bone fracture identified 3. Straightening of the cervical spine without acute osseous abnormality 4. Emphysema Electronically Signed   By: Jasmine Pang M.D.   On: 11/18/2020 19:06   Chest Portable 1 View  Result Date: 11/18/2020 CLINICAL DATA:  Fall EXAM: PORTABLE CHEST 1 VIEW COMPARISON:  CT chest 03/07/2020, radiograph 03/17/2019 FINDINGS: Hyperinflation with emphysema and mild chronic bronchitic changes. No acute airspace disease, pleural effusion, or pneumothorax. Normal cardiomediastinal silhouette with aortic atherosclerosis. IMPRESSION: No active disease.  Emphysema and bronchitic changes Electronically Signed   By: Jasmine Pang M.D.   On: 11/18/2020 20:49   DG Knee Complete 4 Views Right  Result Date: 11/18/2020 CLINICAL DATA:  Fall EXAM: RIGHT KNEE - COMPLETE 4+ VIEW COMPARISON:  None. FINDINGS: Acute nondisplaced fracture at the lower pole of the patella. Positive for knee effusion. No dislocation. Mild joint space  calcification consistent with chondrocalcinosis. IMPRESSION: Acute nondisplaced patellar  fracture with knee effusion. Electronically Signed   By: Jasmine Pang M.D.   On: 11/18/2020 19:11   DG C-Arm 1-60 Min  Result Date: 11/19/2020 CLINICAL DATA:  Left elbow surgery. EXAM: LEFT ELBOW - 2 VIEW; DG C-ARM 1-60 MIN COMPARISON:  11/18/2020. FINDINGS: Plate and screw fixation of the ulna noted. Hardware intact. Anatomic alignment. IMPRESSION: Plate and screw fixation of the ulna with anatomic alignment. Electronically Signed   By: Maisie Fus  Register   On: 11/19/2020 10:18   CT Maxillofacial Wo Contrast  Result Date: 11/18/2020 CLINICAL DATA:  Facial trauma fall EXAM: CT HEAD WITHOUT CONTRAST CT MAXILLOFACIAL WITHOUT CONTRAST CT CERVICAL SPINE WITHOUT CONTRAST TECHNIQUE: Multidetector CT imaging of the head, cervical spine, and maxillofacial structures were performed using the standard protocol without intravenous contrast. Multiplanar CT image reconstructions of the cervical spine and maxillofacial structures were also generated. COMPARISON:  CT brain 04/09/2016, CT cervical spine 11/24/2016, MRI brain 03/18/2019, CT brain 03/17/2019 FINDINGS: CT HEAD FINDINGS Brain: No acute territorial infarction, hemorrhage, or intracranial mass. The ventricles are nonenlarged. Vascular: No hyperdense vessels.  Carotid vascular calcification. Skull: Normal. Negative for fracture or focal lesion. Other: None CT MAXILLOFACIAL FINDINGS Osseous: Mandibular heads are normally position. No mandibular fracture. Mastoid air cells are clear. Pterygoid plates and zygomatic arches are intact. No acute nasal bone fracture Orbits: Negative. No traumatic or inflammatory finding. Sinuses: Clear. Soft tissues: Small left forehead scalp swelling CT CERVICAL SPINE FINDINGS Alignment: Straightening of the cervical spine. No subluxation. Facet alignment within normal limits Skull base and vertebrae: No acute fracture. No primary bone lesion or focal  pathologic process. Soft tissues and spinal canal: No prevertebral fluid or swelling. No visible canal hematoma. Disc levels: Multilevel degenerative changes, most advanced at C5-C6. Upper chest: Apical emphysema Other: None IMPRESSION: 1. No CT evidence for acute intracranial abnormality. 2. No acute facial bone fracture identified 3. Straightening of the cervical spine without acute osseous abnormality 4. Emphysema Electronically Signed   By: Jasmine Pang M.D.   On: 11/18/2020 19:06   ASSESSMENT AND PLAN:  75 y.o. female with medical history significant for anxiety, hyperlipidemia, hypothyroid, history of COPD, depression, history of tobacco use, admitted to the hospital with left olecranon and right patella fracture status post fall  Principal Problem:   Olecranon fracture Active Problems:   Bronchitis   Hypothyroidism   Hyperlipidemia, mixed   Pulmonary HTN (HCC)   Patellar fracture  #Closed displaced olecranon fracture on left elbow and closed nondisplaced patella fracture of right knee-present on admission  - Secondary to mechanical fall - Pain control as needed - S/p ORIF of displaced left olecranon fracture and application of long-leg hinged brace in the right lower extremity by Dr. Joette Catching on 7/5  # COPD-chronic bronchitis secondary to history of tobacco use - Continue home inhalers, albuterol, duo nebs   # Hypothyroid-continue levothyroxine  # Hyperlipidemia-rosuvastatin 20 mg nightly   # Left eye ecchymosis-present on admission due to fall   # Depression/anxiety-fluoxetine 40 mg daily  - Mirtazapine 7.5 mg nightly - Lorazepam 1 mg p.o. nightly as needed for anxiety and sleep resumed     Body mass index is 19.11 kg/m.  Net IO Since Admission: 975 mL [11/19/20 1405]      Status is: Inpatient  Remains inpatient appropriate because:Ongoing active pain requiring inpatient pain management.  Safe disposition  Dispo: The patient is from: Home              Anticipated  d/c is to: SNF  Patient currently is not medically stable to d/c.   Difficult to place patient No   DVT prophylaxis:       enoxaparin (LOVENOX) injection 40 mg Start: 11/20/20 0800 Place TED hose Start: 11/19/20 1208 Foot Pump / plexipulse Start: 11/19/20 1208     Family Communication: Patient's caregiver is at bedside and was updated on 7/5.  I have tried to call her daughter with no success   All the records are reviewed and case discussed with Care Management/Social Worker. Management plans discussed with the patient, caregiver and they are in agreement.  CODE STATUS: Full Code Level of care: Med-Surg  TOTAL TIME TAKING CARE OF THIS PATIENT: 45 minutes.   More than 50% of the time was spent in counseling/coordination of care: YES  POSSIBLE D/C IN 1-2 DAYS, DEPENDING ON CLINICAL CONDITION.  And therapy assessment, SNF versus home with home health placement   Delfino Lovett M.D on 11/19/2020 at 2:05 PM  [facility]   CC: Primary care physician; Lyndon Code, MD  Note: This dictation was prepared with Dragon dictation along with smaller phrase technology. Any transcriptional errors that result from this process are unintentional.

## 2020-11-19 NOTE — Op Note (Signed)
11/19/2020  11:07 AM  Patient:   [name]  Pre-Op Diagnosis:   1.  Closed displaced olecranon fracture, left elbow.  2.  Closed nondisplaced patella fracture, right knee.  Post-Op Diagnosis:   Same  Procedure:   1.  Open reduction and internal fixation of displaced left olecranon fracture.  2.  Application of long-leg hinged brace right lower extremity.  Surgeon:   Maryagnes Amos, MD  Assistant:   Jarvis Newcomer, PA-S  Anesthesia:   GET  Findings:   As above.  Complications:   None  Fluids:   1000 cc crystalloid  EBL:   25 cc  UOP:   None  TT:   85 minutes at 250 mmHg  Drains:   None  Closure:   Staples  Implants:   Short [employer] precontoured olecranon plate  Brief Clinical Note:   The patient is a 75 year old female who sustained the above-noted injuries yesterday afternoon when she tripped and fell in her home. She was brought to the emergency room where x-rays confirmed the above-noted injuries. She has been cleared medically and presents at this time for definitive management of these injuries.  Procedure:   The patient was brought into the operating room and laid in the supine position.  After adequate general endotracheal intubation and anesthesia were obtained, the patient was rolled into the left lateral decubitus position and secured using a short beanbag.  Care was taken to be sure that all bony prominences were padded and that an axillary roll was utilized.  The left upper extremity was prepped with ChloraPrep solution before being draped sterilely.  Preoperative antibiotics were administered.  A timeout was performed to verify the appropriate surgical site before the limb was exsanguinated with an Esmarch and the tourniquet inflated to 250 mmHg.    An approximately 10 to 12 cm curvilinear incision was made over the posterior aspect of the elbow, curving around the posterior aspect of the olecranon.  The incision was carried down through the subcutaneous  tissues to expose the fracture site.  The abundant fracture hematoma was debrided using pick-ups, curettes, rongeurs, and irrigation.  Once the fracture was debrided thoroughly, the fracture was reduced and temporarily secured using bone tenaculums.    After verifying the adequacy of reduction fluoroscopically in AP and lateral projections, the short [employer] precontoured olecranon plate was selected.  Minor additional contouring was performed before the plate was applied to the posterior aspect of the elbow and secured using a nonlocking bicortical screw distally and a locking "homerun screw" proximally.  The adequacy of hardware position was verified and found to be satisfactory.  Proximally, two additional locking screws were placed through the bone fragment proximally and three additional locking screws placed distally to stabilize the fracture.  Again the adequacy of fracture reduction and hardware position was verified fluoroscopically in AP and lateral projections and found to be excellent.  One of the distal locking screws was felt to have compromised the proximal radioulnar joint, so it was removed and replaced with a variable angle locking screw.  This alleviated the scraping sensation which was noted with pronation and supination.  As an added precaution, a 2 mm FiberTape was placed in a figure-of-eight configuration through the triceps tendon around the "homerun screw" approximately and through a drill hole in the proximal olecranon shaft placed lateral to medial, then tied securely.  The wound was copiously irrigated with bacitracin saline solution with bulb irrigation before the deeper fascial layers were reapproximated  using #0 Vicryl interrupted sutures.  The subcutaneous tissues were closed using 2-0 Vicryl interrupted sutures before the skin was closed with skin staples.  A total of 20 cc of 0.5% Sensorcaine with epinephrine was injected in and around the incision to help with  postoperative analgesia.  A sterile bulky dressing was applied to the wound.  The patient was then placed into a posterior splint maintaining the elbow at approximately 80 degrees of flexion.  The patient was rolled back into the supine position on his/her hospital bed.  While in the supine position, a hinged postoperative brace was applied to the right lower extremity with the hinges set at 0 degrees and locked in extension.  The patient was then awakened, extubated, and returned to the recovery room in satisfactory condition after tolerating the procedure well.

## 2020-11-19 NOTE — Consult Note (Signed)
ORTHOPAEDIC CONSULTATION  REQUESTING PHYSICIAN: Delfino Lovett, MD  Chief Complaint:   Left elbow pain and right knee pain.  History of Present Illness: [name] is a 75 y.o. female with multiple medical problems including diabetes, congestive heart failure, COPD, Graves' disease, hypertension, anxiety/depression, irritable bowel syndrome, and PTSD who lives independently.  Apparently, the patient was in her usual state of health yesterday afternoon when she turned suddenly while in her home.  Her shoe caught and she fell onto her left elbow.  She also struck her right knee and her left forehead.  The patient was brought to the emergency room where x-rays demonstrated a displaced left olecranon fracture and a nondisplaced right patella fracture.  The patient also had a superficial laceration to the left superior orbital region which was managed by the ER provider with simple cleansing.  The patient's left upper extremity was placed into a posterior splint while her leg was placed into a knee immobilizer before she was admitted for pain control and definitive management of her fractures.  The patient denies any loss of consciousness.  She also denies any lightheadedness, dizziness, chest pain, shortness of breath, or other symptoms which may have precipitated her fall.  Past Medical History:  Diagnosis Date   Anxiety    Chest pain    CHF (congestive heart failure) (HCC)    COPD (chronic obstructive pulmonary disease) (HCC)    Depression    Diabetes mellitus without complication (HCC)    Fibromyalgia    Graves disease    Hypertension    IBS (irritable bowel syndrome)    PTSD (post-traumatic stress disorder)    Sinus problem    Spinal stenosis    Past Surgical History:  Procedure Laterality Date   ABDOMINAL HYSTERECTOMY     ESOPHAGOGASTRODUODENOSCOPY (EGD) WITH PROPOFOL N/A 01/10/2015   Procedure: ESOPHAGOGASTRODUODENOSCOPY  (EGD) WITH PROPOFOL;  Surgeon: Wallace Cullens, MD;  Location: [facility] ENDOSCOPY;  Service: Gastroenterology;  Laterality: N/A;   RECTAL SURGERY     RIGHT/LEFT HEART CATH AND CORONARY ANGIOGRAPHY Bilateral 04/08/2020   Procedure: RIGHT/LEFT HEART CATH AND CORONARY ANGIOGRAPHY;  Surgeon: Iran Ouch, MD;  Location: [facility] INVASIVE CV LAB;  Service: Cardiovascular;  Laterality: Bilateral;   SAVORY DILATION N/A 01/10/2015   Procedure: SAVORY DILATION;  Surgeon: Wallace Cullens, MD;  Location: [facility] ENDOSCOPY;  Service: Gastroenterology;  Laterality: N/A;   toenail removal Bilateral    great toes   TONSILLECTOMY AND ADENOIDECTOMY     Social History   Socioeconomic History   Marital status: Widowed    Spouse name: Not on file   Number of children: 2   Years of education: Not on file   Highest education level: Not on file  Occupational History   Not on file  Tobacco Use   Smoking status: Former    Packs/day: 0.50    Years: 25.00    Pack years: 12.50    Types: Cigarettes    Quit date: 06/03/2016    Years since quitting: 4.4   Smokeless tobacco: Never  Vaping Use   Vaping Use: Never used  Substance and Sexual Activity   Alcohol use: No    Alcohol/week: 0.0 standard drinks   Drug use: No   Sexual activity: Not Currently  Other Topics Concern   Not on file  Social History Narrative   Lives by herself; 1 daughter Marcelino Duster in [place]: 1 daughter in [place]    Social Determinants of Health   Financial Resource Strain: Not on  file  Food Insecurity: No Food Insecurity   Worried About Programme researcher, broadcasting/film/video in the Last Year: Never true   Ran Out of Food in the Last Year: Never true  Transportation Needs: No Transportation Needs   Lack of Transportation (Medical): No   Lack of Transportation (Non-Medical): No  Physical Activity: Not on file  Stress: Not on file  Social Connections: Not on file   Family History  Problem Relation Age of Onset   Alcohol abuse Mother    Depression Mother     Varicose Veins Mother    Alcohol abuse Father    Depression Father    Alcohol abuse Sister    Asthma Sister    COPD Sister    Depression Sister    Heart disease Sister    Hypertension Sister    Diabetes Sister    Depression Maternal Grandmother    Diabetes Paternal Grandmother    Stroke Paternal Grandmother    Allergies  Allergen Reactions   Celecoxib     Other reaction(s): Other (qualifier value) Patient is allergic to Celebrex and found that it caused heart failure. CHF   Pregabalin     Other reaction(s): Localized superficial swelling of skin   Prior to Admission medications   Medication Sig Start Date End Date Taking? Authorizing Provider  acetaminophen (TYLENOL) 500 MG tablet Take 1,000 mg by mouth in the morning, at noon, and at bedtime.   Yes [provider]  albuterol (PROAIR HFA) 108 (90 Base) MCG/ACT inhaler INHALE 2 PUFFS INTO LUNGS EVERY 6 HOURS AS NEEDED FOR WHEEZING OR SHORTNESS OF BREATH Patient taking differently: Inhale 2 puffs into the lungs 2 (two) times daily as needed for shortness of breath. 07/05/20  Yes Lyndon Code, MD  alendronate (FOSAMAX) 70 MG tablet Take 70 mg by mouth every Tuesday.  01/11/19  Yes [provider]  aspirin EC 81 MG tablet Take 81 mg by mouth daily. Swallow whole.   Yes [provider]  Azelastine HCl 137 MCG/SPRAY SOLN Place 2 sprays into both nostrils in the morning and at bedtime.  09/06/19  Yes [provider]  Cyanocobalamin (VITAMIN B-12) 1000 MCG/15ML LIQD Take 1,200 mcg by mouth daily.   Yes [provider]  ergocalciferol (VITAMIN D2) 1.25 MG (50000 UT) capsule Take 1 capsule by mouth every 7 (seven) days. 01/16/20  Yes [provider]  FLUoxetine (PROZAC) 20 MG capsule Take 2 capsules (40 mg total) by mouth daily. 10/29/20  Yes Lyndon Code, MD  fluticasone [facility]) 50 MCG/ACT nasal spray Place 2 sprays into both nostrils in the morning and at bedtime.  03/13/19  Yes [provider]  guaifenesin (HUMIBID E) 400 MG TABS tablet Take 800 mg by mouth every 6 (six) hours as needed (congestion/cough).    Yes [provider]  hydrocortisone 2.5 % cream Place 1 application rectally 2 (two) times daily as needed (itching/hemorroids).    Yes [provider]  ipratropium-albuterol (DUONEB) 0.5-2.5 (3) MG/3ML SOLN One vial 3 x  aday for copd with hypoxia Patient taking differently: Inhale 3 mLs into the lungs in the morning and at bedtime. 02/27/20  Yes Lyndon Code, MD  levothyroxine (SYNTHROID) 88 MCG tablet Take 88 mcg by mouth daily before breakfast. Take 30 minutes before food and with a full glass of water   Yes [provider]  LORazepam (ATIVAN) 1 MG tablet Take 1 tablet (1 mg total) by mouth at bedtime. 10/29/20  Yes Welton Flakes,  Shannan HarperFozia M, MD  Multiple Vitamin (MULTIVITAMIN WITH MINERALS) TABS tablet Take 1 tablet by mouth daily. Multivitamin for Seniors   Yes [provider]  rosuvastatin (CRESTOR) 20 MG tablet Take 1 tablet (20 mg total) by mouth daily. 03/12/20  Yes Lyndon CodeKhan, Fozia M, MD  SYMBICORT 160-4.5 MCG/ACT inhaler Inhale 2 puffs into the lungs 2 (two) times daily. 02/20/19  Yes [provider]  tiotropium (SPIRIVA) 18 MCG inhalation capsule Place 18 mcg into inhaler and inhale in the morning and at bedtime.   Yes [provider]  traMADol (ULTRAM) 50 MG tablet Take one tab po tid for pain Patient taking differently: Take 50 mg by mouth 2 (two) times daily as needed. Upon awakening for body stiffness and pain. May take additional dose if needed later in day. 11/07/20  Yes Lyndon CodeKhan, Fozia M, MD  albuterol (VENTOLIN HFA) 108 (90 Base) MCG/ACT inhaler Inhale 2 puffs into the lungs every 6 (six) hours as needed for wheezing or shortness of breath. 07/05/20   Lyndon CodeKhan, Fozia M, MD  mirtazapine (REMERON) 7.5 MG tablet Take 1 tablet (7.5 mg total) by mouth at bedtime. Patient not taking: Reported on 11/19/2020 10/29/20   Lyndon CodeKhan, Fozia M,  MD   DG Elbow Complete Left  Result Date: 11/18/2020 CLINICAL DATA:  Fall.  Elbow pain. EXAM: LEFT ELBOW - COMPLETE 3+ VIEW COMPARISON:  None. FINDINGS: Three views study shows and olecranon fracture with approximately 17 mm of proximal distraction of the dominant proximal fracture fragment. No other fracture evident. Lateral film shows elevation of the anterior posterior fat pads consistent with joint effusion. There is extensive overlying soft tissue edema/hemorrhage. IMPRESSION: Olecranon fracture with joint effusion and soft tissue edema/hemorrhage. Electronically Signed   By: Kennith CenterEric  Mansell M.D.   On: 11/18/2020 19:11   CT Head Wo Contrast  Result Date: 11/18/2020 CLINICAL DATA:  Facial trauma fall EXAM: CT HEAD WITHOUT CONTRAST CT MAXILLOFACIAL WITHOUT CONTRAST CT CERVICAL SPINE WITHOUT CONTRAST TECHNIQUE: Multidetector CT imaging of the head, cervical spine, and maxillofacial structures were performed using the standard protocol without intravenous contrast. Multiplanar CT image reconstructions of the cervical spine and maxillofacial structures were also generated. COMPARISON:  CT brain 04/09/2016, CT cervical spine 11/24/2016, MRI brain 03/18/2019, CT brain 03/17/2019 FINDINGS: CT HEAD FINDINGS Brain: No acute territorial infarction, hemorrhage, or intracranial mass. The ventricles are nonenlarged. Vascular: No hyperdense vessels.  Carotid vascular calcification. Skull: Normal. Negative for fracture or focal lesion. Other: None CT MAXILLOFACIAL FINDINGS Osseous: Mandibular heads are normally position. No mandibular fracture. Mastoid air cells are clear. Pterygoid plates and zygomatic arches are intact. No acute nasal bone fracture Orbits: Negative. No traumatic or inflammatory finding. Sinuses: Clear. Soft tissues: Small left forehead scalp swelling CT CERVICAL SPINE FINDINGS Alignment: Straightening of the cervical spine. No subluxation. Facet alignment within normal limits Skull base and vertebrae: No  acute fracture. No primary bone lesion or focal pathologic process. Soft tissues and spinal canal: No prevertebral fluid or swelling. No visible canal hematoma. Disc levels: Multilevel degenerative changes, most advanced at C5-C6. Upper chest: Apical emphysema Other: None IMPRESSION: 1. No CT evidence for acute intracranial abnormality. 2. No acute facial bone fracture identified 3. Straightening of the cervical spine without acute osseous abnormality 4. Emphysema Electronically Signed   By: Jasmine PangKim  Fujinaga M.D.   On: 11/18/2020 19:06   CT Cervical Spine Wo Contrast  Result Date: 11/18/2020 CLINICAL DATA:  Facial trauma fall EXAM: CT HEAD WITHOUT CONTRAST CT MAXILLOFACIAL WITHOUT CONTRAST CT CERVICAL SPINE WITHOUT  CONTRAST TECHNIQUE: Multidetector CT imaging of the head, cervical spine, and maxillofacial structures were performed using the standard protocol without intravenous contrast. Multiplanar CT image reconstructions of the cervical spine and maxillofacial structures were also generated. COMPARISON:  CT brain 04/09/2016, CT cervical spine 11/24/2016, MRI brain 03/18/2019, CT brain 03/17/2019 FINDINGS: CT HEAD FINDINGS Brain: No acute territorial infarction, hemorrhage, or intracranial mass. The ventricles are nonenlarged. Vascular: No hyperdense vessels.  Carotid vascular calcification. Skull: Normal. Negative for fracture or focal lesion. Other: None CT MAXILLOFACIAL FINDINGS Osseous: Mandibular heads are normally position. No mandibular fracture. Mastoid air cells are clear. Pterygoid plates and zygomatic arches are intact. No acute nasal bone fracture Orbits: Negative. No traumatic or inflammatory finding. Sinuses: Clear. Soft tissues: Small left forehead scalp swelling CT CERVICAL SPINE FINDINGS Alignment: Straightening of the cervical spine. No subluxation. Facet alignment within normal limits Skull base and vertebrae: No acute fracture. No primary bone lesion or focal pathologic process. Soft tissues  and spinal canal: No prevertebral fluid or swelling. No visible canal hematoma. Disc levels: Multilevel degenerative changes, most advanced at C5-C6. Upper chest: Apical emphysema Other: None IMPRESSION: 1. No CT evidence for acute intracranial abnormality. 2. No acute facial bone fracture identified 3. Straightening of the cervical spine without acute osseous abnormality 4. Emphysema Electronically Signed   By: Jasmine Pang M.D.   On: 11/18/2020 19:06   Chest Portable 1 View  Result Date: 11/18/2020 CLINICAL DATA:  Fall EXAM: PORTABLE CHEST 1 VIEW COMPARISON:  CT chest 03/07/2020, radiograph 03/17/2019 FINDINGS: Hyperinflation with emphysema and mild chronic bronchitic changes. No acute airspace disease, pleural effusion, or pneumothorax. Normal cardiomediastinal silhouette with aortic atherosclerosis. IMPRESSION: No active disease.  Emphysema and bronchitic changes Electronically Signed   By: Jasmine Pang M.D.   On: 11/18/2020 20:49   DG Knee Complete 4 Views Right  Result Date: 11/18/2020 CLINICAL DATA:  Fall EXAM: RIGHT KNEE - COMPLETE 4+ VIEW COMPARISON:  None. FINDINGS: Acute nondisplaced fracture at the lower pole of the patella. Positive for knee effusion. No dislocation. Mild joint space calcification consistent with chondrocalcinosis. IMPRESSION: Acute nondisplaced patellar fracture with knee effusion. Electronically Signed   By: Jasmine Pang M.D.   On: 11/18/2020 19:11   CT Maxillofacial Wo Contrast  Result Date: 11/18/2020 CLINICAL DATA:  Facial trauma fall EXAM: CT HEAD WITHOUT CONTRAST CT MAXILLOFACIAL WITHOUT CONTRAST CT CERVICAL SPINE WITHOUT CONTRAST TECHNIQUE: Multidetector CT imaging of the head, cervical spine, and maxillofacial structures were performed using the standard protocol without intravenous contrast. Multiplanar CT image reconstructions of the cervical spine and maxillofacial structures were also generated. COMPARISON:  CT brain 04/09/2016, CT cervical spine 11/24/2016, MRI  brain 03/18/2019, CT brain 03/17/2019 FINDINGS: CT HEAD FINDINGS Brain: No acute territorial infarction, hemorrhage, or intracranial mass. The ventricles are nonenlarged. Vascular: No hyperdense vessels.  Carotid vascular calcification. Skull: Normal. Negative for fracture or focal lesion. Other: None CT MAXILLOFACIAL FINDINGS Osseous: Mandibular heads are normally position. No mandibular fracture. Mastoid air cells are clear. Pterygoid plates and zygomatic arches are intact. No acute nasal bone fracture Orbits: Negative. No traumatic or inflammatory finding. Sinuses: Clear. Soft tissues: Small left forehead scalp swelling CT CERVICAL SPINE FINDINGS Alignment: Straightening of the cervical spine. No subluxation. Facet alignment within normal limits Skull base and vertebrae: No acute fracture. No primary bone lesion or focal pathologic process. Soft tissues and spinal canal: No prevertebral fluid or swelling. No visible canal hematoma. Disc levels: Multilevel degenerative changes, most advanced at C5-C6. Upper chest: Apical emphysema Other:  None IMPRESSION: 1. No CT evidence for acute intracranial abnormality. 2. No acute facial bone fracture identified 3. Straightening of the cervical spine without acute osseous abnormality 4. Emphysema Electronically Signed   By: Jasmine Pang M.D.   On: 11/18/2020 19:06    Positive ROS: All other systems have been reviewed and were otherwise negative with the exception of those mentioned in the HPI and as above.  Physical Exam: General:  Alert, no acute distress Psychiatric:  Patient is competent for consent with normal mood and affect   Cardiovascular:  No pedal edema Respiratory:  No wheezing, non-labored breathing GI:  Abdomen is soft and non-tender Skin:  No lesions in the area of chief complaint Neurologic:  Sensation intact distally Lymphatic:  No axillary or cervical lymphadenopathy  Orthopedic Exam:  Orthopedic examination is limited to the left upper  extremity and right lower extremity.  Assessment of the left upper extremity demonstrates a posterior splint to be in place maintaining the elbow in approximately 80 degrees of flexion.  Skin is intact at the proximal and distal extents of the splint.  She is neurovascularly intact to her left upper extremity and hand, demonstrating the ability to actively extend and flex all digits.  She has intact sensation to light touch to all distributions.  She has excellent capillary refill to her left hand.  Assessment of the right lower extremity demonstrates moderate swelling and ecchymosis over the anterior aspect of the knee.  There is a 1+ effusion as well.  No abrasions, erythema, or other skin abnormalities are identified.  She has pain with any attempted active or passive motion of the knee.  She is neurovascularly intact to the right lower extremity and foot.  X-rays:  Recent x-rays of the left elbow are available for review and have been reviewed by myself.  These films demonstrate a 2 part displaced olecranon fracture.  No significant degenerative changes of the elbow joint are noted.  No lytic lesions or other acute bony abnormalities are identified.  Recent x-rays of the right knee also are available for review and have been reviewed by myself.  These films demonstrate a completely nondisplaced transverse linear fracture through the lower portion of the patella barely affecting the inferior most portion of the patellar articular surface.  No significant degenerative changes of the patella are noted.  No lytic lesions or other acute bony abnormalities are identified.  Assessment: 1.  Closed displaced olecranon fracture, left elbow. 2.  Closed nondisplaced transverse patellar fracture, right knee.  Plan: The treatment options for these injuries have been discussed in detail with the patient.  Regarding her right patella fracture, I feel that this can be managed nonoperatively with a knee immobilizer.   She may weight-bear as tolerated on the right leg, so long as it is in a hinged knee brace locked in extension.  Regarding her left olecranon fracture, surgical and nonsurgical options have been discussed.  The patient would like to proceed with surgical intervention to include an open reduction and internal fixation of the displaced left olecranon fracture.  The risks (including bleeding, infection, nerve and/or blood vessel injury, persistent or recurrent pain, malunion and/or nonunion, stiffness of the elbow joint, development of arthritis, need for further surgery, blood clots, strokes, heart attacks or arrhythmias, pneumonia, etc.) and benefits of the surgical procedure were discussed. The patient states her understanding and agrees to proceed. A formal written consent will be obtained by the nursing staff.  Thank you for asking me to  participate in the care of this most delightful woman.  I will be happy to follow her with you.   Maryagnes Amos, MD  Beeper #:  [phone]  11/19/2020 7:53 AM

## 2020-11-19 NOTE — ED Notes (Signed)
Pt c/o of pain that returned to elbow.

## 2020-11-19 NOTE — Transfer of Care (Signed)
Immediate Anesthesia Transfer of Care Note  Patient: [name]  Procedure(s) Performed: OPEN REDUCTION INTERNAL FIXATION (ORIF) ELBOW/OLECRANON FRACTURE (Left: Elbow)  Patient Location: PACU  Anesthesia Type:General  Level of Consciousness: awake and alert   Airway & Oxygen Therapy: Patient Spontanous Breathing and Patient connected to face mask oxygen  Post-op Assessment: Report given to RN and Post -op Vital signs reviewed and stable  Post vital signs: Reviewed and stable  Last Vitals:  Vitals Value Taken Time  BP 151/73 11/19/20 1104  Temp    Pulse    Resp 18 11/19/20 1111  SpO2    Vitals shown include unvalidated device data.  Last Pain:  Vitals:   11/19/20 0744  TempSrc: Oral  PainSc: 7          Complications: No notable events documented.

## 2020-11-20 LAB — BASIC METABOLIC PANEL
Anion gap: 4 — ABNORMAL LOW (ref 5–15)
BUN: 13 mg/dL (ref 8–23)
CO2: 33 mmol/L — ABNORMAL HIGH (ref 22–32)
Calcium: 8.5 mg/dL — ABNORMAL LOW (ref 8.9–10.3)
Chloride: 95 mmol/L — ABNORMAL LOW (ref 98–111)
Creatinine, Ser: 0.6 mg/dL (ref 0.44–1.00)
GFR, Estimated: >60 mL/min (ref >60)
Glucose, Bld: 108 mg/dL — ABNORMAL HIGH (ref 70–99)
Potassium: 3.9 mmol/L (ref 3.5–5.1)
Sodium: 132 mmol/L — ABNORMAL LOW (ref 135–145)

## 2020-11-20 LAB — CBC
HCT: 31.9 % — ABNORMAL LOW (ref 36.0–46.0)
Hemoglobin: 10.4 g/dL — ABNORMAL LOW (ref 12.0–15.0)
MCH: 32.4 pg (ref 26.0–34.0)
MCHC: 32.6 g/dL (ref 30.0–36.0)
MCV: 99.4 fL (ref 80.0–100.0)
Platelets: 151 10*3/uL (ref 150–400)
RBC: 3.21 MIL/uL — ABNORMAL LOW (ref 3.87–5.11)
RDW: 11.8 % (ref 11.5–15.5)
WBC: 7.8 10*3/uL (ref 4.0–10.5)
nRBC: 0 % (ref 0.0–0.2)

## 2020-11-20 LAB — RESP PANEL BY RT-PCR (FLU A&B, COVID) ARPGX2
Influenza A by PCR: NEGATIVE
Influenza B by PCR: NEGATIVE
SARS Coronavirus 2 by RT PCR: NEGATIVE

## 2020-11-20 MED ORDER — IPRATROPIUM-ALBUTEROL 0.5-2.5 (3) MG/3ML IN SOLN
3.0000 mL | Freq: Four times a day (QID) | RESPIRATORY_TRACT | Status: DC
  Administered 2020-11-20 – 2020-11-21 (×3): 3 mL via RESPIRATORY_TRACT
  Filled 2020-11-20 (×4): qty 3

## 2020-11-20 MED ORDER — FLUTICASONE PROPIONATE 50 MCG/ACT NA SUSP
2.0000 | Freq: Two times a day (BID) | NASAL | Status: DC
  Administered 2020-11-20 – 2020-11-21 (×3): 2 via NASAL
  Filled 2020-11-20: qty 16

## 2020-11-20 MED ORDER — MOMETASONE FURO-FORMOTEROL FUM 200-5 MCG/ACT IN AERO
2.0000 | INHALATION_SPRAY | Freq: Two times a day (BID) | RESPIRATORY_TRACT | Status: DC
  Administered 2020-11-20 – 2020-11-21 (×3): 2 via RESPIRATORY_TRACT
  Filled 2020-11-20: qty 8.8

## 2020-11-20 MED ORDER — GUAIFENESIN ER 600 MG PO TB12
600.0000 mg | ORAL_TABLET | Freq: Two times a day (BID) | ORAL | Status: DC | PRN
  Administered 2020-11-20 – 2020-11-21 (×2): 600 mg via ORAL
  Filled 2020-11-20 (×2): qty 1

## 2020-11-20 NOTE — TOC Progression Note (Signed)
Transition of Care [facility]) - Progression Note    Patient Details  Name: [name] MRN: [id number] Date of Birth: Mar 11, 1946  [facility] CM/SW Contact  Barrie Dunker, RN Phone Number: 11/20/2020, 12:00 PM  Clinical Narrative:     Reviewed the bed offer with the patient she chose [facility], will start ins auth once PT notes are in       Expected Discharge Plan and Services                                                 Social Determinants of Health (SDOH) Interventions    Readmission Risk Interventions No flowsheet data found.

## 2020-11-20 NOTE — Progress Notes (Signed)
Subjective: 1 Day Post-Op Procedure(s) (LRB): OPEN REDUCTION INTERNAL FIXATION (ORIF) ELBOW/OLECRANON FRACTURE (Left) Application of long-leg hinged brace right lower extremity. Patient reports pain as 6 on 0-10 scale.   Patient is well, and has had no acute complaints or problems Plan is to go Skilled nursing facility after hospital stay. Negative for chest pain and shortness of breath Fever: no Gastrointestinal:Negative for nausea and vomiting  Objective: Vital signs in last 24 hours: Temp:  [96.8 F (36 C)-99.1 F (37.3 C)] 98.4 F (36.9 C) (07/06 0528) Pulse Rate:  [39-93] 85 (07/06 0528) Resp:  [15-18] 16 (07/06 0528) BP: (110-156)/(55-96) 139/71 (07/06 0528) SpO2:  [95 %-100 %] 100 % (07/06 0528)  Intake/Output from previous day:  Intake/Output Summary (Last 24 hours) at 11/20/2020 0728 Last data filed at 11/20/2020 0700 Gross per 24 hour  Intake 2779.66 ml  Output 1475 ml  Net 1304.66 ml    Intake/Output this shift: No intake/output data recorded.  Labs: Recent Labs    11/18/20 2011 11/19/20 0619 11/20/20 0417  HGB 11.9* 11.3* 10.4*   Recent Labs    11/19/20 0619 11/20/20 0417  WBC 8.6 7.8  RBC 3.46* 3.21*  HCT 34.3* 31.9*  PLT 170 151   Recent Labs    11/19/20 0619 11/20/20 0417  NA 128* 132*  K 4.3 3.9  CL 91* 95*  CO2 33* 33*  BUN 19 13  CREATININE 0.62 0.60  GLUCOSE 132* 108*  CALCIUM 9.3 8.5*   Recent Labs    11/18/20 2011 11/19/20 0619  INR 0.9 0.9     EXAM General - Patient is Alert, Appropriate, and Oriented Extremity - Left arm in posterior splint.  Intact to light touch to the dorsal and volar aspect of her fingers. Moderate swelling noted.  Cap refill intact. Right knee ROM brace intact, locked in extension. Dorsiflexion and plantarflexion to the right leg intact. Motor Function - intact, moving foot and toes well on exam.  Abdomen soft with normal bowel sounds. Mild wheeze noted to bilateral upper lung fields.  Past  Medical History:  Diagnosis Date   Anxiety    Chest pain    CHF (congestive heart failure) (HCC)    COPD (chronic obstructive pulmonary disease) (HCC)    Depression    Diabetes mellitus without complication (HCC)    Fibromyalgia    Graves disease    Hypertension    IBS (irritable bowel syndrome)    PTSD (post-traumatic stress disorder)    Sinus problem    Spinal stenosis     Assessment/Plan: 1 Day Post-Op Procedure(s) (LRB): OPEN REDUCTION INTERNAL FIXATION (ORIF) ELBOW/OLECRANON FRACTURE (Left) Application of long-leg hinged brace right lower extremity. Principal Problem:   Olecranon fracture Active Problems:   Bronchitis   Hypothyroidism   Hyperlipidemia, mixed   Pulmonary HTN (HCC)   Patellar fracture  Estimated body mass index is 19.11 kg/m as calculated from the following:   Height as of this encounter: 5\' 6"  (1.676 m).   Weight as of this encounter: 53.7 kg. Advance diet Up with therapy D/C IV fluids when tolerating po intake.  Labs reviewed this AM, Hg 10.4 this AM. Splint intact to the left arm, moving fingers without pain. Knee ROM locked in extension. Begin working on BM. Continue working with PT/OT. With restrictions placed on both the left arm and right leg, will need SNF upon discharge.  DVT Prophylaxis - Lovenox and Foot Pumps Non-weightbearing to the left arm. Weightbearing as tolerated with the right knee brace locked  in extension.  Valeria Batman, PA-C [facility] Orthopaedic Surgery 11/20/2020, 7:28 AM

## 2020-11-20 NOTE — [place] FL2 (Signed)
[place] MEDICAID FL2 LEVEL OF CARE SCREENING TOOL     IDENTIFICATION  Patient Name: [name] Birthdate: 07-08-1945 Sex: female Admission Date (Current Location): 11/18/2020  [facility] and [place] Number:  Chiropodist and Address:  [facility], [address] [place], [place] [address]      Provider Number: [id number]  Attending Physician Name and Address:  Delfino Lovett, MD  Relative Name and Phone Number:  Delorise Royals Daughter [phone]    Current Level of Care: Hospital Recommended Level of Care: Skilled Nursing Facility Prior Approval Number:    Date Approved/Denied:   PASRR Number: [id number]  Discharge Plan: SNF    Current Diagnoses: Patient Active Problem List   Diagnosis Date Noted   Patellar fracture 11/18/2020   Olecranon fracture 11/18/2020   Major depressive disorder, recurrent, in remission, unspecified (HCC) 04/23/2020   Pulmonary HTN (HCC)    Hypothyroidism, acquired 04/25/2019   Acute metabolic encephalopathy 03/19/2019   Confusion 03/17/2019   AMS (altered mental status) 03/12/2019   Acute encephalopathy 03/11/2019   Hypothyroidism 03/11/2019   Altered mental status, unspecified 03/11/2019   Medicare annual wellness visit, subsequent 06/13/2018   PSVT (paroxysmal supraventricular tachycardia) (HCC) 10/05/2017   Age-related osteoporosis without current pathological fracture 09/15/2017   Chronic obstructive pulmonary disease (HCC) 05/21/2017   Shortness of breath 04/05/2017   Bilateral carotid artery stenosis 03/17/2017   Hyperlipidemia, mixed 03/17/2017   Orthostatic hypotension 01/12/2017   Bronchitis 01/04/2017   Angina pectoris (HCC) 06/13/2015   UTI (lower urinary tract infection) 06/13/2015   Degenerative disc disease, lumbar 10/18/2014   Sacroiliac joint dysfunction 10/18/2014   Facet syndrome, lumbar 10/18/2014   Cervical facet joint syndrome 10/18/2014   Bilateral occipital neuralgia  10/18/2014   Migraine 10/18/2014   Neurosis, posttraumatic 10/08/2014   Posttraumatic stress disorder 10/08/2014   Spinal stenosis    PTSD (post-traumatic stress disorder)     Orientation RESPIRATION BLADDER Height & Weight     Self, Time, Situation, Place  Normal External catheter Weight: 53.7 kg Height:  5\' 6"  (167.6 cm)  BEHAVIORAL SYMPTOMS/MOOD NEUROLOGICAL BOWEL NUTRITION STATUS      Continent Diet (Heart healthy diet)  AMBULATORY STATUS COMMUNICATION OF NEEDS Skin   Extensive Assist Verbally Surgical wounds                       Personal Care Assistance Level of Assistance  Bathing, Dressing, Feeding Bathing Assistance: Limited assistance Feeding assistance: Independent Dressing Assistance: Limited assistance     Functional Limitations Info  Sight Sight Info: Impaired (wears glasses)        SPECIAL CARE FACTORS FREQUENCY  PT (By licensed PT), OT (By licensed OT)     PT Frequency: 5 times per week OT Frequency: 3 times per week            Contractures Contractures Info: Not present    Additional Factors Info  Code Status, Allergies Code Status Info: full code Allergies Info: Celecoxib, Pregabalin           Current Medications (11/20/2020):  This is the current hospital active medication list Current Facility-Administered Medications  Medication Dose Route Frequency Provider Last Rate Last Admin   albuterol (PROVENTIL) (2.5 MG/3ML) 0.083% nebulizer solution 2.5 mg  2.5 mg Inhalation Q4H PRN Poggi, 01/21/2021, MD   2.5 mg at 11/20/20 0648   bisacodyl (DULCOLAX) suppository 10 mg  10 mg Rectal Daily PRN Poggi, 01/21/21, MD  diphenhydrAMINE (BENADRYL) 12.5 MG/5ML elixir 12.5-25 mg  12.5-25 mg Oral Q4H PRN Poggi, Excell Seltzer, MD       docusate sodium (COLACE) capsule 100 mg  100 mg Oral BID Poggi, Excell Seltzer, MD   100 mg at 11/20/20 0850   enoxaparin (LOVENOX) injection 40 mg  40 mg Subcutaneous Q24H Poggi, Excell Seltzer, MD   40 mg at 11/20/20 0854   FLUoxetine  (PROZAC) capsule 40 mg  40 mg Oral Daily Poggi, Excell Seltzer, MD   40 mg at 11/20/20 0851   fluticasone (FLONASE) 50 MCG/ACT nasal spray 2 spray  2 spray Each Nare BID Delfino Lovett, MD   2 spray at 11/20/20 0854   guaiFENesin tablet 800 mg  800 mg Oral Q6H PRN Doroteo Glassman, RPH       HYDROcodone-acetaminophen (NORCO) 7.5-325 MG per tablet 1-2 tablet  1-2 tablet Oral Q4H PRN Poggi, Excell Seltzer, MD   2 tablet at 11/20/20 0912   HYDROcodone-acetaminophen (NORCO/VICODIN) 5-325 MG per tablet 1-2 tablet  1-2 tablet Oral Q4H PRN Poggi, Excell Seltzer, MD   2 tablet at 11/19/20 1254   ipratropium-albuterol (DUONEB) 0.5-2.5 (3) MG/3ML nebulizer solution 3 mL  3 mL Nebulization Q6H Delfino Lovett, MD       levothyroxine (SYNTHROID) tablet 88 mcg  88 mcg Oral Q0600 Christena Flake, MD   88 mcg at 11/20/20 0517   LORazepam (ATIVAN) tablet 1 mg  1 mg Oral QHS PRN Poggi, Excell Seltzer, MD   1 mg at 11/19/20 2124   magnesium hydroxide (MILK OF MAGNESIA) suspension 30 mL  30 mL Oral Daily PRN Poggi, Excell Seltzer, MD       metoCLOPramide (REGLAN) tablet 5-10 mg  5-10 mg Oral Q8H PRN Poggi, Excell Seltzer, MD       Or   metoCLOPramide (REGLAN) injection 5-10 mg  5-10 mg Intravenous Q8H PRN Poggi, Excell Seltzer, MD       mirtazapine (REMERON) tablet 7.5 mg  7.5 mg Oral QHS Poggi, Excell Seltzer, MD   7.5 mg at 11/19/20 2125   mometasone-formoterol (DULERA) 200-5 MCG/ACT inhaler 2 puff  2 puff Inhalation BID Delfino Lovett, MD   2 puff at 11/20/20 0852   morphine 2 MG/ML injection 0.5-1 mg  0.5-1 mg Intravenous Q2H PRN Poggi, Excell Seltzer, MD   1 mg at 11/19/20 1631   morphine 2 MG/ML injection 0.5-1 mg  0.5-1 mg Intravenous Q2H PRN Poggi, Excell Seltzer, MD   1 mg at 11/20/20 2703   multivitamin with minerals tablet 1 tablet  1 tablet Oral Daily Poggi, Excell Seltzer, MD   1 tablet at 11/20/20 0851   ondansetron (ZOFRAN) tablet 4 mg  4 mg Oral Q6H PRN Poggi, Excell Seltzer, MD       Or   ondansetron (ZOFRAN) injection 4 mg  4 mg Intravenous Q6H PRN Poggi, Excell Seltzer, MD       rosuvastatin (CRESTOR) tablet 20  mg  20 mg Oral QHS Poggi, Excell Seltzer, MD   20 mg at 11/19/20 2124   sodium phosphate (FLEET) 7-19 GM/118ML enema 1 enema  1 enema Rectal Once PRN Poggi, Excell Seltzer, MD       traMADol Janean Sark) tablet 50 mg  50 mg Oral Q6H PRN Poggi, Excell Seltzer, MD   50 mg at 11/20/20 0125   vitamin B-12 (CYANOCOBALAMIN) tablet 1,000 mcg  1,000 mcg Oral Daily Doroteo Glassman, RPH   1,000 mcg at 11/20/20 5009     Discharge Medications: Please  see discharge summary for a list of discharge medications.  Relevant Imaging Results:  Relevant Lab Results:   Additional Information SS# [id number]  Barrie Dunker, RN

## 2020-11-20 NOTE — TOC Progression Note (Signed)
Transition of Care [facility]) - Progression Note    Patient Details  Name: [name] MRN: [id number] Date of Birth: 06/11/1945  Transition of Care [facility]) CM/SW [place], RN Phone Number: 11/20/2020, 8:58 AM  Clinical Narrative:     Met with the patient and discussed DC plan and needs She lives alone in a second floor apartment and uses a rolator at baseline, she would be agreeable and feels it is in er best interest to go to rehab, she would like ot go to [facility] if they have a bed, she has been fully vaccinated and boosted and will have someone bring her purse with the card once she discharges, PASSR obtained FL2 completed and bedsearch done        Expected Discharge Plan and Services                                                 Social Determinants of Health (SDOH) Interventions    Readmission Risk Interventions No flowsheet data found.

## 2020-11-20 NOTE — Progress Notes (Signed)
Occupational Therapy Treatment Patient Details Name: [name] MRN: [id number] DOB: Oct 25, 1945 Today's Date: 11/20/2020    History of present illness [name] is a 75 y.o. female who is now s/p ORIF of displaced L olecranon fx, and closed nondisplaced patella fracture that she sustained after a fall at home (pt to wear hinged knee brace locked in extension on RLE). Pt PMH includes diabetes, congestive heart failure, COPD, Graves' disease, hypertension, anxiety/depression, irritable bowel syndrome, and PTSD.   OT comments  [name] was seen for OT treatment on this date. Upon arrival to room pt seated upright in room recliner, expressing significant satisfaction that she was able to get up and walk to the chair with physical therapy this date. Pt endorses some fatigue from the transfer though, and states that her breathing was more difficult this AM. Pt educated on energy conservation strategies, safe use of AE/DME for ADL management, and compensatory strategies for one handed LB dressing this date. Pt return verbalizes understanding of education provided. She continues to be limited by decreased functional use of her LUE/RLE. She requires MOD-MAX A for LB dressing from STS and MAX A for UB dressing. Pt making good progress toward goals and continues to benefit from skilled OT services to maximize return to PLOF and minimize risk of future falls, injury, caregiver burden, and readmission. Will continue to follow POC. Discharge recommendation remains appropriate.    Follow Up Recommendations  SNF;Supervision/Assistance - 24 hour    Equipment Recommendations  3 in 1 bedside commode    Recommendations for Other Services      Precautions / Restrictions Precautions Precautions: Fall Restrictions Weight Bearing Restrictions: Yes LUE Weight Bearing: Non weight bearing RLE Weight Bearing: Weight bearing as tolerated Other Position/Activity Restrictions: Knee immobilizer on at all times,  locked in extension.       Mobility Bed Mobility Overal bed mobility: Needs Assistance             General bed mobility comments: Deferred, pt in recliner at start/end of session.    Transfers                      Balance Overall balance assessment: Needs assistance Sitting-balance support: Single extremity supported;Feet supported Sitting balance-Leahy Scale: Fair Sitting balance - Comments: Pt limited 2/2 LUE/RLE decreased functional use.                                   ADL either performed or assessed with clinical judgement   ADL Overall ADL's : Needs assistance/impaired     Grooming: Sitting;Supervision/safety;Set up           Upper Body Dressing : Moderate assistance;Maximal assistance;Sitting   Lower Body Dressing: Moderate assistance;Maximal assistance;Sit to/from stand Lower Body Dressing Details (indicate cue type and reason): Pt educated on safe use of AE/DME for ADL management including using LHR to complete LB dressing tasks. Pt eager to trial, however endorses significant fatigue from recent PT session. Handout provided to support recall and carryover. Will plan to trial at next OT session. Toilet Transfer: BSC;Stand-pivot;Minimal assistance;Moderate assistance Toilet Transfer Details (indicate cue type and reason): Hemi-walker, BSC.         Functional mobility during ADLs: Minimal assistance;Moderate assistance (Hemi-walker)       Vision Baseline Vision/History: Wears glasses Wears Glasses: At all times Patient Visual Report: No change from baseline     Perception  Praxis      Cognition Arousal/Alertness: Awake/alert Behavior During Therapy: WFL for tasks assessed/performed Overall Cognitive Status: Within Functional Limits for tasks assessed                                 General Comments: Pt pleasant, conversational, eager to participate in therapy session.        Exercises Other  Exercises Other Exercises: Pt educated on energy conservation strategies, safe use of AE/DME for ADL management, and compensatory strategies for LB dressing. Handout provided to support recall/carryover of education provided.   Shoulder Instructions       General Comments      Pertinent Vitals/ Pain       Faces Pain Scale: Hurts little more Pain Location: Increased discomfort with movement of LUE/RLE Pain Descriptors / Indicators: Aching;Sore Pain Intervention(s): Limited activity within patient's tolerance;Monitored during session;Repositioned;Utilized relaxation techniques;Premedicated before session  Home Living                                          Prior Functioning/Environment              Frequency  Min 2X/week        Progress Toward Goals  OT Goals(current goals can now be found in the care plan section)  Progress towards OT goals: Progressing toward goals  Acute Rehab OT Goals Patient Stated Goal: To get back to being more independent. OT Goal Formulation: With patient Time For Goal Achievement: 12/03/20 Potential to Achieve Goals: Good  Plan Discharge plan remains appropriate;Frequency remains appropriate    Co-evaluation                 AM-PAC OT "6 Clicks" Daily Activity     Outcome Measure   Help from another person eating meals?: A Little Help from another person taking care of personal grooming?: A Little Help from another person toileting, which includes using toliet, bedpan, or urinal?: A Lot Help from another person bathing (including washing, rinsing, drying)?: A Lot Help from another person to put on and taking off regular upper body clothing?: A Lot Help from another person to put on and taking off regular lower body clothing?: A Lot 6 Click Score: 14    End of Session Equipment Utilized During Treatment: Gait belt;Rolling walker  OT Visit Diagnosis: Other abnormalities of gait and mobility (R26.89);History of  falling (Z91.81);Pain Pain - Right/Left: Left Pain - part of body: Arm;Shoulder   Activity Tolerance Patient tolerated treatment well   Patient Left with call bell/phone within reach;with SCD's reapplied;in chair;with chair alarm set   Nurse Communication          Time: 2025-4270 OT Time Calculation (min): 21 min  Charges: OT General Charges $OT Visit: 1 Visit OT Treatments $Self Care/Home Management : 8-22 mins  Rockney Ghee, M.S., OTR/L Ascom: [phone] 11/20/20, 12:23 PM

## 2020-11-20 NOTE — Evaluation (Signed)
Physical Therapy Evaluation Patient Details Name: [name] MRN: [id number] DOB: 10-02-1945 Today's Date: 11/20/2020   History of Present Illness  Pt is a 75 y.o. F s/p ORIF L elbow on 11/19/20 with R nondisplaced patella fracture after sustaining a fall. Pt is WBAT on RLE, NWB LUE.  PMH includes CHF, COPD, Graves disease, HTN, Anxiety, DM, and Fibromyalgia.  Clinical Impression  Pt OA x 4, motivated for PT. PLOF includes modified independence with household ambulation w/ rollator, assistance for IADLs including community mobility, cooking, intermittent bathing. PCA provides assistance 4 hrs/day at home. Pt utilizes 2L O2 at baseline, maintained throughout activity. Pt requires min-assist for bed mobility. A hemi-walker utilizing RUE and min-assist for transfers and ambulation. Assistance provided to moving hemi-walker with all steps. Decreased endurance and SOB noted with all activity, SpO2 > 90% throughout treatment on 2L O2 [place]. Pt is a fall risk due to RLE WBAT, LUE NWB status along with decreased endurance. Skilled PT intervention is indicated to address deficits in function, mobility, and to return to PLOF as able.  SNF is recommended due to a large change in PLOF.     Follow Up Recommendations SNF    Equipment Recommendations   (TBD next venue of care)    Recommendations for Other Services OT consult     Precautions / Restrictions Precautions Precautions: Fall Required Braces or Orthoses: Knee Immobilizer - Right Restrictions Weight Bearing Restrictions: Yes LUE Weight Bearing: Non weight bearing RLE Weight Bearing: Weight bearing as tolerated Other Position/Activity Restrictions: Knee immobilizer on at all times, locked in extension.      Mobility  Bed Mobility Overal bed mobility: Needs Assistance Bed Mobility: Supine to Sit     Supine to sit: Min assist;HOB elevated     General bed mobility comments: Deferred, pt in recliner at start/end of session.     Transfers Overall transfer level: Needs assistance Equipment used: Hemi-walker Transfers: Sit to/from Stand Sit to Stand: Min assist         General transfer comment: Assistance with follow-through during sit > stand; decreased endurance w/ SOB on 2L O2  Ambulation/Gait Ambulation/Gait assistance: Min assist Gait Distance (Feet): 2 Feet Assistive device: Hemi-walker Gait Pattern/deviations: Step-to pattern;Decreased weight shift to left     General Gait Details: Requires assistance to move hemi-walker, able to side, forward, backward step  Stairs            Wheelchair Mobility    Modified Rankin (Stroke Patients Only)       Balance Overall balance assessment: Needs assistance Sitting-balance support: Single extremity supported;Feet supported Sitting balance-Leahy Scale: Fair Sitting balance - Comments: Limited in dyanmic movements due to LUE immobilization   Standing balance support: Single extremity supported Standing balance-Leahy Scale: Poor Standing balance comment: Utilizes hemi-walker w/ RUE, min-gaurd to min-assist for support and safety                             Pertinent Vitals/Pain Pain Assessment: 0-10 Pain Score: 8  Faces Pain Scale: Hurts little more Pain Location: L elbow, minimal pain R patella per pt Pain Descriptors / Indicators: Throbbing;Discomfort Pain Intervention(s): Limited activity within patient's tolerance;Monitored during session;Premedicated before session;Repositioned    Home Living Family/patient expects to be discharged to:: Private residence Living Arrangements: Alone Available Help at Discharge: Personal care attendant;Available PRN/intermittently Type of Home: Apartment Home Access: Stairs to enter;Elevator Entrance Stairs-Rails: Can reach both;Left;Right Entrance Stairs-Number of Steps: ~ 12, pt  on second floor. Home Layout: One level Home Equipment: Emergency planning/management officer - 4 wheels      Prior Function  Level of Independence: Needs assistance   Gait / Transfers Assistance Needed: Pt utilizes rollator for household mobility, community distances requires assistance  ADL's / Homemaking Assistance Needed: Utilizes some assistance for cleaning, cooking, intermittent bathing        Hand Dominance   Dominant Hand: Right    Extremity/Trunk Assessment   Upper Extremity Assessment RUE Deficits / Details: MMT: 4/5 shoulder abd, 5/5 elbow flex, 4/5 elbow ext LUE Deficits / Details: s/p ORIF L olecranon fx w/ NWB orders; splint utilized LUE: Unable to fully assess due to immobilization    Lower Extremity Assessment Lower Extremity Assessment: RLE deficits/detail;LLE deficits/detail RLE Deficits / Details: At least 3/5 AROM w/ hip flex, dorsiflexion; RLE locked in extension w/ hinged brace RLE: Unable to fully assess due to immobilization RLE Sensation:  (SILT phalanges) RLE Coordination: decreased gross motor LLE Deficits / Details: At least 3/5 AROM w/ hip flex, knee ext, dorsiflexion    Cervical / Trunk Assessment Cervical / Trunk Assessment: Kyphotic  Communication   Communication: No difficulties  Cognition Arousal/Alertness: Awake/alert Behavior During Therapy: WFL for tasks assessed/performed Overall Cognitive Status: Within Functional Limits for tasks assessed                                 General Comments: Pt pleasant, conversational, eager to participate in therapy session.      General Comments General comments (skin integrity, edema, etc.): Pt on 2L O2 [place] throughout treatment, SpO2 > 90% with all activity    Exercises Other Exercises Other Exercises: Pt educated on energy conservation strategies, safe use of AE/DME for ADL management, and compensatory strategies for LB dressing. Handout provided to support recall/carryover of education provided.   Assessment/Plan    PT Assessment Patient needs continued PT services  PT Problem List Decreased  strength;Decreased range of motion;Decreased activity tolerance;Decreased balance;Decreased mobility;Decreased coordination       PT Treatment Interventions DME instruction;Gait training;Neuromuscular re-education;Stair training;Functional mobility training;Therapeutic activities;Therapeutic exercise    PT Goals (Current goals can be found in the Care Plan section)  Acute Rehab PT Goals Patient Stated Goal: To become more independent PT Goal Formulation: With patient Time For Goal Achievement: 12/04/20 Potential to Achieve Goals: Good    Frequency 7X/week   Barriers to discharge        Co-evaluation               AM-PAC PT "6 Clicks" Mobility  Outcome Measure Help needed turning from your back to your side while in a flat bed without using bedrails?: A Little Help needed moving from lying on your back to sitting on the side of a flat bed without using bedrails?: A Lot Help needed moving to and from a bed to a chair (including a wheelchair)?: A Lot Help needed standing up from a chair using your arms (e.g., wheelchair or bedside chair)?: A Lot Help needed to walk in hospital room?: A Lot Help needed climbing 3-5 steps with a railing? : Total 6 Click Score: 12    End of Session Equipment Utilized During Treatment: Gait belt Activity Tolerance: Patient tolerated treatment well Patient left: in chair;with call bell/phone within reach;with chair alarm set;with SCD's reapplied Nurse Communication: Mobility status PT Visit Diagnosis: Other abnormalities of gait and mobility (R26.89);Muscle weakness (generalized) (M62.81);Difficulty in walking, not  elsewhere classified (R26.2)    Time: 5521-7471 PT Time Calculation (min) (ACUTE ONLY): 49 min   Charges:             [employer], SPT

## 2020-11-20 NOTE — Progress Notes (Signed)
1       [place] at [facility]   PATIENT NAME: [name]    MR#:  [id number]  Lyndon Code, MD  DATE OF BIRTH:  24-Dec-1945  SUBJECTIVE:  CHIEF COMPLAINT:   Chief Complaint  Patient presents with   Fall  Wheezing some this morning.  Requesting to resume her inhalers REVIEW OF SYSTEMS:  Review of Systems  Constitutional:  Negative for chills, fever and weight loss.  HENT:  Negative for nosebleeds and sore throat.   Eyes:  Negative for blurred vision.  Respiratory:  Positive for wheezing. Negative for cough and shortness of breath.   Cardiovascular:  Negative for chest pain, orthopnea, leg swelling and PND.  Gastrointestinal:  Negative for abdominal pain, constipation, diarrhea, heartburn, nausea and vomiting.  Genitourinary:  Negative for dysuria and urgency.  Musculoskeletal:  Positive for falls and joint pain. Negative for back pain.  Skin:  Negative for rash.  Neurological:  Negative for dizziness, speech change, focal weakness and headaches.  Endo/Heme/Allergies:  Does not bruise/bleed easily.  Psychiatric/Behavioral:  Negative for depression.   DRUG ALLERGIES:   Allergies  Allergen Reactions   Celecoxib     Other reaction(s): Other (qualifier value) Patient is allergic to Celebrex and found that it caused heart failure. CHF   Pregabalin     Other reaction(s): Localized superficial swelling of skin   VITALS:  Blood pressure 140/73, pulse 82, temperature 99.1 F (37.3 C), resp. rate 17, height 5\' 6"  (1.676 m), weight 53.7 kg, SpO2 97 %. PHYSICAL EXAMINATION:  Physical Exam 75 year old female lying in the bed comfortably without any acute distress Eyes: Left orbital ecchymosis with multiple scratches on the face Extremities: Left arm in posterior splint.  Moderate swelling.  Right knee brace intact.  Locked in extension. Cardiovascular: S1-S2 normal Lungs bilateral expiratory wheezing Abdomen: Soft, benign Neuro: Nonfocal LABORATORY PANEL:   Female CBC Recent Labs  Lab 11/20/20 0417  WBC 7.8  HGB 10.4*  HCT 31.9*  PLT 151    ------------------------------------------------------------------------------------------------------------------ Chemistries  Recent Labs  Lab 11/20/20 0417  NA 132*  K 3.9  CL 95*  CO2 33*  GLUCOSE 108*  BUN 13  CREATININE 0.60  CALCIUM 8.5*    RADIOLOGY:  No results found. ASSESSMENT AND PLAN:  75 y.o. female with medical history significant for anxiety, hyperlipidemia, hypothyroid, history of COPD, depression, history of tobacco use, admitted to the hospital with left olecranon and right patella fracture status post fall  Principal Problem:   Olecranon fracture Active Problems:   Bronchitis   Hypothyroidism   Hyperlipidemia, mixed   Pulmonary HTN (HCC)   Patellar fracture  #Closed displaced olecranon fracture on left elbow and closed nondisplaced patella fracture of right knee-present on admission  - Secondary to mechanical fall - Pain control as needed - S/p ORIF of displaced left olecranon fracture and application of long-leg hinged brace in the right lower extremity by Dr. 61 on 7/5 -Knee range of motion locked in extension.  Splint to the left arm. -PT, OT recommends SNF.  TOC working on placement  # COPD-chronic bronchitis secondary to history of tobacco use.  She has some wheezing this morning -Resume home inhalers, albuterol, duo nebs   # Hypothyroid-continue levothyroxine  # Hyperlipidemia-rosuvastatin 20 mg nightly   # Left eye ecchymosis-present on admission due to fall.  Slowly healing   # Depression/anxiety-fluoxetine 40 mg daily  - Mirtazapine 7.5 mg nightly - Lorazepam 1 mg p.o. nightly as needed for anxiety and  sleep resumed     Body mass index is 19.11 kg/m.  Net IO Since Admission: 1,544.66 mL [11/20/20 1338]      Status is: Inpatient  Remains inpatient appropriate because:Ongoing active pain requiring inpatient pain management.  Needs  SNF and waiting for insurance Auth  Dispo: The patient is from: Home              Anticipated d/c is to: SNF              Patient currently is not medically stable to d/c.  Wheezing today   difficult to place patient No   DVT prophylaxis:       enoxaparin (LOVENOX) injection 40 mg Start: 11/20/20 0800 Place TED hose Start: 11/19/20 1208 Foot Pump / plexipulse Start: 11/19/20 1208     Family Communication: Patient's caregiver was updated on 7/5.  I have tried to call her daughter with no success   All the records are reviewed and case discussed with Care Management/Social Worker. Management plans discussed with the patient, caregiver and they are in agreement.  CODE STATUS: Full Code Level of care: Med-Surg  TOTAL TIME TAKING CARE OF THIS PATIENT: 45 minutes.   More than 50% of the time was spent in counseling/coordination of care: YES  POSSIBLE D/C IN 1-2 DAYS, DEPENDING ON CLINICAL CONDITION.  Needs SNF placement   Delfino Lovett M.D on 11/20/2020 at 1:38 PM  [facility]   CC: Primary care physician; Lyndon Code, MD  Note: This dictation was prepared with Dragon dictation along with smaller phrase technology. Any transcriptional errors that result from this process are unintentional.

## 2020-11-21 DIAGNOSIS — J410 Simple chronic bronchitis: Secondary | ICD-10-CM | POA: Diagnosis not present

## 2020-11-21 DIAGNOSIS — I10 Essential (primary) hypertension: Secondary | ICD-10-CM | POA: Diagnosis not present

## 2020-11-21 DIAGNOSIS — R1319 Other dysphagia: Secondary | ICD-10-CM | POA: Diagnosis not present

## 2020-11-21 DIAGNOSIS — E0500 Thyrotoxicosis with diffuse goiter without thyrotoxic crisis or storm: Secondary | ICD-10-CM | POA: Diagnosis not present

## 2020-11-21 DIAGNOSIS — S82001A Unspecified fracture of right patella, initial encounter for closed fracture: Secondary | ICD-10-CM | POA: Diagnosis not present

## 2020-11-21 DIAGNOSIS — J984 Other disorders of lung: Secondary | ICD-10-CM | POA: Diagnosis not present

## 2020-11-21 DIAGNOSIS — M25561 Pain in right knee: Secondary | ICD-10-CM | POA: Diagnosis not present

## 2020-11-21 DIAGNOSIS — S52022D Displaced fracture of olecranon process without intraarticular extension of left ulna, subsequent encounter for closed fracture with routine healing: Secondary | ICD-10-CM | POA: Diagnosis not present

## 2020-11-21 DIAGNOSIS — E039 Hypothyroidism, unspecified: Secondary | ICD-10-CM | POA: Diagnosis not present

## 2020-11-21 DIAGNOSIS — R0602 Shortness of breath: Secondary | ICD-10-CM | POA: Diagnosis not present

## 2020-11-21 DIAGNOSIS — E782 Mixed hyperlipidemia: Secondary | ICD-10-CM | POA: Diagnosis not present

## 2020-11-21 DIAGNOSIS — E119 Type 2 diabetes mellitus without complications: Secondary | ICD-10-CM | POA: Diagnosis not present

## 2020-11-21 DIAGNOSIS — S82009D Unspecified fracture of unspecified patella, subsequent encounter for closed fracture with routine healing: Secondary | ICD-10-CM | POA: Diagnosis not present

## 2020-11-21 DIAGNOSIS — R2689 Other abnormalities of gait and mobility: Secondary | ICD-10-CM | POA: Diagnosis not present

## 2020-11-21 DIAGNOSIS — Z741 Need for assistance with personal care: Secondary | ICD-10-CM | POA: Diagnosis not present

## 2020-11-21 DIAGNOSIS — M797 Fibromyalgia: Secondary | ICD-10-CM | POA: Diagnosis not present

## 2020-11-21 DIAGNOSIS — R296 Repeated falls: Secondary | ICD-10-CM | POA: Diagnosis not present

## 2020-11-21 DIAGNOSIS — J309 Allergic rhinitis, unspecified: Secondary | ICD-10-CM | POA: Diagnosis not present

## 2020-11-21 DIAGNOSIS — J439 Emphysema, unspecified: Secondary | ICD-10-CM | POA: Diagnosis not present

## 2020-11-21 DIAGNOSIS — M6281 Muscle weakness (generalized): Secondary | ICD-10-CM | POA: Diagnosis not present

## 2020-11-21 DIAGNOSIS — I5022 Chronic systolic (congestive) heart failure: Secondary | ICD-10-CM | POA: Diagnosis not present

## 2020-11-21 DIAGNOSIS — Z4789 Encounter for other orthopedic aftercare: Secondary | ICD-10-CM | POA: Diagnosis not present

## 2020-11-21 DIAGNOSIS — J40 Bronchitis, not specified as acute or chronic: Secondary | ICD-10-CM | POA: Diagnosis not present

## 2020-11-21 DIAGNOSIS — S52022A Displaced fracture of olecranon process without intraarticular extension of left ulna, initial encounter for closed fracture: Secondary | ICD-10-CM | POA: Diagnosis not present

## 2020-11-21 DIAGNOSIS — R5381 Other malaise: Secondary | ICD-10-CM | POA: Diagnosis not present

## 2020-11-21 DIAGNOSIS — K589 Irritable bowel syndrome without diarrhea: Secondary | ICD-10-CM | POA: Diagnosis not present

## 2020-11-21 DIAGNOSIS — R279 Unspecified lack of coordination: Secondary | ICD-10-CM | POA: Diagnosis not present

## 2020-11-21 DIAGNOSIS — M810 Age-related osteoporosis without current pathological fracture: Secondary | ICD-10-CM | POA: Diagnosis not present

## 2020-11-21 DIAGNOSIS — J449 Chronic obstructive pulmonary disease, unspecified: Secondary | ICD-10-CM | POA: Diagnosis not present

## 2020-11-21 DIAGNOSIS — E063 Autoimmune thyroiditis: Secondary | ICD-10-CM | POA: Diagnosis not present

## 2020-11-21 DIAGNOSIS — M4800 Spinal stenosis, site unspecified: Secondary | ICD-10-CM | POA: Diagnosis not present

## 2020-11-21 DIAGNOSIS — E038 Other specified hypothyroidism: Secondary | ICD-10-CM | POA: Diagnosis not present

## 2020-11-21 DIAGNOSIS — M25522 Pain in left elbow: Secondary | ICD-10-CM | POA: Diagnosis not present

## 2020-11-21 DIAGNOSIS — S52032D Displaced fracture of olecranon process with intraarticular extension of left ulna, subsequent encounter for closed fracture with routine healing: Secondary | ICD-10-CM | POA: Diagnosis not present

## 2020-11-21 DIAGNOSIS — S42402D Unspecified fracture of lower end of left humerus, subsequent encounter for fracture with routine healing: Secondary | ICD-10-CM | POA: Diagnosis not present

## 2020-11-21 DIAGNOSIS — S82001D Unspecified fracture of right patella, subsequent encounter for closed fracture with routine healing: Secondary | ICD-10-CM | POA: Diagnosis not present

## 2020-11-21 DIAGNOSIS — S82034D Nondisplaced transverse fracture of right patella, subsequent encounter for closed fracture with routine healing: Secondary | ICD-10-CM | POA: Diagnosis not present

## 2020-11-21 LAB — CBC
HCT: 34.2 % — ABNORMAL LOW (ref 36.0–46.0)
Hemoglobin: 11.3 g/dL — ABNORMAL LOW (ref 12.0–15.0)
MCH: 32.8 pg (ref 26.0–34.0)
MCHC: 33 g/dL (ref 30.0–36.0)
MCV: 99.1 fL (ref 80.0–100.0)
Platelets: 168 10*3/uL (ref 150–400)
RBC: 3.45 MIL/uL — ABNORMAL LOW (ref 3.87–5.11)
RDW: 12.1 % (ref 11.5–15.5)
WBC: 8.2 10*3/uL (ref 4.0–10.5)
nRBC: 0 % (ref 0.0–0.2)

## 2020-11-21 LAB — BASIC METABOLIC PANEL
Anion gap: 9 (ref 5–15)
BUN: 8 mg/dL (ref 8–23)
CO2: 34 mmol/L — ABNORMAL HIGH (ref 22–32)
Calcium: 9.2 mg/dL (ref 8.9–10.3)
Chloride: 91 mmol/L — ABNORMAL LOW (ref 98–111)
Creatinine, Ser: 0.61 mg/dL (ref 0.44–1.00)
GFR, Estimated: >60 mL/min (ref >60)
Glucose, Bld: 141 mg/dL — ABNORMAL HIGH (ref 70–99)
Potassium: 4.4 mmol/L (ref 3.5–5.1)
Sodium: 134 mmol/L — ABNORMAL LOW (ref 135–145)

## 2020-11-21 MED ORDER — SENNOSIDES-DOCUSATE SODIUM 8.6-50 MG PO TABS
2.0000 | ORAL_TABLET | ORAL | Status: AC
  Administered 2020-11-21: 2 via ORAL
  Filled 2020-11-21: qty 2

## 2020-11-21 MED ORDER — ENOXAPARIN SODIUM 40 MG/0.4ML IJ SOSY: 40.0000 mg | PREFILLED_SYRINGE | INTRAMUSCULAR | 0 refills | Status: DC

## 2020-11-21 MED ORDER — ONDANSETRON HCL 4 MG PO TABS: 4.0000 mg | ORAL_TABLET | Freq: Four times a day (QID) | ORAL | 0 refills | Status: DC | PRN

## 2020-11-21 MED ORDER — POLYETHYLENE GLYCOL 3350 17 G PO PACK
17.0000 g | PACK | ORAL | Status: AC
  Administered 2020-11-21: 17 g via ORAL
  Filled 2020-11-21: qty 1

## 2020-11-21 MED ORDER — HYDROCODONE-ACETAMINOPHEN 5-325 MG PO TABS: 1.0000 | ORAL_TABLET | ORAL | 0 refills | Status: DC | PRN

## 2020-11-21 NOTE — Discharge Summary (Signed)
[place] at [facility]   PATIENT NAME: [name]    MR#:  [id number]  DATE OF BIRTH:  1945/06/02  DATE OF ADMISSION:  11/18/2020   ADMITTING PHYSICIAN: Amy N Cox, DO  DATE OF DISCHARGE: 11/21/2020  PRIMARY CARE PHYSICIAN: Lyndon Code, MD   ADMISSION DIAGNOSIS:  Nosebleed [R04.0] Patellar fracture [S82.009A] Fall [W19.XXXA] Elective surgery [Z41.9] Fall, initial encounter [W19.XXXA] Left elbow fracture, closed, initial encounter [S42.402A] Closed nondisplaced fracture of right patella, unspecified fracture morphology, initial encounter [S82.001A] DISCHARGE DIAGNOSIS:  Principal Problem:   Olecranon fracture Active Problems:   Bronchitis   Hypothyroidism   Hyperlipidemia, mixed   Pulmonary HTN (HCC)   Patellar fracture  SECONDARY DIAGNOSIS:   Past Medical History:  Diagnosis Date  . Anxiety   . Chest pain   . CHF (congestive heart failure) (HCC)   . COPD (chronic obstructive pulmonary disease) (HCC)   . Depression   . Diabetes mellitus without complication (HCC)   . Fibromyalgia   . Graves disease   . Hypertension   . IBS (irritable bowel syndrome)   . PTSD (post-traumatic stress disorder)   . Sinus problem   . Spinal stenosis    HOSPITAL COURSE:  75 y.o. female with medical history significant for anxiety, hyperlipidemia, hypothyroid, history of COPD, depression, history of tobacco use, admitted to the hospital with left olecranon and right patella fracture status post fall   #Closed displaced olecranon fracture on left elbow and closed nondisplaced patella fracture of right knee-present on admission - Secondary to mechanical fall - S/p ORIF of displaced left olecranon fracture and application of long-leg hinged brace in the right lower extremity by Dr. Joette Catching on 7/5 -Knee range of motion locked in extension.  Splint to the left arm. -At discharge -> nonweightbearing to the left arm and weightbearing as tolerated with a right knee brace locked  in extension per Ortho.  Outpatient follow-up with [facility] orthopedics in 10 to 14 days for splint removal, staple removal and x-rays of the left elbow and right knee.  # COPD-chronic bronchitis secondary to history of tobacco use. Resume home inhalers, albuterol, duo nebs   # Hypothyroid-continue levothyroxine  # Hyperlipidemia-rosuvastatin 20 mg nightly   # Left eye ecchymosis-present on admission due to fall.  Slowly healing   # Depression/anxiety-continue home medications  DISCHARGE CONDITIONS:  Stable CONSULTS OBTAINED:  Treatment Team:  Christena Flake, MD DRUG ALLERGIES:   Allergies  Allergen Reactions  . Celecoxib     Other reaction(s): Other (qualifier value) Patient is allergic to Celebrex and found that it caused heart failure. CHF  . Pregabalin     Other reaction(s): Localized superficial swelling of skin   DISCHARGE MEDICATIONS:   Allergies as of 11/21/2020       Reactions   Celecoxib    Other reaction(s): Other (qualifier value) Patient is allergic to Celebrex and found that it caused heart failure. CHF   Pregabalin    Other reaction(s): Localized superficial swelling of skin        Medication List     STOP taking these medications    mirtazapine 7.5 MG tablet Commonly known as: REMERON       TAKE these medications    acetaminophen 500 MG tablet Commonly known as: TYLENOL Take 1,000 mg by mouth in the morning, at noon, and at bedtime.   albuterol 108 (90 Base) MCG/ACT inhaler Commonly known as: VENTOLIN HFA Inhale 2 puffs into the lungs every 6 (six) hours  as needed for wheezing or shortness of breath. What changed: Another medication with the same name was changed. Make sure you understand how and when to take each.   albuterol 108 (90 Base) MCG/ACT inhaler Commonly known as: ProAir HFA INHALE 2 PUFFS INTO LUNGS EVERY 6 HOURS AS NEEDED FOR WHEEZING OR SHORTNESS OF BREATH What changed:  how much to take how to take this when to take  this reasons to take this additional instructions   alendronate 70 MG tablet Commonly known as: FOSAMAX Take 70 mg by mouth every Tuesday.   aspirin EC 81 MG tablet Take 81 mg by mouth daily. Swallow whole.   Azelastine HCl 137 MCG/SPRAY Soln Place 2 sprays into both nostrils in the morning and at bedtime.   enoxaparin 40 MG/0.4ML injection Commonly known as: LOVENOX Inject 0.4 mLs (40 mg total) into the skin daily.   ergocalciferol 1.25 MG (50000 UT) capsule Commonly known as: VITAMIN D2 Take 1 capsule by mouth every 7 (seven) days.   FLUoxetine 20 MG capsule Commonly known as: PROZAC Take 2 capsules (40 mg total) by mouth daily.   fluticasone 50 MCG/ACT nasal spray Commonly known as: FLONASE Place 2 sprays into both nostrils in the morning and at bedtime.   guaifenesin 400 MG Tabs tablet Commonly known as: HUMIBID E Take 800 mg by mouth every 6 (six) hours as needed (congestion/cough).   HYDROcodone-acetaminophen 5-325 MG tablet Commonly known as: NORCO/VICODIN Take 1-2 tablets by mouth every 4 (four) hours as needed for moderate pain.   hydrocortisone 2.5 % cream Place 1 application rectally 2 (two) times daily as needed (itching/hemorroids).   ipratropium-albuterol 0.5-2.5 (3) MG/3ML Soln Commonly known as: DUONEB One vial 3 x  aday for copd with hypoxia What changed:  how much to take how to take this when to take this additional instructions   levothyroxine 88 MCG tablet Commonly known as: SYNTHROID Take 88 mcg by mouth daily before breakfast. Take 30 minutes before food and with a full glass of water   LORazepam 1 MG tablet Commonly known as: ATIVAN Take 1 tablet (1 mg total) by mouth at bedtime.   multivitamin with minerals Tabs tablet Take 1 tablet by mouth daily. Multivitamin for Seniors   ondansetron 4 MG tablet Commonly known as: ZOFRAN Take 1 tablet (4 mg total) by mouth every 6 (six) hours as needed for nausea.   rosuvastatin 20 MG  tablet Commonly known as: Crestor Take 1 tablet (20 mg total) by mouth daily.   Symbicort 160-4.5 MCG/ACT inhaler Generic drug: budesonide-formoterol Inhale 2 puffs into the lungs 2 (two) times daily.   tiotropium 18 MCG inhalation capsule Commonly known as: SPIRIVA Place 18 mcg into inhaler and inhale in the morning and at bedtime.   Vitamin B-12 1000 MCG/15ML Liqd Take 1,200 mcg by mouth daily.       ASK your doctor about these medications    traMADol 50 MG tablet Commonly known as: ULTRAM Take one tab po tid for pain       DISCHARGE INSTRUCTIONS:   DIET:  Regular diet DISCHARGE CONDITION:  Stable ACTIVITY:  Activity as tolerated Non-weightbearing to the left arm. Weightbearing as tolerated with the right knee brace locked in extension. OXYGEN:  Home Oxygen: No.  Oxygen Delivery: room air DISCHARGE LOCATION:  nursing home   If you experience worsening of your admission symptoms, develop shortness of breath, life threatening emergency, suicidal or homicidal thoughts you must seek medical attention immediately by calling 911 or  calling your MD immediately  if symptoms less severe.  You Must read complete instructions/literature along with all the possible adverse reactions/side effects for all the Medicines you take and that have been prescribed to you. Take any new Medicines after you have completely understood and accpet all the possible adverse reactions/side effects.   Please note  You were cared for by a hospitalist during your hospital stay. If you have any questions about your discharge medications or the care you received while you were in the hospital after you are discharged, you can call the unit and asked to speak with the hospitalist on call if the hospitalist that took care of you is not available. Once you are discharged, your primary care physician will handle any further medical issues. Please note that NO REFILLS for any discharge medications will be  authorized once you are discharged, as it is imperative that you return to your primary care physician (or establish a relationship with a primary care physician if you do not have one) for your aftercare needs so that they can reassess your need for medications and monitor your lab values.    On the day of Discharge:  VITAL SIGNS:  Blood pressure (!) 176/70, pulse 99, temperature 98.7 F (37.1 C), temperature source Oral, resp. rate 17, height 5\' 6"  (1.676 m), weight 53.7 kg, SpO2 96 %. PHYSICAL EXAMINATION:  GENERAL:  75 y.o.-year-old patient lying in the bed with no acute distress.  EYES: Pupils equal, round, reactive to light and accommodation. No scleral icterus. Extraocular muscles intact.  HEENT: Head atraumatic, normocephalic. Oropharynx and nasopharynx clear.  NECK:  Supple, no jugular venous distention. No thyroid enlargement, no tenderness.  LUNGS: Normal breath sounds bilaterally, no wheezing, rales,rhonchi or crepitation. No use of accessory muscles of respiration.  CARDIOVASCULAR: S1, S2 normal. No murmurs, rubs, or gallops.  ABDOMEN: Soft, non-tender, non-distended. Bowel sounds present. No organomegaly or mass.  EXTREMITIES: Range of motion locked in extension.  Splint intact to the left arm. NEUROLOGIC: Cranial nerves II through XII are intact.  Nonfocal.  PSYCHIATRIC: The patient is alert and oriented x 3.  SKIN: No obvious rash, lesion, or ulcer.  DATA REVIEW:   CBC Recent Labs  Lab 11/21/20 0640  WBC 8.2  HGB 11.3*  HCT 34.2*  PLT 168    Chemistries  Recent Labs  Lab 11/21/20 0640  NA 134*  K 4.4  CL 91*  CO2 34*  GLUCOSE 141*  BUN 8  CREATININE 0.61  CALCIUM 9.2     Outpatient follow-up  Contact information for follow-up providers     01/22/21, PA-C. Schedule an appointment as soon as possible for a visit in 2 week(s).   Specialty: Physician Assistant Why: Staple removal and repeat x-rays.[facility] Discharge F/UP Contact  information: [address] [place] [phone]         [phone], MD. Schedule an appointment as soon as possible for a visit in 1 week(s).   Specialties: Internal Medicine, Cardiology Why: [facility] Discharge F/UP Contact information: [address] [place] [phone]         [phone], MD. Schedule an appointment as soon as possible for a visit in 2 week(s).   Specialty: Cardiology Why: [facility] Discharge F/UP Contact information: [address] [place] [phone]              Contact information for after-discharge care  Destination     [facility] .   Service: Skilled Nursing Contact information: [address] [facility] [place] [id number] [phone]                     30 Day Unplanned Readmission Risk Score    Flowsheet Row ED to Hosp-Admission (Current) from 11/18/2020 in [facility] (1A)  30 Day Unplanned Readmission Risk Score (%) 11.03 Filed at 11/21/2020 0801       This score is the patient's risk of an unplanned readmission within 30 days of being discharged (0 -100%). The score is based on dignosis, age, lab data, medications, orders, and past utilization.   Low:  0-14.9   Medium: 15-21.9   High: 22-29.9   Extreme: 30 and above           Management plans discussed with the patient, family and they are in agreement.  CODE STATUS: Full Code   TOTAL TIME TAKING CARE OF THIS PATIENT: 45 minutes.    Delfino LovettVipul Yanice Maqueda M.D on 11/21/2020 at 8:45 AM  [facility]   CC: Primary care physician; Lyndon CodeKhan, Fozia M, MD   Note: This dictation was prepared with Dragon dictation along with smaller phrase technology. Any transcriptional errors that result from this process are unintentional.

## 2020-11-21 NOTE — TOC Progression Note (Signed)
Transition of Care [facility]) - Progression Note    Patient Details  Name: [name] MRN: [id number] Date of Birth: [birth year]  [facility] CM/SW Contact  Barrie Dunker, RN Phone Number: 11/21/2020, 9:16 AM  Clinical Narrative:     The patient requested that [facility] CM call Tilden Fossa her personal care aide to let her know that she will be Discharged to [facility] today, I called and spoke with Verlon Au, she stated understanding and gave thanks for the call.       Expected Discharge Plan and Services           Expected Discharge Date: 11/21/20                                     Social Determinants of Health (SDOH) Interventions    Readmission Risk Interventions No flowsheet data found.

## 2020-11-21 NOTE — TOC Progression Note (Signed)
Transition of Care [facility]) - Progression Note    Patient Details  Name: [name] MRN: [id number] Date of Birth: 1946-03-04  Transition of Care [facility]) CM/SW Contact  Barrie Dunker, RN Phone Number: 11/21/2020, 8:29 AM  Clinical Narrative:     Iline Oven approval to go to [facility] SNF [facility] Auth approval number [id number] start date 7/7 next review date 7/11       Expected Discharge Plan and Services                                                 Social Determinants of Health (SDOH) Interventions    Readmission Risk Interventions No flowsheet data found.

## 2020-11-21 NOTE — Discharge Instructions (Signed)
Diet: As you were doing prior to hospitalization   Shower:  May shower but keep the wounds dry, use an occlusive plastic wrap, NO SOAKING IN TUB.  If the bandage gets wet, change with a clean dry gauze.  Dressing:  Keep left arm in splint at all times.  Can remove knee brace only for showering purposes but keep the knee in extension (straight)  Activity:  Increase activity slowly as tolerated, but follow the weight bearing instructions below.  No lifting or driving for 6 weeks.  Weight Bearing:   Non-weightbearing to the left arm.  Weightbear as tolerated to the right leg with the knee locked in the knee brace.  To prevent constipation: you may use a stool softener such as -  Colace (over the counter) 100 mg by mouth twice a day  Drink plenty of fluids (prune juice may be helpful) and high fiber foods Miralax (over the counter) for constipation as needed.    Itching:  If you experience itching with your medications, try taking only a single pain pill, or even half a pain pill at a time.  You may take up to 10 pain pills per day, and you can also use benadryl over the counter for itching or also to help with sleep.   Precautions:  If you experience chest pain or shortness of breath - call 911 immediately for transfer to the hospital emergency department!!  If you develop a fever greater that 101 F, purulent drainage from wound, increased redness or drainage from wound, or calf pain-Call [facility]                                              Follow- Up Appointment:  Please call for an appointment to be seen in 2 weeks at [facility]

## 2020-11-21 NOTE — Progress Notes (Signed)
Subjective: 2 Days Post-Op Procedure(s) (LRB): OPEN REDUCTION INTERNAL FIXATION (ORIF) ELBOW/OLECRANON FRACTURE (Left) Application of long-leg hinged brace right lower extremity. Patient reports pain as mild in the left elbow and in the right knee.  States that her pain has improved when compared to yesterday. Patient is well, and has had no acute complaints or problems Plan is to go Skilled nursing facility after hospital stay. Negative for chest pain and shortness of breath Fever: no Gastrointestinal:Negative for nausea and vomiting  Objective: Vital signs in last 24 hours: Temp:  [98 F (36.7 C)-99.1 F (37.3 C)] 98.9 F (37.2 C) (07/06 2031) Pulse Rate:  [82-92] 92 (07/06 2156) Resp:  [17-18] 18 (07/06 2031) BP: (140-168)/(66-76) 158/66 (07/06 2156) SpO2:  [96 %-100 %] 96 % (07/06 2031)  Intake/Output from previous day:  Intake/Output Summary (Last 24 hours) at 11/21/2020 0708 Last data filed at 11/21/2020 0258 Gross per 24 hour  Intake 240 ml  Output 800 ml  Net -560 ml    Intake/Output this shift: No intake/output data recorded.  Labs: Recent Labs    11/18/20 2011 11/19/20 0619 11/20/20 0417 11/21/20 0640  HGB 11.9* 11.3* 10.4* 11.3*   Recent Labs    11/20/20 0417 11/21/20 0640  WBC 7.8 8.2  RBC 3.21* 3.45*  HCT 31.9* 34.2*  PLT 151 168   Recent Labs    11/19/20 0619 11/20/20 0417  NA 128* 132*  K 4.3 3.9  CL 91* 95*  CO2 33* 33*  BUN 19 13  CREATININE 0.62 0.60  GLUCOSE 132* 108*  CALCIUM 9.3 8.5*   Recent Labs    11/18/20 2011 11/19/20 0619  INR 0.9 0.9   EXAM General - Patient is Alert, Appropriate, and Oriented Extremity - Left arm in posterior splint.  Intact to light touch to the dorsal and volar aspect of her fingers. Swelling has improved to the left hand this morning. Right knee ROM brace intact, locked in extension. Dorsiflexion and plantarflexion to the right leg intact. Motor Function - intact, moving foot and toes well on  exam.  Abdomen soft with normal bowel sounds. Mild wheeze noted to bilateral upper lung fields.  Improved compared to yesterday.  Past Medical History:  Diagnosis Date   Anxiety    Chest pain    CHF (congestive heart failure) (HCC)    COPD (chronic obstructive pulmonary disease) (HCC)    Depression    Diabetes mellitus without complication (HCC)    Fibromyalgia    Graves disease    Hypertension    IBS (irritable bowel syndrome)    PTSD (post-traumatic stress disorder)    Sinus problem    Spinal stenosis     Assessment/Plan: 2 Days Post-Op Procedure(s) (LRB): OPEN REDUCTION INTERNAL FIXATION (ORIF) ELBOW/OLECRANON FRACTURE (Left) Application of long-leg hinged brace right lower extremity. Principal Problem:   Olecranon fracture Active Problems:   Bronchitis   Hypothyroidism   Hyperlipidemia, mixed   Pulmonary HTN (HCC)   Patellar fracture  Estimated body mass index is 19.11 kg/m as calculated from the following:   Height as of this encounter: 5\' 6"  (1.676 m).   Weight as of this encounter: 53.7 kg. Advance diet Up with therapy D/C IV fluids when tolerating po intake.  Labs reviewed this AM, Hg 11.3 this AM.  WBC 8.2. Splint intact to the left arm, moving fingers without pain. Knee ROM locked in extension. Begin working on BM.  She states she is passing gas without pain. Mild wheeze, albuterol as needed. Continue working  with PT/OT. With restrictions placed on both the left arm and right leg, will need SNF upon discharge.  Upon discharge, follow-up with [facility] in 10-14 days for splint removal, staple removal and x-rays of the left elbow and right knee. Continue Lovenox 40mg  daily for 14 days for DVT prophylaxis.  DVT Prophylaxis - Lovenox and Foot Pumps Non-weightbearing to the left arm. Weightbearing as tolerated with the right knee brace locked in extension.  , PA-C [facility] 11/21/2020, 7:08 AM

## 2020-11-21 NOTE — Progress Notes (Signed)
[facility] here to transport pt

## 2020-11-21 NOTE — Progress Notes (Signed)
Report given to [facility].

## 2020-11-21 NOTE — Progress Notes (Signed)
Attempted to call report X2, no answer. Left message for nurse to return call.

## 2020-11-21 NOTE — Care Management Important Message (Signed)
Important Message  Patient Details  Name: [name] MRN: [id number] Date of Birth: 07-05-1945   Medicare Important Message Given:  Other (see comment)  Patient is in an isolation room so I called her [phone]) but there was no answer. Will try again.  Olegario Messier A Arminta Gamm 11/21/2020, 10:16 AM

## 2020-11-22 ENCOUNTER — Telehealth: Payer: Self-pay

## 2020-11-24 DIAGNOSIS — J449 Chronic obstructive pulmonary disease, unspecified: Secondary | ICD-10-CM | POA: Diagnosis not present

## 2020-11-26 ENCOUNTER — Other Ambulatory Visit: Payer: Self-pay | Admitting: *Deleted

## 2020-11-26 DIAGNOSIS — K589 Irritable bowel syndrome without diarrhea: Secondary | ICD-10-CM | POA: Diagnosis not present

## 2020-11-26 DIAGNOSIS — S52022A Displaced fracture of olecranon process without intraarticular extension of left ulna, initial encounter for closed fracture: Secondary | ICD-10-CM | POA: Diagnosis not present

## 2020-11-26 DIAGNOSIS — I10 Essential (primary) hypertension: Secondary | ICD-10-CM | POA: Diagnosis not present

## 2020-11-26 DIAGNOSIS — S82001A Unspecified fracture of right patella, initial encounter for closed fracture: Secondary | ICD-10-CM | POA: Diagnosis not present

## 2020-11-26 DIAGNOSIS — J984 Other disorders of lung: Secondary | ICD-10-CM | POA: Diagnosis not present

## 2020-11-26 DIAGNOSIS — R0602 Shortness of breath: Secondary | ICD-10-CM | POA: Diagnosis not present

## 2020-11-26 DIAGNOSIS — M4800 Spinal stenosis, site unspecified: Secondary | ICD-10-CM | POA: Diagnosis not present

## 2020-11-26 DIAGNOSIS — I5022 Chronic systolic (congestive) heart failure: Secondary | ICD-10-CM | POA: Diagnosis not present

## 2020-11-26 DIAGNOSIS — E119 Type 2 diabetes mellitus without complications: Secondary | ICD-10-CM | POA: Diagnosis not present

## 2020-11-26 DIAGNOSIS — J410 Simple chronic bronchitis: Secondary | ICD-10-CM | POA: Diagnosis not present

## 2020-11-26 DIAGNOSIS — M797 Fibromyalgia: Secondary | ICD-10-CM | POA: Diagnosis not present

## 2020-11-26 NOTE — Patient Outreach (Signed)
[facility] Customer service manager [facility]) Care Management  11/26/2020  ERLEAN MEALOR 05-24-1945 [id number]  Outreach attempt to patient. No answer and unable to leave voicemail message due to called patient's number 2 times, after multiple rings there was no answer.  Plan: RN Health Coach will call patient within the month of August.  [name] RN, BSN [facility] Care Management  RN Health Coach [phone] [email or web address]

## 2020-11-27 NOTE — Telephone Encounter (Signed)
Note is done.  

## 2020-11-27 NOTE — Progress Notes (Deleted)
Cardiology Office Note:    Date:  11/27/2020   ID:  [name], DOB August 10, 1945, MRN [id number]  PCP:  Lyndon Code, MD  [facility] Cardiologist:  Lorine Bears, MD  [facility] Electrophysiologist:  None   Referring MD: Lyndon Code, MD   Chief Complaint: 6 month follow-up  History of Present Illness:    [name] is a 75 y.o. female with a hx of mild nonobstructive CAD, pulmonary HTN, exertional dyspnea, COPD with previous tobacco use on home O2, HLD, DM2 who presents for 6 month follow-up.   Echo with her PCP 03/2020 showed normal LVEF, diastolic dysfunction, moderate pulmonary HTN with estimated PASP . She had coronary calcification on CT scan. As such, recommended R/L heart cath. Subsequent [facility] 03/2020 with hyperdynamic LVSF, LVEDP mildly elevated, mild pulmonary HTN, prox-mid Cx 20% stenosis, LAD/RCA with mild luminal irregularities. Medical therapy was recommended.   Last seen 05/08/20 and had been recently treated for URI by PCP, stable from a cardiac perspective.   Past Medical History:  Diagnosis Date   Anxiety    Chest pain    CHF (congestive heart failure) (HCC)    COPD (chronic obstructive pulmonary disease) (HCC)    Depression    Diabetes mellitus without complication (HCC)    Fibromyalgia    Graves disease    Hypertension    IBS (irritable bowel syndrome)    PTSD (post-traumatic stress disorder)    Sinus problem    Spinal stenosis     Past Surgical History:  Procedure Laterality Date   ABDOMINAL HYSTERECTOMY     CAST APPLICATION  11/19/2020   Procedure: Application of long-leg hinged brace right lower extremity.;  Surgeon: Christena Flake, MD;  Location: [facility] ORS;  Service: Orthopedics;;   ESOPHAGOGASTRODUODENOSCOPY (EGD) WITH PROPOFOL N/A 01/10/2015   Procedure: ESOPHAGOGASTRODUODENOSCOPY (EGD) WITH PROPOFOL;  Surgeon: Wallace Cullens, MD;  Location: [facility] ENDOSCOPY;  Service: Gastroenterology;  Laterality: N/A;   ORIF ELBOW FRACTURE Left  11/19/2020   Procedure: OPEN REDUCTION INTERNAL FIXATION (ORIF) ELBOW/OLECRANON FRACTURE;  Surgeon: Christena Flake, MD;  Location: [facility] ORS;  Service: Orthopedics;  Laterality: Left;   RECTAL SURGERY     RIGHT/LEFT HEART CATH AND CORONARY ANGIOGRAPHY Bilateral 04/08/2020   Procedure: RIGHT/LEFT HEART CATH AND CORONARY ANGIOGRAPHY;  Surgeon: Iran Ouch, MD;  Location: [facility] INVASIVE CV LAB;  Service: Cardiovascular;  Laterality: Bilateral;   SAVORY DILATION N/A 01/10/2015   Procedure: SAVORY DILATION;  Surgeon: Wallace Cullens, MD;  Location: [facility] ENDOSCOPY;  Service: Gastroenterology;  Laterality: N/A;   toenail removal Bilateral    great toes   TONSILLECTOMY AND ADENOIDECTOMY      Current Medications: No outpatient medications have been marked as taking for the 11/29/20 encounter (Appointment) with Fransico Michael, Shawntina Diffee H, PA-C.     Allergies:   Celecoxib and Pregabalin   Social History   Socioeconomic History   Marital status: Widowed    Spouse name: Not on file   Number of children: 2   Years of education: Not on file   Highest education level: Not on file  Occupational History   Not on file  Tobacco Use   Smoking status: Former    Packs/day: 0.50    Years: 25.00    Pack years: 12.50    Types: Cigarettes    Quit date: 06/03/2016    Years since quitting: 4.4   Smokeless tobacco: Never  Vaping Use   Vaping Use: Never used  Substance and Sexual  Activity   Alcohol use: No    Alcohol/week: 0.0 standard drinks   Drug use: No   Sexual activity: Not Currently  Other Topics Concern   Not on file  Social History Narrative   Lives by herself; 1 daughter Marcelino Duster in [place]: 1 daughter in [place]    Social Determinants of Health   Financial Resource Strain: Not on file  Food Insecurity: No Food Insecurity   Worried About Programme researcher, broadcasting/film/video in the Last Year: Never true   Barista in the Last Year: Never true  Transportation Needs: No Transportation Needs   Lack of  Transportation (Medical): No   Lack of Transportation (Non-Medical): No  Physical Activity: Not on file  Stress: Not on file  Social Connections: Not on file     Family History: The patient's ***family history includes Alcohol abuse in her father, mother, and sister; Asthma in her sister; COPD in her sister; Depression in her father, maternal grandmother, mother, and sister; Diabetes in her paternal grandmother and sister; Heart disease in her sister; Hypertension in her sister; Stroke in her paternal grandmother; Varicose Veins in her mother.  ROS:   Please see the history of present illness.    *** All other systems reviewed and are negative.  EKGs/Labs/Other Studies Reviewed:    The following studies were reviewed today:  Cardiac cath 04/08/20 There is hyperdynamic left ventricular systolic function. LV end diastolic pressure is mildly elevated. The left ventricular ejection fraction is greater than 65% by visual estimate. Prox Cx to Mid Cx lesion is 20% stenosed.   1.  Mildly calcified coronary arteries with mild nonobstructive disease. 2.  Hyperdynamic LV systolic function with an EF of greater than 70%. 3.  Right heart catheterization showed mildly elevated filling pressures, mild pulmonary hypertension and normal cardiac output.   RA: 10 mmHg RV: 50/5 mmHg PCW: 16 mmHg PA: 43/17 with a mean of 30 mmHg Cardiac output: 4.40 with a cardiac index of 2.76.   Recommendations: Recommend medical therapy for nonobstructive coronary artery disease. Pulmonary hypertension does not seem to be severe enough to require vasodilator therapy. A small dose diuretic can be considered as a trial. Suspect that the majority of her exertional dyspnea is related to underlying lung disease.    EKG:  EKG is *** ordered today.  The ekg ordered today demonstrates ***  Recent Labs: 11/21/2020: BUN 8; Creatinine, Ser 0.61; Hemoglobin 11.3; Platelets 168; Potassium 4.4; Sodium 134  Recent Lipid  Panel No results found for: CHOL, TRIG, HDL, CHOLHDL, VLDL, LDLCALC, LDLDIRECT   Risk Assessment/Calculations:   {Does this patient have ATRIAL FIBRILLATION?[phone]}   Physical Exam:    VS:  There were no vitals taken for this visit.    Wt Readings from Last 3 Encounters:  11/18/20 118 lb 6.2 oz (53.7 kg)  10/29/20 118 lb 6.4 oz (53.7 kg)  08/28/20 122 lb (55.3 kg)     GEN: *** Well nourished, well developed in no acute distress HEENT: Normal NECK: No JVD; No carotid bruits LYMPHATICS: No lymphadenopathy CARDIAC: ***RRR, no murmurs, rubs, gallops RESPIRATORY:  Clear to auscultation without rales, wheezing or rhonchi  ABDOMEN: Soft, non-tender, non-distended MUSCULOSKELETAL:  No edema; No deformity  SKIN: Warm and dry NEUROLOGIC:  Alert and oriented x 3 PSYCHIATRIC:  Normal affect   ASSESSMENT:    No diagnosis found. PLAN:    In order of problems listed above:  Mild nonobstructive CAD  Mild pulmonary HTN DOE  HLD  COPD  Disposition: Follow up {follow up:15908} with ***   Shared Decision Making/Informed Consent   {Are you ordering a CV Procedure (e.g. stress test, cath, DCCV, TEE, etc)?   Press F2        :[id number]}    Signed, Kein Carlberg David Stall, PA-C  11/27/2020 12:40 PM    [facility]

## 2020-11-28 DIAGNOSIS — K589 Irritable bowel syndrome without diarrhea: Secondary | ICD-10-CM | POA: Diagnosis not present

## 2020-11-29 ENCOUNTER — Ambulatory Visit: Payer: Medicare Other | Admitting: Medical

## 2020-12-03 DIAGNOSIS — S52032D Displaced fracture of olecranon process with intraarticular extension of left ulna, subsequent encounter for closed fracture with routine healing: Secondary | ICD-10-CM | POA: Diagnosis not present

## 2020-12-03 DIAGNOSIS — S82034D Nondisplaced transverse fracture of right patella, subsequent encounter for closed fracture with routine healing: Secondary | ICD-10-CM | POA: Diagnosis not present

## 2020-12-05 ENCOUNTER — Inpatient Hospital Stay: Payer: Medicare Other | Admitting: Internal Medicine

## 2020-12-05 ENCOUNTER — Encounter (INDEPENDENT_AMBULATORY_CARE_PROVIDER_SITE_OTHER): Payer: Medicare Other | Admitting: Vascular Surgery

## 2020-12-06 DIAGNOSIS — S82001D Unspecified fracture of right patella, subsequent encounter for closed fracture with routine healing: Secondary | ICD-10-CM | POA: Diagnosis not present

## 2020-12-06 DIAGNOSIS — I5022 Chronic systolic (congestive) heart failure: Secondary | ICD-10-CM | POA: Diagnosis not present

## 2020-12-06 DIAGNOSIS — S52022D Displaced fracture of olecranon process without intraarticular extension of left ulna, subsequent encounter for closed fracture with routine healing: Secondary | ICD-10-CM | POA: Diagnosis not present

## 2020-12-06 DIAGNOSIS — E038 Other specified hypothyroidism: Secondary | ICD-10-CM | POA: Diagnosis not present

## 2020-12-06 DIAGNOSIS — M810 Age-related osteoporosis without current pathological fracture: Secondary | ICD-10-CM | POA: Diagnosis not present

## 2020-12-06 DIAGNOSIS — J984 Other disorders of lung: Secondary | ICD-10-CM | POA: Diagnosis not present

## 2020-12-06 DIAGNOSIS — K589 Irritable bowel syndrome without diarrhea: Secondary | ICD-10-CM | POA: Diagnosis not present

## 2020-12-06 DIAGNOSIS — E782 Mixed hyperlipidemia: Secondary | ICD-10-CM | POA: Diagnosis not present

## 2020-12-06 DIAGNOSIS — E119 Type 2 diabetes mellitus without complications: Secondary | ICD-10-CM | POA: Diagnosis not present

## 2020-12-06 DIAGNOSIS — I10 Essential (primary) hypertension: Secondary | ICD-10-CM | POA: Diagnosis not present

## 2020-12-10 DIAGNOSIS — S82009D Unspecified fracture of unspecified patella, subsequent encounter for closed fracture with routine healing: Secondary | ICD-10-CM | POA: Diagnosis not present

## 2020-12-12 DIAGNOSIS — M810 Age-related osteoporosis without current pathological fracture: Secondary | ICD-10-CM | POA: Diagnosis not present

## 2020-12-12 DIAGNOSIS — I6523 Occlusion and stenosis of bilateral carotid arteries: Secondary | ICD-10-CM | POA: Diagnosis not present

## 2020-12-12 DIAGNOSIS — S82001D Unspecified fracture of right patella, subsequent encounter for closed fracture with routine healing: Secondary | ICD-10-CM | POA: Diagnosis not present

## 2020-12-12 DIAGNOSIS — J449 Chronic obstructive pulmonary disease, unspecified: Secondary | ICD-10-CM | POA: Diagnosis not present

## 2020-12-12 DIAGNOSIS — R296 Repeated falls: Secondary | ICD-10-CM | POA: Diagnosis not present

## 2020-12-12 DIAGNOSIS — E871 Hypo-osmolality and hyponatremia: Secondary | ICD-10-CM | POA: Diagnosis not present

## 2020-12-12 DIAGNOSIS — S52022D Displaced fracture of olecranon process without intraarticular extension of left ulna, subsequent encounter for closed fracture with routine healing: Secondary | ICD-10-CM | POA: Diagnosis not present

## 2020-12-12 DIAGNOSIS — I951 Orthostatic hypotension: Secondary | ICD-10-CM | POA: Diagnosis not present

## 2020-12-12 DIAGNOSIS — M47812 Spondylosis without myelopathy or radiculopathy, cervical region: Secondary | ICD-10-CM | POA: Diagnosis not present

## 2020-12-12 DIAGNOSIS — I471 Supraventricular tachycardia: Secondary | ICD-10-CM | POA: Diagnosis not present

## 2020-12-12 DIAGNOSIS — J9611 Chronic respiratory failure with hypoxia: Secondary | ICD-10-CM | POA: Diagnosis not present

## 2020-12-12 DIAGNOSIS — M47816 Spondylosis without myelopathy or radiculopathy, lumbar region: Secondary | ICD-10-CM | POA: Diagnosis not present

## 2020-12-12 DIAGNOSIS — M1990 Unspecified osteoarthritis, unspecified site: Secondary | ICD-10-CM | POA: Diagnosis not present

## 2020-12-12 DIAGNOSIS — M5136 Other intervertebral disc degeneration, lumbar region: Secondary | ICD-10-CM | POA: Diagnosis not present

## 2020-12-12 DIAGNOSIS — I5043 Acute on chronic combined systolic (congestive) and diastolic (congestive) heart failure: Secondary | ICD-10-CM | POA: Diagnosis not present

## 2020-12-12 DIAGNOSIS — I272 Pulmonary hypertension, unspecified: Secondary | ICD-10-CM | POA: Diagnosis not present

## 2020-12-12 DIAGNOSIS — M5481 Occipital neuralgia: Secondary | ICD-10-CM | POA: Diagnosis not present

## 2020-12-12 DIAGNOSIS — M4800 Spinal stenosis, site unspecified: Secondary | ICD-10-CM | POA: Diagnosis not present

## 2020-12-12 DIAGNOSIS — G43909 Migraine, unspecified, not intractable, without status migrainosus: Secondary | ICD-10-CM | POA: Diagnosis not present

## 2020-12-12 DIAGNOSIS — E782 Mixed hyperlipidemia: Secondary | ICD-10-CM | POA: Diagnosis not present

## 2020-12-12 DIAGNOSIS — E119 Type 2 diabetes mellitus without complications: Secondary | ICD-10-CM | POA: Diagnosis not present

## 2020-12-12 DIAGNOSIS — E039 Hypothyroidism, unspecified: Secondary | ICD-10-CM | POA: Diagnosis not present

## 2020-12-12 DIAGNOSIS — G9341 Metabolic encephalopathy: Secondary | ICD-10-CM | POA: Diagnosis not present

## 2020-12-13 DIAGNOSIS — M810 Age-related osteoporosis without current pathological fracture: Secondary | ICD-10-CM | POA: Diagnosis not present

## 2020-12-13 DIAGNOSIS — M4800 Spinal stenosis, site unspecified: Secondary | ICD-10-CM | POA: Diagnosis not present

## 2020-12-13 DIAGNOSIS — E782 Mixed hyperlipidemia: Secondary | ICD-10-CM | POA: Diagnosis not present

## 2020-12-13 DIAGNOSIS — I5043 Acute on chronic combined systolic (congestive) and diastolic (congestive) heart failure: Secondary | ICD-10-CM | POA: Diagnosis not present

## 2020-12-13 DIAGNOSIS — S52022D Displaced fracture of olecranon process without intraarticular extension of left ulna, subsequent encounter for closed fracture with routine healing: Secondary | ICD-10-CM | POA: Diagnosis not present

## 2020-12-13 DIAGNOSIS — M5481 Occipital neuralgia: Secondary | ICD-10-CM | POA: Diagnosis not present

## 2020-12-13 DIAGNOSIS — E039 Hypothyroidism, unspecified: Secondary | ICD-10-CM | POA: Diagnosis not present

## 2020-12-13 DIAGNOSIS — G9341 Metabolic encephalopathy: Secondary | ICD-10-CM | POA: Diagnosis not present

## 2020-12-13 DIAGNOSIS — G43909 Migraine, unspecified, not intractable, without status migrainosus: Secondary | ICD-10-CM | POA: Diagnosis not present

## 2020-12-13 DIAGNOSIS — E871 Hypo-osmolality and hyponatremia: Secondary | ICD-10-CM | POA: Diagnosis not present

## 2020-12-13 DIAGNOSIS — I951 Orthostatic hypotension: Secondary | ICD-10-CM | POA: Diagnosis not present

## 2020-12-13 DIAGNOSIS — I471 Supraventricular tachycardia: Secondary | ICD-10-CM | POA: Diagnosis not present

## 2020-12-13 DIAGNOSIS — J9611 Chronic respiratory failure with hypoxia: Secondary | ICD-10-CM | POA: Diagnosis not present

## 2020-12-13 DIAGNOSIS — M47812 Spondylosis without myelopathy or radiculopathy, cervical region: Secondary | ICD-10-CM | POA: Diagnosis not present

## 2020-12-13 DIAGNOSIS — S82001D Unspecified fracture of right patella, subsequent encounter for closed fracture with routine healing: Secondary | ICD-10-CM | POA: Diagnosis not present

## 2020-12-13 DIAGNOSIS — I6523 Occlusion and stenosis of bilateral carotid arteries: Secondary | ICD-10-CM | POA: Diagnosis not present

## 2020-12-13 DIAGNOSIS — I272 Pulmonary hypertension, unspecified: Secondary | ICD-10-CM | POA: Diagnosis not present

## 2020-12-13 DIAGNOSIS — E119 Type 2 diabetes mellitus without complications: Secondary | ICD-10-CM | POA: Diagnosis not present

## 2020-12-13 DIAGNOSIS — M47816 Spondylosis without myelopathy or radiculopathy, lumbar region: Secondary | ICD-10-CM | POA: Diagnosis not present

## 2020-12-13 DIAGNOSIS — J449 Chronic obstructive pulmonary disease, unspecified: Secondary | ICD-10-CM | POA: Diagnosis not present

## 2020-12-13 DIAGNOSIS — M5136 Other intervertebral disc degeneration, lumbar region: Secondary | ICD-10-CM | POA: Diagnosis not present

## 2020-12-14 ENCOUNTER — Inpatient Hospital Stay
Admission: EM | Admit: 2020-12-14 | Discharge: 2020-12-17 | DRG: 291 | Disposition: A | Discharge status: Home Health | Payer: Medicare Other | Attending: Internal Medicine | Admitting: Internal Medicine

## 2020-12-14 ENCOUNTER — Emergency Department: Payer: Medicare Other

## 2020-12-14 ENCOUNTER — Other Ambulatory Visit: Payer: Self-pay

## 2020-12-14 DIAGNOSIS — Z87891 Personal history of nicotine dependence: Secondary | ICD-10-CM | POA: Diagnosis not present

## 2020-12-14 DIAGNOSIS — M797 Fibromyalgia: Secondary | ICD-10-CM | POA: Diagnosis not present

## 2020-12-14 DIAGNOSIS — R0602 Shortness of breath: Secondary | ICD-10-CM | POA: Diagnosis not present

## 2020-12-14 DIAGNOSIS — I509 Heart failure, unspecified: Secondary | ICD-10-CM

## 2020-12-14 DIAGNOSIS — J449 Chronic obstructive pulmonary disease, unspecified: Secondary | ICD-10-CM | POA: Diagnosis present

## 2020-12-14 DIAGNOSIS — S52025D Nondisplaced fracture of olecranon process without intraarticular extension of left ulna, subsequent encounter for closed fracture with routine healing: Secondary | ICD-10-CM

## 2020-12-14 DIAGNOSIS — F4310 Post-traumatic stress disorder, unspecified: Secondary | ICD-10-CM | POA: Diagnosis present

## 2020-12-14 DIAGNOSIS — I272 Pulmonary hypertension, unspecified: Secondary | ICD-10-CM | POA: Diagnosis present

## 2020-12-14 DIAGNOSIS — E039 Hypothyroidism, unspecified: Secondary | ICD-10-CM | POA: Diagnosis not present

## 2020-12-14 DIAGNOSIS — Z90710 Acquired absence of both cervix and uterus: Secondary | ICD-10-CM

## 2020-12-14 DIAGNOSIS — E871 Hypo-osmolality and hyponatremia: Secondary | ICD-10-CM | POA: Diagnosis present

## 2020-12-14 DIAGNOSIS — Z8249 Family history of ischemic heart disease and other diseases of the circulatory system: Secondary | ICD-10-CM | POA: Diagnosis not present

## 2020-12-14 DIAGNOSIS — E782 Mixed hyperlipidemia: Secondary | ICD-10-CM | POA: Diagnosis present

## 2020-12-14 DIAGNOSIS — Z9981 Dependence on supplemental oxygen: Secondary | ICD-10-CM | POA: Diagnosis not present

## 2020-12-14 DIAGNOSIS — Z7983 Long term (current) use of bisphosphonates: Secondary | ICD-10-CM

## 2020-12-14 DIAGNOSIS — Z825 Family history of asthma and other chronic lower respiratory diseases: Secondary | ICD-10-CM | POA: Diagnosis not present

## 2020-12-14 DIAGNOSIS — Z818 Family history of other mental and behavioral disorders: Secondary | ICD-10-CM

## 2020-12-14 DIAGNOSIS — I5033 Acute on chronic diastolic (congestive) heart failure: Secondary | ICD-10-CM | POA: Diagnosis not present

## 2020-12-14 DIAGNOSIS — R000 Tachycardia, unspecified: Secondary | ICD-10-CM | POA: Diagnosis not present

## 2020-12-14 DIAGNOSIS — Z823 Family history of stroke: Secondary | ICD-10-CM | POA: Diagnosis not present

## 2020-12-14 DIAGNOSIS — Z833 Family history of diabetes mellitus: Secondary | ICD-10-CM

## 2020-12-14 DIAGNOSIS — W19XXXD Unspecified fall, subsequent encounter: Secondary | ICD-10-CM | POA: Diagnosis present

## 2020-12-14 DIAGNOSIS — F3340 Major depressive disorder, recurrent, in remission, unspecified: Secondary | ICD-10-CM | POA: Diagnosis present

## 2020-12-14 DIAGNOSIS — J9811 Atelectasis: Secondary | ICD-10-CM | POA: Diagnosis not present

## 2020-12-14 DIAGNOSIS — J9611 Chronic respiratory failure with hypoxia: Secondary | ICD-10-CM | POA: Diagnosis present

## 2020-12-14 DIAGNOSIS — R0609 Other forms of dyspnea: Secondary | ICD-10-CM | POA: Diagnosis not present

## 2020-12-14 DIAGNOSIS — Z811 Family history of alcohol abuse and dependence: Secondary | ICD-10-CM

## 2020-12-14 DIAGNOSIS — J811 Chronic pulmonary edema: Secondary | ICD-10-CM | POA: Diagnosis not present

## 2020-12-14 DIAGNOSIS — J441 Chronic obstructive pulmonary disease with (acute) exacerbation: Secondary | ICD-10-CM | POA: Diagnosis present

## 2020-12-14 DIAGNOSIS — I110 Hypertensive heart disease with heart failure: Principal | ICD-10-CM | POA: Diagnosis present

## 2020-12-14 DIAGNOSIS — E119 Type 2 diabetes mellitus without complications: Secondary | ICD-10-CM | POA: Diagnosis not present

## 2020-12-14 DIAGNOSIS — Z7401 Bed confinement status: Secondary | ICD-10-CM | POA: Diagnosis not present

## 2020-12-14 DIAGNOSIS — Z79899 Other long term (current) drug therapy: Secondary | ICD-10-CM | POA: Diagnosis not present

## 2020-12-14 DIAGNOSIS — J40 Bronchitis, not specified as acute or chronic: Secondary | ICD-10-CM | POA: Diagnosis present

## 2020-12-14 DIAGNOSIS — Z79890 Hormone replacement therapy (postmenopausal): Secondary | ICD-10-CM

## 2020-12-14 DIAGNOSIS — I517 Cardiomegaly: Secondary | ICD-10-CM | POA: Diagnosis not present

## 2020-12-14 DIAGNOSIS — R52 Pain, unspecified: Secondary | ICD-10-CM

## 2020-12-14 DIAGNOSIS — Z743 Need for continuous supervision: Secondary | ICD-10-CM | POA: Diagnosis not present

## 2020-12-14 DIAGNOSIS — J90 Pleural effusion, not elsewhere classified: Secondary | ICD-10-CM | POA: Diagnosis not present

## 2020-12-14 DIAGNOSIS — Z20822 Contact with and (suspected) exposure to covid-19: Secondary | ICD-10-CM | POA: Diagnosis present

## 2020-12-14 DIAGNOSIS — S52023A Displaced fracture of olecranon process without intraarticular extension of unspecified ulna, initial encounter for closed fracture: Secondary | ICD-10-CM | POA: Diagnosis present

## 2020-12-14 DIAGNOSIS — R6889 Other general symptoms and signs: Secondary | ICD-10-CM | POA: Diagnosis not present

## 2020-12-14 LAB — CBC WITH DIFFERENTIAL/PLATELET
Abs Immature Granulocytes: 0.02 10*3/uL (ref 0.00–0.07)
Basophils Absolute: 0 10*3/uL (ref 0.0–0.1)
Basophils Relative: 0 %
Eosinophils Absolute: 0 10*3/uL (ref 0.0–0.5)
Eosinophils Relative: 0 %
HCT: 34.6 % — ABNORMAL LOW (ref 36.0–46.0)
Hemoglobin: 11.1 g/dL — ABNORMAL LOW (ref 12.0–15.0)
Immature Granulocytes: 0 %
Lymphocytes Relative: 4 %
Lymphs Abs: 0.2 10*3/uL — ABNORMAL LOW (ref 0.7–4.0)
MCH: 32.9 pg (ref 26.0–34.0)
MCHC: 32.1 g/dL (ref 30.0–36.0)
MCV: 102.7 fL — ABNORMAL HIGH (ref 80.0–100.0)
Monocytes Absolute: 0.5 10*3/uL (ref 0.1–1.0)
Monocytes Relative: 8 %
Neutro Abs: 5.4 10*3/uL (ref 1.7–7.7)
Neutrophils Relative %: 88 %
Platelets: 229 10*3/uL (ref 150–400)
RBC: 3.37 MIL/uL — ABNORMAL LOW (ref 3.87–5.11)
RDW: 12 % (ref 11.5–15.5)
WBC: 6.2 10*3/uL (ref 4.0–10.5)
nRBC: 0 % (ref 0.0–0.2)

## 2020-12-14 LAB — COMPREHENSIVE METABOLIC PANEL
ALT: 12 U/L (ref 0–44)
AST: 23 U/L (ref 15–41)
Albumin: 3.6 g/dL (ref 3.5–5.0)
Alkaline Phosphatase: 51 U/L (ref 38–126)
Anion gap: 4 — ABNORMAL LOW (ref 5–15)
BUN: 12 mg/dL (ref 8–23)
CO2: 38 mmol/L — ABNORMAL HIGH (ref 22–32)
Calcium: 8.8 mg/dL — ABNORMAL LOW (ref 8.9–10.3)
Chloride: 86 mmol/L — ABNORMAL LOW (ref 98–111)
Creatinine, Ser: 0.42 mg/dL — ABNORMAL LOW (ref 0.44–1.00)
GFR, Estimated: >60 mL/min (ref >60)
Glucose, Bld: 160 mg/dL — ABNORMAL HIGH (ref 70–99)
Potassium: 4 mmol/L (ref 3.5–5.1)
Sodium: 128 mmol/L — ABNORMAL LOW (ref 135–145)
Total Bilirubin: 0.6 mg/dL (ref 0.3–1.2)
Total Protein: 6.4 g/dL — ABNORMAL LOW (ref 6.5–8.1)

## 2020-12-14 LAB — RESP PANEL BY RT-PCR (FLU A&B, COVID) ARPGX2
Influenza A by PCR: NEGATIVE
Influenza B by PCR: NEGATIVE
SARS Coronavirus 2 by RT PCR: NEGATIVE

## 2020-12-14 LAB — BRAIN NATRIURETIC PEPTIDE: B Natriuretic Peptide: 304.1 pg/mL — ABNORMAL HIGH (ref 0.0–100.0)

## 2020-12-14 LAB — TSH: TSH: 2.724 u[IU]/mL (ref 0.350–4.500)

## 2020-12-14 LAB — TROPONIN I (HIGH SENSITIVITY)
Troponin I (High Sensitivity): 13 ng/L (ref <18)
Troponin I (High Sensitivity): 14 ng/L (ref <18)

## 2020-12-14 LAB — PROCALCITONIN: Procalcitonin: <0.1 ng/mL

## 2020-12-14 LAB — D-DIMER, QUANTITATIVE: D-Dimer, Quant: 0.84 ug/mL-FEU — ABNORMAL HIGH (ref 0.00–0.50)

## 2020-12-14 MED ORDER — MOMETASONE FURO-FORMOTEROL FUM 200-5 MCG/ACT IN AERO
2.0000 | INHALATION_SPRAY | Freq: Two times a day (BID) | RESPIRATORY_TRACT | Status: DC
  Administered 2020-12-15 – 2020-12-17 (×6): 2 via RESPIRATORY_TRACT
  Filled 2020-12-14: qty 8.8

## 2020-12-14 MED ORDER — ASPIRIN EC 81 MG PO TBEC
81.0000 mg | DELAYED_RELEASE_TABLET | Freq: Every day | ORAL | Status: DC
  Administered 2020-12-15 – 2020-12-17 (×3): 81 mg via ORAL
  Filled 2020-12-14 (×3): qty 1

## 2020-12-14 MED ORDER — ROSUVASTATIN CALCIUM 10 MG PO TABS
20.0000 mg | ORAL_TABLET | Freq: Every day | ORAL | Status: DC
  Administered 2020-12-15 – 2020-12-16 (×3): 20 mg via ORAL
  Filled 2020-12-14 (×3): qty 2

## 2020-12-14 MED ORDER — METHYLPREDNISOLONE SODIUM SUCC 125 MG IJ SOLR
125.0000 mg | Freq: Once | INTRAMUSCULAR | Status: AC
  Administered 2020-12-14: 125 mg via INTRAVENOUS
  Filled 2020-12-14: qty 2

## 2020-12-14 MED ORDER — VITAMIN B-12 1000 MCG PO TABS
1000.0000 ug | ORAL_TABLET | Freq: Every day | ORAL | Status: DC
  Administered 2020-12-15 – 2020-12-16 (×2): 1000 ug via ORAL
  Filled 2020-12-14 (×3): qty 1

## 2020-12-14 MED ORDER — ENOXAPARIN SODIUM 40 MG/0.4ML IJ SOSY
40.0000 mg | PREFILLED_SYRINGE | INTRAMUSCULAR | Status: DC
  Administered 2020-12-15 – 2020-12-16 (×3): 40 mg via SUBCUTANEOUS
  Filled 2020-12-14 (×3): qty 0.4

## 2020-12-14 MED ORDER — ALBUTEROL SULFATE (2.5 MG/3ML) 0.083% IN NEBU
2.5000 mg | INHALATION_SOLUTION | RESPIRATORY_TRACT | Status: DC | PRN
  Filled 2020-12-14: qty 3

## 2020-12-14 MED ORDER — ACETAMINOPHEN 650 MG RE SUPP: 650.0000 mg | Freq: Four times a day (QID) | RECTAL | Status: DC | PRN

## 2020-12-14 MED ORDER — HYDROCODONE-ACETAMINOPHEN 5-325 MG PO TABS
1.0000 | ORAL_TABLET | ORAL | Status: AC | PRN
  Administered 2020-12-15: 1 via ORAL
  Filled 2020-12-14: qty 1

## 2020-12-14 MED ORDER — FUROSEMIDE 10 MG/ML IJ SOLN
60.0000 mg | Freq: Once | INTRAMUSCULAR | Status: AC
  Administered 2020-12-14: 60 mg via INTRAVENOUS
  Filled 2020-12-14: qty 8

## 2020-12-14 MED ORDER — LORAZEPAM 2 MG/ML IJ SOLN
1.0000 mg | Freq: Once | INTRAMUSCULAR | Status: AC
  Administered 2020-12-14: 1 mg via INTRAVENOUS
  Filled 2020-12-14: qty 1

## 2020-12-14 MED ORDER — FUROSEMIDE 10 MG/ML IJ SOLN
40.0000 mg | Freq: Two times a day (BID) | INTRAMUSCULAR | Status: DC
  Administered 2020-12-15: 40 mg via INTRAVENOUS
  Filled 2020-12-14: qty 4

## 2020-12-14 MED ORDER — TRAMADOL HCL 50 MG PO TABS
50.0000 mg | ORAL_TABLET | Freq: Two times a day (BID) | ORAL | Status: AC | PRN
  Administered 2020-12-15 (×2): 50 mg via ORAL
  Filled 2020-12-14 (×2): qty 1

## 2020-12-14 MED ORDER — FLUTICASONE PROPIONATE 50 MCG/ACT NA SUSP
2.0000 | Freq: Every day | NASAL | Status: DC
  Administered 2020-12-15 – 2020-12-17 (×3): 2 via NASAL
  Filled 2020-12-14: qty 16

## 2020-12-14 MED ORDER — IOHEXOL 350 MG/ML SOLN
75.0000 mL | Freq: Once | INTRAVENOUS | Status: AC | PRN
  Administered 2020-12-14: 75 mL via INTRAVENOUS

## 2020-12-14 MED ORDER — GUAIFENESIN ER 600 MG PO TB12
600.0000 mg | ORAL_TABLET | Freq: Two times a day (BID) | ORAL | Status: DC | PRN
  Administered 2020-12-15: 600 mg via ORAL
  Filled 2020-12-14: qty 1

## 2020-12-14 MED ORDER — FLUOXETINE HCL 20 MG PO CAPS
40.0000 mg | ORAL_CAPSULE | Freq: Every day | ORAL | Status: DC
  Administered 2020-12-15 – 2020-12-17 (×3): 40 mg via ORAL
  Filled 2020-12-14 (×3): qty 2

## 2020-12-14 MED ORDER — LEVOTHYROXINE SODIUM 88 MCG PO TABS
88.0000 ug | ORAL_TABLET | Freq: Every day | ORAL | Status: DC
  Administered 2020-12-15 – 2020-12-17 (×3): 88 ug via ORAL
  Filled 2020-12-14 (×3): qty 1

## 2020-12-14 MED ORDER — IPRATROPIUM-ALBUTEROL 0.5-2.5 (3) MG/3ML IN SOLN
3.0000 mL | Freq: Three times a day (TID) | RESPIRATORY_TRACT | Status: DC
  Administered 2020-12-15 – 2020-12-17 (×8): 3 mL via RESPIRATORY_TRACT
  Filled 2020-12-14 (×8): qty 3

## 2020-12-14 MED ORDER — IPRATROPIUM-ALBUTEROL 0.5-2.5 (3) MG/3ML IN SOLN
3.0000 mL | Freq: Once | RESPIRATORY_TRACT | Status: AC
  Administered 2020-12-14: 3 mL via RESPIRATORY_TRACT
  Filled 2020-12-14: qty 3

## 2020-12-14 MED ORDER — ONDANSETRON HCL 4 MG/2ML IJ SOLN
4.0000 mg | Freq: Four times a day (QID) | INTRAMUSCULAR | Status: DC | PRN
Start: 2020-12-14 — End: 2020-12-17

## 2020-12-14 MED ORDER — ONDANSETRON HCL 4 MG PO TABS
4.0000 mg | ORAL_TABLET | Freq: Four times a day (QID) | ORAL | Status: DC | PRN
  Administered 2020-12-16: 4 mg via ORAL
  Filled 2020-12-14: qty 1

## 2020-12-14 MED ORDER — IPRATROPIUM-ALBUTEROL 0.5-2.5 (3) MG/3ML IN SOLN
6.0000 mL | Freq: Once | RESPIRATORY_TRACT | Status: AC
  Administered 2020-12-14: 6 mL via RESPIRATORY_TRACT
  Filled 2020-12-14: qty 3

## 2020-12-14 MED ORDER — ACETAMINOPHEN 325 MG PO TABS
650.0000 mg | ORAL_TABLET | Freq: Four times a day (QID) | ORAL | Status: DC | PRN
  Administered 2020-12-16 – 2020-12-17 (×3): 650 mg via ORAL
  Filled 2020-12-14 (×3): qty 2

## 2020-12-14 MED ORDER — HYDRALAZINE HCL 10 MG PO TABS
10.0000 mg | ORAL_TABLET | Freq: Four times a day (QID) | ORAL | Status: AC | PRN
  Filled 2020-12-14: qty 1

## 2020-12-14 MED ORDER — LORAZEPAM 1 MG PO TABS
1.0000 mg | ORAL_TABLET | Freq: Every evening | ORAL | Status: DC | PRN
  Administered 2020-12-15: 1 mg via ORAL
  Filled 2020-12-14: qty 1

## 2020-12-14 NOTE — ED Triage Notes (Signed)
Patient brought in via ems from home. Patient developed SOB last night. Hx of COPD. Has cough. No fevers

## 2020-12-14 NOTE — ED Notes (Signed)
ED Provider at bedside. 
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
## 2020-12-14 NOTE — ED Provider Notes (Signed)
[facility] Emergency Department Provider Note ____________________________________________   Event Date/Time   First MD Initiated Contact with Patient 12/14/20 1615     (approximate)  I have reviewed the triage vital signs and the nursing notes.  HISTORY  Chief Complaint Shortness of Breath   HPI [name] is a 75 y.o. femalewho presents to the ED for evaluation of shortness of breath and nonproductive cough.  Chart review indicates history of COPD on 2 L O2, hypothyroidism, HLD and recent left elbow and right knee fractures about 4 weeks ago, she was discharged to SNF, and just got home 2 to 3 days ago.  She was at home alone but has a caregiver that comes in during the daytime.  Patient reports feeling okay she went home 2 days ago, but woke up this morning feeling quite short of breath.  She reports increased cough without increased sputum production.  Denies fevers, chest pain or pressure, abdominal pain, emesis, stool changes, syncopal episodes. Does report associated nausea and poor appetite.   Past Medical History:  Diagnosis Date   Anxiety    Chest pain    CHF (congestive heart failure) (HCC)    COPD (chronic obstructive pulmonary disease) (HCC)    Depression    Diabetes mellitus without complication (HCC)    Fibromyalgia    Graves disease    Hypertension    IBS (irritable bowel syndrome)    PTSD (post-traumatic stress disorder)    Sinus problem    Spinal stenosis     Patient Active Problem List   Diagnosis Date Noted   Patellar fracture 11/18/2020   Olecranon fracture 11/18/2020   Major depressive disorder, recurrent, in remission, unspecified (HCC) 04/23/2020   Pulmonary HTN (HCC)    Hypothyroidism, acquired 04/25/2019   Acute metabolic encephalopathy 03/19/2019   Confusion 03/17/2019   AMS (altered mental status) 03/12/2019   Acute encephalopathy 03/11/2019   Hypothyroidism 03/11/2019   Altered mental status, unspecified  03/11/2019   Medicare annual wellness visit, subsequent 06/13/2018   PSVT (paroxysmal supraventricular tachycardia) (HCC) 10/05/2017   Age-related osteoporosis without current pathological fracture 09/15/2017   Chronic obstructive pulmonary disease (HCC) 05/21/2017   Shortness of breath 04/05/2017   Bilateral carotid artery stenosis 03/17/2017   Hyperlipidemia, mixed 03/17/2017   Orthostatic hypotension 01/12/2017   Bronchitis 01/04/2017   Angina pectoris (HCC) 06/13/2015   UTI (lower urinary tract infection) 06/13/2015   Degenerative disc disease, lumbar 10/18/2014   Sacroiliac joint dysfunction 10/18/2014   Facet syndrome, lumbar 10/18/2014   Cervical facet joint syndrome 10/18/2014   Bilateral occipital neuralgia 10/18/2014   Migraine 10/18/2014   Neurosis, posttraumatic 10/08/2014   Posttraumatic stress disorder 10/08/2014   Spinal stenosis    PTSD (post-traumatic stress disorder)     Past Surgical History:  Procedure Laterality Date   ABDOMINAL HYSTERECTOMY     CAST APPLICATION  11/19/2020   Procedure: Application of long-leg hinged brace right lower extremity.;  Surgeon: Christena Flake, MD;  Location: [facility] ORS;  Service: Orthopedics;;   ESOPHAGOGASTRODUODENOSCOPY (EGD) WITH PROPOFOL N/A 01/10/2015   Procedure: ESOPHAGOGASTRODUODENOSCOPY (EGD) WITH PROPOFOL;  Surgeon: Wallace Cullens, MD;  Location: [facility] ENDOSCOPY;  Service: Gastroenterology;  Laterality: N/A;   ORIF ELBOW FRACTURE Left 11/19/2020   Procedure: OPEN REDUCTION INTERNAL FIXATION (ORIF) ELBOW/OLECRANON FRACTURE;  Surgeon: Christena Flake, MD;  Location: [facility] ORS;  Service: Orthopedics;  Laterality: Left;   RECTAL SURGERY     RIGHT/LEFT HEART CATH AND CORONARY ANGIOGRAPHY Bilateral 04/08/2020  Procedure: RIGHT/LEFT HEART CATH AND CORONARY ANGIOGRAPHY;  Surgeon: Iran Ouch, MD;  Location: [facility] INVASIVE CV LAB;  Service: Cardiovascular;  Laterality: Bilateral;   SAVORY DILATION N/A 01/10/2015   Procedure: SAVORY DILATION;   Surgeon: Wallace Cullens, MD;  Location: [facility] ENDOSCOPY;  Service: Gastroenterology;  Laterality: N/A;   toenail removal Bilateral    great toes   TONSILLECTOMY AND ADENOIDECTOMY      Prior to Admission medications   Medication Sig Start Date End Date Taking? Authorizing Provider  acetaminophen (TYLENOL) 500 MG tablet Take 1,000 mg by mouth in the morning, at noon, and at bedtime.    [provider]  albuterol (PROAIR HFA) 108 (90 Base) MCG/ACT inhaler INHALE 2 PUFFS INTO LUNGS EVERY 6 HOURS AS NEEDED FOR WHEEZING OR SHORTNESS OF BREATH 07/05/20   Lyndon Code, MD  albuterol (VENTOLIN HFA) 108 (90 Base) MCG/ACT inhaler Inhale 2 puffs into the lungs every 6 (six) hours as needed for wheezing or shortness of breath. 07/05/20   Lyndon Code, MD  alendronate (FOSAMAX) 70 MG tablet Take 70 mg by mouth every Tuesday.  01/11/19   [provider]  aspirin EC 81 MG tablet Take 81 mg by mouth daily. Swallow whole.    [provider]  Azelastine HCl 137 MCG/SPRAY SOLN Place 2 sprays into both nostrils in the morning and at bedtime.  09/06/19   [provider]  Cyanocobalamin (VITAMIN B-12) 1000 MCG/15ML LIQD Take 1,200 mcg by mouth daily.    [provider]  enoxaparin (LOVENOX) 40 MG/0.4ML injection Inject 0.4 mLs (40 mg total) into the skin daily. 11/21/20   Anson Oregon, PA-C  ergocalciferol (VITAMIN D2) 1.25 MG (50000 UT) capsule Take 1 capsule by mouth every 7 (seven) days. 01/16/20   [provider]  FLUoxetine (PROZAC) 20 MG capsule Take 2 capsules (40 mg total) by mouth daily. 10/29/20   Lyndon Code, MD  fluticasone [facility]) 50 MCG/ACT nasal spray Place 2 sprays into both nostrils in the morning and at bedtime.  03/13/19   [provider]  guaifenesin (HUMIBID E) 400 MG TABS tablet Take 800 mg by mouth every 6 (six) hours as needed (congestion/cough).     [provider]  HYDROcodone-acetaminophen (NORCO/VICODIN) 5-325 MG tablet  Take 1-2 tablets by mouth every 4 (four) hours as needed for moderate pain. 11/21/20   Anson Oregon, PA-C  hydrocortisone 2.5 % cream Place 1 application rectally 2 (two) times daily as needed (itching/hemorroids).     [provider]  ipratropium-albuterol (DUONEB) 0.5-2.5 (3) MG/3ML SOLN One vial 3 x  aday for copd with hypoxia 02/27/20   Lyndon Code, MD  levothyroxine (SYNTHROID) 88 MCG tablet Take 88 mcg by mouth daily before breakfast. Take 30 minutes before food and with a full glass of water    [provider]  LORazepam (ATIVAN) 1 MG tablet Take 1 tablet (1 mg total) by mouth at bedtime. 10/29/20   Lyndon Code, MD  Multiple Vitamin (MULTIVITAMIN WITH MINERALS) TABS tablet Take 1 tablet by mouth daily. Multivitamin for Seniors    [provider]  ondansetron (ZOFRAN) 4 MG tablet Take 1 tablet (4 mg total) by mouth every 6 (six) hours as needed for nausea. 11/21/20   Anson Oregon, PA-C  rosuvastatin (CRESTOR) 20 MG tablet Take 1 tablet (20 mg total) by mouth daily. 03/12/20   Lyndon Code, MD  SYMBICORT 160-4.5 MCG/ACT inhaler Inhale 2 puffs into the lungs  2 (two) times daily. 02/20/19   [provider]  tiotropium (SPIRIVA) 18 MCG inhalation capsule Place 18 mcg into inhaler and inhale in the morning and at bedtime.    [provider]  traMADol (ULTRAM) 50 MG tablet Take one tab po tid for pain Patient taking differently: Take 50 mg by mouth 2 (two) times daily as needed. Upon awakening for body stiffness and pain. May take additional dose if needed later in day. 11/07/20   Lyndon Code, MD    Allergies Celecoxib and Pregabalin  Family History  Problem Relation Age of Onset   Alcohol abuse Mother    Depression Mother    Varicose Veins Mother    Alcohol abuse Father    Depression Father    Alcohol abuse Sister    Asthma Sister    COPD Sister    Depression Sister    Heart disease Sister    Hypertension Sister    Diabetes  Sister    Depression Maternal Grandmother    Diabetes Paternal Grandmother    Stroke Paternal Grandmother     Social History Social History   Tobacco Use   Smoking status: Former    Packs/day: 0.50    Years: 25.00    Pack years: 12.50    Types: Cigarettes    Quit date: 06/03/2016    Years since quitting: 4.5   Smokeless tobacco: Never  Vaping Use   Vaping Use: Never used  Substance Use Topics   Alcohol use: No    Alcohol/week: 0.0 standard drinks   Drug use: No    Review of Systems  Constitutional: No fever/chills Eyes: No visual changes. ENT: No sore throat. Cardiovascular: Denies chest pain. Respiratory: Positive for cough and shortness of breath. Gastrointestinal: No abdominal pain.  No nausea, no vomiting.  No diarrhea.  No constipation. Genitourinary: Negative for dysuria. Musculoskeletal: Negative for back pain. Skin: Negative for rash. Neurological: Negative for headaches, focal weakness or numbness.  ____________________________________________   PHYSICAL EXAM:  VITAL SIGNS: Vitals:   12/14/20 1800 12/14/20 1918  BP: (!) 141/62 (!) 178/74  Pulse: (!) 102 (!) 104  Resp: (!) 25 (!) 21  Temp:    SpO2: 95% 93%      Constitutional: Alert and oriented.  Appears uncomfortable, but no distress.  Sitting upright in bed with pursed lip breathing, speaking only in phrases. Eyes: Conjunctivae are normal. PERRL. EOMI. Head: Atraumatic. Nose: No congestion/rhinnorhea. Mouth/Throat: Mucous membranes are moist.  Oropharynx non-erythematous. Neck: No stridor. No cervical spine tenderness to palpation. Cardiovascular: Normal rate, regular rhythm. Grossly normal heart sounds.  Good peripheral circulation. Respiratory: Tachypneic to the upper 20s, no retractions.  Diffuse expiratory wheezes and poor air movement throughout.  No focal features. Gastrointestinal: Soft , nondistended, nontender to palpation. No CVA tenderness. Musculoskeletal:   No joint effusions.   Hinged braces to her left elbow and right leg. Neurologic:  Normal speech and language. No gross focal neurologic deficits are appreciated.  Skin:  Skin is warm, dry and intact. No rash noted. Psychiatric: Mood and affect are normal. Speech and behavior are normal.  ____________________________________________   LABS (all labs ordered are listed, but only abnormal results are displayed)  Labs Reviewed  CBC WITH DIFFERENTIAL/PLATELET - Abnormal; Notable for the following components:      Result Value   RBC 3.37 (*)    Hemoglobin 11.1 (*)    HCT 34.6 (*)    MCV 102.7 (*)    Lymphs Abs 0.2 (*)  All other components within normal limits  COMPREHENSIVE METABOLIC PANEL - Abnormal; Notable for the following components:   Sodium 128 (*)    Chloride 86 (*)    CO2 38 (*)    Glucose, Bld 160 (*)    Creatinine, Ser 0.42 (*)    Calcium 8.8 (*)    Total Protein 6.4 (*)    Anion gap 4 (*)    All other components within normal limits  D-DIMER, QUANTITATIVE - Abnormal; Notable for the following components:   D-Dimer, Quant 0.84 (*)    All other components within normal limits  RESP PANEL BY RT-PCR (FLU A&B, COVID) ARPGX2  BRAIN NATRIURETIC PEPTIDE  TROPONIN I (HIGH SENSITIVITY)  TROPONIN I (HIGH SENSITIVITY)   ____________________________________________  12 Lead EKG   ____________________________________________  RADIOLOGY  ED MD interpretation: 1 view CXR reviewed by me with evidence of pulm vascular congestion and small pleural effusions CTA chest reviewed by me with bilateral pleural effusions without evidence of acute PE  Official radiology report(s): DG Chest 1 View  Result Date: 12/14/2020 CLINICAL DATA:  Shortness of breath.  History of COPD and CHF. EXAM: CHEST  1 VIEW COMPARISON:  November 18, 2020 FINDINGS: Mild cardiomegaly. The hila and mediastinum are unchanged. No pneumothorax. Small layering pleural effusions with underlying atelectasis and mild pulmonary edema  identified. No other acute abnormalities. IMPRESSION: Mild cardiomegaly, small layering effusions, and mild pulmonary edema. Electronically Signed   By: Gerome Sam III M.D   On: 12/14/2020 17:16   CT Angio Chest PE W and/or Wo Contrast  Result Date: 12/14/2020 CLINICAL DATA:  copd exacerbation. SOB tachy despite treatment, elevated dimer, immobilized. eval PE EXAM: CT ANGIOGRAPHY CHEST WITH CONTRAST TECHNIQUE: Multidetector CT imaging of the chest was performed using the standard protocol during bolus administration of intravenous contrast. Multiplanar CT image reconstructions and MIPs were obtained to evaluate the vascular anatomy. CONTRAST:  34mL OMNIPAQUE IOHEXOL 350 MG/ML SOLN COMPARISON:  Chest x-ray 12/14/2020, CT chest 03/07/2020 FINDINGS: Cardiovascular: Satisfactory opacification of the pulmonary arteries to the segmental level. No evidence of pulmonary embolism. Enlarged main pulmonary artery. Normal heart size. No significant pericardial effusion. The thoracic aorta is normal in caliber. At least mild atherosclerotic plaque of the thoracic aorta. Four-vessel coronary artery calcifications. Mediastinum/Nodes: No enlarged mediastinal, hilar, or axillary lymph nodes. Thyroid gland, trachea, and esophagus demonstrate no significant findings. Lungs/Pleura: Expiratory phase of respiration. Diffuse paraseptal and centrilobular emphysematous changes. Bilateral lower lobe passive atelectasis. Bilateral trace to small volume pleural effusions, right greater than left. No pulmonary nodule. No pulmonary mass. No definite focal consolidation. No pneumothorax. Upper Abdomen: No acute abnormality. Musculoskeletal: No chest wall abnormality. No suspicious lytic or blastic osseous lesions. No acute displaced fracture. Multilevel degenerative changes of the spine. Review of the MIP images confirms the above findings. IMPRESSION: 1. No pulmonary embolus. 2. Bilateral trace to small volume pleural effusions, right  greater than left. 3. Enlarged main pulmonary artery suggestive of pulmonary hypertension. 4. Aortic Atherosclerosis (ICD10-I70.0) and Emphysema (ICD10-J43.9). Including four-vessel coronary artery calcifications. Electronically Signed   By: Tish Frederickson M.D.   On: 12/14/2020 19:24    ____________________________________________   PROCEDURES and INTERVENTIONS  Procedure(s) performed (including Critical Care):  .1-3 Lead EKG Interpretation  Date/Time: 12/14/2020 7:45 PM Performed by: Delton Prairie, MD Authorized by: Delton Prairie, MD     Interpretation: abnormal     ECG rate:  106   ECG rate assessment: tachycardic     Rhythm: sinus tachycardia  Ectopy: none     Conduction: normal    Medications  albuterol (PROVENTIL) (2.5 MG/3ML) 0.083% nebulizer solution 2.5 mg (has no administration in time range)  furosemide (LASIX) injection 60 mg (has no administration in time range)  ipratropium-albuterol (DUONEB) 0.5-2.5 (3) MG/3ML nebulizer solution 6 mL (6 mLs Nebulization Given 12/14/20 1706)  methylPREDNISolone sodium succinate (SOLU-MEDROL) 125 mg/2 mL injection 125 mg (125 mg Intravenous Given 12/14/20 1706)  ipratropium-albuterol (DUONEB) 0.5-2.5 (3) MG/3ML nebulizer solution 3 mL (3 mLs Nebulization Given 12/14/20 1811)  LORazepam (ATIVAN) injection 1 mg (1 mg Intravenous Given 12/14/20 1811)  iohexol (OMNIPAQUE) 350 MG/ML injection 75 mL (75 mLs Intravenous Contrast Given 12/14/20 1831)    ____________________________________________   MDM / ED COURSE   75 year old woman with history of COPD, pulmonary hypertension, and recent immobility presents with shortness of breath and tachycardia, with evidence of COPD exacerbation but no evidence of acute PE, and requiring medical observation admission.  Not hypoxic on her home O2, but tachycardic and tachypneic.  She has orthopnea without evidence of gross volume overload or lower extremity edema.  Wheezing and decreased air movement  throughout consistent with COPD exacerbation.  Provided steroids and breathing treatments with some improvement of her pulmonary aeration and clinical status, but she has residual tachycardia and tachypnea.  Considering her immobility associated with recent fractures, D-dimer was sent and returned positive, therefore CTA chest obtained and without evidence of acute PE.  Does demonstrate stigmata of pulmonary hypertension, likely contributing to her respiratory status.  BNP is pending.  We will initiate diuresis with a single dose of Lasix and discuss with hospitalist for admission.  Clinical Course as of 12/14/20 1945  Sat Dec 14, 2020  1755 Reassessed.  Patient reports some residual shortness of breath, examination of her lungs reveals improved airflow, but persistent wheezing.  We discussed CT scan of her chest to assess for PE considering her immobility and elevated D-dimer, she is in agreement.  He was requesting anxiolysis [DS]  1943 Reassessed.  Patient still tachypneic and tachycardic.  We discussed admission to the hospital and she is agreeable. [DS]    Clinical Course User Index [DS] Delton PrairieSmith, Jackson Fetters, MD    ____________________________________________   FINAL CLINICAL IMPRESSION(S) / ED DIAGNOSES  Final diagnoses:  COPD exacerbation (HCC)  Shortness of breath  Sinus tachycardia  Pulmonary hypertension [facility](HCC)     ED Discharge Orders     None        Tobe Kervin   Note:  This document was prepared using Dragon voice recognition software and may include unintentional dictation errors.    Delton PrairieSmith, Natasha Paulson, MD 12/14/20 [phone]

## 2020-12-14 NOTE — ED Notes (Signed)
Admitting at bedside 

## 2020-12-15 ENCOUNTER — Observation Stay (HOSPITAL_COMMUNITY)
Admit: 2020-12-15 | Discharge: 2020-12-15 | Disposition: A | Discharge status: Home / Self Care | Payer: Medicare Other | Attending: Internal Medicine | Admitting: Internal Medicine

## 2020-12-15 DIAGNOSIS — E039 Hypothyroidism, unspecified: Secondary | ICD-10-CM | POA: Diagnosis present

## 2020-12-15 DIAGNOSIS — Z20822 Contact with and (suspected) exposure to covid-19: Secondary | ICD-10-CM | POA: Diagnosis present

## 2020-12-15 DIAGNOSIS — S52025D Nondisplaced fracture of olecranon process without intraarticular extension of left ulna, subsequent encounter for closed fracture with routine healing: Secondary | ICD-10-CM | POA: Diagnosis not present

## 2020-12-15 DIAGNOSIS — J441 Chronic obstructive pulmonary disease with (acute) exacerbation: Secondary | ICD-10-CM | POA: Diagnosis present

## 2020-12-15 DIAGNOSIS — Z833 Family history of diabetes mellitus: Secondary | ICD-10-CM | POA: Diagnosis not present

## 2020-12-15 DIAGNOSIS — F4310 Post-traumatic stress disorder, unspecified: Secondary | ICD-10-CM | POA: Diagnosis present

## 2020-12-15 DIAGNOSIS — W19XXXD Unspecified fall, subsequent encounter: Secondary | ICD-10-CM | POA: Diagnosis present

## 2020-12-15 DIAGNOSIS — Z87891 Personal history of nicotine dependence: Secondary | ICD-10-CM | POA: Diagnosis not present

## 2020-12-15 DIAGNOSIS — E119 Type 2 diabetes mellitus without complications: Secondary | ICD-10-CM | POA: Diagnosis present

## 2020-12-15 DIAGNOSIS — J9611 Chronic respiratory failure with hypoxia: Secondary | ICD-10-CM | POA: Diagnosis present

## 2020-12-15 DIAGNOSIS — Z79899 Other long term (current) drug therapy: Secondary | ICD-10-CM | POA: Diagnosis not present

## 2020-12-15 DIAGNOSIS — Z825 Family history of asthma and other chronic lower respiratory diseases: Secondary | ICD-10-CM | POA: Diagnosis not present

## 2020-12-15 DIAGNOSIS — F3340 Major depressive disorder, recurrent, in remission, unspecified: Secondary | ICD-10-CM | POA: Diagnosis present

## 2020-12-15 DIAGNOSIS — R0609 Other forms of dyspnea: Secondary | ICD-10-CM | POA: Diagnosis not present

## 2020-12-15 DIAGNOSIS — Z818 Family history of other mental and behavioral disorders: Secondary | ICD-10-CM | POA: Diagnosis not present

## 2020-12-15 DIAGNOSIS — Z811 Family history of alcohol abuse and dependence: Secondary | ICD-10-CM | POA: Diagnosis not present

## 2020-12-15 DIAGNOSIS — R0602 Shortness of breath: Secondary | ICD-10-CM | POA: Diagnosis present

## 2020-12-15 DIAGNOSIS — E871 Hypo-osmolality and hyponatremia: Secondary | ICD-10-CM | POA: Diagnosis present

## 2020-12-15 DIAGNOSIS — Z9981 Dependence on supplemental oxygen: Secondary | ICD-10-CM | POA: Diagnosis not present

## 2020-12-15 DIAGNOSIS — I110 Hypertensive heart disease with heart failure: Secondary | ICD-10-CM | POA: Diagnosis present

## 2020-12-15 DIAGNOSIS — Z823 Family history of stroke: Secondary | ICD-10-CM | POA: Diagnosis not present

## 2020-12-15 DIAGNOSIS — E782 Mixed hyperlipidemia: Secondary | ICD-10-CM | POA: Diagnosis present

## 2020-12-15 DIAGNOSIS — Z90710 Acquired absence of both cervix and uterus: Secondary | ICD-10-CM | POA: Diagnosis not present

## 2020-12-15 DIAGNOSIS — Z8249 Family history of ischemic heart disease and other diseases of the circulatory system: Secondary | ICD-10-CM | POA: Diagnosis not present

## 2020-12-15 DIAGNOSIS — I272 Pulmonary hypertension, unspecified: Secondary | ICD-10-CM | POA: Diagnosis present

## 2020-12-15 DIAGNOSIS — I5033 Acute on chronic diastolic (congestive) heart failure: Secondary | ICD-10-CM | POA: Diagnosis present

## 2020-12-15 DIAGNOSIS — M797 Fibromyalgia: Secondary | ICD-10-CM | POA: Diagnosis present

## 2020-12-15 LAB — ECHOCARDIOGRAM COMPLETE
AR max vel: 2.04 cm2
AV Peak grad: 9.7 mmHg
Ao pk vel: 1.56 m/s
Area-P 1/2: 4.41 cm2
Height: 66 in
S' Lateral: 2.53 cm
Weight: 2271.62 oz

## 2020-12-15 LAB — BASIC METABOLIC PANEL
Anion gap: 11 (ref 5–15)
BUN: 11 mg/dL (ref 8–23)
CO2: 37 mmol/L — ABNORMAL HIGH (ref 22–32)
Calcium: 9.4 mg/dL (ref 8.9–10.3)
Chloride: 82 mmol/L — ABNORMAL LOW (ref 98–111)
Creatinine, Ser: 0.56 mg/dL (ref 0.44–1.00)
GFR, Estimated: >60 mL/min (ref >60)
Glucose, Bld: 127 mg/dL — ABNORMAL HIGH (ref 70–99)
Glucose: 127
Potassium: 3.9 mmol/L (ref 3.5–5.1)
Sodium: 130 mmol/L — ABNORMAL LOW (ref 135–145)

## 2020-12-15 LAB — CBC
HCT: 38.6 % (ref 36.0–46.0)
Hemoglobin: 12.1 g/dL (ref 12.0–15.0)
MCH: 31.9 pg (ref 26.0–34.0)
MCHC: 31.3 g/dL (ref 30.0–36.0)
MCV: 101.8 fL — ABNORMAL HIGH (ref 80.0–100.0)
Platelets: 228 10*3/uL (ref 150–400)
RBC: 3.79 MIL/uL — ABNORMAL LOW (ref 3.87–5.11)
RDW: 11.9 % (ref 11.5–15.5)
WBC: 2.7 10*3/uL — ABNORMAL LOW (ref 4.0–10.5)
nRBC: 0 % (ref 0.0–0.2)

## 2020-12-15 LAB — PROCALCITONIN: Procalcitonin: <0.1 ng/mL

## 2020-12-15 MED ORDER — ALBUTEROL SULFATE HFA 108 (90 BASE) MCG/ACT IN AERS: 1.0000 | INHALATION_SPRAY | RESPIRATORY_TRACT | Status: DC | PRN

## 2020-12-15 MED ORDER — ADULT MULTIVITAMIN W/MINERALS CH
1.0000 | ORAL_TABLET | Freq: Every day | ORAL | Status: DC
  Administered 2020-12-16: 1 via ORAL
  Filled 2020-12-15 (×2): qty 1

## 2020-12-15 MED ORDER — ORAL CARE MOUTH RINSE
15.0000 mL | Freq: Two times a day (BID) | OROMUCOSAL | Status: DC
  Administered 2020-12-15 – 2020-12-17 (×5): 15 mL via OROMUCOSAL

## 2020-12-15 MED ORDER — TIOTROPIUM BROMIDE MONOHYDRATE 18 MCG IN CAPS
18.0000 ug | ORAL_CAPSULE | Freq: Every day | RESPIRATORY_TRACT | Status: DC
  Administered 2020-12-15 – 2020-12-17 (×3): 18 ug via RESPIRATORY_TRACT
  Filled 2020-12-15: qty 5

## 2020-12-15 MED ORDER — VITAMIN D 25 MCG (1000 UNIT) PO TABS
2000.0000 [IU] | ORAL_TABLET | Freq: Every day | ORAL | Status: DC
  Administered 2020-12-15 – 2020-12-16 (×2): 2000 [IU] via ORAL
  Filled 2020-12-15 (×3): qty 2

## 2020-12-15 MED ORDER — HYDROXYZINE HCL 10 MG PO TABS
10.0000 mg | ORAL_TABLET | Freq: Three times a day (TID) | ORAL | Status: DC | PRN
  Filled 2020-12-15: qty 1

## 2020-12-15 MED ORDER — FUROSEMIDE 10 MG/ML IJ SOLN
40.0000 mg | Freq: Every day | INTRAMUSCULAR | Status: DC
  Administered 2020-12-16: 40 mg via INTRAVENOUS
  Filled 2020-12-15: qty 4

## 2020-12-15 NOTE — Evaluation (Signed)
Physical Therapy Evaluation Patient Details Name: [name] MRN: [id number] DOB: 11/23/1945 Today's Date: 12/15/2020   History of Present Illness  Pt is a 75 y/o F admitted on 12/14/20 with c/c of SOB suspect 2/2 heart failure exacerbation. PMH: anxiety, HLD, hypothyroid, COPD, on 2L O2 at baseline, depression, tobacco abuse, DM, fibromyalgia, graves disease, IBS, HTN, spinal stenosis  Clinical Impression  Pt seen for PT evaluation with pt unable to accurately report RLE weight bearing restrictions as pt states she was walking prior to admission but also can't put weight on her RLE. Per chart pt is WBAT RLE in brace locked in extension. Pt requires min assist for bed mobility & min assist for sit>stand from elevated EOB & stand pivot to recliner with HHA. Pt does require MAX assist for sit>stand from low recliner seat and cuing to slide RLE underneath her BOS when doing so. After completing transfer from low recliner pt too fatigued to attempt ambulation & requests to return to sitting. Pt would benefit from continued skilled PT treatment to progress gait with LRAD & for strengthening, endurance training, and balance retraining.     Follow Up Recommendations SNF    Equipment Recommendations   (TBD in next venue)    Recommendations for Other Services       Precautions / Restrictions Precautions Precautions: Fall Restrictions Weight Bearing Restrictions: Yes LUE Weight Bearing: Non weight bearing (brace) RLE Weight Bearing: Weight bearing as tolerated (in R knee brace locked in extension)      Mobility  Bed Mobility Overal bed mobility: Needs Assistance Bed Mobility: Supine to Sit     Supine to sit: Min assist;HOB elevated     General bed mobility comments: bed rails    Transfers Overall transfer level: Needs assistance Equipment used: 1 person hand held assist Transfers: Sit to/from [employer] Sit to Stand: Min assist;Max assist Stand pivot transfers:  Min assist       General transfer comment: min assist to transfer sit>stand from elevated EOB, MAX assist & cuing to slide RLE underneath BOS when able when transferring sit>stand from low recliner  Ambulation/Gait Ambulation/Gait assistance:  (pt declined)              Stairs            Wheelchair Mobility    Modified Rankin (Stroke Patients Only)       Balance Overall balance assessment: Needs assistance Sitting-balance support: Feet supported Sitting balance-Leahy Scale: Good     Standing balance support: Single extremity supported;During functional activity Standing balance-Leahy Scale: Poor                               Pertinent Vitals/Pain Pain Assessment: No/denies pain    Home Living Family/patient expects to be discharged to:: Skilled nursing facility Living Arrangements: Alone Available Help at Discharge: Personal care attendant;Available PRN/intermittently Type of Home: Apartment Home Access: Elevator     Home Layout: One level        Prior Function                 Hand Dominance        Extremity/Trunk Assessment   Upper Extremity Assessment Upper Extremity Assessment:  (LUE in brace)    Lower Extremity Assessment Lower Extremity Assessment: Generalized weakness (RLE brace locked in extension)       Communication      Cognition Arousal/Alertness: Awake/alert Behavior During Therapy: [facility]  for tasks assessed/performed Overall Cognitive Status: Within Functional Limits for tasks assessed                                        General Comments General comments (skin integrity, edema, etc.): Pt on 4L/min via nasal cannula, endorses some SOB but SpO2 100%    Exercises     Assessment/Plan    PT Assessment Patient needs continued PT services  PT Problem List Decreased strength;Decreased mobility;Decreased safety awareness;Decreased range of motion;Decreased activity tolerance;Decreased  balance;Decreased knowledge of precautions;Decreased knowledge of use of DME;Cardiopulmonary status limiting activity       PT Treatment Interventions DME instruction;Therapeutic activities;Modalities;Gait training;Therapeutic exercise;Patient/family education;Balance training;Wheelchair mobility training;Functional mobility training;Neuromuscular re-education    PT Goals (Current goals can be found in the Care Plan section)  Acute Rehab PT Goals Patient Stated Goal: go to rehab PT Goal Formulation: With patient Time For Goal Achievement: 12/29/20 Potential to Achieve Goals: Good    Frequency Min 2X/week   Barriers to discharge Decreased caregiver support lives alone    Co-evaluation               AM-PAC PT "6 Clicks" Mobility  Outcome Measure Help needed turning from your back to your side while in a flat bed without using bedrails?: A Little Help needed moving from lying on your back to sitting on the side of a flat bed without using bedrails?: A Little Help needed moving to and from a bed to a chair (including a wheelchair)?: A Little Help needed standing up from a chair using your arms (e.g., wheelchair or bedside chair)?: A Lot Help needed to walk in hospital room?: A Lot Help needed climbing 3-5 steps with a railing? : Total 6 Click Score: 14    End of Session Equipment Utilized During Treatment:  (LUE & RLE braces donned throughout session) Activity Tolerance: Patient limited by fatigue Patient left: in chair;with call bell/phone within reach;with chair alarm set Nurse Communication: Mobility status PT Visit Diagnosis: Muscle weakness (generalized) (M62.81);Difficulty in walking, not elsewhere classified (R26.2);Unsteadiness on feet (R26.81)    Time: 2951-8841 PT Time Calculation (min) (ACUTE ONLY): 21 min   Charges:   PT Evaluation $PT Eval Moderate Complexity: 1 Mod          Aleda Grana, PT, DPT 12/15/20, 3:20 PM   Sandi Mariscal 12/15/2020,  3:17 PM

## 2020-12-15 NOTE — Progress Notes (Signed)
*  PRELIMINARY RESULTS* Echocardiogram 2D Echocardiogram has been performed.  Neita Garnet Kymber Kosar 12/15/2020, 3:41 PM

## 2020-12-15 NOTE — Progress Notes (Signed)
Cpap not initiated at this time.  Pt is asleep.  Chart review shows no evidence of OSA or prior cpap use.

## 2020-12-15 NOTE — Progress Notes (Signed)
OT Cancellation Note  Patient Details Name: [name] MRN: [id number] DOB: 01-Jun-1945   Cancelled Treatment:    Reason Eval/Treat Not Completed: Patient at procedure or test/ unavailable Pt getting echo at time of OT attempt, will f/u at later date/time for OT evaluation. Thank you.  Rejeana Brock, MS, OTR/L ascom [phone] 12/15/20, 1:58 PM

## 2020-12-15 NOTE — Progress Notes (Signed)
[facility]  - [place] at [facility]   PATIENT NAME: [name]    MR#:  [id number]  DATE OF BIRTH:  June 27, 1945  SUBJECTIVE:  patient has history of chronic anxiety. Tells me she can't breathe and she is going to die [place] 99 200% on 2 L. No family at bedside. Woke up with a nightmare  REVIEW OF SYSTEMS:   Review of Systems  Constitutional:  Negative for chills, fever and weight loss.  HENT:  Negative for ear discharge, ear pain and nosebleeds.   Eyes:  Negative for blurred vision, pain and discharge.  Respiratory:  Positive for shortness of breath. Negative for sputum production, wheezing and stridor.   Cardiovascular:  Negative for chest pain, palpitations, orthopnea and PND.  Gastrointestinal:  Negative for abdominal pain, diarrhea, nausea and vomiting.  Genitourinary:  Negative for frequency and urgency.  Musculoskeletal:  Negative for back pain and joint pain.  Neurological:  Negative for sensory change, speech change, focal weakness and weakness.  Psychiatric/Behavioral:  Negative for depression and hallucinations. The patient is nervous/anxious.   Tolerating Diet:yes Tolerating PT:   DRUG ALLERGIES:   Allergies  Allergen Reactions   Celecoxib     Other reaction(s): Other (qualifier value) Patient is allergic to Celebrex and found that it caused heart failure. CHF   Pregabalin     Other reaction(s): Localized superficial swelling of skin    VITALS:  Blood pressure (!) 143/90, pulse 91, temperature 98 F (36.7 C), resp. rate 18, height 5\' 6"  (1.676 m), weight 64.4 kg, SpO2 98 %.  PHYSICAL EXAMINATION:   Physical Exam  GENERAL:  75 y.o.-year-old patient lying in the bed with no acute distress.  LUNGS: Normal breath sounds bilaterally, no wheezing, rales, rhonchi. No use of accessory muscles of respiration.  CARDIOVASCULAR: S1, S2 normal. No murmurs, rubs, or gallops.  ABDOMEN: Soft, nontender, nondistended. Bowel sounds present. No  organomegaly or mass.  EXTREMITIES: No cyanosis, clubbing or edema b/l.   Brace left arm and right knee  NEUROLOGIC: non focal PSYCHIATRIC:  patient is alert and anxious SKIN: No obvious rash, lesion, or ulcer.   LABORATORY PANEL:  CBC Recent Labs  Lab 12/15/20 0552  WBC 2.7*  HGB 12.1  HCT 38.6  PLT 228    Chemistries  Recent Labs  Lab 12/14/20 1707 12/15/20 0552  NA 128* 130*  K 4.0 3.9  CL 86* 82*  CO2 38* 37*  GLUCOSE 160* 127*  BUN 12 11  CREATININE 0.42* 0.56  CALCIUM 8.8* 9.4  AST 23  --   ALT 12  --   ALKPHOS 51  --   BILITOT 0.6  --    Cardiac Enzymes No results for input(s): TROPONINI in the last 168 hours. RADIOLOGY:  DG Chest 1 View  Result Date: 12/14/2020 CLINICAL DATA:  Shortness of breath.  History of COPD and CHF. EXAM: CHEST  1 VIEW COMPARISON:  November 18, 2020 FINDINGS: Mild cardiomegaly. The hila and mediastinum are unchanged. No pneumothorax. Small layering pleural effusions with underlying atelectasis and mild pulmonary edema identified. No other acute abnormalities. IMPRESSION: Mild cardiomegaly, small layering effusions, and mild pulmonary edema. Electronically Signed   By: November 20, 2020 III M.D   On: 12/14/2020 17:16   CT Angio Chest PE W and/or Wo Contrast  Result Date: 12/14/2020 CLINICAL DATA:  copd exacerbation. SOB tachy despite treatment, elevated dimer, immobilized. eval PE EXAM: CT ANGIOGRAPHY CHEST WITH CONTRAST TECHNIQUE: Multidetector CT imaging of the chest was performed using the  standard protocol during bolus administration of intravenous contrast. Multiplanar CT image reconstructions and MIPs were obtained to evaluate the vascular anatomy. CONTRAST:  68mL OMNIPAQUE IOHEXOL 350 MG/ML SOLN COMPARISON:  Chest x-ray 12/14/2020, CT chest 03/07/2020 FINDINGS: Cardiovascular: Satisfactory opacification of the pulmonary arteries to the segmental level. No evidence of pulmonary embolism. Enlarged main pulmonary artery. Normal heart size. No  significant pericardial effusion. The thoracic aorta is normal in caliber. At least mild atherosclerotic plaque of the thoracic aorta. Four-vessel coronary artery calcifications. Mediastinum/Nodes: No enlarged mediastinal, hilar, or axillary lymph nodes. Thyroid gland, trachea, and esophagus demonstrate no significant findings. Lungs/Pleura: Expiratory phase of respiration. Diffuse paraseptal and centrilobular emphysematous changes. Bilateral lower lobe passive atelectasis. Bilateral trace to small volume pleural effusions, right greater than left. No pulmonary nodule. No pulmonary mass. No definite focal consolidation. No pneumothorax. Upper Abdomen: No acute abnormality. Musculoskeletal: No chest wall abnormality. No suspicious lytic or blastic osseous lesions. No acute displaced fracture. Multilevel degenerative changes of the spine. Review of the MIP images confirms the above findings. IMPRESSION: 1. No pulmonary embolus. 2. Bilateral trace to small volume pleural effusions, right greater than left. 3. Enlarged main pulmonary artery suggestive of pulmonary hypertension. 4. Aortic Atherosclerosis (ICD10-I70.0) and Emphysema (ICD10-J43.9). Including four-vessel coronary artery calcifications. Electronically Signed   By: Tish Frederickson M.D.   On: 12/14/2020 19:24   ASSESSMENT AND PLAN:   SHAKYA SEBRING is a 75 y.o. female with medical history significant for anxiety, hyperlipidemia, hypothyroid, history of COPD, chronic respiratory failure requiring 2 L nasal cannula baseline, depression, history of tobacco use, presents to the emergency department for chief concerns of shortness of breath.  She reports the shortness of breath is worse when she lays flat and with exertion  # Shortness of breath suspect secondary to heart failure exacerbation, diastolic acute on chronic - BNP was elevated at 304 - Lasix 40 mg IV twice daily, 3 doses ordered--change to oral in am - DuoNebs 3 times daily scheduled - Strict  I's and O's - CTA chest was read as negative for pulmonary embolism.  Bilateral trace small volume pleural effusion, right greater than left.  Enlarged main pulmonary artery suggestive of pulmonary hypertension. -- Patient uses chronic oxygen 2 L nasal cannula. Sats 99%  # Mild hyponatremia-I suspect this is secondary to hypotonic in setting of volume overload - Treat as above with Lasix - sodium improving   # Pulmonary hypertension-CPAP nightly ordered   # COPD/bronchitis-secondary to history of tobacco use, resume home inhalers including DuoNeb scheduled   # Hypothyroid-resumed levothyroxine 88 mcg before breakfast daily   # Depression/anxiety-resume fluoxetine - Ativan 1 mg as needed nightly for sleep and anxiety  # Hyperlipidemia-rosuvastatin   # History of left olecranon fracture secondary to mechanical fall status post ORIF-- PT OT to see patient. Will resume therapy at discharge.    Family communication : spoke with Marcelino Duster daughter on the phone Consults : CODE STATUS: full DVT Prophylaxis : enoxaparin Level of care: Med-Surg Status is: Observation  The patient remains OBS appropriate and will d/c before 2 midnights.  Dispo: The patient is from: Home              Anticipated d/c is to: Home tomorrow 8/1              Patient currently is medically stable to d/c.   Difficult to place patient No        TOTAL TIME TAKING CARE OF THIS PATIENT: 35 minutes.  >50%  time spent on counselling and coordination of care  Note: This dictation was prepared with Dragon dictation along with smaller phrase technology. Any transcriptional errors that result from this process are unintentional.  Enedina Finner M.D    [facility]   CC: Primary care physician; Lyndon Code, MD Patient ID: Marja Kays, female   DOB: 06-13-1945, 75 y.o.   MRN: [id number]

## 2020-12-16 ENCOUNTER — Telehealth: Payer: Self-pay

## 2020-12-16 ENCOUNTER — Other Ambulatory Visit: Payer: Self-pay | Admitting: *Deleted

## 2020-12-16 DIAGNOSIS — R0602 Shortness of breath: Secondary | ICD-10-CM | POA: Diagnosis not present

## 2020-12-16 LAB — PROCALCITONIN: Procalcitonin: <0.1 ng/mL

## 2020-12-16 MED ORDER — FUROSEMIDE 20 MG PO TABS
20.0000 mg | ORAL_TABLET | Freq: Every day | ORAL | Status: DC
  Administered 2020-12-16 – 2020-12-17 (×2): 20 mg via ORAL
  Filled 2020-12-16 (×2): qty 1

## 2020-12-16 NOTE — Patient Outreach (Signed)
[facility]) Care Management  12/16/2020  JOETTE SCHMOKER 06-09-1945 [id number]  Patient was admitted to the hospital on 12/14/20. Nurse Health Coach will perform case closure and transfer patient to the [facility] CM Team. Nurse has informed [facility] Coordinator of patient's admission.    Plan: RN Health Coach Discipline Closure Discipline Closure letter sent to PCP   [name] RN, BSN Nurse Health Coach [facility] [phone] [email or web address]

## 2020-12-16 NOTE — Progress Notes (Signed)
[facility]  - [place] at [facility]   PATIENT NAME: [name]    MR#:  [id number]  DATE OF BIRTH:  [birth year]  SUBJECTIVE:  patient has history of chronic anxiety. Sats on 99-100% on 2 L. No family at bedside. Overall appears at baseline. No new complaints  REVIEW OF SYSTEMS:   Review of Systems  Constitutional:  Negative for chills, fever and weight loss.  HENT:  Negative for ear discharge, ear pain and nosebleeds.   Eyes:  Negative for blurred vision, pain and discharge.  Respiratory:  Positive for shortness of breath. Negative for sputum production, wheezing and stridor.   Cardiovascular:  Negative for chest pain, palpitations, orthopnea and PND.  Gastrointestinal:  Negative for abdominal pain, diarrhea, nausea and vomiting.  Genitourinary:  Negative for frequency and urgency.  Musculoskeletal:  Negative for back pain and joint pain.  Neurological:  Positive for weakness. Negative for sensory change, speech change and focal weakness.  Psychiatric/Behavioral:  Negative for depression and hallucinations. The patient is nervous/anxious.   Tolerating Diet:yes Tolerating PT: SNF  DRUG ALLERGIES:   Allergies  Allergen Reactions   Celecoxib     Other reaction(s): Other (qualifier value) Patient is allergic to Celebrex and found that it caused heart failure. CHF   Pregabalin     Other reaction(s): Localized superficial swelling of skin    VITALS:  Blood pressure (!) 166/77, pulse 97, temperature 98.2 F (36.8 C), temperature source Oral, resp. rate 18, height 5\' 6"  (1.676 m), weight 64.4 kg, SpO2 100 %.  PHYSICAL EXAMINATION:   Physical Exam  GENERAL:  75 y.o.-year-old patient lying in the bed with no acute distress.  LUNGS: decrease breath sounds bilaterally, no wheezing, rales, rhonchi. No use of accessory muscles of respiration.  CARDIOVASCULAR: S1, S2 normal. No murmurs, rubs, or gallops.  ABDOMEN: Soft, nontender, nondistended. Bowel sounds  present. No organomegaly or mass.  EXTREMITIES: No cyanosis, clubbing or edema b/l.   Brace left arm and right knee  NEUROLOGIC: non focal PSYCHIATRIC:  patient is alert and anxious SKIN: No obvious rash, lesion, or ulcer.   LABORATORY PANEL:  CBC Recent Labs  Lab 12/15/20 0552  WBC 2.7*  HGB 12.1  HCT 38.6  PLT 228     Chemistries  Recent Labs  Lab 12/14/20 1707 12/15/20 0552  NA 128* 130*  K 4.0 3.9  CL 86* 82*  CO2 38* 37*  GLUCOSE 160* 127*  BUN 12 11  CREATININE 0.42* 0.56  CALCIUM 8.8* 9.4  AST 23  --   ALT 12  --   ALKPHOS 51  --   BILITOT 0.6  --     Cardiac Enzymes No results for input(s): TROPONINI in the last 168 hours. RADIOLOGY:  DG Chest 1 View  Result Date: 12/14/2020 CLINICAL DATA:  Shortness of breath.  History of COPD and CHF. EXAM: CHEST  1 VIEW COMPARISON:  November 18, 2020 FINDINGS: Mild cardiomegaly. The hila and mediastinum are unchanged. No pneumothorax. Small layering pleural effusions with underlying atelectasis and mild pulmonary edema identified. No other acute abnormalities. IMPRESSION: Mild cardiomegaly, small layering effusions, and mild pulmonary edema. Electronically Signed   By: November 20, 2020 III M.D   On: 12/14/2020 17:16   CT Angio Chest PE W and/or Wo Contrast  Result Date: 12/14/2020 CLINICAL DATA:  copd exacerbation. SOB tachy despite treatment, elevated dimer, immobilized. eval PE EXAM: CT ANGIOGRAPHY CHEST WITH CONTRAST TECHNIQUE: Multidetector CT imaging of the chest was performed using the standard protocol  during bolus administration of intravenous contrast. Multiplanar CT image reconstructions and MIPs were obtained to evaluate the vascular anatomy. CONTRAST:  75mL OMNIPAQUE IOHEXOL 350 MG/ML SOLN COMPARISON:  Chest x-ray 12/14/2020, CT chest 03/07/2020 FINDINGS: Cardiovascular: Satisfactory opacification of the pulmonary arteries to the segmental level. No evidence of pulmonary embolism. Enlarged main pulmonary artery. Normal  heart size. No significant pericardial effusion. The thoracic aorta is normal in caliber. At least mild atherosclerotic plaque of the thoracic aorta. Four-vessel coronary artery calcifications. Mediastinum/Nodes: No enlarged mediastinal, hilar, or axillary lymph nodes. Thyroid gland, trachea, and esophagus demonstrate no significant findings. Lungs/Pleura: Expiratory phase of respiration. Diffuse paraseptal and centrilobular emphysematous changes. Bilateral lower lobe passive atelectasis. Bilateral trace to small volume pleural effusions, right greater than left. No pulmonary nodule. No pulmonary mass. No definite focal consolidation. No pneumothorax. Upper Abdomen: No acute abnormality. Musculoskeletal: No chest wall abnormality. No suspicious lytic or blastic osseous lesions. No acute displaced fracture. Multilevel degenerative changes of the spine. Review of the MIP images confirms the above findings. IMPRESSION: 1. No pulmonary embolus. 2. Bilateral trace to small volume pleural effusions, right greater than left. 3. Enlarged main pulmonary artery suggestive of pulmonary hypertension. 4. Aortic Atherosclerosis (ICD10-I70.0) and Emphysema (ICD10-J43.9). Including four-vessel coronary artery calcifications. Electronically Signed   By: Tish FredericksonMorgane  Naveau M.D.   On: 12/14/2020 19:24   ECHOCARDIOGRAM COMPLETE  Result Date: 12/15/2020    ECHOCARDIOGRAM REPORT   Patient Name:   Danielle KaysBETTY C Poole Date of Exam: 12/15/2020 Medical Rec #:  [id number]     Height:       66.0 in Accession #:    [id number]    Weight:       142.0 lb Date of Birth:  [birth year]     BSA:          1.729 m Patient Age:    75 years      BP:           178/74 mmHg Patient Gender: F             HR:           104 bpm. Exam Location:  [facility] Procedure: 2D Echo, Cardiac Doppler and Color Doppler Indications:     Dyspnea R06.00  History:         Patient has prior history of Echocardiogram examinations. CHF;                  Risk Factors:Diabetes.  Sonographer:      Neysa BonitoChristy Roar Referring Phys:  [id number] AMY N COX Diagnosing Phys: Julien Nordmannimothy Gollan MD IMPRESSIONS  1. Left ventricular ejection fraction, by estimation, is 60 to 65%. The left ventricle has normal function. The left ventricle has no regional wall motion abnormalities. Left ventricular diastolic parameters were normal.  2. Right ventricular systolic function is normal. The right ventricular size is normal.  3. The mitral valve is normal in structure. Mild mitral valve regurgitation. FINDINGS  Left Ventricle: Left ventricular ejection fraction, by estimation, is 60 to 65%. The left ventricle has normal function. The left ventricle has no regional wall motion abnormalities. The left ventricular internal cavity size was normal in size. There is  no left ventricular hypertrophy. Left ventricular diastolic parameters were normal. Right Ventricle: The right ventricular size is normal. No increase in right ventricular wall thickness. Right ventricular systolic function is normal. Left Atrium: Left atrial size was normal in size. Right Atrium: Right atrial size was normal in size. Pericardium: There is no evidence of  pericardial effusion. Mitral Valve: The mitral valve is normal in structure. Mild mitral valve regurgitation. No evidence of mitral valve stenosis. Tricuspid Valve: The tricuspid valve is normal in structure. Tricuspid valve regurgitation is not demonstrated. No evidence of tricuspid stenosis. Aortic Valve: The aortic valve is normal in structure. Aortic valve regurgitation is not visualized. No aortic stenosis is present. Aortic valve peak gradient measures 9.7 mmHg. Pulmonic Valve: The pulmonic valve was normal in structure. Pulmonic valve regurgitation is not visualized. No evidence of pulmonic stenosis. Aorta: The aortic root is normal in size and structure. Venous: The inferior vena cava is normal in size with greater than 50% respiratory variability, suggesting right atrial pressure of 3 mmHg. IAS/Shunts: No  atrial level shunt detected by color flow Doppler.  LEFT VENTRICLE PLAX 2D LVIDd:         3.62 cm  Diastology LVIDs:         2.53 cm  LV e' medial:    10.30 cm/s LV PW:         0.86 cm  LV E/e' medial:  17.6 LV IVS:        0.68 cm  LV e' lateral:   14.50 cm/s LVOT diam:     1.80 cm  LV E/e' lateral: 12.5 LVOT Area:     2.54 cm  RIGHT VENTRICLE RV Mid diam:    2.96 cm RV S prime:     13.40 cm/s TAPSE (M-mode): 2.3 cm LEFT ATRIUM             Index       RIGHT ATRIUM           Index LA diam:        3.60 cm 2.08 cm/m  RA Area:     10.80 cm LA Vol (A2C):   59.3 ml 34.30 ml/m RA Volume:   22.60 ml  13.07 ml/m LA Vol (A4C):   50.1 ml 28.98 ml/m LA Biplane Vol: 55.1 ml 31.87 ml/m  AORTIC VALVE                PULMONIC VALVE AV Area (Vmax): 2.04 cm    PV Vmax:        1.11 m/s AV Vmax:        156.00 cm/s PV Peak grad:   4.9 mmHg AV Peak Grad:   9.7 mmHg    RVOT Peak grad: 4 mmHg LVOT Vmax:      125.00 cm/s  AORTA Ao Root diam: 2.30 cm MITRAL VALVE MV Area (PHT): 4.41 cm     SHUNTS MV Decel Time: 172 msec     Systemic Diam: 1.80 cm MV E velocity: 181.00 cm/s MV A velocity: 155.00 cm/s MV E/A ratio:  1.17 MV A Prime:    10.2 cm/s Julien Nordmann MD Electronically signed by Julien Nordmann MD Signature Date/Time: 12/15/2020/4:06:02 PM    Final    ASSESSMENT AND PLAN:   [name] is a 75 y.o. female with medical history significant for anxiety, hyperlipidemia, hypothyroid, history of COPD, chronic respiratory failure requiring 2 L nasal cannula baseline, depression, history of tobacco use, presents to the emergency department for chief concerns of shortness of breath.  She reports the shortness of breath is worse when she lays flat and with exertion  # Shortness of breath suspect secondary to heart failure exacerbation, diastolic acute on chronic - BNP was elevated at 304 - Lasix 40 mg IV twice daily, 3 doses ordered--change to oral lasix 20 mg qd -  DuoNebs 3 times daily scheduled - Strict I's and O's - CTA  chest was read as negative for pulmonary embolism.  Bilateral trace small volume pleural effusion, right greater than left.  Enlarged main pulmonary artery suggestive of pulmonary hypertension. -- Patient uses chronic oxygen 2 L nasal cannula. Sats 99%  # Mild hyponatremia - suspect this is secondary to hypotonic in setting of volume overload - Treat as above with Lasix - sodium improving   # Pulmonary hypertension-CPAP nightly ordered   # COPD/bronchitis-secondary to history of tobacco use, resume home inhalers including DuoNeb scheduled   # Hypothyroid-resumed levothyroxine   # Depression/anxiety-resume fluoxetine - Ativan 1 mg as needed nightly for sleep and anxiety  # Hyperlipidemia-rosuvastatin   # History of left olecranon fracture secondary to mechanical fall status post ORIF-- PT OT recommends rehab  TOC for d/c planning    Family communication : spoke with Marcelino Duster daughter on the phone Consults : CODE STATUS: full DVT Prophylaxis : enoxaparin Level of care: Med-Surg Status is: inpateint    Dispo: The patient is from: Home              Anticipated d/c is to:SNF once bed available.              Patient currently is medically stable to d/c.   Difficult to place patient No   TOC aware. Patient will discharged once bed available.     TOTAL TIME TAKING CARE OF THIS PATIENT: 25 minutes.  >50% time spent on counselling and coordination of care  Note: This dictation was prepared with Dragon dictation along with smaller phrase technology. Any transcriptional errors that result from this process are unintentional.  Enedina Finner M.D    [facility]   CC: Primary care physician; Lyndon Code, MD Patient ID: [name], female   DOB: 04/06/1946, 75 y.o.   MRN: [id number]

## 2020-12-16 NOTE — TOC Initial Note (Addendum)
Transition of Care [facility]) - Initial/Assessment Note    Patient Details  Name: [name] MRN: [id number] Date of Birth: [birth year]  Transition of Care [facility]) CM/SW Contact:    Gildardo Griffes, LCSW Phone Number: 12/16/2020, 8:59 AM  Clinical Narrative:                  Update: Patient informed of below information and reports being in agreement with CSW faxing out bed offers at this time. CSW has sent referrals pending bed offers. Insurance Berkley Harvey has been started with [facility], still pending bed offers at this time.  CSW spoke with patient regarding discharge planning and SNF placement. CSW informed patient of recommendations of SNF, patient also noted to recently be at [facility] before being discharged home. Patient reports she really wants to go home however asked that CSW call her aide leslie.   CSW called Verlon Au who reports that this is not her decision and that CSW should call patient's case worker with [facility], Advice worker.   CSW called [facility] who reports patient gets aide service 2 hours a day, reports he understands patient is fully oriented and can make her own decisions, however he did express concerns with her going home given her current weakness.   CSW spoke with MD, to request PT to work with patient again to re assess mobility if patient can safely dc home per her wishes.  Expected Discharge Plan: Home w Home Health Services Barriers to Discharge: Continued Medical Work up   Patient Goals and CMS Choice Patient states their goals for this hospitalization and ongoing recovery are:: to go home CMS Medicare.gov Compare Post Acute Care list provided to:: Patient Choice offered to / list presented to : Patient  Expected Discharge Plan and Services Expected Discharge Plan: Home w Home Health Services       Living arrangements for the past 2 months: Single Family Home                                      Prior Living Arrangements/Services Living arrangements for  the past 2 months: Single Family Home Lives with:: Self Patient language and need for interpreter reviewed:: Yes Do you feel safe going back to the place where you live?: Yes      Need for Family Participation in Patient Care: Yes (Comment) Care giver support system in place?: Yes (comment) Current home services: Homehealth aide Criminal Activity/Legal Involvement Pertinent to Current Situation/Hospitalization: No - Comment as needed  Activities of Daily Living Home Assistive Devices/Equipment: Oxygen ADL Screening (condition at time of admission) Patient's cognitive ability adequate to safely complete daily activities?: Yes Is the patient deaf or have difficulty hearing?: No Does the patient have difficulty seeing, even when wearing glasses/contacts?: No Does the patient have difficulty concentrating, remembering, or making decisions?: No Patient able to express need for assistance with ADLs?: Yes Does the patient have difficulty dressing or bathing?: Yes Independently performs ADLs?: No Communication: Independent Dressing (OT): Needs assistance Is this a change from baseline?: Pre-admission baseline Grooming: Needs assistance Is this a change from baseline?: Pre-admission baseline Feeding: Independent Bathing: Needs assistance Is this a change from baseline?: Pre-admission baseline Toileting: Needs assistance Is this a change from baseline?: Pre-admission baseline In/Out Bed: Needs assistance Is this a change from baseline?: Pre-admission baseline Walks in Home: Needs assistance Is this a change from baseline?: Pre-admission baseline Does the patient  have difficulty walking or climbing stairs?: No Weakness of Legs: Both Weakness of Arms/Hands: Left  Permission Sought/Granted Permission sought to share information with : Case Manager, Magazine features editor, Family Supports Permission granted to share information with : Yes, Verbal Permission Granted  Share  Information with NAME: Verlon Au  Permission granted to share info w AGENCY: HH  Permission granted to share info w Relationship: aide  Permission granted to share info w Contact Information: [phone]  Emotional Assessment   Attitude/Demeanor/Rapport: Gracious Affect (typically observed): Calm Orientation: : Oriented to Self, Oriented to Place, Oriented to Situation, Oriented to  Time Alcohol / Substance Use: Not Applicable Psych Involvement: No (comment)  Admission diagnosis:  Shortness of breath [R06.02] Sinus tachycardia [R00.0] Pain [R52] Pulmonary hypertension (HCC) [I27.20] COPD exacerbation (HCC) [J44.1] Acute exacerbation of CHF (congestive heart failure) (HCC) [I50.9] Patient Active Problem List   Diagnosis Date Noted   Acute exacerbation of CHF (congestive heart failure) (HCC) 12/14/2020   Patellar fracture 11/18/2020   Olecranon fracture 11/18/2020   Major depressive disorder, recurrent, in remission, unspecified (HCC) 04/23/2020   Pulmonary HTN (HCC)    Hypothyroidism, acquired 04/25/2019   Acute metabolic encephalopathy 03/19/2019   Confusion 03/17/2019   AMS (altered mental status) 03/12/2019   Acute encephalopathy 03/11/2019   Hypothyroidism 03/11/2019   Altered mental status, unspecified 03/11/2019   Medicare annual wellness visit, subsequent 06/13/2018   PSVT (paroxysmal supraventricular tachycardia) (HCC) 10/05/2017   Age-related osteoporosis without current pathological fracture 09/15/2017   Chronic obstructive pulmonary disease (HCC) 05/21/2017   Shortness of breath 04/05/2017   Bilateral carotid artery stenosis 03/17/2017   Hyperlipidemia, mixed 03/17/2017   Orthostatic hypotension 01/12/2017   Bronchitis 01/04/2017   Angina pectoris (HCC) 06/13/2015   UTI (lower urinary tract infection) 06/13/2015   Degenerative disc disease, lumbar 10/18/2014   Sacroiliac joint dysfunction 10/18/2014   Facet syndrome, lumbar 10/18/2014   Cervical facet joint  syndrome 10/18/2014   Bilateral occipital neuralgia 10/18/2014   Migraine 10/18/2014   Neurosis, posttraumatic 10/08/2014   Posttraumatic stress disorder 10/08/2014   Spinal stenosis    PTSD (post-traumatic stress disorder)    PCP:  Lyndon Code, MD Pharmacy:   [facility] - [place], [place] - [address] AT [facility] [address] [place] [address] Phone: [phone] Fax: [phone]  [id number] [place], [place] - [address] AT [facility] OF [address] [facility] [place] [address] Phone: [phone] Fax: [phone]     Social Determinants of Health (SDOH) Interventions    Readmission Risk Interventions No flowsheet data found.

## 2020-12-16 NOTE — Telephone Encounter (Signed)
Gave  verbal order for [facility] [phone] a week for 1 week and twice a week for 4 week and once a week for 4 weeks

## 2020-12-16 NOTE — Evaluation (Signed)
Occupational Therapy Evaluation Patient Details Name: [name] MRN: [id number] DOB: 11-Dec-1945 Today's Date: 12/16/2020    History of Present Illness Pt is a 75 y/o F admitted on 12/14/20 with c/c of SOB suspect 2/2 heart failure exacerbation. PMH: anxiety, HLD, hypothyroid, COPD, on 2L O2 at baseline, depression, tobacco abuse, DM, fibromyalgia, graves disease, IBS, HTN, spinal stenosis. Recent admission (July 2022) d/t displaced L olecranon fx and closed nondisplaced patella fracture that she sustained after a fall at home   Clinical Impression   Pt seen for OT evaluation this date. Upon arrival to room, pt sitting in recliner following mobility with PT. Pt appearing SOB however SpO2 96% and HR 99. Pt agreeable to OT evaluation. Prior to fall (July 4th 2022), pt was MOD-I for BADL, using a 4WW for functional mobility. Pt was also living alone in a 2nd floor apartment with elevator to enter. Following most recent admission, pt discharged to SNF and reports that she was recently discharged from SNF. Pt currently presents with decreased activity tolerance, decreased balance, and limited ROM/weight bearing restrictions of  LUE. Due to these impairments, pt requires MIN A for UB dressing, MIN GUARD for sit<>stand transfers, and MAX A for sit>stand peri-care. Pt would benefit from additional skilled OT services to maximize return to PLOF and minimize risk of future falls, injury, caregiver burden, and readmission. Upon discharge, recommend SNF.        Follow Up Recommendations  SNF    Equipment Recommendations  Other (comment) (defer to next venue of care)       Precautions / Restrictions Precautions Precautions: Fall Restrictions Weight Bearing Restrictions: Yes LUE Weight Bearing: Non weight bearing RLE Weight Bearing: Weight bearing as tolerated      Mobility Bed Mobility               General bed mobility comments: Not assessed, pt in recliner at beginning and end of session     Transfers Overall transfer level: Needs assistance Equipment used: Rolling walker (2 wheeled) Transfers: Sit to/from Stand Sit to Stand: Min guard         General transfer comment: Verbal cues for safe hand placement    Balance Overall balance assessment: Needs assistance Sitting-balance support: Feet supported Sitting balance-Leahy Scale: Good Sitting balance - Comments: Good static sitting balance reaching within BOS   Standing balance support: Single extremity supported;During functional activity Standing balance-Leahy Scale: Poor Standing balance comment: requires unilateral UE support from RW while receiving assistance for sit>stand posterior peri-care                           ADL either performed or assessed with clinical judgement   ADL Overall ADL's : Needs assistance/impaired     Grooming: Wash/dry hands;Minimal assistance;Sitting Grooming Details (indicate cue type and reason): MIN A for thoroughness         Upper Body Dressing : Minimal assistance;Sitting Upper Body Dressing Details (indicate cue type and reason): MIN A to don/doff hospital gown in setting of decreased LUE use in brace         Toileting- Clothing Manipulation and Hygiene: Maximal assistance;Sit to/from stand Toileting - Clothing Manipulation Details (indicate cue type and reason): MAX A for posterior peri-care     Functional mobility during ADLs: Minimal assistance;Rolling walker       Vision Baseline Vision/History: Wears glasses Wears Glasses: At all times Patient Visual Report: Blurring of vision Additional Comments: Pt complains of blurred  vision since fall in July 2022            Pertinent Vitals/Pain Pain Assessment: No/denies pain     Hand Dominance Right   Extremity/Trunk Assessment Upper Extremity Assessment Upper Extremity Assessment: Generalized weakness;LUE deficits/detail LUE: Unable to fully assess due to immobilization   Lower Extremity  Assessment Lower Extremity Assessment: Generalized weakness;RLE deficits/detail RLE: Unable to fully assess due to immobilization       Communication Communication Communication: No difficulties   Cognition Arousal/Alertness: Awake/alert Behavior During Therapy: WFL for tasks assessed/performed Overall Cognitive Status: Within Functional Limits for tasks assessed                                 General Comments: Decreased safety awareness, however motivated and agreeable throughout   General Comments  Pt endorses some SOB but SpO2 >94%    Exercises Other Exercises Other Exercises: Discussion regarding role of OT, POC, and d/c recommendations, with pt verbalizing understanding        Home Living Family/patient expects to be discharged to:: Private residence   Available Help at Discharge: Personal care attendant;Available PRN/intermittently (PCA assists 2hrs/week) Type of Home: Apartment Home Access: Elevator     Home Layout: One level     Bathroom Shower/Tub: Tub/shower unit         Home Equipment: Emergency planning/management officer - 4 wheels   Additional Comments: Pt recently discharged from SNF following recent admission following fall      Prior Functioning/Environment Level of Independence: Needs assistance  Gait / Transfers Assistance Needed: At baseline, pt utilizes rollator for household mobility, community distances requires assistance ADL's / Homemaking Assistance Needed: At baseline, pt mod-independent with BADLs. Utilizes some assistance for cleaning, cooking, intermittent bathing            OT Problem List: Decreased strength;Decreased range of motion;Decreased activity tolerance;Impaired balance (sitting and/or standing);Impaired vision/perception;Decreased safety awareness;Decreased knowledge of use of DME or AE;Decreased knowledge of precautions;Impaired UE functional use      OT Treatment/Interventions: Self-care/ADL training;Therapeutic  exercise;Energy conservation;DME and/or AE instruction;Therapeutic activities;Patient/family education;Balance training;Visual/perceptual remediation/compensation    OT Goals(Current goals can be found in the care plan section) Acute Rehab OT Goals Patient Stated Goal: to return to PLOF OT Goal Formulation: With patient Time For Goal Achievement: 12/30/20 Potential to Achieve Goals: Fair ADL Goals Pt Will Perform Upper Body Dressing: with supervision;with set-up;sitting Pt Will Transfer to Toilet: with supervision;ambulating;bedside commode Pt Will Perform Toileting - Clothing Manipulation and hygiene: with min assist;sit to/from stand  OT Frequency: Min 1X/week    AM-PAC OT "6 Clicks" Daily Activity     Outcome Measure Help from another person eating meals?: A Little Help from another person taking care of personal grooming?: A Little Help from another person toileting, which includes using toliet, bedpan, or urinal?: A Lot Help from another person bathing (including washing, rinsing, drying)?: A Lot Help from another person to put on and taking off regular upper body clothing?: A Little Help from another person to put on and taking off regular lower body clothing?: A Lot 6 Click Score: 15   End of Session Equipment Utilized During Treatment: Gait belt;Rolling walker Nurse Communication: Mobility status  Activity Tolerance: Patient tolerated treatment well Patient left: in chair;with chair alarm set;with call bell/phone within reach;with nursing/sitter in room  OT Visit Diagnosis: Unsteadiness on feet (R26.81);History of falling (Z91.81)  Time: 2979-8921 OT Time Calculation (min): 24 min Charges:  OT General Charges $OT Visit: 1 Visit OT Evaluation $OT Eval Moderate Complexity: 1 Mod OT Treatments $Self Care/Home Management : 8-22 mins  Matthew Folks, OTR/L [facility] [phone]

## 2020-12-16 NOTE — Consult Note (Signed)
   Heart Failure Nurse Navigator Note  HFpEF 60-65%.  Normal right ventricular systolic function.  Presented to the emergency room with complaints of worsening shortness of breath.  She noted this on exertion and when she lies flat.  BNP was 304  Comorbidities:  Anxiety/depression Hyperlipidemia COPD  Medications:  Aspirin 81 mg daily Furosemide 20 mg daily   Labs:  Sodium 130, potassium 3.9, chloride 82, CO2 37, BUN 11, creatinine 0.57 Intake 1080 mL Output 800 mL Weight not documented  Initial meeting with patient today.  She states [facility]  She does not use salt at the table and tries to abstain from salty foods.  Discussed daily weights and what to report to physician.  States in the past for 2 pound weight gain overnight or 5 pounds within a week she has called her physician but has not had problems like that in a long time.  She was given the living with heart failure teaching booklet along with his zone magnet and information on sodium.  He had no further question.  We will continue to follow along.  Tresa Endo RN [facility]

## 2020-12-16 NOTE — Progress Notes (Signed)
Physical Therapy Treatment Patient Details Name: [name] MRN: [id number] DOB: 30-Mar-1946 Today's Date: 12/16/2020    History of Present Illness Pt is a 75 y/o F admitted on 12/14/20 with c/c of SOB suspect 2/2 heart failure exacerbation. PMH: anxiety, HLD, hypothyroid, COPD, on 2L O2 at baseline, depression, tobacco abuse, DM, fibromyalgia, graves disease, IBS, HTN, spinal stenosis. Recent admission (July 2022) d/t displaced L olecranon fx and closed nondisplaced patella fracture that she sustained after a fall at home    PT Comments    Pt alert, agreeable to therapy. Pt requires min-guard for bed mobility w/ bed rails, but energy expenditure levels are high and pt notes increased SOB following mobility (HR 110, SpO2 92%) on oxygen. Min-A maybe necessary to decrease energy use for additional activities. 1 person hand held assist, min-guard for sit > stand. Pt demonstrates improvement in overall stability. Pt able to ambulated bed > chair with 1 person hand held assist, min-guard without LOB with step-to pattern. Primary limitation is decreased endurance and activity tolerance secondary to shortness of breath. Oxygen levels were > 92% throughout treatment. Skilled PT intervention is indicated to address deficits in function, mobility, and to return to PLOF as able.  Discharge recommendations are SNF.   Follow Up Recommendations  SNF     Equipment Recommendations  Other (comment) (TBD next venue of care)    Recommendations for Other Services       Precautions / Restrictions Precautions Precautions: Fall Restrictions Weight Bearing Restrictions: Yes LUE Weight Bearing: Non weight bearing RLE Weight Bearing: Weight bearing as tolerated    Mobility  Bed Mobility Overal bed mobility: Needs Assistance Bed Mobility: Supine to Sit     Supine to sit: HOB elevated;Min guard;Min assist     General bed mobility comments: Utilizes bed rails w/ min-gaurd w/ increased energy expediture;  min-A for energy reserve    Transfers Overall transfer level: Needs assistance Equipment used: 1 person hand held assist Transfers: Sit to/from Stand Sit to Stand: Min guard         General transfer comment: reaches towards chair hand rail for transfer  Ambulation/Gait Ambulation/Gait assistance: Min guard Gait Distance (Feet): 1 Feet Assistive device: 1 person hand held assist Gait Pattern/deviations: Step-to pattern     General Gait Details: Assist for safet and cues   Stairs             Wheelchair Mobility    Modified Rankin (Stroke Patients Only)       Balance Overall balance assessment: Needs assistance Sitting-balance support: Feet supported;Single extremity supported Sitting balance-Leahy Scale: Good Sitting balance - Comments: Good static sitting balance reaching within BOS   Standing balance support: Single extremity supported;During functional activity Standing balance-Leahy Scale: Fair Standing balance comment: Able to stand with min-gaurd w/o AD and reach outside BOS from bed > chair                            Cognition Arousal/Alertness: Awake/alert Behavior During Therapy: WFL for tasks assessed/performed Overall Cognitive Status: Within Functional Limits for tasks assessed                                        Exercises Other Exercises Other Exercises: Discussion regarding role of OT, POC, and d/c recommendations, with pt verbalizing understanding    General Comments General comments (skin integrity,  edema, etc.): Pt endorses some SOB but SpO2 >94%      Pertinent Vitals/Pain Pain Assessment: Faces Faces Pain Scale: Hurts little more Pain Location: LUE, RLE Pain Descriptors / Indicators: Aching;Grimacing Pain Intervention(s): Limited activity within patient's tolerance;Monitored during session;Repositioned    Home Living Family/patient expects to be discharged to:: Private residence   Available Help  at Discharge: Personal care attendant;Available PRN/intermittently (PCA assists 2hrs/week) Type of Home: Apartment Home Access: Elevator   Home Layout: One level Home Equipment: Emergency planning/management officer - 4 wheels Additional Comments: Pt recently discharged from SNF following recent admission following fall    Prior Function Level of Independence: Needs assistance  Gait / Transfers Assistance Needed: At baseline, pt utilizes rollator for household mobility, community distances requires assistance ADL's / Homemaking Assistance Needed: At baseline, pt mod-independent with BADLs. Utilizes some assistance for cleaning, cooking, intermittent bathing     PT Goals (current goals can now be found in the care plan section) Acute Rehab PT Goals Patient Stated Goal: to return to PLOF PT Goal Formulation: With patient Time For Goal Achievement: 12/29/20 Potential to Achieve Goals: Good Progress towards PT goals: Progressing toward goals    Frequency    Min 2X/week      PT Plan Current plan remains appropriate    Co-evaluation              AM-PAC PT "6 Clicks" Mobility   Outcome Measure  Help needed turning from your back to your side while in a flat bed without using bedrails?: A Little Help needed moving from lying on your back to sitting on the side of a flat bed without using bedrails?: A Little Help needed moving to and from a bed to a chair (including a wheelchair)?: A Little Help needed standing up from a chair using your arms (e.g., wheelchair or bedside chair)?: A Little Help needed to walk in hospital room?: A Lot Help needed climbing 3-5 steps with a railing? : Total 6 Click Score: 15    End of Session Equipment Utilized During Treatment: Gait belt Activity Tolerance: Other (comment) (Limited by SOB) Patient left: in chair;with call bell/phone within reach;with chair alarm set Nurse Communication: Mobility status PT Visit Diagnosis: Muscle weakness (generalized)  (M62.81);Difficulty in walking, not elsewhere classified (R26.2);Unsteadiness on feet (R26.81)     Time: 1610-9604 PT Time Calculation (min) (ACUTE ONLY): 18 min  Charges:                        [employer], SPT

## 2020-12-16 NOTE — NC FL2 (Addendum)
[place] MEDICAID FL2 LEVEL OF CARE SCREENING TOOL     IDENTIFICATION  Patient Name: [name] Birthdate: March 10, 1946 Sex: female Admission Date (Current Location): 12/14/2020  [facility] and [place] Number:      Facility and Address:  [facility], [address] [place], [place] [address]      Provider Number: [id number]  Attending Physician Name and Address:  Enedina Finner, MD  Relative Name and Phone Number:  Marinell Blight (aide) [phone]    Current Level of Care: Hospital Recommended Level of Care:   Prior Approval Number:    Date Approved/Denied:   PASRR Number:   [id number] Discharge Plan: SNF    Current Diagnoses: Patient Active Problem List   Diagnosis Date Noted   Acute exacerbation of CHF (congestive heart failure) (HCC) 12/14/2020   Patellar fracture 11/18/2020   Olecranon fracture 11/18/2020   Major depressive disorder, recurrent, in remission, unspecified (HCC) 04/23/2020   Pulmonary HTN (HCC)    Hypothyroidism, acquired 04/25/2019   Acute metabolic encephalopathy 03/19/2019   Confusion 03/17/2019   AMS (altered mental status) 03/12/2019   Acute encephalopathy 03/11/2019   Hypothyroidism 03/11/2019   Altered mental status, unspecified 03/11/2019   Medicare annual wellness visit, subsequent 06/13/2018   PSVT (paroxysmal supraventricular tachycardia) (HCC) 10/05/2017   Age-related osteoporosis without current pathological fracture 09/15/2017   Chronic obstructive pulmonary disease (HCC) 05/21/2017   Shortness of breath 04/05/2017   Bilateral carotid artery stenosis 03/17/2017   Hyperlipidemia, mixed 03/17/2017   Orthostatic hypotension 01/12/2017   Bronchitis 01/04/2017   Angina pectoris (HCC) 06/13/2015   UTI (lower urinary tract infection) 06/13/2015   Degenerative disc disease, lumbar 10/18/2014   Sacroiliac joint dysfunction 10/18/2014   Facet syndrome, lumbar 10/18/2014   Cervical facet joint syndrome 10/18/2014    Bilateral occipital neuralgia 10/18/2014   Migraine 10/18/2014   Neurosis, posttraumatic 10/08/2014   Posttraumatic stress disorder 10/08/2014   Spinal stenosis    PTSD (post-traumatic stress disorder)     Orientation RESPIRATION BLADDER Height & Weight     Self, Time, Situation, Place  O2 (2L nasal cannula) Continent Weight: 141 lb 15.6 oz (64.4 kg) Height:  5\' 6"  (167.6 cm)  BEHAVIORAL SYMPTOMS/MOOD NEUROLOGICAL BOWEL NUTRITION STATUS      Continent Diet (see discharge summary)  AMBULATORY STATUS COMMUNICATION OF NEEDS Skin   Extensive Assist Verbally Other (Comment) (left leg abrasions)                       Personal Care Assistance Level of Assistance  Bathing, Dressing, Feeding, Total care Bathing Assistance: Limited assistance Feeding assistance: Independent Dressing Assistance: Limited assistance Total Care Assistance: Maximum assistance   Functional Limitations Info  Sight, Hearing, Speech Sight Info: Adequate Hearing Info: Adequate Speech Info: Adequate    SPECIAL CARE FACTORS FREQUENCY  PT (By licensed PT), OT (By licensed OT)     PT Frequency: min 4x weekly OT Frequency: min 4x weekly            Contractures Contractures Info: Not present    Additional Factors Info  Code Status, Allergies Code Status Info: full Allergies Info: Celecoxib, Pregabalin           Current Medications (12/16/2020):  This is the current hospital active medication list Current Facility-Administered Medications  Medication Dose Route Frequency Provider Last Rate Last Admin   acetaminophen (TYLENOL) tablet 650 mg  650 mg Oral Q6H PRN Cox, Amy N, DO       Or  acetaminophen (TYLENOL) suppository 650 mg  650 mg Rectal Q6H PRN Cox, Amy N, DO       albuterol (PROVENTIL) (2.5 MG/3ML) 0.083% nebulizer solution 2.5 mg  2.5 mg Inhalation Q2H PRN Cox, Amy N, DO       aspirin EC tablet 81 mg  81 mg Oral Daily Cox, Amy N, DO   81 mg at 12/16/20 4034   cholecalciferol (VITAMIN D3)  tablet 2,000 Units  2,000 Units Oral Daily Enedina Finner, MD   2,000 Units at 12/16/20 0957   enoxaparin (LOVENOX) injection 40 mg  40 mg Subcutaneous Q24H Cox, Amy N, DO   40 mg at 12/15/20 2141   FLUoxetine (PROZAC) capsule 40 mg  40 mg Oral Daily Cox, Amy N, DO   40 mg at 12/16/20 0957   fluticasone (FLONASE) 50 MCG/ACT nasal spray 2 spray  2 spray Each Nare Daily Cox, Amy N, DO   2 spray at 12/16/20 7425   furosemide (LASIX) injection 40 mg  40 mg Intravenous Daily Enedina Finner, MD   40 mg at 12/16/20 9563   guaiFENesin (MUCINEX) 12 hr tablet 600 mg  600 mg Oral Q12H PRN Cox, Amy N, DO   600 mg at 12/15/20 0006   hydrALAZINE (APRESOLINE) tablet 10 mg  10 mg Oral Q6H PRN Cox, Amy N, DO       hydrOXYzine (ATARAX/VISTARIL) tablet 10 mg  10 mg Oral TID PRN Enedina Finner, MD       ipratropium-albuterol (DUONEB) 0.5-2.5 (3) MG/3ML nebulizer solution 3 mL  3 mL Nebulization TID Cox, Amy N, DO   3 mL at 12/16/20 0756   levothyroxine (SYNTHROID) tablet 88 mcg  88 mcg Oral Q0600 Cox, Amy N, DO   88 mcg at 12/16/20 0533   LORazepam (ATIVAN) tablet 1 mg  1 mg Oral QHS PRN Cox, Amy N, DO   1 mg at 12/15/20 2141   MEDLINE mouth rinse  15 mL Mouth Rinse BID Cox, Amy N, DO   15 mL at 12/16/20 8756   mometasone-formoterol (DULERA) 200-5 MCG/ACT inhaler 2 puff  2 puff Inhalation BID Cox, Amy N, DO   2 puff at 12/16/20 4332   multivitamin with minerals tablet 1 tablet  1 tablet Oral Daily Enedina Finner, MD   1 tablet at 12/16/20 0957   ondansetron (ZOFRAN) tablet 4 mg  4 mg Oral Q6H PRN Cox, Amy N, DO       Or   ondansetron (ZOFRAN) injection 4 mg  4 mg Intravenous Q6H PRN Cox, Amy N, DO       rosuvastatin (CRESTOR) tablet 20 mg  20 mg Oral QHS Cox, Amy N, DO   20 mg at 12/15/20 2141   tiotropium (SPIRIVA) inhalation capsule ([facility] use ONLY) 18 mcg  18 mcg Inhalation Daily Enedina Finner, MD   18 mcg at 12/16/20 9518   vitamin B-12 (CYANOCOBALAMIN) tablet 1,000 mcg  1,000 mcg Oral Daily Cox, Amy N, DO   1,000 mcg at  12/16/20 8416     Discharge Medications: Please see discharge summary for a list of discharge medications.  Relevant Imaging Results:  Relevant Lab Results:   Additional Information SSN: [id number]  Gildardo Griffes, LCSW

## 2020-12-17 DIAGNOSIS — E782 Mixed hyperlipidemia: Secondary | ICD-10-CM

## 2020-12-17 DIAGNOSIS — F419 Anxiety disorder, unspecified: Secondary | ICD-10-CM

## 2020-12-17 DIAGNOSIS — J449 Chronic obstructive pulmonary disease, unspecified: Secondary | ICD-10-CM

## 2020-12-17 DIAGNOSIS — E039 Hypothyroidism, unspecified: Secondary | ICD-10-CM

## 2020-12-17 DIAGNOSIS — R0602 Shortness of breath: Secondary | ICD-10-CM | POA: Diagnosis not present

## 2020-12-17 DIAGNOSIS — I509 Heart failure, unspecified: Secondary | ICD-10-CM

## 2020-12-17 MED ORDER — FUROSEMIDE 20 MG PO TABS: 20.0000 mg | ORAL_TABLET | ORAL | 0 refills | Status: DC

## 2020-12-17 NOTE — Progress Notes (Signed)
[facility]  - [facility]   PATIENT NAME: [name]    MR#:  [id number]  DATE OF BIRTH:  [birth year]  SUBJECTIVE:  patient has history of chronic anxiety. Sats on 99-100% on 2 L. No family at bedside. Overall appears at baseline. No new complaints Has chronic anxiety  REVIEW OF SYSTEMS:   Review of Systems  Constitutional:  Negative for chills, fever and weight loss.  HENT:  Negative for ear discharge, ear pain and nosebleeds.   Eyes:  Negative for blurred vision, pain and discharge.  Respiratory:  Positive for shortness of breath. Negative for sputum production, wheezing and stridor.   Cardiovascular:  Negative for chest pain, palpitations, orthopnea and PND.  Gastrointestinal:  Negative for abdominal pain, diarrhea, nausea and vomiting.  Genitourinary:  Negative for frequency and urgency.  Musculoskeletal:  Negative for back pain and joint pain.  Neurological:  Positive for weakness. Negative for sensory change, speech change and focal weakness.  Psychiatric/Behavioral:  Negative for depression and hallucinations. The patient is nervous/anxious.   Tolerating Diet:yes Tolerating PT: SNF  DRUG ALLERGIES:   Allergies  Allergen Reactions   Celecoxib     Other reaction(s): Other (qualifier value) Patient is allergic to Celebrex and found that it caused heart failure. CHF   Pregabalin     Other reaction(s): Localized superficial swelling of skin    VITALS:  Blood pressure (!) 156/73, pulse 87, temperature 98.2 F (36.8 C), resp. rate 18, height 5\' 6"  (1.676 m), weight 64.4 kg, SpO2 96 %.  PHYSICAL EXAMINATION:   Physical Exam  GENERAL:  75 y.o.-year-old patient lying in the bed with no acute distress.  LUNGS: decrease breath sounds bilaterally, no wheezing, rales, rhonchi. No use of accessory muscles of respiration.  CARDIOVASCULAR: S1, S2 normal. No murmurs, rubs, or gallops.  ABDOMEN: Soft, nontender, nondistended. Bowel sounds present. No  organomegaly or mass.  EXTREMITIES: No cyanosis, clubbing or edema b/l.   Brace left arm and right knee  NEUROLOGIC: non focal PSYCHIATRIC:  patient is alert and anxious SKIN: No obvious rash, lesion, or ulcer.   LABORATORY PANEL:  CBC Recent Labs  Lab 12/15/20 0552  WBC 2.7*  HGB 12.1  HCT 38.6  PLT 228     Chemistries  Recent Labs  Lab 12/14/20 1707 12/15/20 0552  NA 128* 130*  K 4.0 3.9  CL 86* 82*  CO2 38* 37*  GLUCOSE 160* 127*  BUN 12 11  CREATININE 0.42* 0.56  CALCIUM 8.8* 9.4  AST 23  --   ALT 12  --   ALKPHOS 51  --   BILITOT 0.6  --     Cardiac Enzymes No results for input(s): TROPONINI in the last 168 hours. RADIOLOGY:  ECHOCARDIOGRAM COMPLETE  Result Date: 12/15/2020    ECHOCARDIOGRAM REPORT   Patient Name:   [name] Date of Exam: 12/15/2020 Medical Rec #:  12/17/2020     Height:       66.0 in Accession #:    [id number]    Weight:       142.0 lb Date of Birth:  1946/04/05     BSA:          1.729 m Patient Age:    75 years      BP:           178/74 mmHg Patient Gender: F             HR:  104 bpm. Exam Location:  [facility] Procedure: 2D Echo, Cardiac Doppler and Color Doppler Indications:     Dyspnea R06.00  History:         Patient has prior history of Echocardiogram examinations. CHF;                  Risk Factors:Diabetes.  Sonographer:     Neysa Bonito Roar Referring Phys:  [id number] AMY N COX Diagnosing Phys: Julien Nordmann MD IMPRESSIONS  1. Left ventricular ejection fraction, by estimation, is 60 to 65%. The left ventricle has normal function. The left ventricle has no regional wall motion abnormalities. Left ventricular diastolic parameters were normal.  2. Right ventricular systolic function is normal. The right ventricular size is normal.  3. The mitral valve is normal in structure. Mild mitral valve regurgitation. FINDINGS  Left Ventricle: Left ventricular ejection fraction, by estimation, is 60 to 65%. The left ventricle has normal function. The left  ventricle has no regional wall motion abnormalities. The left ventricular internal cavity size was normal in size. There is  no left ventricular hypertrophy. Left ventricular diastolic parameters were normal. Right Ventricle: The right ventricular size is normal. No increase in right ventricular wall thickness. Right ventricular systolic function is normal. Left Atrium: Left atrial size was normal in size. Right Atrium: Right atrial size was normal in size. Pericardium: There is no evidence of pericardial effusion. Mitral Valve: The mitral valve is normal in structure. Mild mitral valve regurgitation. No evidence of mitral valve stenosis. Tricuspid Valve: The tricuspid valve is normal in structure. Tricuspid valve regurgitation is not demonstrated. No evidence of tricuspid stenosis. Aortic Valve: The aortic valve is normal in structure. Aortic valve regurgitation is not visualized. No aortic stenosis is present. Aortic valve peak gradient measures 9.7 mmHg. Pulmonic Valve: The pulmonic valve was normal in structure. Pulmonic valve regurgitation is not visualized. No evidence of pulmonic stenosis. Aorta: The aortic root is normal in size and structure. Venous: The inferior vena cava is normal in size with greater than 50% respiratory variability, suggesting right atrial pressure of 3 mmHg. IAS/Shunts: No atrial level shunt detected by color flow Doppler.  LEFT VENTRICLE PLAX 2D LVIDd:         3.62 cm  Diastology LVIDs:         2.53 cm  LV e' medial:    10.30 cm/s LV PW:         0.86 cm  LV E/e' medial:  17.6 LV IVS:        0.68 cm  LV e' lateral:   14.50 cm/s LVOT diam:     1.80 cm  LV E/e' lateral: 12.5 LVOT Area:     2.54 cm  RIGHT VENTRICLE RV Mid diam:    2.96 cm RV S prime:     13.40 cm/s TAPSE (M-mode): 2.3 cm LEFT ATRIUM             Index       RIGHT ATRIUM           Index LA diam:        3.60 cm 2.08 cm/m  RA Area:     10.80 cm LA Vol (A2C):   59.3 ml 34.30 ml/m RA Volume:   22.60 ml  13.07 ml/m LA Vol  (A4C):   50.1 ml 28.98 ml/m LA Biplane Vol: 55.1 ml 31.87 ml/m  AORTIC VALVE                PULMONIC VALVE AV Area (  Vmax): 2.04 cm    PV Vmax:        1.11 m/s AV Vmax:        156.00 cm/s PV Peak grad:   4.9 mmHg AV Peak Grad:   9.7 mmHg    RVOT Peak grad: 4 mmHg LVOT Vmax:      125.00 cm/s  AORTA Ao Root diam: 2.30 cm MITRAL VALVE MV Area (PHT): 4.41 cm     SHUNTS MV Decel Time: 172 msec     Systemic Diam: 1.80 cm MV E velocity: 181.00 cm/s MV A velocity: 155.00 cm/s MV E/A ratio:  1.17 MV A Prime:    10.2 cm/s Julien Nordmann MD Electronically signed by Julien Nordmann MD Signature Date/Time: 12/15/2020/4:06:02 PM    Final    ASSESSMENT AND PLAN:   [name] is a 75 y.o. female with medical history significant for anxiety, hyperlipidemia, hypothyroid, history of COPD, chronic respiratory failure requiring 2 L nasal cannula baseline, depression, history of tobacco use, presents to the emergency department for chief concerns of shortness of breath.  She reports the shortness of breath is worse when she lays flat and with exertion  # Shortness of breath suspect secondary to heart failure exacerbation, diastolic acute on chronic - BNP was elevated at 304 - Lasix 40 mg IV twice daily, 3 doses ordered--change to oral lasix 20 mg qd - DuoNebs 3 times daily scheduled - Strict I's and O's - CTA chest was read as negative for pulmonary embolism.  Bilateral trace small volume pleural effusion, right greater than left.  Enlarged main pulmonary artery suggestive of pulmonary hypertension. -- Patient uses chronic oxygen 2 L nasal cannula. Sats 99%  # Mild hyponatremia - suspect this is secondary to hypotonic in setting of volume overload - Treat as above with Lasix - sodium improving   # Pulmonary hypertension-CPAP nightly ordered   # COPD/bronchitis-secondary to history of tobacco use, resume home inhalers including DuoNeb scheduled   # Hypothyroid-resumed levothyroxine   # Depression/anxiety-resume  fluoxetine - Ativan 1 mg as needed nightly for sleep and anxiety  # Hyperlipidemia-rosuvastatin   # History of left olecranon fracture secondary to mechanical fall status post ORIF-- PT OT recommends rehab  TOC for d/c planning--awaiting rehab bed. Medically ok to d/c    Family communication : spoke with Marcelino Duster daughter on the phone 7/31 Consults : CODE STATUS: full DVT Prophylaxis : enoxaparin Level of care: Med-Surg Status is: inpateint    Dispo: The patient is from: Home              Anticipated d/c is to:SNF once bed available.              Patient currently is medically stable to d/c.   Difficult to place patient No   TOC aware. Patient will discharged once bed available.     TOTAL TIME TAKING CARE OF THIS PATIENT: 25 minutes.  >50% time spent on counselling and coordination of care  Note: This dictation was prepared with Dragon dictation along with smaller phrase technology. Any transcriptional errors that result from this process are unintentional.  Enedina Finner M.D    [facility]   CC: Primary care physician; Lyndon Code, MD Patient ID: [name], female   DOB: [birth year], 75 y.o.   MRN: [id number]

## 2020-12-17 NOTE — Progress Notes (Addendum)
Pt made aware of discharge to home with home health and agrees. Discharge instructions explained/pt verbalized understanding. IV and tele removed. EMS to transport home.    UPDATE: 1444: EMS arrived for transport. Qu[place], home health aide, made aware transport and he will meet her at home.

## 2020-12-17 NOTE — Progress Notes (Signed)
CSW received a call from EMS stating patient was "bed bound". CSW informed them this was not noted in our assessment of patient by MD, PT or OT. They also report patient needs 24/7 care and supervision, CSW informed them this was also  not noted. CSW explained to EMS that patient was unable to go to SNF as she was unable to afford the copay of $200/day, therefore patient was agreeable to go home with hh services in addition to her aid and custodial care by [facility].   CSW also called TOC Supervisor to inform of above communication with EMS. Supervisor will follow up with EMS regarding this, as patient was agreeable to this dc plan and no other dc options at this time. Patient has been medically stable and cleared with no need to admit back to hospital.   Angeline Slim, LCSW [phone]

## 2020-12-17 NOTE — TOC Transition Note (Addendum)
Transition of Care [facility]) - CM/SW Discharge Note   Patient Details  Name: [name] MRN: [id number] Date of Birth: 1945-06-29  [facility] CM/SW Contact:  Gildardo Griffes, LCSW Phone Number: 12/17/2020, 11:35 AM   Clinical Narrative:      Update: Patient active with [facility], will resume HH services for PT, OT, RN and social worker. [place] with [facility] informed of dc today. Patient asked CSW to call her aide Verlon Au to inform of dc, Verlon Au was extremely abrasive and aggressive over the phone, CSW informed her if she continue to speak this way CSW would hang upf. Verlon Au continued and CSW had to end the conversation. CSW informed Christeena of this, reports this has been a problem with Verlon Au and she apologized. CSW called [place] her case manager with [facility], reports patient can have EMS home and has a key box on the door. Reports he will meet her at home and requested that RN call him when EMS arrives to transport.        CSW spoke with patient regarding SNF recommendation in that he has already used 18 of her days covered by insurance, therefore she would have a co pay after 2 days which equals around $200 per day, with SNFs requiring at least 1 weeks payment upfront. Patient reports she's unable to afford this and agreeable to return home. Patient's [facility] team updated, they report patient is already receiving home services and personal care services, they are following up to ensure they can do PT/OT as well and will call CSW back.     Final next level of care: Home w Home Health Services Barriers to Discharge: No Barriers Identified   Patient Goals and CMS Choice Patient states their goals for this hospitalization and ongoing recovery are:: to go home CMS Medicare.gov Compare Post Acute Care list provided to:: Patient Choice offered to / list presented to : Patient  Discharge Placement                    Patient and family notified of of transfer:  12/17/20  Discharge Plan and Services                                     Social Determinants of Health (SDOH) Interventions     Readmission Risk Interventions No flowsheet data found.

## 2020-12-17 NOTE — Discharge Summary (Signed)
[facility] [place] at [facility]   PATIENT NAME: [name]    MR#:  [id number]  DATE OF BIRTH:  12/09/1945  DATE OF ADMISSION:  12/14/2020 ADMITTING PHYSICIAN: Amy N Cox, DO  DATE OF DISCHARGE: 12/17/2020  PRIMARY CARE PHYSICIAN: Lyndon CodeKhan, Fozia M, MD    ADMISSION DIAGNOSIS:  Shortness of breath [R06.02] Sinus tachycardia [R00.0] Pain [R52] Pulmonary hypertension (HCC) [I27.20] COPD exacerbation (HCC) [J44.1] Acute exacerbation of CHF (congestive heart failure) (HCC) [I50.9]  DISCHARGE DIAGNOSIS:  acute on chronic diastolic congestive heart chronic anxiety  SECONDARY DIAGNOSIS:   Past Medical History:  Diagnosis Date  . Anxiety   . Chest pain   . CHF (congestive heart failure) (HCC)   . COPD (chronic obstructive pulmonary disease) (HCC)   . Depression   . Diabetes mellitus without complication (HCC)   . Fibromyalgia   . Graves disease   . Hypertension   . IBS (irritable bowel syndrome)   . PTSD (post-traumatic stress disorder)   . Sinus problem   . Spinal stenosis     HOSPITAL COURSE:   [name] is a 75 y.o. female with medical history significant for anxiety, hyperlipidemia, hypothyroid, history of COPD, chronic respiratory failure requiring 2 L nasal cannula baseline, depression, history of tobacco use, presents to the emergency department for chief concerns of shortness of breath.  She reports the shortness of breath is worse when she lays flat and with exertion   # Shortness of breath suspect secondary to heart failure exacerbation, diastolic acute on chronic - BNP was elevated at 304 - Lasix 40 mg IV twice daily, 3 doses ordered--change to oral lasix 20 mg qod - DuoNebs 3 times daily scheduled - Strict I's and O's - CTA chest was read as negative for pulmonary embolism.  Bilateral trace small volume pleural effusion, right greater than left.  Enlarged main pulmonary artery suggestive of pulmonary hypertension. -- Patient uses chronic  oxygen 2 L nasal cannula. Sats 99%   # Mild hyponatremia - suspect this is secondary to hypotonic in setting of volume overload - Treat as above with Lasix - sodium improving   # Pulmonary hypertension-CPAP nightly ordered   # COPD/bronchitis-secondary to history of tobacco use, resume home inhalers including DuoNeb scheduled   # Hypothyroid-resumed levothyroxine   # Depression/anxiety-resume fluoxetine - Ativan 1 mg as needed nightly for sleep and anxiety  # Hyperlipidemia-rosuvastatin  # History of left olecranon fracture secondary to mechanical fall status post ORIF-- PT OT recommends rehab    8/2--per TOC pt is unable to co-pay for rehab. She will d/c home with option of her home health services       Family communication : spoke with Marcelino DusterMichelle daughter on the phone 7/31 None today. Pt well informed Consults : CODE STATUS: full DVT Prophylaxis : enoxaparin Level of care: Med-Surg Status is: inpateint       Dispo: The patient is from: Home              Anticipated d/c is VH:QIONto:home.              Patient currently is medically stable to d/c.              Difficult to place patient No          CONSULTS OBTAINED:    DRUG ALLERGIES:   Allergies  Allergen Reactions  . Celecoxib     Other reaction(s): Other (qualifier value) Patient is allergic to Celebrex and found that  it caused heart failure. CHF  . Pregabalin     Other reaction(s): Localized superficial swelling of skin    DISCHARGE MEDICATIONS:   Allergies as of 12/17/2020       Reactions   Celecoxib    Other reaction(s): Other (qualifier value) Patient is allergic to Celebrex and found that it caused heart failure. CHF   Pregabalin    Other reaction(s): Localized superficial swelling of skin        Medication List     STOP taking these medications    traMADol 50 MG tablet Commonly known as: ULTRAM       TAKE these medications    acetaminophen 500 MG tablet Commonly known as:  TYLENOL Take 1,000 mg by mouth in the morning, at noon, and at bedtime.   albuterol 108 (90 Base) MCG/ACT inhaler Commonly known as: VENTOLIN HFA Inhale 2 puffs into the lungs every 6 (six) hours as needed for wheezing or shortness of breath.   albuterol 108 (90 Base) MCG/ACT inhaler Commonly known as: ProAir HFA INHALE 2 PUFFS INTO LUNGS EVERY 6 HOURS AS NEEDED FOR WHEEZING OR SHORTNESS OF BREATH   alendronate 70 MG tablet Commonly known as: FOSAMAX Take 70 mg by mouth every Tuesday.   Amitiza 8 MCG capsule Generic drug: lubiprostone Take 8 mcg by mouth daily with breakfast.   aspirin EC 81 MG tablet Take 81 mg by mouth daily. Swallow whole.   Azelastine HCl 137 MCG/SPRAY Soln Place 2 sprays into both nostrils in the morning and at bedtime.   Ergocalciferol 50 MCG (2000 UT) Tabs Take 2 capsules by mouth daily.   FLUoxetine 20 MG capsule Commonly known as: PROZAC Take 2 capsules (40 mg total) by mouth daily.   fluticasone 50 MCG/ACT nasal spray Commonly known as: FLONASE Place 2 sprays into both nostrils in the morning and at bedtime.   furosemide 20 MG tablet Commonly known as: LASIX Take 1 tablet (20 mg total) by mouth every other day.   guaifenesin 400 MG Tabs tablet Commonly known as: HUMIBID E Take 800 mg by mouth every 6 (six) hours as needed (congestion/cough).   HYDROcodone-acetaminophen 5-325 MG tablet Commonly known as: NORCO/VICODIN Take 1-2 tablets by mouth every 4 (four) hours as needed for moderate pain.   hydrocortisone 2.5 % cream Place 1 application rectally 2 (two) times daily as needed (itching/hemorroids).   ipratropium-albuterol 0.5-2.5 (3) MG/3ML Soln Commonly known as: DUONEB One vial 3 x  aday for copd with hypoxia   levothyroxine 88 MCG tablet Commonly known as: SYNTHROID Take 88 mcg by mouth daily before breakfast. Take 30 minutes before food and with a full glass of water   LORazepam 1 MG tablet Commonly known as: ATIVAN Take 1  tablet (1 mg total) by mouth at bedtime.   multivitamin with minerals Tabs tablet Take 1 tablet by mouth daily. Multivitamin for Seniors   ondansetron 4 MG tablet Commonly known as: ZOFRAN Take 1 tablet (4 mg total) by mouth every 6 (six) hours as needed for nausea.   rosuvastatin 20 MG tablet Commonly known as: Crestor Take 1 tablet (20 mg total) by mouth daily.   Symbicort 160-4.5 MCG/ACT inhaler Generic drug: budesonide-formoterol Inhale 2 puffs into the lungs 2 (two) times daily.   tiotropium 18 MCG inhalation capsule Commonly known as: SPIRIVA Place 18 mcg into inhaler and inhale in the morning and at bedtime.   Vitamin B-12 1000 MCG/15ML Liqd Take 1,200 mcg by mouth daily.  If you experience worsening of your admission symptoms, develop shortness of breath, life threatening emergency, suicidal or homicidal thoughts you must seek medical attention immediately by calling 911 or calling your MD immediately  if symptoms less severe.  You Must read complete instructions/literature along with all the possible adverse reactions/side effects for all the Medicines you take and that have been prescribed to you. Take any new Medicines after you have completely understood and accept all the possible adverse reactions/side effects.   Please note  You were cared for by a hospitalist during your hospital stay. If you have any questions about your discharge medications or the care you received while you were in the hospital after you are discharged, you can call the unit and asked to speak with the hospitalist on call if the hospitalist that took care of you is not available. Once you are discharged, your primary care physician will handle any further medical issues. Please note that NO REFILLS for any discharge medications will be authorized once you are discharged, as it is imperative that you return to your primary care physician (or establish a relationship with a primary care  physician if you do not have one) for your aftercare needs so that they can reassess your need for medications and monitor your lab values.  DATA REVIEW:   CBC  Recent Labs  Lab 12/15/20 0552  WBC 2.7*  HGB 12.1  HCT 38.6  PLT 228    Chemistries  Recent Labs  Lab 12/14/20 1707 12/15/20 0552  NA 128* 130*  K 4.0 3.9  CL 86* 82*  CO2 38* 37*  GLUCOSE 160* 127*  BUN 12 11  CREATININE 0.42* 0.56  CALCIUM 8.8* 9.4  AST 23  --   ALT 12  --   ALKPHOS 51  --   BILITOT 0.6  --     Microbiology Results   Recent Results (from the past 240 hour(s))  Resp Panel by RT-PCR (Flu A&B, Covid) Nasopharyngeal Swab     Status: None   Collection Time: 12/14/20  5:07 PM   Specimen: Nasopharyngeal Swab; Nasopharyngeal(NP) swabs in vial transport medium  Result Value Ref Range Status   SARS Coronavirus 2 by RT PCR NEGATIVE NEGATIVE Final    Comment: (NOTE) SARS-CoV-2 target nucleic acids are NOT DETECTED.  The SARS-CoV-2 RNA is generally detectable in upper respiratory specimens during the acute phase of infection. The lowest concentration of SARS-CoV-2 viral copies this assay can detect is 138 copies/mL. A negative result does not preclude SARS-Cov-2 infection and should not be used as the sole basis for treatment or other patient management decisions. A negative result may occur with  improper specimen collection/handling, submission of specimen other than nasopharyngeal swab, presence of viral mutation(s) within the areas targeted by this assay, and inadequate number of viral copies(<138 copies/mL). A negative result must be combined with clinical observations, patient history, and epidemiological information. The expected result is Negative.  Fact Sheet for Patients:  BloggerCourse.com  Fact Sheet for Healthcare Providers:  SeriousBroker.it  This test is no t yet approved or cleared by the [place] FDA and  has been  authorized for detection and/or diagnosis of SARS-CoV-2 by FDA under an Emergency Use Authorization (EUA). This EUA will remain  in effect (meaning this test can be used) for the duration of the COVID-19 declaration under Section 564(b)(1) of the Act, 21 U.S.C.section 360bbb-3(b)(1), unless the authorization is terminated  or revoked sooner.       Influenza A  by PCR NEGATIVE NEGATIVE Final   Influenza B by PCR NEGATIVE NEGATIVE Final    Comment: (NOTE) The Xpert Xpress SARS-CoV-2/FLU/RSV plus assay is intended as an aid in the diagnosis of influenza from Nasopharyngeal swab specimens and should not be used as a sole basis for treatment. Nasal washings and aspirates are unacceptable for Xpert Xpress SARS-CoV-2/FLU/RSV testing.  Fact Sheet for Patients: BloggerCourse.com  Fact Sheet for Healthcare Providers: SeriousBroker.it  This test is not yet approved or cleared by the [place] FDA and has been authorized for detection and/or diagnosis of SARS-CoV-2 by FDA under an Emergency Use Authorization (EUA). This EUA will remain in effect (meaning this test can be used) for the duration of the COVID-19 declaration under Section 564(b)(1) of the Act, 21 U.S.C. section 360bbb-3(b)(1), unless the authorization is terminated or revoked.  Performed at [facility], [address] [place] [place], [place] [address]     RADIOLOGY:  ECHOCARDIOGRAM COMPLETE  Result Date: 12/15/2020    ECHOCARDIOGRAM REPORT   Patient Name:   [name] Date of Exam: 12/15/2020 Medical Rec #:  [id number]     Height:       66.0 in Accession #:    [id number]    Weight:       142.0 lb Date of Birth:  1946-04-21     BSA:          1.729 m Patient Age:    74 years      BP:           178/74 mmHg Patient Gender: F             HR:           104 bpm. Exam Location:  [facility] Procedure: 2D Echo, Cardiac Doppler and Color Doppler Indications:     Dyspnea R06.00   History:         Patient has prior history of Echocardiogram examinations. CHF;                  Risk Factors:Diabetes.  Sonographer:     Neysa Bonito Roar Referring Phys:  [id number] AMY N COX Diagnosing Phys: Julien Nordmann MD IMPRESSIONS  1. Left ventricular ejection fraction, by estimation, is 60 to 65%. The left ventricle has normal function. The left ventricle has no regional wall motion abnormalities. Left ventricular diastolic parameters were normal.  2. Right ventricular systolic function is normal. The right ventricular size is normal.  3. The mitral valve is normal in structure. Mild mitral valve regurgitation. FINDINGS  Left Ventricle: Left ventricular ejection fraction, by estimation, is 60 to 65%. The left ventricle has normal function. The left ventricle has no regional wall motion abnormalities. The left ventricular internal cavity size was normal in size. There is  no left ventricular hypertrophy. Left ventricular diastolic parameters were normal. Right Ventricle: The right ventricular size is normal. No increase in right ventricular wall thickness. Right ventricular systolic function is normal. Left Atrium: Left atrial size was normal in size. Right Atrium: Right atrial size was normal in size. Pericardium: There is no evidence of pericardial effusion. Mitral Valve: The mitral valve is normal in structure. Mild mitral valve regurgitation. No evidence of mitral valve stenosis. Tricuspid Valve: The tricuspid valve is normal in structure. Tricuspid valve regurgitation is not demonstrated. No evidence of tricuspid stenosis. Aortic Valve: The aortic valve is normal in structure. Aortic valve regurgitation is not visualized. No aortic stenosis is present. Aortic valve peak gradient measures 9.7 mmHg. Pulmonic Valve:  The pulmonic valve was normal in structure. Pulmonic valve regurgitation is not visualized. No evidence of pulmonic stenosis. Aorta: The aortic root is normal in size and structure. Venous: The  inferior vena cava is normal in size with greater than 50% respiratory variability, suggesting right atrial pressure of 3 mmHg. IAS/Shunts: No atrial level shunt detected by color flow Doppler.  LEFT VENTRICLE PLAX 2D LVIDd:         3.62 cm  Diastology LVIDs:         2.53 cm  LV e' medial:    10.30 cm/s LV PW:         0.86 cm  LV E/e' medial:  17.6 LV IVS:        0.68 cm  LV e' lateral:   14.50 cm/s LVOT diam:     1.80 cm  LV E/e' lateral: 12.5 LVOT Area:     2.54 cm  RIGHT VENTRICLE RV Mid diam:    2.96 cm RV S prime:     13.40 cm/s TAPSE (M-mode): 2.3 cm LEFT ATRIUM             Index       RIGHT ATRIUM           Index LA diam:        3.60 cm 2.08 cm/m  RA Area:     10.80 cm LA Vol (A2C):   59.3 ml 34.30 ml/m RA Volume:   22.60 ml  13.07 ml/m LA Vol (A4C):   50.1 ml 28.98 ml/m LA Biplane Vol: 55.1 ml 31.87 ml/m  AORTIC VALVE                PULMONIC VALVE AV Area (Vmax): 2.04 cm    PV Vmax:        1.11 m/s AV Vmax:        156.00 cm/s PV Peak grad:   4.9 mmHg AV Peak Grad:   9.7 mmHg    RVOT Peak grad: 4 mmHg LVOT Vmax:      125.00 cm/s  AORTA Ao Root diam: 2.30 cm MITRAL VALVE MV Area (PHT): 4.41 cm     SHUNTS MV Decel Time: 172 msec     Systemic Diam: 1.80 cm MV E velocity: 181.00 cm/s MV A velocity: 155.00 cm/s MV E/A ratio:  1.17 MV A Prime:    10.2 cm/s Julien Nordmann MD Electronically signed by Julien Nordmann MD Signature Date/Time: 12/15/2020/4:06:02 PM    Final      CODE STATUS:     Code Status Orders  (From admission, onward)           Start     Ordered   12/14/20 2005  Full code  Continuous        12/14/20 2006           Code Status History     Date Active Date Inactive Code Status Order ID Comments User Context   11/18/2020 2027 11/21/2020 2000 Full Code [id number]  CoxNadyne Coombes, DO ED   04/08/2020 1129 04/08/2020 1843 Full Code [id number]  Iran Ouch, MD Inpatient   03/17/2019 1831 03/23/2019 2136 Full Code [id number]  Enedina Finner, MD Inpatient   03/11/2019 0725 03/14/2019  1843 Full Code [id number]  Mansy, Vernetta Honey, MD ED   01/12/2017 1843 01/13/2017 1819 Full Code [id number]  Alford Highland, MD ED   01/04/2017 0215 01/04/2017 2232 Full Code [id number]  Hugelmeyer, Alexis, DO Inpatient   06/13/2015 2128 06/14/2015 1950 Full Code  [id number]  Adrian Saran, MD Inpatient        TOTAL TIME TAKING CARE OF THIS PATIENT: 35 minutes.    Enedina Finner M.D  [facility]    CC: Primary care physician; Lyndon Code, MD

## 2020-12-18 ENCOUNTER — Other Ambulatory Visit: Payer: Self-pay

## 2020-12-18 DIAGNOSIS — I5033 Acute on chronic diastolic (congestive) heart failure: Secondary | ICD-10-CM

## 2020-12-18 NOTE — TOC Transition Note (Addendum)
Transition of Care [facility]) - CM/SW Discharge Note   Patient Details  Name: [name] MRN: [id number] Date of Birth: 06/18/1945  [facility] CM/SW Contact:   Cellar, RN Phone Number: 12/18/2020, 9:39 AM   Clinical Narrative:    Received call from [facility] LCSW reporting Rayetta Humphrey with EMS had contacted her and reported EMS would be returning patient to hospital due to patient not having anyone at home to assist her.   RN CM contacted Rayetta Humphrey, EMS [phone] and discussed concerns related to patient discharge. Confirmed patient had aide services daily and had worked with PT/OT in preparation for discharge home with hhc. Patient had requested home with hhc due to cost of continued SNF stay. Also confirmed that [facility] was aware of patient discharging home. Maralyn Sago reported they were bringing patient back at the request of her supervisor, Clydene Pugh.   RN CM contacted Darcel Smalling supervisor @ [phone] and updated on discharge plan already engaged and reasoning behind dc plan. Discussed EMS concerns and addressed all measures taken to prevent readmission and falls. Caryn Bee agreed and confirmed EMS would take patient back home and assist into home.   RN CM received call from [facility] ED RN 10-15 minutes after speaking with Caryn Bee who reported patient was headed back to the ED per EMS report. Writer confirmed that situation had been handled and patient was to be discharged back to her home with prearranged discharge plan.     Final next level of care: Home w Home Health Services Barriers to Discharge: No Barriers Identified   Patient Goals and CMS Choice Patient states their goals for this hospitalization and ongoing recovery are:: to go home CMS Medicare.gov Compare Post Acute Care list provided to:: Patient Choice offered to / list presented to : Patient  Discharge Placement                    Patient and family notified of of transfer:  12/17/20  Discharge Plan and Services                                     Social Determinants of Health (SDOH) Interventions     Readmission Risk Interventions No flowsheet data found.

## 2020-12-19 ENCOUNTER — Inpatient Hospital Stay: Payer: Medicare Other | Admitting: Nurse Practitioner

## 2020-12-20 ENCOUNTER — Telehealth: Payer: Self-pay

## 2020-12-20 DIAGNOSIS — M47812 Spondylosis without myelopathy or radiculopathy, cervical region: Secondary | ICD-10-CM | POA: Diagnosis not present

## 2020-12-20 DIAGNOSIS — J9611 Chronic respiratory failure with hypoxia: Secondary | ICD-10-CM | POA: Diagnosis not present

## 2020-12-20 DIAGNOSIS — M47816 Spondylosis without myelopathy or radiculopathy, lumbar region: Secondary | ICD-10-CM | POA: Diagnosis not present

## 2020-12-20 DIAGNOSIS — I5043 Acute on chronic combined systolic (congestive) and diastolic (congestive) heart failure: Secondary | ICD-10-CM | POA: Diagnosis not present

## 2020-12-20 DIAGNOSIS — I6523 Occlusion and stenosis of bilateral carotid arteries: Secondary | ICD-10-CM | POA: Diagnosis not present

## 2020-12-20 DIAGNOSIS — J449 Chronic obstructive pulmonary disease, unspecified: Secondary | ICD-10-CM | POA: Diagnosis not present

## 2020-12-20 DIAGNOSIS — G43909 Migraine, unspecified, not intractable, without status migrainosus: Secondary | ICD-10-CM | POA: Diagnosis not present

## 2020-12-20 DIAGNOSIS — E871 Hypo-osmolality and hyponatremia: Secondary | ICD-10-CM | POA: Diagnosis not present

## 2020-12-20 DIAGNOSIS — E782 Mixed hyperlipidemia: Secondary | ICD-10-CM | POA: Diagnosis not present

## 2020-12-20 DIAGNOSIS — E039 Hypothyroidism, unspecified: Secondary | ICD-10-CM | POA: Diagnosis not present

## 2020-12-20 DIAGNOSIS — I951 Orthostatic hypotension: Secondary | ICD-10-CM | POA: Diagnosis not present

## 2020-12-20 DIAGNOSIS — I471 Supraventricular tachycardia: Secondary | ICD-10-CM | POA: Diagnosis not present

## 2020-12-20 DIAGNOSIS — M5136 Other intervertebral disc degeneration, lumbar region: Secondary | ICD-10-CM | POA: Diagnosis not present

## 2020-12-20 DIAGNOSIS — E119 Type 2 diabetes mellitus without complications: Secondary | ICD-10-CM | POA: Diagnosis not present

## 2020-12-20 DIAGNOSIS — M5481 Occipital neuralgia: Secondary | ICD-10-CM | POA: Diagnosis not present

## 2020-12-20 DIAGNOSIS — S82001D Unspecified fracture of right patella, subsequent encounter for closed fracture with routine healing: Secondary | ICD-10-CM | POA: Diagnosis not present

## 2020-12-20 DIAGNOSIS — I272 Pulmonary hypertension, unspecified: Secondary | ICD-10-CM | POA: Diagnosis not present

## 2020-12-20 DIAGNOSIS — M810 Age-related osteoporosis without current pathological fracture: Secondary | ICD-10-CM | POA: Diagnosis not present

## 2020-12-20 DIAGNOSIS — G9341 Metabolic encephalopathy: Secondary | ICD-10-CM | POA: Diagnosis not present

## 2020-12-20 DIAGNOSIS — S52022D Displaced fracture of olecranon process without intraarticular extension of left ulna, subsequent encounter for closed fracture with routine healing: Secondary | ICD-10-CM | POA: Diagnosis not present

## 2020-12-20 DIAGNOSIS — M4800 Spinal stenosis, site unspecified: Secondary | ICD-10-CM | POA: Diagnosis not present

## 2020-12-20 NOTE — Telephone Encounter (Signed)
Tanvi called with [facility], [phone], physical therapy resumption of care verbal orders, Paulene Floor,  1week4, hospital orders

## 2020-12-21 NOTE — Progress Notes (Deleted)
Cardiology Office Note    Date:  12/21/2020   ID:  [name], [name] 1946-04-29, MRN [id number]  PCP:  Lyndon Code, MD  Cardiologist:  Lorine Bears, MD  Electrophysiologist:  None   Chief Complaint: Hospital follow-up  History of Present Illness:   [name] is a 75 y.o. female with history of mild nonobstructive CAD, pulmonary hypertension, chronic hypoxic respiratory failure on home O2, severe COPD with prior tobacco use, DM2, HLD, chronic hyponatremia and exertional dyspnea who presents for hospital follow-up as outlined below.  She was seen by Dr. Kirke Corin in 03/2020 at the request of her PCP for dyspnea.  Echo at her PCPs office prior to that visit showed normal LV systolic function, diastolic dysfunction, and moderate pulmonary hypertension with a PASP of 45 mmHg.  It was noted she had previously undergone a CT of the lung which demonstrated extensive COPD and coronary artery calcifications.  Given exertional dyspnea symptoms and with noted coronary artery calcifications, she underwent R/LHC in 03/2020 which showed mildly calcified coronary arteries with mild nonobstructive disease, hyperdynamic LV systolic function with an EF greater than 70%, with right heart catheterization showing mildly elevated filling pressures, mild pulmonary hypertension, and normal cardiac output.  Medical therapy was recommended for nonobstructive disease.  Her pulmonary hypertension did not seem to be severe enough to require vasodilator therapy.  It was felt the majority of her exertional dyspnea was related to underlying pulmonary disease.  She was admitted to the hospital from 7/30 through 8/2 with acute on chronic hypoxic respiratory failure, likely related to pulmonary hypertension and acute COPD exacerbation/bronchitis.  High-sensitivity troponin negative x2, BNP 304, D-dimer 0.84.  CTA chest was negative for pulmonary embolism.  Incidentally, there were bilateral trace pleural effusions with the right  being greater than the left.  Echo showed an EF 60 to 65%, no regional wall motion abnormalities, normal LV diastolic function parameters, normal RV systolic function and ventricular cavity size, mild mitral regurgitation, and an estimated right atrial pressure of 3 mmHg.  She was treated by the hospitalist service with IV Lasix and duo nebs.  ***   Labs independently reviewed: 11/2020 - Hgb 12.1, PLT 228, potassium 3.9, BUN 11, serum creatinine 0.56, TSH normal, albumin 3.6, AST/ALT normal 08/2020 - A1c 5.5 03/2019 - magnesium 2.1 10/2018 - TC 151, TG 58, HDL 65, LDL 74  Past Medical History:  Diagnosis Date   Anxiety    Chest pain    CHF (congestive heart failure) (HCC)    COPD (chronic obstructive pulmonary disease) (HCC)    Depression    Diabetes mellitus without complication (HCC)    Fibromyalgia    Graves disease    Hypertension    IBS (irritable bowel syndrome)    PTSD (post-traumatic stress disorder)    Sinus problem    Spinal stenosis     Past Surgical History:  Procedure Laterality Date   ABDOMINAL HYSTERECTOMY     CAST APPLICATION  11/19/2020   Procedure: Application of long-leg hinged brace right lower extremity.;  Surgeon: Christena Flake, MD;  Location: [facility] ORS;  Service: Orthopedics;;   ESOPHAGOGASTRODUODENOSCOPY (EGD) WITH PROPOFOL N/A 01/10/2015   Procedure: ESOPHAGOGASTRODUODENOSCOPY (EGD) WITH PROPOFOL;  Surgeon: Wallace Cullens, MD;  Location: [facility] ENDOSCOPY;  Service: Gastroenterology;  Laterality: N/A;   ORIF ELBOW FRACTURE Left 11/19/2020   Procedure: OPEN REDUCTION INTERNAL FIXATION (ORIF) ELBOW/OLECRANON FRACTURE;  Surgeon: Christena Flake, MD;  Location: [facility] ORS;  Service: Orthopedics;  Laterality: Left;  RECTAL SURGERY     RIGHT/LEFT HEART CATH AND CORONARY ANGIOGRAPHY Bilateral 04/08/2020   Procedure: RIGHT/LEFT HEART CATH AND CORONARY ANGIOGRAPHY;  Surgeon: Iran Ouch, MD;  Location: [facility] INVASIVE CV LAB;  Service: Cardiovascular;  Laterality: Bilateral;    SAVORY DILATION N/A 01/10/2015   Procedure: SAVORY DILATION;  Surgeon: Wallace Cullens, MD;  Location: [facility] ENDOSCOPY;  Service: Gastroenterology;  Laterality: N/A;   toenail removal Bilateral    great toes   TONSILLECTOMY AND ADENOIDECTOMY      Current Medications: No outpatient medications have been marked as taking for the 12/27/20 encounter (Appointment) with Sondra Barges, PA-C.    Allergies:   Celecoxib and Pregabalin   Social History   Socioeconomic History   Marital status: Widowed    Spouse name: Not on file   Number of children: 2   Years of education: Not on file   Highest education level: Not on file  Occupational History   Not on file  Tobacco Use   Smoking status: Former    Packs/day: 0.50    Years: 25.00    Pack years: 12.50    Types: Cigarettes    Quit date: 06/03/2016    Years since quitting: 4.5   Smokeless tobacco: Never  Vaping Use   Vaping Use: Never used  Substance and Sexual Activity   Alcohol use: No    Alcohol/week: 0.0 standard drinks   Drug use: No   Sexual activity: Not Currently  Other Topics Concern   Not on file  Social History Narrative   Lives by herself; 1 daughter Marcelino Duster in [place]: 1 daughter in [place]    Social Determinants of Health   Financial Resource Strain: Not on file  Food Insecurity: Not on file  Transportation Needs: Not on file  Physical Activity: Not on file  Stress: Not on file  Social Connections: Not on file     Family History:  The patient's family history includes Alcohol abuse in her father, mother, and sister; Asthma in her sister; COPD in her sister; Depression in her father, maternal grandmother, mother, and sister; Diabetes in her paternal grandmother and sister; Heart disease in her sister; Hypertension in her sister; Stroke in her paternal grandmother; Varicose Veins in her mother.  ROS:   ROS   EKGs/Labs/Other Studies Reviewed:    Studies reviewed were summarized above. The additional studies were  reviewed today:  2D echo 11/2020: 1. Left ventricular ejection fraction, by estimation, is 60 to 65%. The  left ventricle has normal function. The left ventricle has no regional  wall motion abnormalities. Left ventricular diastolic parameters were  normal.   2. Right ventricular systolic function is normal. The right ventricular  size is normal.   3. The mitral valve is normal in structure. Mild mitral valve  regurgitation.  __________  [facility] 03/2020: There is hyperdynamic left ventricular systolic function. LV end diastolic pressure is mildly elevated. The left ventricular ejection fraction is greater than 65% by visual estimate. Prox Cx to Mid Cx lesion is 20% stenosed.   1.  Mildly calcified coronary arteries with mild nonobstructive disease. 2.  Hyperdynamic LV systolic function with an EF of greater than 70%. 3.  Right heart catheterization showed mildly elevated filling pressures, mild pulmonary hypertension and normal cardiac output.   RA: 10 mmHg RV: 50/5 mmHg PCW: 16 mmHg PA: 43/17 with a mean of 30 mmHg Cardiac output: 4.40 with a cardiac index of 2.76.   Recommendations: Recommend medical therapy for nonobstructive  coronary artery disease. Pulmonary hypertension does not seem to be severe enough to require vasodilator therapy. A small dose diuretic can be considered as a trial. Suspect that the majority of her exertional dyspnea is related to underlying lung disease. __________  2D echo 12/2016: - Left ventricle: Wall thickness was increased in a pattern of mild    LVH. Systolic function was normal. The estimated ejection    fraction was in the range of 60% to 65%. Doppler parameters are    consistent with abnormal left ventricular relaxation (grade 1    diastolic dysfunction).  - Aortic valve: Valve area (Vmax): 2.77 cm^2.    EKG:  EKG is ordered today.  The EKG ordered today demonstrates ***  Recent Labs: 12/14/2020: ALT 12; B Natriuretic Peptide 304.1; TSH  2.724 12/15/2020: BUN 11; Creatinine, Ser 0.56; Hemoglobin 12.1; Platelets 228; Potassium 3.9; Sodium 130  Recent Lipid Panel No results found for: CHOL, TRIG, HDL, CHOLHDL, VLDL, LDLCALC, LDLDIRECT  PHYSICAL EXAM:    VS:  There were no vitals taken for this visit.  BMI: There is no height or weight on file to calculate BMI.  Physical Exam  Wt Readings from Last 3 Encounters:  12/15/20 141 lb 15.6 oz (64.4 kg)  11/18/20 118 lb 6.2 oz (53.7 kg)  10/29/20 118 lb 6.4 oz (53.7 kg)     ASSESSMENT & PLAN:   Mild nonobstructive CAD:  Mild pulmonary hypertension:  Severe COPD with ongoing dyspnea:  HLD: LDL 74.  Disposition: F/u with Dr. Kirke Corin or an APP in ***.   Medication Adjustments/Labs and Tests Ordered: Current medicines are reviewed at length with the patient today.  Concerns regarding medicines are outlined above. Medication changes, Labs and Tests ordered today are summarized above and listed in the Patient Instructions accessible in Encounters.   Signed, Eula Listen, PA-C 12/21/2020 1:36 PM     [facility] [address] [place], [place] [address] [phone]

## 2020-12-23 ENCOUNTER — Other Ambulatory Visit: Payer: Self-pay | Admitting: *Deleted

## 2020-12-23 DIAGNOSIS — S52032D Displaced fracture of olecranon process with intraarticular extension of left ulna, subsequent encounter for closed fracture with routine healing: Secondary | ICD-10-CM | POA: Diagnosis not present

## 2020-12-23 DIAGNOSIS — S82034D Nondisplaced transverse fracture of right patella, subsequent encounter for closed fracture with routine healing: Secondary | ICD-10-CM | POA: Diagnosis not present

## 2020-12-23 NOTE — Patient Outreach (Signed)
[facility]) Care Management  12/23/2020  [name] [birth year] [id number]  [facility] Unsuccessful outreach for post hospital referred patient   Outreach attempt to the home number [phone] Phone answered and hung up   Plan: [facility] RN CM scheduled this patient for another call attempt within 4-7 business days Unsuccessful outreach letter sent on 12/23/20 Unsuccessful outreach on 12/23/20   [name], RN, BSN, CCM [facility] Telephonic Care Management Care Coordinator Office number [phone] Mobile number [phone]  [facility] number [phone] Fax number [phone]

## 2020-12-24 ENCOUNTER — Encounter: Payer: Self-pay | Admitting: Nurse Practitioner

## 2020-12-24 ENCOUNTER — Other Ambulatory Visit: Payer: Self-pay

## 2020-12-24 ENCOUNTER — Telehealth (INDEPENDENT_AMBULATORY_CARE_PROVIDER_SITE_OTHER): Payer: Medicare Other | Admitting: Nurse Practitioner

## 2020-12-24 DIAGNOSIS — I272 Pulmonary hypertension, unspecified: Secondary | ICD-10-CM | POA: Diagnosis not present

## 2020-12-24 DIAGNOSIS — I951 Orthostatic hypotension: Secondary | ICD-10-CM | POA: Diagnosis not present

## 2020-12-24 DIAGNOSIS — S82001D Unspecified fracture of right patella, subsequent encounter for closed fracture with routine healing: Secondary | ICD-10-CM | POA: Diagnosis not present

## 2020-12-24 DIAGNOSIS — M4800 Spinal stenosis, site unspecified: Secondary | ICD-10-CM | POA: Diagnosis not present

## 2020-12-24 DIAGNOSIS — M47812 Spondylosis without myelopathy or radiculopathy, cervical region: Secondary | ICD-10-CM | POA: Diagnosis not present

## 2020-12-24 DIAGNOSIS — S82025D Nondisplaced longitudinal fracture of left patella, subsequent encounter for closed fracture with routine healing: Secondary | ICD-10-CM

## 2020-12-24 DIAGNOSIS — M47816 Spondylosis without myelopathy or radiculopathy, lumbar region: Secondary | ICD-10-CM | POA: Diagnosis not present

## 2020-12-24 DIAGNOSIS — M5481 Occipital neuralgia: Secondary | ICD-10-CM | POA: Diagnosis not present

## 2020-12-24 DIAGNOSIS — F5101 Primary insomnia: Secondary | ICD-10-CM

## 2020-12-24 DIAGNOSIS — J449 Chronic obstructive pulmonary disease, unspecified: Secondary | ICD-10-CM | POA: Diagnosis not present

## 2020-12-24 DIAGNOSIS — F3340 Major depressive disorder, recurrent, in remission, unspecified: Secondary | ICD-10-CM

## 2020-12-24 DIAGNOSIS — E119 Type 2 diabetes mellitus without complications: Secondary | ICD-10-CM | POA: Diagnosis not present

## 2020-12-24 DIAGNOSIS — G43909 Migraine, unspecified, not intractable, without status migrainosus: Secondary | ICD-10-CM | POA: Diagnosis not present

## 2020-12-24 DIAGNOSIS — J9611 Chronic respiratory failure with hypoxia: Secondary | ICD-10-CM | POA: Diagnosis not present

## 2020-12-24 DIAGNOSIS — E871 Hypo-osmolality and hyponatremia: Secondary | ICD-10-CM | POA: Diagnosis not present

## 2020-12-24 DIAGNOSIS — M5136 Other intervertebral disc degeneration, lumbar region: Secondary | ICD-10-CM | POA: Diagnosis not present

## 2020-12-24 DIAGNOSIS — I5043 Acute on chronic combined systolic (congestive) and diastolic (congestive) heart failure: Secondary | ICD-10-CM | POA: Diagnosis not present

## 2020-12-24 DIAGNOSIS — G9341 Metabolic encephalopathy: Secondary | ICD-10-CM | POA: Diagnosis not present

## 2020-12-24 DIAGNOSIS — E782 Mixed hyperlipidemia: Secondary | ICD-10-CM | POA: Diagnosis not present

## 2020-12-24 DIAGNOSIS — I471 Supraventricular tachycardia: Secondary | ICD-10-CM | POA: Diagnosis not present

## 2020-12-24 DIAGNOSIS — M810 Age-related osteoporosis without current pathological fracture: Secondary | ICD-10-CM | POA: Diagnosis not present

## 2020-12-24 DIAGNOSIS — E039 Hypothyroidism, unspecified: Secondary | ICD-10-CM | POA: Diagnosis not present

## 2020-12-24 DIAGNOSIS — I6523 Occlusion and stenosis of bilateral carotid arteries: Secondary | ICD-10-CM | POA: Diagnosis not present

## 2020-12-24 DIAGNOSIS — S52022D Displaced fracture of olecranon process without intraarticular extension of left ulna, subsequent encounter for closed fracture with routine healing: Secondary | ICD-10-CM | POA: Diagnosis not present

## 2020-12-24 MED ORDER — LORAZEPAM 1 MG PO TABS: 1.0000 mg | ORAL_TABLET | Freq: Every day | ORAL | 2 refills | Status: DC

## 2020-12-24 MED ORDER — FLUOXETINE HCL 20 MG PO CAPS: 40.0000 mg | ORAL_CAPSULE | Freq: Every day | ORAL | 1 refills | Status: DC

## 2020-12-24 NOTE — Progress Notes (Signed)
[facility] [address] [place], [place] [address]  Internal MEDICINE  Telephone Visit  Patient Name: Danielle KaysBetty C Poole  604540August 28, 2047  [id number]  Date of Service: 12/24/2020  I connected with the patient at 4:55 PM by telephone and verified the patients identity using two identifiers.   I discussed the limitations, risks, security and privacy concerns of performing an evaluation and management service by telephone and the availability of in person appointments. I also discussed with the patient that there may be a patient responsible charge related to the service.  The patient expressed understanding and agrees to proceed.    Chief Complaint  Patient presents with   Hospitalization Follow-up    Broken left leg and had surgery, pat is currently having pt done at  home.    Telephone Screen    [phone] telephone call    Telephone Assessment    HPI Kathie RhodesBetty presents for a telehealth virtual visit for for a hospital follow up. She is unable to come to the clinic for an in-person visit due to her patellar fracture for which she must keep her knee locked in fully extended position which makes it extremely difficult to get into a vehicle for transport. She was recently hospitalized for shortness of breath secondary to heart failure exacerbation. When discussing this with patient, she presented with confusion over the phone insisting that her recent hospitalization was for her broken leg. When asked about her breathing she states it is fine. She does not sound like she is in acute distress over the phone.   Current Medication: Outpatient Encounter Medications as of 12/24/2020  Medication Sig Note   acetaminophen (TYLENOL) 500 MG tablet Take 1,000 mg by mouth in the morning, at noon, and at bedtime.    albuterol (PROAIR HFA) 108 (90 Base) MCG/ACT inhaler INHALE 2 PUFFS INTO LUNGS EVERY 6 HOURS AS NEEDED FOR WHEEZING OR SHORTNESS OF BREATH    albuterol (VENTOLIN HFA) 108 (90 Base) MCG/ACT  inhaler Inhale 2 puffs into the lungs every 6 (six) hours as needed for wheezing or shortness of breath.    alendronate (FOSAMAX) 70 MG tablet Take 70 mg by mouth every Tuesday.     aspirin EC 81 MG tablet Take 81 mg by mouth daily. Swallow whole.    Azelastine HCl 137 MCG/SPRAY SOLN Place 2 sprays into both nostrils in the morning and at bedtime.     Cyanocobalamin (VITAMIN B-12) 1000 MCG/15ML LIQD Take 1,200 mcg by mouth daily.    Ergocalciferol 50 MCG (2000 UT) TABS Take 2 capsules by mouth daily.    fluticasone (FLONASE) 50 MCG/ACT nasal spray Place 2 sprays into both nostrils in the morning and at bedtime.     furosemide (LASIX) 20 MG tablet Take 1 tablet (20 mg total) by mouth every other day.    guaifenesin (HUMIBID E) 400 MG TABS tablet Take 800 mg by mouth every 6 (six) hours as needed (congestion/cough).     HYDROcodone-acetaminophen (NORCO/VICODIN) 5-325 MG tablet Take 1-2 tablets by mouth every 4 (four) hours as needed for moderate pain.    hydrocortisone 2.5 % cream Place 1 application rectally 2 (two) times daily as needed (itching/hemorroids).     ipratropium-albuterol (DUONEB) 0.5-2.5 (3) MG/3ML SOLN One vial 3 x  aday for copd with hypoxia 05/22/2020: Patient states she takes twice daily   levothyroxine (SYNTHROID) 88 MCG tablet Take 88 mcg by mouth daily before breakfast. Take 30 minutes before food and with a full glass of water  lubiprostone (AMITIZA) 8 MCG capsule Take 8 mcg by mouth daily with breakfast.    Multiple Vitamin (MULTIVITAMIN WITH MINERALS) TABS tablet Take 1 tablet by mouth daily. Multivitamin for Seniors    ondansetron (ZOFRAN) 4 MG tablet Take 1 tablet (4 mg total) by mouth every 6 (six) hours as needed for nausea.    rosuvastatin (CRESTOR) 20 MG tablet Take 1 tablet (20 mg total) by mouth daily.    SYMBICORT 160-4.5 MCG/ACT inhaler Inhale 2 puffs into the lungs 2 (two) times daily.    tiotropium (SPIRIVA) 18 MCG inhalation capsule Place 18 mcg into inhaler and  inhale in the morning and at bedtime.    [DISCONTINUED] FLUoxetine (PROZAC) 20 MG capsule Take 2 capsules (40 mg total) by mouth daily.    [DISCONTINUED] LORazepam (ATIVAN) 1 MG tablet Take 1 tablet (1 mg total) by mouth at bedtime.    [START ON 01/16/2021] FLUoxetine (PROZAC) 20 MG capsule Take 2 capsules (40 mg total) by mouth daily.    LORazepam (ATIVAN) 1 MG tablet Take 1 tablet (1 mg total) by mouth at bedtime.    [DISCONTINUED] enoxaparin (LOVENOX) 40 MG/0.4ML injection Inject 0.4 mLs (40 mg total) into the skin daily. (Patient not taking: Reported on 12/14/2020)    [DISCONTINUED] LORazepam (ATIVAN) 1 MG tablet Take 1 tablet (1 mg total) by mouth at bedtime.    No facility-administered encounter medications on file as of 12/24/2020.    Surgical History: Past Surgical History:  Procedure Laterality Date   ABDOMINAL HYSTERECTOMY     CAST APPLICATION  11/19/2020   Procedure: Application of long-leg hinged brace right lower extremity.;  Surgeon: Christena Flake, MD;  Location: [facility] ORS;  Service: Orthopedics;;   ESOPHAGOGASTRODUODENOSCOPY (EGD) WITH PROPOFOL N/A 01/10/2015   Procedure: ESOPHAGOGASTRODUODENOSCOPY (EGD) WITH PROPOFOL;  Surgeon: Wallace Cullens, MD;  Location: [facility] ENDOSCOPY;  Service: Gastroenterology;  Laterality: N/A;   ORIF ELBOW FRACTURE Left 11/19/2020   Procedure: OPEN REDUCTION INTERNAL FIXATION (ORIF) ELBOW/OLECRANON FRACTURE;  Surgeon: Christena Flake, MD;  Location: [facility] ORS;  Service: Orthopedics;  Laterality: Left;   RECTAL SURGERY     RIGHT/LEFT HEART CATH AND CORONARY ANGIOGRAPHY Bilateral 04/08/2020   Procedure: RIGHT/LEFT HEART CATH AND CORONARY ANGIOGRAPHY;  Surgeon: Iran Ouch, MD;  Location: [facility] INVASIVE CV LAB;  Service: Cardiovascular;  Laterality: Bilateral;   SAVORY DILATION N/A 01/10/2015   Procedure: SAVORY DILATION;  Surgeon: Wallace Cullens, MD;  Location: [facility] ENDOSCOPY;  Service: Gastroenterology;  Laterality: N/A;   toenail removal Bilateral    great toes    TONSILLECTOMY AND ADENOIDECTOMY      Medical History: Past Medical History:  Diagnosis Date   Anxiety    Chest pain    CHF (congestive heart failure) (HCC)    COPD (chronic obstructive pulmonary disease) (HCC)    Depression    Diabetes mellitus without complication (HCC)    Fibromyalgia    Graves disease    Hypertension    IBS (irritable bowel syndrome)    PTSD (post-traumatic stress disorder)    Sinus problem    Spinal stenosis     Family History: Family History  Problem Relation Age of Onset   Alcohol abuse Mother    Depression Mother    Varicose Veins Mother    Alcohol abuse Father    Depression Father    Alcohol abuse Sister    Asthma Sister    COPD Sister    Depression Sister    Heart disease Sister  Hypertension Sister    Diabetes Sister    Depression Maternal Grandmother    Diabetes Paternal Grandmother    Stroke Paternal Grandmother     Social History   Socioeconomic History   Marital status: Widowed    Spouse name: Not on file   Number of children: 2   Years of education: college   Highest education level: Not on file  Occupational History   Not on file  Tobacco Use   Smoking status: Former    Packs/day: 0.50    Years: 25.00    Pack years: 12.50    Types: Cigarettes    Quit date: 06/03/2016    Years since quitting: 4.5   Smokeless tobacco: Never  Vaping Use   Vaping Use: Never used  Substance and Sexual Activity   Alcohol use: No    Alcohol/week: 0.0 standard drinks   Drug use: No   Sexual activity: Not Currently  Other Topics Concern   Not on file  Social History Narrative   Lives by herself; 1 daughter Marcelino Duster in [place]: 1 daughter in [place]    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   Social Determinants of Health   Financial Resource Strain: Not on file  Food Insecurity: No Food Insecurity   Worried About Programme researcher, broadcasting/film/video in the Last Year: Never true   Barista in the Last Year: Never true   Transportation Needs: No Transportation Needs   Lack of Transportation (Medical): No   Lack of Transportation (Non-Medical): No  Physical Activity: Not on file  Stress: No Stress Concern Present   Feeling of Stress : Only a little  Social Connections: Not on file  Intimate Partner Violence: Not At Risk   Fear of Current or Ex-Partner: No   Emotionally Abused: No   Physically Abused: No   Sexually Abused: No      Review of Systems  Constitutional:  Positive for fatigue. Negative for chills and unexpected weight change.  HENT:  Negative for congestion, rhinorrhea, sneezing and sore throat.   Eyes:  Negative for redness.  Respiratory:  Positive for shortness of breath (intermittent). Negative for cough and chest tightness.   Cardiovascular:  Negative for chest pain and palpitations.  Gastrointestinal:  Negative for abdominal pain, constipation, diarrhea, nausea and vomiting.  Genitourinary:  Negative for dysuria and frequency.  Musculoskeletal:  Positive for arthralgias, gait problem, joint swelling and myalgias. Negative for back pain and neck pain.  Skin:  Negative for rash.  Neurological:  Negative for tremors and numbness.  Hematological:  Negative for adenopathy. Does not bruise/bleed easily.  Psychiatric/Behavioral:  Negative for behavioral problems (Depression), sleep disturbance and suicidal ideas. The patient is not nervous/anxious.    Vital Signs: Wt 141 lb (64 kg)   BMI 22.76 kg/m    Observation/Objective: She is alert, oriented but seems to be confused about the reason for her recent hospitalization. She does engage in conversation appropriate and does not sound as though she is in any acute distress.     Assessment/Plan:   General Counseling: Dally verbalizes understanding of the findings of today's phone visit and agrees with plan of treatment. I have discussed any further diagnostic evaluation that may be needed or ordered today. We also reviewed her  medications today. she has been encouraged to call the office with any questions or concerns that should arise related to todays visit.  Return in about 3 months (around 03/26/2021) for F/U, in person, anxiety med  refill, Cassandr Cederberg PCP.   No orders of the defined types were placed in this encounter.   Meds ordered this encounter  Medications   FLUoxetine (PROZAC) 20 MG capsule    Sig: Take 2 capsules (40 mg total) by mouth daily.    Dispense:  90 capsule    Refill:  1   DISCONTD: LORazepam (ATIVAN) 1 MG tablet    Sig: Take 1 tablet (1 mg total) by mouth at bedtime.    Dispense:  30 tablet    Refill:  2   LORazepam (ATIVAN) 1 MG tablet    Sig: Take 1 tablet (1 mg total) by mouth at bedtime.    Dispense:  30 tablet    Refill:  2    Time spent:20 Minutes Time spent with patient included reviewing progress notes, labs, imaging studies, and discussing plan for follow up.  [place] Controlled Substance Database was reviewed by me for overdose risk score (ORS) if appropriate. ORS is 620.  This patient was seen by Sallyanne Kuster, FNP-C in collaboration with Dr. Beverely Risen as a part of collaborative care agreement.  Josiah Nieto R. Tedd Sias, MSN, FNP-C Internal medicine

## 2020-12-25 DIAGNOSIS — M810 Age-related osteoporosis without current pathological fracture: Secondary | ICD-10-CM | POA: Diagnosis not present

## 2020-12-25 DIAGNOSIS — E119 Type 2 diabetes mellitus without complications: Secondary | ICD-10-CM | POA: Diagnosis not present

## 2020-12-25 DIAGNOSIS — I951 Orthostatic hypotension: Secondary | ICD-10-CM | POA: Diagnosis not present

## 2020-12-25 DIAGNOSIS — M47816 Spondylosis without myelopathy or radiculopathy, lumbar region: Secondary | ICD-10-CM | POA: Diagnosis not present

## 2020-12-25 DIAGNOSIS — M4800 Spinal stenosis, site unspecified: Secondary | ICD-10-CM | POA: Diagnosis not present

## 2020-12-25 DIAGNOSIS — G43909 Migraine, unspecified, not intractable, without status migrainosus: Secondary | ICD-10-CM | POA: Diagnosis not present

## 2020-12-25 DIAGNOSIS — J9611 Chronic respiratory failure with hypoxia: Secondary | ICD-10-CM | POA: Diagnosis not present

## 2020-12-25 DIAGNOSIS — M5481 Occipital neuralgia: Secondary | ICD-10-CM | POA: Diagnosis not present

## 2020-12-25 DIAGNOSIS — E871 Hypo-osmolality and hyponatremia: Secondary | ICD-10-CM | POA: Diagnosis not present

## 2020-12-25 DIAGNOSIS — E782 Mixed hyperlipidemia: Secondary | ICD-10-CM | POA: Diagnosis not present

## 2020-12-25 DIAGNOSIS — S52022D Displaced fracture of olecranon process without intraarticular extension of left ulna, subsequent encounter for closed fracture with routine healing: Secondary | ICD-10-CM | POA: Diagnosis not present

## 2020-12-25 DIAGNOSIS — S82001D Unspecified fracture of right patella, subsequent encounter for closed fracture with routine healing: Secondary | ICD-10-CM | POA: Diagnosis not present

## 2020-12-25 DIAGNOSIS — I5043 Acute on chronic combined systolic (congestive) and diastolic (congestive) heart failure: Secondary | ICD-10-CM | POA: Diagnosis not present

## 2020-12-25 DIAGNOSIS — I272 Pulmonary hypertension, unspecified: Secondary | ICD-10-CM | POA: Diagnosis not present

## 2020-12-25 DIAGNOSIS — M5136 Other intervertebral disc degeneration, lumbar region: Secondary | ICD-10-CM | POA: Diagnosis not present

## 2020-12-25 DIAGNOSIS — M47812 Spondylosis without myelopathy or radiculopathy, cervical region: Secondary | ICD-10-CM | POA: Diagnosis not present

## 2020-12-25 DIAGNOSIS — I6523 Occlusion and stenosis of bilateral carotid arteries: Secondary | ICD-10-CM | POA: Diagnosis not present

## 2020-12-25 DIAGNOSIS — G9341 Metabolic encephalopathy: Secondary | ICD-10-CM | POA: Diagnosis not present

## 2020-12-25 DIAGNOSIS — I471 Supraventricular tachycardia: Secondary | ICD-10-CM | POA: Diagnosis not present

## 2020-12-25 DIAGNOSIS — J449 Chronic obstructive pulmonary disease, unspecified: Secondary | ICD-10-CM | POA: Diagnosis not present

## 2020-12-25 DIAGNOSIS — E039 Hypothyroidism, unspecified: Secondary | ICD-10-CM | POA: Diagnosis not present

## 2020-12-26 ENCOUNTER — Telehealth: Payer: Self-pay

## 2020-12-26 ENCOUNTER — Other Ambulatory Visit: Payer: Self-pay | Admitting: *Deleted

## 2020-12-26 DIAGNOSIS — E782 Mixed hyperlipidemia: Secondary | ICD-10-CM | POA: Diagnosis not present

## 2020-12-26 DIAGNOSIS — M47816 Spondylosis without myelopathy or radiculopathy, lumbar region: Secondary | ICD-10-CM | POA: Diagnosis not present

## 2020-12-26 DIAGNOSIS — M4800 Spinal stenosis, site unspecified: Secondary | ICD-10-CM | POA: Diagnosis not present

## 2020-12-26 DIAGNOSIS — I5043 Acute on chronic combined systolic (congestive) and diastolic (congestive) heart failure: Secondary | ICD-10-CM | POA: Diagnosis not present

## 2020-12-26 DIAGNOSIS — E119 Type 2 diabetes mellitus without complications: Secondary | ICD-10-CM | POA: Diagnosis not present

## 2020-12-26 DIAGNOSIS — J449 Chronic obstructive pulmonary disease, unspecified: Secondary | ICD-10-CM | POA: Diagnosis not present

## 2020-12-26 DIAGNOSIS — I951 Orthostatic hypotension: Secondary | ICD-10-CM | POA: Diagnosis not present

## 2020-12-26 DIAGNOSIS — M5481 Occipital neuralgia: Secondary | ICD-10-CM | POA: Diagnosis not present

## 2020-12-26 DIAGNOSIS — I471 Supraventricular tachycardia: Secondary | ICD-10-CM | POA: Diagnosis not present

## 2020-12-26 DIAGNOSIS — G9341 Metabolic encephalopathy: Secondary | ICD-10-CM | POA: Diagnosis not present

## 2020-12-26 DIAGNOSIS — I272 Pulmonary hypertension, unspecified: Secondary | ICD-10-CM | POA: Diagnosis not present

## 2020-12-26 DIAGNOSIS — E871 Hypo-osmolality and hyponatremia: Secondary | ICD-10-CM | POA: Diagnosis not present

## 2020-12-26 DIAGNOSIS — S82001D Unspecified fracture of right patella, subsequent encounter for closed fracture with routine healing: Secondary | ICD-10-CM | POA: Diagnosis not present

## 2020-12-26 DIAGNOSIS — I6523 Occlusion and stenosis of bilateral carotid arteries: Secondary | ICD-10-CM | POA: Diagnosis not present

## 2020-12-26 DIAGNOSIS — G43909 Migraine, unspecified, not intractable, without status migrainosus: Secondary | ICD-10-CM | POA: Diagnosis not present

## 2020-12-26 DIAGNOSIS — M810 Age-related osteoporosis without current pathological fracture: Secondary | ICD-10-CM | POA: Diagnosis not present

## 2020-12-26 DIAGNOSIS — J9611 Chronic respiratory failure with hypoxia: Secondary | ICD-10-CM | POA: Diagnosis not present

## 2020-12-26 DIAGNOSIS — M47812 Spondylosis without myelopathy or radiculopathy, cervical region: Secondary | ICD-10-CM | POA: Diagnosis not present

## 2020-12-26 DIAGNOSIS — S52022D Displaced fracture of olecranon process without intraarticular extension of left ulna, subsequent encounter for closed fracture with routine healing: Secondary | ICD-10-CM | POA: Diagnosis not present

## 2020-12-26 DIAGNOSIS — M5136 Other intervertebral disc degeneration, lumbar region: Secondary | ICD-10-CM | POA: Diagnosis not present

## 2020-12-26 DIAGNOSIS — E039 Hypothyroidism, unspecified: Secondary | ICD-10-CM | POA: Diagnosis not present

## 2020-12-26 NOTE — Telephone Encounter (Signed)
[facility]

## 2020-12-26 NOTE — Telephone Encounter (Signed)
[facility] home [facility] faxed bp reading for FYI 156/76 and pulse 78  and pt was doing ok  and no other symptoms  and relayed message to alyssa pt was seen yesterday for hospital follow up

## 2020-12-26 NOTE — Patient Outreach (Signed)
[facility]) Care Management  12/26/2020  [name] [birth year] [id number]   [facility] Unsuccessful outreach for post hospital referred patient     Outreach attempt to the home number [phone] No answer   Plan: [facility] RN CM scheduled this patient for another call attempt within 4-7 business days Unsuccessful outreach letter sent on 12/23/20 Unsuccessful outreach on 12/23/20, 12/26/20     Danielle Bradford L. Noelle Penner, RN, BSN, CCM [facility] Telephonic Care Management Care Coordinator Office number [phone] Mobile number [phone]  Main [facility] number [phone] Fax number [phone]

## 2020-12-27 ENCOUNTER — Encounter: Payer: Self-pay | Admitting: *Deleted

## 2020-12-27 ENCOUNTER — Other Ambulatory Visit: Payer: Self-pay | Admitting: *Deleted

## 2020-12-27 ENCOUNTER — Other Ambulatory Visit: Payer: Self-pay

## 2020-12-27 ENCOUNTER — Telehealth: Payer: Self-pay

## 2020-12-27 ENCOUNTER — Ambulatory Visit: Payer: Medicare Other | Admitting: Physician Assistant

## 2020-12-27 DIAGNOSIS — R0602 Shortness of breath: Secondary | ICD-10-CM | POA: Diagnosis not present

## 2020-12-27 MED ORDER — LORAZEPAM 1 MG PO TABS: 1.0000 mg | ORAL_TABLET | Freq: Every day | ORAL | 2 refills | Status: DC

## 2020-12-27 NOTE — Telephone Encounter (Signed)
Pt advised we send med  

## 2020-12-27 NOTE — Telephone Encounter (Signed)
Received medical record request from [facility] requesting records. Completed record request and mailed requesting records to [facility] at [address] [place] [address].

## 2020-12-27 NOTE — Patient Outreach (Addendum)
[facility]) Care Management  12/27/2020  [name] 20-Jul-1945 [id number]  [facility] outreach for post hospital referred patient  [name] was referred to [facility] for post hospital outreach on 12/20/20. After a few unsuccessful attempts she was reached today 12/27/20 for initial telephone assessment  Insurance united healthcare medicare, [place] access   She is able to verify HIPAA identifiers   Initial telephone assessment She reports she is doing well  She states her after summary sheet was reviewed with her by her aide that visits RN CM briefed it with her again She confirms appointments, medications, home services are present and scheduled She continues to have home health therapies  Appetite not good but she reports she anticipates her  appetite to improve She reports she is maintaining her weight and has just started practicing taking weights daily, part of her congestive Heart Failure (CHF) action plan  She reports she primary concern is her treatment plan for her fractures She reports her pain is minimal if she is not exerted  She is taking extra tylenol only but has been ordered other pain medications by the orthopedic MD seen on 12/25/20 She is to follow up with ortho again this month  She remains on oxygen and tolerates it well   She reports she has assist with her insurance coverage transportation but reports it is very tasking to get out of her home to appointments r/t to her fractures of her left arm and right patella  RN CM reminded her also of her options to request tele -visits as needed She voiced understanding  Plans Patient agrees to care plan and follow up within the next 14-21 business days  Goals Addressed               This Visit's Progress     Patient Stated     [facility]) Patient will verbalize continuation of following COPD color zones and rescue action plans for the next 90 days. (pt-stated)   On track     Timeframe:   Long-Range Goal Priority:  High Start Date:  02/26/20                           Expected End Date: 03/16/21                    Follow Up Date 01/13/21   - develop a rescue plan - follow rescue plan if symptoms flare-up - keep follow-up appointments  -Encouraged patient to continue to follow COPD color zones and rescue action plans   Notes: Patient reports quickly contacting her provider with increased COPD symptoms. Nurse will send patient COPD education that includes zones and action plans.   Updated  12/27/20 admission 12/14/20-12/17/20 has made appointments and seen pulmonology, monitoring s/s, remains on 2 L oxygen 08/16/20: Patient reports receiving [facility] COPD education and states she does use it to track her symptoms and follows the rescue action plans.       [facility]) Patient will verbalize continuation of managing her fatigue for the next 90 days (pt-stated)   On track     Timeframe:  Long-Range Goal Priority:  High Start Date: 02/26/20                            Expected End Date:  03/16/21  Follow Up Date 01/13/21   - eat healthy - get at least 7 to 8 hours of sleep at night - get outdoors every day (weather permitting)   -Encouraged patient to continue to walk daily to maintain strength and mobility -Discussed resting as needed and getting adequate sleep  -Encouraged continuation of drinking supplements to ensure patient receives adequate nutrition    Notes: Patient reports her COPD is at baseline currently. At this time patient's fatigue is managed pretty well. Patient reports walking routinely to maintain her strength and overall health.   Updated  12/27/20 continues with fatigue, working with home health, takes tylenol extra strength only for pain appetite poor but anticipates it to improve 08/16/20: Patient reports getting adequate sleep at night and she does rest during the day as needed. Patient continues to walk routinely to help with expanding her lungs, to  maintain strength, and to maintain mobility. Nurse will send patient [employer] coupons.         Joshuajames Moehring L. Noelle Penner, RN, BSN, CCM [facility] Telephonic Care Management Care Coordinator Office number [phone] Main [facility] number [phone] Fax number [phone]

## 2020-12-30 ENCOUNTER — Encounter: Payer: Self-pay | Admitting: Physician Assistant

## 2020-12-31 ENCOUNTER — Ambulatory Visit: Payer: Medicare Other | Admitting: Family

## 2020-12-31 ENCOUNTER — Encounter: Payer: Self-pay | Admitting: Internal Medicine

## 2020-12-31 DIAGNOSIS — J9611 Chronic respiratory failure with hypoxia: Secondary | ICD-10-CM | POA: Diagnosis not present

## 2020-12-31 DIAGNOSIS — E039 Hypothyroidism, unspecified: Secondary | ICD-10-CM | POA: Diagnosis not present

## 2020-12-31 DIAGNOSIS — M5481 Occipital neuralgia: Secondary | ICD-10-CM | POA: Diagnosis not present

## 2020-12-31 DIAGNOSIS — I6523 Occlusion and stenosis of bilateral carotid arteries: Secondary | ICD-10-CM | POA: Diagnosis not present

## 2020-12-31 DIAGNOSIS — J449 Chronic obstructive pulmonary disease, unspecified: Secondary | ICD-10-CM | POA: Diagnosis not present

## 2020-12-31 DIAGNOSIS — M47812 Spondylosis without myelopathy or radiculopathy, cervical region: Secondary | ICD-10-CM | POA: Diagnosis not present

## 2020-12-31 DIAGNOSIS — S82001D Unspecified fracture of right patella, subsequent encounter for closed fracture with routine healing: Secondary | ICD-10-CM | POA: Diagnosis not present

## 2020-12-31 DIAGNOSIS — M5136 Other intervertebral disc degeneration, lumbar region: Secondary | ICD-10-CM | POA: Diagnosis not present

## 2020-12-31 DIAGNOSIS — I951 Orthostatic hypotension: Secondary | ICD-10-CM | POA: Diagnosis not present

## 2020-12-31 DIAGNOSIS — I471 Supraventricular tachycardia: Secondary | ICD-10-CM | POA: Diagnosis not present

## 2020-12-31 DIAGNOSIS — M810 Age-related osteoporosis without current pathological fracture: Secondary | ICD-10-CM | POA: Diagnosis not present

## 2020-12-31 DIAGNOSIS — E119 Type 2 diabetes mellitus without complications: Secondary | ICD-10-CM | POA: Diagnosis not present

## 2020-12-31 DIAGNOSIS — S52022D Displaced fracture of olecranon process without intraarticular extension of left ulna, subsequent encounter for closed fracture with routine healing: Secondary | ICD-10-CM | POA: Diagnosis not present

## 2020-12-31 DIAGNOSIS — I272 Pulmonary hypertension, unspecified: Secondary | ICD-10-CM | POA: Diagnosis not present

## 2020-12-31 DIAGNOSIS — I5043 Acute on chronic combined systolic (congestive) and diastolic (congestive) heart failure: Secondary | ICD-10-CM | POA: Diagnosis not present

## 2020-12-31 DIAGNOSIS — M47816 Spondylosis without myelopathy or radiculopathy, lumbar region: Secondary | ICD-10-CM | POA: Diagnosis not present

## 2020-12-31 DIAGNOSIS — G9341 Metabolic encephalopathy: Secondary | ICD-10-CM | POA: Diagnosis not present

## 2020-12-31 DIAGNOSIS — M4800 Spinal stenosis, site unspecified: Secondary | ICD-10-CM | POA: Diagnosis not present

## 2020-12-31 DIAGNOSIS — E871 Hypo-osmolality and hyponatremia: Secondary | ICD-10-CM | POA: Diagnosis not present

## 2020-12-31 DIAGNOSIS — E782 Mixed hyperlipidemia: Secondary | ICD-10-CM | POA: Diagnosis not present

## 2020-12-31 DIAGNOSIS — G43909 Migraine, unspecified, not intractable, without status migrainosus: Secondary | ICD-10-CM | POA: Diagnosis not present

## 2021-01-02 ENCOUNTER — Other Ambulatory Visit: Payer: Self-pay

## 2021-01-02 ENCOUNTER — Telehealth: Payer: Self-pay

## 2021-01-02 DIAGNOSIS — M5136 Other intervertebral disc degeneration, lumbar region: Secondary | ICD-10-CM | POA: Diagnosis not present

## 2021-01-02 DIAGNOSIS — I6523 Occlusion and stenosis of bilateral carotid arteries: Secondary | ICD-10-CM | POA: Diagnosis not present

## 2021-01-02 DIAGNOSIS — M4800 Spinal stenosis, site unspecified: Secondary | ICD-10-CM | POA: Diagnosis not present

## 2021-01-02 DIAGNOSIS — E119 Type 2 diabetes mellitus without complications: Secondary | ICD-10-CM | POA: Diagnosis not present

## 2021-01-02 DIAGNOSIS — M47812 Spondylosis without myelopathy or radiculopathy, cervical region: Secondary | ICD-10-CM | POA: Diagnosis not present

## 2021-01-02 DIAGNOSIS — G43909 Migraine, unspecified, not intractable, without status migrainosus: Secondary | ICD-10-CM | POA: Diagnosis not present

## 2021-01-02 DIAGNOSIS — M810 Age-related osteoporosis without current pathological fracture: Secondary | ICD-10-CM | POA: Diagnosis not present

## 2021-01-02 DIAGNOSIS — I471 Supraventricular tachycardia: Secondary | ICD-10-CM | POA: Diagnosis not present

## 2021-01-02 DIAGNOSIS — I951 Orthostatic hypotension: Secondary | ICD-10-CM | POA: Diagnosis not present

## 2021-01-02 DIAGNOSIS — S52022D Displaced fracture of olecranon process without intraarticular extension of left ulna, subsequent encounter for closed fracture with routine healing: Secondary | ICD-10-CM | POA: Diagnosis not present

## 2021-01-02 DIAGNOSIS — E871 Hypo-osmolality and hyponatremia: Secondary | ICD-10-CM | POA: Diagnosis not present

## 2021-01-02 DIAGNOSIS — M5481 Occipital neuralgia: Secondary | ICD-10-CM | POA: Diagnosis not present

## 2021-01-02 DIAGNOSIS — I5043 Acute on chronic combined systolic (congestive) and diastolic (congestive) heart failure: Secondary | ICD-10-CM | POA: Diagnosis not present

## 2021-01-02 DIAGNOSIS — M47816 Spondylosis without myelopathy or radiculopathy, lumbar region: Secondary | ICD-10-CM | POA: Diagnosis not present

## 2021-01-02 DIAGNOSIS — I272 Pulmonary hypertension, unspecified: Secondary | ICD-10-CM | POA: Diagnosis not present

## 2021-01-02 DIAGNOSIS — G9341 Metabolic encephalopathy: Secondary | ICD-10-CM | POA: Diagnosis not present

## 2021-01-02 DIAGNOSIS — E039 Hypothyroidism, unspecified: Secondary | ICD-10-CM | POA: Diagnosis not present

## 2021-01-02 DIAGNOSIS — E782 Mixed hyperlipidemia: Secondary | ICD-10-CM | POA: Diagnosis not present

## 2021-01-02 DIAGNOSIS — J449 Chronic obstructive pulmonary disease, unspecified: Secondary | ICD-10-CM | POA: Diagnosis not present

## 2021-01-02 DIAGNOSIS — J9611 Chronic respiratory failure with hypoxia: Secondary | ICD-10-CM | POA: Diagnosis not present

## 2021-01-02 DIAGNOSIS — S82001D Unspecified fracture of right patella, subsequent encounter for closed fracture with routine healing: Secondary | ICD-10-CM | POA: Diagnosis not present

## 2021-01-02 MED ORDER — ALBUTEROL SULFATE HFA 108 (90 BASE) MCG/ACT IN AERS: INHALATION_SPRAY | RESPIRATORY_TRACT | 3 refills | Status: DC

## 2021-01-02 NOTE — Telephone Encounter (Signed)
Pt left message she need refill for albuterol pt advised that we send her refill to phar

## 2021-01-08 ENCOUNTER — Other Ambulatory Visit: Payer: Self-pay | Admitting: *Deleted

## 2021-01-08 DIAGNOSIS — I272 Pulmonary hypertension, unspecified: Secondary | ICD-10-CM | POA: Diagnosis not present

## 2021-01-08 DIAGNOSIS — E039 Hypothyroidism, unspecified: Secondary | ICD-10-CM | POA: Diagnosis not present

## 2021-01-08 DIAGNOSIS — E871 Hypo-osmolality and hyponatremia: Secondary | ICD-10-CM | POA: Diagnosis not present

## 2021-01-08 DIAGNOSIS — I6523 Occlusion and stenosis of bilateral carotid arteries: Secondary | ICD-10-CM | POA: Diagnosis not present

## 2021-01-08 DIAGNOSIS — E782 Mixed hyperlipidemia: Secondary | ICD-10-CM | POA: Diagnosis not present

## 2021-01-08 DIAGNOSIS — S82001D Unspecified fracture of right patella, subsequent encounter for closed fracture with routine healing: Secondary | ICD-10-CM | POA: Diagnosis not present

## 2021-01-08 DIAGNOSIS — M810 Age-related osteoporosis without current pathological fracture: Secondary | ICD-10-CM | POA: Diagnosis not present

## 2021-01-08 DIAGNOSIS — J9611 Chronic respiratory failure with hypoxia: Secondary | ICD-10-CM | POA: Diagnosis not present

## 2021-01-08 DIAGNOSIS — I5043 Acute on chronic combined systolic (congestive) and diastolic (congestive) heart failure: Secondary | ICD-10-CM | POA: Diagnosis not present

## 2021-01-08 DIAGNOSIS — E119 Type 2 diabetes mellitus without complications: Secondary | ICD-10-CM | POA: Diagnosis not present

## 2021-01-08 DIAGNOSIS — M47812 Spondylosis without myelopathy or radiculopathy, cervical region: Secondary | ICD-10-CM | POA: Diagnosis not present

## 2021-01-08 DIAGNOSIS — M4800 Spinal stenosis, site unspecified: Secondary | ICD-10-CM | POA: Diagnosis not present

## 2021-01-08 DIAGNOSIS — I951 Orthostatic hypotension: Secondary | ICD-10-CM | POA: Diagnosis not present

## 2021-01-08 DIAGNOSIS — M5136 Other intervertebral disc degeneration, lumbar region: Secondary | ICD-10-CM | POA: Diagnosis not present

## 2021-01-08 DIAGNOSIS — I471 Supraventricular tachycardia: Secondary | ICD-10-CM | POA: Diagnosis not present

## 2021-01-08 DIAGNOSIS — J449 Chronic obstructive pulmonary disease, unspecified: Secondary | ICD-10-CM | POA: Diagnosis not present

## 2021-01-08 DIAGNOSIS — M47816 Spondylosis without myelopathy or radiculopathy, lumbar region: Secondary | ICD-10-CM | POA: Diagnosis not present

## 2021-01-08 DIAGNOSIS — G9341 Metabolic encephalopathy: Secondary | ICD-10-CM | POA: Diagnosis not present

## 2021-01-08 DIAGNOSIS — S52022D Displaced fracture of olecranon process without intraarticular extension of left ulna, subsequent encounter for closed fracture with routine healing: Secondary | ICD-10-CM | POA: Diagnosis not present

## 2021-01-08 DIAGNOSIS — G43909 Migraine, unspecified, not intractable, without status migrainosus: Secondary | ICD-10-CM | POA: Diagnosis not present

## 2021-01-08 DIAGNOSIS — M5481 Occipital neuralgia: Secondary | ICD-10-CM | POA: Diagnosis not present

## 2021-01-08 NOTE — Patient Outreach (Addendum)
[facility]) Care Management  01/08/2021  [name] 10/18/1945 [id number]   [facility] Care coordination- unsuccessful outreach  Outreach attempt to patient to reschedule 01/13/21 appointment No answer Not able to leave a message at only number listed as the home number for pt No voice mail box No e-mail Unsuccessful letter sent to patient with the rescheduled appointment time and date    Plan Rescheduled appointment [facility] RN CM will follow up with patient within the 30 business days  Evani Shrider L. Noelle Penner, RN, BSN, CCM [facility] Telephonic Care Management Care Coordinator Office number [phone] Mobile number [phone]  Main [facility] number [phone] Fax number [phone]

## 2021-01-09 DIAGNOSIS — S42402D Unspecified fracture of lower end of left humerus, subsequent encounter for fracture with routine healing: Secondary | ICD-10-CM | POA: Diagnosis not present

## 2021-01-09 DIAGNOSIS — M25561 Pain in right knee: Secondary | ICD-10-CM | POA: Diagnosis not present

## 2021-01-09 DIAGNOSIS — S82009D Unspecified fracture of unspecified patella, subsequent encounter for closed fracture with routine healing: Secondary | ICD-10-CM | POA: Diagnosis not present

## 2021-01-10 DIAGNOSIS — G9341 Metabolic encephalopathy: Secondary | ICD-10-CM | POA: Diagnosis not present

## 2021-01-10 DIAGNOSIS — I6523 Occlusion and stenosis of bilateral carotid arteries: Secondary | ICD-10-CM | POA: Diagnosis not present

## 2021-01-10 DIAGNOSIS — I951 Orthostatic hypotension: Secondary | ICD-10-CM | POA: Diagnosis not present

## 2021-01-10 DIAGNOSIS — M47812 Spondylosis without myelopathy or radiculopathy, cervical region: Secondary | ICD-10-CM | POA: Diagnosis not present

## 2021-01-10 DIAGNOSIS — M5481 Occipital neuralgia: Secondary | ICD-10-CM | POA: Diagnosis not present

## 2021-01-10 DIAGNOSIS — I272 Pulmonary hypertension, unspecified: Secondary | ICD-10-CM | POA: Diagnosis not present

## 2021-01-10 DIAGNOSIS — E119 Type 2 diabetes mellitus without complications: Secondary | ICD-10-CM | POA: Diagnosis not present

## 2021-01-10 DIAGNOSIS — I471 Supraventricular tachycardia: Secondary | ICD-10-CM | POA: Diagnosis not present

## 2021-01-10 DIAGNOSIS — E871 Hypo-osmolality and hyponatremia: Secondary | ICD-10-CM | POA: Diagnosis not present

## 2021-01-10 DIAGNOSIS — S82001D Unspecified fracture of right patella, subsequent encounter for closed fracture with routine healing: Secondary | ICD-10-CM | POA: Diagnosis not present

## 2021-01-10 DIAGNOSIS — J9611 Chronic respiratory failure with hypoxia: Secondary | ICD-10-CM | POA: Diagnosis not present

## 2021-01-10 DIAGNOSIS — J449 Chronic obstructive pulmonary disease, unspecified: Secondary | ICD-10-CM | POA: Diagnosis not present

## 2021-01-10 DIAGNOSIS — G43909 Migraine, unspecified, not intractable, without status migrainosus: Secondary | ICD-10-CM | POA: Diagnosis not present

## 2021-01-10 DIAGNOSIS — S52022D Displaced fracture of olecranon process without intraarticular extension of left ulna, subsequent encounter for closed fracture with routine healing: Secondary | ICD-10-CM | POA: Diagnosis not present

## 2021-01-10 DIAGNOSIS — E782 Mixed hyperlipidemia: Secondary | ICD-10-CM | POA: Diagnosis not present

## 2021-01-10 DIAGNOSIS — M4800 Spinal stenosis, site unspecified: Secondary | ICD-10-CM | POA: Diagnosis not present

## 2021-01-10 DIAGNOSIS — M5136 Other intervertebral disc degeneration, lumbar region: Secondary | ICD-10-CM | POA: Diagnosis not present

## 2021-01-10 DIAGNOSIS — E039 Hypothyroidism, unspecified: Secondary | ICD-10-CM | POA: Diagnosis not present

## 2021-01-10 DIAGNOSIS — M810 Age-related osteoporosis without current pathological fracture: Secondary | ICD-10-CM | POA: Diagnosis not present

## 2021-01-10 DIAGNOSIS — M47816 Spondylosis without myelopathy or radiculopathy, lumbar region: Secondary | ICD-10-CM | POA: Diagnosis not present

## 2021-01-10 DIAGNOSIS — I5043 Acute on chronic combined systolic (congestive) and diastolic (congestive) heart failure: Secondary | ICD-10-CM | POA: Diagnosis not present

## 2021-01-12 DIAGNOSIS — M1990 Unspecified osteoarthritis, unspecified site: Secondary | ICD-10-CM | POA: Diagnosis not present

## 2021-01-12 DIAGNOSIS — J9611 Chronic respiratory failure with hypoxia: Secondary | ICD-10-CM | POA: Diagnosis not present

## 2021-01-12 DIAGNOSIS — J449 Chronic obstructive pulmonary disease, unspecified: Secondary | ICD-10-CM | POA: Diagnosis not present

## 2021-01-12 DIAGNOSIS — R296 Repeated falls: Secondary | ICD-10-CM | POA: Diagnosis not present

## 2021-01-13 ENCOUNTER — Ambulatory Visit: Payer: Medicare Other | Admitting: *Deleted

## 2021-01-15 DIAGNOSIS — S82001D Unspecified fracture of right patella, subsequent encounter for closed fracture with routine healing: Secondary | ICD-10-CM | POA: Diagnosis not present

## 2021-01-15 DIAGNOSIS — S52022D Displaced fracture of olecranon process without intraarticular extension of left ulna, subsequent encounter for closed fracture with routine healing: Secondary | ICD-10-CM | POA: Diagnosis not present

## 2021-01-15 DIAGNOSIS — E119 Type 2 diabetes mellitus without complications: Secondary | ICD-10-CM | POA: Diagnosis not present

## 2021-01-15 DIAGNOSIS — E039 Hypothyroidism, unspecified: Secondary | ICD-10-CM | POA: Diagnosis not present

## 2021-01-15 DIAGNOSIS — M47816 Spondylosis without myelopathy or radiculopathy, lumbar region: Secondary | ICD-10-CM | POA: Diagnosis not present

## 2021-01-15 DIAGNOSIS — G43909 Migraine, unspecified, not intractable, without status migrainosus: Secondary | ICD-10-CM | POA: Diagnosis not present

## 2021-01-15 DIAGNOSIS — M5481 Occipital neuralgia: Secondary | ICD-10-CM | POA: Diagnosis not present

## 2021-01-15 DIAGNOSIS — J449 Chronic obstructive pulmonary disease, unspecified: Secondary | ICD-10-CM | POA: Diagnosis not present

## 2021-01-15 DIAGNOSIS — E871 Hypo-osmolality and hyponatremia: Secondary | ICD-10-CM | POA: Diagnosis not present

## 2021-01-15 DIAGNOSIS — G9341 Metabolic encephalopathy: Secondary | ICD-10-CM | POA: Diagnosis not present

## 2021-01-15 DIAGNOSIS — I5043 Acute on chronic combined systolic (congestive) and diastolic (congestive) heart failure: Secondary | ICD-10-CM | POA: Diagnosis not present

## 2021-01-15 DIAGNOSIS — E782 Mixed hyperlipidemia: Secondary | ICD-10-CM | POA: Diagnosis not present

## 2021-01-15 DIAGNOSIS — J9611 Chronic respiratory failure with hypoxia: Secondary | ICD-10-CM | POA: Diagnosis not present

## 2021-01-15 DIAGNOSIS — I951 Orthostatic hypotension: Secondary | ICD-10-CM | POA: Diagnosis not present

## 2021-01-15 DIAGNOSIS — M4800 Spinal stenosis, site unspecified: Secondary | ICD-10-CM | POA: Diagnosis not present

## 2021-01-15 DIAGNOSIS — M810 Age-related osteoporosis without current pathological fracture: Secondary | ICD-10-CM | POA: Diagnosis not present

## 2021-01-15 DIAGNOSIS — I471 Supraventricular tachycardia: Secondary | ICD-10-CM | POA: Diagnosis not present

## 2021-01-15 DIAGNOSIS — M5136 Other intervertebral disc degeneration, lumbar region: Secondary | ICD-10-CM | POA: Diagnosis not present

## 2021-01-15 DIAGNOSIS — I272 Pulmonary hypertension, unspecified: Secondary | ICD-10-CM | POA: Diagnosis not present

## 2021-01-15 DIAGNOSIS — I6523 Occlusion and stenosis of bilateral carotid arteries: Secondary | ICD-10-CM | POA: Diagnosis not present

## 2021-01-15 DIAGNOSIS — M47812 Spondylosis without myelopathy or radiculopathy, cervical region: Secondary | ICD-10-CM | POA: Diagnosis not present

## 2021-01-16 DIAGNOSIS — M47816 Spondylosis without myelopathy or radiculopathy, lumbar region: Secondary | ICD-10-CM | POA: Diagnosis not present

## 2021-01-16 DIAGNOSIS — I471 Supraventricular tachycardia: Secondary | ICD-10-CM | POA: Diagnosis not present

## 2021-01-16 DIAGNOSIS — I951 Orthostatic hypotension: Secondary | ICD-10-CM | POA: Diagnosis not present

## 2021-01-16 DIAGNOSIS — M47812 Spondylosis without myelopathy or radiculopathy, cervical region: Secondary | ICD-10-CM | POA: Diagnosis not present

## 2021-01-16 DIAGNOSIS — S82001D Unspecified fracture of right patella, subsequent encounter for closed fracture with routine healing: Secondary | ICD-10-CM | POA: Diagnosis not present

## 2021-01-16 DIAGNOSIS — E782 Mixed hyperlipidemia: Secondary | ICD-10-CM | POA: Diagnosis not present

## 2021-01-16 DIAGNOSIS — M810 Age-related osteoporosis without current pathological fracture: Secondary | ICD-10-CM | POA: Diagnosis not present

## 2021-01-16 DIAGNOSIS — E119 Type 2 diabetes mellitus without complications: Secondary | ICD-10-CM | POA: Diagnosis not present

## 2021-01-16 DIAGNOSIS — J9611 Chronic respiratory failure with hypoxia: Secondary | ICD-10-CM | POA: Diagnosis not present

## 2021-01-16 DIAGNOSIS — E039 Hypothyroidism, unspecified: Secondary | ICD-10-CM | POA: Diagnosis not present

## 2021-01-16 DIAGNOSIS — I6523 Occlusion and stenosis of bilateral carotid arteries: Secondary | ICD-10-CM | POA: Diagnosis not present

## 2021-01-16 DIAGNOSIS — M4800 Spinal stenosis, site unspecified: Secondary | ICD-10-CM | POA: Diagnosis not present

## 2021-01-16 DIAGNOSIS — M5481 Occipital neuralgia: Secondary | ICD-10-CM | POA: Diagnosis not present

## 2021-01-16 DIAGNOSIS — G9341 Metabolic encephalopathy: Secondary | ICD-10-CM | POA: Diagnosis not present

## 2021-01-16 DIAGNOSIS — S52022D Displaced fracture of olecranon process without intraarticular extension of left ulna, subsequent encounter for closed fracture with routine healing: Secondary | ICD-10-CM | POA: Diagnosis not present

## 2021-01-16 DIAGNOSIS — G43909 Migraine, unspecified, not intractable, without status migrainosus: Secondary | ICD-10-CM | POA: Diagnosis not present

## 2021-01-16 DIAGNOSIS — E871 Hypo-osmolality and hyponatremia: Secondary | ICD-10-CM | POA: Diagnosis not present

## 2021-01-16 DIAGNOSIS — I5043 Acute on chronic combined systolic (congestive) and diastolic (congestive) heart failure: Secondary | ICD-10-CM | POA: Diagnosis not present

## 2021-01-16 DIAGNOSIS — I272 Pulmonary hypertension, unspecified: Secondary | ICD-10-CM | POA: Diagnosis not present

## 2021-01-16 DIAGNOSIS — J449 Chronic obstructive pulmonary disease, unspecified: Secondary | ICD-10-CM | POA: Diagnosis not present

## 2021-01-16 DIAGNOSIS — M5136 Other intervertebral disc degeneration, lumbar region: Secondary | ICD-10-CM | POA: Diagnosis not present

## 2021-01-17 ENCOUNTER — Other Ambulatory Visit: Payer: Self-pay | Admitting: *Deleted

## 2021-01-17 NOTE — Patient Outreach (Signed)
[facility]) Care Management  01/17/2021  ARIJANA NARAYAN 06/13/1945 [id number]  [facility] second unsuccessful outreach for post hospital referred patient follow up   [name] was referred to [facility] for post hospital outreach on 12/20/20. After a few unsuccessful attempts she was reached today 12/27/20 for initial telephone assessment She was again not reachable on 01/08/21 No answer   Insurance united healthcare medicare, medicaid [place] access   01/17/10 No answer Not able to leave a message at only number listed as the home number for pt No voice mail box No e-mail    Plan RN CM will attempt another follow up outreach to patient within the next 4-7 business days   Devota Viruet L. Noelle Penner, RN, BSN, CCM [facility] Telephonic Care Management Care Coordinator Office number [phone] Mobile number [phone]  Main [facility] number [phone] Fax number [phone]

## 2021-01-22 DIAGNOSIS — M47816 Spondylosis without myelopathy or radiculopathy, lumbar region: Secondary | ICD-10-CM | POA: Diagnosis not present

## 2021-01-22 DIAGNOSIS — M4800 Spinal stenosis, site unspecified: Secondary | ICD-10-CM | POA: Diagnosis not present

## 2021-01-22 DIAGNOSIS — M47812 Spondylosis without myelopathy or radiculopathy, cervical region: Secondary | ICD-10-CM | POA: Diagnosis not present

## 2021-01-22 DIAGNOSIS — E039 Hypothyroidism, unspecified: Secondary | ICD-10-CM | POA: Diagnosis not present

## 2021-01-22 DIAGNOSIS — I471 Supraventricular tachycardia: Secondary | ICD-10-CM | POA: Diagnosis not present

## 2021-01-22 DIAGNOSIS — M810 Age-related osteoporosis without current pathological fracture: Secondary | ICD-10-CM | POA: Diagnosis not present

## 2021-01-22 DIAGNOSIS — E782 Mixed hyperlipidemia: Secondary | ICD-10-CM | POA: Diagnosis not present

## 2021-01-22 DIAGNOSIS — E119 Type 2 diabetes mellitus without complications: Secondary | ICD-10-CM | POA: Diagnosis not present

## 2021-01-22 DIAGNOSIS — J449 Chronic obstructive pulmonary disease, unspecified: Secondary | ICD-10-CM | POA: Diagnosis not present

## 2021-01-22 DIAGNOSIS — M5136 Other intervertebral disc degeneration, lumbar region: Secondary | ICD-10-CM | POA: Diagnosis not present

## 2021-01-22 DIAGNOSIS — G43909 Migraine, unspecified, not intractable, without status migrainosus: Secondary | ICD-10-CM | POA: Diagnosis not present

## 2021-01-22 DIAGNOSIS — I951 Orthostatic hypotension: Secondary | ICD-10-CM | POA: Diagnosis not present

## 2021-01-22 DIAGNOSIS — E871 Hypo-osmolality and hyponatremia: Secondary | ICD-10-CM | POA: Diagnosis not present

## 2021-01-22 DIAGNOSIS — J9611 Chronic respiratory failure with hypoxia: Secondary | ICD-10-CM | POA: Diagnosis not present

## 2021-01-22 DIAGNOSIS — I272 Pulmonary hypertension, unspecified: Secondary | ICD-10-CM | POA: Diagnosis not present

## 2021-01-22 DIAGNOSIS — G9341 Metabolic encephalopathy: Secondary | ICD-10-CM | POA: Diagnosis not present

## 2021-01-22 DIAGNOSIS — M5481 Occipital neuralgia: Secondary | ICD-10-CM | POA: Diagnosis not present

## 2021-01-22 DIAGNOSIS — I5043 Acute on chronic combined systolic (congestive) and diastolic (congestive) heart failure: Secondary | ICD-10-CM | POA: Diagnosis not present

## 2021-01-22 DIAGNOSIS — I6523 Occlusion and stenosis of bilateral carotid arteries: Secondary | ICD-10-CM | POA: Diagnosis not present

## 2021-01-22 DIAGNOSIS — S82001D Unspecified fracture of right patella, subsequent encounter for closed fracture with routine healing: Secondary | ICD-10-CM | POA: Diagnosis not present

## 2021-01-22 DIAGNOSIS — S52022D Displaced fracture of olecranon process without intraarticular extension of left ulna, subsequent encounter for closed fracture with routine healing: Secondary | ICD-10-CM | POA: Diagnosis not present

## 2021-01-23 DIAGNOSIS — I6523 Occlusion and stenosis of bilateral carotid arteries: Secondary | ICD-10-CM | POA: Diagnosis not present

## 2021-01-23 DIAGNOSIS — I471 Supraventricular tachycardia: Secondary | ICD-10-CM | POA: Diagnosis not present

## 2021-01-23 DIAGNOSIS — S52022D Displaced fracture of olecranon process without intraarticular extension of left ulna, subsequent encounter for closed fracture with routine healing: Secondary | ICD-10-CM | POA: Diagnosis not present

## 2021-01-23 DIAGNOSIS — G43909 Migraine, unspecified, not intractable, without status migrainosus: Secondary | ICD-10-CM | POA: Diagnosis not present

## 2021-01-23 DIAGNOSIS — I272 Pulmonary hypertension, unspecified: Secondary | ICD-10-CM | POA: Diagnosis not present

## 2021-01-23 DIAGNOSIS — M810 Age-related osteoporosis without current pathological fracture: Secondary | ICD-10-CM | POA: Diagnosis not present

## 2021-01-23 DIAGNOSIS — S82001D Unspecified fracture of right patella, subsequent encounter for closed fracture with routine healing: Secondary | ICD-10-CM | POA: Diagnosis not present

## 2021-01-23 DIAGNOSIS — J9611 Chronic respiratory failure with hypoxia: Secondary | ICD-10-CM | POA: Diagnosis not present

## 2021-01-23 DIAGNOSIS — M47816 Spondylosis without myelopathy or radiculopathy, lumbar region: Secondary | ICD-10-CM | POA: Diagnosis not present

## 2021-01-23 DIAGNOSIS — E119 Type 2 diabetes mellitus without complications: Secondary | ICD-10-CM | POA: Diagnosis not present

## 2021-01-23 DIAGNOSIS — M4800 Spinal stenosis, site unspecified: Secondary | ICD-10-CM | POA: Diagnosis not present

## 2021-01-23 DIAGNOSIS — E039 Hypothyroidism, unspecified: Secondary | ICD-10-CM | POA: Diagnosis not present

## 2021-01-23 DIAGNOSIS — J449 Chronic obstructive pulmonary disease, unspecified: Secondary | ICD-10-CM | POA: Diagnosis not present

## 2021-01-23 DIAGNOSIS — M5136 Other intervertebral disc degeneration, lumbar region: Secondary | ICD-10-CM | POA: Diagnosis not present

## 2021-01-23 DIAGNOSIS — M5481 Occipital neuralgia: Secondary | ICD-10-CM | POA: Diagnosis not present

## 2021-01-23 DIAGNOSIS — I951 Orthostatic hypotension: Secondary | ICD-10-CM | POA: Diagnosis not present

## 2021-01-23 DIAGNOSIS — E782 Mixed hyperlipidemia: Secondary | ICD-10-CM | POA: Diagnosis not present

## 2021-01-23 DIAGNOSIS — E871 Hypo-osmolality and hyponatremia: Secondary | ICD-10-CM | POA: Diagnosis not present

## 2021-01-23 DIAGNOSIS — M47812 Spondylosis without myelopathy or radiculopathy, cervical region: Secondary | ICD-10-CM | POA: Diagnosis not present

## 2021-01-23 DIAGNOSIS — G9341 Metabolic encephalopathy: Secondary | ICD-10-CM | POA: Diagnosis not present

## 2021-01-23 DIAGNOSIS — I5043 Acute on chronic combined systolic (congestive) and diastolic (congestive) heart failure: Secondary | ICD-10-CM | POA: Diagnosis not present

## 2021-01-24 ENCOUNTER — Telehealth: Payer: Self-pay

## 2021-01-24 DIAGNOSIS — E871 Hypo-osmolality and hyponatremia: Secondary | ICD-10-CM | POA: Diagnosis not present

## 2021-01-24 DIAGNOSIS — I272 Pulmonary hypertension, unspecified: Secondary | ICD-10-CM | POA: Diagnosis not present

## 2021-01-24 DIAGNOSIS — S82001D Unspecified fracture of right patella, subsequent encounter for closed fracture with routine healing: Secondary | ICD-10-CM | POA: Diagnosis not present

## 2021-01-24 DIAGNOSIS — G43909 Migraine, unspecified, not intractable, without status migrainosus: Secondary | ICD-10-CM | POA: Diagnosis not present

## 2021-01-24 DIAGNOSIS — I6523 Occlusion and stenosis of bilateral carotid arteries: Secondary | ICD-10-CM | POA: Diagnosis not present

## 2021-01-24 DIAGNOSIS — I5043 Acute on chronic combined systolic (congestive) and diastolic (congestive) heart failure: Secondary | ICD-10-CM | POA: Diagnosis not present

## 2021-01-24 DIAGNOSIS — M47816 Spondylosis without myelopathy or radiculopathy, lumbar region: Secondary | ICD-10-CM | POA: Diagnosis not present

## 2021-01-24 DIAGNOSIS — M5481 Occipital neuralgia: Secondary | ICD-10-CM | POA: Diagnosis not present

## 2021-01-24 DIAGNOSIS — E782 Mixed hyperlipidemia: Secondary | ICD-10-CM | POA: Diagnosis not present

## 2021-01-24 DIAGNOSIS — I951 Orthostatic hypotension: Secondary | ICD-10-CM | POA: Diagnosis not present

## 2021-01-24 DIAGNOSIS — M47812 Spondylosis without myelopathy or radiculopathy, cervical region: Secondary | ICD-10-CM | POA: Diagnosis not present

## 2021-01-24 DIAGNOSIS — M4800 Spinal stenosis, site unspecified: Secondary | ICD-10-CM | POA: Diagnosis not present

## 2021-01-24 DIAGNOSIS — S52022D Displaced fracture of olecranon process without intraarticular extension of left ulna, subsequent encounter for closed fracture with routine healing: Secondary | ICD-10-CM | POA: Diagnosis not present

## 2021-01-24 DIAGNOSIS — E039 Hypothyroidism, unspecified: Secondary | ICD-10-CM | POA: Diagnosis not present

## 2021-01-24 DIAGNOSIS — J449 Chronic obstructive pulmonary disease, unspecified: Secondary | ICD-10-CM | POA: Diagnosis not present

## 2021-01-24 DIAGNOSIS — M810 Age-related osteoporosis without current pathological fracture: Secondary | ICD-10-CM | POA: Diagnosis not present

## 2021-01-24 DIAGNOSIS — J9611 Chronic respiratory failure with hypoxia: Secondary | ICD-10-CM | POA: Diagnosis not present

## 2021-01-24 DIAGNOSIS — E119 Type 2 diabetes mellitus without complications: Secondary | ICD-10-CM | POA: Diagnosis not present

## 2021-01-24 DIAGNOSIS — I471 Supraventricular tachycardia: Secondary | ICD-10-CM | POA: Diagnosis not present

## 2021-01-24 DIAGNOSIS — G9341 Metabolic encephalopathy: Secondary | ICD-10-CM | POA: Diagnosis not present

## 2021-01-24 DIAGNOSIS — M5136 Other intervertebral disc degeneration, lumbar region: Secondary | ICD-10-CM | POA: Diagnosis not present

## 2021-01-24 NOTE — Telephone Encounter (Signed)
Oxygen therapy order signed by provider and placed in [facility] folder. 

## 2021-01-25 DIAGNOSIS — J449 Chronic obstructive pulmonary disease, unspecified: Secondary | ICD-10-CM | POA: Diagnosis not present

## 2021-01-27 ENCOUNTER — Other Ambulatory Visit: Payer: Self-pay | Admitting: *Deleted

## 2021-01-27 DIAGNOSIS — R0602 Shortness of breath: Secondary | ICD-10-CM | POA: Diagnosis not present

## 2021-01-27 NOTE — Patient Outreach (Signed)
[facility]) Care Management  01/27/2021  ROHINI JAROSZEWSKI December 09, 1945 [id number]   [facility] third unsuccessful outreach for post hospital referred patient follow up   Mrs TONDA WIEDERHOLD was referred to [facility] for post hospital outreach on 12/20/20. After a few unsuccessful attempts she was reached today 12/27/20 for initial telephone assessment She was again not reachable on 01/08/21, & 01/17/21 No answer An unsuccessful outreach letter was sent to her on 01/08/21  Insurance united healthcare medicare, medicaid [place] access     01/27/21 No answer Not able to leave a message at only number listed as the home number [phone] for pt No voice mail box No e-mail   spoke with Sheralyn Boatman at [place] [facility] [place] office [phone] who confirms RN CM has been dialing the correct home number of [phone]  Sheralyn Boatman also states she has called pt several times without a response [facility] indicates no skilled nursing facility (snf) visits in the last month  Plan RN CM will follow [facility] case closure workflow  Pending case closure  Darnise Montag L. Noelle Penner, RN, BSN, CCM [facility] Telephonic Care Management Care Coordinator Office number [phone] Main [facility] number [phone] Fax number [phone]

## 2021-01-28 DIAGNOSIS — S52022D Displaced fracture of olecranon process without intraarticular extension of left ulna, subsequent encounter for closed fracture with routine healing: Secondary | ICD-10-CM | POA: Diagnosis not present

## 2021-01-28 DIAGNOSIS — I272 Pulmonary hypertension, unspecified: Secondary | ICD-10-CM | POA: Diagnosis not present

## 2021-01-28 DIAGNOSIS — E871 Hypo-osmolality and hyponatremia: Secondary | ICD-10-CM | POA: Diagnosis not present

## 2021-01-28 DIAGNOSIS — J449 Chronic obstructive pulmonary disease, unspecified: Secondary | ICD-10-CM | POA: Diagnosis not present

## 2021-01-28 DIAGNOSIS — I6523 Occlusion and stenosis of bilateral carotid arteries: Secondary | ICD-10-CM | POA: Diagnosis not present

## 2021-01-28 DIAGNOSIS — I951 Orthostatic hypotension: Secondary | ICD-10-CM | POA: Diagnosis not present

## 2021-01-28 DIAGNOSIS — E119 Type 2 diabetes mellitus without complications: Secondary | ICD-10-CM | POA: Diagnosis not present

## 2021-01-28 DIAGNOSIS — J9611 Chronic respiratory failure with hypoxia: Secondary | ICD-10-CM | POA: Diagnosis not present

## 2021-01-28 DIAGNOSIS — S82001D Unspecified fracture of right patella, subsequent encounter for closed fracture with routine healing: Secondary | ICD-10-CM | POA: Diagnosis not present

## 2021-01-28 DIAGNOSIS — E782 Mixed hyperlipidemia: Secondary | ICD-10-CM | POA: Diagnosis not present

## 2021-01-28 DIAGNOSIS — M47816 Spondylosis without myelopathy or radiculopathy, lumbar region: Secondary | ICD-10-CM | POA: Diagnosis not present

## 2021-01-28 DIAGNOSIS — I5043 Acute on chronic combined systolic (congestive) and diastolic (congestive) heart failure: Secondary | ICD-10-CM | POA: Diagnosis not present

## 2021-01-28 DIAGNOSIS — M4800 Spinal stenosis, site unspecified: Secondary | ICD-10-CM | POA: Diagnosis not present

## 2021-01-28 DIAGNOSIS — E039 Hypothyroidism, unspecified: Secondary | ICD-10-CM | POA: Diagnosis not present

## 2021-01-28 DIAGNOSIS — M5481 Occipital neuralgia: Secondary | ICD-10-CM | POA: Diagnosis not present

## 2021-01-28 DIAGNOSIS — M5136 Other intervertebral disc degeneration, lumbar region: Secondary | ICD-10-CM | POA: Diagnosis not present

## 2021-01-28 DIAGNOSIS — I471 Supraventricular tachycardia: Secondary | ICD-10-CM | POA: Diagnosis not present

## 2021-01-28 DIAGNOSIS — G9341 Metabolic encephalopathy: Secondary | ICD-10-CM | POA: Diagnosis not present

## 2021-01-28 DIAGNOSIS — M810 Age-related osteoporosis without current pathological fracture: Secondary | ICD-10-CM | POA: Diagnosis not present

## 2021-01-28 DIAGNOSIS — M47812 Spondylosis without myelopathy or radiculopathy, cervical region: Secondary | ICD-10-CM | POA: Diagnosis not present

## 2021-01-28 DIAGNOSIS — G43909 Migraine, unspecified, not intractable, without status migrainosus: Secondary | ICD-10-CM | POA: Diagnosis not present

## 2021-01-28 NOTE — Progress Notes (Deleted)
   Patient ID: BLANCHE SCOVELL, female    DOB: [birth year], 75 y.o.   MRN: [id number]  HPI  Ms Bollen is a 75 y/o female with a history of  Echo report from 12/15/20 reviewed and showed an EF of 60-65% with mild MR but without LVH.   LHC/RHC done 04/08/20 and showed:  There is hyperdynamic left ventricular systolic function. LV end diastolic pressure is mildly elevated. The left ventricular ejection fraction is greater than 65% by visual estimate. Prox Cx to Mid Cx lesion is 20% stenosed.  1.  Mildly calcified coronary arteries with mild nonobstructive disease. 2.  Hyperdynamic LV systolic function with an EF of greater than 70%. 3.  Right heart catheterization showed mildly elevated filling pressures, mild pulmonary hypertension and normal cardiac output.  RA: 10 mmHg RV: 50/5 mmHg PCW: 16 mmHg PA: 43/17 with a mean of 30 mmHg Cardiac output: 4.40 with a cardiac index of 2.76.  Admitted 12/14/20 due to shortness of breath due to HF exacerbation. Initially given IV lasix with transition to oral diuretics. Chest CTA negative for PE. CPAP ordered for pulmonary HTN. Discharged after 3 days.   She presents today for her initial visit with a chief complaint of   Review of Systems    Physical Exam  Assessment & Plan:  1: Chronic heart failure with preserved ejection fraction without structural changes- - NYHA class  2: HTN- - BP  3: DM-  4: COPD-

## 2021-01-30 ENCOUNTER — Telehealth: Payer: Self-pay

## 2021-01-30 ENCOUNTER — Ambulatory Visit: Payer: Medicare Other | Admitting: Family

## 2021-01-30 NOTE — Telephone Encounter (Signed)
[facility] [phone] called that they are unable to see pt today due to she was sick they will call her back and reschedule for next week

## 2021-02-03 DIAGNOSIS — Z9889 Other specified postprocedural states: Secondary | ICD-10-CM | POA: Diagnosis not present

## 2021-02-03 DIAGNOSIS — S52032D Displaced fracture of olecranon process with intraarticular extension of left ulna, subsequent encounter for closed fracture with routine healing: Secondary | ICD-10-CM | POA: Diagnosis not present

## 2021-02-03 DIAGNOSIS — S82034D Nondisplaced transverse fracture of right patella, subsequent encounter for closed fracture with routine healing: Secondary | ICD-10-CM | POA: Diagnosis not present

## 2021-02-05 DIAGNOSIS — J9611 Chronic respiratory failure with hypoxia: Secondary | ICD-10-CM | POA: Diagnosis not present

## 2021-02-05 DIAGNOSIS — J449 Chronic obstructive pulmonary disease, unspecified: Secondary | ICD-10-CM | POA: Diagnosis not present

## 2021-02-05 DIAGNOSIS — I272 Pulmonary hypertension, unspecified: Secondary | ICD-10-CM | POA: Diagnosis not present

## 2021-02-05 DIAGNOSIS — I5043 Acute on chronic combined systolic (congestive) and diastolic (congestive) heart failure: Secondary | ICD-10-CM | POA: Diagnosis not present

## 2021-02-05 DIAGNOSIS — I471 Supraventricular tachycardia: Secondary | ICD-10-CM | POA: Diagnosis not present

## 2021-02-05 DIAGNOSIS — M4800 Spinal stenosis, site unspecified: Secondary | ICD-10-CM | POA: Diagnosis not present

## 2021-02-05 DIAGNOSIS — M47812 Spondylosis without myelopathy or radiculopathy, cervical region: Secondary | ICD-10-CM | POA: Diagnosis not present

## 2021-02-05 DIAGNOSIS — G9341 Metabolic encephalopathy: Secondary | ICD-10-CM | POA: Diagnosis not present

## 2021-02-05 DIAGNOSIS — E871 Hypo-osmolality and hyponatremia: Secondary | ICD-10-CM | POA: Diagnosis not present

## 2021-02-05 DIAGNOSIS — E119 Type 2 diabetes mellitus without complications: Secondary | ICD-10-CM | POA: Diagnosis not present

## 2021-02-05 DIAGNOSIS — S52022D Displaced fracture of olecranon process without intraarticular extension of left ulna, subsequent encounter for closed fracture with routine healing: Secondary | ICD-10-CM | POA: Diagnosis not present

## 2021-02-05 DIAGNOSIS — M810 Age-related osteoporosis without current pathological fracture: Secondary | ICD-10-CM | POA: Diagnosis not present

## 2021-02-05 DIAGNOSIS — I6523 Occlusion and stenosis of bilateral carotid arteries: Secondary | ICD-10-CM | POA: Diagnosis not present

## 2021-02-05 DIAGNOSIS — G43909 Migraine, unspecified, not intractable, without status migrainosus: Secondary | ICD-10-CM | POA: Diagnosis not present

## 2021-02-05 DIAGNOSIS — M5136 Other intervertebral disc degeneration, lumbar region: Secondary | ICD-10-CM | POA: Diagnosis not present

## 2021-02-05 DIAGNOSIS — E039 Hypothyroidism, unspecified: Secondary | ICD-10-CM | POA: Diagnosis not present

## 2021-02-05 DIAGNOSIS — I951 Orthostatic hypotension: Secondary | ICD-10-CM | POA: Diagnosis not present

## 2021-02-05 DIAGNOSIS — M47816 Spondylosis without myelopathy or radiculopathy, lumbar region: Secondary | ICD-10-CM | POA: Diagnosis not present

## 2021-02-05 DIAGNOSIS — S82001D Unspecified fracture of right patella, subsequent encounter for closed fracture with routine healing: Secondary | ICD-10-CM | POA: Diagnosis not present

## 2021-02-05 DIAGNOSIS — E782 Mixed hyperlipidemia: Secondary | ICD-10-CM | POA: Diagnosis not present

## 2021-02-05 DIAGNOSIS — M5481 Occipital neuralgia: Secondary | ICD-10-CM | POA: Diagnosis not present

## 2021-02-06 DIAGNOSIS — J449 Chronic obstructive pulmonary disease, unspecified: Secondary | ICD-10-CM | POA: Diagnosis not present

## 2021-02-06 DIAGNOSIS — J9611 Chronic respiratory failure with hypoxia: Secondary | ICD-10-CM | POA: Diagnosis not present

## 2021-02-06 DIAGNOSIS — S82001D Unspecified fracture of right patella, subsequent encounter for closed fracture with routine healing: Secondary | ICD-10-CM | POA: Diagnosis not present

## 2021-02-06 DIAGNOSIS — E119 Type 2 diabetes mellitus without complications: Secondary | ICD-10-CM | POA: Diagnosis not present

## 2021-02-06 DIAGNOSIS — G43909 Migraine, unspecified, not intractable, without status migrainosus: Secondary | ICD-10-CM | POA: Diagnosis not present

## 2021-02-06 DIAGNOSIS — E039 Hypothyroidism, unspecified: Secondary | ICD-10-CM | POA: Diagnosis not present

## 2021-02-06 DIAGNOSIS — E782 Mixed hyperlipidemia: Secondary | ICD-10-CM | POA: Diagnosis not present

## 2021-02-06 DIAGNOSIS — I272 Pulmonary hypertension, unspecified: Secondary | ICD-10-CM | POA: Diagnosis not present

## 2021-02-06 DIAGNOSIS — M4800 Spinal stenosis, site unspecified: Secondary | ICD-10-CM | POA: Diagnosis not present

## 2021-02-06 DIAGNOSIS — S52022D Displaced fracture of olecranon process without intraarticular extension of left ulna, subsequent encounter for closed fracture with routine healing: Secondary | ICD-10-CM | POA: Diagnosis not present

## 2021-02-06 DIAGNOSIS — I6523 Occlusion and stenosis of bilateral carotid arteries: Secondary | ICD-10-CM | POA: Diagnosis not present

## 2021-02-06 DIAGNOSIS — M5481 Occipital neuralgia: Secondary | ICD-10-CM | POA: Diagnosis not present

## 2021-02-06 DIAGNOSIS — G9341 Metabolic encephalopathy: Secondary | ICD-10-CM | POA: Diagnosis not present

## 2021-02-06 DIAGNOSIS — I5043 Acute on chronic combined systolic (congestive) and diastolic (congestive) heart failure: Secondary | ICD-10-CM | POA: Diagnosis not present

## 2021-02-06 DIAGNOSIS — I471 Supraventricular tachycardia: Secondary | ICD-10-CM | POA: Diagnosis not present

## 2021-02-06 DIAGNOSIS — M810 Age-related osteoporosis without current pathological fracture: Secondary | ICD-10-CM | POA: Diagnosis not present

## 2021-02-06 DIAGNOSIS — M47816 Spondylosis without myelopathy or radiculopathy, lumbar region: Secondary | ICD-10-CM | POA: Diagnosis not present

## 2021-02-06 DIAGNOSIS — M47812 Spondylosis without myelopathy or radiculopathy, cervical region: Secondary | ICD-10-CM | POA: Diagnosis not present

## 2021-02-06 DIAGNOSIS — M5136 Other intervertebral disc degeneration, lumbar region: Secondary | ICD-10-CM | POA: Diagnosis not present

## 2021-02-06 DIAGNOSIS — I951 Orthostatic hypotension: Secondary | ICD-10-CM | POA: Diagnosis not present

## 2021-02-06 DIAGNOSIS — E871 Hypo-osmolality and hyponatremia: Secondary | ICD-10-CM | POA: Diagnosis not present

## 2021-02-10 ENCOUNTER — Other Ambulatory Visit: Payer: Self-pay | Admitting: *Deleted

## 2021-02-10 NOTE — Patient Outreach (Addendum)
[facility]) Care Management  02/10/2021  FLORESTINE CARMICAL 09-Sep-1945 [id number]   [facility] Case closure  Mrs AYISHA POL was referred to [facility] for post hospital outreach on 12/20/20. After a few unsuccessful attempts she was reached on 12/27/20 for initial telephone assessment She was again not reachable on 01/08/21, 01/17/21 & 01/27/21 No answer An unsuccessful outreach letter was sent to her on 01/08/21   Insurance united healthcare medicare, [place]  access     01/27/21 spoke with Sheralyn Boatman at [place] [facility] [place] office [phone] who confirms RN CM has been dialing the correct home number of [phone]  Sheralyn Boatman also states she has called pt several times without a response [facility] indicates no skilled nursing facility (snf) visits in the last month   Plan [facility] RN CM will close case after no response from patient . Unable to reach Case closure letters sent to patient and MD  [name], RN, BSN, CCM [facility] Telephonic Care Management Care Coordinator Office number [phone] Mobile number [phone]  Main [facility] number [phone] Fax number [phone]

## 2021-02-11 DIAGNOSIS — I272 Pulmonary hypertension, unspecified: Secondary | ICD-10-CM | POA: Diagnosis not present

## 2021-02-11 DIAGNOSIS — J410 Simple chronic bronchitis: Secondary | ICD-10-CM | POA: Diagnosis not present

## 2021-02-11 DIAGNOSIS — E039 Hypothyroidism, unspecified: Secondary | ICD-10-CM | POA: Diagnosis not present

## 2021-02-11 DIAGNOSIS — M5136 Other intervertebral disc degeneration, lumbar region: Secondary | ICD-10-CM | POA: Diagnosis not present

## 2021-02-11 DIAGNOSIS — J9611 Chronic respiratory failure with hypoxia: Secondary | ICD-10-CM | POA: Diagnosis not present

## 2021-02-11 DIAGNOSIS — I5022 Chronic systolic (congestive) heart failure: Secondary | ICD-10-CM | POA: Diagnosis not present

## 2021-02-11 DIAGNOSIS — M9904 Segmental and somatic dysfunction of sacral region: Secondary | ICD-10-CM | POA: Diagnosis not present

## 2021-02-11 DIAGNOSIS — E0520 Thyrotoxicosis with toxic multinodular goiter without thyrotoxic crisis or storm: Secondary | ICD-10-CM | POA: Diagnosis not present

## 2021-02-11 DIAGNOSIS — M47812 Spondylosis without myelopathy or radiculopathy, cervical region: Secondary | ICD-10-CM | POA: Diagnosis not present

## 2021-02-11 DIAGNOSIS — M797 Fibromyalgia: Secondary | ICD-10-CM | POA: Diagnosis not present

## 2021-02-11 DIAGNOSIS — M800AXD Age-related osteoporosis with current pathological fracture, other site, subsequent encounter for fracture with routine healing: Secondary | ICD-10-CM | POA: Diagnosis not present

## 2021-02-11 DIAGNOSIS — M5481 Occipital neuralgia: Secondary | ICD-10-CM | POA: Diagnosis not present

## 2021-02-11 DIAGNOSIS — E871 Hypo-osmolality and hyponatremia: Secondary | ICD-10-CM | POA: Diagnosis not present

## 2021-02-11 DIAGNOSIS — M150 Primary generalized (osteo)arthritis: Secondary | ICD-10-CM | POA: Diagnosis not present

## 2021-02-11 DIAGNOSIS — E782 Mixed hyperlipidemia: Secondary | ICD-10-CM | POA: Diagnosis not present

## 2021-02-11 DIAGNOSIS — I6523 Occlusion and stenosis of bilateral carotid arteries: Secondary | ICD-10-CM | POA: Diagnosis not present

## 2021-02-11 DIAGNOSIS — J309 Allergic rhinitis, unspecified: Secondary | ICD-10-CM | POA: Diagnosis not present

## 2021-02-11 DIAGNOSIS — M4800 Spinal stenosis, site unspecified: Secondary | ICD-10-CM | POA: Diagnosis not present

## 2021-02-11 DIAGNOSIS — G9341 Metabolic encephalopathy: Secondary | ICD-10-CM | POA: Diagnosis not present

## 2021-02-11 DIAGNOSIS — E119 Type 2 diabetes mellitus without complications: Secondary | ICD-10-CM | POA: Diagnosis not present

## 2021-02-11 DIAGNOSIS — M47816 Spondylosis without myelopathy or radiculopathy, lumbar region: Secondary | ICD-10-CM | POA: Diagnosis not present

## 2021-02-11 DIAGNOSIS — I110 Hypertensive heart disease with heart failure: Secondary | ICD-10-CM | POA: Diagnosis not present

## 2021-02-11 DIAGNOSIS — G43909 Migraine, unspecified, not intractable, without status migrainosus: Secondary | ICD-10-CM | POA: Diagnosis not present

## 2021-02-11 DIAGNOSIS — J449 Chronic obstructive pulmonary disease, unspecified: Secondary | ICD-10-CM | POA: Diagnosis not present

## 2021-02-12 DIAGNOSIS — E871 Hypo-osmolality and hyponatremia: Secondary | ICD-10-CM | POA: Diagnosis not present

## 2021-02-12 DIAGNOSIS — M4800 Spinal stenosis, site unspecified: Secondary | ICD-10-CM | POA: Diagnosis not present

## 2021-02-12 DIAGNOSIS — M5136 Other intervertebral disc degeneration, lumbar region: Secondary | ICD-10-CM | POA: Diagnosis not present

## 2021-02-12 DIAGNOSIS — E0520 Thyrotoxicosis with toxic multinodular goiter without thyrotoxic crisis or storm: Secondary | ICD-10-CM | POA: Diagnosis not present

## 2021-02-12 DIAGNOSIS — J309 Allergic rhinitis, unspecified: Secondary | ICD-10-CM | POA: Diagnosis not present

## 2021-02-12 DIAGNOSIS — M9904 Segmental and somatic dysfunction of sacral region: Secondary | ICD-10-CM | POA: Diagnosis not present

## 2021-02-12 DIAGNOSIS — M47816 Spondylosis without myelopathy or radiculopathy, lumbar region: Secondary | ICD-10-CM | POA: Diagnosis not present

## 2021-02-12 DIAGNOSIS — M5481 Occipital neuralgia: Secondary | ICD-10-CM | POA: Diagnosis not present

## 2021-02-12 DIAGNOSIS — I110 Hypertensive heart disease with heart failure: Secondary | ICD-10-CM | POA: Diagnosis not present

## 2021-02-12 DIAGNOSIS — I5022 Chronic systolic (congestive) heart failure: Secondary | ICD-10-CM | POA: Diagnosis not present

## 2021-02-12 DIAGNOSIS — E039 Hypothyroidism, unspecified: Secondary | ICD-10-CM | POA: Diagnosis not present

## 2021-02-12 DIAGNOSIS — J449 Chronic obstructive pulmonary disease, unspecified: Secondary | ICD-10-CM | POA: Diagnosis not present

## 2021-02-12 DIAGNOSIS — E119 Type 2 diabetes mellitus without complications: Secondary | ICD-10-CM | POA: Diagnosis not present

## 2021-02-12 DIAGNOSIS — R296 Repeated falls: Secondary | ICD-10-CM | POA: Diagnosis not present

## 2021-02-12 DIAGNOSIS — J9611 Chronic respiratory failure with hypoxia: Secondary | ICD-10-CM | POA: Diagnosis not present

## 2021-02-12 DIAGNOSIS — M800AXD Age-related osteoporosis with current pathological fracture, other site, subsequent encounter for fracture with routine healing: Secondary | ICD-10-CM | POA: Diagnosis not present

## 2021-02-12 DIAGNOSIS — E782 Mixed hyperlipidemia: Secondary | ICD-10-CM | POA: Diagnosis not present

## 2021-02-12 DIAGNOSIS — J410 Simple chronic bronchitis: Secondary | ICD-10-CM | POA: Diagnosis not present

## 2021-02-12 DIAGNOSIS — M47812 Spondylosis without myelopathy or radiculopathy, cervical region: Secondary | ICD-10-CM | POA: Diagnosis not present

## 2021-02-12 DIAGNOSIS — I6523 Occlusion and stenosis of bilateral carotid arteries: Secondary | ICD-10-CM | POA: Diagnosis not present

## 2021-02-12 DIAGNOSIS — I272 Pulmonary hypertension, unspecified: Secondary | ICD-10-CM | POA: Diagnosis not present

## 2021-02-12 DIAGNOSIS — M150 Primary generalized (osteo)arthritis: Secondary | ICD-10-CM | POA: Diagnosis not present

## 2021-02-12 DIAGNOSIS — M1990 Unspecified osteoarthritis, unspecified site: Secondary | ICD-10-CM | POA: Diagnosis not present

## 2021-02-12 DIAGNOSIS — G43909 Migraine, unspecified, not intractable, without status migrainosus: Secondary | ICD-10-CM | POA: Diagnosis not present

## 2021-02-12 DIAGNOSIS — M797 Fibromyalgia: Secondary | ICD-10-CM | POA: Diagnosis not present

## 2021-02-12 DIAGNOSIS — G9341 Metabolic encephalopathy: Secondary | ICD-10-CM | POA: Diagnosis not present

## 2021-02-15 DIAGNOSIS — S82009D Unspecified fracture of unspecified patella, subsequent encounter for closed fracture with routine healing: Secondary | ICD-10-CM | POA: Diagnosis not present

## 2021-02-15 DIAGNOSIS — M25561 Pain in right knee: Secondary | ICD-10-CM | POA: Diagnosis not present

## 2021-02-15 DIAGNOSIS — S42402D Unspecified fracture of lower end of left humerus, subsequent encounter for fracture with routine healing: Secondary | ICD-10-CM | POA: Diagnosis not present

## 2021-02-18 DIAGNOSIS — M797 Fibromyalgia: Secondary | ICD-10-CM | POA: Diagnosis not present

## 2021-02-18 DIAGNOSIS — M9904 Segmental and somatic dysfunction of sacral region: Secondary | ICD-10-CM | POA: Diagnosis not present

## 2021-02-18 DIAGNOSIS — I6523 Occlusion and stenosis of bilateral carotid arteries: Secondary | ICD-10-CM | POA: Diagnosis not present

## 2021-02-18 DIAGNOSIS — I5022 Chronic systolic (congestive) heart failure: Secondary | ICD-10-CM | POA: Diagnosis not present

## 2021-02-18 DIAGNOSIS — M4800 Spinal stenosis, site unspecified: Secondary | ICD-10-CM | POA: Diagnosis not present

## 2021-02-18 DIAGNOSIS — J309 Allergic rhinitis, unspecified: Secondary | ICD-10-CM | POA: Diagnosis not present

## 2021-02-18 DIAGNOSIS — M150 Primary generalized (osteo)arthritis: Secondary | ICD-10-CM | POA: Diagnosis not present

## 2021-02-18 DIAGNOSIS — E871 Hypo-osmolality and hyponatremia: Secondary | ICD-10-CM | POA: Diagnosis not present

## 2021-02-18 DIAGNOSIS — M800AXD Age-related osteoporosis with current pathological fracture, other site, subsequent encounter for fracture with routine healing: Secondary | ICD-10-CM | POA: Diagnosis not present

## 2021-02-18 DIAGNOSIS — M5481 Occipital neuralgia: Secondary | ICD-10-CM | POA: Diagnosis not present

## 2021-02-18 DIAGNOSIS — M47816 Spondylosis without myelopathy or radiculopathy, lumbar region: Secondary | ICD-10-CM | POA: Diagnosis not present

## 2021-02-18 DIAGNOSIS — M5136 Other intervertebral disc degeneration, lumbar region: Secondary | ICD-10-CM | POA: Diagnosis not present

## 2021-02-18 DIAGNOSIS — I110 Hypertensive heart disease with heart failure: Secondary | ICD-10-CM | POA: Diagnosis not present

## 2021-02-18 DIAGNOSIS — I272 Pulmonary hypertension, unspecified: Secondary | ICD-10-CM | POA: Diagnosis not present

## 2021-02-18 DIAGNOSIS — E039 Hypothyroidism, unspecified: Secondary | ICD-10-CM | POA: Diagnosis not present

## 2021-02-18 DIAGNOSIS — G9341 Metabolic encephalopathy: Secondary | ICD-10-CM | POA: Diagnosis not present

## 2021-02-18 DIAGNOSIS — E119 Type 2 diabetes mellitus without complications: Secondary | ICD-10-CM | POA: Diagnosis not present

## 2021-02-18 DIAGNOSIS — M47812 Spondylosis without myelopathy or radiculopathy, cervical region: Secondary | ICD-10-CM | POA: Diagnosis not present

## 2021-02-18 DIAGNOSIS — J9611 Chronic respiratory failure with hypoxia: Secondary | ICD-10-CM | POA: Diagnosis not present

## 2021-02-18 DIAGNOSIS — E782 Mixed hyperlipidemia: Secondary | ICD-10-CM | POA: Diagnosis not present

## 2021-02-18 DIAGNOSIS — J410 Simple chronic bronchitis: Secondary | ICD-10-CM | POA: Diagnosis not present

## 2021-02-18 DIAGNOSIS — G43909 Migraine, unspecified, not intractable, without status migrainosus: Secondary | ICD-10-CM | POA: Diagnosis not present

## 2021-02-18 DIAGNOSIS — E0520 Thyrotoxicosis with toxic multinodular goiter without thyrotoxic crisis or storm: Secondary | ICD-10-CM | POA: Diagnosis not present

## 2021-02-18 DIAGNOSIS — J449 Chronic obstructive pulmonary disease, unspecified: Secondary | ICD-10-CM | POA: Diagnosis not present

## 2021-02-19 ENCOUNTER — Other Ambulatory Visit: Payer: Self-pay

## 2021-02-19 DIAGNOSIS — M5481 Occipital neuralgia: Secondary | ICD-10-CM | POA: Diagnosis not present

## 2021-02-19 DIAGNOSIS — J309 Allergic rhinitis, unspecified: Secondary | ICD-10-CM | POA: Diagnosis not present

## 2021-02-19 DIAGNOSIS — J449 Chronic obstructive pulmonary disease, unspecified: Secondary | ICD-10-CM | POA: Diagnosis not present

## 2021-02-19 DIAGNOSIS — I5022 Chronic systolic (congestive) heart failure: Secondary | ICD-10-CM | POA: Diagnosis not present

## 2021-02-19 DIAGNOSIS — M4800 Spinal stenosis, site unspecified: Secondary | ICD-10-CM | POA: Diagnosis not present

## 2021-02-19 DIAGNOSIS — E0520 Thyrotoxicosis with toxic multinodular goiter without thyrotoxic crisis or storm: Secondary | ICD-10-CM | POA: Diagnosis not present

## 2021-02-19 DIAGNOSIS — M9904 Segmental and somatic dysfunction of sacral region: Secondary | ICD-10-CM | POA: Diagnosis not present

## 2021-02-19 DIAGNOSIS — M800AXD Age-related osteoporosis with current pathological fracture, other site, subsequent encounter for fracture with routine healing: Secondary | ICD-10-CM | POA: Diagnosis not present

## 2021-02-19 DIAGNOSIS — I272 Pulmonary hypertension, unspecified: Secondary | ICD-10-CM | POA: Diagnosis not present

## 2021-02-19 DIAGNOSIS — J9611 Chronic respiratory failure with hypoxia: Secondary | ICD-10-CM | POA: Diagnosis not present

## 2021-02-19 DIAGNOSIS — M5136 Other intervertebral disc degeneration, lumbar region: Secondary | ICD-10-CM | POA: Diagnosis not present

## 2021-02-19 DIAGNOSIS — M47812 Spondylosis without myelopathy or radiculopathy, cervical region: Secondary | ICD-10-CM | POA: Diagnosis not present

## 2021-02-19 DIAGNOSIS — M150 Primary generalized (osteo)arthritis: Secondary | ICD-10-CM | POA: Diagnosis not present

## 2021-02-19 DIAGNOSIS — I6523 Occlusion and stenosis of bilateral carotid arteries: Secondary | ICD-10-CM | POA: Diagnosis not present

## 2021-02-19 DIAGNOSIS — G9341 Metabolic encephalopathy: Secondary | ICD-10-CM | POA: Diagnosis not present

## 2021-02-19 DIAGNOSIS — J410 Simple chronic bronchitis: Secondary | ICD-10-CM | POA: Diagnosis not present

## 2021-02-19 DIAGNOSIS — E782 Mixed hyperlipidemia: Secondary | ICD-10-CM | POA: Diagnosis not present

## 2021-02-19 DIAGNOSIS — M47816 Spondylosis without myelopathy or radiculopathy, lumbar region: Secondary | ICD-10-CM | POA: Diagnosis not present

## 2021-02-19 DIAGNOSIS — E871 Hypo-osmolality and hyponatremia: Secondary | ICD-10-CM | POA: Diagnosis not present

## 2021-02-19 DIAGNOSIS — G43909 Migraine, unspecified, not intractable, without status migrainosus: Secondary | ICD-10-CM | POA: Diagnosis not present

## 2021-02-19 DIAGNOSIS — E039 Hypothyroidism, unspecified: Secondary | ICD-10-CM | POA: Diagnosis not present

## 2021-02-19 DIAGNOSIS — M797 Fibromyalgia: Secondary | ICD-10-CM | POA: Diagnosis not present

## 2021-02-19 DIAGNOSIS — E119 Type 2 diabetes mellitus without complications: Secondary | ICD-10-CM | POA: Diagnosis not present

## 2021-02-19 DIAGNOSIS — I110 Hypertensive heart disease with heart failure: Secondary | ICD-10-CM | POA: Diagnosis not present

## 2021-02-19 MED ORDER — ONDANSETRON HCL 4 MG PO TABS: 4.0000 mg | ORAL_TABLET | Freq: Four times a day (QID) | ORAL | 3 refills | Status: DC | PRN

## 2021-02-19 MED ORDER — SYMBICORT 160-4.5 MCG/ACT IN AERO: 2.0000 | INHALATION_SPRAY | Freq: Two times a day (BID) | RESPIRATORY_TRACT | 3 refills | Status: DC

## 2021-02-20 ENCOUNTER — Telehealth: Payer: Self-pay

## 2021-02-20 DIAGNOSIS — E119 Type 2 diabetes mellitus without complications: Secondary | ICD-10-CM | POA: Diagnosis not present

## 2021-02-20 DIAGNOSIS — I6523 Occlusion and stenosis of bilateral carotid arteries: Secondary | ICD-10-CM | POA: Diagnosis not present

## 2021-02-20 DIAGNOSIS — J449 Chronic obstructive pulmonary disease, unspecified: Secondary | ICD-10-CM | POA: Diagnosis not present

## 2021-02-20 DIAGNOSIS — I272 Pulmonary hypertension, unspecified: Secondary | ICD-10-CM | POA: Diagnosis not present

## 2021-02-20 DIAGNOSIS — I110 Hypertensive heart disease with heart failure: Secondary | ICD-10-CM | POA: Diagnosis not present

## 2021-02-20 DIAGNOSIS — M4800 Spinal stenosis, site unspecified: Secondary | ICD-10-CM | POA: Diagnosis not present

## 2021-02-20 DIAGNOSIS — M9904 Segmental and somatic dysfunction of sacral region: Secondary | ICD-10-CM | POA: Diagnosis not present

## 2021-02-20 DIAGNOSIS — J9611 Chronic respiratory failure with hypoxia: Secondary | ICD-10-CM | POA: Diagnosis not present

## 2021-02-20 DIAGNOSIS — M47816 Spondylosis without myelopathy or radiculopathy, lumbar region: Secondary | ICD-10-CM | POA: Diagnosis not present

## 2021-02-20 DIAGNOSIS — J410 Simple chronic bronchitis: Secondary | ICD-10-CM | POA: Diagnosis not present

## 2021-02-20 DIAGNOSIS — M5136 Other intervertebral disc degeneration, lumbar region: Secondary | ICD-10-CM | POA: Diagnosis not present

## 2021-02-20 DIAGNOSIS — J309 Allergic rhinitis, unspecified: Secondary | ICD-10-CM | POA: Diagnosis not present

## 2021-02-20 DIAGNOSIS — E0520 Thyrotoxicosis with toxic multinodular goiter without thyrotoxic crisis or storm: Secondary | ICD-10-CM | POA: Diagnosis not present

## 2021-02-20 DIAGNOSIS — E871 Hypo-osmolality and hyponatremia: Secondary | ICD-10-CM | POA: Diagnosis not present

## 2021-02-20 DIAGNOSIS — M797 Fibromyalgia: Secondary | ICD-10-CM | POA: Diagnosis not present

## 2021-02-20 DIAGNOSIS — G9341 Metabolic encephalopathy: Secondary | ICD-10-CM | POA: Diagnosis not present

## 2021-02-20 DIAGNOSIS — M5481 Occipital neuralgia: Secondary | ICD-10-CM | POA: Diagnosis not present

## 2021-02-20 DIAGNOSIS — I5022 Chronic systolic (congestive) heart failure: Secondary | ICD-10-CM | POA: Diagnosis not present

## 2021-02-20 DIAGNOSIS — M800AXD Age-related osteoporosis with current pathological fracture, other site, subsequent encounter for fracture with routine healing: Secondary | ICD-10-CM | POA: Diagnosis not present

## 2021-02-20 DIAGNOSIS — E039 Hypothyroidism, unspecified: Secondary | ICD-10-CM | POA: Diagnosis not present

## 2021-02-20 DIAGNOSIS — G43909 Migraine, unspecified, not intractable, without status migrainosus: Secondary | ICD-10-CM | POA: Diagnosis not present

## 2021-02-20 DIAGNOSIS — M47812 Spondylosis without myelopathy or radiculopathy, cervical region: Secondary | ICD-10-CM | POA: Diagnosis not present

## 2021-02-20 DIAGNOSIS — M150 Primary generalized (osteo)arthritis: Secondary | ICD-10-CM | POA: Diagnosis not present

## 2021-02-20 DIAGNOSIS — E782 Mixed hyperlipidemia: Secondary | ICD-10-CM | POA: Diagnosis not present

## 2021-02-20 NOTE — Telephone Encounter (Signed)
Spoke with patient's care taker [name] and advised him that someone dropped off a blank FL2 form and it needed to  be filled out in order for [place] to sign it. He advised me he would be in on Monday to drop a completed one off.

## 2021-02-24 DIAGNOSIS — J449 Chronic obstructive pulmonary disease, unspecified: Secondary | ICD-10-CM | POA: Diagnosis not present

## 2021-02-25 ENCOUNTER — Other Ambulatory Visit: Payer: Self-pay

## 2021-02-25 ENCOUNTER — Telehealth: Payer: Self-pay

## 2021-02-25 DIAGNOSIS — M5136 Other intervertebral disc degeneration, lumbar region: Secondary | ICD-10-CM | POA: Diagnosis not present

## 2021-02-25 DIAGNOSIS — I6523 Occlusion and stenosis of bilateral carotid arteries: Secondary | ICD-10-CM | POA: Diagnosis not present

## 2021-02-25 DIAGNOSIS — E0520 Thyrotoxicosis with toxic multinodular goiter without thyrotoxic crisis or storm: Secondary | ICD-10-CM | POA: Diagnosis not present

## 2021-02-25 DIAGNOSIS — M4800 Spinal stenosis, site unspecified: Secondary | ICD-10-CM | POA: Diagnosis not present

## 2021-02-25 DIAGNOSIS — M5481 Occipital neuralgia: Secondary | ICD-10-CM | POA: Diagnosis not present

## 2021-02-25 DIAGNOSIS — M47816 Spondylosis without myelopathy or radiculopathy, lumbar region: Secondary | ICD-10-CM | POA: Diagnosis not present

## 2021-02-25 DIAGNOSIS — M47812 Spondylosis without myelopathy or radiculopathy, cervical region: Secondary | ICD-10-CM | POA: Diagnosis not present

## 2021-02-25 DIAGNOSIS — J309 Allergic rhinitis, unspecified: Secondary | ICD-10-CM | POA: Diagnosis not present

## 2021-02-25 DIAGNOSIS — E782 Mixed hyperlipidemia: Secondary | ICD-10-CM | POA: Diagnosis not present

## 2021-02-25 DIAGNOSIS — J449 Chronic obstructive pulmonary disease, unspecified: Secondary | ICD-10-CM | POA: Diagnosis not present

## 2021-02-25 DIAGNOSIS — G9341 Metabolic encephalopathy: Secondary | ICD-10-CM | POA: Diagnosis not present

## 2021-02-25 DIAGNOSIS — E039 Hypothyroidism, unspecified: Secondary | ICD-10-CM | POA: Diagnosis not present

## 2021-02-25 DIAGNOSIS — I110 Hypertensive heart disease with heart failure: Secondary | ICD-10-CM | POA: Diagnosis not present

## 2021-02-25 DIAGNOSIS — J9611 Chronic respiratory failure with hypoxia: Secondary | ICD-10-CM | POA: Diagnosis not present

## 2021-02-25 DIAGNOSIS — E871 Hypo-osmolality and hyponatremia: Secondary | ICD-10-CM | POA: Diagnosis not present

## 2021-02-25 DIAGNOSIS — E119 Type 2 diabetes mellitus without complications: Secondary | ICD-10-CM | POA: Diagnosis not present

## 2021-02-25 DIAGNOSIS — M797 Fibromyalgia: Secondary | ICD-10-CM | POA: Diagnosis not present

## 2021-02-25 DIAGNOSIS — J410 Simple chronic bronchitis: Secondary | ICD-10-CM | POA: Diagnosis not present

## 2021-02-25 DIAGNOSIS — G43909 Migraine, unspecified, not intractable, without status migrainosus: Secondary | ICD-10-CM | POA: Diagnosis not present

## 2021-02-25 DIAGNOSIS — I272 Pulmonary hypertension, unspecified: Secondary | ICD-10-CM | POA: Diagnosis not present

## 2021-02-25 DIAGNOSIS — I5022 Chronic systolic (congestive) heart failure: Secondary | ICD-10-CM | POA: Diagnosis not present

## 2021-02-25 DIAGNOSIS — M150 Primary generalized (osteo)arthritis: Secondary | ICD-10-CM | POA: Diagnosis not present

## 2021-02-25 DIAGNOSIS — M800AXD Age-related osteoporosis with current pathological fracture, other site, subsequent encounter for fracture with routine healing: Secondary | ICD-10-CM | POA: Diagnosis not present

## 2021-02-25 DIAGNOSIS — M9904 Segmental and somatic dysfunction of sacral region: Secondary | ICD-10-CM | POA: Diagnosis not present

## 2021-02-25 NOTE — Telephone Encounter (Signed)
FL2 form completed and signed by provider and care taker has been advised. Ready for pickup at the front desk. Copy placed in scan.

## 2021-02-26 DIAGNOSIS — I110 Hypertensive heart disease with heart failure: Secondary | ICD-10-CM | POA: Diagnosis not present

## 2021-02-26 DIAGNOSIS — M800AXD Age-related osteoporosis with current pathological fracture, other site, subsequent encounter for fracture with routine healing: Secondary | ICD-10-CM | POA: Diagnosis not present

## 2021-02-26 DIAGNOSIS — M5481 Occipital neuralgia: Secondary | ICD-10-CM | POA: Diagnosis not present

## 2021-02-26 DIAGNOSIS — J410 Simple chronic bronchitis: Secondary | ICD-10-CM | POA: Diagnosis not present

## 2021-02-26 DIAGNOSIS — I6523 Occlusion and stenosis of bilateral carotid arteries: Secondary | ICD-10-CM | POA: Diagnosis not present

## 2021-02-26 DIAGNOSIS — G43909 Migraine, unspecified, not intractable, without status migrainosus: Secondary | ICD-10-CM | POA: Diagnosis not present

## 2021-02-26 DIAGNOSIS — J309 Allergic rhinitis, unspecified: Secondary | ICD-10-CM | POA: Diagnosis not present

## 2021-02-26 DIAGNOSIS — E871 Hypo-osmolality and hyponatremia: Secondary | ICD-10-CM | POA: Diagnosis not present

## 2021-02-26 DIAGNOSIS — M47812 Spondylosis without myelopathy or radiculopathy, cervical region: Secondary | ICD-10-CM | POA: Diagnosis not present

## 2021-02-26 DIAGNOSIS — E0520 Thyrotoxicosis with toxic multinodular goiter without thyrotoxic crisis or storm: Secondary | ICD-10-CM | POA: Diagnosis not present

## 2021-02-26 DIAGNOSIS — M797 Fibromyalgia: Secondary | ICD-10-CM | POA: Diagnosis not present

## 2021-02-26 DIAGNOSIS — M47816 Spondylosis without myelopathy or radiculopathy, lumbar region: Secondary | ICD-10-CM | POA: Diagnosis not present

## 2021-02-26 DIAGNOSIS — E119 Type 2 diabetes mellitus without complications: Secondary | ICD-10-CM | POA: Diagnosis not present

## 2021-02-26 DIAGNOSIS — I272 Pulmonary hypertension, unspecified: Secondary | ICD-10-CM | POA: Diagnosis not present

## 2021-02-26 DIAGNOSIS — M5136 Other intervertebral disc degeneration, lumbar region: Secondary | ICD-10-CM | POA: Diagnosis not present

## 2021-02-26 DIAGNOSIS — J9611 Chronic respiratory failure with hypoxia: Secondary | ICD-10-CM | POA: Diagnosis not present

## 2021-02-26 DIAGNOSIS — J449 Chronic obstructive pulmonary disease, unspecified: Secondary | ICD-10-CM | POA: Diagnosis not present

## 2021-02-26 DIAGNOSIS — E782 Mixed hyperlipidemia: Secondary | ICD-10-CM | POA: Diagnosis not present

## 2021-02-26 DIAGNOSIS — E039 Hypothyroidism, unspecified: Secondary | ICD-10-CM | POA: Diagnosis not present

## 2021-02-26 DIAGNOSIS — G9341 Metabolic encephalopathy: Secondary | ICD-10-CM | POA: Diagnosis not present

## 2021-02-26 DIAGNOSIS — I5022 Chronic systolic (congestive) heart failure: Secondary | ICD-10-CM | POA: Diagnosis not present

## 2021-02-26 DIAGNOSIS — R0602 Shortness of breath: Secondary | ICD-10-CM | POA: Diagnosis not present

## 2021-02-26 DIAGNOSIS — M9904 Segmental and somatic dysfunction of sacral region: Secondary | ICD-10-CM | POA: Diagnosis not present

## 2021-02-26 DIAGNOSIS — M4800 Spinal stenosis, site unspecified: Secondary | ICD-10-CM | POA: Diagnosis not present

## 2021-02-26 DIAGNOSIS — M150 Primary generalized (osteo)arthritis: Secondary | ICD-10-CM | POA: Diagnosis not present

## 2021-02-28 ENCOUNTER — Other Ambulatory Visit: Payer: Self-pay | Admitting: Internal Medicine

## 2021-02-28 DIAGNOSIS — F3340 Major depressive disorder, recurrent, in remission, unspecified: Secondary | ICD-10-CM

## 2021-03-04 DIAGNOSIS — M47812 Spondylosis without myelopathy or radiculopathy, cervical region: Secondary | ICD-10-CM | POA: Diagnosis not present

## 2021-03-04 DIAGNOSIS — G43909 Migraine, unspecified, not intractable, without status migrainosus: Secondary | ICD-10-CM | POA: Diagnosis not present

## 2021-03-04 DIAGNOSIS — G9341 Metabolic encephalopathy: Secondary | ICD-10-CM | POA: Diagnosis not present

## 2021-03-04 DIAGNOSIS — M4800 Spinal stenosis, site unspecified: Secondary | ICD-10-CM | POA: Diagnosis not present

## 2021-03-04 DIAGNOSIS — M47816 Spondylosis without myelopathy or radiculopathy, lumbar region: Secondary | ICD-10-CM | POA: Diagnosis not present

## 2021-03-04 DIAGNOSIS — E782 Mixed hyperlipidemia: Secondary | ICD-10-CM | POA: Diagnosis not present

## 2021-03-04 DIAGNOSIS — M150 Primary generalized (osteo)arthritis: Secondary | ICD-10-CM | POA: Diagnosis not present

## 2021-03-04 DIAGNOSIS — M800AXD Age-related osteoporosis with current pathological fracture, other site, subsequent encounter for fracture with routine healing: Secondary | ICD-10-CM | POA: Diagnosis not present

## 2021-03-04 DIAGNOSIS — J309 Allergic rhinitis, unspecified: Secondary | ICD-10-CM | POA: Diagnosis not present

## 2021-03-04 DIAGNOSIS — J449 Chronic obstructive pulmonary disease, unspecified: Secondary | ICD-10-CM | POA: Diagnosis not present

## 2021-03-04 DIAGNOSIS — E0520 Thyrotoxicosis with toxic multinodular goiter without thyrotoxic crisis or storm: Secondary | ICD-10-CM | POA: Diagnosis not present

## 2021-03-04 DIAGNOSIS — I272 Pulmonary hypertension, unspecified: Secondary | ICD-10-CM | POA: Diagnosis not present

## 2021-03-04 DIAGNOSIS — E119 Type 2 diabetes mellitus without complications: Secondary | ICD-10-CM | POA: Diagnosis not present

## 2021-03-04 DIAGNOSIS — J410 Simple chronic bronchitis: Secondary | ICD-10-CM | POA: Diagnosis not present

## 2021-03-04 DIAGNOSIS — I5022 Chronic systolic (congestive) heart failure: Secondary | ICD-10-CM | POA: Diagnosis not present

## 2021-03-04 DIAGNOSIS — M5136 Other intervertebral disc degeneration, lumbar region: Secondary | ICD-10-CM | POA: Diagnosis not present

## 2021-03-04 DIAGNOSIS — J9611 Chronic respiratory failure with hypoxia: Secondary | ICD-10-CM | POA: Diagnosis not present

## 2021-03-04 DIAGNOSIS — M797 Fibromyalgia: Secondary | ICD-10-CM | POA: Diagnosis not present

## 2021-03-04 DIAGNOSIS — M9904 Segmental and somatic dysfunction of sacral region: Secondary | ICD-10-CM | POA: Diagnosis not present

## 2021-03-04 DIAGNOSIS — M5481 Occipital neuralgia: Secondary | ICD-10-CM | POA: Diagnosis not present

## 2021-03-04 DIAGNOSIS — E871 Hypo-osmolality and hyponatremia: Secondary | ICD-10-CM | POA: Diagnosis not present

## 2021-03-04 DIAGNOSIS — I6523 Occlusion and stenosis of bilateral carotid arteries: Secondary | ICD-10-CM | POA: Diagnosis not present

## 2021-03-04 DIAGNOSIS — E039 Hypothyroidism, unspecified: Secondary | ICD-10-CM | POA: Diagnosis not present

## 2021-03-04 DIAGNOSIS — I110 Hypertensive heart disease with heart failure: Secondary | ICD-10-CM | POA: Diagnosis not present

## 2021-03-05 ENCOUNTER — Other Ambulatory Visit: Payer: Self-pay

## 2021-03-05 DIAGNOSIS — F5101 Primary insomnia: Secondary | ICD-10-CM

## 2021-03-05 MED ORDER — FLUOXETINE HCL 40 MG PO CAPS: 40.0000 mg | ORAL_CAPSULE | Freq: Every day | ORAL | 1 refills | Status: DC | PRN

## 2021-03-05 MED ORDER — TIOTROPIUM BROMIDE MONOHYDRATE 18 MCG IN CAPS: 18.0000 ug | ORAL_CAPSULE | Freq: Two times a day (BID) | RESPIRATORY_TRACT | 1 refills | Status: DC

## 2021-03-07 DIAGNOSIS — G43909 Migraine, unspecified, not intractable, without status migrainosus: Secondary | ICD-10-CM | POA: Diagnosis not present

## 2021-03-07 DIAGNOSIS — E782 Mixed hyperlipidemia: Secondary | ICD-10-CM | POA: Diagnosis not present

## 2021-03-07 DIAGNOSIS — M5481 Occipital neuralgia: Secondary | ICD-10-CM | POA: Diagnosis not present

## 2021-03-07 DIAGNOSIS — M5136 Other intervertebral disc degeneration, lumbar region: Secondary | ICD-10-CM | POA: Diagnosis not present

## 2021-03-07 DIAGNOSIS — M797 Fibromyalgia: Secondary | ICD-10-CM | POA: Diagnosis not present

## 2021-03-07 DIAGNOSIS — M47812 Spondylosis without myelopathy or radiculopathy, cervical region: Secondary | ICD-10-CM | POA: Diagnosis not present

## 2021-03-07 DIAGNOSIS — I5022 Chronic systolic (congestive) heart failure: Secondary | ICD-10-CM | POA: Diagnosis not present

## 2021-03-07 DIAGNOSIS — J9611 Chronic respiratory failure with hypoxia: Secondary | ICD-10-CM | POA: Diagnosis not present

## 2021-03-07 DIAGNOSIS — J309 Allergic rhinitis, unspecified: Secondary | ICD-10-CM | POA: Diagnosis not present

## 2021-03-07 DIAGNOSIS — E871 Hypo-osmolality and hyponatremia: Secondary | ICD-10-CM | POA: Diagnosis not present

## 2021-03-07 DIAGNOSIS — M9904 Segmental and somatic dysfunction of sacral region: Secondary | ICD-10-CM | POA: Diagnosis not present

## 2021-03-07 DIAGNOSIS — E0520 Thyrotoxicosis with toxic multinodular goiter without thyrotoxic crisis or storm: Secondary | ICD-10-CM | POA: Diagnosis not present

## 2021-03-07 DIAGNOSIS — E119 Type 2 diabetes mellitus without complications: Secondary | ICD-10-CM | POA: Diagnosis not present

## 2021-03-07 DIAGNOSIS — I6523 Occlusion and stenosis of bilateral carotid arteries: Secondary | ICD-10-CM | POA: Diagnosis not present

## 2021-03-07 DIAGNOSIS — J410 Simple chronic bronchitis: Secondary | ICD-10-CM | POA: Diagnosis not present

## 2021-03-07 DIAGNOSIS — M47816 Spondylosis without myelopathy or radiculopathy, lumbar region: Secondary | ICD-10-CM | POA: Diagnosis not present

## 2021-03-07 DIAGNOSIS — J449 Chronic obstructive pulmonary disease, unspecified: Secondary | ICD-10-CM | POA: Diagnosis not present

## 2021-03-07 DIAGNOSIS — M800AXD Age-related osteoporosis with current pathological fracture, other site, subsequent encounter for fracture with routine healing: Secondary | ICD-10-CM | POA: Diagnosis not present

## 2021-03-07 DIAGNOSIS — M150 Primary generalized (osteo)arthritis: Secondary | ICD-10-CM | POA: Diagnosis not present

## 2021-03-07 DIAGNOSIS — M4800 Spinal stenosis, site unspecified: Secondary | ICD-10-CM | POA: Diagnosis not present

## 2021-03-07 DIAGNOSIS — I272 Pulmonary hypertension, unspecified: Secondary | ICD-10-CM | POA: Diagnosis not present

## 2021-03-07 DIAGNOSIS — I110 Hypertensive heart disease with heart failure: Secondary | ICD-10-CM | POA: Diagnosis not present

## 2021-03-07 DIAGNOSIS — E039 Hypothyroidism, unspecified: Secondary | ICD-10-CM | POA: Diagnosis not present

## 2021-03-07 DIAGNOSIS — G9341 Metabolic encephalopathy: Secondary | ICD-10-CM | POA: Diagnosis not present

## 2021-03-11 ENCOUNTER — Other Ambulatory Visit: Payer: Self-pay

## 2021-03-11 DIAGNOSIS — I272 Pulmonary hypertension, unspecified: Secondary | ICD-10-CM | POA: Diagnosis not present

## 2021-03-11 DIAGNOSIS — M47816 Spondylosis without myelopathy or radiculopathy, lumbar region: Secondary | ICD-10-CM | POA: Diagnosis not present

## 2021-03-11 DIAGNOSIS — M5136 Other intervertebral disc degeneration, lumbar region: Secondary | ICD-10-CM | POA: Diagnosis not present

## 2021-03-11 DIAGNOSIS — J9611 Chronic respiratory failure with hypoxia: Secondary | ICD-10-CM | POA: Diagnosis not present

## 2021-03-11 DIAGNOSIS — E039 Hypothyroidism, unspecified: Secondary | ICD-10-CM | POA: Diagnosis not present

## 2021-03-11 DIAGNOSIS — M47812 Spondylosis without myelopathy or radiculopathy, cervical region: Secondary | ICD-10-CM | POA: Diagnosis not present

## 2021-03-11 DIAGNOSIS — M9904 Segmental and somatic dysfunction of sacral region: Secondary | ICD-10-CM | POA: Diagnosis not present

## 2021-03-11 DIAGNOSIS — M5481 Occipital neuralgia: Secondary | ICD-10-CM | POA: Diagnosis not present

## 2021-03-11 DIAGNOSIS — I5022 Chronic systolic (congestive) heart failure: Secondary | ICD-10-CM | POA: Diagnosis not present

## 2021-03-11 DIAGNOSIS — J449 Chronic obstructive pulmonary disease, unspecified: Secondary | ICD-10-CM | POA: Diagnosis not present

## 2021-03-11 DIAGNOSIS — M797 Fibromyalgia: Secondary | ICD-10-CM | POA: Diagnosis not present

## 2021-03-11 DIAGNOSIS — I6523 Occlusion and stenosis of bilateral carotid arteries: Secondary | ICD-10-CM | POA: Diagnosis not present

## 2021-03-11 DIAGNOSIS — E782 Mixed hyperlipidemia: Secondary | ICD-10-CM | POA: Diagnosis not present

## 2021-03-11 DIAGNOSIS — I110 Hypertensive heart disease with heart failure: Secondary | ICD-10-CM | POA: Diagnosis not present

## 2021-03-11 DIAGNOSIS — M800AXD Age-related osteoporosis with current pathological fracture, other site, subsequent encounter for fracture with routine healing: Secondary | ICD-10-CM | POA: Diagnosis not present

## 2021-03-11 DIAGNOSIS — J410 Simple chronic bronchitis: Secondary | ICD-10-CM | POA: Diagnosis not present

## 2021-03-11 DIAGNOSIS — M150 Primary generalized (osteo)arthritis: Secondary | ICD-10-CM | POA: Diagnosis not present

## 2021-03-11 DIAGNOSIS — E871 Hypo-osmolality and hyponatremia: Secondary | ICD-10-CM | POA: Diagnosis not present

## 2021-03-11 DIAGNOSIS — G9341 Metabolic encephalopathy: Secondary | ICD-10-CM | POA: Diagnosis not present

## 2021-03-11 DIAGNOSIS — J309 Allergic rhinitis, unspecified: Secondary | ICD-10-CM | POA: Diagnosis not present

## 2021-03-11 DIAGNOSIS — E119 Type 2 diabetes mellitus without complications: Secondary | ICD-10-CM | POA: Diagnosis not present

## 2021-03-11 DIAGNOSIS — M4800 Spinal stenosis, site unspecified: Secondary | ICD-10-CM | POA: Diagnosis not present

## 2021-03-11 DIAGNOSIS — G43909 Migraine, unspecified, not intractable, without status migrainosus: Secondary | ICD-10-CM | POA: Diagnosis not present

## 2021-03-11 DIAGNOSIS — E0520 Thyrotoxicosis with toxic multinodular goiter without thyrotoxic crisis or storm: Secondary | ICD-10-CM | POA: Diagnosis not present

## 2021-03-11 MED ORDER — LORAZEPAM 1 MG PO TABS: 1.0000 mg | ORAL_TABLET | Freq: Two times a day (BID) | ORAL | 2 refills | Status: DC | PRN

## 2021-03-11 NOTE — Telephone Encounter (Signed)
Med sent to pharmacy.

## 2021-03-12 DIAGNOSIS — M5136 Other intervertebral disc degeneration, lumbar region: Secondary | ICD-10-CM | POA: Diagnosis not present

## 2021-03-12 DIAGNOSIS — M9904 Segmental and somatic dysfunction of sacral region: Secondary | ICD-10-CM | POA: Diagnosis not present

## 2021-03-12 DIAGNOSIS — E0520 Thyrotoxicosis with toxic multinodular goiter without thyrotoxic crisis or storm: Secondary | ICD-10-CM | POA: Diagnosis not present

## 2021-03-12 DIAGNOSIS — M800AXD Age-related osteoporosis with current pathological fracture, other site, subsequent encounter for fracture with routine healing: Secondary | ICD-10-CM | POA: Diagnosis not present

## 2021-03-12 DIAGNOSIS — E039 Hypothyroidism, unspecified: Secondary | ICD-10-CM | POA: Diagnosis not present

## 2021-03-12 DIAGNOSIS — G43909 Migraine, unspecified, not intractable, without status migrainosus: Secondary | ICD-10-CM | POA: Diagnosis not present

## 2021-03-12 DIAGNOSIS — M5481 Occipital neuralgia: Secondary | ICD-10-CM | POA: Diagnosis not present

## 2021-03-12 DIAGNOSIS — E871 Hypo-osmolality and hyponatremia: Secondary | ICD-10-CM | POA: Diagnosis not present

## 2021-03-12 DIAGNOSIS — E119 Type 2 diabetes mellitus without complications: Secondary | ICD-10-CM | POA: Diagnosis not present

## 2021-03-12 DIAGNOSIS — J9611 Chronic respiratory failure with hypoxia: Secondary | ICD-10-CM | POA: Diagnosis not present

## 2021-03-12 DIAGNOSIS — M47816 Spondylosis without myelopathy or radiculopathy, lumbar region: Secondary | ICD-10-CM | POA: Diagnosis not present

## 2021-03-12 DIAGNOSIS — I110 Hypertensive heart disease with heart failure: Secondary | ICD-10-CM | POA: Diagnosis not present

## 2021-03-12 DIAGNOSIS — E782 Mixed hyperlipidemia: Secondary | ICD-10-CM | POA: Diagnosis not present

## 2021-03-12 DIAGNOSIS — M150 Primary generalized (osteo)arthritis: Secondary | ICD-10-CM | POA: Diagnosis not present

## 2021-03-12 DIAGNOSIS — J410 Simple chronic bronchitis: Secondary | ICD-10-CM | POA: Diagnosis not present

## 2021-03-12 DIAGNOSIS — J449 Chronic obstructive pulmonary disease, unspecified: Secondary | ICD-10-CM | POA: Diagnosis not present

## 2021-03-12 DIAGNOSIS — M47812 Spondylosis without myelopathy or radiculopathy, cervical region: Secondary | ICD-10-CM | POA: Diagnosis not present

## 2021-03-12 DIAGNOSIS — J309 Allergic rhinitis, unspecified: Secondary | ICD-10-CM | POA: Diagnosis not present

## 2021-03-12 DIAGNOSIS — M797 Fibromyalgia: Secondary | ICD-10-CM | POA: Diagnosis not present

## 2021-03-12 DIAGNOSIS — I6523 Occlusion and stenosis of bilateral carotid arteries: Secondary | ICD-10-CM | POA: Diagnosis not present

## 2021-03-12 DIAGNOSIS — G9341 Metabolic encephalopathy: Secondary | ICD-10-CM | POA: Diagnosis not present

## 2021-03-12 DIAGNOSIS — I272 Pulmonary hypertension, unspecified: Secondary | ICD-10-CM | POA: Diagnosis not present

## 2021-03-12 DIAGNOSIS — M4800 Spinal stenosis, site unspecified: Secondary | ICD-10-CM | POA: Diagnosis not present

## 2021-03-12 DIAGNOSIS — I5022 Chronic systolic (congestive) heart failure: Secondary | ICD-10-CM | POA: Diagnosis not present

## 2021-03-14 DIAGNOSIS — M1990 Unspecified osteoarthritis, unspecified site: Secondary | ICD-10-CM | POA: Diagnosis not present

## 2021-03-14 DIAGNOSIS — J9611 Chronic respiratory failure with hypoxia: Secondary | ICD-10-CM | POA: Diagnosis not present

## 2021-03-14 DIAGNOSIS — R296 Repeated falls: Secondary | ICD-10-CM | POA: Diagnosis not present

## 2021-03-14 DIAGNOSIS — J449 Chronic obstructive pulmonary disease, unspecified: Secondary | ICD-10-CM | POA: Diagnosis not present

## 2021-03-17 DIAGNOSIS — J9611 Chronic respiratory failure with hypoxia: Secondary | ICD-10-CM | POA: Diagnosis not present

## 2021-03-17 DIAGNOSIS — M47812 Spondylosis without myelopathy or radiculopathy, cervical region: Secondary | ICD-10-CM | POA: Diagnosis not present

## 2021-03-17 DIAGNOSIS — I110 Hypertensive heart disease with heart failure: Secondary | ICD-10-CM | POA: Diagnosis not present

## 2021-03-17 DIAGNOSIS — J309 Allergic rhinitis, unspecified: Secondary | ICD-10-CM | POA: Diagnosis not present

## 2021-03-17 DIAGNOSIS — E119 Type 2 diabetes mellitus without complications: Secondary | ICD-10-CM | POA: Diagnosis not present

## 2021-03-17 DIAGNOSIS — M4800 Spinal stenosis, site unspecified: Secondary | ICD-10-CM | POA: Diagnosis not present

## 2021-03-17 DIAGNOSIS — M47816 Spondylosis without myelopathy or radiculopathy, lumbar region: Secondary | ICD-10-CM | POA: Diagnosis not present

## 2021-03-17 DIAGNOSIS — E0520 Thyrotoxicosis with toxic multinodular goiter without thyrotoxic crisis or storm: Secondary | ICD-10-CM | POA: Diagnosis not present

## 2021-03-17 DIAGNOSIS — J410 Simple chronic bronchitis: Secondary | ICD-10-CM | POA: Diagnosis not present

## 2021-03-17 DIAGNOSIS — M5481 Occipital neuralgia: Secondary | ICD-10-CM | POA: Diagnosis not present

## 2021-03-17 DIAGNOSIS — E039 Hypothyroidism, unspecified: Secondary | ICD-10-CM | POA: Diagnosis not present

## 2021-03-17 DIAGNOSIS — M9904 Segmental and somatic dysfunction of sacral region: Secondary | ICD-10-CM | POA: Diagnosis not present

## 2021-03-17 DIAGNOSIS — I272 Pulmonary hypertension, unspecified: Secondary | ICD-10-CM | POA: Diagnosis not present

## 2021-03-17 DIAGNOSIS — I6523 Occlusion and stenosis of bilateral carotid arteries: Secondary | ICD-10-CM | POA: Diagnosis not present

## 2021-03-17 DIAGNOSIS — G43909 Migraine, unspecified, not intractable, without status migrainosus: Secondary | ICD-10-CM | POA: Diagnosis not present

## 2021-03-17 DIAGNOSIS — M797 Fibromyalgia: Secondary | ICD-10-CM | POA: Diagnosis not present

## 2021-03-17 DIAGNOSIS — G9341 Metabolic encephalopathy: Secondary | ICD-10-CM | POA: Diagnosis not present

## 2021-03-17 DIAGNOSIS — J449 Chronic obstructive pulmonary disease, unspecified: Secondary | ICD-10-CM | POA: Diagnosis not present

## 2021-03-17 DIAGNOSIS — E871 Hypo-osmolality and hyponatremia: Secondary | ICD-10-CM | POA: Diagnosis not present

## 2021-03-17 DIAGNOSIS — M800AXD Age-related osteoporosis with current pathological fracture, other site, subsequent encounter for fracture with routine healing: Secondary | ICD-10-CM | POA: Diagnosis not present

## 2021-03-17 DIAGNOSIS — M150 Primary generalized (osteo)arthritis: Secondary | ICD-10-CM | POA: Diagnosis not present

## 2021-03-17 DIAGNOSIS — M5136 Other intervertebral disc degeneration, lumbar region: Secondary | ICD-10-CM | POA: Diagnosis not present

## 2021-03-17 DIAGNOSIS — I5022 Chronic systolic (congestive) heart failure: Secondary | ICD-10-CM | POA: Diagnosis not present

## 2021-03-17 DIAGNOSIS — E782 Mixed hyperlipidemia: Secondary | ICD-10-CM | POA: Diagnosis not present

## 2021-03-18 ENCOUNTER — Other Ambulatory Visit: Payer: Self-pay

## 2021-03-18 ENCOUNTER — Ambulatory Visit: Payer: Medicare Other | Admitting: Nurse Practitioner

## 2021-03-18 ENCOUNTER — Telehealth: Payer: Self-pay

## 2021-03-18 ENCOUNTER — Telehealth (INDEPENDENT_AMBULATORY_CARE_PROVIDER_SITE_OTHER): Payer: Medicare Other | Admitting: Nurse Practitioner

## 2021-03-18 ENCOUNTER — Encounter: Payer: Self-pay | Admitting: Nurse Practitioner

## 2021-03-18 VITALS — Ht 64.5 in | Wt 109.0 lb

## 2021-03-18 DIAGNOSIS — R0609 Other forms of dyspnea: Secondary | ICD-10-CM

## 2021-03-18 DIAGNOSIS — G43909 Migraine, unspecified, not intractable, without status migrainosus: Secondary | ICD-10-CM | POA: Diagnosis not present

## 2021-03-18 DIAGNOSIS — M797 Fibromyalgia: Secondary | ICD-10-CM | POA: Diagnosis not present

## 2021-03-18 DIAGNOSIS — J449 Chronic obstructive pulmonary disease, unspecified: Secondary | ICD-10-CM | POA: Diagnosis not present

## 2021-03-18 DIAGNOSIS — M4800 Spinal stenosis, site unspecified: Secondary | ICD-10-CM | POA: Diagnosis not present

## 2021-03-18 DIAGNOSIS — I5022 Chronic systolic (congestive) heart failure: Secondary | ICD-10-CM | POA: Diagnosis not present

## 2021-03-18 DIAGNOSIS — M150 Primary generalized (osteo)arthritis: Secondary | ICD-10-CM | POA: Diagnosis not present

## 2021-03-18 DIAGNOSIS — I2584 Coronary atherosclerosis due to calcified coronary lesion: Secondary | ICD-10-CM

## 2021-03-18 DIAGNOSIS — R42 Dizziness and giddiness: Secondary | ICD-10-CM | POA: Diagnosis not present

## 2021-03-18 DIAGNOSIS — M47812 Spondylosis without myelopathy or radiculopathy, cervical region: Secondary | ICD-10-CM | POA: Diagnosis not present

## 2021-03-18 DIAGNOSIS — M9904 Segmental and somatic dysfunction of sacral region: Secondary | ICD-10-CM | POA: Diagnosis not present

## 2021-03-18 DIAGNOSIS — J410 Simple chronic bronchitis: Secondary | ICD-10-CM | POA: Diagnosis not present

## 2021-03-18 DIAGNOSIS — F5101 Primary insomnia: Secondary | ICD-10-CM

## 2021-03-18 DIAGNOSIS — I272 Pulmonary hypertension, unspecified: Secondary | ICD-10-CM | POA: Diagnosis not present

## 2021-03-18 DIAGNOSIS — F419 Anxiety disorder, unspecified: Secondary | ICD-10-CM

## 2021-03-18 DIAGNOSIS — M5136 Other intervertebral disc degeneration, lumbar region: Secondary | ICD-10-CM | POA: Diagnosis not present

## 2021-03-18 DIAGNOSIS — E871 Hypo-osmolality and hyponatremia: Secondary | ICD-10-CM | POA: Diagnosis not present

## 2021-03-18 DIAGNOSIS — I110 Hypertensive heart disease with heart failure: Secondary | ICD-10-CM | POA: Diagnosis not present

## 2021-03-18 DIAGNOSIS — M5481 Occipital neuralgia: Secondary | ICD-10-CM | POA: Diagnosis not present

## 2021-03-18 DIAGNOSIS — J9611 Chronic respiratory failure with hypoxia: Secondary | ICD-10-CM | POA: Diagnosis not present

## 2021-03-18 DIAGNOSIS — E039 Hypothyroidism, unspecified: Secondary | ICD-10-CM | POA: Diagnosis not present

## 2021-03-18 DIAGNOSIS — E782 Mixed hyperlipidemia: Secondary | ICD-10-CM | POA: Diagnosis not present

## 2021-03-18 DIAGNOSIS — E119 Type 2 diabetes mellitus without complications: Secondary | ICD-10-CM | POA: Diagnosis not present

## 2021-03-18 DIAGNOSIS — Z0001 Encounter for general adult medical examination with abnormal findings: Secondary | ICD-10-CM

## 2021-03-18 DIAGNOSIS — I6523 Occlusion and stenosis of bilateral carotid arteries: Secondary | ICD-10-CM | POA: Diagnosis not present

## 2021-03-18 DIAGNOSIS — G9341 Metabolic encephalopathy: Secondary | ICD-10-CM | POA: Diagnosis not present

## 2021-03-18 DIAGNOSIS — E1169 Type 2 diabetes mellitus with other specified complication: Secondary | ICD-10-CM

## 2021-03-18 DIAGNOSIS — I2721 Secondary pulmonary arterial hypertension: Secondary | ICD-10-CM

## 2021-03-18 DIAGNOSIS — R300 Dysuria: Secondary | ICD-10-CM

## 2021-03-18 DIAGNOSIS — M800AXD Age-related osteoporosis with current pathological fracture, other site, subsequent encounter for fracture with routine healing: Secondary | ICD-10-CM | POA: Diagnosis not present

## 2021-03-18 DIAGNOSIS — R0902 Hypoxemia: Secondary | ICD-10-CM

## 2021-03-18 DIAGNOSIS — J309 Allergic rhinitis, unspecified: Secondary | ICD-10-CM | POA: Diagnosis not present

## 2021-03-18 DIAGNOSIS — I700 Atherosclerosis of aorta: Secondary | ICD-10-CM

## 2021-03-18 DIAGNOSIS — M47816 Spondylosis without myelopathy or radiculopathy, lumbar region: Secondary | ICD-10-CM | POA: Diagnosis not present

## 2021-03-18 DIAGNOSIS — E0520 Thyrotoxicosis with toxic multinodular goiter without thyrotoxic crisis or storm: Secondary | ICD-10-CM | POA: Diagnosis not present

## 2021-03-18 MED ORDER — MECLIZINE HCL 25 MG PO TABS: 25.0000 mg | ORAL_TABLET | Freq: Three times a day (TID) | ORAL | 0 refills | Status: DC | PRN

## 2021-03-18 MED ORDER — LORAZEPAM 1 MG PO TABS: 1.0000 mg | ORAL_TABLET | Freq: Two times a day (BID) | ORAL | 2 refills | Status: DC | PRN

## 2021-03-18 MED ORDER — LEVOTHYROXINE SODIUM 88 MCG PO TABS: 88.0000 ug | ORAL_TABLET | Freq: Every day | ORAL | 5 refills | Status: DC

## 2021-03-18 MED ORDER — ERGOCALCIFEROL 50 MCG (2000 UT) PO TABS: 2.0000 | ORAL_TABLET | Freq: Every day | ORAL | 2 refills | Status: DC

## 2021-03-18 MED ORDER — ALENDRONATE SODIUM 70 MG PO TABS: 70.0000 mg | ORAL_TABLET | ORAL | 3 refills | Status: DC

## 2021-03-18 MED ORDER — SYMBICORT 160-4.5 MCG/ACT IN AERO: 2.0000 | INHALATION_SPRAY | Freq: Two times a day (BID) | RESPIRATORY_TRACT | 3 refills | Status: DC

## 2021-03-18 MED ORDER — TRAMADOL HCL 50 MG PO TABS: 50.0000 mg | ORAL_TABLET | Freq: Two times a day (BID) | ORAL | 2 refills | Status: DC | PRN

## 2021-03-18 MED ORDER — FLUOXETINE HCL 40 MG PO CAPS: 40.0000 mg | ORAL_CAPSULE | Freq: Every day | ORAL | 1 refills | Status: DC | PRN

## 2021-03-18 MED ORDER — TIOTROPIUM BROMIDE MONOHYDRATE 18 MCG IN CAPS: 18.0000 ug | ORAL_CAPSULE | Freq: Two times a day (BID) | RESPIRATORY_TRACT | 1 refills | Status: DC

## 2021-03-18 MED ORDER — ALBUTEROL SULFATE HFA 108 (90 BASE) MCG/ACT IN AERS: 2.0000 | INHALATION_SPRAY | Freq: Four times a day (QID) | RESPIRATORY_TRACT | 1 refills | Status: DC | PRN

## 2021-03-18 MED ORDER — MIRTAZAPINE 7.5 MG PO TABS: 7.5000 mg | ORAL_TABLET | Freq: Every day | ORAL | 5 refills | Status: DC

## 2021-03-18 MED ORDER — FLUTICASONE PROPIONATE 50 MCG/ACT NA SUSP: 2.0000 | Freq: Two times a day (BID) | NASAL | 5 refills | Status: DC

## 2021-03-18 MED ORDER — LUBIPROSTONE 8 MCG PO CAPS: 8.0000 ug | ORAL_CAPSULE | Freq: Every day | ORAL | 2 refills | Status: DC

## 2021-03-18 MED ORDER — ONDANSETRON HCL 4 MG PO TABS: 4.0000 mg | ORAL_TABLET | Freq: Four times a day (QID) | ORAL | 3 refills | Status: DC | PRN

## 2021-03-18 MED ORDER — AZELASTINE HCL 137 MCG/SPRAY NA SOLN: 2.0000 | Freq: Two times a day (BID) | NASAL | 5 refills | Status: DC

## 2021-03-18 MED ORDER — IPRATROPIUM-ALBUTEROL 0.5-2.5 (3) MG/3ML IN SOLN: RESPIRATORY_TRACT | 1 refills | Status: DC

## 2021-03-18 MED ORDER — ROSUVASTATIN CALCIUM 20 MG PO TABS: 20.0000 mg | ORAL_TABLET | Freq: Every day | ORAL | 3 refills | Status: DC

## 2021-03-18 MED ORDER — VITAMIN B-12 1000 MCG/15ML PO LIQD: 1200.0000 ug | Freq: Every day | ORAL | 0 refills | Status: DC

## 2021-03-18 NOTE — Telephone Encounter (Signed)
Due to phar don't have pres for b12 liquid as per alyssa d/c that pres and advised pt take OTC vitamin B 12 500 mg # tab po daily

## 2021-03-18 NOTE — Telephone Encounter (Signed)
Called [facility] and d/c all her med and also lorazepam  and advised  them she used [facility]

## 2021-03-18 NOTE — Progress Notes (Signed)
[facility] [address] [place], [place] [address]  Internal MEDICINE  Telephone Visit  Patient Name: [name]  [id number]  [id number]  Date of Service: 03/18/2021  I connected with the patient at 2:35 PM by telephone and verified the patients identity using two identifiers.   I discussed the limitations, risks, security and privacy concerns of performing an evaluation and management service by telephone and the availability of in person appointments. I also discussed with the patient that there may be a patient responsible charge related to the service.  The patient expressed understanding and agrees to proceed.    Chief Complaint  Patient presents with   Telephone Screen   Telephone Assessment    [id number]   Dizziness   Nausea   Anxiety    HPI [name] presents for a telehealth virtual visit for Dizziness, nausea, and anxiety. She is also needing her medications to be switched to a different pharmacy so all of those prescriptions orders were sent in.    Current Medication: Outpatient Encounter Medications as of 03/18/2021  Medication Sig Note   acetaminophen (TYLENOL) 500 MG tablet Take 1,000 mg by mouth in the morning, at noon, and at bedtime.    aspirin EC 81 MG tablet Take 81 mg by mouth daily. Swallow whole.    hydrocortisone 2.5 % cream Place 1 application rectally 2 (two) times daily as needed (itching/hemorroids).     meclizine (ANTIVERT) 25 MG tablet Take 1 tablet (25 mg total) by mouth 3 (three) times daily as needed for dizziness or nausea.    Multiple Vitamin (MULTIVITAMIN WITH MINERALS) TABS tablet Take 1 tablet by mouth daily. Multivitamin for Seniors    [DISCONTINUED] albuterol (VENTOLIN HFA) 108 (90 Base) MCG/ACT inhaler Inhale 2 puffs into the lungs every 6 (six) hours as needed for wheezing or shortness of breath.    [DISCONTINUED] alendronate (FOSAMAX) 70 MG tablet Take 70 mg by mouth every Tuesday.     [DISCONTINUED] Azelastine HCl 137  MCG/SPRAY SOLN Place 2 sprays into both nostrils in the morning and at bedtime.     [DISCONTINUED] Cyanocobalamin (VITAMIN B-12) 1000 MCG/15ML LIQD Take 1,200 mcg by mouth daily.    [DISCONTINUED] Ergocalciferol 50 MCG (2000 UT) TABS Take 2 capsules by mouth daily.    [DISCONTINUED] FLUoxetine (PROZAC) 40 MG capsule Take 1 capsule (40 mg total) by mouth daily as needed.    [DISCONTINUED] fluticasone (FLONASE) 50 MCG/ACT nasal spray Place 2 sprays into both nostrils in the morning and at bedtime.     [DISCONTINUED] ipratropium-albuterol (DUONEB) 0.5-2.5 (3) MG/3ML SOLN One vial 3 x  aday for copd with hypoxia 05/22/2020: Patient states she takes twice daily   [DISCONTINUED] levothyroxine (SYNTHROID) 88 MCG tablet Take 88 mcg by mouth daily before breakfast. Take 30 minutes before food and with a full glass of water    [DISCONTINUED] LORazepam (ATIVAN) 1 MG tablet Take 1 tablet (1 mg total) by mouth 2 (two) times daily as needed for anxiety.    [DISCONTINUED] lubiprostone (AMITIZA) 8 MCG capsule Take 8 mcg by mouth daily with breakfast.    [DISCONTINUED] mirtazapine (REMERON) 7.5 MG tablet Take 7.5 mg by mouth at bedtime.    [DISCONTINUED] ondansetron (ZOFRAN) 4 MG tablet Take 1 tablet (4 mg total) by mouth every 6 (six) hours as needed for nausea.    [DISCONTINUED] rosuvastatin (CRESTOR) 20 MG tablet Take 1 tablet (20 mg total) by mouth daily.    [DISCONTINUED] SYMBICORT 160-4.5 MCG/ACT inhaler Inhale 2 puffs into  the lungs 2 (two) times daily.    [DISCONTINUED] tiotropium (SPIRIVA) 18 MCG inhalation capsule Place 1 capsule (18 mcg total) into inhaler and inhale in the morning and at bedtime.    [DISCONTINUED] traMADol (ULTRAM) 50 MG tablet Take 50 mg by mouth 2 (two) times daily as needed.    [DISCONTINUED] Vitamin D, Ergocalciferol, 50 MCG (2000 UT) CAPS Take by mouth.    albuterol (VENTOLIN HFA) 108 (90 Base) MCG/ACT inhaler Inhale 2 puffs into the lungs every 6 (six) hours as needed for wheezing or  shortness of breath.    alendronate (FOSAMAX) 70 MG tablet Take 1 tablet (70 mg total) by mouth every Tuesday.    Azelastine HCl 137 MCG/SPRAY SOLN Place 2 sprays into both nostrils in the morning and at bedtime.    Ergocalciferol 50 MCG (2000 UT) TABS Take 2 capsules by mouth daily.    FLUoxetine (PROZAC) 40 MG capsule Take 1 capsule (40 mg total) by mouth daily as needed.    fluticasone (FLONASE) 50 MCG/ACT nasal spray Place 2 sprays into both nostrils in the morning and at bedtime.    ipratropium-albuterol (DUONEB) 0.5-2.5 (3) MG/3ML SOLN One vial 3 x  aday for copd with hypoxia    levothyroxine (SYNTHROID) 88 MCG tablet Take 1 tablet (88 mcg total) by mouth daily before breakfast. Take 30 minutes before food and with a full glass of water    LORazepam (ATIVAN) 1 MG tablet Take 1 tablet (1 mg total) by mouth 2 (two) times daily as needed for anxiety.    lubiprostone (AMITIZA) 8 MCG capsule Take 1 capsule (8 mcg total) by mouth daily with breakfast.    mirtazapine (REMERON) 7.5 MG tablet Take 1 tablet (7.5 mg total) by mouth at bedtime.    ondansetron (ZOFRAN) 4 MG tablet Take 1 tablet (4 mg total) by mouth every 6 (six) hours as needed for nausea.    rosuvastatin (CRESTOR) 20 MG tablet Take 1 tablet (20 mg total) by mouth daily.    SYMBICORT 160-4.5 MCG/ACT inhaler Inhale 2 puffs into the lungs 2 (two) times daily.    traMADol (ULTRAM) 50 MG tablet Take 1 tablet (50 mg total) by mouth 2 (two) times daily as needed for moderate pain or severe pain.    [DISCONTINUED] Cyanocobalamin (VITAMIN B-12) 1000 MCG/15ML LIQD Take 18 mLs (1,200 mcg total) by mouth daily.    [DISCONTINUED] enoxaparin (LOVENOX) 40 MG/0.4ML injection Inject 0.4 mLs (40 mg total) into the skin daily. (Patient not taking: Reported on 12/14/2020)    [DISCONTINUED] tiotropium (SPIRIVA) 18 MCG inhalation capsule Place 1 capsule (18 mcg total) into inhaler and inhale in the morning and at bedtime.    No facility-administered  encounter medications on file as of 03/18/2021.    Surgical History: Past Surgical History:  Procedure Laterality Date   ABDOMINAL HYSTERECTOMY     CAST APPLICATION  11/19/2020   Procedure: Application of long-leg hinged brace right lower extremity.;  Surgeon: Christena Flake, MD;  Location: [facility] ORS;  Service: Orthopedics;;   ESOPHAGOGASTRODUODENOSCOPY (EGD) WITH PROPOFOL N/A 01/10/2015   Procedure: ESOPHAGOGASTRODUODENOSCOPY (EGD) WITH PROPOFOL;  Surgeon: Wallace Cullens, MD;  Location: [facility] ENDOSCOPY;  Service: Gastroenterology;  Laterality: N/A;   ORIF ELBOW FRACTURE Left 11/19/2020   Procedure: OPEN REDUCTION INTERNAL FIXATION (ORIF) ELBOW/OLECRANON FRACTURE;  Surgeon: Christena Flake, MD;  Location: [facility] ORS;  Service: Orthopedics;  Laterality: Left;   RECTAL SURGERY     RIGHT/LEFT HEART CATH AND CORONARY ANGIOGRAPHY Bilateral 04/08/2020  Procedure: RIGHT/LEFT HEART CATH AND CORONARY ANGIOGRAPHY;  Surgeon: Iran Ouch, MD;  Location: [facility] INVASIVE CV LAB;  Service: Cardiovascular;  Laterality: Bilateral;   SAVORY DILATION N/A 01/10/2015   Procedure: SAVORY DILATION;  Surgeon: Wallace Cullens, MD;  Location: [facility] ENDOSCOPY;  Service: Gastroenterology;  Laterality: N/A;   toenail removal Bilateral    great toes   TONSILLECTOMY AND ADENOIDECTOMY      Medical History: Past Medical History:  Diagnosis Date   Anxiety    Chest pain    CHF (congestive heart failure) (HCC)    COPD (chronic obstructive pulmonary disease) (HCC)    Depression    Diabetes mellitus without complication (HCC)    Fibromyalgia    Graves disease    Hypertension    IBS (irritable bowel syndrome)    PTSD (post-traumatic stress disorder)    Sinus problem    Spinal stenosis     Family History: Family History  Problem Relation Age of Onset   Alcohol abuse Mother    Depression Mother    Varicose Veins Mother    Alcohol abuse Father    Depression Father    Alcohol abuse Sister    Asthma Sister    COPD Sister     Depression Sister    Heart disease Sister    Hypertension Sister    Diabetes Sister    Depression Maternal Grandmother    Diabetes Paternal Grandmother    Stroke Paternal Grandmother     Social History   Socioeconomic History   Marital status: Widowed    Spouse name: Not on file   Number of children: 2   Years of education: college   Highest education level: Not on file  Occupational History   Not on file  Tobacco Use   Smoking status: Former    Packs/day: 0.50    Years: 25.00    Pack years: 12.50    Types: Cigarettes    Quit date: 06/03/2016    Years since quitting: 4.8   Smokeless tobacco: Never  Vaping Use   Vaping Use: Never used  Substance and Sexual Activity   Alcohol use: No    Alcohol/week: 0.0 standard drinks   Drug use: No   Sexual activity: Not Currently  Other Topics Concern   Not on file  Social History Narrative   Lives by herself; 1 daughter Marcelino Duster in [place]: 1 daughter in [place]    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   Social Determinants of Health   Financial Resource Strain: Not on file  Food Insecurity: No Food Insecurity   Worried About Programme researcher, broadcasting/film/video in the Last Year: Never true   Barista in the Last Year: Never true  Transportation Needs: No Transportation Needs   Lack of Transportation (Medical): No   Lack of Transportation (Non-Medical): No  Physical Activity: Not on file  Stress: No Stress Concern Present   Feeling of Stress : Only a little  Social Connections: Not on file  Intimate Partner Violence: Not At Risk   Fear of Current or Ex-Partner: No   Emotionally Abused: No   Physically Abused: No   Sexually Abused: No      Review of Systems  Constitutional:  Negative for chills, fatigue and unexpected weight change.  HENT:  Negative for congestion, rhinorrhea, sneezing and sore throat.   Eyes:  Negative for redness.  Respiratory:  Negative for cough, chest tightness and shortness of breath.    Cardiovascular:  Negative for chest pain and palpitations.  Gastrointestinal:  Positive for nausea. Negative for abdominal pain, constipation, diarrhea and vomiting.  Genitourinary:  Negative for dysuria and frequency.  Musculoskeletal:  Negative for arthralgias, back pain, joint swelling and neck pain.  Skin:  Negative for rash.  Neurological:  Positive for dizziness (vertigo) and light-headedness. Negative for tremors and numbness.  Hematological:  Negative for adenopathy. Does not bruise/bleed easily.  Psychiatric/Behavioral:  Negative for behavioral problems (Depression), sleep disturbance and suicidal ideas. The patient is not nervous/anxious.    Vital Signs: Ht 5' 4.5" (1.638 m)   Wt 109 lb (49.4 kg) Comment: 109lb  BMI 18.42 kg/m    Observation/Objective: She is alert and oriented and engages in conversation appropriately over the telephone. She does not sound as though she is in any acute distress.     Assessment/Plan: All medication prescriptions were sent to new pharmacy.  1. Dizziness Meclizine prescribed to relieve dizziness and vertigo.  - meclizine (ANTIVERT) 25 MG tablet; Take 1 tablet (25 mg total) by mouth 3 (three) times daily as needed for dizziness or nausea.  Dispense: 30 tablet; Refill: 0  2. Aortic atherosclerosis (HCC) Taking rosuvastatin.  - rosuvastatin (CRESTOR) 20 MG tablet; Take 1 tablet (20 mg total) by mouth daily.  Dispense: 90 tablet; Refill: 3  3. COPD with hypoxia (HCC) Stable, uses symbicort for maintenance inhaler. Has albuterol rescue inhaler and nebulizer treatments as needed.  - albuterol (VENTOLIN HFA) 108 (90 Base) MCG/ACT inhaler; Inhale 2 puffs into the lungs every 6 (six) hours as needed for wheezing or shortness of breath.  Dispense: 3 each; Refill: 1 - ipratropium-albuterol (DUONEB) 0.5-2.5 (3) MG/3ML SOLN; One vial 3 x  aday for copd with hypoxia  Dispense: 180 mL; Refill: 1  4. Primary insomnia Stable, takes ativan for sleep and  anxiety.  - LORazepam (ATIVAN) 1 MG tablet; Take 1 tablet (1 mg total) by mouth 2 (two) times daily as needed for anxiety.  Dispense: 60 tablet; Refill: 2  5. Anxiety Taking prozac and also takes ativan up to twice daily as needed for anxiety.  - FLUoxetine (PROZAC) 40 MG capsule; Take 1 capsule (40 mg total) by mouth daily as needed.  Dispense: 30 capsule; Refill: 1   General Counseling: [name] verbalizes understanding of the findings of today's phone visit and agrees with plan of treatment. I have discussed any further diagnostic evaluation that may be needed or ordered today. We also reviewed her medications today. she has been encouraged to call the office with any questions or concerns that should arise related to todays visit.  Return in about 3 months (around 06/18/2021) for F/U, in person, [name] PCP.   No orders of the defined types were placed in this encounter.   Meds ordered this encounter  Medications   FLUoxetine (PROZAC) 40 MG capsule    Sig: Take 1 capsule (40 mg total) by mouth daily as needed.    Dispense:  30 capsule    Refill:  1   ondansetron (ZOFRAN) 4 MG tablet    Sig: Take 1 tablet (4 mg total) by mouth every 6 (six) hours as needed for nausea.    Dispense:  30 tablet    Refill:  3   rosuvastatin (CRESTOR) 20 MG tablet    Sig: Take 1 tablet (20 mg total) by mouth daily.    Dispense:  90 tablet    Refill:  3   meclizine (ANTIVERT) 25 MG tablet    Sig: Take 1  tablet (25 mg total) by mouth 3 (three) times daily as needed for dizziness or nausea.    Dispense:  30 tablet    Refill:  0   SYMBICORT 160-4.5 MCG/ACT inhaler    Sig: Inhale 2 puffs into the lungs 2 (two) times daily.    Dispense:  1 each    Refill:  3   albuterol (VENTOLIN HFA) 108 (90 Base) MCG/ACT inhaler    Sig: Inhale 2 puffs into the lungs every 6 (six) hours as needed for wheezing or shortness of breath.    Dispense:  3 each    Refill:  1   DISCONTD: tiotropium (SPIRIVA) 18 MCG inhalation  capsule    Sig: Place 1 capsule (18 mcg total) into inhaler and inhale in the morning and at bedtime.    Dispense:  30 capsule    Refill:  1   Ergocalciferol 50 MCG (2000 UT) TABS    Sig: Take 2 capsules by mouth daily.    Dispense:  30 tablet    Refill:  2   DISCONTD: Cyanocobalamin (VITAMIN B-12) 1000 MCG/15ML LIQD    Sig: Take 18 mLs (1,200 mcg total) by mouth daily.    Dispense:  450 mL    Refill:  0   Azelastine HCl 137 MCG/SPRAY SOLN    Sig: Place 2 sprays into both nostrils in the morning and at bedtime.    Dispense:  30 mL    Refill:  5   fluticasone (FLONASE) 50 MCG/ACT nasal spray    Sig: Place 2 sprays into both nostrils in the morning and at bedtime.    Dispense:  16 g    Refill:  5   mirtazapine (REMERON) 7.5 MG tablet    Sig: Take 1 tablet (7.5 mg total) by mouth at bedtime.    Dispense:  30 tablet    Refill:  5   alendronate (FOSAMAX) 70 MG tablet    Sig: Take 1 tablet (70 mg total) by mouth every Tuesday.    Dispense:  15 tablet    Refill:  3   traMADol (ULTRAM) 50 MG tablet    Sig: Take 1 tablet (50 mg total) by mouth 2 (two) times daily as needed for moderate pain or severe pain.    Dispense:  60 tablet    Refill:  2   lubiprostone (AMITIZA) 8 MCG capsule    Sig: Take 1 capsule (8 mcg total) by mouth daily with breakfast.    Dispense:  30 capsule    Refill:  2   levothyroxine (SYNTHROID) 88 MCG tablet    Sig: Take 1 tablet (88 mcg total) by mouth daily before breakfast. Take 30 minutes before food and with a full glass of water    Dispense:  30 tablet    Refill:  5   ipratropium-albuterol (DUONEB) 0.5-2.5 (3) MG/3ML SOLN    Sig: One vial 3 x  aday for copd with hypoxia    Dispense:  180 mL    Refill:  1   LORazepam (ATIVAN) 1 MG tablet    Sig: Take 1 tablet (1 mg total) by mouth 2 (two) times daily as needed for anxiety.    Dispense:  60 tablet    Refill:  2    Note change in frequency, discontinue lorazepam daily at bedtime    Time spent:15  Minutes Time spent with patient included reviewing progress notes, labs, imaging studies, and discussing plan for follow up.  [place] Controlled Substance Database was  reviewed by me for overdose risk score (ORS) if appropriate.  This patient was seen by Sallyanne Kuster, FNP-C in collaboration with Dr. Beverely Risen as a part of collaborative care agreement.  Sanjiv Castorena R. Tedd Sias, MSN, FNP-C Internal medicine

## 2021-03-19 ENCOUNTER — Telehealth: Payer: Medicare Other | Admitting: Nurse Practitioner

## 2021-03-19 DIAGNOSIS — E782 Mixed hyperlipidemia: Secondary | ICD-10-CM | POA: Diagnosis not present

## 2021-03-19 DIAGNOSIS — M47812 Spondylosis without myelopathy or radiculopathy, cervical region: Secondary | ICD-10-CM | POA: Diagnosis not present

## 2021-03-19 DIAGNOSIS — M797 Fibromyalgia: Secondary | ICD-10-CM | POA: Diagnosis not present

## 2021-03-19 DIAGNOSIS — G43909 Migraine, unspecified, not intractable, without status migrainosus: Secondary | ICD-10-CM | POA: Diagnosis not present

## 2021-03-19 DIAGNOSIS — I6523 Occlusion and stenosis of bilateral carotid arteries: Secondary | ICD-10-CM | POA: Diagnosis not present

## 2021-03-19 DIAGNOSIS — E039 Hypothyroidism, unspecified: Secondary | ICD-10-CM | POA: Diagnosis not present

## 2021-03-19 DIAGNOSIS — M9904 Segmental and somatic dysfunction of sacral region: Secondary | ICD-10-CM | POA: Diagnosis not present

## 2021-03-19 DIAGNOSIS — E871 Hypo-osmolality and hyponatremia: Secondary | ICD-10-CM | POA: Diagnosis not present

## 2021-03-19 DIAGNOSIS — M150 Primary generalized (osteo)arthritis: Secondary | ICD-10-CM | POA: Diagnosis not present

## 2021-03-19 DIAGNOSIS — J410 Simple chronic bronchitis: Secondary | ICD-10-CM | POA: Diagnosis not present

## 2021-03-19 DIAGNOSIS — I272 Pulmonary hypertension, unspecified: Secondary | ICD-10-CM | POA: Diagnosis not present

## 2021-03-19 DIAGNOSIS — J309 Allergic rhinitis, unspecified: Secondary | ICD-10-CM | POA: Diagnosis not present

## 2021-03-19 DIAGNOSIS — G9341 Metabolic encephalopathy: Secondary | ICD-10-CM | POA: Diagnosis not present

## 2021-03-19 DIAGNOSIS — E0520 Thyrotoxicosis with toxic multinodular goiter without thyrotoxic crisis or storm: Secondary | ICD-10-CM | POA: Diagnosis not present

## 2021-03-19 DIAGNOSIS — J9611 Chronic respiratory failure with hypoxia: Secondary | ICD-10-CM | POA: Diagnosis not present

## 2021-03-19 DIAGNOSIS — I110 Hypertensive heart disease with heart failure: Secondary | ICD-10-CM | POA: Diagnosis not present

## 2021-03-19 DIAGNOSIS — M47816 Spondylosis without myelopathy or radiculopathy, lumbar region: Secondary | ICD-10-CM | POA: Diagnosis not present

## 2021-03-19 DIAGNOSIS — E119 Type 2 diabetes mellitus without complications: Secondary | ICD-10-CM | POA: Diagnosis not present

## 2021-03-19 DIAGNOSIS — M5481 Occipital neuralgia: Secondary | ICD-10-CM | POA: Diagnosis not present

## 2021-03-19 DIAGNOSIS — J449 Chronic obstructive pulmonary disease, unspecified: Secondary | ICD-10-CM | POA: Diagnosis not present

## 2021-03-19 DIAGNOSIS — M800AXD Age-related osteoporosis with current pathological fracture, other site, subsequent encounter for fracture with routine healing: Secondary | ICD-10-CM | POA: Diagnosis not present

## 2021-03-19 DIAGNOSIS — M4800 Spinal stenosis, site unspecified: Secondary | ICD-10-CM | POA: Diagnosis not present

## 2021-03-19 DIAGNOSIS — M5136 Other intervertebral disc degeneration, lumbar region: Secondary | ICD-10-CM | POA: Diagnosis not present

## 2021-03-19 DIAGNOSIS — I5022 Chronic systolic (congestive) heart failure: Secondary | ICD-10-CM | POA: Diagnosis not present

## 2021-03-21 ENCOUNTER — Telehealth: Payer: Self-pay

## 2021-03-21 NOTE — Telephone Encounter (Signed)
Detailed order for mobility device signed by provider and faxed to [facility] at [phone]. Order placed in scan.

## 2021-03-25 DIAGNOSIS — M5136 Other intervertebral disc degeneration, lumbar region: Secondary | ICD-10-CM | POA: Diagnosis not present

## 2021-03-25 DIAGNOSIS — M9904 Segmental and somatic dysfunction of sacral region: Secondary | ICD-10-CM | POA: Diagnosis not present

## 2021-03-25 DIAGNOSIS — J9611 Chronic respiratory failure with hypoxia: Secondary | ICD-10-CM | POA: Diagnosis not present

## 2021-03-25 DIAGNOSIS — I272 Pulmonary hypertension, unspecified: Secondary | ICD-10-CM | POA: Diagnosis not present

## 2021-03-25 DIAGNOSIS — I110 Hypertensive heart disease with heart failure: Secondary | ICD-10-CM | POA: Diagnosis not present

## 2021-03-25 DIAGNOSIS — G43909 Migraine, unspecified, not intractable, without status migrainosus: Secondary | ICD-10-CM | POA: Diagnosis not present

## 2021-03-25 DIAGNOSIS — E0520 Thyrotoxicosis with toxic multinodular goiter without thyrotoxic crisis or storm: Secondary | ICD-10-CM | POA: Diagnosis not present

## 2021-03-25 DIAGNOSIS — E119 Type 2 diabetes mellitus without complications: Secondary | ICD-10-CM | POA: Diagnosis not present

## 2021-03-25 DIAGNOSIS — M5481 Occipital neuralgia: Secondary | ICD-10-CM | POA: Diagnosis not present

## 2021-03-25 DIAGNOSIS — M47816 Spondylosis without myelopathy or radiculopathy, lumbar region: Secondary | ICD-10-CM | POA: Diagnosis not present

## 2021-03-25 DIAGNOSIS — M4800 Spinal stenosis, site unspecified: Secondary | ICD-10-CM | POA: Diagnosis not present

## 2021-03-25 DIAGNOSIS — G9341 Metabolic encephalopathy: Secondary | ICD-10-CM | POA: Diagnosis not present

## 2021-03-25 DIAGNOSIS — E039 Hypothyroidism, unspecified: Secondary | ICD-10-CM | POA: Diagnosis not present

## 2021-03-25 DIAGNOSIS — M797 Fibromyalgia: Secondary | ICD-10-CM | POA: Diagnosis not present

## 2021-03-25 DIAGNOSIS — J449 Chronic obstructive pulmonary disease, unspecified: Secondary | ICD-10-CM | POA: Diagnosis not present

## 2021-03-25 DIAGNOSIS — J410 Simple chronic bronchitis: Secondary | ICD-10-CM | POA: Diagnosis not present

## 2021-03-25 DIAGNOSIS — M800AXD Age-related osteoporosis with current pathological fracture, other site, subsequent encounter for fracture with routine healing: Secondary | ICD-10-CM | POA: Diagnosis not present

## 2021-03-25 DIAGNOSIS — E782 Mixed hyperlipidemia: Secondary | ICD-10-CM | POA: Diagnosis not present

## 2021-03-25 DIAGNOSIS — M150 Primary generalized (osteo)arthritis: Secondary | ICD-10-CM | POA: Diagnosis not present

## 2021-03-25 DIAGNOSIS — I5022 Chronic systolic (congestive) heart failure: Secondary | ICD-10-CM | POA: Diagnosis not present

## 2021-03-25 DIAGNOSIS — I6523 Occlusion and stenosis of bilateral carotid arteries: Secondary | ICD-10-CM | POA: Diagnosis not present

## 2021-03-25 DIAGNOSIS — E871 Hypo-osmolality and hyponatremia: Secondary | ICD-10-CM | POA: Diagnosis not present

## 2021-03-25 DIAGNOSIS — J309 Allergic rhinitis, unspecified: Secondary | ICD-10-CM | POA: Diagnosis not present

## 2021-03-25 DIAGNOSIS — M47812 Spondylosis without myelopathy or radiculopathy, cervical region: Secondary | ICD-10-CM | POA: Diagnosis not present

## 2021-03-27 DIAGNOSIS — J449 Chronic obstructive pulmonary disease, unspecified: Secondary | ICD-10-CM | POA: Diagnosis not present

## 2021-03-28 ENCOUNTER — Telehealth: Payer: Self-pay

## 2021-03-28 ENCOUNTER — Other Ambulatory Visit: Payer: Self-pay

## 2021-03-28 MED ORDER — SPIRIVA HANDIHALER 18 MCG IN CAPS: 18.0000 ug | ORAL_CAPSULE | Freq: Every day | RESPIRATORY_TRACT | 12 refills | Status: AC

## 2021-03-28 NOTE — Telephone Encounter (Signed)
Spoke to [employer] at [employer] and they advised that the [employer] is covered thru insurance for once a day, and per Alyssa we switched the dose from twice a day to once a day and pharmacy ran thru insurance and it was covered.  Pt was informed

## 2021-03-29 DIAGNOSIS — R0602 Shortness of breath: Secondary | ICD-10-CM | POA: Diagnosis not present

## 2021-04-01 DIAGNOSIS — I272 Pulmonary hypertension, unspecified: Secondary | ICD-10-CM | POA: Diagnosis not present

## 2021-04-01 DIAGNOSIS — I5022 Chronic systolic (congestive) heart failure: Secondary | ICD-10-CM | POA: Diagnosis not present

## 2021-04-01 DIAGNOSIS — E871 Hypo-osmolality and hyponatremia: Secondary | ICD-10-CM | POA: Diagnosis not present

## 2021-04-01 DIAGNOSIS — E119 Type 2 diabetes mellitus without complications: Secondary | ICD-10-CM | POA: Diagnosis not present

## 2021-04-01 DIAGNOSIS — G9341 Metabolic encephalopathy: Secondary | ICD-10-CM | POA: Diagnosis not present

## 2021-04-01 DIAGNOSIS — J309 Allergic rhinitis, unspecified: Secondary | ICD-10-CM | POA: Diagnosis not present

## 2021-04-01 DIAGNOSIS — J9611 Chronic respiratory failure with hypoxia: Secondary | ICD-10-CM | POA: Diagnosis not present

## 2021-04-01 DIAGNOSIS — M5136 Other intervertebral disc degeneration, lumbar region: Secondary | ICD-10-CM | POA: Diagnosis not present

## 2021-04-01 DIAGNOSIS — M4800 Spinal stenosis, site unspecified: Secondary | ICD-10-CM | POA: Diagnosis not present

## 2021-04-01 DIAGNOSIS — M797 Fibromyalgia: Secondary | ICD-10-CM | POA: Diagnosis not present

## 2021-04-01 DIAGNOSIS — G43909 Migraine, unspecified, not intractable, without status migrainosus: Secondary | ICD-10-CM | POA: Diagnosis not present

## 2021-04-01 DIAGNOSIS — I6523 Occlusion and stenosis of bilateral carotid arteries: Secondary | ICD-10-CM | POA: Diagnosis not present

## 2021-04-01 DIAGNOSIS — M5481 Occipital neuralgia: Secondary | ICD-10-CM | POA: Diagnosis not present

## 2021-04-01 DIAGNOSIS — E0520 Thyrotoxicosis with toxic multinodular goiter without thyrotoxic crisis or storm: Secondary | ICD-10-CM | POA: Diagnosis not present

## 2021-04-01 DIAGNOSIS — E039 Hypothyroidism, unspecified: Secondary | ICD-10-CM | POA: Diagnosis not present

## 2021-04-01 DIAGNOSIS — M9904 Segmental and somatic dysfunction of sacral region: Secondary | ICD-10-CM | POA: Diagnosis not present

## 2021-04-01 DIAGNOSIS — J410 Simple chronic bronchitis: Secondary | ICD-10-CM | POA: Diagnosis not present

## 2021-04-01 DIAGNOSIS — J449 Chronic obstructive pulmonary disease, unspecified: Secondary | ICD-10-CM | POA: Diagnosis not present

## 2021-04-01 DIAGNOSIS — M47812 Spondylosis without myelopathy or radiculopathy, cervical region: Secondary | ICD-10-CM | POA: Diagnosis not present

## 2021-04-01 DIAGNOSIS — M47816 Spondylosis without myelopathy or radiculopathy, lumbar region: Secondary | ICD-10-CM | POA: Diagnosis not present

## 2021-04-01 DIAGNOSIS — M800AXD Age-related osteoporosis with current pathological fracture, other site, subsequent encounter for fracture with routine healing: Secondary | ICD-10-CM | POA: Diagnosis not present

## 2021-04-01 DIAGNOSIS — I110 Hypertensive heart disease with heart failure: Secondary | ICD-10-CM | POA: Diagnosis not present

## 2021-04-01 DIAGNOSIS — M150 Primary generalized (osteo)arthritis: Secondary | ICD-10-CM | POA: Diagnosis not present

## 2021-04-01 DIAGNOSIS — E782 Mixed hyperlipidemia: Secondary | ICD-10-CM | POA: Diagnosis not present

## 2021-04-02 ENCOUNTER — Telehealth: Payer: Self-pay

## 2021-04-02 NOTE — Telephone Encounter (Signed)
[facility] called that pt requesting portable oxygen she said we can fax to [facility] [phone] and phone [phone] she will  advised that let pt know she need to been seen for oxygen order

## 2021-04-08 DIAGNOSIS — I272 Pulmonary hypertension, unspecified: Secondary | ICD-10-CM | POA: Diagnosis not present

## 2021-04-08 DIAGNOSIS — I5022 Chronic systolic (congestive) heart failure: Secondary | ICD-10-CM | POA: Diagnosis not present

## 2021-04-08 DIAGNOSIS — M797 Fibromyalgia: Secondary | ICD-10-CM | POA: Diagnosis not present

## 2021-04-08 DIAGNOSIS — J410 Simple chronic bronchitis: Secondary | ICD-10-CM | POA: Diagnosis not present

## 2021-04-08 DIAGNOSIS — M5136 Other intervertebral disc degeneration, lumbar region: Secondary | ICD-10-CM | POA: Diagnosis not present

## 2021-04-08 DIAGNOSIS — I6523 Occlusion and stenosis of bilateral carotid arteries: Secondary | ICD-10-CM | POA: Diagnosis not present

## 2021-04-08 DIAGNOSIS — M4800 Spinal stenosis, site unspecified: Secondary | ICD-10-CM | POA: Diagnosis not present

## 2021-04-08 DIAGNOSIS — E782 Mixed hyperlipidemia: Secondary | ICD-10-CM | POA: Diagnosis not present

## 2021-04-08 DIAGNOSIS — E039 Hypothyroidism, unspecified: Secondary | ICD-10-CM | POA: Diagnosis not present

## 2021-04-08 DIAGNOSIS — J309 Allergic rhinitis, unspecified: Secondary | ICD-10-CM | POA: Diagnosis not present

## 2021-04-08 DIAGNOSIS — E0520 Thyrotoxicosis with toxic multinodular goiter without thyrotoxic crisis or storm: Secondary | ICD-10-CM | POA: Diagnosis not present

## 2021-04-08 DIAGNOSIS — G43909 Migraine, unspecified, not intractable, without status migrainosus: Secondary | ICD-10-CM | POA: Diagnosis not present

## 2021-04-08 DIAGNOSIS — M47812 Spondylosis without myelopathy or radiculopathy, cervical region: Secondary | ICD-10-CM | POA: Diagnosis not present

## 2021-04-08 DIAGNOSIS — E871 Hypo-osmolality and hyponatremia: Secondary | ICD-10-CM | POA: Diagnosis not present

## 2021-04-08 DIAGNOSIS — G9341 Metabolic encephalopathy: Secondary | ICD-10-CM | POA: Diagnosis not present

## 2021-04-08 DIAGNOSIS — M9904 Segmental and somatic dysfunction of sacral region: Secondary | ICD-10-CM | POA: Diagnosis not present

## 2021-04-08 DIAGNOSIS — M47816 Spondylosis without myelopathy or radiculopathy, lumbar region: Secondary | ICD-10-CM | POA: Diagnosis not present

## 2021-04-08 DIAGNOSIS — M150 Primary generalized (osteo)arthritis: Secondary | ICD-10-CM | POA: Diagnosis not present

## 2021-04-08 DIAGNOSIS — M5481 Occipital neuralgia: Secondary | ICD-10-CM | POA: Diagnosis not present

## 2021-04-08 DIAGNOSIS — I110 Hypertensive heart disease with heart failure: Secondary | ICD-10-CM | POA: Diagnosis not present

## 2021-04-08 DIAGNOSIS — J9611 Chronic respiratory failure with hypoxia: Secondary | ICD-10-CM | POA: Diagnosis not present

## 2021-04-08 DIAGNOSIS — M800AXD Age-related osteoporosis with current pathological fracture, other site, subsequent encounter for fracture with routine healing: Secondary | ICD-10-CM | POA: Diagnosis not present

## 2021-04-08 DIAGNOSIS — E119 Type 2 diabetes mellitus without complications: Secondary | ICD-10-CM | POA: Diagnosis not present

## 2021-04-08 DIAGNOSIS — J449 Chronic obstructive pulmonary disease, unspecified: Secondary | ICD-10-CM | POA: Diagnosis not present

## 2021-04-11 DIAGNOSIS — S42402D Unspecified fracture of lower end of left humerus, subsequent encounter for fracture with routine healing: Secondary | ICD-10-CM | POA: Diagnosis not present

## 2021-04-11 DIAGNOSIS — M25561 Pain in right knee: Secondary | ICD-10-CM | POA: Diagnosis not present

## 2021-04-11 DIAGNOSIS — S82009D Unspecified fracture of unspecified patella, subsequent encounter for closed fracture with routine healing: Secondary | ICD-10-CM | POA: Diagnosis not present

## 2021-04-14 DIAGNOSIS — J449 Chronic obstructive pulmonary disease, unspecified: Secondary | ICD-10-CM | POA: Diagnosis not present

## 2021-04-14 DIAGNOSIS — J9611 Chronic respiratory failure with hypoxia: Secondary | ICD-10-CM | POA: Diagnosis not present

## 2021-04-14 DIAGNOSIS — M1990 Unspecified osteoarthritis, unspecified site: Secondary | ICD-10-CM | POA: Diagnosis not present

## 2021-04-14 DIAGNOSIS — R296 Repeated falls: Secondary | ICD-10-CM | POA: Diagnosis not present

## 2021-04-15 DIAGNOSIS — J410 Simple chronic bronchitis: Secondary | ICD-10-CM | POA: Diagnosis not present

## 2021-04-15 DIAGNOSIS — E039 Hypothyroidism, unspecified: Secondary | ICD-10-CM | POA: Diagnosis not present

## 2021-04-15 DIAGNOSIS — M9904 Segmental and somatic dysfunction of sacral region: Secondary | ICD-10-CM | POA: Diagnosis not present

## 2021-04-15 DIAGNOSIS — G9341 Metabolic encephalopathy: Secondary | ICD-10-CM | POA: Diagnosis not present

## 2021-04-15 DIAGNOSIS — I110 Hypertensive heart disease with heart failure: Secondary | ICD-10-CM | POA: Diagnosis not present

## 2021-04-15 DIAGNOSIS — I272 Pulmonary hypertension, unspecified: Secondary | ICD-10-CM | POA: Diagnosis not present

## 2021-04-15 DIAGNOSIS — J449 Chronic obstructive pulmonary disease, unspecified: Secondary | ICD-10-CM | POA: Diagnosis not present

## 2021-04-15 DIAGNOSIS — I5022 Chronic systolic (congestive) heart failure: Secondary | ICD-10-CM | POA: Diagnosis not present

## 2021-04-15 DIAGNOSIS — E119 Type 2 diabetes mellitus without complications: Secondary | ICD-10-CM | POA: Diagnosis not present

## 2021-04-15 DIAGNOSIS — M5136 Other intervertebral disc degeneration, lumbar region: Secondary | ICD-10-CM | POA: Diagnosis not present

## 2021-04-15 DIAGNOSIS — M4800 Spinal stenosis, site unspecified: Secondary | ICD-10-CM | POA: Diagnosis not present

## 2021-04-15 DIAGNOSIS — M47816 Spondylosis without myelopathy or radiculopathy, lumbar region: Secondary | ICD-10-CM | POA: Diagnosis not present

## 2021-04-15 DIAGNOSIS — M797 Fibromyalgia: Secondary | ICD-10-CM | POA: Diagnosis not present

## 2021-04-15 DIAGNOSIS — G43909 Migraine, unspecified, not intractable, without status migrainosus: Secondary | ICD-10-CM | POA: Diagnosis not present

## 2021-04-15 DIAGNOSIS — E782 Mixed hyperlipidemia: Secondary | ICD-10-CM | POA: Diagnosis not present

## 2021-04-15 DIAGNOSIS — E871 Hypo-osmolality and hyponatremia: Secondary | ICD-10-CM | POA: Diagnosis not present

## 2021-04-15 DIAGNOSIS — J9611 Chronic respiratory failure with hypoxia: Secondary | ICD-10-CM | POA: Diagnosis not present

## 2021-04-15 DIAGNOSIS — M47812 Spondylosis without myelopathy or radiculopathy, cervical region: Secondary | ICD-10-CM | POA: Diagnosis not present

## 2021-04-15 DIAGNOSIS — I6523 Occlusion and stenosis of bilateral carotid arteries: Secondary | ICD-10-CM | POA: Diagnosis not present

## 2021-04-15 DIAGNOSIS — M150 Primary generalized (osteo)arthritis: Secondary | ICD-10-CM | POA: Diagnosis not present

## 2021-04-15 DIAGNOSIS — J309 Allergic rhinitis, unspecified: Secondary | ICD-10-CM | POA: Diagnosis not present

## 2021-04-15 DIAGNOSIS — M5481 Occipital neuralgia: Secondary | ICD-10-CM | POA: Diagnosis not present

## 2021-04-15 DIAGNOSIS — M800AXD Age-related osteoporosis with current pathological fracture, other site, subsequent encounter for fracture with routine healing: Secondary | ICD-10-CM | POA: Diagnosis not present

## 2021-04-15 DIAGNOSIS — E0520 Thyrotoxicosis with toxic multinodular goiter without thyrotoxic crisis or storm: Secondary | ICD-10-CM | POA: Diagnosis not present

## 2021-04-22 ENCOUNTER — Other Ambulatory Visit: Payer: Self-pay

## 2021-04-22 ENCOUNTER — Telehealth: Payer: Self-pay

## 2021-04-22 MED ORDER — MONTELUKAST SODIUM 10 MG PO TABS: 10.0000 mg | ORAL_TABLET | Freq: Every day | ORAL | 1 refills | Status: DC

## 2021-04-22 NOTE — Telephone Encounter (Signed)
[name] from [facility] call me back and advised she spoke to the [employer] and advised that they have tried to contact pt in the past to bring her supplies and they would call pt to confirm appt to deliver and then the patient would not answer the phone, so [facility] wouldn't deliver the supplies.  I informed pt of them trying to get in touch with her in the past and I advised pt that in the future she needs to answer the phone so in case she is having supplies delivered they will know she is home.  I also advised for pt to contact [employer] to get form for PCP to fill out for pt being on oxygen if power goes out.  Advised for pt to call [facility] if her reserve oxygen is almost out that she will need a refill

## 2021-04-22 NOTE — Telephone Encounter (Signed)
Spoke to pt and she was c/o having a runny nose with her nasal cannula (oxygen), I spoke to Alyssa and she sent in Singulair to the pharmacy for pt.  Pt also was stating that she was upset with [facility] and wants to change companies she advised that they dont come like they should.  I called [facility] and asked for Beth to check when pt is to get her oxygen and pt is to call [facility] when she is running low on tanks and they will get the oxygen out to the pt.  I asked Beth if someone could call pt and see what they can do to make sure the pt is happy and Waynetta Sandy stated she will get someone and see what they need to do for the pt.  Pt is requesting a appointment so she can get a prescription for a portable O2, was going to switch pt to appt desk but she advised she was tired and had to go and will call back later and hung up on me

## 2021-04-23 DIAGNOSIS — M150 Primary generalized (osteo)arthritis: Secondary | ICD-10-CM | POA: Diagnosis not present

## 2021-04-23 DIAGNOSIS — I110 Hypertensive heart disease with heart failure: Secondary | ICD-10-CM | POA: Diagnosis not present

## 2021-04-23 DIAGNOSIS — M797 Fibromyalgia: Secondary | ICD-10-CM | POA: Diagnosis not present

## 2021-04-23 DIAGNOSIS — I5022 Chronic systolic (congestive) heart failure: Secondary | ICD-10-CM | POA: Diagnosis not present

## 2021-04-23 DIAGNOSIS — M800AXD Age-related osteoporosis with current pathological fracture, other site, subsequent encounter for fracture with routine healing: Secondary | ICD-10-CM | POA: Diagnosis not present

## 2021-04-23 DIAGNOSIS — M5481 Occipital neuralgia: Secondary | ICD-10-CM | POA: Diagnosis not present

## 2021-04-23 DIAGNOSIS — E039 Hypothyroidism, unspecified: Secondary | ICD-10-CM | POA: Diagnosis not present

## 2021-04-23 DIAGNOSIS — J9611 Chronic respiratory failure with hypoxia: Secondary | ICD-10-CM | POA: Diagnosis not present

## 2021-04-23 DIAGNOSIS — M47816 Spondylosis without myelopathy or radiculopathy, lumbar region: Secondary | ICD-10-CM | POA: Diagnosis not present

## 2021-04-23 DIAGNOSIS — M4800 Spinal stenosis, site unspecified: Secondary | ICD-10-CM | POA: Diagnosis not present

## 2021-04-23 DIAGNOSIS — E782 Mixed hyperlipidemia: Secondary | ICD-10-CM | POA: Diagnosis not present

## 2021-04-23 DIAGNOSIS — G43909 Migraine, unspecified, not intractable, without status migrainosus: Secondary | ICD-10-CM | POA: Diagnosis not present

## 2021-04-23 DIAGNOSIS — E871 Hypo-osmolality and hyponatremia: Secondary | ICD-10-CM | POA: Diagnosis not present

## 2021-04-23 DIAGNOSIS — J449 Chronic obstructive pulmonary disease, unspecified: Secondary | ICD-10-CM | POA: Diagnosis not present

## 2021-04-23 DIAGNOSIS — I272 Pulmonary hypertension, unspecified: Secondary | ICD-10-CM | POA: Diagnosis not present

## 2021-04-23 DIAGNOSIS — E0520 Thyrotoxicosis with toxic multinodular goiter without thyrotoxic crisis or storm: Secondary | ICD-10-CM | POA: Diagnosis not present

## 2021-04-23 DIAGNOSIS — J410 Simple chronic bronchitis: Secondary | ICD-10-CM | POA: Diagnosis not present

## 2021-04-23 DIAGNOSIS — J309 Allergic rhinitis, unspecified: Secondary | ICD-10-CM | POA: Diagnosis not present

## 2021-04-23 DIAGNOSIS — M5136 Other intervertebral disc degeneration, lumbar region: Secondary | ICD-10-CM | POA: Diagnosis not present

## 2021-04-23 DIAGNOSIS — E119 Type 2 diabetes mellitus without complications: Secondary | ICD-10-CM | POA: Diagnosis not present

## 2021-04-23 DIAGNOSIS — M9904 Segmental and somatic dysfunction of sacral region: Secondary | ICD-10-CM | POA: Diagnosis not present

## 2021-04-23 DIAGNOSIS — G9341 Metabolic encephalopathy: Secondary | ICD-10-CM | POA: Diagnosis not present

## 2021-04-23 DIAGNOSIS — M47812 Spondylosis without myelopathy or radiculopathy, cervical region: Secondary | ICD-10-CM | POA: Diagnosis not present

## 2021-04-23 DIAGNOSIS — I6523 Occlusion and stenosis of bilateral carotid arteries: Secondary | ICD-10-CM | POA: Diagnosis not present

## 2021-04-24 DIAGNOSIS — J449 Chronic obstructive pulmonary disease, unspecified: Secondary | ICD-10-CM | POA: Diagnosis not present

## 2021-04-24 DIAGNOSIS — J410 Simple chronic bronchitis: Secondary | ICD-10-CM | POA: Diagnosis not present

## 2021-04-24 DIAGNOSIS — E782 Mixed hyperlipidemia: Secondary | ICD-10-CM | POA: Diagnosis not present

## 2021-04-24 DIAGNOSIS — M797 Fibromyalgia: Secondary | ICD-10-CM | POA: Diagnosis not present

## 2021-04-24 DIAGNOSIS — G43909 Migraine, unspecified, not intractable, without status migrainosus: Secondary | ICD-10-CM | POA: Diagnosis not present

## 2021-04-24 DIAGNOSIS — I6523 Occlusion and stenosis of bilateral carotid arteries: Secondary | ICD-10-CM | POA: Diagnosis not present

## 2021-04-24 DIAGNOSIS — M4800 Spinal stenosis, site unspecified: Secondary | ICD-10-CM | POA: Diagnosis not present

## 2021-04-24 DIAGNOSIS — E119 Type 2 diabetes mellitus without complications: Secondary | ICD-10-CM | POA: Diagnosis not present

## 2021-04-24 DIAGNOSIS — I5022 Chronic systolic (congestive) heart failure: Secondary | ICD-10-CM | POA: Diagnosis not present

## 2021-04-24 DIAGNOSIS — M800AXD Age-related osteoporosis with current pathological fracture, other site, subsequent encounter for fracture with routine healing: Secondary | ICD-10-CM | POA: Diagnosis not present

## 2021-04-24 DIAGNOSIS — E039 Hypothyroidism, unspecified: Secondary | ICD-10-CM | POA: Diagnosis not present

## 2021-04-24 DIAGNOSIS — E0520 Thyrotoxicosis with toxic multinodular goiter without thyrotoxic crisis or storm: Secondary | ICD-10-CM | POA: Diagnosis not present

## 2021-04-24 DIAGNOSIS — E871 Hypo-osmolality and hyponatremia: Secondary | ICD-10-CM | POA: Diagnosis not present

## 2021-04-24 DIAGNOSIS — M150 Primary generalized (osteo)arthritis: Secondary | ICD-10-CM | POA: Diagnosis not present

## 2021-04-24 DIAGNOSIS — I110 Hypertensive heart disease with heart failure: Secondary | ICD-10-CM | POA: Diagnosis not present

## 2021-04-24 DIAGNOSIS — M5136 Other intervertebral disc degeneration, lumbar region: Secondary | ICD-10-CM | POA: Diagnosis not present

## 2021-04-24 DIAGNOSIS — G9341 Metabolic encephalopathy: Secondary | ICD-10-CM | POA: Diagnosis not present

## 2021-04-24 DIAGNOSIS — I272 Pulmonary hypertension, unspecified: Secondary | ICD-10-CM | POA: Diagnosis not present

## 2021-04-24 DIAGNOSIS — M9904 Segmental and somatic dysfunction of sacral region: Secondary | ICD-10-CM | POA: Diagnosis not present

## 2021-04-24 DIAGNOSIS — M47816 Spondylosis without myelopathy or radiculopathy, lumbar region: Secondary | ICD-10-CM | POA: Diagnosis not present

## 2021-04-24 DIAGNOSIS — M5481 Occipital neuralgia: Secondary | ICD-10-CM | POA: Diagnosis not present

## 2021-04-24 DIAGNOSIS — J309 Allergic rhinitis, unspecified: Secondary | ICD-10-CM | POA: Diagnosis not present

## 2021-04-24 DIAGNOSIS — J9611 Chronic respiratory failure with hypoxia: Secondary | ICD-10-CM | POA: Diagnosis not present

## 2021-04-24 DIAGNOSIS — M47812 Spondylosis without myelopathy or radiculopathy, cervical region: Secondary | ICD-10-CM | POA: Diagnosis not present

## 2021-04-26 DIAGNOSIS — J449 Chronic obstructive pulmonary disease, unspecified: Secondary | ICD-10-CM | POA: Diagnosis not present

## 2021-04-28 DIAGNOSIS — R0602 Shortness of breath: Secondary | ICD-10-CM | POA: Diagnosis not present

## 2021-04-29 DIAGNOSIS — M797 Fibromyalgia: Secondary | ICD-10-CM | POA: Diagnosis not present

## 2021-04-29 DIAGNOSIS — J9611 Chronic respiratory failure with hypoxia: Secondary | ICD-10-CM | POA: Diagnosis not present

## 2021-04-29 DIAGNOSIS — M150 Primary generalized (osteo)arthritis: Secondary | ICD-10-CM | POA: Diagnosis not present

## 2021-04-29 DIAGNOSIS — M5136 Other intervertebral disc degeneration, lumbar region: Secondary | ICD-10-CM | POA: Diagnosis not present

## 2021-04-29 DIAGNOSIS — I110 Hypertensive heart disease with heart failure: Secondary | ICD-10-CM | POA: Diagnosis not present

## 2021-04-29 DIAGNOSIS — E782 Mixed hyperlipidemia: Secondary | ICD-10-CM | POA: Diagnosis not present

## 2021-04-29 DIAGNOSIS — G43909 Migraine, unspecified, not intractable, without status migrainosus: Secondary | ICD-10-CM | POA: Diagnosis not present

## 2021-04-29 DIAGNOSIS — G9341 Metabolic encephalopathy: Secondary | ICD-10-CM | POA: Diagnosis not present

## 2021-04-29 DIAGNOSIS — M800AXD Age-related osteoporosis with current pathological fracture, other site, subsequent encounter for fracture with routine healing: Secondary | ICD-10-CM | POA: Diagnosis not present

## 2021-04-29 DIAGNOSIS — I272 Pulmonary hypertension, unspecified: Secondary | ICD-10-CM | POA: Diagnosis not present

## 2021-04-29 DIAGNOSIS — M47816 Spondylosis without myelopathy or radiculopathy, lumbar region: Secondary | ICD-10-CM | POA: Diagnosis not present

## 2021-04-29 DIAGNOSIS — E871 Hypo-osmolality and hyponatremia: Secondary | ICD-10-CM | POA: Diagnosis not present

## 2021-04-29 DIAGNOSIS — M47812 Spondylosis without myelopathy or radiculopathy, cervical region: Secondary | ICD-10-CM | POA: Diagnosis not present

## 2021-04-29 DIAGNOSIS — M4800 Spinal stenosis, site unspecified: Secondary | ICD-10-CM | POA: Diagnosis not present

## 2021-04-29 DIAGNOSIS — M5481 Occipital neuralgia: Secondary | ICD-10-CM | POA: Diagnosis not present

## 2021-04-29 DIAGNOSIS — I6523 Occlusion and stenosis of bilateral carotid arteries: Secondary | ICD-10-CM | POA: Diagnosis not present

## 2021-04-29 DIAGNOSIS — E119 Type 2 diabetes mellitus without complications: Secondary | ICD-10-CM | POA: Diagnosis not present

## 2021-04-29 DIAGNOSIS — I5022 Chronic systolic (congestive) heart failure: Secondary | ICD-10-CM | POA: Diagnosis not present

## 2021-04-29 DIAGNOSIS — J309 Allergic rhinitis, unspecified: Secondary | ICD-10-CM | POA: Diagnosis not present

## 2021-04-29 DIAGNOSIS — J449 Chronic obstructive pulmonary disease, unspecified: Secondary | ICD-10-CM | POA: Diagnosis not present

## 2021-04-29 DIAGNOSIS — J410 Simple chronic bronchitis: Secondary | ICD-10-CM | POA: Diagnosis not present

## 2021-04-29 DIAGNOSIS — E039 Hypothyroidism, unspecified: Secondary | ICD-10-CM | POA: Diagnosis not present

## 2021-04-29 DIAGNOSIS — M9904 Segmental and somatic dysfunction of sacral region: Secondary | ICD-10-CM | POA: Diagnosis not present

## 2021-04-29 DIAGNOSIS — E0520 Thyrotoxicosis with toxic multinodular goiter without thyrotoxic crisis or storm: Secondary | ICD-10-CM | POA: Diagnosis not present

## 2021-05-02 DIAGNOSIS — I5022 Chronic systolic (congestive) heart failure: Secondary | ICD-10-CM | POA: Diagnosis not present

## 2021-05-02 DIAGNOSIS — G43909 Migraine, unspecified, not intractable, without status migrainosus: Secondary | ICD-10-CM | POA: Diagnosis not present

## 2021-05-02 DIAGNOSIS — E119 Type 2 diabetes mellitus without complications: Secondary | ICD-10-CM | POA: Diagnosis not present

## 2021-05-02 DIAGNOSIS — I110 Hypertensive heart disease with heart failure: Secondary | ICD-10-CM | POA: Diagnosis not present

## 2021-05-02 DIAGNOSIS — E871 Hypo-osmolality and hyponatremia: Secondary | ICD-10-CM | POA: Diagnosis not present

## 2021-05-02 DIAGNOSIS — E039 Hypothyroidism, unspecified: Secondary | ICD-10-CM | POA: Diagnosis not present

## 2021-05-02 DIAGNOSIS — M5136 Other intervertebral disc degeneration, lumbar region: Secondary | ICD-10-CM | POA: Diagnosis not present

## 2021-05-02 DIAGNOSIS — I6523 Occlusion and stenosis of bilateral carotid arteries: Secondary | ICD-10-CM | POA: Diagnosis not present

## 2021-05-02 DIAGNOSIS — J309 Allergic rhinitis, unspecified: Secondary | ICD-10-CM | POA: Diagnosis not present

## 2021-05-02 DIAGNOSIS — M800AXD Age-related osteoporosis with current pathological fracture, other site, subsequent encounter for fracture with routine healing: Secondary | ICD-10-CM | POA: Diagnosis not present

## 2021-05-02 DIAGNOSIS — M9904 Segmental and somatic dysfunction of sacral region: Secondary | ICD-10-CM | POA: Diagnosis not present

## 2021-05-02 DIAGNOSIS — E0520 Thyrotoxicosis with toxic multinodular goiter without thyrotoxic crisis or storm: Secondary | ICD-10-CM | POA: Diagnosis not present

## 2021-05-02 DIAGNOSIS — J9611 Chronic respiratory failure with hypoxia: Secondary | ICD-10-CM | POA: Diagnosis not present

## 2021-05-02 DIAGNOSIS — J449 Chronic obstructive pulmonary disease, unspecified: Secondary | ICD-10-CM | POA: Diagnosis not present

## 2021-05-02 DIAGNOSIS — M5481 Occipital neuralgia: Secondary | ICD-10-CM | POA: Diagnosis not present

## 2021-05-02 DIAGNOSIS — M47812 Spondylosis without myelopathy or radiculopathy, cervical region: Secondary | ICD-10-CM | POA: Diagnosis not present

## 2021-05-02 DIAGNOSIS — I272 Pulmonary hypertension, unspecified: Secondary | ICD-10-CM | POA: Diagnosis not present

## 2021-05-02 DIAGNOSIS — E782 Mixed hyperlipidemia: Secondary | ICD-10-CM | POA: Diagnosis not present

## 2021-05-02 DIAGNOSIS — M47816 Spondylosis without myelopathy or radiculopathy, lumbar region: Secondary | ICD-10-CM | POA: Diagnosis not present

## 2021-05-02 DIAGNOSIS — J410 Simple chronic bronchitis: Secondary | ICD-10-CM | POA: Diagnosis not present

## 2021-05-02 DIAGNOSIS — G9341 Metabolic encephalopathy: Secondary | ICD-10-CM | POA: Diagnosis not present

## 2021-05-02 DIAGNOSIS — M150 Primary generalized (osteo)arthritis: Secondary | ICD-10-CM | POA: Diagnosis not present

## 2021-05-02 DIAGNOSIS — M4800 Spinal stenosis, site unspecified: Secondary | ICD-10-CM | POA: Diagnosis not present

## 2021-05-02 DIAGNOSIS — M797 Fibromyalgia: Secondary | ICD-10-CM | POA: Diagnosis not present

## 2021-05-06 DIAGNOSIS — M150 Primary generalized (osteo)arthritis: Secondary | ICD-10-CM | POA: Diagnosis not present

## 2021-05-06 DIAGNOSIS — E039 Hypothyroidism, unspecified: Secondary | ICD-10-CM | POA: Diagnosis not present

## 2021-05-06 DIAGNOSIS — M800AXD Age-related osteoporosis with current pathological fracture, other site, subsequent encounter for fracture with routine healing: Secondary | ICD-10-CM | POA: Diagnosis not present

## 2021-05-06 DIAGNOSIS — M9904 Segmental and somatic dysfunction of sacral region: Secondary | ICD-10-CM | POA: Diagnosis not present

## 2021-05-06 DIAGNOSIS — I272 Pulmonary hypertension, unspecified: Secondary | ICD-10-CM | POA: Diagnosis not present

## 2021-05-06 DIAGNOSIS — M47816 Spondylosis without myelopathy or radiculopathy, lumbar region: Secondary | ICD-10-CM | POA: Diagnosis not present

## 2021-05-06 DIAGNOSIS — I6523 Occlusion and stenosis of bilateral carotid arteries: Secondary | ICD-10-CM | POA: Diagnosis not present

## 2021-05-06 DIAGNOSIS — I110 Hypertensive heart disease with heart failure: Secondary | ICD-10-CM | POA: Diagnosis not present

## 2021-05-06 DIAGNOSIS — E0520 Thyrotoxicosis with toxic multinodular goiter without thyrotoxic crisis or storm: Secondary | ICD-10-CM | POA: Diagnosis not present

## 2021-05-06 DIAGNOSIS — J449 Chronic obstructive pulmonary disease, unspecified: Secondary | ICD-10-CM | POA: Diagnosis not present

## 2021-05-06 DIAGNOSIS — J410 Simple chronic bronchitis: Secondary | ICD-10-CM | POA: Diagnosis not present

## 2021-05-06 DIAGNOSIS — E871 Hypo-osmolality and hyponatremia: Secondary | ICD-10-CM | POA: Diagnosis not present

## 2021-05-06 DIAGNOSIS — G43909 Migraine, unspecified, not intractable, without status migrainosus: Secondary | ICD-10-CM | POA: Diagnosis not present

## 2021-05-06 DIAGNOSIS — M797 Fibromyalgia: Secondary | ICD-10-CM | POA: Diagnosis not present

## 2021-05-06 DIAGNOSIS — G9341 Metabolic encephalopathy: Secondary | ICD-10-CM | POA: Diagnosis not present

## 2021-05-06 DIAGNOSIS — I5022 Chronic systolic (congestive) heart failure: Secondary | ICD-10-CM | POA: Diagnosis not present

## 2021-05-06 DIAGNOSIS — M5481 Occipital neuralgia: Secondary | ICD-10-CM | POA: Diagnosis not present

## 2021-05-06 DIAGNOSIS — M5136 Other intervertebral disc degeneration, lumbar region: Secondary | ICD-10-CM | POA: Diagnosis not present

## 2021-05-06 DIAGNOSIS — J309 Allergic rhinitis, unspecified: Secondary | ICD-10-CM | POA: Diagnosis not present

## 2021-05-06 DIAGNOSIS — M47812 Spondylosis without myelopathy or radiculopathy, cervical region: Secondary | ICD-10-CM | POA: Diagnosis not present

## 2021-05-06 DIAGNOSIS — E782 Mixed hyperlipidemia: Secondary | ICD-10-CM | POA: Diagnosis not present

## 2021-05-06 DIAGNOSIS — J9611 Chronic respiratory failure with hypoxia: Secondary | ICD-10-CM | POA: Diagnosis not present

## 2021-05-06 DIAGNOSIS — E119 Type 2 diabetes mellitus without complications: Secondary | ICD-10-CM | POA: Diagnosis not present

## 2021-05-06 DIAGNOSIS — M4800 Spinal stenosis, site unspecified: Secondary | ICD-10-CM | POA: Diagnosis not present

## 2021-05-11 DIAGNOSIS — M25561 Pain in right knee: Secondary | ICD-10-CM | POA: Diagnosis not present

## 2021-05-11 DIAGNOSIS — S82009D Unspecified fracture of unspecified patella, subsequent encounter for closed fracture with routine healing: Secondary | ICD-10-CM | POA: Diagnosis not present

## 2021-05-11 DIAGNOSIS — S42402D Unspecified fracture of lower end of left humerus, subsequent encounter for fracture with routine healing: Secondary | ICD-10-CM | POA: Diagnosis not present

## 2021-05-13 ENCOUNTER — Encounter: Payer: Self-pay | Admitting: Internal Medicine

## 2021-05-14 DIAGNOSIS — R296 Repeated falls: Secondary | ICD-10-CM | POA: Diagnosis not present

## 2021-05-14 DIAGNOSIS — M797 Fibromyalgia: Secondary | ICD-10-CM | POA: Diagnosis not present

## 2021-05-14 DIAGNOSIS — I6523 Occlusion and stenosis of bilateral carotid arteries: Secondary | ICD-10-CM | POA: Diagnosis not present

## 2021-05-14 DIAGNOSIS — J449 Chronic obstructive pulmonary disease, unspecified: Secondary | ICD-10-CM | POA: Diagnosis not present

## 2021-05-14 DIAGNOSIS — M1990 Unspecified osteoarthritis, unspecified site: Secondary | ICD-10-CM | POA: Diagnosis not present

## 2021-05-14 DIAGNOSIS — J9611 Chronic respiratory failure with hypoxia: Secondary | ICD-10-CM | POA: Diagnosis not present

## 2021-05-14 DIAGNOSIS — M5481 Occipital neuralgia: Secondary | ICD-10-CM | POA: Diagnosis not present

## 2021-05-14 DIAGNOSIS — G43909 Migraine, unspecified, not intractable, without status migrainosus: Secondary | ICD-10-CM | POA: Diagnosis not present

## 2021-05-14 DIAGNOSIS — M150 Primary generalized (osteo)arthritis: Secondary | ICD-10-CM | POA: Diagnosis not present

## 2021-05-14 DIAGNOSIS — E119 Type 2 diabetes mellitus without complications: Secondary | ICD-10-CM | POA: Diagnosis not present

## 2021-05-14 DIAGNOSIS — G9341 Metabolic encephalopathy: Secondary | ICD-10-CM | POA: Diagnosis not present

## 2021-05-14 DIAGNOSIS — M800AXD Age-related osteoporosis with current pathological fracture, other site, subsequent encounter for fracture with routine healing: Secondary | ICD-10-CM | POA: Diagnosis not present

## 2021-05-14 DIAGNOSIS — I5022 Chronic systolic (congestive) heart failure: Secondary | ICD-10-CM | POA: Diagnosis not present

## 2021-05-14 DIAGNOSIS — E0520 Thyrotoxicosis with toxic multinodular goiter without thyrotoxic crisis or storm: Secondary | ICD-10-CM | POA: Diagnosis not present

## 2021-05-14 DIAGNOSIS — J309 Allergic rhinitis, unspecified: Secondary | ICD-10-CM | POA: Diagnosis not present

## 2021-05-14 DIAGNOSIS — E871 Hypo-osmolality and hyponatremia: Secondary | ICD-10-CM | POA: Diagnosis not present

## 2021-05-14 DIAGNOSIS — I272 Pulmonary hypertension, unspecified: Secondary | ICD-10-CM | POA: Diagnosis not present

## 2021-05-14 DIAGNOSIS — M4800 Spinal stenosis, site unspecified: Secondary | ICD-10-CM | POA: Diagnosis not present

## 2021-05-14 DIAGNOSIS — M9904 Segmental and somatic dysfunction of sacral region: Secondary | ICD-10-CM | POA: Diagnosis not present

## 2021-05-14 DIAGNOSIS — M47812 Spondylosis without myelopathy or radiculopathy, cervical region: Secondary | ICD-10-CM | POA: Diagnosis not present

## 2021-05-14 DIAGNOSIS — M47816 Spondylosis without myelopathy or radiculopathy, lumbar region: Secondary | ICD-10-CM | POA: Diagnosis not present

## 2021-05-14 DIAGNOSIS — J410 Simple chronic bronchitis: Secondary | ICD-10-CM | POA: Diagnosis not present

## 2021-05-14 DIAGNOSIS — I110 Hypertensive heart disease with heart failure: Secondary | ICD-10-CM | POA: Diagnosis not present

## 2021-05-14 DIAGNOSIS — E782 Mixed hyperlipidemia: Secondary | ICD-10-CM | POA: Diagnosis not present

## 2021-05-14 DIAGNOSIS — M5136 Other intervertebral disc degeneration, lumbar region: Secondary | ICD-10-CM | POA: Diagnosis not present

## 2021-05-14 DIAGNOSIS — E039 Hypothyroidism, unspecified: Secondary | ICD-10-CM | POA: Diagnosis not present

## 2021-05-16 ENCOUNTER — Telehealth: Payer: Self-pay

## 2021-05-16 NOTE — Telephone Encounter (Signed)
Revised recert for Albuterol signed by provider and placed in [facility] folder.

## 2021-05-20 ENCOUNTER — Other Ambulatory Visit: Payer: Self-pay | Admitting: Nurse Practitioner

## 2021-05-20 DIAGNOSIS — J449 Chronic obstructive pulmonary disease, unspecified: Secondary | ICD-10-CM

## 2021-05-22 DIAGNOSIS — E0520 Thyrotoxicosis with toxic multinodular goiter without thyrotoxic crisis or storm: Secondary | ICD-10-CM | POA: Diagnosis not present

## 2021-05-22 DIAGNOSIS — M797 Fibromyalgia: Secondary | ICD-10-CM | POA: Diagnosis not present

## 2021-05-22 DIAGNOSIS — I5022 Chronic systolic (congestive) heart failure: Secondary | ICD-10-CM | POA: Diagnosis not present

## 2021-05-22 DIAGNOSIS — M150 Primary generalized (osteo)arthritis: Secondary | ICD-10-CM | POA: Diagnosis not present

## 2021-05-22 DIAGNOSIS — I272 Pulmonary hypertension, unspecified: Secondary | ICD-10-CM | POA: Diagnosis not present

## 2021-05-22 DIAGNOSIS — M9904 Segmental and somatic dysfunction of sacral region: Secondary | ICD-10-CM | POA: Diagnosis not present

## 2021-05-22 DIAGNOSIS — M4800 Spinal stenosis, site unspecified: Secondary | ICD-10-CM | POA: Diagnosis not present

## 2021-05-22 DIAGNOSIS — E039 Hypothyroidism, unspecified: Secondary | ICD-10-CM | POA: Diagnosis not present

## 2021-05-22 DIAGNOSIS — M800AXD Age-related osteoporosis with current pathological fracture, other site, subsequent encounter for fracture with routine healing: Secondary | ICD-10-CM | POA: Diagnosis not present

## 2021-05-22 DIAGNOSIS — M5136 Other intervertebral disc degeneration, lumbar region: Secondary | ICD-10-CM | POA: Diagnosis not present

## 2021-05-22 DIAGNOSIS — E119 Type 2 diabetes mellitus without complications: Secondary | ICD-10-CM | POA: Diagnosis not present

## 2021-05-22 DIAGNOSIS — E871 Hypo-osmolality and hyponatremia: Secondary | ICD-10-CM | POA: Diagnosis not present

## 2021-05-22 DIAGNOSIS — J9611 Chronic respiratory failure with hypoxia: Secondary | ICD-10-CM | POA: Diagnosis not present

## 2021-05-22 DIAGNOSIS — M47816 Spondylosis without myelopathy or radiculopathy, lumbar region: Secondary | ICD-10-CM | POA: Diagnosis not present

## 2021-05-22 DIAGNOSIS — M47812 Spondylosis without myelopathy or radiculopathy, cervical region: Secondary | ICD-10-CM | POA: Diagnosis not present

## 2021-05-22 DIAGNOSIS — M5481 Occipital neuralgia: Secondary | ICD-10-CM | POA: Diagnosis not present

## 2021-05-22 DIAGNOSIS — G43909 Migraine, unspecified, not intractable, without status migrainosus: Secondary | ICD-10-CM | POA: Diagnosis not present

## 2021-05-22 DIAGNOSIS — J309 Allergic rhinitis, unspecified: Secondary | ICD-10-CM | POA: Diagnosis not present

## 2021-05-22 DIAGNOSIS — I6523 Occlusion and stenosis of bilateral carotid arteries: Secondary | ICD-10-CM | POA: Diagnosis not present

## 2021-05-22 DIAGNOSIS — I110 Hypertensive heart disease with heart failure: Secondary | ICD-10-CM | POA: Diagnosis not present

## 2021-05-22 DIAGNOSIS — J449 Chronic obstructive pulmonary disease, unspecified: Secondary | ICD-10-CM | POA: Diagnosis not present

## 2021-05-22 DIAGNOSIS — J410 Simple chronic bronchitis: Secondary | ICD-10-CM | POA: Diagnosis not present

## 2021-05-22 DIAGNOSIS — G9341 Metabolic encephalopathy: Secondary | ICD-10-CM | POA: Diagnosis not present

## 2021-05-22 DIAGNOSIS — E782 Mixed hyperlipidemia: Secondary | ICD-10-CM | POA: Diagnosis not present

## 2021-05-27 ENCOUNTER — Telehealth: Payer: Self-pay

## 2021-05-27 DIAGNOSIS — J449 Chronic obstructive pulmonary disease, unspecified: Secondary | ICD-10-CM | POA: Diagnosis not present

## 2021-05-27 NOTE — Telephone Encounter (Signed)
Faxed [facility] duoneb refills

## 2021-05-28 ENCOUNTER — Telehealth: Payer: Self-pay

## 2021-05-28 DIAGNOSIS — M9904 Segmental and somatic dysfunction of sacral region: Secondary | ICD-10-CM | POA: Diagnosis not present

## 2021-05-28 DIAGNOSIS — G43909 Migraine, unspecified, not intractable, without status migrainosus: Secondary | ICD-10-CM | POA: Diagnosis not present

## 2021-05-28 DIAGNOSIS — E039 Hypothyroidism, unspecified: Secondary | ICD-10-CM | POA: Diagnosis not present

## 2021-05-28 DIAGNOSIS — I5022 Chronic systolic (congestive) heart failure: Secondary | ICD-10-CM | POA: Diagnosis not present

## 2021-05-28 DIAGNOSIS — M5136 Other intervertebral disc degeneration, lumbar region: Secondary | ICD-10-CM | POA: Diagnosis not present

## 2021-05-28 DIAGNOSIS — G9341 Metabolic encephalopathy: Secondary | ICD-10-CM | POA: Diagnosis not present

## 2021-05-28 DIAGNOSIS — I6523 Occlusion and stenosis of bilateral carotid arteries: Secondary | ICD-10-CM | POA: Diagnosis not present

## 2021-05-28 DIAGNOSIS — E782 Mixed hyperlipidemia: Secondary | ICD-10-CM | POA: Diagnosis not present

## 2021-05-28 DIAGNOSIS — J410 Simple chronic bronchitis: Secondary | ICD-10-CM | POA: Diagnosis not present

## 2021-05-28 DIAGNOSIS — E0520 Thyrotoxicosis with toxic multinodular goiter without thyrotoxic crisis or storm: Secondary | ICD-10-CM | POA: Diagnosis not present

## 2021-05-28 DIAGNOSIS — M150 Primary generalized (osteo)arthritis: Secondary | ICD-10-CM | POA: Diagnosis not present

## 2021-05-28 DIAGNOSIS — M4800 Spinal stenosis, site unspecified: Secondary | ICD-10-CM | POA: Diagnosis not present

## 2021-05-28 DIAGNOSIS — M800AXD Age-related osteoporosis with current pathological fracture, other site, subsequent encounter for fracture with routine healing: Secondary | ICD-10-CM | POA: Diagnosis not present

## 2021-05-28 DIAGNOSIS — I272 Pulmonary hypertension, unspecified: Secondary | ICD-10-CM | POA: Diagnosis not present

## 2021-05-28 DIAGNOSIS — J9611 Chronic respiratory failure with hypoxia: Secondary | ICD-10-CM | POA: Diagnosis not present

## 2021-05-28 DIAGNOSIS — M5481 Occipital neuralgia: Secondary | ICD-10-CM | POA: Diagnosis not present

## 2021-05-28 DIAGNOSIS — E871 Hypo-osmolality and hyponatremia: Secondary | ICD-10-CM | POA: Diagnosis not present

## 2021-05-28 DIAGNOSIS — E119 Type 2 diabetes mellitus without complications: Secondary | ICD-10-CM | POA: Diagnosis not present

## 2021-05-28 DIAGNOSIS — J309 Allergic rhinitis, unspecified: Secondary | ICD-10-CM | POA: Diagnosis not present

## 2021-05-28 DIAGNOSIS — M47816 Spondylosis without myelopathy or radiculopathy, lumbar region: Secondary | ICD-10-CM | POA: Diagnosis not present

## 2021-05-28 DIAGNOSIS — M797 Fibromyalgia: Secondary | ICD-10-CM | POA: Diagnosis not present

## 2021-05-28 DIAGNOSIS — J449 Chronic obstructive pulmonary disease, unspecified: Secondary | ICD-10-CM | POA: Diagnosis not present

## 2021-05-28 DIAGNOSIS — M47812 Spondylosis without myelopathy or radiculopathy, cervical region: Secondary | ICD-10-CM | POA: Diagnosis not present

## 2021-05-28 DIAGNOSIS — I110 Hypertensive heart disease with heart failure: Secondary | ICD-10-CM | POA: Diagnosis not present

## 2021-05-28 NOTE — Telephone Encounter (Signed)
Verbal order for Albutrol signed by provider and placed in [facility] folder.

## 2021-05-29 ENCOUNTER — Telehealth (INDEPENDENT_AMBULATORY_CARE_PROVIDER_SITE_OTHER): Payer: Commercial Managed Care - HMO | Admitting: Nurse Practitioner

## 2021-05-29 ENCOUNTER — Encounter: Payer: Self-pay | Admitting: Nurse Practitioner

## 2021-05-29 VITALS — Resp 16 | Ht 66.0 in | Wt 109.4 lb

## 2021-05-29 DIAGNOSIS — J449 Chronic obstructive pulmonary disease, unspecified: Secondary | ICD-10-CM

## 2021-05-29 DIAGNOSIS — R0902 Hypoxemia: Secondary | ICD-10-CM

## 2021-05-29 DIAGNOSIS — F5101 Primary insomnia: Secondary | ICD-10-CM

## 2021-05-29 DIAGNOSIS — Z9981 Dependence on supplemental oxygen: Secondary | ICD-10-CM | POA: Diagnosis not present

## 2021-05-29 DIAGNOSIS — R0602 Shortness of breath: Secondary | ICD-10-CM | POA: Diagnosis not present

## 2021-05-29 DIAGNOSIS — R42 Dizziness and giddiness: Secondary | ICD-10-CM | POA: Diagnosis not present

## 2021-05-29 DIAGNOSIS — J9611 Chronic respiratory failure with hypoxia: Secondary | ICD-10-CM

## 2021-05-29 MED ORDER — SYMBICORT 160-4.5 MCG/ACT IN AERO: 2.0000 | INHALATION_SPRAY | Freq: Two times a day (BID) | RESPIRATORY_TRACT | 3 refills | Status: DC

## 2021-05-29 MED ORDER — MIRTAZAPINE 15 MG PO TABS: 15.0000 mg | ORAL_TABLET | Freq: Every day | ORAL | 1 refills | Status: DC

## 2021-05-29 MED ORDER — LORAZEPAM 1 MG PO TABS: 1.0000 mg | ORAL_TABLET | Freq: Two times a day (BID) | ORAL | 2 refills | Status: DC | PRN

## 2021-05-29 NOTE — Progress Notes (Signed)
[facility] [address] [place], [place] [address]  Internal MEDICINE  Telephone Visit  Patient Name: Danielle KaysBetty C Poole  [id number]  [id number]  Date of Service: 05/29/2021  I connected with the patient at 12:00 PM by telephone and verified the patients identity using two identifiers.   I discussed the limitations, risks, security and privacy concerns of performing an evaluation and management service by telephone and the availability of in person appointments. I also discussed with the patient that there may be a patient responsible charge related to the service.  The patient expressed understanding and agrees to proceed.    Chief Complaint  Patient presents with   Acute Visit    Pt wants portable oxygen from a company in [place], pt wants a rechargeable device and tanks she can handle herself without assistance, pt is having difficulty falling and staying asleep   Telephone Assessment    Video    Telephone Screen    [phone]    HPI Danielle RhodesBetty presents for a telehealth virtual visit for follow up related to anxiety, sleep and supplemental oxygen. She still has difficulty coming into the office for visit. She had a femur fracture a few months ago and the recovery process has been slow. She reports today that she is still in physical therapy rehab but has a hovaround motorized scooter and is being trained on how to use it. Because she will be more mobile, she is requesting portable oxygen concentrator that is rechargeable and that she can handle on her own without assistance.  She also reports that she is having difficulty falling asleep and staying asleep. She current takes mirtazapine 7.5 mg, lorazepam 1 mg and tramadol 50 mg at bedtime. She also happens to be underweight still with BMI of 17.66.  -requesting a few medication refills.    Current Medication: Outpatient Encounter Medications as of 05/29/2021  Medication Sig   acetaminophen (TYLENOL) 500 MG tablet Take 1,000  mg by mouth in the morning, at noon, and at bedtime.   albuterol (VENTOLIN HFA) 108 (90 Base) MCG/ACT inhaler INAHLE 2 PUFFS INTO THE LUNGS EVERY 6 HOURS AS NEEDED FOR WHEEZING OR SHORTNESS OF BREATH   alendronate (FOSAMAX) 70 MG tablet Take 1 tablet (70 mg total) by mouth every Tuesday.   aspirin EC 81 MG tablet Take 81 mg by mouth daily. Swallow whole.   Azelastine HCl 137 MCG/SPRAY SOLN Place 2 sprays into both nostrils in the morning and at bedtime.   Ergocalciferol 50 MCG (2000 UT) TABS Take 2 capsules by mouth daily.   FLUoxetine (PROZAC) 40 MG capsule Take 1 capsule (40 mg total) by mouth daily as needed.   fluticasone (FLONASE) 50 MCG/ACT nasal spray Place 2 sprays into both nostrils in the morning and at bedtime.   hydrocortisone 2.5 % cream Place 1 application rectally 2 (two) times daily as needed (itching/hemorroids).    ipratropium-albuterol (DUONEB) 0.5-2.5 (3) MG/3ML SOLN One vial 3 x  aday for copd with hypoxia   levothyroxine (SYNTHROID) 88 MCG tablet Take 1 tablet (88 mcg total) by mouth daily before breakfast. Take 30 minutes before food and with a full glass of water   lubiprostone (AMITIZA) 8 MCG capsule Take 1 capsule (8 mcg total) by mouth daily with breakfast.   meclizine (ANTIVERT) 25 MG tablet Take 1 tablet (25 mg total) by mouth 3 (three) times daily as needed for dizziness or nausea.   mirtazapine (REMERON) 15 MG tablet Take 1 tablet (15 mg total) by mouth  at bedtime.   montelukast (SINGULAIR) 10 MG tablet Take 1 tablet (10 mg total) by mouth at bedtime.   Multiple Vitamin (MULTIVITAMIN WITH MINERALS) TABS tablet Take 1 tablet by mouth daily. Multivitamin for Seniors   ondansetron (ZOFRAN) 4 MG tablet Take 1 tablet (4 mg total) by mouth every 6 (six) hours as needed for nausea.   rosuvastatin (CRESTOR) 20 MG tablet Take 1 tablet (20 mg total) by mouth daily.   tiotropium (SPIRIVA HANDIHALER) 18 MCG inhalation capsule Place 1 capsule (18 mcg total) into inhaler and inhale  daily.   traMADol (ULTRAM) 50 MG tablet Take 1 tablet (50 mg total) by mouth 2 (two) times daily as needed for moderate pain or severe pain.   [DISCONTINUED] LORazepam (ATIVAN) 1 MG tablet Take 1 tablet (1 mg total) by mouth 2 (two) times daily as needed for anxiety.   [DISCONTINUED] mirtazapine (REMERON) 7.5 MG tablet Take 1 tablet (7.5 mg total) by mouth at bedtime.   [DISCONTINUED] SYMBICORT 160-4.5 MCG/ACT inhaler Inhale 2 puffs into the lungs 2 (two) times daily.   [START ON 06/18/2021] LORazepam (ATIVAN) 1 MG tablet Take 1 tablet (1 mg total) by mouth 2 (two) times daily as needed for anxiety.   SYMBICORT 160-4.5 MCG/ACT inhaler Inhale 2 puffs into the lungs 2 (two) times daily.   [DISCONTINUED] enoxaparin (LOVENOX) 40 MG/0.4ML injection Inject 0.4 mLs (40 mg total) into the skin daily. (Patient not taking: Reported on 12/14/2020)   No facility-administered encounter medications on file as of 05/29/2021.    Surgical History: Past Surgical History:  Procedure Laterality Date   ABDOMINAL HYSTERECTOMY     CAST APPLICATION  11/19/2020   Procedure: Application of long-leg hinged brace right lower extremity.;  Surgeon: Christena Flake, MD;  Location: [facility] ORS;  Service: Orthopedics;;   ESOPHAGOGASTRODUODENOSCOPY (EGD) WITH PROPOFOL N/A 01/10/2015   Procedure: ESOPHAGOGASTRODUODENOSCOPY (EGD) WITH PROPOFOL;  Surgeon: Wallace Cullens, MD;  Location: [facility] ENDOSCOPY;  Service: Gastroenterology;  Laterality: N/A;   ORIF ELBOW FRACTURE Left 11/19/2020   Procedure: OPEN REDUCTION INTERNAL FIXATION (ORIF) ELBOW/OLECRANON FRACTURE;  Surgeon: Christena Flake, MD;  Location: [facility] ORS;  Service: Orthopedics;  Laterality: Left;   RECTAL SURGERY     RIGHT/LEFT HEART CATH AND CORONARY ANGIOGRAPHY Bilateral 04/08/2020   Procedure: RIGHT/LEFT HEART CATH AND CORONARY ANGIOGRAPHY;  Surgeon: Iran Ouch, MD;  Location: [facility] INVASIVE CV LAB;  Service: Cardiovascular;  Laterality: Bilateral;   SAVORY DILATION N/A 01/10/2015    Procedure: SAVORY DILATION;  Surgeon: Wallace Cullens, MD;  Location: [facility] ENDOSCOPY;  Service: Gastroenterology;  Laterality: N/A;   toenail removal Bilateral    great toes   TONSILLECTOMY AND ADENOIDECTOMY      Medical History: Past Medical History:  Diagnosis Date   Anxiety    Chest pain    CHF (congestive heart failure) (HCC)    COPD (chronic obstructive pulmonary disease) (HCC)    Depression    Diabetes mellitus without complication (HCC)    Fibromyalgia    Graves disease    Hypertension    IBS (irritable bowel syndrome)    PTSD (post-traumatic stress disorder)    Sinus problem    Spinal stenosis     Family History: Family History  Problem Relation Age of Onset   Alcohol abuse Mother    Depression Mother    Varicose Veins Mother    Alcohol abuse Father    Depression Father    Alcohol abuse Sister    Asthma Sister  COPD Sister    Depression Sister    Heart disease Sister    Hypertension Sister    Diabetes Sister    Depression Maternal Grandmother    Diabetes Paternal Grandmother    Stroke Paternal Grandmother     Social History   Socioeconomic History   Marital status: Widowed    Spouse name: Not on file   Number of children: 2   Years of education: college   Highest education level: Not on file  Occupational History   Not on file  Tobacco Use   Smoking status: Former    Packs/day: 0.50    Years: 25.00    Pack years: 12.50    Types: Cigarettes    Quit date: 06/03/2016    Years since quitting: 4.9   Smokeless tobacco: Never  Vaping Use   Vaping Use: Never used  Substance and Sexual Activity   Alcohol use: No    Alcohol/week: 0.0 standard drinks   Drug use: No   Sexual activity: Not Currently  Other Topics Concern   Not on file  Social History Narrative   Lives by herself; 1 daughter Marcelino Duster in [place]: 1 daughter in [place]    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   Social Determinants of Health   Financial Resource  Strain: Not on file  Food Insecurity: No Food Insecurity   Worried About Programme researcher, broadcasting/film/video in the Last Year: Never true   Barista in the Last Year: Never true  Transportation Needs: No Transportation Needs   Lack of Transportation (Medical): No   Lack of Transportation (Non-Medical): No  Physical Activity: Not on file  Stress: No Stress Concern Present   Feeling of Stress : Only a little  Social Connections: Not on file  Intimate Partner Violence: Not At Risk   Fear of Current or Ex-Partner: No   Emotionally Abused: No   Physically Abused: No   Sexually Abused: No      Review of Systems  Constitutional:  Negative for chills, fatigue and unexpected weight change.  HENT:  Negative for congestion, rhinorrhea, sneezing and sore throat.   Eyes:  Negative for redness.  Respiratory:  Negative for cough, chest tightness and shortness of breath.   Cardiovascular:  Negative for chest pain and palpitations.  Gastrointestinal:  Negative for abdominal pain, constipation, diarrhea, nausea and vomiting.  Genitourinary:  Negative for dysuria and frequency.  Musculoskeletal:  Negative for arthralgias, back pain, joint swelling and neck pain.  Skin:  Negative for rash.  Neurological: Negative.  Negative for tremors and numbness.  Hematological:  Negative for adenopathy. Does not bruise/bleed easily.  Psychiatric/Behavioral:  Negative for behavioral problems (Depression), sleep disturbance and suicidal ideas. The patient is not nervous/anxious.    Vital Signs: Resp 16    Ht 5\' 6"  (1.676 m)    Wt 109 lb 6.4 oz (49.6 kg)    BMI 17.66 kg/m    Observation/Objective: She is alert and oriented and engages in conversation appropriately. She does not appear to be in any acute distress over video call and is wearing her nasal cannula for her oxygen.     Assessment/Plan: 1. COPD with hypoxia (HCC) Symbicort refill ordered. DME portable rechargeable oxygen ordered. Patient is more mobile  now with PT and getting a hovaround motorized scooter and needs her oxygen to be portable too.  - SYMBICORT 160-4.5 MCG/ACT inhaler; Inhale 2 puffs into the lungs 2 (two) times daily.  Dispense: 1 each;  Refill: 3 - For home use only DME oxygen  2. Chronic respiratory failure with hypoxia (HCC) See problem #1 - For home use only DME oxygen  3. Oxygen dependent Wears continuous oxygen, needs portable oxygen.  - For home use only DME oxygen  4. Dizziness Resolved. Patient is not having an issue anymore but she has a prescription for meclizine if needed.   5. Primary insomnia Refill of lorazepam done. Mirtazapine dose increased to help patient sleep better. Patient is also underweight so increase mirtazapine dose may help her gain weight as well.  - mirtazapine (REMERON) 15 MG tablet; Take 1 tablet (15 mg total) by mouth at bedtime.  Dispense: 90 tablet; Refill: 1 - LORazepam (ATIVAN) 1 MG tablet; Take 1 tablet (1 mg total) by mouth 2 (two) times daily as needed for anxiety.  Dispense: 60 tablet; Refill: 2    General Counseling: Yazlin verbalizes understanding of the findings of today's phone visit and agrees with plan of treatment. I have discussed any further diagnostic evaluation that may be needed or ordered today. We also reviewed her medications today. she has been encouraged to call the office with any questions or concerns that should arise related to todays visit.  Return in about 1 month (around 06/29/2021) for F/U to discuss increased mirtazapine dose and sleep, Kunta Hilleary PCP.   No orders of the defined types were placed in this encounter.   Meds ordered this encounter  Medications   mirtazapine (REMERON) 15 MG tablet    Sig: Take 1 tablet (15 mg total) by mouth at bedtime.    Dispense:  90 tablet    Refill:  1    Please note increased dose, discontinue 7.5 mg tablet   SYMBICORT 160-4.5 MCG/ACT inhaler    Sig: Inhale 2 puffs into the lungs 2 (two) times daily.    Dispense:  1  each    Refill:  3   LORazepam (ATIVAN) 1 MG tablet    Sig: Take 1 tablet (1 mg total) by mouth 2 (two) times daily as needed for anxiety.    Dispense:  60 tablet    Refill:  2    Note change in frequency, discontinue lorazepam daily at bedtime    Time spent:15 Minutes Time spent with patient included reviewing progress notes, labs, imaging studies, and discussing plan for follow up.  [place] Controlled Substance Database was reviewed by me for overdose risk score (ORS) if appropriate.  This patient was seen by Sallyanne KusterAlyssa Karlin Binion, FNP-C in collaboration with Dr. Beverely RisenFozia Khan as a part of collaborative care agreement.  Antonae Zbikowski R. Tedd SiasAbernathy, MSN, FNP-C Internal medicine

## 2021-06-06 DIAGNOSIS — J9611 Chronic respiratory failure with hypoxia: Secondary | ICD-10-CM | POA: Diagnosis not present

## 2021-06-06 DIAGNOSIS — M5136 Other intervertebral disc degeneration, lumbar region: Secondary | ICD-10-CM | POA: Diagnosis not present

## 2021-06-06 DIAGNOSIS — E0520 Thyrotoxicosis with toxic multinodular goiter without thyrotoxic crisis or storm: Secondary | ICD-10-CM | POA: Diagnosis not present

## 2021-06-06 DIAGNOSIS — M797 Fibromyalgia: Secondary | ICD-10-CM | POA: Diagnosis not present

## 2021-06-06 DIAGNOSIS — E871 Hypo-osmolality and hyponatremia: Secondary | ICD-10-CM | POA: Diagnosis not present

## 2021-06-06 DIAGNOSIS — I110 Hypertensive heart disease with heart failure: Secondary | ICD-10-CM | POA: Diagnosis not present

## 2021-06-06 DIAGNOSIS — M800AXD Age-related osteoporosis with current pathological fracture, other site, subsequent encounter for fracture with routine healing: Secondary | ICD-10-CM | POA: Diagnosis not present

## 2021-06-06 DIAGNOSIS — M5481 Occipital neuralgia: Secondary | ICD-10-CM | POA: Diagnosis not present

## 2021-06-06 DIAGNOSIS — J410 Simple chronic bronchitis: Secondary | ICD-10-CM | POA: Diagnosis not present

## 2021-06-06 DIAGNOSIS — M47816 Spondylosis without myelopathy or radiculopathy, lumbar region: Secondary | ICD-10-CM | POA: Diagnosis not present

## 2021-06-06 DIAGNOSIS — M9904 Segmental and somatic dysfunction of sacral region: Secondary | ICD-10-CM | POA: Diagnosis not present

## 2021-06-06 DIAGNOSIS — J449 Chronic obstructive pulmonary disease, unspecified: Secondary | ICD-10-CM | POA: Diagnosis not present

## 2021-06-06 DIAGNOSIS — G9341 Metabolic encephalopathy: Secondary | ICD-10-CM | POA: Diagnosis not present

## 2021-06-06 DIAGNOSIS — M150 Primary generalized (osteo)arthritis: Secondary | ICD-10-CM | POA: Diagnosis not present

## 2021-06-06 DIAGNOSIS — M4800 Spinal stenosis, site unspecified: Secondary | ICD-10-CM | POA: Diagnosis not present

## 2021-06-06 DIAGNOSIS — J309 Allergic rhinitis, unspecified: Secondary | ICD-10-CM | POA: Diagnosis not present

## 2021-06-06 DIAGNOSIS — E119 Type 2 diabetes mellitus without complications: Secondary | ICD-10-CM | POA: Diagnosis not present

## 2021-06-06 DIAGNOSIS — E782 Mixed hyperlipidemia: Secondary | ICD-10-CM | POA: Diagnosis not present

## 2021-06-06 DIAGNOSIS — I5022 Chronic systolic (congestive) heart failure: Secondary | ICD-10-CM | POA: Diagnosis not present

## 2021-06-06 DIAGNOSIS — I272 Pulmonary hypertension, unspecified: Secondary | ICD-10-CM | POA: Diagnosis not present

## 2021-06-06 DIAGNOSIS — M47812 Spondylosis without myelopathy or radiculopathy, cervical region: Secondary | ICD-10-CM | POA: Diagnosis not present

## 2021-06-06 DIAGNOSIS — I6523 Occlusion and stenosis of bilateral carotid arteries: Secondary | ICD-10-CM | POA: Diagnosis not present

## 2021-06-06 DIAGNOSIS — E039 Hypothyroidism, unspecified: Secondary | ICD-10-CM | POA: Diagnosis not present

## 2021-06-06 DIAGNOSIS — G43909 Migraine, unspecified, not intractable, without status migrainosus: Secondary | ICD-10-CM | POA: Diagnosis not present

## 2021-06-10 DIAGNOSIS — J449 Chronic obstructive pulmonary disease, unspecified: Secondary | ICD-10-CM | POA: Diagnosis not present

## 2021-06-11 DIAGNOSIS — M25561 Pain in right knee: Secondary | ICD-10-CM | POA: Diagnosis not present

## 2021-06-11 DIAGNOSIS — S42402D Unspecified fracture of lower end of left humerus, subsequent encounter for fracture with routine healing: Secondary | ICD-10-CM | POA: Diagnosis not present

## 2021-06-11 DIAGNOSIS — S82009D Unspecified fracture of unspecified patella, subsequent encounter for closed fracture with routine healing: Secondary | ICD-10-CM | POA: Diagnosis not present

## 2021-06-14 DIAGNOSIS — J9611 Chronic respiratory failure with hypoxia: Secondary | ICD-10-CM | POA: Diagnosis not present

## 2021-06-14 DIAGNOSIS — M1990 Unspecified osteoarthritis, unspecified site: Secondary | ICD-10-CM | POA: Diagnosis not present

## 2021-06-14 DIAGNOSIS — R296 Repeated falls: Secondary | ICD-10-CM | POA: Diagnosis not present

## 2021-06-14 DIAGNOSIS — J449 Chronic obstructive pulmonary disease, unspecified: Secondary | ICD-10-CM | POA: Diagnosis not present

## 2021-06-24 ENCOUNTER — Encounter: Payer: Self-pay | Admitting: Nurse Practitioner

## 2021-06-24 ENCOUNTER — Ambulatory Visit (INDEPENDENT_AMBULATORY_CARE_PROVIDER_SITE_OTHER): Payer: Medicare Other | Admitting: Nurse Practitioner

## 2021-06-24 ENCOUNTER — Other Ambulatory Visit: Payer: Self-pay

## 2021-06-24 VITALS — BP 140/80 | HR 95 | Temp 98.3°F | Resp 16 | Ht 66.0 in | Wt 107.6 lb

## 2021-06-24 DIAGNOSIS — J449 Chronic obstructive pulmonary disease, unspecified: Secondary | ICD-10-CM | POA: Diagnosis not present

## 2021-06-24 DIAGNOSIS — I471 Supraventricular tachycardia, unspecified: Secondary | ICD-10-CM

## 2021-06-24 DIAGNOSIS — E039 Hypothyroidism, unspecified: Secondary | ICD-10-CM

## 2021-06-24 DIAGNOSIS — I10 Essential (primary) hypertension: Secondary | ICD-10-CM | POA: Diagnosis not present

## 2021-06-24 DIAGNOSIS — Z0001 Encounter for general adult medical examination with abnormal findings: Secondary | ICD-10-CM

## 2021-06-24 DIAGNOSIS — F419 Anxiety disorder, unspecified: Secondary | ICD-10-CM

## 2021-06-24 DIAGNOSIS — I700 Atherosclerosis of aorta: Secondary | ICD-10-CM

## 2021-06-24 DIAGNOSIS — R0902 Hypoxemia: Secondary | ICD-10-CM | POA: Diagnosis not present

## 2021-06-24 DIAGNOSIS — E119 Type 2 diabetes mellitus without complications: Secondary | ICD-10-CM | POA: Diagnosis not present

## 2021-06-24 DIAGNOSIS — E782 Mixed hyperlipidemia: Secondary | ICD-10-CM

## 2021-06-24 DIAGNOSIS — E559 Vitamin D deficiency, unspecified: Secondary | ICD-10-CM

## 2021-06-24 LAB — POCT GLYCOSYLATED HEMOGLOBIN (HGB A1C): Hemoglobin A1C: 5.6 % (ref 4.0–5.6)

## 2021-06-24 MED ORDER — FLUOXETINE HCL 40 MG PO CAPS: 40.0000 mg | ORAL_CAPSULE | Freq: Every day | ORAL | 3 refills | Status: DC | PRN

## 2021-06-24 MED ORDER — LEVOTHYROXINE SODIUM 88 MCG PO TABS: 88.0000 ug | ORAL_TABLET | Freq: Every day | ORAL | 3 refills | Status: DC

## 2021-06-24 MED ORDER — TRAMADOL HCL 50 MG PO TABS: 50.0000 mg | ORAL_TABLET | Freq: Two times a day (BID) | ORAL | 2 refills | Status: DC | PRN

## 2021-06-24 NOTE — Progress Notes (Signed)
[facility] [place], [place] [address]  Internal MEDICINE  Office Visit Note  Patient Name: [name]  [id number]  [id number]  Date of Service: 06/24/2021  Chief Complaint  Patient presents with   Medicare Wellness    Wants to have portable oxygen, swelling in left thumb and right thumb   Depression   Hypertension   Anxiety   COPD    HPI [name] presents for an annual well visit and physical exam.  She is a well-appearing 76 year old female with congestive heart failure, COPD, and osteoporosis.  She is really not getting mammograms anymore.  She has finished with physical therapy and is continuing to walk and do her stretches every day.  She also has a motorized scooter to help her get around now as well.  She is also due for labs.  Her blood pressure is elevated but improved when rechecked.  Her A1c is 5.6 today which is stable and within normal limits.  She is overdue for an eye exam.  She is on 3 L of continuous oxygen and her oxygen saturation is 92%.  Her last colonoscopy was in 2019 and she also had her BMD screening in April 2019.   Current Medication: Outpatient Encounter Medications as of 06/24/2021  Medication Sig   acetaminophen (TYLENOL) 500 MG tablet Take 1,000 mg by mouth in the morning, at noon, and at bedtime.   albuterol (VENTOLIN HFA) 108 (90 Base) MCG/ACT inhaler INAHLE 2 PUFFS INTO THE LUNGS EVERY 6 HOURS AS NEEDED FOR WHEEZING OR SHORTNESS OF BREATH   alendronate (FOSAMAX) 70 MG tablet Take 1 tablet (70 mg total) by mouth every Tuesday.   aspirin EC 81 MG tablet Take 81 mg by mouth daily. Swallow whole.   Azelastine HCl 137 MCG/SPRAY SOLN Place 2 sprays into both nostrils in the morning and at bedtime.   Ergocalciferol 50 MCG (2000 UT) TABS Take 2 capsules by mouth daily.   fluticasone (FLONASE) 50 MCG/ACT nasal spray Place 2 sprays into both nostrils in the morning and at bedtime.   hydrocortisone 2.5 % cream Place 1 application  rectally 2 (two) times daily as needed (itching/hemorroids).    ipratropium-albuterol (DUONEB) 0.5-2.5 (3) MG/3ML SOLN One vial 3 x  aday for copd with hypoxia   LORazepam (ATIVAN) 1 MG tablet Take 1 tablet (1 mg total) by mouth 2 (two) times daily as needed for anxiety.   lubiprostone (AMITIZA) 8 MCG capsule Take 1 capsule (8 mcg total) by mouth daily with breakfast.   meclizine (ANTIVERT) 25 MG tablet Take 1 tablet (25 mg total) by mouth 3 (three) times daily as needed for dizziness or nausea.   mirtazapine (REMERON) 15 MG tablet Take 1 tablet (15 mg total) by mouth at bedtime.   montelukast (SINGULAIR) 10 MG tablet Take 1 tablet (10 mg total) by mouth at bedtime.   Multiple Vitamin (MULTIVITAMIN WITH MINERALS) TABS tablet Take 1 tablet by mouth daily. Multivitamin for Seniors   ondansetron (ZOFRAN) 4 MG tablet Take 1 tablet (4 mg total) by mouth every 6 (six) hours as needed for nausea.   rosuvastatin (CRESTOR) 20 MG tablet Take 1 tablet (20 mg total) by mouth daily.   SYMBICORT 160-4.5 MCG/ACT inhaler Inhale 2 puffs into the lungs 2 (two) times daily.   tiotropium (SPIRIVA HANDIHALER) 18 MCG inhalation capsule Place 1 capsule (18 mcg total) into inhaler and inhale daily.   [DISCONTINUED] FLUoxetine (PROZAC) 40 MG capsule Take 1 capsule (40 mg total) by mouth  daily as needed.   [DISCONTINUED] levothyroxine (SYNTHROID) 88 MCG tablet Take 1 tablet (88 mcg total) by mouth daily before breakfast. Take 30 minutes before food and with a full glass of water   [DISCONTINUED] traMADol (ULTRAM) 50 MG tablet Take 1 tablet (50 mg total) by mouth 2 (two) times daily as needed for moderate pain or severe pain.   FLUoxetine (PROZAC) 40 MG capsule Take 1 capsule (40 mg total) by mouth daily as needed.   levothyroxine (SYNTHROID) 88 MCG tablet Take 1 tablet (88 mcg total) by mouth daily before breakfast. Take 30 minutes before food and with a full glass of water   traMADol (ULTRAM) 50 MG tablet Take 1 tablet (50  mg total) by mouth 2 (two) times daily as needed for moderate pain or severe pain.   [DISCONTINUED] enoxaparin (LOVENOX) 40 MG/0.4ML injection Inject 0.4 mLs (40 mg total) into the skin daily. (Patient not taking: Reported on 12/14/2020)   No facility-administered encounter medications on file as of 06/24/2021.    Surgical History: Past Surgical History:  Procedure Laterality Date   ABDOMINAL HYSTERECTOMY     CAST APPLICATION  12/17/8001   Procedure: Application of long-leg hinged brace right lower extremity.;  Surgeon: Corky Mull, MD;  Location: [facility] ORS;  Service: Orthopedics;;   ESOPHAGOGASTRODUODENOSCOPY (EGD) WITH PROPOFOL N/A 01/10/2015   Procedure: ESOPHAGOGASTRODUODENOSCOPY (EGD) WITH PROPOFOL;  Surgeon: Hulen Luster, MD;  Location[facility] ENDOSCOPY;  Service: Gastroenterology;  Laterality: N/A;   ORIF ELBOW FRACTURE Left 11/19/2020   Procedure: OPEN REDUCTION INTERNAL FIXATION (ORIF) ELBOW/OLECRANON FRACTURE;  Surgeon: Corky Mull, MD;  Location: [facility] ORS;  Service: Orthopedics;  Laterality: Left;   RECTAL SURGERY     RIGHT/LEFT HEART CATH AND CORONARY ANGIOGRAPHY Bilateral 04/08/2020   Procedure: RIGHT/LEFT HEART CATH AND CORONARY ANGIOGRAPHY;  Surgeon: Wellington Hampshire, MD;  Location: [facility];  Service: Cardiovascular;  Laterality: Bilateral;   SAVORY DILATION N/A 01/10/2015   Procedure: SAVORY DILATION;  Surgeon: Hulen Luster, MD;  Location: [facility] ENDOSCOPY;  Service: Gastroenterology;  Laterality: N/A;   toenail removal Bilateral    great toes   TONSILLECTOMY AND ADENOIDECTOMY      Medical History: Past Medical History:  Diagnosis Date   Anxiety    Chest pain    CHF (congestive heart failure) (HCC)    COPD (chronic obstructive pulmonary disease) (HCC)    Depression    Diabetes mellitus without complication (HCC)    Fibromyalgia    Graves disease    Hypertension    IBS (irritable bowel syndrome)    PTSD (post-traumatic stress disorder)    Sinus problem    Spinal  stenosis     Family History: Family History  Problem Relation Age of Onset   Alcohol abuse Mother    Depression Mother    Varicose Veins Mother    Alcohol abuse Father    Depression Father    Alcohol abuse Sister    Asthma Sister    COPD Sister    Depression Sister    Heart disease Sister    Hypertension Sister    Diabetes Sister    Depression Maternal Grandmother    Diabetes Paternal Grandmother    Stroke Paternal Grandmother     Social History   Socioeconomic History   Marital status: Widowed    Spouse name: Not on file   Number of children: 2   Years of education: college   Highest education level: Not on file  Occupational History  Not on file  Tobacco Use   Smoking status: Former    Packs/day: 0.50    Years: 25.00    Pack years: 12.50    Types: Cigarettes    Quit date: 06/03/2016    Years since quitting: 5.0   Smokeless tobacco: Never  Vaping Use   Vaping Use: Never used  Substance and Sexual Activity   Alcohol use: No    Alcohol/week: 0.0 standard drinks   Drug use: No   Sexual activity: Not Currently  Other Topics Concern   Not on file  Social History Narrative   Lives by herself; 1 daughter Sharyn Lull in [place]: 1 daughter in [place]    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   Social Determinants of Health   Financial Resource Strain: Not on file  Food Insecurity: No Food Insecurity   Worried About Charity fundraiser in the Last Year: Never true   Arboriculturist in the Last Year: Never true  Transportation Needs: No Transportation Needs   Lack of Transportation (Medical): No   Lack of Transportation (Non-Medical): No  Physical Activity: Not on file  Stress: No Stress Concern Present   Feeling of Stress : Only a little  Social Connections: Not on file  Intimate Partner Violence: Not At Risk   Fear of Current or Ex-Partner: No   Emotionally Abused: No   Physically Abused: No   Sexually Abused: No      Review of  Systems  Constitutional:  Negative for activity change, appetite change, chills, fatigue, fever and unexpected weight change.  HENT: Negative.  Negative for congestion, ear pain, rhinorrhea, sore throat and trouble swallowing.   Eyes: Negative.   Respiratory: Negative.  Negative for cough, chest tightness, shortness of breath and wheezing.   Cardiovascular: Negative.  Negative for chest pain.  Gastrointestinal: Negative.  Negative for abdominal pain, blood in stool, constipation, diarrhea, nausea and vomiting.  Endocrine: Negative.   Genitourinary: Negative.  Negative for difficulty urinating, dysuria, frequency, hematuria and urgency.  Musculoskeletal: Negative.  Negative for arthralgias, back pain, joint swelling, myalgias and neck pain.  Skin: Negative.  Negative for rash and wound.  Allergic/Immunologic: Negative.  Negative for immunocompromised state.  Neurological: Negative.  Negative for dizziness, seizures, numbness and headaches.  Hematological: Negative.   Psychiatric/Behavioral:  Positive for depression. Negative for behavioral problems, self-injury and suicidal ideas. The patient is not nervous/anxious.    Vital Signs: BP (!) 170/70    Pulse 95    Temp 98.3 F (36.8 C)    Resp 16    Ht _0  (1.676 m)    Wt 107 lb 9.6 oz (48.8 kg)    SpO2 92% Comment: with 3L oxygen   BMI 17.37 kg/m    Physical Exam Vitals reviewed.  Constitutional:      General: She is awake. She is not in acute distress.    Appearance: Normal appearance. She is well-developed, well-groomed and normal weight. She is not ill-appearing or diaphoretic.  HENT:     Head: Normocephalic and atraumatic.     Right Ear: Tympanic membrane, ear canal and external ear normal.     Left Ear: Tympanic membrane, ear canal and external ear normal.     Nose: Nose normal. No congestion or rhinorrhea.     Mouth/Throat:     Lips: Pink.     Mouth: Mucous membranes are moist.     Pharynx: Oropharynx is clear. Uvula midline.  No oropharyngeal exudate or  posterior oropharyngeal erythema.  Eyes:     General: Lids are normal. Vision grossly intact. Gaze aligned appropriately. No scleral icterus.       Right eye: No discharge.        Left eye: No discharge.     Extraocular Movements: Extraocular movements intact.     Conjunctiva/sclera: Conjunctivae normal.     Pupils: Pupils are equal, round, and reactive to light.     Funduscopic exam:    Right eye: Red reflex present.        Left eye: Red reflex present. Neck:     Thyroid: No thyromegaly.     Vascular: No JVD.     Trachea: Trachea and phonation normal. No tracheal deviation.  Cardiovascular:     Rate and Rhythm: Normal rate and regular rhythm.     Pulses: Normal pulses.     Heart sounds: Normal heart sounds, S1 normal and S2 normal. No murmur heard.   No friction rub. No gallop.  Pulmonary:     Effort: Pulmonary effort is normal. No accessory muscle usage or respiratory distress.     Breath sounds: Normal breath sounds and air entry. No stridor. No wheezing or rales.  Chest:     Chest wall: No tenderness.     Comments: Declined clinical breast exam.  Abdominal:     General: Bowel sounds are normal. There is no distension.     Palpations: Abdomen is soft. There is no shifting dullness, fluid wave, mass or pulsatile mass.     Tenderness: There is no abdominal tenderness. There is no guarding or rebound.  Musculoskeletal:        General: No tenderness or deformity. Normal range of motion.     Cervical back: Normal range of motion and neck supple.     Right lower leg: No edema.     Left lower leg: No edema.  Lymphadenopathy:     Cervical: No cervical adenopathy.  Skin:    General: Skin is warm and dry.     Capillary Refill: Capillary refill takes less than 2 seconds.     Coloration: Skin is not pale.     Findings: No erythema or rash.  Neurological:     Mental Status: She is alert and oriented to person, place, and time.     Cranial Nerves: No  cranial nerve deficit.     Motor: No abnormal muscle tone.     Coordination: Coordination normal.     Deep Tendon Reflexes: Reflexes are normal and symmetric.  Psychiatric:        Mood and Affect: Mood and affect normal.        Behavior: Behavior is cooperative.        Thought Content: Thought content normal.        Judgment: Judgment normal.       Assessment/Plan: 1. Encounter for general adult medical examination with abnormal findings Age-appropriate preventive screenings and vaccinations discussed, annual physical exam completed. Routine labs for health maintenance ordered, see below. PHM updated.  - traMADol (ULTRAM) 50 MG tablet; Take 1 tablet (50 mg total) by mouth 2 (two) times daily as needed for moderate pain or severe pain.  Dispense: 60 tablet; Refill: 2  2. COPD with hypoxia ([place]) Portable continuous ome oxygen reordered. Routine lab ordered.  - For home use only DME oxygen - CBC with Differential/Platelet  3. Diet-controlled type 2 diabetes mellitus (HCC) A1C is stable and wnl. Labs ordered. No change to treatment regimen - POCT  glycosylated hemoglobin (Hb A1C) - Urine Microalbumin w/creat. ratio - CBC with Differential/Platelet - CMP14+EGFR  4. Essential hypertension Blood pressure is stable, routine lab ordered.  - CBC with Differential/Platelet  5. PSVT (paroxysmal supraventricular tachycardia) ([place]) Routine labs ordered.  - CBC with Differential/Platelet  6. Aortic atherosclerosis ([place]) Routine labs ordered. Continue rosuvastatin.  - CBC with Differential/Platelet  7. Hypothyroidism, acquired Labs ordered. Levothyroxine refills ordered.  - levothyroxine (SYNTHROID) 88 MCG tablet; Take 1 tablet (88 mcg total) by mouth daily before breakfast. Take 30 minutes before food and with a full glass of water  Dispense: 90 tablet; Refill: 3 - CBC with Differential/Platelet - TSH + free T4  8. Hyperlipidemia, mixed Routine labs ordered - CBC with  Differential/Platelet - Lipid Profile  9. Vitamin D deficiency Routine labs ordered.  - CBC with Differential/Platelet - Vitamin D (25 hydroxy)  10. Anxiety Stable, fluoxetine refills ordered.  - FLUoxetine (PROZAC) 40 MG capsule; Take 1 capsule (40 mg total) by mouth daily as needed.  Dispense: 90 capsule; Refill: 3       General Counseling: [name] verbalizes understanding of the findings of todays visit and agrees with plan of treatment. I have discussed any further diagnostic evaluation that may be needed or ordered today. We also reviewed her medications today. she has been encouraged to call the office with any questions or concerns that should arise related to todays visit.    Orders Placed This Encounter  Procedures   For home use only DME oxygen   Urine Microalbumin w/creat. ratio   CBC with Differential/Platelet   Vitamin D (25 hydroxy)   Lipid Profile   CMP14+EGFR   TSH + free T4   POCT glycosylated hemoglobin (Hb A1C)    Meds ordered this encounter  Medications   traMADol (ULTRAM) 50 MG tablet    Sig: Take 1 tablet (50 mg total) by mouth 2 (two) times daily as needed for moderate pain or severe pain.    Dispense:  60 tablet    Refill:  2   FLUoxetine (PROZAC) 40 MG capsule    Sig: Take 1 capsule (40 mg total) by mouth daily as needed.    Dispense:  90 capsule    Refill:  3   levothyroxine (SYNTHROID) 88 MCG tablet    Sig: Take 1 tablet (88 mcg total) by mouth daily before breakfast. Take 30 minutes before food and with a full glass of water    Dispense:  90 tablet    Refill:  3    Return in about 4 months (around 10/22/2021) for F/U, Aloise Copus PCP.   Total time spent:30 Minutes Time spent includes review of chart, medications, test results, and follow up plan with the patient.   [place] Controlled Substance Database was reviewed by me.  This patient was seen by Jonetta Osgood, FNP-C in collaboration with Dr. Clayborn Bigness as a part of collaborative care  agreement.  Tijah Hane R. Valetta Fuller, MSN, FNP-C Internal medicine

## 2021-06-25 ENCOUNTER — Telehealth: Payer: Self-pay

## 2021-06-25 NOTE — Telephone Encounter (Signed)
Spoke to Perlie Mayo from [facility] [facility], she will work on oxygen order.

## 2021-06-29 DIAGNOSIS — R0602 Shortness of breath: Secondary | ICD-10-CM | POA: Diagnosis not present

## 2021-07-04 DIAGNOSIS — J449 Chronic obstructive pulmonary disease, unspecified: Secondary | ICD-10-CM | POA: Diagnosis not present

## 2021-07-12 DIAGNOSIS — S82009D Unspecified fracture of unspecified patella, subsequent encounter for closed fracture with routine healing: Secondary | ICD-10-CM | POA: Diagnosis not present

## 2021-07-12 DIAGNOSIS — M25561 Pain in right knee: Secondary | ICD-10-CM | POA: Diagnosis not present

## 2021-07-12 DIAGNOSIS — S42402D Unspecified fracture of lower end of left humerus, subsequent encounter for fracture with routine healing: Secondary | ICD-10-CM | POA: Diagnosis not present

## 2021-07-14 ENCOUNTER — Telehealth: Payer: Self-pay

## 2021-07-14 NOTE — Telephone Encounter (Signed)
Unable to [facility] with ot, LMOM with pt's daughter. Pt needs to call [facility] regarding her oxygen order, they have reached  out but were not able to speak with pt. Seraphim can call [phone]

## 2021-07-20 ENCOUNTER — Encounter: Payer: Self-pay | Admitting: Nurse Practitioner

## 2021-07-27 DIAGNOSIS — R0602 Shortness of breath: Secondary | ICD-10-CM | POA: Diagnosis not present

## 2021-08-09 DIAGNOSIS — S42402D Unspecified fracture of lower end of left humerus, subsequent encounter for fracture with routine healing: Secondary | ICD-10-CM | POA: Diagnosis not present

## 2021-08-09 DIAGNOSIS — M25561 Pain in right knee: Secondary | ICD-10-CM | POA: Diagnosis not present

## 2021-08-09 DIAGNOSIS — S82009D Unspecified fracture of unspecified patella, subsequent encounter for closed fracture with routine healing: Secondary | ICD-10-CM | POA: Diagnosis not present

## 2021-08-25 ENCOUNTER — Other Ambulatory Visit: Payer: Self-pay | Admitting: Internal Medicine

## 2021-08-25 DIAGNOSIS — J449 Chronic obstructive pulmonary disease, unspecified: Secondary | ICD-10-CM

## 2021-08-27 DIAGNOSIS — R0602 Shortness of breath: Secondary | ICD-10-CM | POA: Diagnosis not present

## 2021-09-09 DIAGNOSIS — S82009D Unspecified fracture of unspecified patella, subsequent encounter for closed fracture with routine healing: Secondary | ICD-10-CM | POA: Diagnosis not present

## 2021-09-09 DIAGNOSIS — M25561 Pain in right knee: Secondary | ICD-10-CM | POA: Diagnosis not present

## 2021-09-09 DIAGNOSIS — S42402D Unspecified fracture of lower end of left humerus, subsequent encounter for fracture with routine healing: Secondary | ICD-10-CM | POA: Diagnosis not present

## 2021-09-23 ENCOUNTER — Ambulatory Visit: Payer: Medicare Other | Admitting: Nurse Practitioner

## 2021-09-26 DIAGNOSIS — R0602 Shortness of breath: Secondary | ICD-10-CM | POA: Diagnosis not present

## 2021-10-06 ENCOUNTER — Encounter: Payer: Self-pay | Admitting: Physician Assistant

## 2021-10-06 ENCOUNTER — Telehealth: Payer: Self-pay

## 2021-10-06 ENCOUNTER — Ambulatory Visit (INDEPENDENT_AMBULATORY_CARE_PROVIDER_SITE_OTHER): Payer: Medicare Other | Admitting: Physician Assistant

## 2021-10-06 ENCOUNTER — Ambulatory Visit: Payer: Medicare Other | Admitting: Nurse Practitioner

## 2021-10-06 DIAGNOSIS — R0902 Hypoxemia: Secondary | ICD-10-CM

## 2021-10-06 DIAGNOSIS — E039 Hypothyroidism, unspecified: Secondary | ICD-10-CM | POA: Diagnosis not present

## 2021-10-06 DIAGNOSIS — J449 Chronic obstructive pulmonary disease, unspecified: Secondary | ICD-10-CM | POA: Diagnosis not present

## 2021-10-06 DIAGNOSIS — J440 Chronic obstructive pulmonary disease with acute lower respiratory infection: Secondary | ICD-10-CM | POA: Diagnosis not present

## 2021-10-06 DIAGNOSIS — E119 Type 2 diabetes mellitus without complications: Secondary | ICD-10-CM | POA: Diagnosis not present

## 2021-10-06 DIAGNOSIS — F5101 Primary insomnia: Secondary | ICD-10-CM | POA: Diagnosis not present

## 2021-10-06 DIAGNOSIS — Z0001 Encounter for general adult medical examination with abnormal findings: Secondary | ICD-10-CM

## 2021-10-06 DIAGNOSIS — J209 Acute bronchitis, unspecified: Secondary | ICD-10-CM

## 2021-10-06 DIAGNOSIS — M159 Polyosteoarthritis, unspecified: Secondary | ICD-10-CM | POA: Diagnosis not present

## 2021-10-06 LAB — POCT GLYCOSYLATED HEMOGLOBIN (HGB A1C): Hemoglobin A1C: 5.6 % (ref 4.0–5.6)

## 2021-10-06 MED ORDER — LORAZEPAM 1 MG PO TABS: 1.0000 mg | ORAL_TABLET | Freq: Two times a day (BID) | ORAL | 2 refills | Status: DC | PRN

## 2021-10-06 MED ORDER — LEVOTHYROXINE SODIUM 88 MCG PO TABS: 88.0000 ug | ORAL_TABLET | Freq: Every day | ORAL | 3 refills | Status: DC

## 2021-10-06 MED ORDER — AZITHROMYCIN 250 MG PO TABS: ORAL_TABLET | ORAL | 0 refills | Status: DC

## 2021-10-06 MED ORDER — PREDNISONE 10 MG PO TABS: ORAL_TABLET | ORAL | 0 refills | Status: DC

## 2021-10-06 MED ORDER — METHYLPREDNISOLONE ACETATE 80 MG/ML IJ SUSP
80.0000 mg | Freq: Once | INTRAMUSCULAR | Status: AC
  Administered 2021-10-06: 40 mg via INTRAMUSCULAR

## 2021-10-06 MED ORDER — TRAMADOL HCL 50 MG PO TABS: 50.0000 mg | ORAL_TABLET | Freq: Two times a day (BID) | ORAL | 2 refills | Status: DC | PRN

## 2021-10-06 NOTE — Progress Notes (Unsigned)
[facility] [address] [place], [place] [address]  Internal MEDICINE  Office Visit Note  Patient Name: [name]  [id number]  [id number]  Date of Service: 10/07/2021  Chief Complaint  Patient presents with   Follow-up   Depression   Diabetes   Hypertension   Shortness of Breath    HPI Pt is here for routine follow up as well as sick visit -Feeling more SOB today, the past few days has not felt well. Worried about COPD exacerbation. -Dry cough every now and then, some aching, no fevers or chills, no sick contacts -Oxygen at home 97-98%, wears 2L continuously and has felt SOB on this recently. -Using inhalers, but no neb treatments, using her nasal sprays -She uses [facility] for her oxygen and is in need of a portable oxygen. Patient was found to desat on room air. -BP has been stable -has renewal paperwork for continued aid. She requires assistance with mobility and ADLs. Also benefits from assistance with medications.   Current Medication: Outpatient Encounter Medications as of 10/06/2021  Medication Sig   acetaminophen (TYLENOL) 500 MG tablet Take 1,000 mg by mouth in the morning, at noon, and at bedtime.   albuterol (VENTOLIN HFA) 108 (90 Base) MCG/ACT inhaler INAHLE 2 PUFFS INTO THE LUNGS EVERY 6 HOURS AS NEEDED FOR WHEEZING OR SHORTNESS OF BREATH   alendronate (FOSAMAX) 70 MG tablet Take 1 tablet (70 mg total) by mouth every Tuesday.   aspirin EC 81 MG tablet Take 81 mg by mouth daily. Swallow whole.   Azelastine HCl 137 MCG/SPRAY SOLN Place 2 sprays into both nostrils in the morning and at bedtime.   azithromycin (ZITHROMAX) 250 MG tablet Take one tab a day for 10 days for uri   Ergocalciferol 50 MCG (2000 UT) TABS Take 2 capsules by mouth daily.   FLUoxetine (PROZAC) 40 MG capsule Take 1 capsule (40 mg total) by mouth daily as needed.   fluticasone (FLONASE) 50 MCG/ACT nasal spray Place 2 sprays into both nostrils in the morning and at  bedtime.   hydrocortisone 2.5 % cream Place 1 application rectally 2 (two) times daily as needed (itching/hemorroids).    ipratropium-albuterol (DUONEB) 0.5-2.5 (3) MG/3ML SOLN USE 1 VIAL IN NEBULIZER 3 TIMES DAILY   lubiprostone (AMITIZA) 8 MCG capsule Take 1 capsule (8 mcg total) by mouth daily with breakfast.   meclizine (ANTIVERT) 25 MG tablet Take 1 tablet (25 mg total) by mouth 3 (three) times daily as needed for dizziness or nausea.   mirtazapine (REMERON) 15 MG tablet Take 1 tablet (15 mg total) by mouth at bedtime.   montelukast (SINGULAIR) 10 MG tablet Take 1 tablet (10 mg total) by mouth at bedtime.   Multiple Vitamin (MULTIVITAMIN WITH MINERALS) TABS tablet Take 1 tablet by mouth daily. Multivitamin for Seniors   ondansetron (ZOFRAN) 4 MG tablet Take 1 tablet (4 mg total) by mouth every 6 (six) hours as needed for nausea.   OXYGEN Inhale into the lungs. Pt is on 2 liters oxygen 24 hrs uses [facility]   predniSONE (DELTASONE) 10 MG tablet Take one tab 3 x day for 3 days, then take one tab 2 x a day for 3 days and then take one tab a day for 3 days for copd   rosuvastatin (CRESTOR) 20 MG tablet Take 1 tablet (20 mg total) by mouth daily.   SYMBICORT 160-4.5 MCG/ACT inhaler Inhale 2 puffs into the lungs 2 (two) times daily.   tiotropium (  SPIRIVA HANDIHALER) 18 MCG inhalation capsule Place 1 capsule (18 mcg total) into inhaler and inhale daily.   [DISCONTINUED] levothyroxine (SYNTHROID) 88 MCG tablet Take 1 tablet (88 mcg total) by mouth daily before breakfast. Take 30 minutes before food and with a full glass of water   [DISCONTINUED] LORazepam (ATIVAN) 1 MG tablet Take 1 tablet (1 mg total) by mouth 2 (two) times daily as needed for anxiety.   [DISCONTINUED] traMADol (ULTRAM) 50 MG tablet Take 1 tablet (50 mg total) by mouth 2 (two) times daily as needed for moderate pain or severe pain.   levothyroxine (SYNTHROID) 88 MCG tablet Take 1 tablet (88 mcg total) by mouth daily before  breakfast. Take 30 minutes before food and with a full glass of water   LORazepam (ATIVAN) 1 MG tablet Take 1 tablet (1 mg total) by mouth 2 (two) times daily as needed for anxiety.   traMADol (ULTRAM) 50 MG tablet Take 1 tablet (50 mg total) by mouth 2 (two) times daily as needed for moderate pain or severe pain.   [DISCONTINUED] enoxaparin (LOVENOX) 40 MG/0.4ML injection Inject 0.4 mLs (40 mg total) into the skin daily. (Patient not taking: Reported on 12/14/2020)   [EXPIRED] methylPREDNISolone acetate (DEPO-MEDROL) injection 80 mg    No facility-administered encounter medications on file as of 10/06/2021.    Surgical History: Past Surgical History:  Procedure Laterality Date   ABDOMINAL HYSTERECTOMY     CAST APPLICATION  11/19/2020   Procedure: Application of long-leg hinged brace right lower extremity.;  Surgeon: Christena Flake, MD;  Location: [facility] ORS;  Service: Orthopedics;;   ESOPHAGOGASTRODUODENOSCOPY (EGD) WITH PROPOFOL N/A 01/10/2015   Procedure: ESOPHAGOGASTRODUODENOSCOPY (EGD) WITH PROPOFOL;  Surgeon: Wallace Cullens, MD;  Location: [facility] ENDOSCOPY;  Service: Gastroenterology;  Laterality: N/A;   ORIF ELBOW FRACTURE Left 11/19/2020   Procedure: OPEN REDUCTION INTERNAL FIXATION (ORIF) ELBOW/OLECRANON FRACTURE;  Surgeon: Christena Flake, MD;  Location: [facility] ORS;  Service: Orthopedics;  Laterality: Left;   RECTAL SURGERY     RIGHT/LEFT HEART CATH AND CORONARY ANGIOGRAPHY Bilateral 04/08/2020   Procedure: RIGHT/LEFT HEART CATH AND CORONARY ANGIOGRAPHY;  Surgeon: Iran Ouch, MD;  Location: [facility] INVASIVE CV LAB;  Service: Cardiovascular;  Laterality: Bilateral;   SAVORY DILATION N/A 01/10/2015   Procedure: SAVORY DILATION;  Surgeon: Wallace Cullens, MD;  Location: [facility] ENDOSCOPY;  Service: Gastroenterology;  Laterality: N/A;   toenail removal Bilateral    great toes   TONSILLECTOMY AND ADENOIDECTOMY      Medical History: Past Medical History:  Diagnosis Date   Anxiety    Chest pain    CHF  (congestive heart failure) (HCC)    COPD (chronic obstructive pulmonary disease) (HCC)    Depression    Diabetes mellitus without complication (HCC)    Fibromyalgia    Graves disease    Hypertension    IBS (irritable bowel syndrome)    PTSD (post-traumatic stress disorder)    Sinus problem    Spinal stenosis     Family History: Family History  Problem Relation Age of Onset   Alcohol abuse Mother    Depression Mother    Varicose Veins Mother    Alcohol abuse Father    Depression Father    Alcohol abuse Sister    Asthma Sister    COPD Sister    Depression Sister    Heart disease Sister    Hypertension Sister    Diabetes Sister    Depression Maternal Grandmother    Diabetes Paternal  Grandmother    Stroke Paternal Grandmother     Social History   Socioeconomic History   Marital status: Widowed    Spouse name: Not on file   Number of children: 2   Years of education: college   Highest education level: Not on file  Occupational History   Not on file  Tobacco Use   Smoking status: Former    Packs/day: 0.50    Years: 25.00    Pack years: 12.50    Types: Cigarettes    Quit date: 06/03/2016    Years since quitting: 5.3   Smokeless tobacco: Never  Vaping Use   Vaping Use: Never used  Substance and Sexual Activity   Alcohol use: No    Alcohol/week: 0.0 standard drinks   Drug use: No   Sexual activity: Not Currently  Other Topics Concern   Not on file  Social History Narrative   Lives by herself; 1 daughter Marcelino Duster in [place]: 1 daughter in [place]    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   Social Determinants of Health   Financial Resource Strain: Not on file  Food Insecurity: No Food Insecurity   Worried About Programme researcher, broadcasting/film/video in the Last Year: Never true   Barista in the Last Year: Never true  Transportation Needs: No Transportation Needs   Lack of Transportation (Medical): No   Lack of Transportation (Non-Medical): No   Physical Activity: Not on file  Stress: No Stress Concern Present   Feeling of Stress : Only a little  Social Connections: Not on file  Intimate Partner Violence: Not At Risk   Fear of Current or Ex-Partner: No   Emotionally Abused: No   Physically Abused: No   Sexually Abused: No      Review of Systems  Constitutional:  Negative for chills, fatigue and unexpected weight change.  HENT:  Negative for congestion, rhinorrhea, sneezing and sore throat.   Eyes:  Negative for redness.  Respiratory:  Positive for cough, shortness of breath and wheezing. Negative for chest tightness.   Cardiovascular:  Negative for chest pain and palpitations.  Gastrointestinal:  Negative for abdominal pain, constipation, diarrhea, nausea and vomiting.  Genitourinary:  Negative for dysuria and frequency.  Musculoskeletal:  Positive for gait problem. Negative for arthralgias, back pain, joint swelling and neck pain.  Skin:  Negative for rash.  Neurological:  Negative for tremors and numbness.  Hematological:  Negative for adenopathy. Does not bruise/bleed easily.  Psychiatric/Behavioral:  Negative for behavioral problems (Depression), sleep disturbance and suicidal ideas. The patient is not nervous/anxious.    Vital Signs: BP 122/64   Pulse 98   Temp 97.6 F (36.4 C)   Resp 16   Ht  (1.676 m)   Wt 121 lb 9.6 oz (55.2 kg)   SpO2 (!) 88% Comment: room air.  BMI 19.63 kg/m    Physical Exam Vitals and nursing note reviewed.  Constitutional:      General: She is not in acute distress.    Appearance: She is well-developed. She is not diaphoretic.  HENT:     Head: Normocephalic and atraumatic.     Mouth/Throat:     Pharynx: No oropharyngeal exudate.  Eyes:     Pupils: Pupils are equal, round, and reactive to light.  Neck:     Thyroid: No thyromegaly.     Vascular: No JVD.     Trachea: No tracheal deviation.  Cardiovascular:     Rate and  Rhythm: Normal rate and regular rhythm.     Heart  sounds: Normal heart sounds. No murmur heard.   No friction rub. No gallop.  Pulmonary:     Effort: Pulmonary effort is normal. No respiratory distress.     Breath sounds: Wheezing present. No rales.  Chest:     Chest wall: No tenderness.  Abdominal:     General: Bowel sounds are normal.     Palpations: Abdomen is soft.  Musculoskeletal:        General: Normal range of motion.     Cervical back: Normal range of motion and neck supple.  Lymphadenopathy:     Cervical: No cervical adenopathy.  Skin:    General: Skin is warm and dry.  Neurological:     Mental Status: She is alert and oriented to person, place, and time.     Cranial Nerves: No cranial nerve deficit.     Gait: Gait abnormal.  Psychiatric:        Behavior: Behavior normal.        Thought Content: Thought content normal.        Judgment: Judgment normal.       Assessment/Plan: 1. Acute bronchitis with COPD (HCC) Given  depo-medrol in office and will start oral prednisone taper along with zpak. Continue oxygen as before and monitor symptoms. Contact office if not improving - azithromycin (ZITHROMAX) 250 MG tablet; Take one tab a day for 10 days for uri  Dispense: 10 tablet; Refill: 0 - predniSONE (DELTASONE) 10 MG tablet; Take one tab 3 x day for 3 days, then take one tab 2 x a day for 3 days and then take one tab a day for 3 days for copd  Dispense: 18 tablet; Refill: 0 - methylPREDNISolone acetate (DEPO-MEDROL) injection 80 mg - For home use only DME oxygen  2. COPD with hypoxia (HCC) Continue inhalers as prescribed. Continue oxygen as before and will order portable oxygen - azithromycin (ZITHROMAX) 250 MG tablet; Take one tab a day for 10 days for uri  Dispense: 10 tablet; Refill: 0 - predniSONE (DELTASONE) 10 MG tablet; Take one tab 3 x day for 3 days, then take one tab 2 x a day for 3 days and then take one tab a day for 3 days for copd  Dispense: 18 tablet; Refill: 0 - methylPREDNISolone acetate  (DEPO-MEDROL) injection 80 mg - For home use only DME oxygen  3. Diet-controlled type 2 diabetes mellitus (HCC) - POCT HgB A1C is controlled at 5.6 putting her normal range. Continue to work on diet and exercise  4. Primary insomnia May continue ativan as before as needed - LORazepam (ATIVAN) 1 MG tablet; Take 1 tablet (1 mg total) by mouth 2 (two) times daily as needed for anxiety.  Dispense: 60 tablet; Refill: 2  5. Hypothyroidism, acquired - levothyroxine (SYNTHROID) 88 MCG tablet; Take 1 tablet (88 mcg total) by mouth daily before breakfast. Take 30 minutes before food and with a full glass of water  Dispense: 90 tablet; Refill: 3  6. Primary osteoarthritis involving multiple joints - traMADol (ULTRAM) 50 MG tablet; Take 1 tablet (50 mg total) by mouth 2 (two) times daily as needed for moderate pain or severe pain.  Dispense: 60 tablet; Refill: 2   General Counseling: [name] verbalizes understanding of the findings of todays visit and agrees with plan of treatment. I have discussed any further diagnostic evaluation that may be needed or ordered today. We also reviewed her medications today. she has  been encouraged to call the office with any questions or concerns that should arise related to todays visit.    Orders Placed This Encounter  Procedures   For home use only DME oxygen   POCT HgB A1C    Meds ordered this encounter  Medications   azithromycin (ZITHROMAX) 250 MG tablet    Sig: Take one tab a day for 10 days for uri    Dispense:  10 tablet    Refill:  0   predniSONE (DELTASONE) 10 MG tablet    Sig: Take one tab 3 x day for 3 days, then take one tab 2 x a day for 3 days and then take one tab a day for 3 days for copd    Dispense:  18 tablet    Refill:  0   methylPREDNISolone acetate (DEPO-MEDROL) injection 80 mg   traMADol (ULTRAM) 50 MG tablet    Sig: Take 1 tablet (50 mg total) by mouth 2 (two) times daily as needed for moderate pain or severe pain.    Dispense:  60  tablet    Refill:  2   LORazepam (ATIVAN) 1 MG tablet    Sig: Take 1 tablet (1 mg total) by mouth 2 (two) times daily as needed for anxiety.    Dispense:  60 tablet    Refill:  2    Note change in frequency, discontinue lorazepam daily at bedtime   levothyroxine (SYNTHROID) 88 MCG tablet    Sig: Take 1 tablet (88 mcg total) by mouth daily before breakfast. Take 30 minutes before food and with a full glass of water    Dispense:  90 tablet    Refill:  3    This patient was seen by Lynn Ito, PA-C in collaboration with Dr. Beverely Risen as a part of collaborative care agreement.   Total time spent:40 Minutes Time spent includes review of chart, medications, test results, and follow up plan with the patient.      Dr Lyndon Code Internal medicine

## 2021-10-06 NOTE — Telephone Encounter (Signed)
Sent Community message to Maralyn Sago w/ [facility] for order for [employer] portable oxygen devise 10/06/21 @ 11 am

## 2021-10-08 ENCOUNTER — Telehealth: Payer: Self-pay

## 2021-10-08 NOTE — Telephone Encounter (Signed)
Try to call pt no voicemail  

## 2021-10-10 ENCOUNTER — Inpatient Hospital Stay
Admission: EM | Admit: 2021-10-10 | Discharge: 2021-10-15 | DRG: 189 | Disposition: A | Discharge status: Home Health | Payer: Medicare Other | Attending: Internal Medicine | Admitting: Internal Medicine

## 2021-10-10 ENCOUNTER — Other Ambulatory Visit: Payer: Self-pay

## 2021-10-10 ENCOUNTER — Emergency Department: Payer: Medicare Other

## 2021-10-10 DIAGNOSIS — F4310 Post-traumatic stress disorder, unspecified: Secondary | ICD-10-CM | POA: Diagnosis present

## 2021-10-10 DIAGNOSIS — R0902 Hypoxemia: Secondary | ICD-10-CM | POA: Diagnosis not present

## 2021-10-10 DIAGNOSIS — Z9981 Dependence on supplemental oxygen: Secondary | ICD-10-CM | POA: Diagnosis not present

## 2021-10-10 DIAGNOSIS — J9601 Acute respiratory failure with hypoxia: Secondary | ICD-10-CM | POA: Diagnosis not present

## 2021-10-10 DIAGNOSIS — M797 Fibromyalgia: Secondary | ICD-10-CM | POA: Diagnosis not present

## 2021-10-10 DIAGNOSIS — Z20822 Contact with and (suspected) exposure to covid-19: Secondary | ICD-10-CM | POA: Diagnosis not present

## 2021-10-10 DIAGNOSIS — Z7983 Long term (current) use of bisphosphonates: Secondary | ICD-10-CM | POA: Diagnosis not present

## 2021-10-10 DIAGNOSIS — E039 Hypothyroidism, unspecified: Secondary | ICD-10-CM | POA: Diagnosis not present

## 2021-10-10 DIAGNOSIS — N760 Acute vaginitis: Secondary | ICD-10-CM | POA: Diagnosis not present

## 2021-10-10 DIAGNOSIS — R0689 Other abnormalities of breathing: Secondary | ICD-10-CM | POA: Diagnosis not present

## 2021-10-10 DIAGNOSIS — E119 Type 2 diabetes mellitus without complications: Secondary | ICD-10-CM

## 2021-10-10 DIAGNOSIS — R0602 Shortness of breath: Secondary | ICD-10-CM | POA: Diagnosis not present

## 2021-10-10 DIAGNOSIS — I110 Hypertensive heart disease with heart failure: Secondary | ICD-10-CM | POA: Diagnosis not present

## 2021-10-10 DIAGNOSIS — J9622 Acute and chronic respiratory failure with hypercapnia: Principal | ICD-10-CM | POA: Diagnosis present

## 2021-10-10 DIAGNOSIS — J9811 Atelectasis: Secondary | ICD-10-CM | POA: Diagnosis not present

## 2021-10-10 DIAGNOSIS — F419 Anxiety disorder, unspecified: Secondary | ICD-10-CM | POA: Diagnosis present

## 2021-10-10 DIAGNOSIS — Z90710 Acquired absence of both cervix and uterus: Secondary | ICD-10-CM | POA: Diagnosis not present

## 2021-10-10 DIAGNOSIS — J9602 Acute respiratory failure with hypercapnia: Principal | ICD-10-CM

## 2021-10-10 DIAGNOSIS — F32A Depression, unspecified: Secondary | ICD-10-CM | POA: Diagnosis present

## 2021-10-10 DIAGNOSIS — J9690 Respiratory failure, unspecified, unspecified whether with hypoxia or hypercapnia: Secondary | ICD-10-CM | POA: Diagnosis not present

## 2021-10-10 DIAGNOSIS — I5032 Chronic diastolic (congestive) heart failure: Secondary | ICD-10-CM | POA: Diagnosis not present

## 2021-10-10 DIAGNOSIS — Z87891 Personal history of nicotine dependence: Secondary | ICD-10-CM | POA: Diagnosis not present

## 2021-10-10 DIAGNOSIS — Z7982 Long term (current) use of aspirin: Secondary | ICD-10-CM

## 2021-10-10 DIAGNOSIS — M1990 Unspecified osteoarthritis, unspecified site: Secondary | ICD-10-CM | POA: Diagnosis present

## 2021-10-10 DIAGNOSIS — Z79899 Other long term (current) drug therapy: Secondary | ICD-10-CM | POA: Diagnosis not present

## 2021-10-10 DIAGNOSIS — R069 Unspecified abnormalities of breathing: Secondary | ICD-10-CM | POA: Diagnosis not present

## 2021-10-10 DIAGNOSIS — I509 Heart failure, unspecified: Secondary | ICD-10-CM | POA: Diagnosis not present

## 2021-10-10 DIAGNOSIS — J9621 Acute and chronic respiratory failure with hypoxia: Secondary | ICD-10-CM | POA: Diagnosis not present

## 2021-10-10 DIAGNOSIS — J441 Chronic obstructive pulmonary disease with (acute) exacerbation: Secondary | ICD-10-CM | POA: Diagnosis present

## 2021-10-10 DIAGNOSIS — Z7951 Long term (current) use of inhaled steroids: Secondary | ICD-10-CM

## 2021-10-10 DIAGNOSIS — K589 Irritable bowel syndrome without diarrhea: Secondary | ICD-10-CM | POA: Diagnosis present

## 2021-10-10 DIAGNOSIS — T3695XA Adverse effect of unspecified systemic antibiotic, initial encounter: Secondary | ICD-10-CM | POA: Diagnosis not present

## 2021-10-10 DIAGNOSIS — R062 Wheezing: Secondary | ICD-10-CM | POA: Diagnosis not present

## 2021-10-10 DIAGNOSIS — K219 Gastro-esophageal reflux disease without esophagitis: Secondary | ICD-10-CM | POA: Diagnosis not present

## 2021-10-10 DIAGNOSIS — Z79890 Hormone replacement therapy (postmenopausal): Secondary | ICD-10-CM

## 2021-10-10 DIAGNOSIS — Z79891 Long term (current) use of opiate analgesic: Secondary | ICD-10-CM

## 2021-10-10 DIAGNOSIS — Z743 Need for continuous supervision: Secondary | ICD-10-CM | POA: Diagnosis not present

## 2021-10-10 DIAGNOSIS — J449 Chronic obstructive pulmonary disease, unspecified: Secondary | ICD-10-CM | POA: Diagnosis not present

## 2021-10-10 DIAGNOSIS — Z888 Allergy status to other drugs, medicaments and biological substances status: Secondary | ICD-10-CM

## 2021-10-10 DIAGNOSIS — F99 Mental disorder, not otherwise specified: Secondary | ICD-10-CM

## 2021-10-10 LAB — BLOOD GAS, VENOUS
Acid-Base Excess: 4 mmol/L — ABNORMAL HIGH (ref 0.0–2.0)
Acid-Base Excess: 3.4 mmol/L — ABNORMAL HIGH (ref 0.0–2.0)
Acid-Base Excess: 6 mmol/L — ABNORMAL HIGH (ref 0.0–2.0)
Acid-Base Excess: 8.4 mmol/L — ABNORMAL HIGH (ref 0.0–2.0)
Bicarbonate: 37.9 mmol/L — ABNORMAL HIGH (ref 20.0–28.0)
Bicarbonate: 36.9 mmol/L — ABNORMAL HIGH (ref 20.0–28.0)
Bicarbonate: 39 mmol/L — ABNORMAL HIGH (ref 20.0–28.0)
Bicarbonate: 41.1 mmol/L — ABNORMAL HIGH (ref 20.0–28.0)
Delivery systems: POSITIVE
Delivery systems: POSITIVE
Delivery systems: POSITIVE
FIO2: 35 %
FIO2: 40 %
FIO2: 40 %
MECHVT: 450 mL
MECHVT: 450 mL
O2 Saturation: 65.6 %
O2 Saturation: 59.2 %
O2 Saturation: 71.4 %
O2 Saturation: 67.9 %
PEEP: 8 cmH2O
Patient temperature: 37
Patient temperature: 37
Patient temperature: 37
Patient temperature: 37
pCO2, Ven: 122 mmHg (ref 44–60)
pCO2, Ven: 116 mmHg (ref 44–60)
pCO2, Ven: 112 mmHg (ref 44–60)
pCO2, Ven: 110 mmHg (ref 44–60)
pH, Ven: 7.1 — CL (ref 7.25–7.43)
pH, Ven: 7.11 — CL (ref 7.25–7.43)
pH, Ven: 7.15 — CL (ref 7.25–7.43)
pH, Ven: 7.18 — CL (ref 7.25–7.43)
pO2, Ven: 39 mmHg (ref 32–45)
pO2, Ven: 35 mmHg (ref 32–45)
pO2, Ven: 39 mmHg (ref 32–45)
pO2, Ven: 38 mmHg (ref 32–45)

## 2021-10-10 LAB — URINALYSIS, COMPLETE (UACMP) WITH MICROSCOPIC
Bilirubin Urine: NEGATIVE
Glucose, UA: 50 mg/dL — AB
Ketones, ur: NEGATIVE mg/dL
Nitrite: NEGATIVE
Protein, ur: 100 mg/dL — AB
Specific Gravity, Urine: 1.017 (ref 1.005–1.030)
pH: 5 (ref 5.0–8.0)

## 2021-10-10 LAB — COMPREHENSIVE METABOLIC PANEL
ALT: 25 U/L (ref 0–44)
AST: 51 U/L — ABNORMAL HIGH (ref 15–41)
Albumin: 3.7 g/dL (ref 3.5–5.0)
Alkaline Phosphatase: 72 U/L (ref 38–126)
Anion gap: 15 (ref 5–15)
BUN: 32 mg/dL — ABNORMAL HIGH (ref 8–23)
CO2: 34 mmol/L — ABNORMAL HIGH (ref 22–32)
Calcium: 9.9 mg/dL (ref 8.9–10.3)
Chloride: 89 mmol/L — ABNORMAL LOW (ref 98–111)
Creatinine, Ser: 0.74 mg/dL (ref 0.44–1.00)
GFR, Estimated: >60 mL/min (ref >60)
Glucose, Bld: 260 mg/dL — ABNORMAL HIGH (ref 70–99)
Potassium: 3.9 mmol/L (ref 3.5–5.1)
Sodium: 138 mmol/L (ref 135–145)
Total Bilirubin: 0.7 mg/dL (ref 0.3–1.2)
Total Protein: 7.5 g/dL (ref 6.5–8.1)

## 2021-10-10 LAB — PROCALCITONIN: Procalcitonin: 0.26 ng/mL

## 2021-10-10 LAB — STREP PNEUMONIAE URINARY ANTIGEN: Strep Pneumo Urinary Antigen: NEGATIVE

## 2021-10-10 LAB — CBG MONITORING, ED: Glucose-Capillary: 163 mg/dL — ABNORMAL HIGH (ref 70–99)

## 2021-10-10 LAB — CBC WITH DIFFERENTIAL/PLATELET
Abs Immature Granulocytes: 0.23 10*3/uL — ABNORMAL HIGH (ref 0.00–0.07)
Basophils Absolute: 0 10*3/uL (ref 0.0–0.1)
Basophils Relative: 0 %
Eosinophils Absolute: 0 10*3/uL (ref 0.0–0.5)
Eosinophils Relative: 0 %
HCT: 43.3 % (ref 36.0–46.0)
Hemoglobin: 12.8 g/dL (ref 12.0–15.0)
Immature Granulocytes: 2 %
Lymphocytes Relative: 3 %
Lymphs Abs: 0.4 10*3/uL — ABNORMAL LOW (ref 0.7–4.0)
MCH: 31.1 pg (ref 26.0–34.0)
MCHC: 29.6 g/dL — ABNORMAL LOW (ref 30.0–36.0)
MCV: 105.4 fL — ABNORMAL HIGH (ref 80.0–100.0)
Monocytes Absolute: 2.1 10*3/uL — ABNORMAL HIGH (ref 0.1–1.0)
Monocytes Relative: 16 %
Neutro Abs: 10.5 10*3/uL — ABNORMAL HIGH (ref 1.7–7.7)
Neutrophils Relative %: 79 %
Platelets: 211 10*3/uL (ref 150–400)
RBC: 4.11 MIL/uL (ref 3.87–5.11)
RDW: 12.2 % (ref 11.5–15.5)
Smear Review: NORMAL
WBC: 13.2 10*3/uL — ABNORMAL HIGH (ref 4.0–10.5)
nRBC: 0 % (ref 0.0–0.2)

## 2021-10-10 LAB — SARS CORONAVIRUS 2 BY RT PCR: SARS Coronavirus 2 by RT PCR: NEGATIVE

## 2021-10-10 LAB — TROPONIN I (HIGH SENSITIVITY)
Troponin I (High Sensitivity): 27 ng/L — ABNORMAL HIGH (ref <18)
Troponin I (High Sensitivity): 43 ng/L — ABNORMAL HIGH (ref <18)

## 2021-10-10 LAB — BRAIN NATRIURETIC PEPTIDE: B Natriuretic Peptide: 470.3 pg/mL — ABNORMAL HIGH (ref 0.0–100.0)

## 2021-10-10 MED ORDER — SODIUM CHLORIDE 0.9 % IV SOLN
500.0000 mg | INTRAVENOUS | Status: DC
  Administered 2021-10-11 – 2021-10-12 (×2): 500 mg via INTRAVENOUS
  Filled 2021-10-10 (×2): qty 500
  Filled 2021-10-10: qty 5

## 2021-10-10 MED ORDER — CEFTRIAXONE SODIUM 1 G IJ SOLR
1.0000 g | Freq: Once | INTRAMUSCULAR | Status: AC
  Administered 2021-10-10: 1 g via INTRAVENOUS
  Filled 2021-10-10: qty 10

## 2021-10-10 MED ORDER — ENOXAPARIN SODIUM 40 MG/0.4ML IJ SOSY
40.0000 mg | PREFILLED_SYRINGE | INTRAMUSCULAR | Status: DC
  Administered 2021-10-10 – 2021-10-14 (×5): 40 mg via SUBCUTANEOUS
  Filled 2021-10-10 (×5): qty 0.4

## 2021-10-10 MED ORDER — METHYLPREDNISOLONE SODIUM SUCC 40 MG IJ SOLR
20.0000 mg | Freq: Two times a day (BID) | INTRAMUSCULAR | Status: DC
  Administered 2021-10-10 – 2021-10-13 (×6): 20 mg via INTRAVENOUS
  Filled 2021-10-10 (×6): qty 1

## 2021-10-10 MED ORDER — DOCUSATE SODIUM 100 MG PO CAPS: 100.0000 mg | ORAL_CAPSULE | Freq: Two times a day (BID) | ORAL | Status: DC | PRN

## 2021-10-10 MED ORDER — IPRATROPIUM-ALBUTEROL 0.5-2.5 (3) MG/3ML IN SOLN
3.0000 mL | Freq: Once | RESPIRATORY_TRACT | Status: AC
Start: 2021-10-10 — End: 2021-10-10
  Administered 2021-10-10: 3 mL via RESPIRATORY_TRACT
  Filled 2021-10-10: qty 3

## 2021-10-10 MED ORDER — LEVOTHYROXINE SODIUM 88 MCG PO TABS
88.0000 ug | ORAL_TABLET | Freq: Every day | ORAL | Status: DC
  Administered 2021-10-11 – 2021-10-15 (×5): 88 ug via ORAL
  Filled 2021-10-10 (×5): qty 1

## 2021-10-10 MED ORDER — INSULIN ASPART 100 UNIT/ML IJ SOLN
0.0000 [IU] | INTRAMUSCULAR | Status: DC
  Administered 2021-10-11 (×6): 3 [IU] via SUBCUTANEOUS
  Administered 2021-10-12: 5 [IU] via SUBCUTANEOUS
  Administered 2021-10-12 (×3): 3 [IU] via SUBCUTANEOUS
  Filled 2021-10-10 (×10): qty 1

## 2021-10-10 MED ORDER — IPRATROPIUM-ALBUTEROL 0.5-2.5 (3) MG/3ML IN SOLN
3.0000 mL | Freq: Four times a day (QID) | RESPIRATORY_TRACT | Status: DC
Start: 2021-10-10 — End: 2021-10-14
  Administered 2021-10-10 – 2021-10-14 (×17): 3 mL via RESPIRATORY_TRACT
  Filled 2021-10-10 (×17): qty 3

## 2021-10-10 MED ORDER — BUDESONIDE 0.5 MG/2ML IN SUSP
0.5000 mg | Freq: Two times a day (BID) | RESPIRATORY_TRACT | Status: DC
  Administered 2021-10-10 – 2021-10-13 (×6): 0.5 mg via RESPIRATORY_TRACT
  Filled 2021-10-10 (×6): qty 2

## 2021-10-10 MED ORDER — FUROSEMIDE 10 MG/ML IJ SOLN
20.0000 mg | Freq: Once | INTRAMUSCULAR | Status: AC
  Administered 2021-10-10: 20 mg via INTRAVENOUS
  Filled 2021-10-10: qty 4

## 2021-10-10 MED ORDER — ONDANSETRON HCL 4 MG/2ML IJ SOLN
4.0000 mg | Freq: Four times a day (QID) | INTRAMUSCULAR | Status: DC | PRN
  Administered 2021-10-14: 4 mg via INTRAVENOUS
  Filled 2021-10-10: qty 2

## 2021-10-10 MED ORDER — POLYETHYLENE GLYCOL 3350 17 G PO PACK: 17.0000 g | PACK | Freq: Every day | ORAL | Status: DC | PRN

## 2021-10-10 MED ORDER — SODIUM CHLORIDE 0.9 % IV SOLN
1.0000 g | INTRAVENOUS | Status: DC
  Administered 2021-10-11 – 2021-10-13 (×3): 1 g via INTRAVENOUS
  Filled 2021-10-10: qty 1
  Filled 2021-10-10: qty 10
  Filled 2021-10-10: qty 1

## 2021-10-10 MED ORDER — AZITHROMYCIN 500 MG IV SOLR
500.0000 mg | Freq: Once | INTRAVENOUS | Status: AC
  Administered 2021-10-10: 500 mg via INTRAVENOUS
  Filled 2021-10-10: qty 5

## 2021-10-10 NOTE — ED Notes (Signed)
Critical lab values  Ph 7.18  PC02 110

## 2021-10-10 NOTE — ED Provider Notes (Signed)
[facility] Provider Note    Event Date/Time   First MD Initiated Contact with Patient 10/10/21 1015     (approximate)   History  Chief complaint shortness of breath   HPI  STAPHANIE Poole is a 76 y.o. female with history of COPD who was found by her home health aide on the floor not acting normally.  EMS arrived initial pulse ox was 50%.  Patient got 3 duo nebs Solu-Medrol and 2 g of magnesium IV patient went from having quite lungs to having wheezing in the upper lobes and still fairly quite in the lower lobes.  Patient is now awake and alert reports that she has been short of breath for several days and having productive cough and fever at home.      Physical Exam   Triage Vital Signs: ED Triage Vitals  Enc Vitals Group     BP      Pulse      Resp      Temp      Temp src      SpO2      Weight      Height      Head Circumference      Peak Flow      Pain Score      Pain Loc      Pain Edu?      Excl. in [employer]?     Most recent vital signs: Vitals:   10/10/21 1515 10/10/21 1526  BP:  (!) 143/54  Pulse: 89 95  Resp: 18 19  Temp:    SpO2: 96% 97%     General: Awake, alert still breathing hard and retracting CV:  Good peripheral perfusion.  Heart regular rate and rhythm no audible murmur Resp:  Increased effort and retractions as noted above decreased breath sounds Abd:  No distention.  Nontender Extremities: No edema   ED Results / Procedures / Treatments   Labs (all labs ordered are listed, but only abnormal results are displayed) Labs Reviewed  BRAIN NATRIURETIC PEPTIDE - Abnormal; Notable for the following components:      Result Value   B Natriuretic Peptide 470.3 (*)    All other components within normal limits  BLOOD GAS, VENOUS - Abnormal; Notable for the following components:   pH, Ven 7.1 (*)    pCO2, Ven 122 (*)    Bicarbonate 37.9 (*)    Acid-Base Excess 4.0 (*)    All other components within normal limits  CBC WITH  DIFFERENTIAL/PLATELET - Abnormal; Notable for the following components:   WBC 13.2 (*)    MCV 105.4 (*)    MCHC 29.6 (*)    Neutro Abs 10.5 (*)    Lymphs Abs 0.4 (*)    Monocytes Absolute 2.1 (*)    Abs Immature Granulocytes 0.23 (*)    All other components within normal limits  COMPREHENSIVE METABOLIC PANEL - Abnormal; Notable for the following components:   Chloride 89 (*)    CO2 34 (*)    Glucose, Bld 260 (*)    BUN 32 (*)    AST 51 (*)    All other components within normal limits  BLOOD GAS, VENOUS - Abnormal; Notable for the following components:   pH, Ven 7.11 (*)    pCO2, Ven 116 (*)    Bicarbonate 36.9 (*)    Acid-Base Excess 3.4 (*)    All other components within normal limits  BLOOD GAS, VENOUS - Abnormal;  Notable for the following components:   pH, Ven 7.15 (*)    pCO2, Ven 112 (*)    Bicarbonate 39.0 (*)    Acid-Base Excess 6.0 (*)    All other components within normal limits  TROPONIN I (HIGH SENSITIVITY) - Abnormal; Notable for the following components:   Troponin I (High Sensitivity) 27 (*)    All other components within normal limits  TROPONIN I (HIGH SENSITIVITY) - Abnormal; Notable for the following components:   Troponin I (High Sensitivity) 43 (*)    All other components within normal limits  CBC WITH DIFFERENTIAL/PLATELET  BLOOD GAS, VENOUS     EKG  EKG read interpreted by me shows normal sinus rhythm rate of 91 normal axis no acute ST-T changes   RADIOLOGY Chest x-ray read interpreted by me could be CHF or pneumonia.  Radiology can see that note agrees  PROCEDURES:  Critical Care performed: Critical care time half an hour.  This includes evaluating the patient reviewing her old records and studies and her new studies.  Additionally I spoke with the hospitalist and the ICU doctor.  Procedures   MEDICATIONS ORDERED IN ED: Medications  ipratropium-albuterol (DUONEB) 0.5-2.5 (3) MG/3ML nebulizer solution 3 mL (3 mLs Nebulization Given 10/10/21  1023)  cefTRIAXone (ROCEPHIN) 1 g in sodium chloride 0.9 % 100 mL IVPB (0 g Intravenous Stopped 10/10/21 1142)  azithromycin (ZITHROMAX) 500 mg in sodium chloride 0.9 % 250 mL IVPB (0 mg Intravenous Stopped 10/10/21 1245)  furosemide (LASIX) injection 20 mg (20 mg Intravenous Given 10/10/21 1255)     IMPRESSION / MDM / ASSESSMENT AND PLAN / ED COURSE  I reviewed the triage vital signs and the nursing notes. ----------------------------------------- 11:21 AM on 10/10/2021 ----------------------------------------- Second blood bank gas comes back slightly better than the first.  Patient sleeping but easily arousable on has a normal mental status when we wake her.  Chest x-ray may represent pneumonia or CHF.  White blood count is elevated which is somewhat of a left shift on the differential.  BNP is still pending.  I have ordered some antibiotics for her at this time in case there is a pneumonia going on her COPD exacerbation in the meantime we will get the BMP back.  Patient did not appear to have any new or met organomegaly or massive JVD or edema in her extremities.  Differential diagnosis includes, but is not limited to, COPD exacerbation CHF or pneumonia are the most likely possibilities.  Patient's presentation is most consistent with acute presentation with potential threat to life or bodily function.  The patient is on the cardiac monitor to evaluate for evidence of arrhythmia and/or significant heart rate changes.  Nothing besides a relatively rapid heart rate were seen  ----------------------------------------- 4:06 PM on 10/10/2021 ----------------------------------------- Patient talking easily on the BiPAP.  ICU wants to intubate her.  We will get 1 more VBG and see if her pH is improved.  I really do not want to intubate her as she is doing quite well on the BiPAP.  However I am going to have to sign the patient out to oncoming provider.  FINAL CLINICAL IMPRESSION(S) / ED DIAGNOSES    Final diagnoses:  Acute respiratory failure with hypercapnia ([place])     Rx / DC Orders   ED Discharge Orders     None        Note:  This document was prepared using Dragon voice recognition software and may include unintentional dictation errors.   Conni Slipper  F, MD 10/10/21 1606

## 2021-10-10 NOTE — ED Notes (Signed)
X-ray at bedside

## 2021-10-10 NOTE — ED Notes (Addendum)
This RN spoke to pts daughter Marcelino Duster at [phone]. Updated on plan of care and that pt will be admitted to hospital. All questions answered.

## 2021-10-10 NOTE — H&P (Signed)
NAME:  [name], MRN:  [id number], DOB:  February 28, 1946, LOS: 0 ADMISSION DATE:  10/10/2021, CONSULTATION DATE:  10/10/2021 REFERRING MD:  Dr. Cinda Quest, CHIEF COMPLAINT:  Shortness of breath  Brief Pt Description / Synopsis:  Acute alteration in mental status Acute Hypercapnic Resp Failure  History of Present Illness:  76 yo WF presented with altered mental status and difficulty breathing. EMS summoned and found her on the scene with sats in the 50s. She was placed on NIV in the ED and found to have a pH 7.1 and pCO2 >> 100. Despite the NIV she remained in that region for several hours, but her wheezing did improve with steroids and nebs.   ED Course: Initial Vital Signs: Significant Labs: Imaging Chest X-ray>> Medications Administered:   Pertinent  Medical History   Past Medical History:  Diagnosis Date   Anxiety    Chest pain    CHF (congestive heart failure) (HCC)    COPD (chronic obstructive pulmonary disease) (HCC)    Depression    Diabetes mellitus without complication (HCC)    Fibromyalgia    Graves disease    Hypertension    IBS (irritable bowel syndrome)    PTSD (post-traumatic stress disorder)    Sinus problem    Spinal stenosis      Micro Data:    Antimicrobials:  CTX AZM  Significant Hospital Events: Including procedures, antibiotic start and stop dates in addition to other pertinent events   NIV   Objective   Blood pressure (!) 143/54, pulse 95, temperature 97.6 F (36.4 C), temperature source Rectal, resp. rate 19, height 5\' 6"  (1.676 m), weight 55.2 kg, SpO2 97 %.        Intake/Output Summary (Last 24 hours) at 10/10/2021 1723 Last data filed at 10/10/2021 1245 Gross per 24 hour  Intake 296.76 ml  Output --  Net 296.76 ml   Filed Weights   10/10/21 1055  Weight: 55.2 kg    Examination: General: Lethargic but arousable HENT: NIV mask Lungs: Diminished, scant migratory wheeze Cardiovascular: RRR, no murmur Abdomen: SNT  +BS Extremities: Trace edema Neuro: Arousable  Assessment & Plan:  AECOPD  NIV switch from AVAPS to BiPAP and remove dead space from circuit Obtain ABG Maintain SABA Maintain steroids CTX/AZM  Best Practice (right click and "Reselect all SmartList Selections" daily)   Diet/type: NPO DVT prophylaxis: LMWH GI prophylaxis: H2B Lines: N/A Foley:  N/A Code Status:  full code  Labs   CBC: Recent Labs  Lab 10/10/21 1019  WBC 13.2*  NEUTROABS 10.5*  HGB 12.8  HCT 43.3  MCV 105.4*  PLT 123456    Basic Metabolic Panel: Recent Labs  Lab 10/10/21 1019  NA 138  K 3.9  CL 89*  CO2 34*  GLUCOSE 260*  BUN 32*  CREATININE 0.74  CALCIUM 9.9   GFR: Estimated Creatinine Clearance: 52.1 mL/min (by C-G formula based on SCr of 0.74 mg/dL). Recent Labs  Lab 10/10/21 1019  WBC 13.2*    Liver Function Tests: Recent Labs  Lab 10/10/21 1019  AST 51*  ALT 25  ALKPHOS 72  BILITOT 0.7  PROT 7.5  ALBUMIN 3.7   No results for input(s): LIPASE, AMYLASE in the last 168 hours. No results for input(s): AMMONIA in the last 168 hours.  ABG    Component Value Date/Time   HCO3 41.1 (H) 10/10/2021 1615   O2SAT 67.9 10/10/2021 1615     Coagulation Profile: No results for input(s): INR, PROTIME in the  last 168 hours.  Cardiac Enzymes: No results for input(s): CKTOTAL, CKMB, CKMBINDEX, TROPONINI in the last 168 hours.  HbA1C: Hemoglobin A1C  Date/Time Value Ref Range Status  10/06/2021 09:54 AM 5.6 4.0 - 5.6 % Final  06/24/2021 02:41 PM 5.6 4.0 - 5.6 % Final   Hgb A1c MFr Bld  Date/Time Value Ref Range Status  03/11/2019 08:58 AM 5.8 (H) 4.8 - 5.6 % Final    Comment:    (NOTE) Pre diabetes:          5.7%-6.4% Diabetes:              >6.4% Glycemic control for   <7.0% adults with diabetes   01/04/2017 03:31 PM 6.1 (H) 4.8 - 5.6 % Final    Comment:    (NOTE) Pre diabetes:          5.7%-6.4% Diabetes:              >6.4% Glycemic control for   <7.0% adults with  diabetes     CBG: No results for input(s): GLUCAP in the last 168 hours.  Review of Systems:   Unable to assess due to BiPAP  Past Medical History:  She,  has a past medical history of Anxiety, Chest pain, CHF (congestive heart failure) ([place]), COPD (chronic obstructive pulmonary disease) ([place]), Depression, Diabetes mellitus without complication ([place]), Fibromyalgia, Graves disease, Hypertension, IBS (irritable bowel syndrome), PTSD (post-traumatic stress disorder), Sinus problem, and Spinal stenosis.   Surgical History:   Past Surgical History:  Procedure Laterality Date   ABDOMINAL HYSTERECTOMY     CAST APPLICATION  [id number]   Procedure: Application of long-leg hinged brace right lower extremity.;  Surgeon: Corky Mull, MD;  Location: [facility] ORS;  Service: Orthopedics;;   ESOPHAGOGASTRODUODENOSCOPY (EGD) WITH PROPOFOL N/A 01/10/2015   Procedure: ESOPHAGOGASTRODUODENOSCOPY (EGD) WITH PROPOFOL;  Surgeon: Hulen Luster, MD;  Location: [facility] ENDOSCOPY;  Service: Gastroenterology;  Laterality: N/A;   ORIF ELBOW FRACTURE Left 11/19/2020   Procedure: OPEN REDUCTION INTERNAL FIXATION (ORIF) ELBOW/OLECRANON FRACTURE;  Surgeon: Corky Mull, MD;  Location: [facility] ORS;  Service: Orthopedics;  Laterality: Left;   RECTAL SURGERY     RIGHT/LEFT HEART CATH AND CORONARY ANGIOGRAPHY Bilateral 04/08/2020   Procedure: RIGHT/LEFT HEART CATH AND CORONARY ANGIOGRAPHY;  Surgeon: Wellington Hampshire, MD;  Location: [facility] CV LAB;  Service: Cardiovascular;  Laterality: Bilateral;   SAVORY DILATION N/A 01/10/2015   Procedure: SAVORY DILATION;  Surgeon: Hulen Luster, MD;  Location: [facility] ENDOSCOPY;  Service: Gastroenterology;  Laterality: N/A;   toenail removal Bilateral    great toes   TONSILLECTOMY AND ADENOIDECTOMY       Social History:   reports that she quit smoking about 5 years ago. Her smoking use included cigarettes. She has a 12.50 pack-year smoking history. She has never used smokeless tobacco. She reports  that she does not drink alcohol and does not use drugs.   Family History:  Her family history includes Alcohol abuse in her father, mother, and sister; Asthma in her sister; COPD in her sister; Depression in her father, maternal grandmother, mother, and sister; Diabetes in her paternal grandmother and sister; Heart disease in her sister; Hypertension in her sister; Stroke in her paternal grandmother; Varicose Veins in her mother.   Allergies Allergies  Allergen Reactions   Celecoxib     Other reaction(s): Other (qualifier value) Patient is allergic to Celebrex and found that it caused heart failure. CHF   Pregabalin     Other reaction(s):  Localized superficial swelling of skin     Home Medications  Prior to Admission medications   Medication Sig Start Date End Date Taking? Authorizing Provider  acetaminophen (TYLENOL) 500 MG tablet Take 1,000 mg by mouth in the morning, at noon, and at bedtime.    [provider]  albuterol (VENTOLIN HFA) 108 (90 Base) MCG/ACT inhaler INAHLE 2 PUFFS INTO THE LUNGS EVERY 6 HOURS AS NEEDED FOR WHEEZING OR SHORTNESS OF BREATH 05/20/21   Jonetta Osgood, NP  alendronate (FOSAMAX) 70 MG tablet Take 1 tablet (70 mg total) by mouth every Tuesday. 03/18/21   Jonetta Osgood, NP  aspirin EC 81 MG tablet Take 81 mg by mouth daily. Swallow whole.    [provider]  Azelastine HCl 137 MCG/SPRAY SOLN Place 2 sprays into both nostrils in the morning and at bedtime. 03/18/21   Jonetta Osgood, NP  azithromycin (ZITHROMAX) 250 MG tablet Take one tab a day for 10 days for uri 10/06/21   Mylinda Latina, PA-C  Ergocalciferol 50 MCG (2000 UT) TABS Take 2 capsules by mouth daily. 03/18/21   Jonetta Osgood, NP  FLUoxetine (PROZAC) 40 MG capsule Take 1 capsule (40 mg total) by mouth daily as needed. 06/24/21   Jonetta Osgood, NP  fluticasone (FLONASE) 50 MCG/ACT nasal spray Place 2 sprays into both nostrils in the morning and at bedtime. 03/18/21    Jonetta Osgood, NP  hydrocortisone 2.5 % cream Place 1 application rectally 2 (two) times daily as needed (itching/hemorroids).     [provider]  ipratropium-albuterol (DUONEB) 0.5-2.5 (3) MG/3ML SOLN USE 1 VIAL IN NEBULIZER 3 TIMES DAILY 08/25/21   Jonetta Osgood, NP  levothyroxine (SYNTHROID) 88 MCG tablet Take 1 tablet (88 mcg total) by mouth daily before breakfast. Take 30 minutes before food and with a full glass of water 10/06/21   McDonough, Lauren K, PA-C  LORazepam (ATIVAN) 1 MG tablet Take 1 tablet (1 mg total) by mouth 2 (two) times daily as needed for anxiety. 10/06/21   McDonough, Si Gaul, PA-C  lubiprostone (AMITIZA) 8 MCG capsule Take 1 capsule (8 mcg total) by mouth daily with breakfast. 03/18/21   Jonetta Osgood, NP  meclizine (ANTIVERT) 25 MG tablet Take 1 tablet (25 mg total) by mouth 3 (three) times daily as needed for dizziness or nausea. 03/18/21   Jonetta Osgood, NP  mirtazapine (REMERON) 15 MG tablet Take 1 tablet (15 mg total) by mouth at bedtime. 05/29/21   Jonetta Osgood, NP  montelukast (SINGULAIR) 10 MG tablet Take 1 tablet (10 mg total) by mouth at bedtime. 04/22/21   Jonetta Osgood, NP  Multiple Vitamin (MULTIVITAMIN WITH MINERALS) TABS tablet Take 1 tablet by mouth daily. Multivitamin for Seniors    [provider]  ondansetron (ZOFRAN) 4 MG tablet Take 1 tablet (4 mg total) by mouth every 6 (six) hours as needed for nausea. 03/18/21   Jonetta Osgood, NP  OXYGEN Inhale into the lungs. Pt is on 2 liters oxygen 24 hrs uses [place]    [provider]  predniSONE (DELTASONE) 10 MG tablet Take one tab 3 x day for 3 days, then take one tab 2 x a day for 3 days and then take one tab a day for 3 days for copd 10/06/21   McDonough, Lauren K, PA-C  rosuvastatin (CRESTOR) 20 MG tablet Take 1 tablet (20 mg total) by mouth daily. 03/18/21   Jonetta Osgood, NP  SYMBICORT 160-4.5 MCG/ACT inhaler Inhale 2 puffs into the lungs 2  (  two) times daily. 05/29/21   Jonetta Osgood, NP  tiotropium (SPIRIVA HANDIHALER) 18 MCG inhalation capsule Place 1 capsule (18 mcg total) into inhaler and inhale daily. 03/28/21   Jonetta Osgood, NP  traMADol (ULTRAM) 50 MG tablet Take 1 tablet (50 mg total) by mouth 2 (two) times daily as needed for moderate pain or severe pain. 10/06/21   McDonough, Lauren K, PA-C  enoxaparin (LOVENOX) 40 MG/0.4ML injection Inject 0.4 mLs (40 mg total) into the skin daily. Patient not taking: Reported on 12/14/2020 11/21/20 12/15/20  Lattie Corns, PA-C     Critical care time: 50 minutes

## 2021-10-10 NOTE — ED Triage Notes (Signed)
Pt to ED via [facility] emergency traffic from home. PT's home aide came to check on pt and found her minimally responsive and on the floor. FD initial sats reading 50% on RA and placed on NRB. Pt lung sounds absent initially. Pt given 3 neb tx, 125mg  solumedrol and 2g mad PTA. After tx pt more responsive and verbal with EMS.   RT at bedside to place pt on BiPAP.  CBG 204 BP 170/54 HR 80 18g RFA

## 2021-10-10 NOTE — ED Notes (Signed)
Pt cleaned up. Pt attempted getting out of bed, and urinated on the floor. No posey alarm pad available.

## 2021-10-10 NOTE — ED Notes (Signed)
Pt provided with more warm blankets.

## 2021-10-11 ENCOUNTER — Inpatient Hospital Stay: Payer: Medicare Other

## 2021-10-11 DIAGNOSIS — J9602 Acute respiratory failure with hypercapnia: Secondary | ICD-10-CM | POA: Diagnosis not present

## 2021-10-11 DIAGNOSIS — J441 Chronic obstructive pulmonary disease with (acute) exacerbation: Secondary | ICD-10-CM

## 2021-10-11 DIAGNOSIS — J9601 Acute respiratory failure with hypoxia: Secondary | ICD-10-CM | POA: Diagnosis not present

## 2021-10-11 LAB — CBC
HCT: 39.7 % (ref 36.0–46.0)
Hemoglobin: 11.9 g/dL — ABNORMAL LOW (ref 12.0–15.0)
MCH: 31 pg (ref 26.0–34.0)
MCHC: 30 g/dL (ref 30.0–36.0)
MCV: 103.4 fL — ABNORMAL HIGH (ref 80.0–100.0)
Platelets: 191 10*3/uL (ref 150–400)
RBC: 3.84 MIL/uL — ABNORMAL LOW (ref 3.87–5.11)
RDW: 12.1 % (ref 11.5–15.5)
WBC: 11.6 10*3/uL — ABNORMAL HIGH (ref 4.0–10.5)
nRBC: 0 % (ref 0.0–0.2)

## 2021-10-11 LAB — MRSA NEXT GEN BY PCR, NASAL: MRSA by PCR Next Gen: NOT DETECTED

## 2021-10-11 LAB — BASIC METABOLIC PANEL
Anion gap: 11 (ref 5–15)
BUN: 31 mg/dL — ABNORMAL HIGH (ref 8–23)
CO2: 38 mmol/L — ABNORMAL HIGH (ref 22–32)
Calcium: 9.6 mg/dL (ref 8.9–10.3)
Chloride: 90 mmol/L — ABNORMAL LOW (ref 98–111)
Creatinine, Ser: 0.67 mg/dL (ref 0.44–1.00)
GFR, Estimated: >60 mL/min (ref >60)
Glucose, Bld: 131 mg/dL — ABNORMAL HIGH (ref 70–99)
Potassium: 3.5 mmol/L (ref 3.5–5.1)
Sodium: 139 mmol/L (ref 135–145)

## 2021-10-11 LAB — GLUCOSE, CAPILLARY
Glucose-Capillary: 115 mg/dL — ABNORMAL HIGH (ref 70–99)
Glucose-Capillary: 154 mg/dL — ABNORMAL HIGH (ref 70–99)
Glucose-Capillary: 151 mg/dL — ABNORMAL HIGH (ref 70–99)
Glucose-Capillary: 170 mg/dL — ABNORMAL HIGH (ref 70–99)
Glucose-Capillary: 161 mg/dL — ABNORMAL HIGH (ref 70–99)

## 2021-10-11 LAB — PHOSPHORUS
Phosphorus: 1.4 mg/dL — ABNORMAL LOW (ref 2.5–4.6)
Phosphorus: 3.4 mg/dL (ref 2.5–4.6)

## 2021-10-11 LAB — PROCALCITONIN: Procalcitonin: 0.47 ng/mL

## 2021-10-11 LAB — MAGNESIUM: Magnesium: 2.5 mg/dL — ABNORMAL HIGH (ref 1.7–2.4)

## 2021-10-11 MED ORDER — CHLORHEXIDINE GLUCONATE 0.12 % MT SOLN
15.0000 mL | Freq: Two times a day (BID) | OROMUCOSAL | Status: DC
  Administered 2021-10-11 – 2021-10-13 (×4): 15 mL via OROMUCOSAL
  Filled 2021-10-11: qty 15

## 2021-10-11 MED ORDER — ACETAMINOPHEN 325 MG PO TABS
650.0000 mg | ORAL_TABLET | Freq: Four times a day (QID) | ORAL | Status: DC | PRN
  Administered 2021-10-11 – 2021-10-14 (×5): 650 mg via ORAL
  Filled 2021-10-11 (×6): qty 2

## 2021-10-11 MED ORDER — POTASSIUM PHOSPHATES 15 MMOLE/5ML IV SOLN
45.0000 mmol | Freq: Once | INTRAVENOUS | Status: AC
  Administered 2021-10-11: 45 mmol via INTRAVENOUS
  Filled 2021-10-11: qty 15

## 2021-10-11 MED ORDER — POTASSIUM PHOSPHATES 15 MMOLE/5ML IV SOLN
45.0000 mmol | Freq: Once | INTRAVENOUS | Status: DC
  Filled 2021-10-11: qty 15

## 2021-10-11 MED ORDER — CHLORHEXIDINE GLUCONATE CLOTH 2 % EX PADS
6.0000 | MEDICATED_PAD | Freq: Every day | CUTANEOUS | Status: DC
  Administered 2021-10-11 – 2021-10-12 (×2): 6 via TOPICAL

## 2021-10-11 MED ORDER — LORAZEPAM 2 MG/ML IJ SOLN
1.0000 mg | Freq: Two times a day (BID) | INTRAMUSCULAR | Status: DC | PRN
  Administered 2021-10-11 (×2): 1 mg via INTRAVENOUS
  Filled 2021-10-11 (×2): qty 1

## 2021-10-11 MED ORDER — ORAL CARE MOUTH RINSE
15.0000 mL | Freq: Two times a day (BID) | OROMUCOSAL | Status: DC
  Administered 2021-10-12 (×2): 15 mL via OROMUCOSAL

## 2021-10-11 NOTE — Consult Note (Signed)
PHARMACY CONSULT NOTE - FOLLOW UP  Pharmacy Consult for Electrolyte Monitoring and Replacement   Recent Labs: Potassium (mmol/L)  Date Value  10/11/2021 3.5  12/27/2013 3.5   Magnesium (mg/dL)  Date Value  10/11/2021 2.5 (H)   Calcium (mg/dL)  Date Value  10/11/2021 9.6   Calcium, Total (mg/dL)  Date Value  12/27/2013 8.8   Albumin (g/dL)  Date Value  10/10/2021 3.7  12/27/2013 3.4   Phosphorus (mg/dL)  Date Value  10/11/2021 1.4 (L)   Sodium (mmol/L)  Date Value  10/11/2021 139  03/26/2020 137  12/27/2013 138     Assessment: Pharmacy has been consulted to monitor and replace electrolytes in 75yo patient admitted with shortness of breath, arriving via EMS with initial pulse ox of 50%. CXR showing possible pneumonia vs CHF.  No IVF currently running.  Goal of Therapy:  Electrolytes WNL  Plan:  - CCMD has ordered potassium phosphate 40mmol IV x 1 dose. - No further supplementation currently required - will recheck electrolytes with AM labs  Pearla Dubonnet ,PharmD Clinical Pharmacist 10/11/2021 7:29 AM

## 2021-10-11 NOTE — H&P (Signed)
NAME:  [name], MRN:  [id number], DOB:  1945/11/26, LOS: 1 ADMISSION DATE:  10/10/2021, CONSULTATION DATE:  10/10/2021 REFERRING MD:  Dr. Darnelle Catalan, CHIEF COMPLAINT:  Shortness of breath  Brief Pt Description / Synopsis:  Acute alteration in mental status Acute Hypercapnic Resp Failure  History of Present Illness:  76 yo WF presented with altered mental status and difficulty breathing. EMS summoned and found her on the scene with sats in the 50s. She was placed on NIV in the ED and found to have a pH 7.1 and pCO2 >> 100. Despite the NIV she remained in that region for several hours, but her wheezing did improve with steroids and nebs.    Pertinent  Medical History   Past Medical History:  Diagnosis Date   Anxiety    Chest pain    CHF (congestive heart failure) (HCC)    COPD (chronic obstructive pulmonary disease) (HCC)    Depression    Diabetes mellitus without complication (HCC)    Fibromyalgia    Graves disease    Hypertension    IBS (irritable bowel syndrome)    PTSD (post-traumatic stress disorder)    Sinus problem    Spinal stenosis      Micro Data:  NG  Antimicrobials:  CTX AZM  Significant Hospital Events: Including procedures, antibiotic start and stop dates in addition to other pertinent events   NIV   Objective   Blood pressure (!) 141/69, pulse (!) 25, temperature 98.5 F (36.9 C), temperature source Oral, resp. rate 17, height  (1.676 m), weight 53.3 kg, SpO2 98 %.    FiO2 (%):  [40 %] 40 %   Intake/Output Summary (Last 24 hours) at 10/11/2021 0804 Last data filed at 10/11/2021 0103 Gross per 24 hour  Intake 296.76 ml  Output 650 ml  Net -353.24 ml    Filed Weights   10/10/21 1055 10/11/21 0400  Weight: 55.2 kg 53.3 kg    Examination: General: more arousable HENT: PERRLA EOMI, NIV off at moment Lungs: Diminished, scant migratory wheeze Cardiovascular: RRR, no murmur Abdomen: SNT +BS Extremities: Trace edema Neuro: Arousable   Assessment & Plan:  AECOPD NIV switch from AVAPS to BiPAP and remove dead space from circuit yesterday ABG corrected with this to chronic compensation levels  Latest Reference Range & Units 10/10/21 17:40  FIO2 % 40  Inspiratory PAP cmH2O 17  Expiratory PAP cmH2O 5  pH, Arterial 7.35 - 7.45  7.38  pCO2 arterial 32 - 48 mmHg 72 (HH)  pO2, Arterial 83 - 108 mmHg 111 (H)  Acid-Base Excess 0.0 - 2.0 mmol/L 14.0 (H)  Bicarbonate 20.0 - 28.0 mmol/L 42.6 (H)  (Maintain aggressive SABA Maintain steroids CTX/AZM Once able will start maintenance inhaled regimen   Best Practice (right click and "Reselect all SmartList Selections" daily)   Diet/type: NPO DVT prophylaxis: LMWH GI prophylaxis: H2B Lines: N/A Foley:  N/A Code Status:  full code  Labs   CBC: Recent Labs  Lab 10/10/21 1019 10/11/21 0515  WBC 13.2* 11.6*  NEUTROABS 10.5*  --   HGB 12.8 11.9*  HCT 43.3 39.7  MCV 105.4* 103.4*  PLT 211 191     Basic Metabolic Panel: Recent Labs  Lab 10/10/21 1019 10/11/21 0515  NA 138 139  K 3.9 3.5  CL 89* 90*  CO2 34* 38*  GLUCOSE 260* 131*  BUN 32* 31*  CREATININE 0.74 0.67  CALCIUM 9.9 9.6  MG  --  2.5*  PHOS  --  1.4*    GFR: Estimated Creatinine Clearance: 50.3 mL/min (by C-G formula based on SCr of 0.67 mg/dL). Recent Labs  Lab 10/10/21 1019 10/10/21 1036 10/11/21 0515  PROCALCITON  --  0.26 0.47  WBC 13.2*  --  11.6*     Liver Function Tests: Recent Labs  Lab 10/10/21 1019  AST 51*  ALT 25  ALKPHOS 72  BILITOT 0.7  PROT 7.5  ALBUMIN 3.7    No results for input(s): LIPASE, AMYLASE in the last 168 hours. No results for input(s): AMMONIA in the last 168 hours.  ABG    Component Value Date/Time   PHART 7.38 10/10/2021 1740   PCO2ART 72 (HH) 10/10/2021 1740   PO2ART 111 (H) 10/10/2021 1740   HCO3 42.6 (H) 10/10/2021 1740   O2SAT PENDING 10/10/2021 1740      Coagulation Profile: No results for input(s): INR, PROTIME in the last 168  hours.  Cardiac Enzymes: No results for input(s): CKTOTAL, CKMB, CKMBINDEX, TROPONINI in the last 168 hours.  HbA1C: Hemoglobin A1C  Date/Time Value Ref Range Status  10/06/2021 09:54 AM 5.6 4.0 - 5.6 % Final  06/24/2021 02:41 PM 5.6 4.0 - 5.6 % Final   Hgb A1c MFr Bld  Date/Time Value Ref Range Status  03/11/2019 08:58 AM 5.8 (H) 4.8 - 5.6 % Final    Comment:    (NOTE) Pre diabetes:          5.7%-6.4% Diabetes:              >6.4% Glycemic control for   <7.0% adults with diabetes   01/04/2017 03:31 PM 6.1 (H) 4.8 - 5.6 % Final    Comment:    (NOTE) Pre diabetes:          5.7%-6.4% Diabetes:              >6.4% Glycemic control for   <7.0% adults with diabetes     CBG: Recent Labs  Lab 10/10/21 2341 10/11/21 0735  GLUCAP 163* 115*    Review of Systems:   Unable to assess due to BiPAP  Past Medical History:  She,  has a past medical history of Anxiety, Chest pain, CHF (congestive heart failure) (HCC), COPD (chronic obstructive pulmonary disease) (HCC), Depression, Diabetes mellitus without complication (HCC), Fibromyalgia, Graves disease, Hypertension, IBS (irritable bowel syndrome), PTSD (post-traumatic stress disorder), Sinus problem, and Spinal stenosis.   Surgical History:   Past Surgical History:  Procedure Laterality Date   ABDOMINAL HYSTERECTOMY     CAST APPLICATION  11/19/2020   Procedure: Application of long-leg hinged brace right lower extremity.;  Surgeon: Christena Flake, MD;  Location: [facility] ORS;  Service: Orthopedics;;   ESOPHAGOGASTRODUODENOSCOPY (EGD) WITH PROPOFOL N/A 01/10/2015   Procedure: ESOPHAGOGASTRODUODENOSCOPY (EGD) WITH PROPOFOL;  Surgeon: Wallace Cullens, MD;  Location[facility] ENDOSCOPY;  Service: Gastroenterology;  Laterality: N/A;   ORIF ELBOW FRACTURE Left 11/19/2020   Procedure: OPEN REDUCTION INTERNAL FIXATION (ORIF) ELBOW/OLECRANON FRACTURE;  Surgeon: Christena Flake, MD;  Location: [facility] ORS;  Service: Orthopedics;  Laterality: Left;   RECTAL  SURGERY     RIGHT/LEFT HEART CATH AND CORONARY ANGIOGRAPHY Bilateral 04/08/2020   Procedure: RIGHT/LEFT HEART CATH AND CORONARY ANGIOGRAPHY;  Surgeon: Iran Ouch, MD;  Location: [facility] INVASIVE CV LAB;  Service: Cardiovascular;  Laterality: Bilateral;   SAVORY DILATION N/A 01/10/2015   Procedure: SAVORY DILATION;  Surgeon: Wallace Cullens, MD;  Location: [facility] ENDOSCOPY;  Service: Gastroenterology;  Laterality: N/A;   toenail removal Bilateral  great toes   TONSILLECTOMY AND ADENOIDECTOMY       Social History:   reports that she quit smoking about 5 years ago. Her smoking use included cigarettes. She has a 12.50 pack-year smoking history. She has never used smokeless tobacco. She reports that she does not drink alcohol and does not use drugs.   Family History:  Her family history includes Alcohol abuse in her father, mother, and sister; Asthma in her sister; COPD in her sister; Depression in her father, maternal grandmother, mother, and sister; Diabetes in her paternal grandmother and sister; Heart disease in her sister; Hypertension in her sister; Stroke in her paternal grandmother; Varicose Veins in her mother.   Allergies Allergies  Allergen Reactions   Celecoxib     Other reaction(s): Other (qualifier value) Patient is allergic to Celebrex and found that it caused heart failure. CHF   Pregabalin     Other reaction(s): Localized superficial swelling of skin     Home Medications  Prior to Admission medications   Medication Sig Start Date End Date Taking? Authorizing Provider  acetaminophen (TYLENOL) 500 MG tablet Take 1,000 mg by mouth in the morning, at noon, and at bedtime.    [provider]  albuterol (VENTOLIN HFA) 108 (90 Base) MCG/ACT inhaler INAHLE 2 PUFFS INTO THE LUNGS EVERY 6 HOURS AS NEEDED FOR WHEEZING OR SHORTNESS OF BREATH 05/20/21   Sallyanne KusterAbernathy, Alyssa, NP  alendronate (FOSAMAX) 70 MG tablet Take 1 tablet (70 mg total) by mouth every Tuesday. 03/18/21   Sallyanne KusterAbernathy,  Alyssa, NP  aspirin EC 81 MG tablet Take 81 mg by mouth daily. Swallow whole.    [provider]  Azelastine HCl 137 MCG/SPRAY SOLN Place 2 sprays into both nostrils in the morning and at bedtime. 03/18/21   Sallyanne KusterAbernathy, Alyssa, NP  azithromycin (ZITHROMAX) 250 MG tablet Take one tab a day for 10 days for uri 10/06/21   Carlean JewsMcDonough, Lauren K, PA-C  Ergocalciferol 50 MCG (2000 UT) TABS Take 2 capsules by mouth daily. 03/18/21   Sallyanne KusterAbernathy, Alyssa, NP  FLUoxetine (PROZAC) 40 MG capsule Take 1 capsule (40 mg total) by mouth daily as needed. 06/24/21   Sallyanne KusterAbernathy, Alyssa, NP  fluticasone (FLONASE) 50 MCG/ACT nasal spray Place 2 sprays into both nostrils in the morning and at bedtime. 03/18/21   Sallyanne KusterAbernathy, Alyssa, NP  hydrocortisone 2.5 % cream Place 1 application rectally 2 (two) times daily as needed (itching/hemorroids).     [provider]  ipratropium-albuterol (DUONEB) 0.5-2.5 (3) MG/3ML SOLN USE 1 VIAL IN NEBULIZER 3 TIMES DAILY 08/25/21   Sallyanne KusterAbernathy, Alyssa, NP  levothyroxine (SYNTHROID) 88 MCG tablet Take 1 tablet (88 mcg total) by mouth daily before breakfast. Take 30 minutes before food and with a full glass of water 10/06/21   McDonough, Lauren K, PA-C  LORazepam (ATIVAN) 1 MG tablet Take 1 tablet (1 mg total) by mouth 2 (two) times daily as needed for anxiety. 10/06/21   McDonough, Salomon FickLauren K, PA-C  lubiprostone (AMITIZA) 8 MCG capsule Take 1 capsule (8 mcg total) by mouth daily with breakfast. 03/18/21   Sallyanne KusterAbernathy, Alyssa, NP  meclizine (ANTIVERT) 25 MG tablet Take 1 tablet (25 mg total) by mouth 3 (three) times daily as needed for dizziness or nausea. 03/18/21   Sallyanne KusterAbernathy, Alyssa, NP  mirtazapine (REMERON) 15 MG tablet Take 1 tablet (15 mg total) by mouth at bedtime. 05/29/21   Sallyanne KusterAbernathy, Alyssa, NP  montelukast (SINGULAIR) 10 MG tablet Take 1 tablet (10 mg total) by mouth at bedtime. 04/22/21  Sallyanne Kuster, NP  Multiple Vitamin (MULTIVITAMIN WITH MINERALS) TABS tablet Take 1 tablet by  mouth daily. Multivitamin for Seniors    [provider]  ondansetron (ZOFRAN) 4 MG tablet Take 1 tablet (4 mg total) by mouth every 6 (six) hours as needed for nausea. 03/18/21   Sallyanne Kuster, NP  OXYGEN Inhale into the lungs. Pt is on 2 liters oxygen 24 hrs uses [facility]    [provider]  predniSONE (DELTASONE) 10 MG tablet Take one tab 3 x day for 3 days, then take one tab 2 x a day for 3 days and then take one tab a day for 3 days for copd 10/06/21   McDonough, Lauren K, PA-C  rosuvastatin (CRESTOR) 20 MG tablet Take 1 tablet (20 mg total) by mouth daily. 03/18/21   Sallyanne Kuster, NP  SYMBICORT 160-4.5 MCG/ACT inhaler Inhale 2 puffs into the lungs 2 (two) times daily. 05/29/21   Sallyanne Kuster, NP  tiotropium (SPIRIVA HANDIHALER) 18 MCG inhalation capsule Place 1 capsule (18 mcg total) into inhaler and inhale daily. 03/28/21   Sallyanne Kuster, NP  traMADol (ULTRAM) 50 MG tablet Take 1 tablet (50 mg total) by mouth 2 (two) times daily as needed for moderate pain or severe pain. 10/06/21   McDonough, Lauren K, PA-C  enoxaparin (LOVENOX) 40 MG/0.4ML injection Inject 0.4 mLs (40 mg total) into the skin daily. Patient not taking: Reported on 12/14/2020 11/21/20 12/15/20  Anson [place], PA-C     Critical care time: 50 minutes

## 2021-10-11 NOTE — ED Notes (Signed)
Pt has repeated to try getting after redirected to lay in bed. RN was notified

## 2021-10-11 NOTE — Plan of Care (Signed)
  Problem: Education: Goal: Knowledge of General Education information will improve Description: Including pain rating scale, medication(s)/side effects and non-pharmacologic comfort measures Outcome: Not Progressing Note: Unable to complete patient profile due to confusion.

## 2021-10-11 NOTE — ED Notes (Signed)
Report attempted to ICU

## 2021-10-11 NOTE — ED Notes (Addendum)
Pt continuing to try to get out of bed while on bipap. ICU NP notified to see if something can be ordered to help to help pt agitation.

## 2021-10-11 NOTE — ED Notes (Signed)
Report attempted to ICU x2

## 2021-10-12 DIAGNOSIS — J9602 Acute respiratory failure with hypercapnia: Secondary | ICD-10-CM | POA: Diagnosis not present

## 2021-10-12 DIAGNOSIS — J9601 Acute respiratory failure with hypoxia: Secondary | ICD-10-CM | POA: Diagnosis not present

## 2021-10-12 LAB — BASIC METABOLIC PANEL
Anion gap: 10 (ref 5–15)
BUN: 28 mg/dL — ABNORMAL HIGH (ref 8–23)
CO2: 37 mmol/L — ABNORMAL HIGH (ref 22–32)
Calcium: 8.8 mg/dL — ABNORMAL LOW (ref 8.9–10.3)
Chloride: 95 mmol/L — ABNORMAL LOW (ref 98–111)
Creatinine, Ser: 0.59 mg/dL (ref 0.44–1.00)
GFR, Estimated: >60 mL/min (ref >60)
Glucose, Bld: 167 mg/dL — ABNORMAL HIGH (ref 70–99)
Potassium: 3.9 mmol/L (ref 3.5–5.1)
Sodium: 142 mmol/L (ref 135–145)

## 2021-10-12 LAB — PHOSPHORUS: Phosphorus: 2.3 mg/dL — ABNORMAL LOW (ref 2.5–4.6)

## 2021-10-12 LAB — CBC
HCT: 38.4 % (ref 36.0–46.0)
Hemoglobin: 12 g/dL (ref 12.0–15.0)
MCH: 31.5 pg (ref 26.0–34.0)
MCHC: 31.3 g/dL (ref 30.0–36.0)
MCV: 100.8 fL — ABNORMAL HIGH (ref 80.0–100.0)
Platelets: 233 10*3/uL (ref 150–400)
RBC: 3.81 MIL/uL — ABNORMAL LOW (ref 3.87–5.11)
RDW: 12.4 % (ref 11.5–15.5)
WBC: 9.5 10*3/uL (ref 4.0–10.5)
nRBC: 0.2 % (ref 0.0–0.2)

## 2021-10-12 LAB — GLUCOSE, CAPILLARY
Glucose-Capillary: 173 mg/dL — ABNORMAL HIGH (ref 70–99)
Glucose-Capillary: 146 mg/dL — ABNORMAL HIGH (ref 70–99)
Glucose-Capillary: 169 mg/dL — ABNORMAL HIGH (ref 70–99)
Glucose-Capillary: 197 mg/dL — ABNORMAL HIGH (ref 70–99)
Glucose-Capillary: 226 mg/dL — ABNORMAL HIGH (ref 70–99)
Glucose-Capillary: 160 mg/dL — ABNORMAL HIGH (ref 70–99)

## 2021-10-12 LAB — MAGNESIUM: Magnesium: 2.6 mg/dL — ABNORMAL HIGH (ref 1.7–2.4)

## 2021-10-12 LAB — PROCALCITONIN: Procalcitonin: 0.26 ng/mL

## 2021-10-12 MED ORDER — FLUOXETINE HCL 20 MG PO CAPS
40.0000 mg | ORAL_CAPSULE | Freq: Every day | ORAL | Status: DC
  Administered 2021-10-13 – 2021-10-15 (×3): 40 mg via ORAL
  Filled 2021-10-12 (×3): qty 2

## 2021-10-12 MED ORDER — MIRTAZAPINE 15 MG PO TABS
7.5000 mg | ORAL_TABLET | Freq: Every day | ORAL | Status: DC
  Administered 2021-10-13 – 2021-10-14 (×2): 7.5 mg via ORAL
  Filled 2021-10-12 (×3): qty 1

## 2021-10-12 MED ORDER — INSULIN ASPART 100 UNIT/ML IJ SOLN
0.0000 [IU] | INTRAMUSCULAR | Status: DC
  Administered 2021-10-12: 4 [IU] via SUBCUTANEOUS
  Administered 2021-10-13: 3 [IU] via SUBCUTANEOUS
  Administered 2021-10-13: 4 [IU] via SUBCUTANEOUS
  Administered 2021-10-13: 7 [IU] via SUBCUTANEOUS
  Filled 2021-10-12 (×4): qty 1

## 2021-10-12 MED ORDER — POTASSIUM PHOSPHATES 15 MMOLE/5ML IV SOLN
30.0000 mmol | Freq: Once | INTRAVENOUS | Status: AC
  Administered 2021-10-12: 30 mmol via INTRAVENOUS
  Filled 2021-10-12: qty 10

## 2021-10-12 NOTE — H&P (Signed)
NAME:  [name], MRN:  [id number], DOB:  1945/09/22, LOS: 2 ADMISSION DATE:  10/10/2021, CONSULTATION DATE:  10/10/2021 REFERRING MD:  Dr. Darnelle Catalan, CHIEF COMPLAINT:  Shortness of breath  Brief Pt Description / Synopsis:  Acute alteration in mental status Acute Hypercapnic Resp Failure  History of Present Illness:  76 yo WF presented with altered mental status and difficulty breathing. EMS summoned and found her on the scene with sats in the 50s. She was placed on NIV in the ED and found to have a pH 7.1 and pCO2 >> 100. Despite the NIV she remained in that region for several hours, but her wheezing did improve with steroids and nebs.    Pertinent  Medical History   Past Medical History:  Diagnosis Date   Anxiety    Chest pain    CHF (congestive heart failure) (HCC)    COPD (chronic obstructive pulmonary disease) (HCC)    Depression    Diabetes mellitus without complication (HCC)    Fibromyalgia    Graves disease    Hypertension    IBS (irritable bowel syndrome)    PTSD (post-traumatic stress disorder)    Sinus problem    Spinal stenosis      Micro Data:  NG  Antimicrobials:  CTX AZM  Significant Hospital Events: Including procedures, antibiotic start and stop dates in addition to other pertinent events   NIV   Objective   Blood pressure (!) 170/85, pulse 98, temperature 97.6 F (36.4 C), temperature source Axillary, resp. rate 17, height 5\' 6"  (1.676 m), weight 51.6 kg, SpO2 100 %.    FiO2 (%):  [40 %] 40 %   Intake/Output Summary (Last 24 hours) at 10/12/2021 1636 Last data filed at 10/12/2021 1500 Gross per 24 hour  Intake 241.3 ml  Output 1058 ml  Net -816.7 ml    Filed Weights   10/10/21 1055 10/11/21 0400 10/12/21 0500  Weight: 55.2 kg 53.3 kg 51.6 kg    Examination: General: more arousable HENT: PERRLA EOMI, NIV off at moment Lungs: Diminished, scant migratory wheeze Cardiovascular: RRR, no murmur Abdomen: SNT +BS Extremities: Trace  edema Neuro: Arousable  Assessment & Plan:  AECOPD NIV switch from AVAPS to BiPAP and remove dead space from circuit on admission ABG corrected with this to chronic compensation levels  Latest Reference Range & Units 10/10/21 17:40  FIO2 % 40  Inspiratory PAP cmH2O 17  Expiratory PAP cmH2O 5  pH, Arterial 7.35 - 7.45  7.38  pCO2 arterial 32 - 48 mmHg 72 (HH)  pO2, Arterial 83 - 108 mmHg 111 (H)  Acid-Base Excess 0.0 - 2.0 mmol/L 14.0 (H)  Bicarbonate 20.0 - 28.0 mmol/L 42.6 (H)  (Maintain aggressive SABA Maintain steroids CTX/AZM Once able will start maintenance inhaled regimen  [facility] from [facility] today and evaluate true oxygen need for SpO2>/=88% Once at liter flow oxygen anticipate transfer to the floor  Best Practice (right click and "Reselect all SmartList Selections" daily)   Diet/type: NPO DVT prophylaxis: LMWH GI prophylaxis: H2B Lines: N/A Foley:  N/A Code Status:  full code  Labs   CBC: Recent Labs  Lab 10/10/21 1019 10/11/21 0515 10/12/21 0411  WBC 13.2* 11.6* 9.5  NEUTROABS 10.5*  --   --   HGB 12.8 11.9* 12.0  HCT 43.3 39.7 38.4  MCV 105.4* 103.4* 100.8*  PLT 211 191 233     Basic Metabolic Panel: Recent Labs  Lab 10/10/21 1019 10/11/21 0515 10/11/21 2009 10/12/21 0411  NA 138  139  --  142  K 3.9 3.5  --  3.9  CL 89* 90*  --  95*  CO2 34* 38*  --  37*  GLUCOSE 260* 131*  --  167*  BUN 32* 31*  --  28*  CREATININE 0.74 0.67  --  0.59  CALCIUM 9.9 9.6  --  8.8*  MG  --  2.5*  --  2.6*  PHOS  --  1.4* 3.4 2.3*    GFR: Estimated Creatinine Clearance: 48.7 mL/min (by C-G formula based on SCr of 0.59 mg/dL). Recent Labs  Lab 10/10/21 1019 10/10/21 1036 10/11/21 0515 10/12/21 0411  PROCALCITON  --  0.26 0.47 0.26  WBC 13.2*  --  11.6* 9.5     Liver Function Tests: Recent Labs  Lab 10/10/21 1019  AST 51*  ALT 25  ALKPHOS 72  BILITOT 0.7  PROT 7.5  ALBUMIN 3.7    No results for input(s): LIPASE, AMYLASE in the last 168  hours. No results for input(s): AMMONIA in the last 168 hours.  ABG    Component Value Date/Time   PHART 7.38 10/10/2021 1740   PCO2ART 72 (HH) 10/10/2021 1740   PO2ART 111 (H) 10/10/2021 1740   HCO3 42.6 (H) 10/10/2021 1740   O2SAT PENDING 10/10/2021 1740      Coagulation Profile: No results for input(s): INR, PROTIME in the last 168 hours.  Cardiac Enzymes: No results for input(s): CKTOTAL, CKMB, CKMBINDEX, TROPONINI in the last 168 hours.  HbA1C: Hemoglobin A1C  Date/Time Value Ref Range Status  10/06/2021 09:54 AM 5.6 4.0 - 5.6 % Final  06/24/2021 02:41 PM 5.6 4.0 - 5.6 % Final   Hgb A1c MFr Bld  Date/Time Value Ref Range Status  03/11/2019 08:58 AM 5.8 (H) 4.8 - 5.6 % Final    Comment:    (NOTE) Pre diabetes:          5.7%-6.4% Diabetes:              >6.4% Glycemic control for   <7.0% adults with diabetes   01/04/2017 03:31 PM 6.1 (H) 4.8 - 5.6 % Final    Comment:    (NOTE) Pre diabetes:          5.7%-6.4% Diabetes:              >6.4% Glycemic control for   <7.0% adults with diabetes     CBG: Recent Labs  Lab 10/11/21 2313 10/12/21 0406 10/12/21 0745 10/12/21 1144 10/12/21 1535  GLUCAP 161* 173* 146* 169* 197*     Review of Systems:   Unable to assess due to BiPAP  Past Medical History:  She,  has a past medical history of Anxiety, Chest pain, CHF (congestive heart failure) ([place]), COPD (chronic obstructive pulmonary disease) ([place]), Depression, Diabetes mellitus without complication ([place]), Fibromyalgia, Graves disease, Hypertension, IBS (irritable bowel syndrome), PTSD (post-traumatic stress disorder), Sinus problem, and Spinal stenosis.   Surgical History:   Past Surgical History:  Procedure Laterality Date   ABDOMINAL HYSTERECTOMY     CAST APPLICATION  [id number]   Procedure: Application of long-leg hinged brace right lower extremity.;  Surgeon: Corky Mull, MD;  Location: [facility] ORS;  Service: Orthopedics;;   ESOPHAGOGASTRODUODENOSCOPY (EGD)  WITH PROPOFOL N/A 01/10/2015   Procedure: ESOPHAGOGASTRODUODENOSCOPY (EGD) WITH PROPOFOL;  Surgeon: Hulen Luster, MD;  Location: [facility] ENDOSCOPY;  Service: Gastroenterology;  Laterality: N/A;   ORIF ELBOW FRACTURE Left 11/19/2020   Procedure: OPEN REDUCTION INTERNAL FIXATION (ORIF) ELBOW/OLECRANON FRACTURE;  Surgeon:  Poggi, Marshall Cork, MD;  Location: [facility] ORS;  Service: Orthopedics;  Laterality: Left;   RECTAL SURGERY     RIGHT/LEFT HEART CATH AND CORONARY ANGIOGRAPHY Bilateral 04/08/2020   Procedure: RIGHT/LEFT HEART CATH AND CORONARY ANGIOGRAPHY;  Surgeon: Wellington Hampshire, MD;  Location: [facility];  Service: Cardiovascular;  Laterality: Bilateral;   SAVORY DILATION N/A 01/10/2015   Procedure: SAVORY DILATION;  Surgeon: Hulen Luster, MD;  Location: [facility] ENDOSCOPY;  Service: Gastroenterology;  Laterality: N/A;   toenail removal Bilateral    great toes   TONSILLECTOMY AND ADENOIDECTOMY       Social History:   reports that she quit smoking about 5 years ago. Her smoking use included cigarettes. She has a 12.50 pack-year smoking history. She has never used smokeless tobacco. She reports that she does not drink alcohol and does not use drugs.   Family History:  Her family history includes Alcohol abuse in her father, mother, and sister; Asthma in her sister; COPD in her sister; Depression in her father, maternal grandmother, mother, and sister; Diabetes in her paternal grandmother and sister; Heart disease in her sister; Hypertension in her sister; Stroke in her paternal grandmother; Varicose Veins in her mother.   Allergies Allergies  Allergen Reactions   Celecoxib     Other reaction(s): Other (qualifier value) Patient is allergic to Celebrex and found that it caused heart failure. CHF   Pregabalin     Other reaction(s): Localized superficial swelling of skin     Home Medications  Prior to Admission medications   Medication Sig Start Date End Date Taking? Authorizing Provider   acetaminophen (TYLENOL) 500 MG tablet Take 1,000 mg by mouth in the morning, at noon, and at bedtime.    [provider]  albuterol (VENTOLIN HFA) 108 (90 Base) MCG/ACT inhaler INAHLE 2 PUFFS INTO THE LUNGS EVERY 6 HOURS AS NEEDED FOR WHEEZING OR SHORTNESS OF BREATH 05/20/21   Jonetta Osgood, NP  alendronate (FOSAMAX) 70 MG tablet Take 1 tablet (70 mg total) by mouth every Tuesday. 03/18/21   Jonetta Osgood, NP  aspirin EC 81 MG tablet Take 81 mg by mouth daily. Swallow whole.    [provider]  Azelastine HCl 137 MCG/SPRAY SOLN Place 2 sprays into both nostrils in the morning and at bedtime. 03/18/21   Jonetta Osgood, NP  azithromycin (ZITHROMAX) 250 MG tablet Take one tab a day for 10 days for uri 10/06/21   Mylinda Latina, PA-C  Ergocalciferol 50 MCG (2000 UT) TABS Take 2 capsules by mouth daily. 03/18/21   Jonetta Osgood, NP  FLUoxetine (PROZAC) 40 MG capsule Take 1 capsule (40 mg total) by mouth daily as needed. 06/24/21   Jonetta Osgood, NP  fluticasone (FLONASE) 50 MCG/ACT nasal spray Place 2 sprays into both nostrils in the morning and at bedtime. 03/18/21   Jonetta Osgood, NP  hydrocortisone 2.5 % cream Place 1 application rectally 2 (two) times daily as needed (itching/hemorroids).     [provider]  ipratropium-albuterol (DUONEB) 0.5-2.5 (3) MG/3ML SOLN USE 1 VIAL IN NEBULIZER 3 TIMES DAILY 08/25/21   Jonetta Osgood, NP  levothyroxine (SYNTHROID) 88 MCG tablet Take 1 tablet (88 mcg total) by mouth daily before breakfast. Take 30 minutes before food and with a full glass of water 10/06/21   McDonough, Lauren K, PA-C  LORazepam (ATIVAN) 1 MG tablet Take 1 tablet (1 mg total) by mouth 2 (two) times daily as needed for anxiety. 10/06/21   McDonough, Si Gaul, PA-C  lubiprostone (AMITIZA) 8 MCG capsule Take 1 capsule (8 mcg total) by mouth daily with breakfast. 03/18/21   Jonetta Osgood, NP  meclizine (ANTIVERT) 25 MG tablet Take 1 tablet (25 mg  total) by mouth 3 (three) times daily as needed for dizziness or nausea. 03/18/21   Jonetta Osgood, NP  mirtazapine (REMERON) 15 MG tablet Take 1 tablet (15 mg total) by mouth at bedtime. 05/29/21   Jonetta Osgood, NP  montelukast (SINGULAIR) 10 MG tablet Take 1 tablet (10 mg total) by mouth at bedtime. 04/22/21   Jonetta Osgood, NP  Multiple Vitamin (MULTIVITAMIN WITH MINERALS) TABS tablet Take 1 tablet by mouth daily. Multivitamin for Seniors    [provider]  ondansetron (ZOFRAN) 4 MG tablet Take 1 tablet (4 mg total) by mouth every 6 (six) hours as needed for nausea. 03/18/21   Jonetta Osgood, NP  OXYGEN Inhale into the lungs. Pt is on 2 liters oxygen 24 hrs uses [place] PATIENT    [provider]  predniSONE (DELTASONE) 10 MG tablet Take one tab 3 x day for 3 days, then take one tab 2 x a day for 3 days and then take one tab a day for 3 days for copd 10/06/21   McDonough, Lauren K, PA-C  rosuvastatin (CRESTOR) 20 MG tablet Take 1 tablet (20 mg total) by mouth daily. 03/18/21   Jonetta Osgood, NP  SYMBICORT 160-4.5 MCG/ACT inhaler Inhale 2 puffs into the lungs 2 (two) times daily. 05/29/21   Jonetta Osgood, NP  tiotropium (SPIRIVA HANDIHALER) 18 MCG inhalation capsule Place 1 capsule (18 mcg total) into inhaler and inhale daily. 03/28/21   Jonetta Osgood, NP  traMADol (ULTRAM) 50 MG tablet Take 1 tablet (50 mg total) by mouth 2 (two) times daily as needed for moderate pain or severe pain. 10/06/21   McDonough, Lauren K, PA-C  enoxaparin (LOVENOX) 40 MG/0.4ML injection Inject 0.4 mLs (40 mg total) into the skin daily. Patient not taking: Reported on 12/14/2020 11/21/20 12/15/20  Lattie Corns, PA-C

## 2021-10-12 NOTE — Consult Note (Signed)
PHARMACY CONSULT NOTE - FOLLOW UP  Pharmacy Consult for Electrolyte Monitoring and Replacement   Recent Labs: Potassium (mmol/L)  Date Value  10/12/2021 3.9  12/27/2013 3.5   Magnesium (mg/dL)  Date Value  16/05/930 2.6 (H)   Calcium (mg/dL)  Date Value  35/57/3220 8.8 (L)   Calcium, Total (mg/dL)  Date Value  25/42/7062 8.8   Albumin (g/dL)  Date Value  37/62/8315 3.7  12/27/2013 3.4   Phosphorus (mg/dL)  Date Value  17/61/6073 2.3 (L)   Sodium (mmol/L)  Date Value  10/12/2021 142  03/26/2020 137  12/27/2013 138     Assessment: Pharmacy has been consulted to monitor and replace electrolytes in 75yo patient admitted with shortness of breath, arriving via EMS with initial pulse ox of 50%. CXR showing possible pneumonia vs CHF.  No IVF currently running.  Goal of Therapy:  Electrolytes WNL  Plan:  - Potassium phosphate IV x 1 dose. - will recheck electrolytes with AM labs  Bettey Costa ,PharmD Clinical Pharmacist 10/12/2021 8:11 AM

## 2021-10-13 ENCOUNTER — Inpatient Hospital Stay: Payer: Medicare Other

## 2021-10-13 LAB — BASIC METABOLIC PANEL
Anion gap: 10 (ref 5–15)
BUN: 21 mg/dL (ref 8–23)
CO2: 35 mmol/L — ABNORMAL HIGH (ref 22–32)
Calcium: 8.5 mg/dL — ABNORMAL LOW (ref 8.9–10.3)
Chloride: 92 mmol/L — ABNORMAL LOW (ref 98–111)
Creatinine, Ser: 0.5 mg/dL (ref 0.44–1.00)
GFR, Estimated: >60 mL/min (ref >60)
Glucose, Bld: 142 mg/dL — ABNORMAL HIGH (ref 70–99)
Potassium: 4 mmol/L (ref 3.5–5.1)
Sodium: 137 mmol/L (ref 135–145)

## 2021-10-13 LAB — BLOOD GAS, ARTERIAL
Acid-Base Excess: 13.7 mmol/L — ABNORMAL HIGH (ref 0.0–2.0)
Bicarbonate: 39.5 mmol/L — ABNORMAL HIGH (ref 20.0–28.0)
O2 Content: 4 L/min
O2 Saturation: 100 %
Patient temperature: 37
pCO2 arterial: 53 mmHg — ABNORMAL HIGH (ref 32–48)
pH, Arterial: 7.48 — ABNORMAL HIGH (ref 7.35–7.45)
pO2, Arterial: 113 mmHg — ABNORMAL HIGH (ref 83–108)

## 2021-10-13 LAB — CBC
HCT: 36 % (ref 36.0–46.0)
Hemoglobin: 11.6 g/dL — ABNORMAL LOW (ref 12.0–15.0)
MCH: 32.1 pg (ref 26.0–34.0)
MCHC: 32.2 g/dL (ref 30.0–36.0)
MCV: 99.7 fL (ref 80.0–100.0)
Platelets: 201 10*3/uL (ref 150–400)
RBC: 3.61 MIL/uL — ABNORMAL LOW (ref 3.87–5.11)
RDW: 12.3 % (ref 11.5–15.5)
WBC: 9.2 10*3/uL (ref 4.0–10.5)
nRBC: 0.3 % — ABNORMAL HIGH (ref 0.0–0.2)

## 2021-10-13 LAB — GLUCOSE, CAPILLARY
Glucose-Capillary: 117 mg/dL — ABNORMAL HIGH (ref 70–99)
Glucose-Capillary: 145 mg/dL — ABNORMAL HIGH (ref 70–99)
Glucose-Capillary: 99 mg/dL (ref 70–99)
Glucose-Capillary: 215 mg/dL — ABNORMAL HIGH (ref 70–99)
Glucose-Capillary: 165 mg/dL — ABNORMAL HIGH (ref 70–99)
Glucose-Capillary: 143 mg/dL — ABNORMAL HIGH (ref 70–99)
Glucose-Capillary: 132 mg/dL — ABNORMAL HIGH (ref 70–99)

## 2021-10-13 LAB — PHOSPHORUS: Phosphorus: 2.1 mg/dL — ABNORMAL LOW (ref 2.5–4.6)

## 2021-10-13 LAB — MAGNESIUM: Magnesium: 2.4 mg/dL (ref 1.7–2.4)

## 2021-10-13 MED ORDER — DEXTROSE 5 % IV SOLN
15.0000 mmol | Freq: Once | INTRAVENOUS | Status: AC
  Administered 2021-10-13: 15 mmol via INTRAVENOUS
  Filled 2021-10-13: qty 5

## 2021-10-13 MED ORDER — SPIRONOLACTONE 25 MG PO TABS
25.0000 mg | ORAL_TABLET | Freq: Every day | ORAL | Status: DC
  Administered 2021-10-13 – 2021-10-15 (×3): 25 mg via ORAL
  Filled 2021-10-13 (×3): qty 1

## 2021-10-13 MED ORDER — FUROSEMIDE 10 MG/ML IJ SOLN
20.0000 mg | Freq: Every day | INTRAMUSCULAR | Status: DC
  Administered 2021-10-13 – 2021-10-14 (×2): 20 mg via INTRAVENOUS
  Filled 2021-10-13: qty 4
  Filled 2021-10-13: qty 2

## 2021-10-13 MED ORDER — PREDNISONE 50 MG PO TABS
50.0000 mg | ORAL_TABLET | Freq: Every day | ORAL | Status: DC
  Administered 2021-10-14 – 2021-10-15 (×2): 50 mg via ORAL
  Filled 2021-10-13 (×2): qty 1

## 2021-10-13 MED ORDER — INSULIN ASPART 100 UNIT/ML IJ SOLN
0.0000 [IU] | Freq: Three times a day (TID) | INTRAMUSCULAR | Status: DC
  Administered 2021-10-14: 11 [IU] via SUBCUTANEOUS
  Administered 2021-10-14 – 2021-10-15 (×2): 3 [IU] via SUBCUTANEOUS
  Filled 2021-10-13 (×3): qty 1

## 2021-10-13 MED ORDER — PREDNISOLONE 5 MG PO TABS
50.0000 mg | ORAL_TABLET | Freq: Every day | ORAL | Status: DC
Start: 2021-10-13 — End: 2021-10-13

## 2021-10-13 MED ORDER — DOXYCYCLINE HYCLATE 100 MG PO TABS
100.0000 mg | ORAL_TABLET | Freq: Two times a day (BID) | ORAL | Status: DC
  Administered 2021-10-13 – 2021-10-15 (×5): 100 mg via ORAL
  Filled 2021-10-13 (×5): qty 1

## 2021-10-13 NOTE — Evaluation (Signed)
Occupational Therapy Evaluation Patient Details Name: [name] MRN: [id number] DOB: 06/27/1945 Today's Date: 10/13/2021   History of Present Illness Pt is a 76 yo F who presented with altered mental status and difficulty breathing. EMS summoned and found her on the scene with sats in the 50s. She was placed on NIV in the ED and found to have a pH 7.1 and pCO2 >> 100. Despite the NIV she remained in that region for several hours, but her wheezing did improve with steroids and nebs. MD assessment includes: Acute alteration in mental status, Bilateral mild to moderate pleural effusions, Bibasilar atelectasis, and Acute Hypercapnic Resp Failure.   Clinical Impression   Pt seen for OT evaluation this date. Pt A&Ox4, however endorsed that her cognition is different from baseline (declined to participate in formall cognitive assessment this date). Pt's daughter present throughout session to confirm PLOF. Prior to admission, pt was independent in most ADLs (requires assistance from PCA for bathing in tub/shower) and mod-independent with rollator for functional mobility, living in an apartment with elevator access alone. Pt's PCA was assisting her 5x/week for bathing and IADLs. Pt on 2 liters of O2 at home. Pt currently requires SUPERVISION for bed mobility, SUPERVISION/SET-UP for [facility] transfers/peri-care, and SUPERVISION/SET-UP for seated grooming tasks d/t decreased activity tolerance. While on 4L/min, SpO2>92% throughout session. Pt would benefit from additional skilled OT services to maximize return to PLOF and minimize risk of future falls, injury, caregiver burden, and readmission. Upon discharge, recommend HHOT services.       Recommendations for follow up therapy are one component of a multi-disciplinary discharge planning process, led by the attending physician.  Recommendations may be updated based on patient status, additional functional criteria and insurance authorization.   Follow Up  Recommendations  Home health OT    Assistance Recommended at Discharge Intermittent Supervision/Assistance  Patient can return home with the following A little help with walking and/or transfers;A little help with bathing/dressing/bathroom;Assistance with cooking/housework;Direct supervision/assist for medications management;Assist for transportation    Functional Status Assessment  Patient has had a recent decline in their functional status and demonstrates the ability to make significant improvements in function in a reasonable and predictable amount of time.  Equipment Recommendations  None recommended by OT       Precautions / Restrictions Precautions Precautions: Fall Restrictions Weight Bearing Restrictions: No      Mobility Bed Mobility Overal bed mobility: Modified Independent             General bed mobility comments: Extra time and effort only    Transfers Overall transfer level: Needs assistance Equipment used: Rolling walker (2 wheels) Transfers: Sit to/from Stand Sit to Stand: Supervision                  Balance Overall balance assessment: Needs assistance Sitting-balance support: No upper extremity supported, Feet supported Sitting balance-Leahy Scale: Good Sitting balance - Comments: Good dynamic sitting balance while seated on BSC   Standing balance support: Bilateral upper extremity supported, During functional activity Standing balance-Leahy Scale: Good                             ADL either performed or assessed with clinical judgement   ADL Overall ADL's : Needs assistance/impaired     Grooming: Wash/dry hands;Set up;Sitting                   Toilet Transfer: Supervision/safety;Ambulation;BSC/3in1;Rolling walker (2 wheels) Toilet  Transfer Details (indicate cue type and reason): Requires verbal cues for safe UE placement with RW use Toileting- Clothing Manipulation and Hygiene: Supervision/safety;Set  up;Sitting/lateral lean       Functional mobility during ADLs: Min guard;Rolling walker (2 wheels)       Vision Baseline Vision/History: 1 Wears glasses Ability to See in Adequate Light: 0 Adequate Patient Visual Report: No change from baseline              Pertinent Vitals/Pain Pain Assessment Pain Assessment: No/denies pain     Hand Dominance Right   Extremity/Trunk Assessment Upper Extremity Assessment Upper Extremity Assessment: Generalized weakness   Lower Extremity Assessment Lower Extremity Assessment: Generalized weakness       Communication Communication Communication: No difficulties   Cognition Arousal/Alertness: Awake/alert Behavior During Therapy: WFL for tasks assessed/performed Overall Cognitive Status: Impaired/Different from baseline                                 General Comments: Pt A&Ox4, however endorses that her cognition is different from baseline. Declined to participate in formall cognitive assessment this date     General Comments  While on 4L/min, SpO2>92% throughout session            Home Living Family/patient expects to be discharged to:: Private residence Living Arrangements: Alone Available Help at Discharge: Personal care attendant;Family;Available PRN/intermittently (has a PCA that comes 5x/week. Daughter available PRN) Type of Home: Apartment Home Access: Elevator     Home Layout: One level     Bathroom Shower/Tub: Chief Strategy OfficerTub/shower unit   Bathroom Toilet: Handicapped height     Home Equipment: BSC/3in1;Rollator (4 wheels);Shower seat;Grab bars - tub/shower;Wheelchair - manual;Other (comment)   Additional Comments: Uses 2L of supplemental O2 at baseline      Prior Functioning/Environment Prior Level of Function : Needs assist             Mobility Comments: Mod Ind amb limited community distances with a rollator, no fall history ADLs Comments: Independent with grooming, dressing, and  sponge-bathing. PCA assists with bathing in tub 3x/week and housework        OT Problem List: Decreased strength;Decreased activity tolerance      OT Treatment/Interventions: Self-care/ADL training;Neuromuscular education;Energy conservation;DME and/or AE instruction;Therapeutic activities;Patient/family education    OT Goals(Current goals can be found in the care plan section) Acute Rehab OT Goals Patient Stated Goal: to return home when safe to do so OT Goal Formulation: With patient Time For Goal Achievement: 10/27/21 Potential to Achieve Goals: Good ADL Goals Pt Will Perform Grooming: with modified independence;standing (for 5 mins while implementing at least 2 energy conservation strategies) Pt Will Perform Lower Body Dressing: with modified independence;sit to/from stand Pt Will Transfer to Toilet: with modified independence;ambulating;regular height toilet  OT Frequency: Min 2X/week       AM-PAC OT "6 Clicks" Daily Activity     Outcome Measure Help from another person eating meals?: None Help from another person taking care of personal grooming?: A Little Help from another person toileting, which includes using toliet, bedpan, or urinal?: A Little Help from another person bathing (including washing, rinsing, drying)?: A Little Help from another person to put on and taking off regular upper body clothing?: A Little Help from another person to put on and taking off regular lower body clothing?: A Little 6 Click Score: 19   End of Session Equipment Utilized During Treatment: Gait belt;Rolling walker (2 wheels);Oxygen  Nurse Communication: Mobility status  Activity Tolerance: Patient tolerated treatment well Patient left: in chair;with call bell/phone within reach;with family/visitor present  OT Visit Diagnosis: Muscle weakness (generalized) (M62.81)                Time: 7341-9379 OT Time Calculation (min): 20 min Charges:  OT General Charges $OT Visit: 1 Visit OT  Evaluation $OT Eval Moderate Complexity: 1 Mod OT Treatments $Self Care/Home Management : 8-22 mins  Matthew Folks, OTR/L [facility] [phone]

## 2021-10-13 NOTE — Evaluation (Signed)
Physical Therapy Evaluation Patient Details Name: [name] MRN: [id number] DOB: 1946/01/06 Today's Date: 10/13/2021  History of Present Illness  Pt is a 76 yo WF presented with altered mental status and difficulty breathing. EMS summoned and found her on the scene with sats in the 50s. She was placed on NIV in the ED and found to have a pH 7.1 and pCO2 >> 100. Despite the NIV she remained in that region for several hours, but her wheezing did improve with steroids and nebs. MD assessment includes: Acute alteration in mental status, Bilateral mild to moderate pleural effusions, Bibasilar atelectasis, and Acute Hypercapnic Resp Failure.   Clinical Impression  Pt was pleasant and motivated to participate during the session and put forth good effort throughout. Pt required no physical assistance during the session but did require min extra time and effort with tasks and presented with limited activity tolerance with gait.  Pt was able to ambulate limited distances without adverse symptoms, however, and her SpO2 and HR were both WNL on 4LO2/min.  Pt will benefit from HHPT upon discharge to safely address deficits listed in patient problem list for decreased caregiver assistance and eventual return to PLOF.          Recommendations for follow up therapy are one component of a multi-disciplinary discharge planning process, led by the attending physician.  Recommendations may be updated based on patient status, additional functional criteria and insurance authorization.  Follow Up Recommendations Home health PT    Assistance Recommended at Discharge Frequent or constant Supervision/Assistance  Patient can return home with the following  A little help with walking and/or transfers;A little help with bathing/dressing/bathroom;Assistance with cooking/housework;Assist for transportation    Equipment Recommendations Rolling walker (2 wheels)  Recommendations for Other Services       Functional  Status Assessment Patient has had a recent decline in their functional status and demonstrates the ability to make significant improvements in function in a reasonable and predictable amount of time.     Precautions / Restrictions Precautions Precautions: Fall Restrictions Weight Bearing Restrictions: No      Mobility  Bed Mobility Overal bed mobility: Modified Independent             General bed mobility comments: Extra time and effort only    Transfers Overall transfer level: Needs assistance Equipment used: Rolling walker (2 wheels) Transfers: Sit to/from Stand Sit to Stand: Min guard           General transfer comment: Fair eccentric and concentric control and stability    Ambulation/Gait Ambulation/Gait assistance: Min guard Gait Distance (Feet): 15 Feet Assistive device: Rolling walker (2 wheels) Gait Pattern/deviations: Step-through pattern, Decreased step length - right, Decreased step length - left Gait velocity: decreased     General Gait Details: Slow cadence but steady without LOB with SpO2 and HR WNL on 4LO2/min  Stairs            Wheelchair Mobility    Modified Rankin (Stroke Patients Only)       Balance Overall balance assessment: Needs assistance   Sitting balance-Leahy Scale: Good     Standing balance support: Bilateral upper extremity supported, During functional activity Standing balance-Leahy Scale: Good                               Pertinent Vitals/Pain Pain Assessment Pain Assessment: 0-10 Pain Score: 6  Pain Location: gen body pain Pain Descriptors / Indicators:  Sore Pain Intervention(s): Repositioned, Premedicated before session, Monitored during session, Patient requesting pain meds-RN notified    Home Living Family/patient expects to be discharged to:: Private residence Living Arrangements: Alone Available Help at Discharge: Personal care attendant;Available 24 hours/day Type of Home:  Apartment Home Access: Elevator       Home Layout: One level Home Equipment: BSC/3in1;Rollator (4 wheels);Wheelchair - manual      Prior Function Prior Level of Function : Needs assist             Mobility Comments: Mod Ind amb limited community distances with a rollator, no fall history ADLs Comments: PCA assists with bathing and housework, Ind with dressing     Hand Dominance   Dominant Hand: Right    Extremity/Trunk Assessment   Upper Extremity Assessment Upper Extremity Assessment: Generalized weakness    Lower Extremity Assessment Lower Extremity Assessment: Generalized weakness       Communication   Communication: No difficulties  Cognition Arousal/Alertness: Awake/alert Behavior During Therapy: WFL for tasks assessed/performed Overall Cognitive Status: Within Functional Limits for tasks assessed                                          General Comments      Exercises Total Joint Exercises Ankle Circles/Pumps: AROM, Strengthening, Both, 10 reps Quad Sets: 10 reps, Both, Strengthening Gluteal Sets: Strengthening, Both, 10 reps Long Arc Quad: Strengthening, Both, 10 reps Other Exercises Other Exercises: HEP education for BLE APs, QS, and GS   Assessment/Plan    PT Assessment Patient needs continued PT services  PT Problem List Decreased strength;Decreased activity tolerance;Decreased balance;Decreased mobility;Decreased knowledge of use of DME       PT Treatment Interventions DME instruction;Gait training;Functional mobility training;Therapeutic activities;Therapeutic exercise;Balance training;Patient/family education    PT Goals (Current goals can be found in the Care Plan section)  Acute Rehab PT Goals Patient Stated Goal: To feel better PT Goal Formulation: With patient Time For Goal Achievement: 10/26/21 Potential to Achieve Goals: Good    Frequency Min 2X/week     Co-evaluation               AM-PAC PT "6  Clicks" Mobility  Outcome Measure Help needed turning from your back to your side while in a flat bed without using bedrails?: A Little Help needed moving from lying on your back to sitting on the side of a flat bed without using bedrails?: A Little Help needed moving to and from a bed to a chair (including a wheelchair)?: A Little Help needed standing up from a chair using your arms (e.g., wheelchair or bedside chair)?: A Little Help needed to walk in hospital room?: A Little Help needed climbing 3-5 steps with a railing? : A Little 6 Click Score: 18    End of Session Equipment Utilized During Treatment: Gait belt;Oxygen Activity Tolerance: Patient tolerated treatment well Patient left: in chair;with call bell/phone within reach Nurse Communication: Mobility status PT Visit Diagnosis: Difficulty in walking, not elsewhere classified (R26.2);Muscle weakness (generalized) (M62.81)    Time: 2482-5003 PT Time Calculation (min) (ACUTE ONLY): 31 min   Charges:   PT Evaluation $PT Eval Moderate Complexity: 1 Mod PT Treatments $Therapeutic Exercise: 8-22 mins        D. Scott Nikelle Malatesta PT, DPT 10/13/21, 11:48 AM

## 2021-10-13 NOTE — Consult Note (Signed)
PHARMACY CONSULT NOTE  Pharmacy Consult for Electrolyte Monitoring and Replacement   Recent Labs: Potassium (mmol/L)  Date Value  10/13/2021 4.0  12/27/2013 3.5   Magnesium (mg/dL)  Date Value  67/20/9470 2.4   Calcium (mg/dL)  Date Value  96/28/3662 8.5 (L)   Calcium, Total (mg/dL)  Date Value  94/76/5465 8.8   Albumin (g/dL)  Date Value  03/54/6568 3.7  12/27/2013 3.4   Phosphorus (mg/dL)  Date Value  12/75/1700 2.1 (L)   Sodium (mmol/L)  Date Value  10/13/2021 137  03/26/2020 137  12/27/2013 138     Assessment: Pharmacy has been consulted to monitor and replace electrolytes in 76yo patient admitted with shortness of breath, arriving via EMS with initial pulse ox of 50%. CXR showing possible pneumonia vs CHF. -hx CHF No IVF currently running.  Goal of Therapy:  Electrolytes WNL  Plan:  - Phos 2.1  K 4.0  Na 137    -will order Potassium phosphate IV x 1 dose(contains 22 mEq IV potassium).  Dose=  0.48mmol/kg, pt wt= 54 kg) -watch CHF/fluid/Na - will recheck electrolytes with AM labs  Angelique Blonder ,PharmD Clinical Pharmacist 10/13/2021 8:39 AM

## 2021-10-13 NOTE — Progress Notes (Signed)
NAME:  [name], MRN:  [id number], DOB:  27-Nov-1945, LOS: 3 ADMISSION DATE:  10/10/2021, CONSULTATION DATE:  10/10/2021 REFERRING MD:  Dr. Darnelle Catalan, CHIEF COMPLAINT:  Shortness of breath  Brief Pt Description / Synopsis:  Acute alteration in mental status Acute Hypercapnic Resp Failure  History of Present Illness:  76 yo WF presented with altered mental status and difficulty breathing. EMS summoned and found her on the scene with sats in the 50s. She was placed on NIV in the ED and found to have a pH 7.1 and pCO2 >> 100. Despite the NIV she remained in that region for several hours, but her wheezing did improve with steroids and nebs.   10/13/21- Patient seen and examined at bedside, she is lucid and oriented to person, place and day.  She is with chronic CO2 retention and is at risk of recurrent hypercapnic encephalopathy.  Today we are optimizing for downgrade from SDU.  Plan for ABG with order for BIPAP qualification.  Will need outpatient pulmonary evaluation and follow up. CXR this am.  PT/OT in process.    Pertinent  Medical History   Past Medical History:  Diagnosis Date   Anxiety    Chest pain    CHF (congestive heart failure) (HCC)    COPD (chronic obstructive pulmonary disease) (HCC)    Depression    Diabetes mellitus without complication (HCC)    Fibromyalgia    Graves disease    Hypertension    IBS (irritable bowel syndrome)    PTSD (post-traumatic stress disorder)    Sinus problem    Spinal stenosis      Micro Data:  NG  Antimicrobials:  CTX AZM  Significant Hospital Events: Including procedures, antibiotic start and stop dates in addition to other pertinent events   NIV   Objective   Blood pressure 133/67, pulse 73, temperature 97.9 F (36.6 C), temperature source Axillary, resp. rate 20, height  (1.676 m), weight 54 kg, SpO2 100 %.    FiO2 (%):  [35 %-40 %] 35 %   Intake/Output Summary (Last 24 hours) at 10/13/2021 0813 Last data filed at  10/13/2021 0400 Gross per 24 hour  Intake 1311.13 ml  Output 525 ml  Net 786.13 ml    Filed Weights   10/11/21 0400 10/12/21 0500 10/13/21 0400  Weight: 53.3 kg 51.6 kg 54 kg    Examination: General: more arousable HENT: PERRLA EOMI, NIV off at moment Lungs: Diminished, scant migratory wheeze Cardiovascular: RRR, no murmur Abdomen: SNT +BS Extremities: Trace edema Neuro: Arousable  Assessment & Plan:  AECOPD NIV switch from AVAPS to BiPAP and remove dead space from circuit on admission ABG corrected with this to chronic compensation levels  Latest Reference Range & Units 10/10/21 17:40  FIO2 % 40  Inspiratory PAP cmH2O 17  Expiratory PAP cmH2O 5  pH, Arterial 7.35 - 7.45  7.38  pCO2 arterial 32 - 48 mmHg 72 (HH)  pO2, Arterial 83 - 108 mmHg 111 (H)  Acid-Base Excess 0.0 - 2.0 mmol/L 14.0 (H)  Bicarbonate 20.0 - 28.0 mmol/L 42.6 (H)    Best Practice (right click and "Reselect all SmartList Selections" daily)   Diet/type: NPO DVT prophylaxis: LMWH GI prophylaxis: H2B Lines: N/A Foley:  N/A Code Status:  full code  Labs   CBC: Recent Labs  Lab 10/10/21 1019 10/11/21 0515 10/12/21 0411 10/13/21 0511  WBC 13.2* 11.6* 9.5 9.2  NEUTROABS 10.5*  --   --   --   HGB  12.8 11.9* 12.0 11.6*  HCT 43.3 39.7 38.4 36.0  MCV 105.4* 103.4* 100.8* 99.7  PLT 211 191 233 201     Basic Metabolic Panel: Recent Labs  Lab 10/10/21 1019 10/11/21 0515 10/11/21 2009 10/12/21 0411 10/13/21 0511  NA 138 139  --  142 137  K 3.9 3.5  --  3.9 4.0  CL 89* 90*  --  95* 92*  CO2 34* 38*  --  37* 35*  GLUCOSE 260* 131*  --  167* 142*  BUN 32* 31*  --  28* 21  CREATININE 0.74 0.67  --  0.59 0.50  CALCIUM 9.9 9.6  --  8.8* 8.5*  MG  --  2.5*  --  2.6* 2.4  PHOS  --  1.4* 3.4 2.3* 2.1*    GFR: Estimated Creatinine Clearance: 51 mL/min (by C-G formula based on SCr of 0.5 mg/dL). Recent Labs  Lab 10/10/21 1019 10/10/21 1036 10/11/21 0515 10/12/21 0411 10/13/21 0511   PROCALCITON  --  0.26 0.47 0.26  --   WBC 13.2*  --  11.6* 9.5 9.2     Liver Function Tests: Recent Labs  Lab 10/10/21 1019  AST 51*  ALT 25  ALKPHOS 72  BILITOT 0.7  PROT 7.5  ALBUMIN 3.7    No results for input(s): LIPASE, AMYLASE in the last 168 hours. No results for input(s): AMMONIA in the last 168 hours.  ABG    Component Value Date/Time   PHART 7.38 10/10/2021 1740   PCO2ART 72 (HH) 10/10/2021 1740   PO2ART 111 (H) 10/10/2021 1740   HCO3 42.6 (H) 10/10/2021 1740   O2SAT PENDING 10/10/2021 1740      Coagulation Profile: No results for input(s): INR, PROTIME in the last 168 hours.  Cardiac Enzymes: No results for input(s): CKTOTAL, CKMB, CKMBINDEX, TROPONINI in the last 168 hours.  HbA1C: Hemoglobin A1C  Date/Time Value Ref Range Status  10/06/2021 09:54 AM 5.6 4.0 - 5.6 % Final  06/24/2021 02:41 PM 5.6 4.0 - 5.6 % Final   Hgb A1c MFr Bld  Date/Time Value Ref Range Status  03/11/2019 08:58 AM 5.8 (H) 4.8 - 5.6 % Final    Comment:    (NOTE) Pre diabetes:          5.7%-6.4% Diabetes:              >6.4% Glycemic control for   <7.0% adults with diabetes   01/04/2017 03:31 PM 6.1 (H) 4.8 - 5.6 % Final    Comment:    (NOTE) Pre diabetes:          5.7%-6.4% Diabetes:              >6.4% Glycemic control for   <7.0% adults with diabetes     CBG: Recent Labs  Lab 10/12/21 1535 10/12/21 1912 10/12/21 2307 10/13/21 0339 10/13/21 0716  GLUCAP 197* 226* 160* 145* 99     Review of Systems:   10 point ROS done and is negative except as per Subjective findings and HPI  Past Medical History:  She,  has a past medical history of Anxiety, Chest pain, CHF (congestive heart failure) (HCC), COPD (chronic obstructive pulmonary disease) (HCC), Depression, Diabetes mellitus without complication (HCC), Fibromyalgia, Graves disease, Hypertension, IBS (irritable bowel syndrome), PTSD (post-traumatic stress disorder), Sinus problem, and Spinal stenosis.    Surgical History:   Past Surgical History:  Procedure Laterality Date   ABDOMINAL HYSTERECTOMY     CAST APPLICATION  11/19/2020   Procedure: Application  of long-leg hinged brace right lower extremity.;  Surgeon: Christena Flake, MD;  Location: [facility] ORS;  Service: Orthopedics;;   ESOPHAGOGASTRODUODENOSCOPY (EGD) WITH PROPOFOL N/A 01/10/2015   Procedure: ESOPHAGOGASTRODUODENOSCOPY (EGD) WITH PROPOFOL;  Surgeon: Wallace Cullens, MD;  Location: [facility] ENDOSCOPY;  Service: Gastroenterology;  Laterality: N/A;   ORIF ELBOW FRACTURE Left 11/19/2020   Procedure: OPEN REDUCTION INTERNAL FIXATION (ORIF) ELBOW/OLECRANON FRACTURE;  Surgeon: Christena Flake, MD;  Location: [facility] ORS;  Service: Orthopedics;  Laterality: Left;   RECTAL SURGERY     RIGHT/LEFT HEART CATH AND CORONARY ANGIOGRAPHY Bilateral 04/08/2020   Procedure: RIGHT/LEFT HEART CATH AND CORONARY ANGIOGRAPHY;  Surgeon: Iran Ouch, MD;  Location: [facility] INVASIVE CV LAB;  Service: Cardiovascular;  Laterality: Bilateral;   SAVORY DILATION N/A 01/10/2015   Procedure: SAVORY DILATION;  Surgeon: Wallace Cullens, MD;  Location: [facility] ENDOSCOPY;  Service: Gastroenterology;  Laterality: N/A;   toenail removal Bilateral    great toes   TONSILLECTOMY AND ADENOIDECTOMY       Social History:   reports that she quit smoking about 5 years ago. Her smoking use included cigarettes. She has a 12.50 pack-year smoking history. She has never used smokeless tobacco. She reports that she does not drink alcohol and does not use drugs.   Family History:  Her family history includes Alcohol abuse in her father, mother, and sister; Asthma in her sister; COPD in her sister; Depression in her father, maternal grandmother, mother, and sister; Diabetes in her paternal grandmother and sister; Heart disease in her sister; Hypertension in her sister; Stroke in her paternal grandmother; Varicose Veins in her mother.   Allergies Allergies  Allergen Reactions   Celecoxib     Other  reaction(s): Other (qualifier value) Patient is allergic to Celebrex and found that it caused heart failure. CHF   Pregabalin     Other reaction(s): Localized superficial swelling of skin     Home Medications  Prior to Admission medications   Medication Sig Start Date End Date Taking? Authorizing Provider  acetaminophen (TYLENOL) 500 MG tablet Take 1,000 mg by mouth in the morning, at noon, and at bedtime.    [provider]  albuterol (VENTOLIN HFA) 108 (90 Base) MCG/ACT inhaler INAHLE 2 PUFFS INTO THE LUNGS EVERY 6 HOURS AS NEEDED FOR WHEEZING OR SHORTNESS OF BREATH 05/20/21   Sallyanne Kuster, NP  alendronate (FOSAMAX) 70 MG tablet Take 1 tablet (70 mg total) by mouth every Tuesday. 03/18/21   Sallyanne Kuster, NP  aspirin EC 81 MG tablet Take 81 mg by mouth daily. Swallow whole.    [provider]  Azelastine HCl 137 MCG/SPRAY SOLN Place 2 sprays into both nostrils in the morning and at bedtime. 03/18/21   Sallyanne Kuster, NP  azithromycin (ZITHROMAX) 250 MG tablet Take one tab a day for 10 days for uri 10/06/21   Carlean Jews, PA-C  Ergocalciferol 50 MCG (2000 UT) TABS Take 2 capsules by mouth daily. 03/18/21   Sallyanne Kuster, NP  FLUoxetine (PROZAC) 40 MG capsule Take 1 capsule (40 mg total) by mouth daily as needed. 06/24/21   Sallyanne Kuster, NP  fluticasone (FLONASE) 50 MCG/ACT nasal spray Place 2 sprays into both nostrils in the morning and at bedtime. 03/18/21   Sallyanne Kuster, NP  hydrocortisone 2.5 % cream Place 1 application rectally 2 (two) times daily as needed (itching/hemorroids).     [provider]  ipratropium-albuterol (DUONEB) 0.5-2.5 (3) MG/3ML SOLN USE 1 VIAL IN NEBULIZER 3 TIMES DAILY 08/25/21  Sallyanne KusterAbernathy, Alyssa, NP  levothyroxine (SYNTHROID) 88 MCG tablet Take 1 tablet (88 mcg total) by mouth daily before breakfast. Take 30 minutes before food and with a full glass of water 10/06/21   McDonough, Lauren K, PA-C  LORazepam (ATIVAN) 1  MG tablet Take 1 tablet (1 mg total) by mouth 2 (two) times daily as needed for anxiety. 10/06/21   McDonough, Salomon FickLauren K, PA-C  lubiprostone (AMITIZA) 8 MCG capsule Take 1 capsule (8 mcg total) by mouth daily with breakfast. 03/18/21   Sallyanne KusterAbernathy, Alyssa, NP  meclizine (ANTIVERT) 25 MG tablet Take 1 tablet (25 mg total) by mouth 3 (three) times daily as needed for dizziness or nausea. 03/18/21   Sallyanne KusterAbernathy, Alyssa, NP  mirtazapine (REMERON) 15 MG tablet Take 1 tablet (15 mg total) by mouth at bedtime. 05/29/21   Sallyanne KusterAbernathy, Alyssa, NP  montelukast (SINGULAIR) 10 MG tablet Take 1 tablet (10 mg total) by mouth at bedtime. 04/22/21   Sallyanne KusterAbernathy, Alyssa, NP  Multiple Vitamin (MULTIVITAMIN WITH MINERALS) TABS tablet Take 1 tablet by mouth daily. Multivitamin for Seniors    [provider]  ondansetron (ZOFRAN) 4 MG tablet Take 1 tablet (4 mg total) by mouth every 6 (six) hours as needed for nausea. 03/18/21   Sallyanne KusterAbernathy, Alyssa, NP  OXYGEN Inhale into the lungs. Pt is on 2 liters oxygen 24 hrs uses [facility]    [provider]  predniSONE (DELTASONE) 10 MG tablet Take one tab 3 x day for 3 days, then take one tab 2 x a day for 3 days and then take one tab a day for 3 days for copd 10/06/21   McDonough, Lauren K, PA-C  rosuvastatin (CRESTOR) 20 MG tablet Take 1 tablet (20 mg total) by mouth daily. 03/18/21   Sallyanne KusterAbernathy, Alyssa, NP  SYMBICORT 160-4.5 MCG/ACT inhaler Inhale 2 puffs into the lungs 2 (two) times daily. 05/29/21   Sallyanne KusterAbernathy, Alyssa, NP  tiotropium (SPIRIVA HANDIHALER) 18 MCG inhalation capsule Place 1 capsule (18 mcg total) into inhaler and inhale daily. 03/28/21   Sallyanne KusterAbernathy, Alyssa, NP  traMADol (ULTRAM) 50 MG tablet Take 1 tablet (50 mg total) by mouth 2 (two) times daily as needed for moderate pain or severe pain. 10/06/21   McDonough, Lauren K, PA-C  enoxaparin (LOVENOX) 40 MG/0.4ML injection Inject 0.4 mLs (40 mg total) into the skin daily. Patient not taking: Reported on  12/14/2020 11/21/20 12/15/20  Anson OregonMcGhee, James Lance, PA-C      ASSESSMENT AND PLAN     Acute on chronic hypoxemic and hypercarbic respiratory failure   Patient with advanced copd and centrilobular emphysema   - overlying pulmonary edema and previous CT showing bilateral moderate effusions with atelectasis  - repeat ABG today   - repeat CXR today  - start PT/OT   - optimize medically for downgrade from SDU  - diurese interstitial edema -patient does have allergy listed to loop diuretics, however states she has had Lasix in the past we will try low-dose 20 mg with Aldactone 25 once daily p.o. today.  Plan to wean down O2 as able once interstitial edema is improved. -Can reduce Solu-Medrol 20 mg IV twice daily to prednisone taper p.o. -Qualification for BiPAP on outpatient -Query regarding cardiac status we will repeat transthoracic echo-reviewed previous echo from 2022 with surprisingly normal metrics-patient was previously [facility] -Currently being treated with Zithromax and Rocephin for empiric CAP   Bilateral mild to moderate pleural effusions    -Diuresis in progress with  Lasix and Aldactone  -strict I's and O's  Bibasilar atelectasis Contributing to increased O2 requirement-need to improve bronchopulmonary hygiene with incentive spirometry and flutter valve. -Initiating physical therapy and Occupational Therapy today -Will likely need pulmonary rehab on outpatient   Graves' disease Continue thyroid replacement  GI/DVT prophylaxis -On Lovenox 40 mg -No PPI  Critical care provider statement:   Total critical care time: 33 minutes   Performed by: Karna Christmas MD   Critical care time was exclusive of separately billable procedures and treating other patients.   Critical care was necessary to treat or prevent imminent or life-threatening deterioration.   Critical care was time spent personally by me on the following activities: development  of treatment plan with patient and/or surrogate as well as nursing, discussions with consultants, evaluation of patient's response to treatment, examination of patient, obtaining history from patient or surrogate, ordering and performing treatments and interventions, ordering and review of laboratory studies, ordering and review of radiographic studies, pulse oximetry and re-evaluation of patient's condition.    Vida Rigger, M.D.  Pulmonary & Critical Care Medicine

## 2021-10-13 NOTE — Consult Note (Signed)
PHARMACY CONSULT NOTE  Pharmacy Consult for Electrolyte Monitoring and Replacement   Recent Labs: Potassium (mmol/L)  Date Value  10/13/2021 4.0  12/27/2013 3.5   Magnesium (mg/dL)  Date Value  56/38/7564 2.4   Calcium (mg/dL)  Date Value  33/29/5188 8.5 (L)   Calcium, Total (mg/dL)  Date Value  41/66/0630 8.8   Albumin (g/dL)  Date Value  16/05/930 3.7  12/27/2013 3.4   Phosphorus (mg/dL)  Date Value  35/57/3220 2.1 (L)   Sodium (mmol/L)  Date Value  10/13/2021 137  03/26/2020 137  12/27/2013 138     Assessment: Pharmacy has been consulted to monitor and replace electrolytes in 75yo patient admitted with shortness of breath, arriving via EMS with initial pulse ox of 50%. CXR showing possible pneumonia vs CHF.  No IVF currently running.  Goal of Therapy:  Electrolytes WNL  Plan:  - Potassium phosphate IV x 1 dose per NP (contains 22 mEq IV potassium) - will recheck electrolytes with AM labs  [name] ,PharmD Clinical Pharmacist 10/13/2021 7:54 AM

## 2021-10-14 ENCOUNTER — Inpatient Hospital Stay
Admit: 2021-10-14 | Discharge: 2021-10-14 | Disposition: A | Discharge status: Home / Self Care | Payer: Medicare Other | Attending: Pulmonary Disease | Admitting: Pulmonary Disease

## 2021-10-14 DIAGNOSIS — I5032 Chronic diastolic (congestive) heart failure: Secondary | ICD-10-CM

## 2021-10-14 DIAGNOSIS — K219 Gastro-esophageal reflux disease without esophagitis: Secondary | ICD-10-CM

## 2021-10-14 DIAGNOSIS — F99 Mental disorder, not otherwise specified: Secondary | ICD-10-CM

## 2021-10-14 LAB — BASIC METABOLIC PANEL
Anion gap: 8 (ref 5–15)
BUN: 15 mg/dL (ref 8–23)
CO2: 37 mmol/L — ABNORMAL HIGH (ref 22–32)
Calcium: 9 mg/dL (ref 8.9–10.3)
Chloride: 93 mmol/L — ABNORMAL LOW (ref 98–111)
Creatinine, Ser: 0.57 mg/dL (ref 0.44–1.00)
GFR, Estimated: >60 mL/min (ref >60)
Glucose, Bld: 124 mg/dL — ABNORMAL HIGH (ref 70–99)
Potassium: 4.5 mmol/L (ref 3.5–5.1)
Sodium: 138 mmol/L (ref 135–145)

## 2021-10-14 LAB — BLOOD GAS, ARTERIAL
Acid-Base Excess: 14 mmol/L — ABNORMAL HIGH (ref 0.0–2.0)
Bicarbonate: 42.6 mmol/L — ABNORMAL HIGH (ref 20.0–28.0)
Delivery systems: POSITIVE
Expiratory PAP: 5 cmH2O
FIO2: 40 %
Inspiratory PAP: 17 cmH2O
Patient temperature: 37
pCO2 arterial: 72 mmHg (ref 32–48)
pH, Arterial: 7.38 (ref 7.35–7.45)
pO2, Arterial: 111 mmHg — ABNORMAL HIGH (ref 83–108)

## 2021-10-14 LAB — ECHOCARDIOGRAM COMPLETE
AR max vel: 1.34 cm2
AV Area VTI: 1.73 cm2
AV Area mean vel: 1.34 cm2
AV Mean grad: 7 mmHg
AV Peak grad: 14.1 mmHg
Ao pk vel: 1.88 m/s
Area-P 1/2: 2.93 cm2
Height: 66 in
MV VTI: 1.34 cm2
S' Lateral: 2.39 cm
Weight: 1887.14 oz

## 2021-10-14 LAB — CBC
HCT: 40.3 % (ref 36.0–46.0)
Hemoglobin: 12.8 g/dL (ref 12.0–15.0)
MCH: 31.1 pg (ref 26.0–34.0)
MCHC: 31.8 g/dL (ref 30.0–36.0)
MCV: 98.1 fL (ref 80.0–100.0)
Platelets: 219 10*3/uL (ref 150–400)
RBC: 4.11 MIL/uL (ref 3.87–5.11)
RDW: 12.3 % (ref 11.5–15.5)
WBC: 10.7 10*3/uL — ABNORMAL HIGH (ref 4.0–10.5)
nRBC: 0.2 % (ref 0.0–0.2)

## 2021-10-14 LAB — LEGIONELLA PNEUMOPHILA SEROGP 1 UR AG: L. pneumophila Serogp 1 Ur Ag: NEGATIVE

## 2021-10-14 LAB — GLUCOSE, CAPILLARY
Glucose-Capillary: 115 mg/dL — ABNORMAL HIGH (ref 70–99)
Glucose-Capillary: 157 mg/dL — ABNORMAL HIGH (ref 70–99)
Glucose-Capillary: 329 mg/dL — ABNORMAL HIGH (ref 70–99)
Glucose-Capillary: 348 mg/dL — ABNORMAL HIGH (ref 70–99)

## 2021-10-14 LAB — PHOSPHORUS: Phosphorus: 2.4 mg/dL — ABNORMAL LOW (ref 2.5–4.6)

## 2021-10-14 MED ORDER — CALCIUM CARBONATE ANTACID 500 MG PO CHEW: 2.0000 | CHEWABLE_TABLET | Freq: Two times a day (BID) | ORAL | Status: DC | PRN

## 2021-10-14 MED ORDER — K PHOS MONO-SOD PHOS DI & MONO 155-852-130 MG PO TABS
500.0000 mg | ORAL_TABLET | ORAL | Status: AC
  Administered 2021-10-14 (×2): 500 mg via ORAL
  Filled 2021-10-14 (×2): qty 2

## 2021-10-14 MED ORDER — OXYCODONE HCL 5 MG PO TABS
5.0000 mg | ORAL_TABLET | Freq: Four times a day (QID) | ORAL | Status: DC | PRN
  Administered 2021-10-14 – 2021-10-15 (×3): 5 mg via ORAL
  Filled 2021-10-14 (×3): qty 1

## 2021-10-14 MED ORDER — IPRATROPIUM-ALBUTEROL 0.5-2.5 (3) MG/3ML IN SOLN
3.0000 mL | Freq: Three times a day (TID) | RESPIRATORY_TRACT | Status: DC
  Administered 2021-10-15: 3 mL via RESPIRATORY_TRACT
  Filled 2021-10-14: qty 3

## 2021-10-14 MED ORDER — ALBUTEROL SULFATE (2.5 MG/3ML) 0.083% IN NEBU
2.5000 mg | INHALATION_SOLUTION | RESPIRATORY_TRACT | Status: DC | PRN
Start: 2021-10-14 — End: 2021-10-15

## 2021-10-14 MED ORDER — FUROSEMIDE 40 MG PO TABS
40.0000 mg | ORAL_TABLET | Freq: Every day | ORAL | Status: DC
  Administered 2021-10-15: 40 mg via ORAL
  Filled 2021-10-14: qty 1

## 2021-10-14 MED ORDER — PANTOPRAZOLE SODIUM 40 MG PO TBEC
40.0000 mg | DELAYED_RELEASE_TABLET | Freq: Every day | ORAL | Status: DC
  Administered 2021-10-14 – 2021-10-15 (×2): 40 mg via ORAL
  Filled 2021-10-14 (×2): qty 1

## 2021-10-14 NOTE — Progress Notes (Signed)
Physical Therapy Treatment Patient Details Name: [name] MRN: [id number] DOB: 08/27/1945 Today's Date: 10/14/2021   History of Present Illness Pt is a 76 yo F who presented with altered mental status and difficulty breathing. EMS summoned and found her on the scene with sats in the 50s. She was placed on NIV in the ED and found to have a pH 7.1 and pCO2 >> 100. Despite the NIV she remained in that region for several hours, but her wheezing did improve with steroids and nebs. MD assessment includes: Acute alteration in mental status, Bilateral mild to moderate pleural effusions, Bibasilar atelectasis, and Acute Hypercapnic Resp Failure.    PT Comments    Pt was pleasant and motivated to participate during the session and put forth good effort throughout. Pt reported some nausea with nursing aware and pt provided medication.  Pt was able to amb improved distances this session with no adverse symptoms and with SpO2 and HR WNL.  Pt was steady with gait and transfers with good control.  Pt will benefit from HHPT upon discharge to safely address deficits listed in patient problem list for decreased caregiver assistance and eventual return to PLOF.     Recommendations for follow up therapy are one component of a multi-disciplinary discharge planning process, led by the attending physician.  Recommendations may be updated based on patient status, additional functional criteria and insurance authorization.  Follow Up Recommendations  Home health PT     Assistance Recommended at Discharge Frequent or constant Supervision/Assistance  Patient can return home with the following A little help with walking and/or transfers;A little help with bathing/dressing/bathroom;Assistance with cooking/housework;Assist for transportation   Equipment Recommendations  Rolling walker (2 wheels)    Recommendations for Other Services       Precautions / Restrictions Precautions Precautions:  Fall Restrictions Weight Bearing Restrictions: No     Mobility  Bed Mobility Overal bed mobility: Modified Independent             General bed mobility comments: Extra time and effort only    Transfers Overall transfer level: Needs assistance Equipment used: Rolling walker (2 wheels) Transfers: Sit to/from Stand Sit to Stand: Supervision           General transfer comment: Good eccentric and concentric control and stability    Ambulation/Gait Ambulation/Gait assistance: Supervision Gait Distance (Feet): 30 Feet x 2 Assistive device: Rolling walker (2 wheels) Gait Pattern/deviations: Step-through pattern, Decreased step length - right, Decreased step length - left Gait velocity: decreased     General Gait Details: Slow cadence but steady without LOB with SpO2 and HR WNL on 3LO2/min   Stairs             Wheelchair Mobility    Modified Rankin (Stroke Patients Only)       Balance Overall balance assessment: Needs assistance Sitting-balance support: No upper extremity supported, Feet supported Sitting balance-Leahy Scale: Good     Standing balance support: Bilateral upper extremity supported, During functional activity Standing balance-Leahy Scale: Good                              Cognition Arousal/Alertness: Awake/alert Behavior During Therapy: WFL for tasks assessed/performed Overall Cognitive Status: Within Functional Limits for tasks assessed  Exercises Total Joint Exercises Long Arc Quad: Strengthening, Both, 10 reps, 15 reps Knee Flexion: Strengthening, Both, 10 reps, 15 reps Other Exercises Other Exercises: PLB education and review    General Comments        Pertinent Vitals/Pain Pain Assessment Pain Assessment: 0-10 Pain Score: 4  Pain Location: gen body pain Pain Descriptors / Indicators: Sore Pain Intervention(s): Premedicated before session, Monitored  during session, Repositioned    Home Living                          Prior Function            PT Goals (current goals can now be found in the care plan section) Progress towards PT goals: Progressing toward goals    Frequency    Min 2X/week      PT Plan Current plan remains appropriate    Co-evaluation              AM-PAC PT "6 Clicks" Mobility   Outcome Measure  Help needed turning from your back to your side while in a flat bed without using bedrails?: A Little Help needed moving from lying on your back to sitting on the side of a flat bed without using bedrails?: A Little Help needed moving to and from a bed to a chair (including a wheelchair)?: A Little Help needed standing up from a chair using your arms (e.g., wheelchair or bedside chair)?: A Little Help needed to walk in hospital room?: A Little Help needed climbing 3-5 steps with a railing? : A Little 6 Click Score: 18    End of Session Equipment Utilized During Treatment: Gait belt;Oxygen Activity Tolerance: Patient tolerated treatment well Patient left: in chair;with call bell/phone within reach;with chair alarm set Nurse Communication: Mobility status PT Visit Diagnosis: Difficulty in walking, not elsewhere classified (R26.2);Muscle weakness (generalized) (M62.81)     Time: [id number] PT Time Calculation (min) (ACUTE ONLY): 33 min  Charges:  $Gait Training: 8-22 mins $Therapeutic Activity: 8-22 mins                     D. Scott Khizar Fiorella PT, DPT 10/14/21, 3:29 PM

## 2021-10-14 NOTE — TOC Progression Note (Signed)
Transition of Care [facility] - Progression Note    Patient Details  Name: DEZIRE TURK MRN: [id number] Date of Birth: 16-Nov-1945  Transition of Care [facility]) CM/SW Contact  Marlowe Sax, RN Phone Number: 10/14/2021, 2:36 PM  Clinical Narrative:    Had conversation with the patient, she would prefer to go home with home health, [facility] accepted her for [facility] and she would like to keep them in place for hh I spoke with her daughter Marcelino Duster, everyone agrees for her to go home She will need a rolling walker, [facility] will bring to the bedside for her to take home at DC  Her daughter stated that she has a job that she cant get away from and will have to make other arrangements for transport home, I explained it is likely to be tomorrow to DC, she stated she will call the patient and get it worked out  Expected Discharge Plan: Skilled Nursing Facility Barriers to Discharge: English as a second language teacher, Continued Medical Work up, SNF Pending bed offer  Expected Discharge Plan and Services Expected Discharge Plan: Skilled Nursing Facility   Discharge Planning Services: CM Consult   Living arrangements for the past 2 months: Apartment                                       Social Determinants of Health (SDOH) Interventions    Readmission Risk Interventions     View : No data to display.

## 2021-10-14 NOTE — Assessment & Plan Note (Signed)
Cont home meds 

## 2021-10-14 NOTE — Plan of Care (Signed)
  Problem: Education: Goal: Knowledge of General Education information will improve Description: Including pain rating scale, medication(s)/side effects and non-pharmacologic comfort measures 10/14/2021 0303 by Sarajane Marek, LPN Outcome: Progressing   Problem: Health Behavior/Discharge Planning: Goal: Ability to manage health-related needs will improve 10/14/2021 0326 by Sarajane Marek, LPN Outcome: Progressing   Problem: Clinical Measurements: Goal: Ability to maintain clinical measurements within normal limits will improve 10/14/2021 0303 by Sarajane Marek, LPN Outcome: Progressing   Problem: Clinical Measurements: Goal: Diagnostic test results will improve 10/14/2021 0326 by Sarajane Marek, LPN Outcome: Progressing   Problem: Clinical Measurements: Goal: Respiratory complications will improve 10/14/2021 0303 by Sarajane Marek, LPN Outcome: Progressing   Problem: Clinical Measurements: Goal: Cardiovascular complication will be avoided 10/14/2021 0303 by Sarajane Marek, LPN Outcome: Progressing   Problem: Education: Goal: Knowledge of disease or condition will improve 10/14/2021 0326 by Sarajane Marek, LPN Outcome: Progressing   Problem: Activity: Goal: Will verbalize the importance of balancing activity with adequate rest periods 10/14/2021 0303 by Sarajane Marek, LPN Outcome: Progressing   Problem: Respiratory: Goal: Ability to maintain a clear airway will improve 10/14/2021 0326 by Sarajane Marek, LPN Outcome: Progressing   Problem: Respiratory: Goal: Ability to maintain adequate ventilation will improve 10/14/2021 0303 by Sarajane Marek, LPN Outcome: Progressing

## 2021-10-14 NOTE — Plan of Care (Signed)
  Problem: Education: Goal: Knowledge of General Education information will improve Description: Including pain rating scale, medication(s)/side effects and non-pharmacologic comfort measures Outcome: Progressing   Problem: Health Behavior/Discharge Planning: Goal: Ability to manage health-related needs will improve Outcome: Progressing   Problem: Clinical Measurements: Goal: Ability to maintain clinical measurements within normal limits will improve Outcome: Progressing Goal: Will remain free from infection Outcome: Progressing Goal: Diagnostic test results will improve Outcome: Progressing Goal: Respiratory complications will improve Outcome: Progressing Goal: Cardiovascular complication will be avoided Outcome: Progressing   Problem: Safety: Goal: Ability to remain free from injury will improve Outcome: Progressing   Problem: Education: Goal: Knowledge of disease or condition will improve Outcome: Progressing Goal: Knowledge of the prescribed therapeutic regimen will improve Outcome: Progressing Goal: Individualized Educational Video(s) Outcome: Progressing   Problem: Activity: Goal: Ability to tolerate increased activity will improve Outcome: Progressing Goal: Will verbalize the importance of balancing activity with adequate rest periods Outcome: Progressing   Problem: Respiratory: Goal: Ability to maintain a clear airway will improve Outcome: Progressing Goal: Levels of oxygenation will improve Outcome: Progressing Goal: Ability to maintain adequate ventilation will improve Outcome: Progressing

## 2021-10-14 NOTE — TOC Progression Note (Signed)
Transition of Care [facility]) - Progression Note    Patient Details  Name: [name] MRN: [id number] Date of Birth: 07/29/1945  Transition of Care [facility]) CM/SW [place], RN Phone Number: 10/14/2021, 11:08 AM  Clinical Narrative:     Met with the patient to discuss DC plan and needs I notified her that [facility] would accept her for [facility] services, she has a Product/process development scientist as well as a BSC at home, She stated that she wants to go to [facility] SNF, PT will re-evaluate her for SNF, bedsearch was sent, PASSR obtained, FL2 completed, will review bed offers once obtained  Expected Discharge Plan: Skilled Nursing Facility Barriers to Discharge: Ship broker, Continued Medical Work up, SNF Pending bed offer  Expected Discharge Plan and Services Expected Discharge Plan: [place]   Discharge Planning Services: CM Consult   Living arrangements for the past 2 months: Apartment                                       Social Determinants of Health (SDOH) Interventions    Readmission Risk Interventions     View : No data to display.

## 2021-10-14 NOTE — Assessment & Plan Note (Signed)
   SSI while inpatient  Resume home meds on discharge

## 2021-10-14 NOTE — Assessment & Plan Note (Signed)
   Started PPI  Can continue outpatient, follow w/ PCP

## 2021-10-14 NOTE — Assessment & Plan Note (Signed)
No exacerbation  Repeat Echo pending  IV --> po lasix

## 2021-10-14 NOTE — NC FL2 (Signed)
[place] LEVEL OF CARE SCREENING TOOL     IDENTIFICATION  Patient Name: [name] Birthdate: 1946-02-15 Sex: female Admission Date (Current Location): 10/10/2021  [facility] and [place] Number:  Engineering geologist and Address:  [facility], [address] [place], [place] [address]      Provider Number: [id number]  Attending Physician Name and Address:  Emeterio Reeve, DO  Relative Name and Phone Number:  Sharyn Lull Daughter [phone]    Current Level of Care: Hospital Recommended Level of Care: [place] Prior Approval Number:    Date Approved/Denied:   PASRR Number: [id number]  Discharge Plan: SNF    Current Diagnoses: Patient Active Problem List   Diagnosis Date Noted   Acute respiratory failure with hypoxia and hypercarbia (HCC) 10/10/2021   Acute exacerbation of CHF (congestive heart failure) ([place]) 12/14/2020   Closed displaced intra-articular fracture of olecranon process of left ulna 11/19/2020   Closed nondisplaced transverse fracture of right patella 11/19/2020   Patellar fracture 11/18/2020   Olecranon fracture 11/18/2020   Major depressive disorder, recurrent, in remission, unspecified ([place]) 04/23/2020   Pulmonary HTN ([place])    Hypothyroidism, acquired [id number]   Acute metabolic encephalopathy [id number]   Confusion 03/17/2019   AMS (altered mental status) 03/12/2019   Acute encephalopathy 03/11/2019   Hypothyroidism 03/11/2019   Altered mental status, unspecified 03/11/2019   Medicare annual wellness visit, subsequent 06/13/2018   PSVT (paroxysmal supraventricular tachycardia) ([place]) 10/05/2017   Age-related osteoporosis without current pathological fracture 09/15/2017   Chronic obstructive pulmonary disease ([place]) 05/21/2017   Shortness of breath 04/05/2017   Bilateral carotid artery stenosis 03/17/2017   Hyperlipidemia, mixed 03/17/2017   Orthostatic hypotension 01/12/2017   Bronchitis  01/04/2017   Angina pectoris ([place]) 06/13/2015   UTI (lower urinary tract infection) 06/13/2015   Degenerative disc disease, lumbar 10/18/2014   Sacroiliac joint dysfunction 10/18/2014   Facet syndrome, lumbar 10/18/2014   Cervical facet joint syndrome 10/18/2014   Bilateral occipital neuralgia 10/18/2014   Migraine 10/18/2014   Neurosis, posttraumatic 10/08/2014   Posttraumatic stress disorder 10/08/2014   Spinal stenosis    PTSD (post-traumatic stress disorder)     Orientation RESPIRATION BLADDER Height & Weight     Self, Time, Situation, Place  Normal, O2 (4 liters) Continent Weight: 53.5 kg Height:  5\' 6"  (167.6 cm)  BEHAVIORAL SYMPTOMS/MOOD NEUROLOGICAL BOWEL NUTRITION STATUS      Continent Diet (see DC summary)  AMBULATORY STATUS COMMUNICATION OF NEEDS Skin   Extensive Assist Verbally Normal                       Personal Care Assistance Level of Assistance  Bathing, Feeding, Dressing Bathing Assistance: Limited assistance Feeding assistance: Independent Dressing Assistance: Limited assistance     Functional Limitations Info             SPECIAL CARE FACTORS FREQUENCY  PT (By licensed PT), OT (By licensed OT)     PT Frequency: 5 times per week OT Frequency: 5 times per week            Contractures Contractures Info: Not present    Additional Factors Info  Code Status, Allergies Code Status Info: Full code Allergies Info: Celecoxib, Pregabalin           Current Medications (10/14/2021):  This is the current hospital active medication list Current Facility-Administered Medications  Medication Dose Route Frequency Provider Last Rate Last Admin   acetaminophen (TYLENOL) tablet 650  mg  650 mg Oral Q6H PRN Rust-Chester, Huel Cote, NP   650 mg at 10/14/21 0548   docusate sodium (COLACE) capsule 100 mg  100 mg Oral BID PRN Bradly Bienenstock, NP       doxycycline (VIBRA-TABS) tablet 100 mg  100 mg Oral Q12H Ottie Glazier, MD   100 mg at 10/14/21 0954    enoxaparin (LOVENOX) injection 40 mg  40 mg Subcutaneous Q24H Darel Hong D, NP   40 mg at 10/13/21 2325   FLUoxetine (PROZAC) capsule 40 mg  40 mg Oral Daily Rust-Chester, Britton L, NP   40 mg at 10/14/21 0954   furosemide (LASIX) injection 20 mg  20 mg Intravenous Daily Ottie Glazier, MD   20 mg at 10/14/21 0950   insulin aspart (novoLOG) injection 0-15 Units  0-15 Units Subcutaneous TID WC Aleskerov, Luvenia Heller, MD       ipratropium-albuterol (DUONEB) 0.5-2.5 (3) MG/3ML nebulizer solution 3 mL  3 mL Nebulization Q6H Darel Hong D, NP   3 mL at 10/14/21 0748   levothyroxine (SYNTHROID) tablet 88 mcg  88 mcg Oral QAC breakfast Darel Hong D, NP   88 mcg at 10/14/21 0548   LORazepam (ATIVAN) injection 1 mg  1 mg Intravenous BID PRN Rust-Chester, Toribio Harbour L, NP   1 mg at 10/11/21 1317   mirtazapine (REMERON) tablet 7.5 mg  7.5 mg Oral QHS Rust-Chester, Britton L, NP   7.5 mg at 10/13/21 2316   ondansetron (ZOFRAN) injection 4 mg  4 mg Intravenous Q6H PRN Darel Hong D, NP   4 mg at 10/14/21 0950   oxyCODONE (Oxy IR/ROXICODONE) immediate release tablet 5 mg  5 mg Oral Q6H PRN Emeterio Reeve, DO   5 mg at 10/14/21 1003   phosphorus (K PHOS NEUTRAL) tablet 500 mg  500 mg Oral Q4H Chinita Greenland A, RPH   500 mg at 10/14/21 0809   polyethylene glycol (MIRALAX / GLYCOLAX) packet 17 g  17 g Oral Daily PRN Bradly Bienenstock, NP       predniSONE (DELTASONE) tablet 50 mg  50 mg Oral Q breakfast Ottie Glazier, MD   50 mg at 10/14/21 0809   spironolactone (ALDACTONE) tablet 25 mg  25 mg Oral Daily Ottie Glazier, MD   25 mg at 10/14/21 [id number]     Discharge Medications: Please see discharge summary for a list of discharge medications.  Relevant Imaging Results:  Relevant Lab Results:   Additional Information SSN: [id number]  Conception Oms, RN

## 2021-10-14 NOTE — Progress Notes (Signed)
PROGRESS NOTE    [name]  AYO:[id number] DOB: 05-06-1946  DOA: 10/10/2021 Date of Service: 10/14/21 PCP: Sallyanne Kuster, NP     Brief Narrative / Hospital Course:  Ms. Divirgilio is a 76 year old female, PMH COPD, CHF, DM 2, anxiety/depression, fibromyalgia, thyroid disorder.   Presented to ED 10/10/2021 via EMS, found at her apartment on the floor by home health aide.  Per EMS, initial pulse ox 50% on room air, treated with DuoNebs, Solu-Medrol, magnesium, still hypoxic/somnolent to report to ED provider she had been having several days of SOB, productive cough, fever.  Placed on noninvasive ventilation, started on antibiotics with ceftriaxone and azithromycin.  Admitted to ICU/critical care service for COPD exacerbation, started on BiPAP, maintain S ABA, steroids, CTX/AZM.  ABG corrected to chronic compensation levels.   10/13/2021 was able to be downgraded from stepdown to med/surg.  PT/OT recommend home health. 10/14/2021: Saturating in mid 90s to 100% on 4 L/min O2 [place].  Patient requesting SNF placement, PT/OT reevaluating.  Consultants:  PCCU  Procedures: none    Subjective: Patient reports overall feeling a bit better, but still very weak and reporting short of breath.  Also complaining of acid reflux and generalized arthritis pain, requesting medications for this.     ASSESSMENT & PLAN:   Principal Problem:   Acute respiratory failure with hypoxia and hypercarbia (HCC) Active Problems:   Hypothyroidism   Controlled type 2 diabetes mellitus without complication, without long-term current use of insulin (HCC)   Chronic heart failure with preserved ejection fraction (HFpEF) (HCC)   Mental health disorder   GERD (gastroesophageal reflux disease)   Acute respiratory failure with hypoxia and hypercarbia (HCC) Initially admitted to ICU/stepdown, able to be weaned off BiPAP to room air, still requiring 4 L nasal cannula Antibiotics changed from ceftriaxone/azithromycin  to oral doxycycline. (today is Day 5 abx) Continuing DuoNeb every 6 h and as needed Prednisone 50 mg p.o. daily down from IV SoluMedrol, can d/c tomorrow (total 5 days steroids) Goal: wean to room air vs evaluate for home O2 need   Chronic heart failure with preserved ejection fraction (HFpEF) (HCC) No exacerbation Repeat Echo pending IV --> po lasix   Mental health disorder Continue home meds: fluoxetine, mirtazapine   Hypothyroidism Cont home meds   Controlled type 2 diabetes mellitus without complication, without long-term current use of insulin (HCC) SSI while inpatient Resume home meds on discharge   GERD (gastroesophageal reflux disease) Started PPI Can continue outpatient, follow w/ PCP      DVT prophylaxis: lovenox Code Status: FULL Family Communication: none at this time, decliens call to support person Disposition Plan / TOC needs: pt requesting SNF, PT/OT to revaluate Barriers to discharge / significant pending items: still on higher O2 requirement than baseline, pending SNF/HH services              Objective: Vitals:   10/14/21 0428 10/14/21 0500 10/14/21 0812 10/14/21 0819  BP: (!) 161/71  (!) 153/77   Pulse: 60  88   Resp: 18     Temp: 98.8 F (37.1 C)  98.4 F (36.9 C)   TempSrc:      SpO2: 98%  95% 96%  Weight:  53.5 kg    Height:        Intake/Output Summary (Last 24 hours) at 10/14/2021 1327 Last data filed at 10/14/2021 1145 Gross per 24 hour  Intake --  Output 2100 ml  Net -2100 ml   Filed Weights   10/12/21 0500  10/13/21 0400 10/14/21 0500  Weight: 51.6 kg 54 kg 53.5 kg    Examination:  Constitutional:  VS as above General Appearance: alert, well-developed, thin, NAD Ears, Nose, Mouth, Throat: Normal appearance Neck: No masses, trachea midline Respiratory: Normal respiratory effort Breath sounds diminished in all fields, no wheeze/rhonchi/rales Cardiovascular: S1/S2 normal, no murmur/rub/gallop auscultated No lower  extremity edema Gastrointestinal: Nontender, no masses Musculoskeletal:  No clubbing/cyanosis of digits Neurological: No cranial nerve deficit on limited exam Psychiatric: Normal judgment/insight Normal mood and affect       Scheduled Medications:   doxycycline  100 mg Oral Q12H   enoxaparin (LOVENOX) injection  40 mg Subcutaneous Q24H   FLUoxetine  40 mg Oral Daily   [START ON 10/15/2021] furosemide  40 mg Oral Daily   insulin aspart  0-15 Units Subcutaneous TID WC   ipratropium-albuterol  3 mL Nebulization Q6H   levothyroxine  88 mcg Oral QAC breakfast   mirtazapine  7.5 mg Oral QHS   pantoprazole  40 mg Oral Daily   predniSONE  50 mg Oral Q breakfast   spironolactone  25 mg Oral Daily    Continuous Infusions:   PRN Medications:  acetaminophen, calcium carbonate, docusate sodium, LORazepam, ondansetron (ZOFRAN) IV, oxyCODONE, polyethylene glycol  Antimicrobials:  Anti-infectives (From admission, onward)    Start     Dose/Rate Route Frequency Ordered Stop   10/13/21 1000  doxycycline (VIBRA-TABS) tablet 100 mg        100 mg Oral Every 12 hours 10/13/21 0851 10/17/21 0959   10/11/21 1000  azithromycin (ZITHROMAX) 500 mg in sodium chloride 0.9 % 250 mL IVPB  Status:  Discontinued        500 mg 250 mL/hr over 60 Minutes Intravenous Every 24 hours 10/10/21 1718 10/13/21 0842   10/11/21 1000  cefTRIAXone (ROCEPHIN) 1 g in sodium chloride 0.9 % 100 mL IVPB  Status:  Discontinued        1 g 200 mL/hr over 30 Minutes Intravenous Every 24 hours 10/10/21 1718 10/13/21 0842   10/10/21 1130  cefTRIAXone (ROCEPHIN) 1 g in sodium chloride 0.9 % 100 mL IVPB        1 g 200 mL/hr over 30 Minutes Intravenous  Once 10/10/21 1121 10/10/21 1142   10/10/21 1130  azithromycin (ZITHROMAX) 500 mg in sodium chloride 0.9 % 250 mL IVPB        500 mg 250 mL/hr over 60 Minutes Intravenous  Once 10/10/21 1121 10/10/21 1245       Data Reviewed: I have personally reviewed following labs and  imaging studies  CBC: Recent Labs  Lab 10/10/21 1019 10/11/21 0515 10/12/21 0411 10/13/21 0511 10/14/21 0414  WBC 13.2* 11.6* 9.5 9.2 10.7*  NEUTROABS 10.5*  --   --   --   --   HGB 12.8 11.9* 12.0 11.6* 12.8  HCT 43.3 39.7 38.4 36.0 40.3  MCV 105.4* 103.4* 100.8* 99.7 98.1  PLT 211 191 233 201 219   Basic Metabolic Panel: Recent Labs  Lab 10/10/21 1019 10/11/21 0515 10/11/21 2009 10/12/21 0411 10/13/21 0511 10/14/21 0414  NA 138 139  --  142 137 138  K 3.9 3.5  --  3.9 4.0 4.5  CL 89* 90*  --  95* 92* 93*  CO2 34* 38*  --  37* 35* 37*  GLUCOSE 260* 131*  --  167* 142* 124*  BUN 32* 31*  --  28* 21 15  CREATININE 0.74 0.67  --  0.59 0.50 0.57  CALCIUM  9.9 9.6  --  8.8* 8.5* 9.0  MG  --  2.5*  --  2.6* 2.4  --   PHOS  --  1.4* 3.4 2.3* 2.1* 2.4*   GFR: Estimated Creatinine Clearance: 50.5 mL/min (by C-G formula based on SCr of 0.57 mg/dL). Liver Function Tests: Recent Labs  Lab 10/10/21 1019  AST 51*  ALT 25  ALKPHOS 72  BILITOT 0.7  PROT 7.5  ALBUMIN 3.7   No results for input(s): LIPASE, AMYLASE in the last 168 hours. No results for input(s): AMMONIA in the last 168 hours. Coagulation Profile: No results for input(s): INR, PROTIME in the last 168 hours. Cardiac Enzymes: No results for input(s): CKTOTAL, CKMB, CKMBINDEX, TROPONINI in the last 168 hours. BNP (last 3 results) No results for input(s): PROBNP in the last 8760 hours. HbA1C: No results for input(s): HGBA1C in the last 72 hours. CBG: Recent Labs  Lab 10/13/21 1600 10/13/21 2137 10/13/21 2324 10/14/21 0759 10/14/21 1153  GLUCAP 165* 143* 132* 115* 157*   Lipid Profile: No results for input(s): CHOL, HDL, LDLCALC, TRIG, CHOLHDL, LDLDIRECT in the last 72 hours. Thyroid Function Tests: No results for input(s): TSH, T4TOTAL, FREET4, T3FREE, THYROIDAB in the last 72 hours. Anemia Panel: No results for input(s): VITAMINB12, FOLATE, FERRITIN, TIBC, IRON, RETICCTPCT in the last 72  hours. Urine analysis:    Component Value Date/Time   COLORURINE YELLOW (A) 10/10/2021 1740   APPEARANCEUR HAZY (A) 10/10/2021 1740   APPEARANCEUR Clear 03/12/2020 1040   LABSPEC 1.017 10/10/2021 1740   LABSPEC 1.009 12/27/2013 1119   PHURINE 5.0 10/10/2021 1740   GLUCOSEU 50 (A) 10/10/2021 1740   GLUCOSEU Negative 12/27/2013 1119   HGBUR SMALL (A) 10/10/2021 1740   BILIRUBINUR NEGATIVE 10/10/2021 1740   BILIRUBINUR Negative 03/12/2020 1040   BILIRUBINUR Negative 12/27/2013 1119   KETONESUR NEGATIVE 10/10/2021 1740   PROTEINUR 100 (A) 10/10/2021 1740   NITRITE NEGATIVE 10/10/2021 1740   LEUKOCYTESUR SMALL (A) 10/10/2021 1740   LEUKOCYTESUR Trace 12/27/2013 1119   Sepsis Labs: (procalcitonin:4,lacticidven:4)  Recent Results (from the past 240 hour(s))  SARS Coronavirus 2 by RT PCR (hospital order, performed in [facility] hospital lab) *cepheid single result test* Anterior Nasal Swab     Status: None   Collection Time: 10/10/21 10:20 AM   Specimen: Anterior Nasal Swab  Result Value Ref Range Status   SARS Coronavirus 2 by RT PCR NEGATIVE NEGATIVE Final    Comment: (NOTE) SARS-CoV-2 target nucleic acids are NOT DETECTED.  The SARS-CoV-2 RNA is generally detectable in upper and lower respiratory specimens during the acute phase of infection. The lowest concentration of SARS-CoV-2 viral copies this assay can detect is 250 copies / mL. A negative result does not preclude SARS-CoV-2 infection and should not be used as the sole basis for treatment or other patient management decisions.  A negative result may occur with improper specimen collection / handling, submission of specimen other than nasopharyngeal swab, presence of viral mutation(s) within the areas targeted by this assay, and inadequate number of viral copies (<250 copies / mL). A negative result must be combined with clinical observations, patient history, and epidemiological information.  Fact Sheet for  Patients:   RoadLapTop.co.za  Fact Sheet for Healthcare Providers: http://kim-miller.com/  This test is not yet approved or  cleared by the [place] FDA and has been authorized for detection and/or diagnosis of SARS-CoV-2 by FDA under an Emergency Use Authorization (EUA).  This EUA will remain in effect (meaning this test can  be used) for the duration of the COVID-19 declaration under Section 564(b)(1) of the Act, 21 U.S.C. section 360bbb-3(b)(1), unless the authorization is terminated or revoked sooner.  Performed at [facility], [address] [place], [place] [address]   MRSA Next Gen by PCR, Nasal     Status: None   Collection Time: 10/11/21  9:09 PM   Specimen: Nasal Mucosa; Nasal Swab  Result Value Ref Range Status   MRSA by PCR Next Gen NOT DETECTED NOT DETECTED Final    Comment: (NOTE) The GeneXpert MRSA Assay (FDA approved for NASAL specimens only), is one component of a comprehensive MRSA colonization surveillance program. It is not intended to diagnose MRSA infection nor to guide or monitor treatment for MRSA infections. Test performance is not FDA approved in patients less than 77 years old. Performed at [facility], [address] [place], [place] [address]          Radiology Studies last 96 hours: [facility]  Result Date: 10/13/2021 CLINICAL DATA:  COPD.  Congestive heart failure. EXAM: PORTABLE CHEST 1 VIEW COMPARISON:  10/11/2021 FINDINGS: The heart size and mediastinal contours are within normal limits. Aortic atherosclerotic calcification incidentally noted. Pulmonary hyperinflation and interstitial thickening again seen, consistent with COPD. No evidence of acute infiltrate pleural effusion. IMPRESSION: COPD. No acute findings. Electronically Signed   By: Danae OrleansJohn A Stahl M.D.   On: 10/13/2021 10:49   DG Chest Port 1 View  Result Date: 10/11/2021 CLINICAL DATA:  Acute on  chronic respiratory failure EXAM: PORTABLE CHEST 1 VIEW COMPARISON:  Yesterday FINDINGS: Coarse interstitial opacities which are diffuse. Large lung volumes with diaphragm flattening. Normal heart size. Large appearance of the main pulmonary artery. IMPRESSION: Stable from yesterday.  Emphysema with possible superimposed edema. Electronically Signed   By: Tiburcio PeaJonathan  Watts M.D.   On: 10/11/2021 05:16            LOS: 4 days    Time spent: 35 min    Sunnie NielsenNatalie Rethel Sebek, DO [facility] 10/14/2021, 1:27 PM   Staff may message me via secure chat in Epic  but this may not receive immediate response,  please page for urgent matters!  If 7PM-7AM, please contact night-coverage [email or web address]  Dictation software was used to generate the above note. Typos may occur and escape review, as with typed/written notes. Please contact Dr Lyn HollingsheadAlexander directly for clarity if needed.

## 2021-10-14 NOTE — Assessment & Plan Note (Signed)
   Continue home meds: fluoxetine, mirtazapine

## 2021-10-14 NOTE — Care Management Important Message (Signed)
Important Message  Patient Details  Name: [name] MRN: [id number] Date of Birth: 17-Aug-1945   Medicare Important Message Given:  Yes     Olegario Messier A Milia Warth 10/14/2021, 3:12 PM

## 2021-10-14 NOTE — Consult Note (Signed)
PHARMACY CONSULT NOTE  Pharmacy Consult for Electrolyte Monitoring and Replacement   Recent Labs: Potassium (mmol/L)  Date Value  10/14/2021 4.5  12/27/2013 3.5   Magnesium (mg/dL)  Date Value  18/29/9371 2.4   Calcium (mg/dL)  Date Value  69/67/8938 9.0   Calcium, Total (mg/dL)  Date Value  03/03/5101 8.8   Albumin (g/dL)  Date Value  58/52/7782 3.7  12/27/2013 3.4   Phosphorus (mg/dL)  Date Value  42/35/3614 2.4 (L)   Sodium (mmol/L)  Date Value  10/14/2021 138  03/26/2020 137  12/27/2013 138     Assessment: Pharmacy has been consulted to monitor and replace electrolytes in 75yo patient admitted with shortness of breath, arriving via EMS with initial pulse ox of 50%. CXR showing possible pneumonia vs CHF. -hx CHF No IVF currently running.  Goal of Therapy:  Electrolytes WNL  Plan:  - Phos 2.4  K 4.5   Na 138   Scr 0.57  -will order KPHOS neutral 2 tabs PO q4h x 2 doses  -watch CHF/fluid/Na - will recheck electrolytes with AM labs  Angelique Blonder ,PharmD Clinical Pharmacist 10/14/2021 7:39 AM

## 2021-10-14 NOTE — Assessment & Plan Note (Addendum)
Initially admitted to ICU/stepdown, able to be weaned off BiPAP to room air, still requiring 4 L nasal cannula  Antibiotics changed from ceftriaxone/azithromycin to oral doxycycline. (today is Day 5 abx)  Continuing DuoNeb every 6 h and as needed  Prednisone 50 mg p.o. daily down from IV SoluMedrol, can d/c tomorrow (total 5 days steroids)  Goal: wean to room air vs evaluate for home O2 need

## 2021-10-15 LAB — BASIC METABOLIC PANEL
Anion gap: 6 (ref 5–15)
BUN: 14 mg/dL (ref 8–23)
CO2: 37 mmol/L — ABNORMAL HIGH (ref 22–32)
Calcium: 8.4 mg/dL — ABNORMAL LOW (ref 8.9–10.3)
Chloride: 93 mmol/L — ABNORMAL LOW (ref 98–111)
Creatinine, Ser: 0.64 mg/dL (ref 0.44–1.00)
GFR, Estimated: >60 mL/min (ref >60)
Glucose, Bld: 119 mg/dL — ABNORMAL HIGH (ref 70–99)
Potassium: 3.3 mmol/L — ABNORMAL LOW (ref 3.5–5.1)
Sodium: 136 mmol/L (ref 135–145)

## 2021-10-15 LAB — GLUCOSE, CAPILLARY
Glucose-Capillary: 257 mg/dL — ABNORMAL HIGH (ref 70–99)
Glucose-Capillary: 97 mg/dL (ref 70–99)
Glucose-Capillary: 198 mg/dL — ABNORMAL HIGH (ref 70–99)

## 2021-10-15 LAB — PHOSPHORUS: Phosphorus: 3.1 mg/dL (ref 2.5–4.6)

## 2021-10-15 MED ORDER — DOXYCYCLINE HYCLATE 100 MG PO TABS: 100.0000 mg | ORAL_TABLET | Freq: Two times a day (BID) | ORAL | 0 refills | Status: AC

## 2021-10-15 MED ORDER — FUROSEMIDE 40 MG PO TABS: 40.0000 mg | ORAL_TABLET | Freq: Every day | ORAL | 1 refills | Status: DC

## 2021-10-15 MED ORDER — FLUCONAZOLE 50 MG PO TABS
150.0000 mg | ORAL_TABLET | Freq: Every day | ORAL | Status: DC
  Administered 2021-10-15: 150 mg via ORAL
  Filled 2021-10-15: qty 1

## 2021-10-15 MED ORDER — IPRATROPIUM-ALBUTEROL 0.5-2.5 (3) MG/3ML IN SOLN: 3.0000 mL | Freq: Two times a day (BID) | RESPIRATORY_TRACT | Status: DC

## 2021-10-15 MED ORDER — POTASSIUM CHLORIDE CRYS ER 20 MEQ PO TBCR
40.0000 meq | EXTENDED_RELEASE_TABLET | ORAL | Status: AC
  Administered 2021-10-15 (×2): 40 meq via ORAL
  Filled 2021-10-15 (×2): qty 2

## 2021-10-15 MED ORDER — FLUCONAZOLE 150 MG PO TABS: 150.0000 mg | ORAL_TABLET | Freq: Every day | ORAL | 0 refills | Status: AC

## 2021-10-15 MED ORDER — SPIRONOLACTONE 25 MG PO TABS: 25.0000 mg | ORAL_TABLET | Freq: Every day | ORAL | 1 refills | Status: DC

## 2021-10-15 MED ORDER — PANTOPRAZOLE SODIUM 40 MG PO TBEC: 40.0000 mg | DELAYED_RELEASE_TABLET | Freq: Every day | ORAL | 0 refills | Status: DC

## 2021-10-15 NOTE — Discharge Summary (Signed)
Physician Discharge Summary   Patient: [name] MRN: [id number] DOB: 24-Aug-1945  Admit date:     10/10/2021  Discharge date: 10/15/21  Discharge Physician: Lorella Nimrod   PCP: Jonetta Osgood, NP   Recommendations at discharge:  Please obtain CBC and BMP in 1 week. Follow-up with primary care provider  Discharge Diagnoses: Principal Problem:   Acute respiratory failure with hypoxia and hypercarbia (HCC) Active Problems:   Hypothyroidism   Controlled type 2 diabetes mellitus without complication, without long-term current use of insulin (HCC)   Chronic heart failure with preserved ejection fraction (HFpEF) ([place])   Mental health disorder   GERD (gastroesophageal reflux disease)  Resolved Problems:   * No resolved hospital problems. [facility] Course: [name] is a 76 year old female, PMH COPD, CHF, DM 2, anxiety/depression, fibromyalgia, thyroid disorder.   Presented to ED 10/10/2021 via EMS, found at her apartment on the floor by home health aide.  Per EMS, initial pulse ox 50% on room air, treated with DuoNebs, Solu-Medrol, magnesium, still hypoxic/somnolent to report to ED provider she had been having several days of SOB, productive cough, fever.  Placed on noninvasive ventilation, started on antibiotics with ceftriaxone and azithromycin.  Admitted to ICU/critical care service for COPD exacerbation, started on BiPAP, maintain S ABA, steroids, CTX/AZM.  ABG corrected to chronic compensation levels.   10/13/2021 was able to be downgraded from stepdown to med/surg.  PT/OT recommend home health. 10/14/2021: Saturating in mid 90s to 100% on 4 L/min O2 [place]. 5/31: Patient continued to improve and now at her baseline oxygen requirement of 2 L.  She also developed vaginal itching and burning, stating that she to get yeast infections with antibiotics.  She was given a dose of Diflucan, we also give her 2 more pills to be used in 3 days if her symptoms persist.  She is being discharged  home with home health services.   Patient will continue with current medications and follow-up with her providers.  Assessment and Plan: * Acute respiratory failure with hypoxia and hypercarbia (HCC) Initially admitted to ICU/stepdown, able to be weaned off BiPAP to room air, still requiring 4 L nasal cannula Antibiotics changed from ceftriaxone/azithromycin to oral doxycycline. (today is Day 5 abx) Continuing DuoNeb every 6 h and as needed Prednisone 50 mg p.o. daily down from IV SoluMedrol, can d/c tomorrow (total 5 days steroids) Goal: wean to room air vs evaluate for home O2 need   Hypothyroidism Cont home meds   GERD (gastroesophageal reflux disease) Started PPI Can continue outpatient, follow w/ PCP   Mental health disorder Continue home meds: fluoxetine, mirtazapine   Chronic heart failure with preserved ejection fraction (HFpEF) ([place]) No exacerbation Repeat Echo pending IV --> po lasix   Controlled type 2 diabetes mellitus without complication, without long-term current use of insulin ([place]) SSI while inpatient Resume home meds on discharge    Consultants: PCCM Procedures performed: None Disposition: Home health Diet recommendation:  Discharge Diet Orders (From admission, onward)     Start     Ordered   10/14/21 0000  Diet - low sodium heart healthy        10/14/21 1510           Cardiac and Carb modified diet DISCHARGE MEDICATION: Allergies as of 10/15/2021       Reactions   Celecoxib    Other reaction(s): Other (qualifier value) Patient is allergic to Celebrex and found that it caused heart failure. CHF   Pregabalin  Other reaction(s): Localized superficial swelling of skin        Medication List     STOP taking these medications    aspirin EC 81 MG tablet   azithromycin 250 MG tablet Commonly known as: ZITHROMAX       TAKE these medications    acetaminophen 500 MG tablet Commonly known as: TYLENOL Take 1,000 mg by mouth in the  morning, at noon, and at bedtime.   albuterol 108 (90 Base) MCG/ACT inhaler Commonly known as: VENTOLIN HFA INAHLE 2 PUFFS INTO THE LUNGS EVERY 6 HOURS AS NEEDED FOR WHEEZING OR SHORTNESS OF BREATH   diphenhydrAMINE 25 MG tablet Commonly known as: BENADRYL Take 50 mg by mouth 2 (two) times daily.   doxycycline 100 MG tablet Commonly known as: VIBRA-TABS Take 1 tablet (100 mg total) by mouth every 12 (twelve) hours for 2 days.   Ergocalciferol 50 MCG (2000 UT) Tabs Take 2 capsules by mouth daily.   fluconazole 150 MG tablet Commonly known as: DIFLUCAN Take 1 tablet (150 mg total) by mouth daily for 2 doses. Start taking on: October 16, 2021   FLUoxetine 40 MG capsule Commonly known as: PROZAC Take 1 capsule (40 mg total) by mouth daily as needed.   fluticasone 50 MCG/ACT nasal spray Commonly known as: FLONASE Place 2 sprays into both nostrils in the morning and at bedtime.   furosemide 40 MG tablet Commonly known as: LASIX Take 1 tablet (40 mg total) by mouth daily. Start taking on: October 16, 2021   hydrocortisone 2.5 % cream Place 1 application rectally 2 (two) times daily as needed (itching/hemorroids).   levothyroxine 88 MCG tablet Commonly known as: SYNTHROID Take 1 tablet (88 mcg total) by mouth daily before breakfast. Take 30 minutes before food and with a full glass of water   LORazepam 1 MG tablet Commonly known as: ATIVAN Take 1 tablet (1 mg total) by mouth 2 (two) times daily as needed for anxiety.   lubiprostone 8 MCG capsule Commonly known as: AMITIZA Take 1 capsule (8 mcg total) by mouth daily with breakfast.   mirtazapine 7.5 MG tablet Commonly known as: REMERON Take 7.5 mg by mouth at bedtime. What changed: Another medication with the same name was removed. Continue taking this medication, and follow the directions you see here.   OXYGEN Inhale into the lungs. Pt is on 2 liters oxygen 24 hrs uses [facility]   pantoprazole 40 MG  tablet Commonly known as: PROTONIX Take 1 tablet (40 mg total) by mouth daily. Start taking on: October 16, 2021   predniSONE 10 MG tablet Commonly known as: DELTASONE Take one tab 3 x day for 3 days, then take one tab 2 x a day for 3 days and then take one tab a day for 3 days for copd   rosuvastatin 20 MG tablet Commonly known as: Crestor Take 1 tablet (20 mg total) by mouth daily.   Spiriva HandiHaler 18 MCG inhalation capsule Generic drug: tiotropium Place 1 capsule (18 mcg total) into inhaler and inhale daily.   spironolactone 25 MG tablet Commonly known as: ALDACTONE Take 1 tablet (25 mg total) by mouth daily. Start taking on: October 16, 2021   Symbicort 160-4.5 MCG/ACT inhaler Generic drug: budesonide-formoterol Inhale 2 puffs into the lungs 2 (two) times daily.   traMADol 50 MG tablet Commonly known as: ULTRAM Take 1 tablet (50 mg total) by mouth 2 (two) times daily as needed for moderate pain or severe pain.  Durable Medical Equipment  (From admission, onward)           Start     Ordered   10/14/21 1443  For home use only DME Walker rolling  Once       Question Answer Comment  Walker: With [place] Wheels   Patient needs a walker to treat with the following condition Difficulty walking      10/14/21 1442   10/13/21 1146  For home use only DME Walker rolling  Once       Question Answer Comment  Walker: With [employer] In[employer] Wheels   Patient needs a walker to treat with the following condition Difficulty walking      10/13/21 1145            Follow-up Information     Jonetta Osgood, NP. Schedule an appointment as soon as possible for a visit in 1 week(s).   Specialty: Nurse Practitioner Contact information: [place] [address] [phone]         Wellington Hampshire, MD .   Specialty: Cardiology Contact information: [address] [place] [address] [phone]                Discharge  Exam: Danley Danker Weights   10/13/21 0400 10/14/21 0500 10/15/21 0600  Weight: 54 kg 53.5 kg 53.5 kg   General.     In no acute distress. Pulmonary.  Lungs clear bilaterally, normal respiratory effort. CV.  Regular rate and rhythm, no JVD, rub or murmur. Abdomen.  Soft, nontender, nondistended, BS positive. CNS.  Alert and oriented .  No focal neurologic deficit. Extremities.  No edema, no cyanosis, pulses intact and symmetrical. Psychiatry.  Judgment and insight appears normal.   Condition at discharge: stable  The results of significant diagnostics from this hospitalization (including imaging, microbiology, ancillary and laboratory) are listed below for reference.   Imaging Studies: DG Chest Port 1 View  Result Date: 10/13/2021 CLINICAL DATA:  COPD.  Congestive heart failure. EXAM: PORTABLE CHEST 1 VIEW COMPARISON:  10/11/2021 FINDINGS: The heart size and mediastinal contours are within normal limits. Aortic atherosclerotic calcification incidentally noted. Pulmonary hyperinflation and interstitial thickening again seen, consistent with COPD. No evidence of acute infiltrate pleural effusion. IMPRESSION: COPD. No acute findings. Electronically Signed   By: Marlaine Hind M.D.   On: 10/13/2021 10:49   DG Chest Port 1 View  Result Date: 10/11/2021 CLINICAL DATA:  Acute on chronic respiratory failure EXAM: PORTABLE CHEST 1 VIEW COMPARISON:  Yesterday FINDINGS: Coarse interstitial opacities which are diffuse. Large lung volumes with diaphragm flattening. Normal heart size. Large appearance of the main pulmonary artery. IMPRESSION: Stable from yesterday.  Emphysema with possible superimposed edema. Electronically Signed   By: Jorje Guild M.D.   On: 10/11/2021 05:16   DG Chest Portable 1 View  Result Date: 10/10/2021 CLINICAL DATA:  Shortness of breath EXAM: PORTABLE CHEST 1 VIEW COMPARISON:  12/14/2020 FINDINGS: Mild cardiomegaly. Pulmonary vascular prominence and diffuse bilateral interstitial  opacity. The visualized skeletal structures are unremarkable. IMPRESSION: Mild cardiomegaly with pulmonary vascular prominence and diffuse bilateral interstitial opacity, likely edema. No focal airspace opacity. Electronically Signed   By: Delanna Ahmadi M.D.   On: 10/10/2021 10:44   ECHOCARDIOGRAM COMPLETE  Result Date: 10/14/2021    ECHOCARDIOGRAM REPORT   Patient Name:   [name] Date of Exam: 10/14/2021 Medical Rec #:  [id number]     Height:       66.0 in  Accession #:    [id number]    Weight:       117.9 lb Date of Birth:  Sep 11, 1945     BSA:          1.598 m Patient Age:    25 years      BP:           153/77 mmHg Patient Gender: F             HR:           84 bpm. Exam Location:  [facility] Procedure: 2D Echo, Cardiac Doppler and Color Doppler Indications:     I50.31 CHF acute diastolic  History:         Patient has prior history of Echocardiogram examinations. CHF,                  COPD and Pulmonary HTN; Risk Factors:Former Smoker,                  Dyslipidemia, Hypertension and Diabetes.  Sonographer:     Rosalia Hammers Referring Phys:  [id number] ALESKEROV Diagnosing Phys: Isaias Cowman MD  Sonographer Comments: Suboptimal parasternal window. Image acquisition challenging due to patient body habitus and Image acquisition challenging due to respiratory motion. IMPRESSIONS  1. Left ventricular ejection fraction, by estimation, is 50 to 55%. The left ventricle has low normal function. The left ventricle has no regional wall motion abnormalities. There is moderate left ventricular hypertrophy. Left ventricular diastolic parameters are consistent with Grade I diastolic dysfunction (impaired relaxation).  2. Right ventricular systolic function is normal. The right ventricular size is normal.  3. The mitral valve is normal in structure. Mild mitral valve regurgitation. No evidence of mitral stenosis.  4. The aortic valve is normal in structure. Aortic valve regurgitation is trivial. No aortic stenosis is  present.  5. The inferior vena cava is normal in size with greater than 50% respiratory variability, suggesting right atrial pressure of 3 mmHg. FINDINGS  Left Ventricle: Left ventricular ejection fraction, by estimation, is 50 to 55%. The left ventricle has low normal function. The left ventricle has no regional wall motion abnormalities. The left ventricular internal cavity size was normal in size. There is moderate left ventricular hypertrophy. Left ventricular diastolic parameters are consistent with Grade I diastolic dysfunction (impaired relaxation). Right Ventricle: The right ventricular size is normal. No increase in right ventricular wall thickness. Right ventricular systolic function is normal. Left Atrium: Left atrial size was normal in size. Right Atrium: Right atrial size was normal in size. Pericardium: There is no evidence of pericardial effusion. Mitral Valve: The mitral valve is normal in structure. Mild mitral valve regurgitation. No evidence of mitral valve stenosis. MV peak gradient, 6.7 mmHg. The mean mitral valve gradient is 3.0 mmHg. Tricuspid Valve: The tricuspid valve is normal in structure. Tricuspid valve regurgitation is mild . No evidence of tricuspid stenosis. Aortic Valve: The aortic valve is normal in structure. Aortic valve regurgitation is trivial. No aortic stenosis is present. Aortic valve mean gradient measures 7.0 mmHg. Aortic valve peak gradient measures 14.1 mmHg. Aortic valve area, by VTI measures 1.73 cm. Pulmonic Valve: The pulmonic valve was normal in structure. Pulmonic valve regurgitation is not visualized. No evidence of pulmonic stenosis. Aorta: The aortic root is normal in size and structure. Venous: The inferior vena cava is normal in size with greater than 50% respiratory variability, suggesting right atrial pressure of 3 mmHg. IAS/Shunts: No atrial level shunt detected by color flow  Doppler.  LEFT VENTRICLE PLAX 2D LVIDd:         3.27 cm   Diastology LVIDs:          2.39 cm   LV e' medial:    6.74 cm/s LV PW:         1.10 cm   LV E/e' medial:  15.3 LV IVS:        1.14 cm   LV e' lateral:   7.18 cm/s LVOT diam:     1.80 cm   LV E/e' lateral: 14.3 LV SV:         61 LV SV Index:   38 LVOT Area:     2.54 cm  RIGHT VENTRICLE RV Basal diam:  2.83 cm RV S prime:     17.40 cm/s LEFT ATRIUM             Index        RIGHT ATRIUM          Index LA diam:        3.30 cm 2.07 cm/m   RA Area:     9.52 cm LA Vol (A2C):   44.4 ml 27.79 ml/m  RA Volume:   17.90 ml 11.20 ml/m LA Vol (A4C):   41.9 ml 26.22 ml/m LA Biplane Vol: 43.8 ml 27.41 ml/m  AORTIC VALVE                     PULMONIC VALVE AV Area (Vmax):    1.34 cm      PV Vmax:       1.03 m/s AV Area (Vmean):   1.34 cm      PV Vmean:      74.200 cm/s AV Area (VTI):     1.73 cm      PV VTI:        0.207 m AV Vmax:           188.00 cm/s   PV Peak grad:  4.2 mmHg AV Vmean:          116.000 cm/s  PV Mean grad:  3.0 mmHg AV VTI:            0.354 m AV Peak Grad:      14.1 mmHg AV Mean Grad:      7.0 mmHg LVOT Vmax:         98.80 cm/s LVOT Vmean:        61.200 cm/s LVOT VTI:          0.240 m LVOT/AV VTI ratio: 0.68  AORTA Ao Root diam: 2.90 cm MITRAL VALVE MV Area (PHT): 2.93 cm     SHUNTS MV Area VTI:   1.34 cm     Systemic VTI:  0.24 m MV Peak grad:  6.7 mmHg     Systemic Diam: 1.80 cm MV Mean grad:  3.0 mmHg MV Vmax:       1.29 m/s MV Vmean:      85.7 cm/s MV Decel Time: 259 msec MV E velocity: 103.00 cm/s MV A velocity: 122.00 cm/s MV E/A ratio:  0.84 Isaias Cowman MD Electronically signed by Isaias Cowman MD Signature Date/Time: 10/14/2021/1:46:45 PM    Final     Microbiology: Results for orders placed or performed during the hospital encounter of 10/10/21  SARS Coronavirus 2 by RT PCR (hospital order, performed in [facility] hospital lab) *cepheid single result test* Anterior Nasal Swab     Status: None   Collection Time:  10/10/21 10:20 AM   Specimen: Anterior Nasal Swab  Result Value Ref Range Status   SARS  Coronavirus 2 by RT PCR NEGATIVE NEGATIVE Final    Comment: (NOTE) SARS-CoV-2 target nucleic acids are NOT DETECTED.  The SARS-CoV-2 RNA is generally detectable in upper and lower respiratory specimens during the acute phase of infection. The lowest concentration of SARS-CoV-2 viral copies this assay can detect is 250 copies / mL. A negative result does not preclude SARS-CoV-2 infection and should not be used as the sole basis for treatment or other patient management decisions.  A negative result may occur with improper specimen collection / handling, submission of specimen other than nasopharyngeal swab, presence of viral mutation(s) within the areas targeted by this assay, and inadequate number of viral copies (<250 copies / mL). A negative result must be combined with clinical observations, patient history, and epidemiological information.  Fact Sheet for Patients:   https://www.patel.info/  Fact Sheet for Healthcare Providers: https://hall.com/  This test is not yet approved or  cleared by the [place] FDA and has been authorized for detection and/or diagnosis of SARS-CoV-2 by FDA under an Emergency Use Authorization (EUA).  This EUA will remain in effect (meaning this test can be used) for the duration of the COVID-19 declaration under Section 564(b)(1) of the Act, 21 U.S.C. section 360bbb-3(b)(1), unless the authorization is terminated or revoked sooner.  Performed at [facility], [address] [place], [place] [address]   MRSA Next Gen by PCR, Nasal     Status: None   Collection Time: 10/11/21  9:09 PM   Specimen: Nasal Mucosa; Nasal Swab  Result Value Ref Range Status   MRSA by PCR Next Gen NOT DETECTED NOT DETECTED Final    Comment: (NOTE) The GeneXpert MRSA Assay (FDA approved for NASAL specimens only), is one component of a comprehensive MRSA colonization surveillance program. It is not intended to  diagnose MRSA infection nor to guide or monitor treatment for MRSA infections. Test performance is not FDA approved in patients less than 58 years old. Performed at [facility], [address] [place], [place] [address]     Labs: CBC: Recent Labs  Lab 10/10/21 1019 10/11/21 0515 10/12/21 0411 10/13/21 0511 10/14/21 0414  WBC 13.2* 11.6* 9.5 9.2 10.7*  NEUTROABS 10.5*  --   --   --   --   HGB 12.8 11.9* 12.0 11.6* 12.8  HCT 43.3 39.7 38.4 36.0 40.3  MCV 105.4* 103.4* 100.8* 99.7 98.1  PLT 211 191 233 201 [id number]   Basic Metabolic Panel: Recent Labs  Lab 10/11/21 0515 10/11/21 2009 10/12/21 0411 10/13/21 0511 10/14/21 0414 10/15/21 0401  NA 139  --  142 137 138 136  K 3.5  --  3.9 4.0 4.5 3.3*  CL 90*  --  95* 92* 93* 93*  CO2 38*  --  37* 35* 37* 37*  GLUCOSE 131*  --  167* 142* 124* 119*  BUN 31*  --  28* 21 15 14   CREATININE 0.67  --  0.59 0.50 0.57 0.64  CALCIUM 9.6  --  8.8* 8.5* 9.0 8.4*  MG 2.5*  --  2.6* 2.4  --   --   PHOS 1.4* 3.4 2.3* 2.1* 2.4* 3.1   Liver Function Tests: Recent Labs  Lab 10/10/21 1019  AST 51*  ALT 25  ALKPHOS 72  BILITOT 0.7  PROT 7.5  ALBUMIN 3.7   CBG: Recent Labs  Lab 10/14/21 1153 10/14/21 1753 10/14/21  1757 10/15/21 0110 10/15/21 0802  GLUCAP 157* 329* 348* 257* 97    Discharge time spent: greater than 30 minutes.  This record has been created using Systems analyst. Errors have been sought and corrected,but may not always be located. Such creation errors do not reflect on the standard of care.   Signed: Lorella Nimrod, MD [facility] 10/15/2021

## 2021-10-15 NOTE — Plan of Care (Signed)
  Problem: Education: Goal: Knowledge of General Education information will improve Description: Including pain rating scale, medication(s)/side effects and non-pharmacologic comfort measures Outcome: Progressing   Problem: Health Behavior/Discharge Planning: Goal: Ability to manage health-related needs will improve Outcome: Progressing   Problem: Clinical Measurements: Goal: Ability to maintain clinical measurements within normal limits will improve Outcome: Progressing Goal: Respiratory complications will improve Outcome: Progressing   Problem: Safety: Goal: Ability to remain free from injury will improve Outcome: Progressing

## 2021-10-15 NOTE — Consult Note (Signed)
PHARMACY CONSULT NOTE  Pharmacy Consult for Electrolyte Monitoring and Replacement   Recent Labs: Potassium (mmol/L)  Date Value  10/15/2021 3.3 (L)  12/27/2013 3.5   Magnesium (mg/dL)  Date Value  69/45/0388 2.4   Calcium (mg/dL)  Date Value  82/80/0349 8.4 (L)   Calcium, Total (mg/dL)  Date Value  17/91/5056 8.8   Albumin (g/dL)  Date Value  97/94/8016 3.7  12/27/2013 3.4   Phosphorus (mg/dL)  Date Value  55/37/4827 3.1   Sodium (mmol/L)  Date Value  10/15/2021 136  03/26/2020 137  12/27/2013 138     Assessment: Pharmacy has been consulted to monitor and replace electrolytes in 75yo patient admitted with shortness of breath, arriving via EMS with initial pulse ox of 50%. CXR showing possible pneumonia vs CHF. -hx CHF No IVF currently running.  Goal of Therapy:  Electrolytes WNL  Plan:  -  K 3.3   Na 136   Scr 0.64 -on lasix 40 mg po daily 5/31 (changed from lasix 20 mg IV daily), spironolactone 25 mg daily -will order KCL PO 40 meq q4h x 2 doses -watch CHF/fluid/Na - f/u electrolytes with AM labs  Angelique Blonder ,PharmD Clinical Pharmacist 10/15/2021 7:22 AM

## 2021-10-15 NOTE — Plan of Care (Signed)
Patient is progressing with the care plan.

## 2021-10-15 NOTE — Progress Notes (Signed)
AVS given and reviewed with pt. Medications discussed. All questions answered to satisfaction. Rolling walker delivered to bedside. Pt verbalized understanding of information given. Pt escorted off the unit with all belongings via wheelchair by staff member.

## 2021-10-16 DIAGNOSIS — M25561 Pain in right knee: Secondary | ICD-10-CM | POA: Diagnosis not present

## 2021-10-16 DIAGNOSIS — S82009D Unspecified fracture of unspecified patella, subsequent encounter for closed fracture with routine healing: Secondary | ICD-10-CM | POA: Diagnosis not present

## 2021-10-16 DIAGNOSIS — S42402D Unspecified fracture of lower end of left humerus, subsequent encounter for fracture with routine healing: Secondary | ICD-10-CM | POA: Diagnosis not present

## 2021-10-17 ENCOUNTER — Inpatient Hospital Stay: Payer: Medicare Other | Admitting: Nurse Practitioner

## 2021-10-17 DIAGNOSIS — J9601 Acute respiratory failure with hypoxia: Secondary | ICD-10-CM | POA: Diagnosis not present

## 2021-10-17 DIAGNOSIS — K219 Gastro-esophageal reflux disease without esophagitis: Secondary | ICD-10-CM | POA: Diagnosis not present

## 2021-10-17 DIAGNOSIS — E039 Hypothyroidism, unspecified: Secondary | ICD-10-CM | POA: Diagnosis not present

## 2021-10-20 ENCOUNTER — Telehealth: Payer: Self-pay

## 2021-10-20 DIAGNOSIS — J449 Chronic obstructive pulmonary disease, unspecified: Secondary | ICD-10-CM | POA: Diagnosis not present

## 2021-10-20 NOTE — Telephone Encounter (Signed)
Attempted to contact patient to schedule hospital follow up. No answer, no vm-Toni

## 2021-10-21 ENCOUNTER — Telehealth: Payer: Self-pay

## 2021-10-21 ENCOUNTER — Ambulatory Visit: Payer: Medicare Other | Admitting: Nurse Practitioner

## 2021-10-21 DIAGNOSIS — E039 Hypothyroidism, unspecified: Secondary | ICD-10-CM | POA: Diagnosis not present

## 2021-10-21 DIAGNOSIS — J9601 Acute respiratory failure with hypoxia: Secondary | ICD-10-CM | POA: Diagnosis not present

## 2021-10-21 DIAGNOSIS — K219 Gastro-esophageal reflux disease without esophagitis: Secondary | ICD-10-CM | POA: Diagnosis not present

## 2021-10-21 NOTE — Telephone Encounter (Signed)
Gave verbal order to [facility] [phone]) for social worker

## 2021-10-21 NOTE — Patient Outreach (Signed)
Received a red flag Emmi stroke notification for Ms. [name] . I have assigned Francene Finders, RN to call for follow up and determine if there are any Case Management needs.    Iverson Alamin, Donivan Scull [facility] Care Management Assistant [facility] Care Management [phone]

## 2021-10-22 DIAGNOSIS — E039 Hypothyroidism, unspecified: Secondary | ICD-10-CM | POA: Diagnosis not present

## 2021-10-22 DIAGNOSIS — J9601 Acute respiratory failure with hypoxia: Secondary | ICD-10-CM | POA: Diagnosis not present

## 2021-10-22 DIAGNOSIS — K219 Gastro-esophageal reflux disease without esophagitis: Secondary | ICD-10-CM | POA: Diagnosis not present

## 2021-10-23 ENCOUNTER — Telehealth: Payer: Self-pay

## 2021-10-23 DIAGNOSIS — E039 Hypothyroidism, unspecified: Secondary | ICD-10-CM | POA: Diagnosis not present

## 2021-10-23 DIAGNOSIS — J9601 Acute respiratory failure with hypoxia: Secondary | ICD-10-CM | POA: Diagnosis not present

## 2021-10-23 DIAGNOSIS — K219 Gastro-esophageal reflux disease without esophagitis: Secondary | ICD-10-CM | POA: Diagnosis not present

## 2021-10-23 NOTE — Telephone Encounter (Signed)
Pt called about portable oxygen that she could carry around like a backpack. Spoke to [facility] and they said in order for pt to qualify for that she would have to use 8 tanks a month for 3 months in a row and she has not ordered tanks since March. Called pt and informed her of above info.

## 2021-10-23 NOTE — Telephone Encounter (Signed)
[facility] social worker called, [phone], they did an evaluation today and pt no longer needs a Education officer, museum.

## 2021-10-24 DIAGNOSIS — J9601 Acute respiratory failure with hypoxia: Secondary | ICD-10-CM | POA: Diagnosis not present

## 2021-10-24 DIAGNOSIS — E039 Hypothyroidism, unspecified: Secondary | ICD-10-CM | POA: Diagnosis not present

## 2021-10-24 DIAGNOSIS — K219 Gastro-esophageal reflux disease without esophagitis: Secondary | ICD-10-CM | POA: Diagnosis not present

## 2021-10-27 ENCOUNTER — Encounter: Payer: Self-pay | Admitting: Nurse Practitioner

## 2021-10-27 ENCOUNTER — Ambulatory Visit (INDEPENDENT_AMBULATORY_CARE_PROVIDER_SITE_OTHER): Payer: Medicare Other | Admitting: Nurse Practitioner

## 2021-10-27 VITALS — BP 139/69 | HR 85 | Temp 98.4°F | Resp 16 | Ht 66.0 in | Wt 118.2 lb

## 2021-10-27 DIAGNOSIS — Z9981 Dependence on supplemental oxygen: Secondary | ICD-10-CM | POA: Diagnosis not present

## 2021-10-27 DIAGNOSIS — J449 Chronic obstructive pulmonary disease, unspecified: Secondary | ICD-10-CM

## 2021-10-27 DIAGNOSIS — I5032 Chronic diastolic (congestive) heart failure: Secondary | ICD-10-CM

## 2021-10-27 DIAGNOSIS — E039 Hypothyroidism, unspecified: Secondary | ICD-10-CM

## 2021-10-27 DIAGNOSIS — Z09 Encounter for follow-up examination after completed treatment for conditions other than malignant neoplasm: Secondary | ICD-10-CM

## 2021-10-27 DIAGNOSIS — E119 Type 2 diabetes mellitus without complications: Secondary | ICD-10-CM

## 2021-10-27 DIAGNOSIS — R0902 Hypoxemia: Secondary | ICD-10-CM | POA: Diagnosis not present

## 2021-10-27 DIAGNOSIS — F5101 Primary insomnia: Secondary | ICD-10-CM

## 2021-10-27 DIAGNOSIS — I10 Essential (primary) hypertension: Secondary | ICD-10-CM

## 2021-10-27 DIAGNOSIS — I700 Atherosclerosis of aorta: Secondary | ICD-10-CM

## 2021-10-27 DIAGNOSIS — R0602 Shortness of breath: Secondary | ICD-10-CM | POA: Diagnosis not present

## 2021-10-27 DIAGNOSIS — J9611 Chronic respiratory failure with hypoxia: Secondary | ICD-10-CM

## 2021-10-27 DIAGNOSIS — K219 Gastro-esophageal reflux disease without esophagitis: Secondary | ICD-10-CM

## 2021-10-27 MED ORDER — SYMBICORT 160-4.5 MCG/ACT IN AERO: 2.0000 | INHALATION_SPRAY | Freq: Two times a day (BID) | RESPIRATORY_TRACT | 3 refills | Status: DC

## 2021-10-27 MED ORDER — PANTOPRAZOLE SODIUM 40 MG PO TBEC: 40.0000 mg | DELAYED_RELEASE_TABLET | Freq: Two times a day (BID) | ORAL | 1 refills | Status: DC

## 2021-10-27 MED ORDER — SPIRONOLACTONE 25 MG PO TABS: 25.0000 mg | ORAL_TABLET | Freq: Every day | ORAL | 1 refills | Status: DC

## 2021-10-27 MED ORDER — FUROSEMIDE 40 MG PO TABS: 40.0000 mg | ORAL_TABLET | Freq: Every day | ORAL | 1 refills | Status: DC

## 2021-10-27 NOTE — Progress Notes (Signed)
[facility] [address], [address] [place] [address] [facility] Discharge Acute Issues Care Follow Up                                                                        Patient Demographics  [name], is a 76 y.o. female  DOB 06-10-1945  MRN [id number].  Primary MD  Jonetta Osgood, NP   Reason for TCC follow Up -acute respiratory failure with hypoxia and hypercarbia   Past Medical History:  Diagnosis Date   Anxiety    Chest pain    CHF (congestive heart failure) (HCC)    COPD (chronic obstructive pulmonary disease) (HCC)    Depression    Diabetes mellitus without complication (HCC)    Fibromyalgia    Graves disease    Hypertension    IBS (irritable bowel syndrome)    PTSD (post-traumatic stress disorder)    Sinus problem    Spinal stenosis     Past Surgical History:  Procedure Laterality Date   ABDOMINAL HYSTERECTOMY     CAST APPLICATION  [id number]   Procedure: Application of long-leg hinged brace right lower extremity.;  Surgeon: Corky Mull, MD;  Location: [facility] ORS;  Service: Orthopedics;;   ESOPHAGOGASTRODUODENOSCOPY (EGD) WITH PROPOFOL N/A 01/10/2015   Procedure: ESOPHAGOGASTRODUODENOSCOPY (EGD) WITH PROPOFOL;  Surgeon: Hulen Luster, MD;  Location: [facility] ENDOSCOPY;  Service: Gastroenterology;  Laterality: N/A;   ORIF ELBOW FRACTURE Left 11/19/2020   Procedure: OPEN REDUCTION INTERNAL FIXATION (ORIF) ELBOW/OLECRANON FRACTURE;  Surgeon: Corky Mull, MD;  Location: [facility] ORS;  Service: Orthopedics;  Laterality: Left;   RECTAL SURGERY     RIGHT/LEFT HEART CATH AND CORONARY ANGIOGRAPHY Bilateral 04/08/2020   Procedure: RIGHT/LEFT HEART CATH AND CORONARY ANGIOGRAPHY;  Surgeon: Wellington Hampshire, MD;  Location: [place] CV LAB;  Service: Cardiovascular;  Laterality: Bilateral;   SAVORY DILATION N/A 01/10/2015   Procedure: SAVORY DILATION;  Surgeon:  Hulen Luster, MD;  Location: [facility] ENDOSCOPY;  Service: Gastroenterology;  Laterality: N/A;   toenail removal Bilateral    great toes   TONSILLECTOMY AND ADENOIDECTOMY         Recent HPI and Hospital Course  Hospital Course: Ms. Bodenheimer is a 76 year old female, PMH COPD, CHF, DM 2, anxiety/depression, fibromyalgia, thyroid disorder.   Presented to ED 10/10/2021 via EMS, found at her apartment on the floor by home health aide.  Per EMS, initial pulse ox 50% on room air, treated with DuoNebs, Solu-Medrol, magnesium, still hypoxic/somnolent to report to ED provider she had been having several days of SOB, productive cough, fever.  Placed on noninvasive ventilation, started on antibiotics with ceftriaxone and azithromycin.  Admitted to ICU/critical care service for COPD exacerbation, started on BiPAP, maintain S ABA, steroids, CTX/AZM.  ABG corrected to chronic compensation levels.   10/13/2021 was able to be downgraded from stepdown to med/surg.  PT/OT recommend home health. 10/14/2021: Saturating in mid 90s to 100% on 4 L/min O2 [place]. 5/31: Patient continued to  improve and now at her baseline oxygen requirement of 2 L.  She also developed vaginal itching and burning, stating that she to get yeast infections with antibiotics.  She was given a dose of Diflucan, we also give her 2 more pills to be used in 3 days if her symptoms persist.   She is being discharged home with home health services.  [facility] Acute Care Issue to be followed in the Clinic   Assessment and Plan: * Acute respiratory failure with hypoxia and hypercarbia (HCC) Initially admitted to ICU/stepdown, able to be weaned off BiPAP to room air, still requiring 4 L nasal cannula Antibiotics changed from ceftriaxone/azithromycin to oral doxycycline. (today is Day 5 abx) Continuing DuoNeb every 6 h and as needed Prednisone 50 mg p.o. daily down from IV SoluMedrol, can d/c tomorrow (total 5 days steroids) Goal: wean to room air vs  evaluate for home O2 need    Hypothyroidism Cont home meds    GERD (gastroesophageal reflux disease) Started PPI Can continue outpatient, follow w/ PCP    Mental health disorder Continue home meds: fluoxetine, mirtazapine    Chronic heart failure with preserved ejection fraction (HFpEF) ([place]) No exacerbation Repeat Echo pending IV --> po lasix    Controlled type 2 diabetes mellitus without complication, without long-term current use of insulin ([place]) SSI while inpatient Resume home meds on discharge    Subjective:   [name] today has, No headache, No chest pain, No abdominal pain - No Nausea, No new weakness tingling or numbness, No Cough - SOB. No significant symptoms, respiratory status is stable.   Assessment & Plan   1. Hospital discharge follow-up Discharged with home health services, stable at this time.   2. COPD with hypoxia ([place]) Chronic but stable, refills ordered.  - SYMBICORT 160-4.5 MCG/ACT inhaler; Inhale 2 puffs into the lungs 2 (two) times daily.  Dispense: 1 each; Refill: 3  3. Chronic respiratory failure with hypoxia (HCC) Stabilized while in hospital and on supplemental home oxygen.  - furosemide (LASIX) 40 MG tablet; Take 1 tablet (40 mg total) by mouth daily.  Dispense: 90 tablet; Refill: 1 - spironolactone (ALDACTONE) 25 MG tablet; Take 1 tablet (25 mg total) by mouth daily.  Dispense: 90 tablet; Refill: 1  4. Chronic heart failure with preserved ejection fraction (HFpEF) (HCC) Stable chronic problem, diuretic refills ordered, followed by specialist  - furosemide (LASIX) 40 MG tablet; Take 1 tablet (40 mg total) by mouth daily.  Dispense: 90 tablet; Refill: 1 - spironolactone (ALDACTONE) 25 MG tablet; Take 1 tablet (25 mg total) by mouth daily.  Dispense: 90 tablet; Refill: 1  5. Oxygen dependent stable   6. Gastroesophageal reflux disease without esophagitis Refills ordered.  - pantoprazole (PROTONIX) 40 MG tablet; Take 1 tablet (40 mg  total) by mouth 2 (two) times daily.  Dispense: 180 tablet; Refill: 1    Reason for frequent admissions/ER visits    CHF Chronic respiratory failure with hypoxia COPD PSVT PTSD Spinal stenosis Osteoporosis, pathological fracture - has history of fractures.   Objective:   Vitals:   10/27/21 1107  BP: 139/69  Pulse: 85  Resp: 16  Temp: 98.4 F (36.9 C)  SpO2: 97%  Weight: 118 lb 3.2 oz (53.6 kg)  Height: 5\' 6"  (1.676 m)    Wt Readings from Last 3 Encounters:  10/27/21 118 lb 3.2 oz (53.6 kg)  10/15/21 117 lb 15.1 oz (53.5 kg)  10/06/21 121 lb 9.6 oz (55.2 kg)  Allergies as of 10/27/2021       Reactions   Celecoxib    Other reaction(s): Other (qualifier value) Patient is allergic to Celebrex and found that it caused heart failure. CHF   Pregabalin    Other reaction(s): Localized superficial swelling of skin        Medication List        Accurate as of October 27, 2021 11:51 AM. If you have any questions, ask your nurse or doctor.          STOP taking these medications    predniSONE 10 MG tablet Commonly known as: DELTASONE Stopped by: Sallyanne Kuster, NP       TAKE these medications    acetaminophen 500 MG tablet Commonly known as: TYLENOL Take 1,000 mg by mouth in the morning, at noon, and at bedtime.   albuterol 108 (90 Base) MCG/ACT inhaler Commonly known as: VENTOLIN HFA INAHLE 2 PUFFS INTO THE LUNGS EVERY 6 HOURS AS NEEDED FOR WHEEZING OR SHORTNESS OF BREATH   diphenhydrAMINE 25 MG tablet Commonly known as: BENADRYL Take 50 mg by mouth 2 (two) times daily.   Ergocalciferol 50 MCG (2000 UT) Tabs Take 2 capsules by mouth daily.   FLUoxetine 40 MG capsule Commonly known as: PROZAC Take 1 capsule (40 mg total) by mouth daily as needed.   fluticasone 50 MCG/ACT nasal spray Commonly known as: FLONASE Place 2 sprays into both nostrils in the morning and at bedtime.   furosemide 40 MG tablet Commonly known as: LASIX Take 1 tablet  (40 mg total) by mouth daily.   hydrocortisone 2.5 % cream Place 1 application rectally 2 (two) times daily as needed (itching/hemorroids).   levothyroxine 88 MCG tablet Commonly known as: SYNTHROID Take 1 tablet (88 mcg total) by mouth daily before breakfast. Take 30 minutes before food and with a full glass of water   LORazepam 1 MG tablet Commonly known as: ATIVAN Take 1 tablet (1 mg total) by mouth 2 (two) times daily as needed for anxiety.   lubiprostone 8 MCG capsule Commonly known as: AMITIZA Take 1 capsule (8 mcg total) by mouth daily with breakfast.   mirtazapine 7.5 MG tablet Commonly known as: REMERON Take 7.5 mg by mouth at bedtime.   OXYGEN Inhale into the lungs. Pt is on 2 liters oxygen 24 hrs uses [facility]   pantoprazole 40 MG tablet Commonly known as: PROTONIX Take 1 tablet (40 mg total) by mouth 2 (two) times daily. What changed: when to take this Changed by: Sallyanne Kuster, NP   rosuvastatin 20 MG tablet Commonly known as: Crestor Take 1 tablet (20 mg total) by mouth daily.   Spiriva HandiHaler 18 MCG inhalation capsule Generic drug: tiotropium Place 1 capsule (18 mcg total) into inhaler and inhale daily.   spironolactone 25 MG tablet Commonly known as: ALDACTONE Take 1 tablet (25 mg total) by mouth daily.   Symbicort 160-4.5 MCG/ACT inhaler Generic drug: budesonide-formoterol Inhale 2 puffs into the lungs 2 (two) times daily.   traMADol 50 MG tablet Commonly known as: ULTRAM Take 1 tablet (50 mg total) by mouth 2 (two) times daily as needed for moderate pain or severe pain.         Physical Exam: Constitutional: Patient appears well-developed and well-nourished. Not in obvious distress. HENT: Normocephalic, atraumatic, External right and left ear normal. Oropharynx is clear and moist.  Eyes: Conjunctivae and EOM are normal. PERRLA, no scleral icterus. Neck: Normal ROM. Neck supple. No JVD. No tracheal  deviation. No  thyromegaly. CVS: RRR, S1/S2 +, no murmurs, no gallops, no carotid bruit.  Pulmonary: Effort and breath sounds normal, no stridor, rhonchi, wheezes, rales.  Abdominal: Soft. BS +, no distension, tenderness, rebound or guarding.  Musculoskeletal: Normal range of motion. No edema and no tenderness.  Lymphadenopathy: No lymphadenopathy noted, cervical, inguinal or axillary Neuro: Alert. Normal reflexes, muscle tone coordination. No cranial nerve deficit. Skin: Skin is warm and dry. No rash noted. Not diaphoretic. No erythema. No pallor. Psychiatric: Normal mood and affect. Behavior, judgment, thought content normal.   Data Review   Micro Results No results found for this or any previous visit (from the past 240 hour(s)).   CBC No results for input(s): "WBC", "HGB", "HCT", "PLT", "MCV", "MCH", "MCHC", "RDW", "LYMPHSABS", "MONOABS", "EOSABS", "BASOSABS", "BANDABS" in the last 168 hours.  Invalid input(s): "NEUTRABS", "BANDSABD"  Chemistries  No results for input(s): "NA", "K", "CL", "CO2", "GLUCOSE", "BUN", "CREATININE", "CALCIUM", "MG", "AST", "ALT", "ALKPHOS", "BILITOT" in the last 168 hours.  Invalid input(s): "GFRCGP" ------------------------------------------------------------------------------------------------------------------ estimated creatinine clearance is 50.6 mL/min (by C-G formula based on SCr of 0.64 mg/dL). ------------------------------------------------------------------------------------------------------------------ No results for input(s): "HGBA1C" in the last 72 hours. ------------------------------------------------------------------------------------------------------------------ No results for input(s): "CHOL", "HDL", "LDLCALC", "TRIG", "CHOLHDL", "LDLDIRECT" in the last 72 hours. ------------------------------------------------------------------------------------------------------------------ No results for input(s): "TSH", "T4TOTAL", "T3FREE", "THYROIDAB" in  the last 72 hours.  Invalid input(s): "FREET3" ------------------------------------------------------------------------------------------------------------------ No results for input(s): "VITAMINB12", "FOLATE", "FERRITIN", "TIBC", "IRON", "RETICCTPCT" in the last 72 hours.  Coagulation profile No results for input(s): "INR", "PROTIME" in the last 168 hours.  No results for input(s): "DDIMER" in the last 72 hours.  Cardiac Enzymes No results for input(s): "CKMB", "TROPONINI", "MYOGLOBIN" in the last 168 hours.  Invalid input(s): "CK" ------------------------------------------------------------------------------------------------------------------ Invalid input(s): "POCBNP"  Return in 10 weeks (on 01/05/2022) for F/U, med refill, Nani Ingram PCP , previously scheduled.   Time Spent in minutes  45 Time spent with patient included reviewing progress notes, labs, imaging studies, and discussing plan for follow up.   This patient was seen by Jonetta Osgood, FNP-C in collaboration with Dr. Clayborn Bigness as a part of collaborative care agreement.   Jonetta Osgood MSN, FNP-C on 10/27/2021 at 11:51 AM   **Disclaimer: This note may have been dictated with voice recognition software. Similar sounding words can inadvertently be transcribed and this note may contain transcription errors which may not have been corrected upon publication of note.**

## 2021-10-28 DIAGNOSIS — K219 Gastro-esophageal reflux disease without esophagitis: Secondary | ICD-10-CM | POA: Diagnosis not present

## 2021-10-28 DIAGNOSIS — E039 Hypothyroidism, unspecified: Secondary | ICD-10-CM | POA: Diagnosis not present

## 2021-10-28 DIAGNOSIS — J9601 Acute respiratory failure with hypoxia: Secondary | ICD-10-CM | POA: Diagnosis not present

## 2021-10-29 DIAGNOSIS — K219 Gastro-esophageal reflux disease without esophagitis: Secondary | ICD-10-CM | POA: Diagnosis not present

## 2021-10-29 DIAGNOSIS — J9601 Acute respiratory failure with hypoxia: Secondary | ICD-10-CM | POA: Diagnosis not present

## 2021-10-29 DIAGNOSIS — E039 Hypothyroidism, unspecified: Secondary | ICD-10-CM | POA: Diagnosis not present

## 2021-10-31 ENCOUNTER — Other Ambulatory Visit: Payer: Self-pay

## 2021-10-31 DIAGNOSIS — E039 Hypothyroidism, unspecified: Secondary | ICD-10-CM | POA: Diagnosis not present

## 2021-10-31 DIAGNOSIS — K219 Gastro-esophageal reflux disease without esophagitis: Secondary | ICD-10-CM | POA: Diagnosis not present

## 2021-10-31 DIAGNOSIS — J9601 Acute respiratory failure with hypoxia: Secondary | ICD-10-CM | POA: Diagnosis not present

## 2021-10-31 DIAGNOSIS — J449 Chronic obstructive pulmonary disease, unspecified: Secondary | ICD-10-CM

## 2021-10-31 MED ORDER — ALBUTEROL SULFATE HFA 108 (90 BASE) MCG/ACT IN AERS: INHALATION_SPRAY | RESPIRATORY_TRACT | 3 refills | Status: DC

## 2021-11-03 DIAGNOSIS — E039 Hypothyroidism, unspecified: Secondary | ICD-10-CM | POA: Diagnosis not present

## 2021-11-03 DIAGNOSIS — J9601 Acute respiratory failure with hypoxia: Secondary | ICD-10-CM | POA: Diagnosis not present

## 2021-11-03 DIAGNOSIS — K219 Gastro-esophageal reflux disease without esophagitis: Secondary | ICD-10-CM | POA: Diagnosis not present

## 2021-11-04 ENCOUNTER — Telehealth: Payer: Self-pay

## 2021-11-04 DIAGNOSIS — Z9181 History of falling: Secondary | ICD-10-CM | POA: Diagnosis not present

## 2021-11-04 DIAGNOSIS — Z9981 Dependence on supplemental oxygen: Secondary | ICD-10-CM | POA: Diagnosis not present

## 2021-11-04 DIAGNOSIS — E039 Hypothyroidism, unspecified: Secondary | ICD-10-CM | POA: Diagnosis not present

## 2021-11-04 DIAGNOSIS — Z79899 Other long term (current) drug therapy: Secondary | ICD-10-CM | POA: Diagnosis not present

## 2021-11-04 DIAGNOSIS — K589 Irritable bowel syndrome without diarrhea: Secondary | ICD-10-CM | POA: Diagnosis not present

## 2021-11-04 DIAGNOSIS — F32A Depression, unspecified: Secondary | ICD-10-CM | POA: Diagnosis not present

## 2021-11-04 DIAGNOSIS — J9601 Acute respiratory failure with hypoxia: Secondary | ICD-10-CM | POA: Diagnosis not present

## 2021-11-04 DIAGNOSIS — J441 Chronic obstructive pulmonary disease with (acute) exacerbation: Secondary | ICD-10-CM | POA: Diagnosis not present

## 2021-11-04 DIAGNOSIS — M4800 Spinal stenosis, site unspecified: Secondary | ICD-10-CM | POA: Diagnosis not present

## 2021-11-04 DIAGNOSIS — K219 Gastro-esophageal reflux disease without esophagitis: Secondary | ICD-10-CM | POA: Diagnosis not present

## 2021-11-04 DIAGNOSIS — E119 Type 2 diabetes mellitus without complications: Secondary | ICD-10-CM | POA: Diagnosis not present

## 2021-11-04 DIAGNOSIS — J9602 Acute respiratory failure with hypercapnia: Secondary | ICD-10-CM | POA: Diagnosis not present

## 2021-11-04 DIAGNOSIS — M797 Fibromyalgia: Secondary | ICD-10-CM | POA: Diagnosis not present

## 2021-11-04 DIAGNOSIS — Z7951 Long term (current) use of inhaled steroids: Secondary | ICD-10-CM | POA: Diagnosis not present

## 2021-11-04 DIAGNOSIS — I5022 Chronic systolic (congestive) heart failure: Secondary | ICD-10-CM | POA: Diagnosis not present

## 2021-11-04 DIAGNOSIS — I110 Hypertensive heart disease with heart failure: Secondary | ICD-10-CM | POA: Diagnosis not present

## 2021-11-04 DIAGNOSIS — Z79891 Long term (current) use of opiate analgesic: Secondary | ICD-10-CM | POA: Diagnosis not present

## 2021-11-04 DIAGNOSIS — Z87891 Personal history of nicotine dependence: Secondary | ICD-10-CM | POA: Diagnosis not present

## 2021-11-04 DIAGNOSIS — E0500 Thyrotoxicosis with diffuse goiter without thyrotoxic crisis or storm: Secondary | ICD-10-CM | POA: Diagnosis not present

## 2021-11-04 NOTE — Telephone Encounter (Signed)
Gave verbal order to [facility] [phone] for Physical therapy once a week for 1 week ,twice a week for 3 week and once a week for 5 week

## 2021-11-04 NOTE — Telephone Encounter (Signed)
[name] [phone]) from [facility] called and requested verbal orders for OT 2 x a week for 8 weeks.  I gave the verbal approval for orders.

## 2021-11-04 NOTE — Telephone Encounter (Signed)
Faxed request for Independent Assessment for Personal Care Services Attestation of Medical Need [phone]) form to [facility] care @ [phone] at 546 pm on 11/04/21

## 2021-11-06 ENCOUNTER — Telehealth: Payer: Self-pay

## 2021-11-06 DIAGNOSIS — E0500 Thyrotoxicosis with diffuse goiter without thyrotoxic crisis or storm: Secondary | ICD-10-CM | POA: Diagnosis not present

## 2021-11-06 DIAGNOSIS — R0689 Other abnormalities of breathing: Secondary | ICD-10-CM | POA: Diagnosis not present

## 2021-11-06 DIAGNOSIS — J441 Chronic obstructive pulmonary disease with (acute) exacerbation: Secondary | ICD-10-CM | POA: Diagnosis not present

## 2021-11-06 DIAGNOSIS — Z79891 Long term (current) use of opiate analgesic: Secondary | ICD-10-CM | POA: Diagnosis not present

## 2021-11-06 DIAGNOSIS — I110 Hypertensive heart disease with heart failure: Secondary | ICD-10-CM | POA: Diagnosis not present

## 2021-11-06 DIAGNOSIS — K589 Irritable bowel syndrome without diarrhea: Secondary | ICD-10-CM | POA: Diagnosis not present

## 2021-11-06 DIAGNOSIS — E039 Hypothyroidism, unspecified: Secondary | ICD-10-CM | POA: Diagnosis not present

## 2021-11-06 DIAGNOSIS — F32A Depression, unspecified: Secondary | ICD-10-CM | POA: Diagnosis not present

## 2021-11-06 DIAGNOSIS — Z7951 Long term (current) use of inhaled steroids: Secondary | ICD-10-CM | POA: Diagnosis not present

## 2021-11-06 DIAGNOSIS — E119 Type 2 diabetes mellitus without complications: Secondary | ICD-10-CM | POA: Diagnosis not present

## 2021-11-06 DIAGNOSIS — I5022 Chronic systolic (congestive) heart failure: Secondary | ICD-10-CM | POA: Diagnosis not present

## 2021-11-06 DIAGNOSIS — K219 Gastro-esophageal reflux disease without esophagitis: Secondary | ICD-10-CM | POA: Diagnosis not present

## 2021-11-06 DIAGNOSIS — Z9181 History of falling: Secondary | ICD-10-CM | POA: Diagnosis not present

## 2021-11-06 DIAGNOSIS — M4800 Spinal stenosis, site unspecified: Secondary | ICD-10-CM | POA: Diagnosis not present

## 2021-11-06 DIAGNOSIS — Z79899 Other long term (current) drug therapy: Secondary | ICD-10-CM | POA: Diagnosis not present

## 2021-11-06 DIAGNOSIS — J9601 Acute respiratory failure with hypoxia: Secondary | ICD-10-CM | POA: Diagnosis not present

## 2021-11-06 DIAGNOSIS — M797 Fibromyalgia: Secondary | ICD-10-CM | POA: Diagnosis not present

## 2021-11-06 DIAGNOSIS — J9602 Acute respiratory failure with hypercapnia: Secondary | ICD-10-CM | POA: Diagnosis not present

## 2021-11-06 DIAGNOSIS — Z87891 Personal history of nicotine dependence: Secondary | ICD-10-CM | POA: Diagnosis not present

## 2021-11-06 DIAGNOSIS — Z9981 Dependence on supplemental oxygen: Secondary | ICD-10-CM | POA: Diagnosis not present

## 2021-11-12 DIAGNOSIS — J9601 Acute respiratory failure with hypoxia: Secondary | ICD-10-CM | POA: Diagnosis not present

## 2021-11-12 DIAGNOSIS — Z87891 Personal history of nicotine dependence: Secondary | ICD-10-CM | POA: Diagnosis not present

## 2021-11-12 DIAGNOSIS — K589 Irritable bowel syndrome without diarrhea: Secondary | ICD-10-CM | POA: Diagnosis not present

## 2021-11-12 DIAGNOSIS — Z7951 Long term (current) use of inhaled steroids: Secondary | ICD-10-CM | POA: Diagnosis not present

## 2021-11-12 DIAGNOSIS — M797 Fibromyalgia: Secondary | ICD-10-CM | POA: Diagnosis not present

## 2021-11-12 DIAGNOSIS — E0500 Thyrotoxicosis with diffuse goiter without thyrotoxic crisis or storm: Secondary | ICD-10-CM | POA: Diagnosis not present

## 2021-11-12 DIAGNOSIS — Z79899 Other long term (current) drug therapy: Secondary | ICD-10-CM | POA: Diagnosis not present

## 2021-11-12 DIAGNOSIS — K219 Gastro-esophageal reflux disease without esophagitis: Secondary | ICD-10-CM | POA: Diagnosis not present

## 2021-11-12 DIAGNOSIS — J449 Chronic obstructive pulmonary disease, unspecified: Secondary | ICD-10-CM | POA: Diagnosis not present

## 2021-11-12 DIAGNOSIS — Z9981 Dependence on supplemental oxygen: Secondary | ICD-10-CM | POA: Diagnosis not present

## 2021-11-12 DIAGNOSIS — R0689 Other abnormalities of breathing: Secondary | ICD-10-CM | POA: Diagnosis not present

## 2021-11-12 DIAGNOSIS — Z9181 History of falling: Secondary | ICD-10-CM | POA: Diagnosis not present

## 2021-11-12 DIAGNOSIS — J441 Chronic obstructive pulmonary disease with (acute) exacerbation: Secondary | ICD-10-CM | POA: Diagnosis not present

## 2021-11-12 DIAGNOSIS — E039 Hypothyroidism, unspecified: Secondary | ICD-10-CM | POA: Diagnosis not present

## 2021-11-12 DIAGNOSIS — F32A Depression, unspecified: Secondary | ICD-10-CM | POA: Diagnosis not present

## 2021-11-12 DIAGNOSIS — Z79891 Long term (current) use of opiate analgesic: Secondary | ICD-10-CM | POA: Diagnosis not present

## 2021-11-12 DIAGNOSIS — E119 Type 2 diabetes mellitus without complications: Secondary | ICD-10-CM | POA: Diagnosis not present

## 2021-11-12 DIAGNOSIS — M4800 Spinal stenosis, site unspecified: Secondary | ICD-10-CM | POA: Diagnosis not present

## 2021-11-12 DIAGNOSIS — I110 Hypertensive heart disease with heart failure: Secondary | ICD-10-CM | POA: Diagnosis not present

## 2021-11-12 DIAGNOSIS — I5022 Chronic systolic (congestive) heart failure: Secondary | ICD-10-CM | POA: Diagnosis not present

## 2021-11-12 DIAGNOSIS — J9602 Acute respiratory failure with hypercapnia: Secondary | ICD-10-CM | POA: Diagnosis not present

## 2021-11-17 ENCOUNTER — Telehealth: Payer: Self-pay

## 2021-11-17 NOTE — Telephone Encounter (Signed)
Pt called asking about the [id number] paperwork to be completed.  We had faxed completed forms and notes to [facility] on 11/06/21.  I called [facility] on 11/17/21 ans spoke to [place] and she advised that all the forms were correctly filled out and the the patient will get an assessment with a state nurse and then the nurse will send the assessment to a branch of medicaid and they will determine if she is able to get more hours for care.   Advised pt that if she doesn't hear anything from them by ext week to call them Pt agreed she understood

## 2021-11-18 DIAGNOSIS — K589 Irritable bowel syndrome without diarrhea: Secondary | ICD-10-CM | POA: Diagnosis not present

## 2021-11-18 DIAGNOSIS — Z79899 Other long term (current) drug therapy: Secondary | ICD-10-CM | POA: Diagnosis not present

## 2021-11-18 DIAGNOSIS — Z87891 Personal history of nicotine dependence: Secondary | ICD-10-CM | POA: Diagnosis not present

## 2021-11-18 DIAGNOSIS — Z9181 History of falling: Secondary | ICD-10-CM | POA: Diagnosis not present

## 2021-11-18 DIAGNOSIS — J441 Chronic obstructive pulmonary disease with (acute) exacerbation: Secondary | ICD-10-CM | POA: Diagnosis not present

## 2021-11-18 DIAGNOSIS — M4800 Spinal stenosis, site unspecified: Secondary | ICD-10-CM | POA: Diagnosis not present

## 2021-11-18 DIAGNOSIS — J9602 Acute respiratory failure with hypercapnia: Secondary | ICD-10-CM | POA: Diagnosis not present

## 2021-11-18 DIAGNOSIS — F32A Depression, unspecified: Secondary | ICD-10-CM | POA: Diagnosis not present

## 2021-11-18 DIAGNOSIS — R0689 Other abnormalities of breathing: Secondary | ICD-10-CM | POA: Diagnosis not present

## 2021-11-18 DIAGNOSIS — Z7951 Long term (current) use of inhaled steroids: Secondary | ICD-10-CM | POA: Diagnosis not present

## 2021-11-18 DIAGNOSIS — E039 Hypothyroidism, unspecified: Secondary | ICD-10-CM | POA: Diagnosis not present

## 2021-11-18 DIAGNOSIS — Z9981 Dependence on supplemental oxygen: Secondary | ICD-10-CM | POA: Diagnosis not present

## 2021-11-18 DIAGNOSIS — E0500 Thyrotoxicosis with diffuse goiter without thyrotoxic crisis or storm: Secondary | ICD-10-CM | POA: Diagnosis not present

## 2021-11-18 DIAGNOSIS — I5022 Chronic systolic (congestive) heart failure: Secondary | ICD-10-CM | POA: Diagnosis not present

## 2021-11-18 DIAGNOSIS — I110 Hypertensive heart disease with heart failure: Secondary | ICD-10-CM | POA: Diagnosis not present

## 2021-11-18 DIAGNOSIS — K219 Gastro-esophageal reflux disease without esophagitis: Secondary | ICD-10-CM | POA: Diagnosis not present

## 2021-11-18 DIAGNOSIS — M797 Fibromyalgia: Secondary | ICD-10-CM | POA: Diagnosis not present

## 2021-11-18 DIAGNOSIS — E119 Type 2 diabetes mellitus without complications: Secondary | ICD-10-CM | POA: Diagnosis not present

## 2021-11-18 DIAGNOSIS — Z79891 Long term (current) use of opiate analgesic: Secondary | ICD-10-CM | POA: Diagnosis not present

## 2021-11-18 DIAGNOSIS — J9601 Acute respiratory failure with hypoxia: Secondary | ICD-10-CM | POA: Diagnosis not present

## 2021-11-19 ENCOUNTER — Encounter: Payer: Self-pay | Admitting: Physician Assistant

## 2021-11-19 DIAGNOSIS — F32A Depression, unspecified: Secondary | ICD-10-CM | POA: Diagnosis not present

## 2021-11-19 DIAGNOSIS — M4800 Spinal stenosis, site unspecified: Secondary | ICD-10-CM | POA: Diagnosis not present

## 2021-11-19 DIAGNOSIS — E039 Hypothyroidism, unspecified: Secondary | ICD-10-CM | POA: Diagnosis not present

## 2021-11-19 DIAGNOSIS — Z79899 Other long term (current) drug therapy: Secondary | ICD-10-CM | POA: Diagnosis not present

## 2021-11-19 DIAGNOSIS — E0500 Thyrotoxicosis with diffuse goiter without thyrotoxic crisis or storm: Secondary | ICD-10-CM | POA: Diagnosis not present

## 2021-11-19 DIAGNOSIS — Z79891 Long term (current) use of opiate analgesic: Secondary | ICD-10-CM | POA: Diagnosis not present

## 2021-11-19 DIAGNOSIS — K219 Gastro-esophageal reflux disease without esophagitis: Secondary | ICD-10-CM | POA: Diagnosis not present

## 2021-11-19 DIAGNOSIS — J9601 Acute respiratory failure with hypoxia: Secondary | ICD-10-CM | POA: Diagnosis not present

## 2021-11-19 DIAGNOSIS — I110 Hypertensive heart disease with heart failure: Secondary | ICD-10-CM | POA: Diagnosis not present

## 2021-11-19 DIAGNOSIS — E119 Type 2 diabetes mellitus without complications: Secondary | ICD-10-CM | POA: Diagnosis not present

## 2021-11-19 DIAGNOSIS — Z87891 Personal history of nicotine dependence: Secondary | ICD-10-CM | POA: Diagnosis not present

## 2021-11-19 DIAGNOSIS — Z9181 History of falling: Secondary | ICD-10-CM | POA: Diagnosis not present

## 2021-11-19 DIAGNOSIS — Z7951 Long term (current) use of inhaled steroids: Secondary | ICD-10-CM | POA: Diagnosis not present

## 2021-11-19 DIAGNOSIS — R0689 Other abnormalities of breathing: Secondary | ICD-10-CM | POA: Diagnosis not present

## 2021-11-19 DIAGNOSIS — K589 Irritable bowel syndrome without diarrhea: Secondary | ICD-10-CM | POA: Diagnosis not present

## 2021-11-19 DIAGNOSIS — I5022 Chronic systolic (congestive) heart failure: Secondary | ICD-10-CM | POA: Diagnosis not present

## 2021-11-19 DIAGNOSIS — J9602 Acute respiratory failure with hypercapnia: Secondary | ICD-10-CM | POA: Diagnosis not present

## 2021-11-19 DIAGNOSIS — M797 Fibromyalgia: Secondary | ICD-10-CM | POA: Diagnosis not present

## 2021-11-19 DIAGNOSIS — J441 Chronic obstructive pulmonary disease with (acute) exacerbation: Secondary | ICD-10-CM | POA: Diagnosis not present

## 2021-11-19 DIAGNOSIS — Z9981 Dependence on supplemental oxygen: Secondary | ICD-10-CM | POA: Diagnosis not present

## 2021-11-25 DIAGNOSIS — M4800 Spinal stenosis, site unspecified: Secondary | ICD-10-CM | POA: Diagnosis not present

## 2021-11-25 DIAGNOSIS — E039 Hypothyroidism, unspecified: Secondary | ICD-10-CM | POA: Diagnosis not present

## 2021-11-25 DIAGNOSIS — J441 Chronic obstructive pulmonary disease with (acute) exacerbation: Secondary | ICD-10-CM | POA: Diagnosis not present

## 2021-11-25 DIAGNOSIS — Z87891 Personal history of nicotine dependence: Secondary | ICD-10-CM | POA: Diagnosis not present

## 2021-11-25 DIAGNOSIS — K219 Gastro-esophageal reflux disease without esophagitis: Secondary | ICD-10-CM | POA: Diagnosis not present

## 2021-11-25 DIAGNOSIS — Z79891 Long term (current) use of opiate analgesic: Secondary | ICD-10-CM | POA: Diagnosis not present

## 2021-11-25 DIAGNOSIS — Z9181 History of falling: Secondary | ICD-10-CM | POA: Diagnosis not present

## 2021-11-25 DIAGNOSIS — I110 Hypertensive heart disease with heart failure: Secondary | ICD-10-CM | POA: Diagnosis not present

## 2021-11-25 DIAGNOSIS — I5022 Chronic systolic (congestive) heart failure: Secondary | ICD-10-CM | POA: Diagnosis not present

## 2021-11-25 DIAGNOSIS — E119 Type 2 diabetes mellitus without complications: Secondary | ICD-10-CM | POA: Diagnosis not present

## 2021-11-25 DIAGNOSIS — Z7951 Long term (current) use of inhaled steroids: Secondary | ICD-10-CM | POA: Diagnosis not present

## 2021-11-25 DIAGNOSIS — J9602 Acute respiratory failure with hypercapnia: Secondary | ICD-10-CM | POA: Diagnosis not present

## 2021-11-25 DIAGNOSIS — R0689 Other abnormalities of breathing: Secondary | ICD-10-CM | POA: Diagnosis not present

## 2021-11-25 DIAGNOSIS — E0500 Thyrotoxicosis with diffuse goiter without thyrotoxic crisis or storm: Secondary | ICD-10-CM | POA: Diagnosis not present

## 2021-11-25 DIAGNOSIS — K589 Irritable bowel syndrome without diarrhea: Secondary | ICD-10-CM | POA: Diagnosis not present

## 2021-11-25 DIAGNOSIS — F32A Depression, unspecified: Secondary | ICD-10-CM | POA: Diagnosis not present

## 2021-11-25 DIAGNOSIS — Z9981 Dependence on supplemental oxygen: Secondary | ICD-10-CM | POA: Diagnosis not present

## 2021-11-25 DIAGNOSIS — J9601 Acute respiratory failure with hypoxia: Secondary | ICD-10-CM | POA: Diagnosis not present

## 2021-11-25 DIAGNOSIS — M797 Fibromyalgia: Secondary | ICD-10-CM | POA: Diagnosis not present

## 2021-11-25 DIAGNOSIS — Z79899 Other long term (current) drug therapy: Secondary | ICD-10-CM | POA: Diagnosis not present

## 2021-11-26 DIAGNOSIS — Z9181 History of falling: Secondary | ICD-10-CM | POA: Diagnosis not present

## 2021-11-26 DIAGNOSIS — Z7951 Long term (current) use of inhaled steroids: Secondary | ICD-10-CM | POA: Diagnosis not present

## 2021-11-26 DIAGNOSIS — E039 Hypothyroidism, unspecified: Secondary | ICD-10-CM | POA: Diagnosis not present

## 2021-11-26 DIAGNOSIS — E0500 Thyrotoxicosis with diffuse goiter without thyrotoxic crisis or storm: Secondary | ICD-10-CM | POA: Diagnosis not present

## 2021-11-26 DIAGNOSIS — E119 Type 2 diabetes mellitus without complications: Secondary | ICD-10-CM | POA: Diagnosis not present

## 2021-11-26 DIAGNOSIS — Z79899 Other long term (current) drug therapy: Secondary | ICD-10-CM | POA: Diagnosis not present

## 2021-11-26 DIAGNOSIS — K219 Gastro-esophageal reflux disease without esophagitis: Secondary | ICD-10-CM | POA: Diagnosis not present

## 2021-11-26 DIAGNOSIS — Z9981 Dependence on supplemental oxygen: Secondary | ICD-10-CM | POA: Diagnosis not present

## 2021-11-26 DIAGNOSIS — R0689 Other abnormalities of breathing: Secondary | ICD-10-CM | POA: Diagnosis not present

## 2021-11-26 DIAGNOSIS — J9601 Acute respiratory failure with hypoxia: Secondary | ICD-10-CM | POA: Diagnosis not present

## 2021-11-26 DIAGNOSIS — Z79891 Long term (current) use of opiate analgesic: Secondary | ICD-10-CM | POA: Diagnosis not present

## 2021-11-26 DIAGNOSIS — F32A Depression, unspecified: Secondary | ICD-10-CM | POA: Diagnosis not present

## 2021-11-26 DIAGNOSIS — I110 Hypertensive heart disease with heart failure: Secondary | ICD-10-CM | POA: Diagnosis not present

## 2021-11-26 DIAGNOSIS — M797 Fibromyalgia: Secondary | ICD-10-CM | POA: Diagnosis not present

## 2021-11-26 DIAGNOSIS — J9602 Acute respiratory failure with hypercapnia: Secondary | ICD-10-CM | POA: Diagnosis not present

## 2021-11-26 DIAGNOSIS — J441 Chronic obstructive pulmonary disease with (acute) exacerbation: Secondary | ICD-10-CM | POA: Diagnosis not present

## 2021-11-26 DIAGNOSIS — M4800 Spinal stenosis, site unspecified: Secondary | ICD-10-CM | POA: Diagnosis not present

## 2021-11-26 DIAGNOSIS — Z87891 Personal history of nicotine dependence: Secondary | ICD-10-CM | POA: Diagnosis not present

## 2021-11-26 DIAGNOSIS — K589 Irritable bowel syndrome without diarrhea: Secondary | ICD-10-CM | POA: Diagnosis not present

## 2021-11-26 DIAGNOSIS — R0602 Shortness of breath: Secondary | ICD-10-CM | POA: Diagnosis not present

## 2021-11-26 DIAGNOSIS — I5022 Chronic systolic (congestive) heart failure: Secondary | ICD-10-CM | POA: Diagnosis not present

## 2021-12-04 ENCOUNTER — Other Ambulatory Visit: Payer: Self-pay

## 2021-12-04 ENCOUNTER — Telehealth: Payer: Self-pay

## 2021-12-04 DIAGNOSIS — K589 Irritable bowel syndrome without diarrhea: Secondary | ICD-10-CM | POA: Diagnosis not present

## 2021-12-04 DIAGNOSIS — Z7951 Long term (current) use of inhaled steroids: Secondary | ICD-10-CM | POA: Diagnosis not present

## 2021-12-04 DIAGNOSIS — Z79899 Other long term (current) drug therapy: Secondary | ICD-10-CM | POA: Diagnosis not present

## 2021-12-04 DIAGNOSIS — Z9981 Dependence on supplemental oxygen: Secondary | ICD-10-CM | POA: Diagnosis not present

## 2021-12-04 DIAGNOSIS — Z87891 Personal history of nicotine dependence: Secondary | ICD-10-CM | POA: Diagnosis not present

## 2021-12-04 DIAGNOSIS — J441 Chronic obstructive pulmonary disease with (acute) exacerbation: Secondary | ICD-10-CM | POA: Diagnosis not present

## 2021-12-04 DIAGNOSIS — E0500 Thyrotoxicosis with diffuse goiter without thyrotoxic crisis or storm: Secondary | ICD-10-CM | POA: Diagnosis not present

## 2021-12-04 DIAGNOSIS — J9602 Acute respiratory failure with hypercapnia: Secondary | ICD-10-CM | POA: Diagnosis not present

## 2021-12-04 DIAGNOSIS — I5022 Chronic systolic (congestive) heart failure: Secondary | ICD-10-CM | POA: Diagnosis not present

## 2021-12-04 DIAGNOSIS — M797 Fibromyalgia: Secondary | ICD-10-CM | POA: Diagnosis not present

## 2021-12-04 DIAGNOSIS — R0689 Other abnormalities of breathing: Secondary | ICD-10-CM | POA: Diagnosis not present

## 2021-12-04 DIAGNOSIS — Z9181 History of falling: Secondary | ICD-10-CM | POA: Diagnosis not present

## 2021-12-04 DIAGNOSIS — Z79891 Long term (current) use of opiate analgesic: Secondary | ICD-10-CM | POA: Diagnosis not present

## 2021-12-04 DIAGNOSIS — I110 Hypertensive heart disease with heart failure: Secondary | ICD-10-CM | POA: Diagnosis not present

## 2021-12-04 DIAGNOSIS — E119 Type 2 diabetes mellitus without complications: Secondary | ICD-10-CM | POA: Diagnosis not present

## 2021-12-04 DIAGNOSIS — F32A Depression, unspecified: Secondary | ICD-10-CM | POA: Diagnosis not present

## 2021-12-04 DIAGNOSIS — K219 Gastro-esophageal reflux disease without esophagitis: Secondary | ICD-10-CM | POA: Diagnosis not present

## 2021-12-04 DIAGNOSIS — J9601 Acute respiratory failure with hypoxia: Secondary | ICD-10-CM | POA: Diagnosis not present

## 2021-12-04 DIAGNOSIS — E039 Hypothyroidism, unspecified: Secondary | ICD-10-CM | POA: Diagnosis not present

## 2021-12-04 DIAGNOSIS — M4800 Spinal stenosis, site unspecified: Secondary | ICD-10-CM | POA: Diagnosis not present

## 2021-12-04 MED ORDER — PREDNISONE 10 MG PO TABS: ORAL_TABLET | ORAL | 0 refills | Status: DC

## 2021-12-04 MED ORDER — AZITHROMYCIN 250 MG PO TABS: ORAL_TABLET | ORAL | 0 refills | Status: DC

## 2021-12-04 NOTE — Telephone Encounter (Signed)
Pt called that she that occupational therapy came today her temp was 99.3 and also she is having symptoms fever,coughing,sob and tickle in throat and covid test is negative as dr Welton Flakes send zpak and prednisone to [facility]

## 2021-12-05 ENCOUNTER — Telehealth: Payer: Self-pay

## 2021-12-05 NOTE — Telephone Encounter (Signed)
Gave verbal order stehanie from [facility] [phone] for occupational therapy for once a week for 6 week

## 2021-12-08 DIAGNOSIS — J449 Chronic obstructive pulmonary disease, unspecified: Secondary | ICD-10-CM | POA: Diagnosis not present

## 2021-12-11 DIAGNOSIS — Z79891 Long term (current) use of opiate analgesic: Secondary | ICD-10-CM | POA: Diagnosis not present

## 2021-12-11 DIAGNOSIS — J9601 Acute respiratory failure with hypoxia: Secondary | ICD-10-CM | POA: Diagnosis not present

## 2021-12-11 DIAGNOSIS — M797 Fibromyalgia: Secondary | ICD-10-CM | POA: Diagnosis not present

## 2021-12-11 DIAGNOSIS — J441 Chronic obstructive pulmonary disease with (acute) exacerbation: Secondary | ICD-10-CM | POA: Diagnosis not present

## 2021-12-11 DIAGNOSIS — R0689 Other abnormalities of breathing: Secondary | ICD-10-CM | POA: Diagnosis not present

## 2021-12-11 DIAGNOSIS — F32A Depression, unspecified: Secondary | ICD-10-CM | POA: Diagnosis not present

## 2021-12-11 DIAGNOSIS — Z9981 Dependence on supplemental oxygen: Secondary | ICD-10-CM | POA: Diagnosis not present

## 2021-12-11 DIAGNOSIS — E0500 Thyrotoxicosis with diffuse goiter without thyrotoxic crisis or storm: Secondary | ICD-10-CM | POA: Diagnosis not present

## 2021-12-11 DIAGNOSIS — Z9181 History of falling: Secondary | ICD-10-CM | POA: Diagnosis not present

## 2021-12-11 DIAGNOSIS — J9602 Acute respiratory failure with hypercapnia: Secondary | ICD-10-CM | POA: Diagnosis not present

## 2021-12-11 DIAGNOSIS — K219 Gastro-esophageal reflux disease without esophagitis: Secondary | ICD-10-CM | POA: Diagnosis not present

## 2021-12-11 DIAGNOSIS — I5022 Chronic systolic (congestive) heart failure: Secondary | ICD-10-CM | POA: Diagnosis not present

## 2021-12-11 DIAGNOSIS — E039 Hypothyroidism, unspecified: Secondary | ICD-10-CM | POA: Diagnosis not present

## 2021-12-11 DIAGNOSIS — Z79899 Other long term (current) drug therapy: Secondary | ICD-10-CM | POA: Diagnosis not present

## 2021-12-11 DIAGNOSIS — Z87891 Personal history of nicotine dependence: Secondary | ICD-10-CM | POA: Diagnosis not present

## 2021-12-11 DIAGNOSIS — K589 Irritable bowel syndrome without diarrhea: Secondary | ICD-10-CM | POA: Diagnosis not present

## 2021-12-11 DIAGNOSIS — I110 Hypertensive heart disease with heart failure: Secondary | ICD-10-CM | POA: Diagnosis not present

## 2021-12-11 DIAGNOSIS — M4800 Spinal stenosis, site unspecified: Secondary | ICD-10-CM | POA: Diagnosis not present

## 2021-12-11 DIAGNOSIS — Z7951 Long term (current) use of inhaled steroids: Secondary | ICD-10-CM | POA: Diagnosis not present

## 2021-12-11 DIAGNOSIS — E119 Type 2 diabetes mellitus without complications: Secondary | ICD-10-CM | POA: Diagnosis not present

## 2021-12-12 ENCOUNTER — Other Ambulatory Visit: Payer: Self-pay | Admitting: Nurse Practitioner

## 2021-12-12 DIAGNOSIS — F5101 Primary insomnia: Secondary | ICD-10-CM

## 2021-12-16 ENCOUNTER — Other Ambulatory Visit: Payer: Self-pay

## 2021-12-16 MED ORDER — MIRTAZAPINE 7.5 MG PO TABS: 7.5000 mg | ORAL_TABLET | Freq: Every day | ORAL | 1 refills | Status: DC

## 2021-12-17 ENCOUNTER — Telehealth: Payer: Self-pay

## 2021-12-17 NOTE — Telephone Encounter (Signed)
Arline Asp [facility] [phone] verbal orders for physical therapy for once a week for nine weeks, orders from hospital [facility].

## 2021-12-18 DIAGNOSIS — I5022 Chronic systolic (congestive) heart failure: Secondary | ICD-10-CM | POA: Diagnosis not present

## 2021-12-18 DIAGNOSIS — M4800 Spinal stenosis, site unspecified: Secondary | ICD-10-CM | POA: Diagnosis not present

## 2021-12-18 DIAGNOSIS — I110 Hypertensive heart disease with heart failure: Secondary | ICD-10-CM | POA: Diagnosis not present

## 2021-12-18 DIAGNOSIS — F32A Depression, unspecified: Secondary | ICD-10-CM | POA: Diagnosis not present

## 2021-12-18 DIAGNOSIS — Z79891 Long term (current) use of opiate analgesic: Secondary | ICD-10-CM | POA: Diagnosis not present

## 2021-12-18 DIAGNOSIS — Z7952 Long term (current) use of systemic steroids: Secondary | ICD-10-CM | POA: Diagnosis not present

## 2021-12-18 DIAGNOSIS — M797 Fibromyalgia: Secondary | ICD-10-CM | POA: Diagnosis not present

## 2021-12-18 DIAGNOSIS — K219 Gastro-esophageal reflux disease without esophagitis: Secondary | ICD-10-CM | POA: Diagnosis not present

## 2021-12-18 DIAGNOSIS — Z87891 Personal history of nicotine dependence: Secondary | ICD-10-CM | POA: Diagnosis not present

## 2021-12-18 DIAGNOSIS — Z79899 Other long term (current) drug therapy: Secondary | ICD-10-CM | POA: Diagnosis not present

## 2021-12-18 DIAGNOSIS — K589 Irritable bowel syndrome without diarrhea: Secondary | ICD-10-CM | POA: Diagnosis not present

## 2021-12-18 DIAGNOSIS — Z9181 History of falling: Secondary | ICD-10-CM | POA: Diagnosis not present

## 2021-12-18 DIAGNOSIS — Z7951 Long term (current) use of inhaled steroids: Secondary | ICD-10-CM | POA: Diagnosis not present

## 2021-12-18 DIAGNOSIS — J449 Chronic obstructive pulmonary disease, unspecified: Secondary | ICD-10-CM | POA: Diagnosis not present

## 2021-12-18 DIAGNOSIS — E039 Hypothyroidism, unspecified: Secondary | ICD-10-CM | POA: Diagnosis not present

## 2021-12-18 DIAGNOSIS — E119 Type 2 diabetes mellitus without complications: Secondary | ICD-10-CM | POA: Diagnosis not present

## 2021-12-18 DIAGNOSIS — Z9981 Dependence on supplemental oxygen: Secondary | ICD-10-CM | POA: Diagnosis not present

## 2021-12-18 DIAGNOSIS — E0500 Thyrotoxicosis with diffuse goiter without thyrotoxic crisis or storm: Secondary | ICD-10-CM | POA: Diagnosis not present

## 2021-12-19 ENCOUNTER — Encounter: Payer: Self-pay | Admitting: Nurse Practitioner

## 2021-12-19 DIAGNOSIS — M4800 Spinal stenosis, site unspecified: Secondary | ICD-10-CM | POA: Diagnosis not present

## 2021-12-19 DIAGNOSIS — Z79891 Long term (current) use of opiate analgesic: Secondary | ICD-10-CM | POA: Diagnosis not present

## 2021-12-19 DIAGNOSIS — Z9981 Dependence on supplemental oxygen: Secondary | ICD-10-CM | POA: Diagnosis not present

## 2021-12-19 DIAGNOSIS — I5022 Chronic systolic (congestive) heart failure: Secondary | ICD-10-CM | POA: Diagnosis not present

## 2021-12-19 DIAGNOSIS — Z87891 Personal history of nicotine dependence: Secondary | ICD-10-CM | POA: Diagnosis not present

## 2021-12-19 DIAGNOSIS — Z79899 Other long term (current) drug therapy: Secondary | ICD-10-CM | POA: Diagnosis not present

## 2021-12-19 DIAGNOSIS — K219 Gastro-esophageal reflux disease without esophagitis: Secondary | ICD-10-CM | POA: Diagnosis not present

## 2021-12-19 DIAGNOSIS — Z9181 History of falling: Secondary | ICD-10-CM | POA: Diagnosis not present

## 2021-12-19 DIAGNOSIS — F32A Depression, unspecified: Secondary | ICD-10-CM | POA: Diagnosis not present

## 2021-12-19 DIAGNOSIS — K589 Irritable bowel syndrome without diarrhea: Secondary | ICD-10-CM | POA: Diagnosis not present

## 2021-12-19 DIAGNOSIS — E119 Type 2 diabetes mellitus without complications: Secondary | ICD-10-CM | POA: Diagnosis not present

## 2021-12-19 DIAGNOSIS — E039 Hypothyroidism, unspecified: Secondary | ICD-10-CM | POA: Diagnosis not present

## 2021-12-19 DIAGNOSIS — I110 Hypertensive heart disease with heart failure: Secondary | ICD-10-CM | POA: Diagnosis not present

## 2021-12-19 DIAGNOSIS — Z7952 Long term (current) use of systemic steroids: Secondary | ICD-10-CM | POA: Diagnosis not present

## 2021-12-19 DIAGNOSIS — Z7951 Long term (current) use of inhaled steroids: Secondary | ICD-10-CM | POA: Diagnosis not present

## 2021-12-19 DIAGNOSIS — J449 Chronic obstructive pulmonary disease, unspecified: Secondary | ICD-10-CM | POA: Diagnosis not present

## 2021-12-19 DIAGNOSIS — E0500 Thyrotoxicosis with diffuse goiter without thyrotoxic crisis or storm: Secondary | ICD-10-CM | POA: Diagnosis not present

## 2021-12-19 DIAGNOSIS — M797 Fibromyalgia: Secondary | ICD-10-CM | POA: Diagnosis not present

## 2021-12-23 DIAGNOSIS — F32A Depression, unspecified: Secondary | ICD-10-CM | POA: Diagnosis not present

## 2021-12-23 DIAGNOSIS — M4800 Spinal stenosis, site unspecified: Secondary | ICD-10-CM | POA: Diagnosis not present

## 2021-12-23 DIAGNOSIS — K219 Gastro-esophageal reflux disease without esophagitis: Secondary | ICD-10-CM | POA: Diagnosis not present

## 2021-12-23 DIAGNOSIS — Z79899 Other long term (current) drug therapy: Secondary | ICD-10-CM | POA: Diagnosis not present

## 2021-12-23 DIAGNOSIS — J449 Chronic obstructive pulmonary disease, unspecified: Secondary | ICD-10-CM | POA: Diagnosis not present

## 2021-12-23 DIAGNOSIS — E119 Type 2 diabetes mellitus without complications: Secondary | ICD-10-CM | POA: Diagnosis not present

## 2021-12-23 DIAGNOSIS — Z79891 Long term (current) use of opiate analgesic: Secondary | ICD-10-CM | POA: Diagnosis not present

## 2021-12-23 DIAGNOSIS — Z9181 History of falling: Secondary | ICD-10-CM | POA: Diagnosis not present

## 2021-12-23 DIAGNOSIS — K589 Irritable bowel syndrome without diarrhea: Secondary | ICD-10-CM | POA: Diagnosis not present

## 2021-12-23 DIAGNOSIS — Z7951 Long term (current) use of inhaled steroids: Secondary | ICD-10-CM | POA: Diagnosis not present

## 2021-12-23 DIAGNOSIS — I5022 Chronic systolic (congestive) heart failure: Secondary | ICD-10-CM | POA: Diagnosis not present

## 2021-12-23 DIAGNOSIS — E0500 Thyrotoxicosis with diffuse goiter without thyrotoxic crisis or storm: Secondary | ICD-10-CM | POA: Diagnosis not present

## 2021-12-23 DIAGNOSIS — Z7952 Long term (current) use of systemic steroids: Secondary | ICD-10-CM | POA: Diagnosis not present

## 2021-12-23 DIAGNOSIS — E039 Hypothyroidism, unspecified: Secondary | ICD-10-CM | POA: Diagnosis not present

## 2021-12-23 DIAGNOSIS — Z9981 Dependence on supplemental oxygen: Secondary | ICD-10-CM | POA: Diagnosis not present

## 2021-12-23 DIAGNOSIS — M797 Fibromyalgia: Secondary | ICD-10-CM | POA: Diagnosis not present

## 2021-12-23 DIAGNOSIS — Z87891 Personal history of nicotine dependence: Secondary | ICD-10-CM | POA: Diagnosis not present

## 2021-12-23 DIAGNOSIS — I110 Hypertensive heart disease with heart failure: Secondary | ICD-10-CM | POA: Diagnosis not present

## 2021-12-27 DIAGNOSIS — R0602 Shortness of breath: Secondary | ICD-10-CM | POA: Diagnosis not present

## 2021-12-30 DIAGNOSIS — Z79891 Long term (current) use of opiate analgesic: Secondary | ICD-10-CM | POA: Diagnosis not present

## 2021-12-30 DIAGNOSIS — J449 Chronic obstructive pulmonary disease, unspecified: Secondary | ICD-10-CM | POA: Diagnosis not present

## 2021-12-30 DIAGNOSIS — K589 Irritable bowel syndrome without diarrhea: Secondary | ICD-10-CM | POA: Diagnosis not present

## 2021-12-30 DIAGNOSIS — Z7952 Long term (current) use of systemic steroids: Secondary | ICD-10-CM | POA: Diagnosis not present

## 2021-12-30 DIAGNOSIS — M4800 Spinal stenosis, site unspecified: Secondary | ICD-10-CM | POA: Diagnosis not present

## 2021-12-30 DIAGNOSIS — E119 Type 2 diabetes mellitus without complications: Secondary | ICD-10-CM | POA: Diagnosis not present

## 2021-12-30 DIAGNOSIS — K219 Gastro-esophageal reflux disease without esophagitis: Secondary | ICD-10-CM | POA: Diagnosis not present

## 2021-12-30 DIAGNOSIS — I5022 Chronic systolic (congestive) heart failure: Secondary | ICD-10-CM | POA: Diagnosis not present

## 2021-12-30 DIAGNOSIS — M797 Fibromyalgia: Secondary | ICD-10-CM | POA: Diagnosis not present

## 2021-12-30 DIAGNOSIS — F32A Depression, unspecified: Secondary | ICD-10-CM | POA: Diagnosis not present

## 2021-12-30 DIAGNOSIS — E0500 Thyrotoxicosis with diffuse goiter without thyrotoxic crisis or storm: Secondary | ICD-10-CM | POA: Diagnosis not present

## 2021-12-30 DIAGNOSIS — Z9181 History of falling: Secondary | ICD-10-CM | POA: Diagnosis not present

## 2021-12-30 DIAGNOSIS — Z7951 Long term (current) use of inhaled steroids: Secondary | ICD-10-CM | POA: Diagnosis not present

## 2021-12-30 DIAGNOSIS — Z79899 Other long term (current) drug therapy: Secondary | ICD-10-CM | POA: Diagnosis not present

## 2021-12-30 DIAGNOSIS — I110 Hypertensive heart disease with heart failure: Secondary | ICD-10-CM | POA: Diagnosis not present

## 2021-12-30 DIAGNOSIS — Z87891 Personal history of nicotine dependence: Secondary | ICD-10-CM | POA: Diagnosis not present

## 2021-12-30 DIAGNOSIS — Z9981 Dependence on supplemental oxygen: Secondary | ICD-10-CM | POA: Diagnosis not present

## 2021-12-30 DIAGNOSIS — E039 Hypothyroidism, unspecified: Secondary | ICD-10-CM | POA: Diagnosis not present

## 2021-12-31 ENCOUNTER — Other Ambulatory Visit: Payer: Self-pay | Admitting: *Deleted

## 2021-12-31 NOTE — Patient Outreach (Signed)
  Care Coordination   Initial Visit Note   12/31/2021 Name: JOZELYN KUWAHARA MRN: [id number] DOB: 01-09-1946  ANNABEL GIBEAU is a 76 y.o. year old female who sees Sallyanne Kuster, NP for primary care. I spoke with  Marja Kays by phone today  What matters to the patients health and wellness today?  Managing my COPD   Goals Addressed             This Visit's Progress    Development Plan of care for management of COPD          SDOH assessments and interventions completed:  Yes     Care Coordination Interventions Activated:  Yes  Care Coordination Interventions:  Yes, provided   Follow up plan: Follow up call scheduled for January 18 2022 with George Ina    Encounter Outcome:  Pt. Visit Completed  Gean Maidens BSN RN [facility] Care Management [phone]

## 2022-01-01 DIAGNOSIS — J449 Chronic obstructive pulmonary disease, unspecified: Secondary | ICD-10-CM | POA: Diagnosis not present

## 2022-01-05 ENCOUNTER — Ambulatory Visit (INDEPENDENT_AMBULATORY_CARE_PROVIDER_SITE_OTHER): Payer: Medicare Other | Admitting: Nurse Practitioner

## 2022-01-05 ENCOUNTER — Encounter: Payer: Self-pay | Admitting: Nurse Practitioner

## 2022-01-05 VITALS — BP 124/64 | HR 117 | Temp 97.8°F | Resp 16 | Ht 66.0 in | Wt 125.2 lb

## 2022-01-05 DIAGNOSIS — I5032 Chronic diastolic (congestive) heart failure: Secondary | ICD-10-CM

## 2022-01-05 DIAGNOSIS — E039 Hypothyroidism, unspecified: Secondary | ICD-10-CM

## 2022-01-05 DIAGNOSIS — F419 Anxiety disorder, unspecified: Secondary | ICD-10-CM

## 2022-01-05 DIAGNOSIS — F5101 Primary insomnia: Secondary | ICD-10-CM

## 2022-01-05 DIAGNOSIS — K219 Gastro-esophageal reflux disease without esophagitis: Secondary | ICD-10-CM

## 2022-01-05 DIAGNOSIS — R0902 Hypoxemia: Secondary | ICD-10-CM | POA: Diagnosis not present

## 2022-01-05 DIAGNOSIS — I700 Atherosclerosis of aorta: Secondary | ICD-10-CM

## 2022-01-05 DIAGNOSIS — Z760 Encounter for issue of repeat prescription: Secondary | ICD-10-CM

## 2022-01-05 DIAGNOSIS — J9611 Chronic respiratory failure with hypoxia: Secondary | ICD-10-CM

## 2022-01-05 DIAGNOSIS — J449 Chronic obstructive pulmonary disease, unspecified: Secondary | ICD-10-CM

## 2022-01-05 DIAGNOSIS — M159 Polyosteoarthritis, unspecified: Secondary | ICD-10-CM

## 2022-01-05 MED ORDER — MIRTAZAPINE 7.5 MG PO TABS: 7.5000 mg | ORAL_TABLET | Freq: Every day | ORAL | 1 refills | Status: AC

## 2022-01-05 MED ORDER — LEVOTHYROXINE SODIUM 88 MCG PO TABS: 88.0000 ug | ORAL_TABLET | Freq: Every day | ORAL | 0 refills | Status: AC

## 2022-01-05 MED ORDER — TRAMADOL HCL 50 MG PO TABS: 50.0000 mg | ORAL_TABLET | Freq: Three times a day (TID) | ORAL | 2 refills | Status: AC | PRN

## 2022-01-05 MED ORDER — LORAZEPAM 1 MG PO TABS: 1.0000 mg | ORAL_TABLET | Freq: Two times a day (BID) | ORAL | 2 refills | Status: DC | PRN

## 2022-01-05 MED ORDER — SPIRONOLACTONE 25 MG PO TABS: 25.0000 mg | ORAL_TABLET | Freq: Every day | ORAL | 1 refills | Status: DC

## 2022-01-05 MED ORDER — FLUOXETINE HCL 40 MG PO CAPS: 40.0000 mg | ORAL_CAPSULE | Freq: Every day | ORAL | 3 refills | Status: AC

## 2022-01-05 MED ORDER — ERGOCALCIFEROL 50 MCG (2000 UT) PO TABS: 2.0000 | ORAL_TABLET | Freq: Every day | ORAL | 3 refills | Status: AC

## 2022-01-05 MED ORDER — ALBUTEROL SULFATE HFA 108 (90 BASE) MCG/ACT IN AERS: INHALATION_SPRAY | RESPIRATORY_TRACT | 3 refills | Status: AC

## 2022-01-05 MED ORDER — ROSUVASTATIN CALCIUM 20 MG PO TABS: 20.0000 mg | ORAL_TABLET | Freq: Every day | ORAL | 3 refills | Status: AC

## 2022-01-05 MED ORDER — FUROSEMIDE 40 MG PO TABS: 40.0000 mg | ORAL_TABLET | Freq: Every day | ORAL | 3 refills | Status: AC

## 2022-01-05 MED ORDER — HYDROCORTISONE 2.5 % EX CREA
1.0000 | TOPICAL_CREAM | Freq: Two times a day (BID) | CUTANEOUS | 5 refills | Status: AC | PRN
Start: 2022-01-05 — End: ?

## 2022-01-05 MED ORDER — LUBIPROSTONE 8 MCG PO CAPS: 8.0000 ug | ORAL_CAPSULE | Freq: Every day | ORAL | 3 refills | Status: AC

## 2022-01-05 MED ORDER — SYMBICORT 160-4.5 MCG/ACT IN AERO: 2.0000 | INHALATION_SPRAY | Freq: Two times a day (BID) | RESPIRATORY_TRACT | 5 refills | Status: AC

## 2022-01-05 MED ORDER — FLUTICASONE PROPIONATE 50 MCG/ACT NA SUSP: 2.0000 | Freq: Two times a day (BID) | NASAL | 5 refills | Status: AC

## 2022-01-05 MED ORDER — PANTOPRAZOLE SODIUM 40 MG PO TBEC: 40.0000 mg | DELAYED_RELEASE_TABLET | Freq: Two times a day (BID) | ORAL | 3 refills | Status: AC

## 2022-01-05 NOTE — Progress Notes (Signed)
[facility] [place], [place] [address]  Internal MEDICINE  Office Visit Note  Patient Name: [name]  [id number]  [id number]  Date of Service: 01/05/2022  Chief Complaint  Patient presents with   Follow-up   Depression   Diabetes   Hypertension   Cough    Intermittent cough for 2 weeks   Quality Metric Gaps    Eye Exam   Medication Refill    HPI Marleyna presents for a follow up visit for diabetes, hypertension, medication refills, dry cough.  She is switching [facility] to [facility] [place] for pharmacy and needs all her medication prescriptions sent to [facility] today.  Need to repeat thyroid labs to evaluate current dose of levothyroxine.  Has an eye exam scheduled at [place] next month.  Has an intermittent dry cough for about 2 weeks. Not very bothersome and no other associated symptoms present.  Currently back in physical therapy .     Current Medication: Outpatient Encounter Medications as of 01/05/2022  Medication Sig   acetaminophen (TYLENOL) 500 MG tablet Take 1,000 mg by mouth in the morning, at noon, and at bedtime.   diphenhydrAMINE (BENADRYL) 25 MG tablet Take 50 mg by mouth 2 (two) times daily.   OXYGEN Inhale into the lungs. Pt is on 2 liters oxygen 24 hrs uses [facility]   tiotropium (SPIRIVA HANDIHALER) 18 MCG inhalation capsule Place 1 capsule (18 mcg total) into inhaler and inhale daily.   [DISCONTINUED] albuterol (VENTOLIN HFA) 108 (90 Base) MCG/ACT inhaler INAHLE 2 PUFFS INTO THE LUNGS EVERY 6 HOURS AS NEEDED FOR WHEEZING OR SHORTNESS OF BREATH   [DISCONTINUED] azithromycin (ZITHROMAX) 250 MG tablet Use  as directed for 5 days   [DISCONTINUED] Ergocalciferol 50 MCG (2000 UT) TABS Take 2 capsules by mouth daily.   [DISCONTINUED] FLUoxetine (PROZAC) 40 MG capsule Take 1 capsule (40 mg total) by mouth daily as needed.   [DISCONTINUED] fluticasone (FLONASE) 50 MCG/ACT nasal spray Place 2 sprays into both nostrils  in the morning and at bedtime.   [DISCONTINUED] furosemide (LASIX) 40 MG tablet Take 1 tablet (40 mg total) by mouth daily.   [DISCONTINUED] hydrocortisone 2.5 % cream Place 1 application rectally 2 (two) times daily as needed (itching/hemorroids).    [DISCONTINUED] levothyroxine (SYNTHROID) 88 MCG tablet Take 1 tablet (88 mcg total) by mouth daily before breakfast. Take 30 minutes before food and with a full glass of water   [DISCONTINUED] LORazepam (ATIVAN) 1 MG tablet Take 1 tablet (1 mg total) by mouth 2 (two) times daily as needed for anxiety.   [DISCONTINUED] lubiprostone (AMITIZA) 8 MCG capsule Take 1 capsule (8 mcg total) by mouth daily with breakfast.   [DISCONTINUED] mirtazapine (REMERON) 7.5 MG tablet Take 1 tablet (7.5 mg total) by mouth at bedtime.   [DISCONTINUED] pantoprazole (PROTONIX) 40 MG tablet Take 1 tablet (40 mg total) by mouth 2 (two) times daily.   [DISCONTINUED] predniSONE (DELTASONE) 10 MG tablet Take 1 tab po 3 x day for 3 days then take 1 tab po 2 x a day for 3 days and then take 1 tab po daily for 3 days   [DISCONTINUED] rosuvastatin (CRESTOR) 20 MG tablet Take 1 tablet (20 mg total) by mouth daily.   [DISCONTINUED] spironolactone (ALDACTONE) 25 MG tablet Take 1 tablet (25 mg total) by mouth daily.   [DISCONTINUED] SYMBICORT 160-4.5 MCG/ACT inhaler Inhale 2 puffs into the lungs 2 (two) times daily.   [DISCONTINUED] traMADol (ULTRAM) 50  MG tablet Take 1 tablet (50 mg total) by mouth 2 (two) times daily as needed for moderate pain or severe pain.   albuterol (VENTOLIN HFA) 108 (90 Base) MCG/ACT inhaler INAHLE 2 PUFFS INTO THE LUNGS EVERY 6 HOURS AS NEEDED FOR WHEEZING OR SHORTNESS OF BREATH   Ergocalciferol 50 MCG (2000 UT) TABS Take 2 capsules by mouth daily.   FLUoxetine (PROZAC) 40 MG capsule Take 1 capsule (40 mg total) by mouth daily.   fluticasone (FLONASE) 50 MCG/ACT nasal spray Place 2 sprays into both nostrils in the morning and at bedtime.   furosemide (LASIX)  40 MG tablet Take 1 tablet (40 mg total) by mouth daily.   hydrocortisone 2.5 % cream Apply 1 Application topically 2 (two) times daily as needed (itching/hemorroids).   levothyroxine (SYNTHROID) 88 MCG tablet Take 1 tablet (88 mcg total) by mouth daily before breakfast. Take 30 minutes before food and with a full glass of water   LORazepam (ATIVAN) 1 MG tablet Take 1 tablet (1 mg total) by mouth 2 (two) times daily as needed for anxiety.   lubiprostone (AMITIZA) 8 MCG capsule Take 1 capsule (8 mcg total) by mouth daily with breakfast.   mirtazapine (REMERON) 7.5 MG tablet Take 1 tablet (7.5 mg total) by mouth at bedtime.   pantoprazole (PROTONIX) 40 MG tablet Take 1 tablet (40 mg total) by mouth 2 (two) times daily.   rosuvastatin (CRESTOR) 20 MG tablet Take 1 tablet (20 mg total) by mouth daily.   spironolactone (ALDACTONE) 25 MG tablet Take 1 tablet (25 mg total) by mouth daily.   SYMBICORT 160-4.5 MCG/ACT inhaler Inhale 2 puffs into the lungs 2 (two) times daily.   traMADol (ULTRAM) 50 MG tablet Take 1 tablet (50 mg total) by mouth 3 (three) times daily as needed for moderate pain or severe pain.   [DISCONTINUED] enoxaparin (LOVENOX) 40 MG/0.4ML injection Inject 0.4 mLs (40 mg total) into the skin daily. (Patient not taking: Reported on 12/14/2020)   No facility-administered encounter medications on file as of 01/05/2022.    Surgical History: Past Surgical History:  Procedure Laterality Date   ABDOMINAL HYSTERECTOMY     CAST APPLICATION  11/19/2020   Procedure: Application of long-leg hinged brace right lower extremity.;  Surgeon: Christena Flake, MD;  Location: [facility] ORS;  Service: Orthopedics;;   ESOPHAGOGASTRODUODENOSCOPY (EGD) WITH PROPOFOL N/A 01/10/2015   Procedure: ESOPHAGOGASTRODUODENOSCOPY (EGD) WITH PROPOFOL;  Surgeon: Wallace Cullens, MD;  Location: [facility] ENDOSCOPY;  Service: Gastroenterology;  Laterality: N/A;   ORIF ELBOW FRACTURE Left 11/19/2020   Procedure: OPEN REDUCTION INTERNAL FIXATION  (ORIF) ELBOW/OLECRANON FRACTURE;  Surgeon: Christena Flake, MD;  Location: [facility] ORS;  Service: Orthopedics;  Laterality: Left;   RECTAL SURGERY     RIGHT/LEFT HEART CATH AND CORONARY ANGIOGRAPHY Bilateral 04/08/2020   Procedure: RIGHT/LEFT HEART CATH AND CORONARY ANGIOGRAPHY;  Surgeon: Iran Ouch, MD;  Location: [facility] INVASIVE CV LAB;  Service: Cardiovascular;  Laterality: Bilateral;   SAVORY DILATION N/A 01/10/2015   Procedure: SAVORY DILATION;  Surgeon: Wallace Cullens, MD;  Location: [facility] ENDOSCOPY;  Service: Gastroenterology;  Laterality: N/A;   toenail removal Bilateral    great toes   TONSILLECTOMY AND ADENOIDECTOMY      Medical History: Past Medical History:  Diagnosis Date   Anxiety    Chest pain    CHF (congestive heart failure) (HCC)    COPD (chronic obstructive pulmonary disease) (HCC)    Depression    Diabetes mellitus without complication (HCC)  Fibromyalgia    Graves disease    Hypertension    IBS (irritable bowel syndrome)    PTSD (post-traumatic stress disorder)    Sinus problem    Spinal stenosis     Family History: Family History  Problem Relation Age of Onset   Alcohol abuse Mother    Depression Mother    Varicose Veins Mother    Alcohol abuse Father    Depression Father    Alcohol abuse Sister    Asthma Sister    COPD Sister    Depression Sister    Heart disease Sister    Hypertension Sister    Diabetes Sister    Depression Maternal Grandmother    Diabetes Paternal Grandmother    Stroke Paternal Grandmother     Social History   Socioeconomic History   Marital status: Widowed    Spouse name: Not on file   Number of children: 2   Years of education: college   Highest education level: Not on file  Occupational History   Not on file  Tobacco Use   Smoking status: Former    Packs/day: 0.50    Years: 25.00    Total pack years: 12.50    Types: Cigarettes    Quit date: 06/03/2016    Years since quitting: 5.5   Smokeless tobacco: Never   Vaping Use   Vaping Use: Never used  Substance and Sexual Activity   Alcohol use: No    Alcohol/week: 0.0 standard drinks of alcohol   Drug use: No   Sexual activity: Not Currently  Other Topics Concern   Not on file  Social History Narrative   Lives by herself; 1 daughter Marcelino Duster in [place]: 1 daughter in [place]    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   Social Determinants of Health   Financial Resource Strain: Low Risk  (09/20/2019)   Overall Financial Resource Strain (CARDIA)    Difficulty of Paying Living Expenses: Not hard at all  Food Insecurity: No Food Insecurity (12/27/2020)   Hunger Vital Sign    Worried About Running Out of Food in the Last Year: Never true    Ran Out of Food in the Last Year: Never true  Transportation Needs: No Transportation Needs (12/27/2020)   PRAPARE - Administrator, Civil Service (Medical): No    Lack of Transportation (Non-Medical): No  Physical Activity: Not on file  Stress: No Stress Concern Present (12/27/2020)   [employer] of Occupational Health - Occupational Stress Questionnaire    Feeling of Stress : Only a little  Social Connections: Not on file  Intimate Partner Violence: Not At Risk (12/27/2020)   Humiliation, Afraid, Rape, and Kick questionnaire    Fear of Current or Ex-Partner: No    Emotionally Abused: No    Physically Abused: No    Sexually Abused: No      Review of Systems  Constitutional:  Negative for chills, fatigue and unexpected weight change.  HENT:  Negative for congestion, rhinorrhea, sneezing and sore throat.   Eyes:  Negative for redness.  Respiratory:  Positive for cough. Negative for chest tightness, shortness of breath and wheezing.   Cardiovascular:  Negative for chest pain and palpitations.  Gastrointestinal:  Negative for abdominal pain, constipation, diarrhea, nausea and vomiting.  Genitourinary:  Negative for dysuria and frequency.  Musculoskeletal:  Positive for  gait problem (in physical therapy). Negative for arthralgias, back pain, joint swelling and neck pain.  Skin:  Negative for  rash.  Neurological:  Negative for tremors and numbness.  Hematological:  Negative for adenopathy. Does not bruise/bleed easily.  Psychiatric/Behavioral:  Negative for behavioral problems (Depression), sleep disturbance and suicidal ideas. The patient is not nervous/anxious.     Vital Signs: BP 124/64   Pulse (!) 117   Temp 97.8 F (36.6 C)   Resp 16   Ht 5\' 6"  (1.676 m)   Wt 125 lb 3.2 oz (56.8 kg)   SpO2 90%   BMI 20.21 kg/m    Physical Exam Vitals reviewed.  Constitutional:      General: She is not in acute distress.    Appearance: Normal appearance. She is normal weight. She is not ill-appearing.  HENT:     Head: Normocephalic and atraumatic.  Eyes:     Pupils: Pupils are equal, round, and reactive to light.  Cardiovascular:     Rate and Rhythm: Normal rate and regular rhythm.  Pulmonary:     Effort: Pulmonary effort is normal. No respiratory distress.  Neurological:     Mental Status: She is alert and oriented to person, place, and time.  Psychiatric:        Mood and Affect: Mood normal.        Behavior: Behavior normal.        Assessment/Plan: 1. Primary osteoarthritis involving multiple joints Going to physical therapy sessions which are helping some.   2. COPD with hypoxia (HCC) Dry cough, may be related to her COPD, no other significant symptoms, continue to monitor. Patient will call clinic if symptoms worsen or she develops additional symptoms.  3. Hypothyroidism, acquired Repeat thyroid labs, will adjust levothyroxine dose if necessary - TSH + free T4  4. Medication refill - traMADol (ULTRAM) 50 MG tablet; Take 1 tablet (50 mg total) by mouth 3 (three) times daily as needed for moderate pain or severe pain. (Patient not taking: Reported on 03/05/2022)  Dispense: 90 tablet; Refill: 2 - lubiprostone (AMITIZA) 8 MCG capsule; Take  1 capsule (8 mcg total) by mouth daily with breakfast.  Dispense: 90 capsule; Refill: 3 - mirtazapine (REMERON) 7.5 MG tablet; Take 1 tablet (7.5 mg total) by mouth at bedtime. (Patient not taking: Reported on 03/05/2022)  Dispense: 90 tablet; Refill: 1 - spironolactone (ALDACTONE) 25 MG tablet; Take 1 tablet (25 mg total) by mouth daily.  Dispense: 90 tablet; Refill: 1 - albuterol (VENTOLIN HFA) 108 (90 Base) MCG/ACT inhaler; INAHLE 2 PUFFS INTO THE LUNGS EVERY 6 HOURS AS NEEDED FOR WHEEZING OR SHORTNESS OF BREATH  Dispense: 8 g; Refill: 3 - Ergocalciferol 50 MCG (2000 UT) TABS; Take 2 capsules by mouth daily.  Dispense: 180 tablet; Refill: 3 - FLUoxetine (PROZAC) 40 MG capsule; Take 1 capsule (40 mg total) by mouth daily.  Dispense: 90 capsule; Refill: 3 - fluticasone (FLONASE) 50 MCG/ACT nasal spray; Place 2 sprays into both nostrils in the morning and at bedtime.  Dispense: 16 g; Refill: 5 - furosemide (LASIX) 40 MG tablet; Take 1 tablet (40 mg total) by mouth daily.  Dispense: 90 tablet; Refill: 3 - hydrocortisone 2.5 % cream; Apply 1 Application topically 2 (two) times daily as needed (itching/hemorroids).  Dispense: 28 g; Refill: 5 - levothyroxine (SYNTHROID) 88 MCG tablet; Take 1 tablet (88 mcg total) by mouth daily before breakfast. Take 30 minutes before food and with a full glass of water  Dispense: 90 tablet; Refill: 0 - SYMBICORT 160-4.5 MCG/ACT inhaler; Inhale 2 puffs into the lungs 2 (two) times daily.  Dispense: 10.2 each;  Refill: 5 - rosuvastatin (CRESTOR) 20 MG tablet; Take 1 tablet (20 mg total) by mouth daily.  Dispense: 90 tablet; Refill: 3 - pantoprazole (PROTONIX) 40 MG tablet; Take 1 tablet (40 mg total) by mouth 2 (two) times daily.  Dispense: 180 tablet; Refill: 3 - LORazepam (ATIVAN) 1 MG tablet; Take 1 tablet (1 mg total) by mouth 2 (two) times daily as needed for anxiety.  Dispense: 60 tablet; Refill: 2   General Counseling: Mariyanna verbalizes understanding of the findings  of todays visit and agrees with plan of treatment. I have discussed any further diagnostic evaluation that may be needed or ordered today. We also reviewed her medications today. she has been encouraged to call the office with any questions or concerns that should arise related to todays visit.    Orders Placed This Encounter  Procedures   TSH + free T4    Meds ordered this encounter  Medications   traMADol (ULTRAM) 50 MG tablet    Sig: Take 1 tablet (50 mg total) by mouth 3 (three) times daily as needed for moderate pain or severe pain.    Dispense:  90 tablet    Refill:  2   lubiprostone (AMITIZA) 8 MCG capsule    Sig: Take 1 capsule (8 mcg total) by mouth daily with breakfast.    Dispense:  90 capsule    Refill:  3   mirtazapine (REMERON) 7.5 MG tablet    Sig: Take 1 tablet (7.5 mg total) by mouth at bedtime.    Dispense:  90 tablet    Refill:  1   LORazepam (ATIVAN) 1 MG tablet    Sig: Take 1 tablet (1 mg total) by mouth 2 (two) times daily as needed for anxiety.    Dispense:  60 tablet    Refill:  2    Note change in frequency, discontinue lorazepam daily at bedtime   spironolactone (ALDACTONE) 25 MG tablet    Sig: Take 1 tablet (25 mg total) by mouth daily.    Dispense:  90 tablet    Refill:  1   albuterol (VENTOLIN HFA) 108 (90 Base) MCG/ACT inhaler    Sig: INAHLE 2 PUFFS INTO THE LUNGS EVERY 6 HOURS AS NEEDED FOR WHEEZING OR SHORTNESS OF BREATH    Dispense:  8 g    Refill:  3   Ergocalciferol 50 MCG (2000 UT) TABS    Sig: Take 2 capsules by mouth daily.    Dispense:  180 tablet    Refill:  3   FLUoxetine (PROZAC) 40 MG capsule    Sig: Take 1 capsule (40 mg total) by mouth daily.    Dispense:  90 capsule    Refill:  3   fluticasone (FLONASE) 50 MCG/ACT nasal spray    Sig: Place 2 sprays into both nostrils in the morning and at bedtime.    Dispense:  16 g    Refill:  5   furosemide (LASIX) 40 MG tablet    Sig: Take 1 tablet (40 mg total) by mouth daily.     Dispense:  90 tablet    Refill:  3   hydrocortisone 2.5 % cream    Sig: Apply 1 Application topically 2 (two) times daily as needed (itching/hemorroids).    Dispense:  28 g    Refill:  5   levothyroxine (SYNTHROID) 88 MCG tablet    Sig: Take 1 tablet (88 mcg total) by mouth daily before breakfast. Take 30 minutes before food and with a  full glass of water    Dispense:  90 tablet    Refill:  0    Do not fill until patient requests   SYMBICORT 160-4.5 MCG/ACT inhaler    Sig: Inhale 2 puffs into the lungs 2 (two) times daily.    Dispense:  10.2 each    Refill:  5   rosuvastatin (CRESTOR) 20 MG tablet    Sig: Take 1 tablet (20 mg total) by mouth daily.    Dispense:  90 tablet    Refill:  3   pantoprazole (PROTONIX) 40 MG tablet    Sig: Take 1 tablet (40 mg total) by mouth 2 (two) times daily.    Dispense:  180 tablet    Refill:  3    Return in about 3 months (around 04/07/2022) for F/U, med refill, Kalynne Womac PCP.   Total time spent:30 Minutes Time spent includes review of chart, medications, test results, and follow up plan with the patient.   [place] Controlled Substance Database was reviewed by me.  This patient was seen by Jonetta Osgood, FNP-C in collaboration with Dr. Clayborn Bigness as a part of collaborative care agreement.   Eliot Popper R. Valetta Fuller, MSN, FNP-C Internal medicine

## 2022-01-07 DIAGNOSIS — Z79891 Long term (current) use of opiate analgesic: Secondary | ICD-10-CM | POA: Diagnosis not present

## 2022-01-07 DIAGNOSIS — I110 Hypertensive heart disease with heart failure: Secondary | ICD-10-CM | POA: Diagnosis not present

## 2022-01-07 DIAGNOSIS — Z7951 Long term (current) use of inhaled steroids: Secondary | ICD-10-CM | POA: Diagnosis not present

## 2022-01-07 DIAGNOSIS — Z79899 Other long term (current) drug therapy: Secondary | ICD-10-CM | POA: Diagnosis not present

## 2022-01-07 DIAGNOSIS — E119 Type 2 diabetes mellitus without complications: Secondary | ICD-10-CM | POA: Diagnosis not present

## 2022-01-07 DIAGNOSIS — Z7952 Long term (current) use of systemic steroids: Secondary | ICD-10-CM | POA: Diagnosis not present

## 2022-01-07 DIAGNOSIS — Z9181 History of falling: Secondary | ICD-10-CM | POA: Diagnosis not present

## 2022-01-07 DIAGNOSIS — K219 Gastro-esophageal reflux disease without esophagitis: Secondary | ICD-10-CM | POA: Diagnosis not present

## 2022-01-07 DIAGNOSIS — E0500 Thyrotoxicosis with diffuse goiter without thyrotoxic crisis or storm: Secondary | ICD-10-CM | POA: Diagnosis not present

## 2022-01-07 DIAGNOSIS — Z87891 Personal history of nicotine dependence: Secondary | ICD-10-CM | POA: Diagnosis not present

## 2022-01-07 DIAGNOSIS — E039 Hypothyroidism, unspecified: Secondary | ICD-10-CM | POA: Diagnosis not present

## 2022-01-07 DIAGNOSIS — I5022 Chronic systolic (congestive) heart failure: Secondary | ICD-10-CM | POA: Diagnosis not present

## 2022-01-07 DIAGNOSIS — K589 Irritable bowel syndrome without diarrhea: Secondary | ICD-10-CM | POA: Diagnosis not present

## 2022-01-07 DIAGNOSIS — M4800 Spinal stenosis, site unspecified: Secondary | ICD-10-CM | POA: Diagnosis not present

## 2022-01-07 DIAGNOSIS — Z9981 Dependence on supplemental oxygen: Secondary | ICD-10-CM | POA: Diagnosis not present

## 2022-01-07 DIAGNOSIS — M797 Fibromyalgia: Secondary | ICD-10-CM | POA: Diagnosis not present

## 2022-01-07 DIAGNOSIS — F32A Depression, unspecified: Secondary | ICD-10-CM | POA: Diagnosis not present

## 2022-01-07 DIAGNOSIS — J449 Chronic obstructive pulmonary disease, unspecified: Secondary | ICD-10-CM | POA: Diagnosis not present

## 2022-01-13 DIAGNOSIS — K589 Irritable bowel syndrome without diarrhea: Secondary | ICD-10-CM | POA: Diagnosis not present

## 2022-01-13 DIAGNOSIS — K219 Gastro-esophageal reflux disease without esophagitis: Secondary | ICD-10-CM | POA: Diagnosis not present

## 2022-01-13 DIAGNOSIS — F32A Depression, unspecified: Secondary | ICD-10-CM | POA: Diagnosis not present

## 2022-01-13 DIAGNOSIS — Z7951 Long term (current) use of inhaled steroids: Secondary | ICD-10-CM | POA: Diagnosis not present

## 2022-01-13 DIAGNOSIS — J449 Chronic obstructive pulmonary disease, unspecified: Secondary | ICD-10-CM | POA: Diagnosis not present

## 2022-01-13 DIAGNOSIS — M4800 Spinal stenosis, site unspecified: Secondary | ICD-10-CM | POA: Diagnosis not present

## 2022-01-13 DIAGNOSIS — Z79891 Long term (current) use of opiate analgesic: Secondary | ICD-10-CM | POA: Diagnosis not present

## 2022-01-13 DIAGNOSIS — E119 Type 2 diabetes mellitus without complications: Secondary | ICD-10-CM | POA: Diagnosis not present

## 2022-01-13 DIAGNOSIS — M797 Fibromyalgia: Secondary | ICD-10-CM | POA: Diagnosis not present

## 2022-01-13 DIAGNOSIS — I5022 Chronic systolic (congestive) heart failure: Secondary | ICD-10-CM | POA: Diagnosis not present

## 2022-01-13 DIAGNOSIS — Z9981 Dependence on supplemental oxygen: Secondary | ICD-10-CM | POA: Diagnosis not present

## 2022-01-13 DIAGNOSIS — Z7952 Long term (current) use of systemic steroids: Secondary | ICD-10-CM | POA: Diagnosis not present

## 2022-01-13 DIAGNOSIS — I110 Hypertensive heart disease with heart failure: Secondary | ICD-10-CM | POA: Diagnosis not present

## 2022-01-13 DIAGNOSIS — E039 Hypothyroidism, unspecified: Secondary | ICD-10-CM | POA: Diagnosis not present

## 2022-01-13 DIAGNOSIS — E0500 Thyrotoxicosis with diffuse goiter without thyrotoxic crisis or storm: Secondary | ICD-10-CM | POA: Diagnosis not present

## 2022-01-13 DIAGNOSIS — Z87891 Personal history of nicotine dependence: Secondary | ICD-10-CM | POA: Diagnosis not present

## 2022-01-13 DIAGNOSIS — Z9181 History of falling: Secondary | ICD-10-CM | POA: Diagnosis not present

## 2022-01-13 DIAGNOSIS — Z79899 Other long term (current) drug therapy: Secondary | ICD-10-CM | POA: Diagnosis not present

## 2022-01-13 MED ORDER — LORAZEPAM 1 MG PO TABS: 1.0000 mg | ORAL_TABLET | Freq: Two times a day (BID) | ORAL | 2 refills | Status: AC | PRN

## 2022-01-15 DIAGNOSIS — E119 Type 2 diabetes mellitus without complications: Secondary | ICD-10-CM | POA: Diagnosis not present

## 2022-01-15 DIAGNOSIS — I5022 Chronic systolic (congestive) heart failure: Secondary | ICD-10-CM | POA: Diagnosis not present

## 2022-01-15 DIAGNOSIS — I110 Hypertensive heart disease with heart failure: Secondary | ICD-10-CM | POA: Diagnosis not present

## 2022-01-15 DIAGNOSIS — Z79891 Long term (current) use of opiate analgesic: Secondary | ICD-10-CM | POA: Diagnosis not present

## 2022-01-15 DIAGNOSIS — M4800 Spinal stenosis, site unspecified: Secondary | ICD-10-CM | POA: Diagnosis not present

## 2022-01-15 DIAGNOSIS — M797 Fibromyalgia: Secondary | ICD-10-CM | POA: Diagnosis not present

## 2022-01-15 DIAGNOSIS — J449 Chronic obstructive pulmonary disease, unspecified: Secondary | ICD-10-CM | POA: Diagnosis not present

## 2022-01-15 DIAGNOSIS — Z9181 History of falling: Secondary | ICD-10-CM | POA: Diagnosis not present

## 2022-01-15 DIAGNOSIS — E039 Hypothyroidism, unspecified: Secondary | ICD-10-CM | POA: Diagnosis not present

## 2022-01-15 DIAGNOSIS — Z7952 Long term (current) use of systemic steroids: Secondary | ICD-10-CM | POA: Diagnosis not present

## 2022-01-15 DIAGNOSIS — K219 Gastro-esophageal reflux disease without esophagitis: Secondary | ICD-10-CM | POA: Diagnosis not present

## 2022-01-15 DIAGNOSIS — E0500 Thyrotoxicosis with diffuse goiter without thyrotoxic crisis or storm: Secondary | ICD-10-CM | POA: Diagnosis not present

## 2022-01-15 DIAGNOSIS — Z7951 Long term (current) use of inhaled steroids: Secondary | ICD-10-CM | POA: Diagnosis not present

## 2022-01-15 DIAGNOSIS — F32A Depression, unspecified: Secondary | ICD-10-CM | POA: Diagnosis not present

## 2022-01-15 DIAGNOSIS — Z9981 Dependence on supplemental oxygen: Secondary | ICD-10-CM | POA: Diagnosis not present

## 2022-01-15 DIAGNOSIS — Z87891 Personal history of nicotine dependence: Secondary | ICD-10-CM | POA: Diagnosis not present

## 2022-01-15 DIAGNOSIS — K589 Irritable bowel syndrome without diarrhea: Secondary | ICD-10-CM | POA: Diagnosis not present

## 2022-01-15 DIAGNOSIS — Z79899 Other long term (current) drug therapy: Secondary | ICD-10-CM | POA: Diagnosis not present

## 2022-01-20 ENCOUNTER — Ambulatory Visit: Payer: Self-pay

## 2022-01-20 NOTE — Patient Outreach (Signed)
  Care Coordination   Initial Visit Note   01/20/2022 Name: [name] MRN: [id number] DOB: 11/04/1945  [name] is a 76 y.o. year old female who sees Danielle Kuster, NP for primary care. I spoke with  [name] by phone today.  What matters to the patients health and wellness today?  Patient states she is trying to get a new  primary doctor and spoke with someone today that is helping her. Patient was unable to say who was helping her or where they were calling from. She states the person said they would return call. Patient states she has been on the phone for a long time today and she is tired.  RNCM attempted to reschedule call.  Patient states she did not want to wait on the line to schedule.  She requested that another day be picked for a return call.    Goals Addressed             This Visit's Progress    Patient stated: Care coordination activities       Care Coordination Interventions: Actively listened to patient voice concerns Offered to rescheduled telephone visit for later date.           SDOH assessments and interventions completed:  No     Care Coordination Interventions Activated:  Yes  Care Coordination Interventions:  Yes, provided   Follow up plan: Follow up call scheduled for 01/26/22 at 1:00 pm    Encounter Outcome:  Pt. Visit Completed   George Ina RN,BSN,CCM RN Care Manager Coordinator [phone]

## 2022-01-20 NOTE — Patient Instructions (Signed)
Visit Information  Thank you for taking time to visit with me today. Please don't hesitate to contact me if I can be of assistance to you.   Following are the goals we discussed today:   Goals Addressed             This Visit's Progress    Patient stated: Care coordination activities       Care Coordination Interventions: Actively listened to patient voice concerns Offered to rescheduled telephone visit for later date.           Our next appointment is by telephone on 01/26/22 at 1:00 pm  Please call the care guide team at [phone] if you need to cancel or reschedule your appointment.   If you are experiencing a Mental Health or Behavioral Health Crisis or need someone to talk to, please call 1-800-273-TALK (toll free, 24 hour hotline)  The patient verbalized understanding of instructions, educational materials, and care plan provided today and DECLINED offer to receive copy of patient instructions, educational materials, and care plan.   George Ina RN,BSN,CCM RN Care Manager Coordinator [phone]

## 2022-01-26 ENCOUNTER — Ambulatory Visit: Payer: Self-pay

## 2022-01-26 NOTE — Patient Instructions (Signed)
Visit Information  Thank you for taking time to visit with me today. Please don't hesitate to contact me if I can be of assistance to you.   Following are the goals we discussed today:   Goals Addressed             This Visit's Progress    Patient Stated:  I need help learning how to go to sleep at night ( Sleep hygiene)       Care Coordination Interventions: Evaluation of current treatment plan related to patients sleep management and adherence to plan as established by provider Provided written education to patient re: sleep hygiene Reviewed scheduled/upcoming provider appointments  Discussed plans with patient for ongoing care management follow up and provided patient with direct contact information for care management team Tips for good night sleep:  Make your bedroom dark, quiet and comfortable.  Use your bedroom for sleeping not for watching TV or doing work. Go to bed and wake up at the same time everyday, even on weekends.        COMPLETED: Patient stated: Care coordination activities       Care Coordination Interventions: Actively listened to patient voice concerns Offered to rescheduled telephone visit for later date.           Our next appointment is by telephone on 1-/13/23  at 10:00 am  Please call the care guide team at [phone] if you need to cancel or reschedule your appointment.   If you are experiencing a Mental Health or Behavioral Health Crisis or need someone to talk to, please call 1-800-273-TALK (toll free, 24 hour hotline)  The patient verbalized understanding of instructions, educational materials, and care plan provided today and agreed to receive a mailed copy of patient instructions, educational materials, and care plan.   Danielle Ina RN,BSN,CCM [facility] Care Coordination [phone] direct line  Quality Sleep Information, Adult Quality sleep is important for your mental and physical health. It also improves your quality of life. Quality  sleep means you: Are asleep for most of the time you are in bed. Fall asleep within 30 minutes. Wake up no more than once a night. Are awake for no longer than 20 minutes if you do wake up during the night. Most adults need 7-8 hours of quality sleep each night. How can poor sleep affect me? If you do not get enough quality sleep, you may have: Mood swings. Daytime sleepiness. Decreased alertness, reaction time, and concentration. Sleep disorders, such as insomnia and sleep apnea. Difficulty with: Solving problems. Coping with stress. Paying attention. These issues may affect your performance and productivity at work, school, and home. Lack of sleep may also put you at higher risk for accidents, suicide, and risky behaviors. If you do not get quality sleep, you may also be at higher risk for several health problems, including: Infections. Type 2 diabetes. Heart disease. High blood pressure. Obesity. Worsening of long-term conditions, like arthritis, kidney disease, depression, Parkinson's disease, and epilepsy. What actions can I take to get more quality sleep? Sleep schedule and routine Stick to a sleep schedule. Go to sleep and wake up at about the same time each day. Do not try to sleep less on weekdays and make up for lost sleep on weekends. This does not work. Limit naps during the day to 30 minutes or less. Do not take naps in the late afternoon. Make time to relax before bed. Reading, listening to music, or taking a hot bath promotes quality sleep. Make  your bedroom a place that promotes quality sleep. Keep your bedroom dark, quiet, and at a comfortable room temperature. Make sure your bed is comfortable. Avoid using electronic devices that give off bright blue light for 30 minutes before bedtime. Your brain perceives bright blue light as sunlight. This includes television, phones, and computers. If you are lying awake in bed for longer than 20 minutes, get up and do a relaxing  activity until you feel sleepy. Lifestyle     Try to get at least 30 minutes of exercise on most days. Do not exercise 2-3 hours before going to bed. Do not use any products that contain nicotine or tobacco. These products include cigarettes, chewing tobacco, and vaping devices, such as e-cigarettes. If you need help quitting, ask your health care provider. Do not drink caffeinated beverages for at least 8 hours before going to bed. Coffee, tea, and some sodas contain caffeine. Do not drink alcohol or eat large meals close to bedtime. Try to get at least 30 minutes of sunlight every day. Morning sunlight is best. Medical concerns Work with your health care provider to treat medical conditions that may affect sleeping, such as: Nasal obstruction. Snoring. Sleep apnea and other sleep disorders. Talk to your health care provider if you think any of your prescription medicines may cause you to have difficulty falling or staying asleep. If you have sleep problems, talk with a sleep consultant. If you think you have a sleep disorder, talk with your health care provider about getting evaluated by a specialist. Where to find more information Sleep Foundation: sleepfoundation.org American Academy of Sleep Medicine: aasm.org Centers for Disease Control and Prevention (CDC): TonerPromos.no Contact a health care provider if: You have trouble getting to sleep or staying asleep. You often wake up very early in the morning and cannot get back to sleep. You have daytime sleepiness. You have daytime sleep attacks of suddenly falling asleep and sudden muscle weakness (narcolepsy). You have a tingling sensation in your legs with a strong urge to move your legs (restless legs syndrome). You stop breathing briefly during sleep (sleep apnea). You think you have a sleep disorder or are taking a medicine that is affecting your quality of sleep. Summary Most adults need 7-8 hours of quality sleep each night. Getting  enough quality sleep is important for your mental and physical health. Make your bedroom a place that promotes quality sleep, and avoid things that may cause you to have poor sleep, such as alcohol, caffeine, smoking, or large meals. Talk to your health care provider if you have trouble falling asleep or staying asleep. This information is not intended to replace advice given to you by your health care provider. Make sure you discuss any questions you have with your health care provider. Document Revised: 08/27/2021 Document Reviewed: 08/27/2021 Elsevier Patient Education  2023 [employer].

## 2022-01-26 NOTE — Patient Outreach (Signed)
  Care Coordination   Follow Up Visit Note   01/26/2022 Name: [name] MRN: [id number] DOB: [birth year]  [name] is a 76 y.o. year old female who sees Sallyanne Kuster, NP for primary care. I spoke with  [name] by phone today.  What matters to the patients health and wellness today?  Patient states she has changed her primary care provider to Dr. Jerlyn Ly.  She states she has a personal care aid that provides 39 hours of service per week. She states she is receiving physical and occupational therapy with [facility].  She reports her transportation is provided through [employer] group.  Patient reports having a daughter that lives out of the area. She state she talks with her often and sees her at least 1 x per month.   Patient states she wears oxygen 24/7 at 2 L.   Patient states her main concern / goal right now is learning how to go to sleep so that she can get proper rest.     Goals Addressed             This Visit's Progress    Patient Stated:  I need help learning how to go to sleep at night ( Sleep hygiene)       Care Coordination Interventions: Evaluation of current treatment plan related to patients sleep management and adherence to plan as established by provider Provided written education to patient re: sleep hygiene Reviewed scheduled/upcoming provider appointments  Discussed plans with patient for ongoing care management follow up and provided patient with direct contact information for care management team Tips for good night sleep:  Make your bedroom dark, quiet and comfortable.  Use your bedroom for sleeping not for watching TV or doing work. Go to bed and wake up at the same time everyday, even on weekends.        COMPLETED: Patient stated: Care coordination activities       Care Coordination Interventions: Actively listened to patient voice concerns Offered to rescheduled telephone visit for later date.           SDOH  assessments and interventions completed:  Yes  SDOH Interventions Today    Flowsheet Row Most Recent Value  SDOH Interventions   Food Insecurity Interventions Intervention Not Indicated  Housing Interventions Intervention Not Indicated  Transportation Interventions Intervention Not Indicated        Care Coordination Interventions Activated:  Yes  Care Coordination Interventions:  Yes, provided   Follow up plan: Follow up call scheduled for 02/27/22 at 10:00 am    Encounter Outcome:  Pt. Visit Completed   George Ina RN,BSN,CCM [facility] Care Coordination [phone] direct line

## 2022-02-05 ENCOUNTER — Ambulatory Visit: Payer: Medicare Other | Admitting: Cardiology

## 2022-02-16 ENCOUNTER — Ambulatory Visit: Payer: Medicare Other | Attending: Cardiology | Admitting: Cardiology

## 2022-02-16 ENCOUNTER — Telehealth: Payer: Self-pay

## 2022-02-16 NOTE — Telephone Encounter (Signed)
[facility] called [phone] #[phone] that pt change her insurance so they are no longer in her network and they already talk to pt and she will call her insurance right now they are in hold until pt call them back and and I also advised that if they cannot take her pt need appt for new referral

## 2022-02-17 ENCOUNTER — Encounter: Payer: Self-pay | Admitting: Cardiology

## 2022-02-27 ENCOUNTER — Ambulatory Visit: Payer: Self-pay

## 2022-02-27 NOTE — Patient Outreach (Addendum)
  Care Coordination   02/27/2022 Name: [name] MRN: [id number] DOB: 1945/05/30   Care Coordination Outreach Attempts:  An unsuccessful telephone outreach was attempted for a scheduled appointment today. Telephone call attempt to patient x2. Unable to reach patient or leave voice message.  Phone only rang.   Follow Up Plan:  Additional outreach attempts will be made to offer the patient care coordination information and services.   Encounter Outcome:  No Answer  Care Coordination Interventions Activated:  No   Care Coordination Interventions:  No, not indicated    Quinn Plowman RN,BSN,CCM [place] [phone] direct line

## 2022-03-05 ENCOUNTER — Ambulatory Visit: Payer: Self-pay

## 2022-03-05 NOTE — Patient Outreach (Signed)
  Care Coordination   Follow Up Visit Note   03/05/2022 Name: NASIYA PASCUAL MRN: [id number] DOB: [birth year]  INNA TISDELL is a 76 y.o. year old female who sees Jonetta Osgood, NP for primary care. I spoke with  Dorian Heckle by phone today.  What matters to the patients health and wellness today?  Patient states she attempted sleep hygiene advisement without success. Patient reports having office visit with new primary care provider. She states she discussed her sleep concerns with provider and provider states they will work on this going forward.  Patient states she will not restart her home physical therapy  per provider at this time because she is doing well from treatment.  Reviewed goals with patient. Patient verbally agreed goals have been met therefore will be closed to care coordination services.    Goals Addressed             This Visit's Progress    COMPLETED: Patient Stated:  I need help learning how to go to sleep at night ( Sleep hygiene)       Care Coordination Interventions: Confirmed patient attempted sleep hygiene tips provided and received educational material sent to her Confirmed patient established new primary care provider Confirmed patient followed up regarding her home health PT services           SDOH assessments and interventions completed:  No     Care Coordination Interventions Activated:  Yes  Care Coordination Interventions:  Yes, provided   Follow up plan: No further intervention required.   Encounter Outcome:  Pt. Visit Completed   Quinn Plowman RN,BSN,CCM [place] [phone] direct line

## 2022-03-08 ENCOUNTER — Encounter: Payer: Self-pay | Admitting: Nurse Practitioner

## 2022-04-03 ENCOUNTER — Ambulatory Visit: Payer: Medicare Other | Admitting: Nurse Practitioner

## 2022-06-09 ENCOUNTER — Ambulatory Visit: Payer: 59 | Admitting: Nurse Practitioner

## 2022-06-26 ENCOUNTER — Other Ambulatory Visit: Payer: Self-pay | Admitting: Nurse Practitioner

## 2022-06-26 DIAGNOSIS — J449 Chronic obstructive pulmonary disease, unspecified: Secondary | ICD-10-CM

## 2022-06-26 NOTE — Telephone Encounter (Signed)
Please review before refused

## 2022-06-30 ENCOUNTER — Ambulatory Visit: Payer: Medicare Other | Admitting: Nurse Practitioner

## 2022-08-27 ENCOUNTER — Other Ambulatory Visit: Payer: Self-pay | Admitting: Family Medicine

## 2022-08-27 DIAGNOSIS — M810 Age-related osteoporosis without current pathological fracture: Secondary | ICD-10-CM

## 2022-09-14 ENCOUNTER — Other Ambulatory Visit: Payer: Medicare Other

## 2022-09-19 ENCOUNTER — Other Ambulatory Visit: Payer: Self-pay | Admitting: Nurse Practitioner

## 2022-09-19 DIAGNOSIS — Z760 Encounter for issue of repeat prescription: Secondary | ICD-10-CM

## 2022-09-21 NOTE — Telephone Encounter (Signed)
Last in 8/23 no next and 4 no show

## 2022-09-23 ENCOUNTER — Telehealth: Payer: Self-pay | Admitting: Nurse Practitioner

## 2022-09-23 NOTE — Telephone Encounter (Signed)
Attempted to contact patient @ [phone] to schedule appointment to continue med refills. Call would not go through. Lvm with her daughter, Elon Jester, to either return my call to have patient call office-Toni

## 2022-09-24 ENCOUNTER — Telehealth: Payer: Self-pay | Admitting: Internal Medicine

## 2022-09-24 NOTE — Telephone Encounter (Signed)
Appointment letter mailed to patient-[name]

## 2022-10-05 ENCOUNTER — Other Ambulatory Visit: Payer: Self-pay | Admitting: Nurse Practitioner

## 2022-10-05 DIAGNOSIS — Z760 Encounter for issue of repeat prescription: Secondary | ICD-10-CM

## 2022-10-14 ENCOUNTER — Telehealth: Payer: Self-pay | Admitting: Internal Medicine

## 2022-10-14 NOTE — Telephone Encounter (Signed)
Patient did not respond to voice mails or letter. Discharged. Letter sent-Toni

## 2022-12-14 ENCOUNTER — Other Ambulatory Visit: Payer: 59

## 2023-06-17 ENCOUNTER — Other Ambulatory Visit: Payer: 59
